# Patient Record
Sex: Male | Born: 1950 | Race: Black or African American | Hispanic: No | Smoking: Former smoker
Health system: Southern US, Community
[De-identification: ages and names within clinical notes are randomized; demographics above are authoritative.]

## PROBLEM LIST (undated history)

## (undated) DIAGNOSIS — C801 Malignant (primary) neoplasm, unspecified: Secondary | ICD-10-CM

## (undated) DIAGNOSIS — N400 Enlarged prostate without lower urinary tract symptoms: Secondary | ICD-10-CM

## (undated) DIAGNOSIS — E559 Vitamin D deficiency, unspecified: Secondary | ICD-10-CM

## (undated) DIAGNOSIS — J432 Centrilobular emphysema: Secondary | ICD-10-CM

## (undated) DIAGNOSIS — K279 Peptic ulcer, site unspecified, unspecified as acute or chronic, without hemorrhage or perforation: Secondary | ICD-10-CM

## (undated) DIAGNOSIS — Z8719 Personal history of other diseases of the digestive system: Secondary | ICD-10-CM

## (undated) DIAGNOSIS — E785 Hyperlipidemia, unspecified: Secondary | ICD-10-CM

## (undated) DIAGNOSIS — E669 Obesity, unspecified: Secondary | ICD-10-CM

## (undated) DIAGNOSIS — F419 Anxiety disorder, unspecified: Secondary | ICD-10-CM

## (undated) DIAGNOSIS — M51369 Other intervertebral disc degeneration, lumbar region without mention of lumbar back pain or lower extremity pain: Secondary | ICD-10-CM

## (undated) DIAGNOSIS — K219 Gastro-esophageal reflux disease without esophagitis: Secondary | ICD-10-CM

## (undated) DIAGNOSIS — I499 Cardiac arrhythmia, unspecified: Secondary | ICD-10-CM

## (undated) DIAGNOSIS — R9439 Abnormal result of other cardiovascular function study: Secondary | ICD-10-CM

## (undated) DIAGNOSIS — E790 Hyperuricemia without signs of inflammatory arthritis and tophaceous disease: Secondary | ICD-10-CM

## (undated) DIAGNOSIS — J45909 Unspecified asthma, uncomplicated: Secondary | ICD-10-CM

## (undated) DIAGNOSIS — R918 Other nonspecific abnormal finding of lung field: Secondary | ICD-10-CM

## (undated) DIAGNOSIS — Z87891 Personal history of nicotine dependence: Secondary | ICD-10-CM

## (undated) DIAGNOSIS — R0600 Dyspnea, unspecified: Secondary | ICD-10-CM

## (undated) DIAGNOSIS — R972 Elevated prostate specific antigen [PSA]: Secondary | ICD-10-CM

## (undated) DIAGNOSIS — A048 Other specified bacterial intestinal infections: Secondary | ICD-10-CM

## (undated) DIAGNOSIS — I2510 Atherosclerotic heart disease of native coronary artery without angina pectoris: Secondary | ICD-10-CM

## (undated) DIAGNOSIS — E876 Hypokalemia: Secondary | ICD-10-CM

## (undated) DIAGNOSIS — I519 Heart disease, unspecified: Secondary | ICD-10-CM

## (undated) DIAGNOSIS — N289 Disorder of kidney and ureter, unspecified: Secondary | ICD-10-CM

## (undated) DIAGNOSIS — E119 Type 2 diabetes mellitus without complications: Secondary | ICD-10-CM

## (undated) DIAGNOSIS — G629 Polyneuropathy, unspecified: Secondary | ICD-10-CM

## (undated) DIAGNOSIS — C3491 Malignant neoplasm of unspecified part of right bronchus or lung: Secondary | ICD-10-CM

## (undated) DIAGNOSIS — R600 Localized edema: Secondary | ICD-10-CM

## (undated) DIAGNOSIS — I10 Essential (primary) hypertension: Secondary | ICD-10-CM

## (undated) DIAGNOSIS — D126 Benign neoplasm of colon, unspecified: Secondary | ICD-10-CM

## (undated) DIAGNOSIS — I351 Nonrheumatic aortic (valve) insufficiency: Secondary | ICD-10-CM

## (undated) DIAGNOSIS — M5136 Other intervertebral disc degeneration, lumbar region: Secondary | ICD-10-CM

## (undated) DIAGNOSIS — M87059 Idiopathic aseptic necrosis of unspecified femur: Secondary | ICD-10-CM

## (undated) DIAGNOSIS — J90 Pleural effusion, not elsewhere classified: Secondary | ICD-10-CM

## (undated) DIAGNOSIS — K769 Liver disease, unspecified: Secondary | ICD-10-CM

## (undated) DIAGNOSIS — R1310 Dysphagia, unspecified: Secondary | ICD-10-CM

## (undated) DIAGNOSIS — G709 Myoneural disorder, unspecified: Secondary | ICD-10-CM

## (undated) DIAGNOSIS — H269 Unspecified cataract: Secondary | ICD-10-CM

## (undated) DIAGNOSIS — K429 Umbilical hernia without obstruction or gangrene: Secondary | ICD-10-CM

## (undated) DIAGNOSIS — K2980 Duodenitis without bleeding: Secondary | ICD-10-CM

## (undated) DIAGNOSIS — N529 Male erectile dysfunction, unspecified: Secondary | ICD-10-CM

## (undated) DIAGNOSIS — J449 Chronic obstructive pulmonary disease, unspecified: Secondary | ICD-10-CM

## (undated) DIAGNOSIS — K2970 Gastritis, unspecified, without bleeding: Secondary | ICD-10-CM

## (undated) DIAGNOSIS — K5790 Diverticulosis of intestine, part unspecified, without perforation or abscess without bleeding: Secondary | ICD-10-CM

## (undated) HISTORY — PX: CYST REMOVAL TRUNK: SHX6283

## (undated) HISTORY — DX: Myoneural disorder, unspecified: G70.9

## (undated) HISTORY — PX: CYSTECTOMY: SHX5119

## (undated) HISTORY — PX: EYE SURGERY: SHX253

## (undated) HISTORY — PX: COLONOSCOPY: SHX174

## (undated) HISTORY — PX: UPPER GI ENDOSCOPY: SHX6162

## (undated) HISTORY — PX: OTHER SURGICAL HISTORY: SHX169

## (undated) HISTORY — DX: Unspecified cataract: H26.9

## (undated) HISTORY — PX: CATARACT EXTRACTION: SUR2

---

## 2009-03-03 ENCOUNTER — Ambulatory Visit: Payer: Self-pay | Admitting: Unknown Physician Specialty

## 2013-02-25 ENCOUNTER — Ambulatory Visit: Payer: Self-pay | Admitting: Physician Assistant

## 2013-03-04 ENCOUNTER — Ambulatory Visit: Payer: Self-pay | Admitting: Family Medicine

## 2013-03-09 ENCOUNTER — Ambulatory Visit: Payer: Self-pay

## 2013-04-22 ENCOUNTER — Ambulatory Visit: Payer: Self-pay | Admitting: Family Medicine

## 2013-04-29 ENCOUNTER — Ambulatory Visit: Payer: Self-pay | Admitting: Family Medicine

## 2013-12-01 ENCOUNTER — Ambulatory Visit: Payer: Self-pay | Admitting: Internal Medicine

## 2014-01-20 DIAGNOSIS — N529 Male erectile dysfunction, unspecified: Secondary | ICD-10-CM | POA: Insufficient documentation

## 2014-01-20 DIAGNOSIS — K279 Peptic ulcer, site unspecified, unspecified as acute or chronic, without hemorrhage or perforation: Secondary | ICD-10-CM | POA: Insufficient documentation

## 2014-01-20 DIAGNOSIS — I519 Heart disease, unspecified: Secondary | ICD-10-CM | POA: Insufficient documentation

## 2014-01-20 DIAGNOSIS — I351 Nonrheumatic aortic (valve) insufficiency: Secondary | ICD-10-CM | POA: Insufficient documentation

## 2014-01-20 DIAGNOSIS — Z87891 Personal history of nicotine dependence: Secondary | ICD-10-CM | POA: Insufficient documentation

## 2014-01-20 DIAGNOSIS — I499 Cardiac arrhythmia, unspecified: Secondary | ICD-10-CM | POA: Insufficient documentation

## 2014-03-06 DIAGNOSIS — Z800 Family history of malignant neoplasm of digestive organs: Secondary | ICD-10-CM | POA: Insufficient documentation

## 2014-03-06 DIAGNOSIS — K219 Gastro-esophageal reflux disease without esophagitis: Secondary | ICD-10-CM | POA: Insufficient documentation

## 2014-03-06 DIAGNOSIS — E1159 Type 2 diabetes mellitus with other circulatory complications: Secondary | ICD-10-CM | POA: Insufficient documentation

## 2014-03-06 DIAGNOSIS — E1169 Type 2 diabetes mellitus with other specified complication: Secondary | ICD-10-CM | POA: Insufficient documentation

## 2014-03-06 DIAGNOSIS — E785 Hyperlipidemia, unspecified: Secondary | ICD-10-CM | POA: Insufficient documentation

## 2014-03-06 DIAGNOSIS — I10 Essential (primary) hypertension: Secondary | ICD-10-CM | POA: Insufficient documentation

## 2014-04-02 DIAGNOSIS — D126 Benign neoplasm of colon, unspecified: Secondary | ICD-10-CM | POA: Insufficient documentation

## 2014-04-02 DIAGNOSIS — R1310 Dysphagia, unspecified: Secondary | ICD-10-CM | POA: Insufficient documentation

## 2014-04-29 ENCOUNTER — Ambulatory Visit: Payer: Self-pay | Admitting: Unknown Physician Specialty

## 2014-07-10 ENCOUNTER — Emergency Department: Payer: Self-pay | Admitting: Emergency Medicine

## 2014-07-10 LAB — CBC WITH DIFFERENTIAL/PLATELET
BASOS PCT: 0.5 %
Basophil #: 0.1 10*3/uL (ref 0.0–0.1)
EOS PCT: 1 %
Eosinophil #: 0.1 10*3/uL (ref 0.0–0.7)
HCT: 47.2 % (ref 40.0–52.0)
HGB: 15.8 g/dL (ref 13.0–18.0)
Lymphocyte #: 2 10*3/uL (ref 1.0–3.6)
Lymphocyte %: 19.8 %
MCH: 31.2 pg (ref 26.0–34.0)
MCHC: 33.6 g/dL (ref 32.0–36.0)
MCV: 93 fL (ref 80–100)
Monocyte #: 0.8 x10 3/mm (ref 0.2–1.0)
Monocyte %: 8.1 %
NEUTROS ABS: 7 10*3/uL — AB (ref 1.4–6.5)
Neutrophil %: 70.6 %
Platelet: 236 10*3/uL (ref 150–440)
RBC: 5.08 10*6/uL (ref 4.40–5.90)
RDW: 13.9 % (ref 11.5–14.5)
WBC: 9.9 10*3/uL (ref 3.8–10.6)

## 2014-07-10 LAB — URINALYSIS, COMPLETE
Bacteria: NONE SEEN
Bilirubin,UR: NEGATIVE
GLUCOSE, UR: NEGATIVE mg/dL (ref 0–75)
Ketone: NEGATIVE
LEUKOCYTE ESTERASE: NEGATIVE
NITRITE: NEGATIVE
PROTEIN: NEGATIVE
Ph: 5 (ref 4.5–8.0)
RBC,UR: 1 /HPF (ref 0–5)
SPECIFIC GRAVITY: 1.012 (ref 1.003–1.030)
Squamous Epithelial: NONE SEEN

## 2014-07-10 LAB — COMPREHENSIVE METABOLIC PANEL
ALT: 19 U/L
ANION GAP: 6 — AB (ref 7–16)
Albumin: 3.4 g/dL (ref 3.4–5.0)
Alkaline Phosphatase: 60 U/L
BUN: 14 mg/dL (ref 7–18)
Bilirubin,Total: 0.5 mg/dL (ref 0.2–1.0)
Calcium, Total: 8.9 mg/dL (ref 8.5–10.1)
Chloride: 101 mmol/L (ref 98–107)
Co2: 31 mmol/L (ref 21–32)
Creatinine: 1.16 mg/dL (ref 0.60–1.30)
EGFR (African American): >60
EGFR (Non-African Amer.): >60
GLUCOSE: 164 mg/dL — AB (ref 65–99)
Osmolality: 280 (ref 275–301)
POTASSIUM: 3.1 mmol/L — AB (ref 3.5–5.1)
SGOT(AST): 22 U/L (ref 15–37)
Sodium: 138 mmol/L (ref 136–145)
TOTAL PROTEIN: 7.4 g/dL (ref 6.4–8.2)

## 2014-07-10 LAB — TROPONIN I

## 2014-10-25 LAB — SURGICAL PATHOLOGY

## 2014-11-11 DIAGNOSIS — I2510 Atherosclerotic heart disease of native coronary artery without angina pectoris: Secondary | ICD-10-CM

## 2014-11-11 HISTORY — DX: Atherosclerotic heart disease of native coronary artery without angina pectoris: I25.10

## 2014-12-16 ENCOUNTER — Ambulatory Visit
Admission: RE | Admit: 2014-12-16 | Discharge: 2014-12-16 | Disposition: A | Discharge status: Home / Self Care | Payer: BLUE CROSS/BLUE SHIELD | Source: Ambulatory Visit | Attending: Cardiology | Admitting: Cardiology

## 2014-12-16 ENCOUNTER — Encounter
Admission: RE | Disposition: A | Discharge status: Home / Self Care | Payer: Self-pay | Source: Ambulatory Visit | Attending: Cardiology

## 2014-12-16 ENCOUNTER — Encounter: Payer: Self-pay | Admitting: *Deleted

## 2014-12-16 DIAGNOSIS — I2510 Atherosclerotic heart disease of native coronary artery without angina pectoris: Secondary | ICD-10-CM | POA: Diagnosis not present

## 2014-12-16 DIAGNOSIS — F17210 Nicotine dependence, cigarettes, uncomplicated: Secondary | ICD-10-CM | POA: Insufficient documentation

## 2014-12-16 DIAGNOSIS — K219 Gastro-esophageal reflux disease without esophagitis: Secondary | ICD-10-CM | POA: Insufficient documentation

## 2014-12-16 DIAGNOSIS — E785 Hyperlipidemia, unspecified: Secondary | ICD-10-CM | POA: Insufficient documentation

## 2014-12-16 DIAGNOSIS — Z791 Long term (current) use of non-steroidal anti-inflammatories (NSAID): Secondary | ICD-10-CM | POA: Insufficient documentation

## 2014-12-16 DIAGNOSIS — E780 Pure hypercholesterolemia: Secondary | ICD-10-CM | POA: Insufficient documentation

## 2014-12-16 DIAGNOSIS — I10 Essential (primary) hypertension: Secondary | ICD-10-CM | POA: Insufficient documentation

## 2014-12-16 DIAGNOSIS — Z79899 Other long term (current) drug therapy: Secondary | ICD-10-CM | POA: Diagnosis not present

## 2014-12-16 DIAGNOSIS — Z7952 Long term (current) use of systemic steroids: Secondary | ICD-10-CM | POA: Insufficient documentation

## 2014-12-16 DIAGNOSIS — R9439 Abnormal result of other cardiovascular function study: Secondary | ICD-10-CM

## 2014-12-16 DIAGNOSIS — R079 Chest pain, unspecified: Secondary | ICD-10-CM

## 2014-12-16 DIAGNOSIS — I351 Nonrheumatic aortic (valve) insufficiency: Secondary | ICD-10-CM | POA: Diagnosis not present

## 2014-12-16 DIAGNOSIS — Z7982 Long term (current) use of aspirin: Secondary | ICD-10-CM | POA: Diagnosis not present

## 2014-12-16 HISTORY — DX: Essential (primary) hypertension: I10

## 2014-12-16 HISTORY — PX: CARDIAC CATHETERIZATION: SHX172

## 2014-12-16 HISTORY — DX: Gastro-esophageal reflux disease without esophagitis: K21.9

## 2014-12-16 HISTORY — DX: Cardiac arrhythmia, unspecified: I49.9

## 2014-12-16 SURGERY — LEFT HEART CATH
Anesthesia: Moderate Sedation

## 2014-12-16 MED ORDER — ACETAMINOPHEN 325 MG PO TABS: 650.0000 mg | ORAL_TABLET | ORAL | Status: DC | PRN

## 2014-12-16 MED ORDER — IOHEXOL 300 MG/ML  SOLN
INTRAMUSCULAR | Status: DC | PRN
  Administered 2014-12-16: 30 mL via INTRAVENOUS
  Administered 2014-12-16: 80 mL via INTRAVENOUS

## 2014-12-16 MED ORDER — SODIUM CHLORIDE 0.9 % IV SOLN
INTRAVENOUS | Status: DC
  Administered 2014-12-16: 09:00:00 via INTRAVENOUS

## 2014-12-16 MED ORDER — FENTANYL CITRATE (PF) 100 MCG/2ML IJ SOLN
INTRAMUSCULAR | Status: DC | PRN
  Administered 2014-12-16: 50 ug via INTRAVENOUS

## 2014-12-16 MED ORDER — MIDAZOLAM HCL 2 MG/2ML IJ SOLN
INTRAMUSCULAR | Status: DC | PRN
Start: 2014-12-16 — End: 2014-12-16
  Administered 2014-12-16: 2 mg via INTRAVENOUS

## 2014-12-16 MED ORDER — ONDANSETRON HCL 4 MG/2ML IJ SOLN: 4.0000 mg | Freq: Four times a day (QID) | INTRAMUSCULAR | Status: DC | PRN

## 2014-12-16 MED ORDER — MIDAZOLAM HCL 2 MG/2ML IJ SOLN
INTRAMUSCULAR | Status: AC
  Filled 2014-12-16: qty 2

## 2014-12-16 MED ORDER — FENTANYL CITRATE (PF) 100 MCG/2ML IJ SOLN
INTRAMUSCULAR | Status: AC
  Filled 2014-12-16: qty 2

## 2014-12-16 SURGICAL SUPPLY — 9 items
CATH INFINITI 5FR ANG PIGTAIL (CATHETERS) ×3 IMPLANT
CATH INFINITI 5FR JL4 (CATHETERS) ×3 IMPLANT
CATH INFINITI JR4 5F (CATHETERS) ×3 IMPLANT
DEVICE CLOSURE MYNXGRIP 5F (Vascular Products) ×3 IMPLANT
KIT MANI 3VAL PERCEP (MISCELLANEOUS) ×3 IMPLANT
NEEDLE PERC 18GX7CM (NEEDLE) ×3 IMPLANT
PACK CARDIAC CATH (CUSTOM PROCEDURE TRAY) ×3 IMPLANT
SHEATH AVANTI 5FR X 11CM (SHEATH) ×3 IMPLANT
WIRE EMERALD 3MM-J .035X150CM (WIRE) ×3 IMPLANT

## 2014-12-16 NOTE — Discharge Instructions (Signed)

## 2014-12-21 NOTE — H&P (Signed)
Progress Notes   Mark Mccarty (MR# [id number])        Progress Notes Info     Author Note Status Last Update User Last Update Date/Time Service Date   Flossie Dibble, MD Signed Flossie Dibble, MD Wed Nov 24, 2014 2:55 PM Wed Nov 24, 2014 2:48 PM    Progress Notes    Expand All Collapse All   Established Patient Visit   Chief Complaint: Chief Complaint  Patient presents with  . Pacer-ICD    ERI 11/09/14   . Shortness of Breath    used to smoke    Date of Service: 11/24/2014 Date of Birth: 01/22/1936 PCP: DAVID Brandy Hale, MD  History of Present Illness: 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 The patient currently has symptoms including dyspnea and fatigue. Patient appears euvolemic.. We have discussed risk factor modification as well as medication management Defib interrogation The patient has had a pacemaker/Defib interrogation which has shown that the battery has reached end of life. We have had further discussion of battery change out with all of the risks and benefits. Otherwise the patient has not had any significant side effects or symptoms of the defib wires. Coronary artery disease The patient has coronary artery disease previously diagnosed by imaging study currently on appropriate medication management including beta-blocker and statin therapy for risk factor reduction including age, male, diabetes, HTN and Hyperlipidemia. The patient appears not to have any significant side effects of treatment at this time and appears stable at this time without evidence of clinical progression of symptoms  Mitral valve disease The patient has  non rheumatic chronic moderate symptomatic mitral valve insufficiency with symptoms including dyspnea and fatigue on treatment including beta-blocker, statin therapy and ASA for treatment of cardiac risk factors worsening with increased severity without need in change in treatment. These mitral valve disease risk factors include hypertension and cardiomyopathy      Past Medical and Surgical History  Past Medical History Past Medical History  Diagnosis Date  . Coronary artery disease   . Cardiomyopathy   . Hypertension   . Hyperlipidemia   . Lower extremity edema   . Diabetes mellitus   . Anemia   . GERD (gastroesophageal reflux disease)   . CHF (congestive heart failure)   . Prostatic stone   . RLS (restless legs syndrome)   . Varicose veins of lower extremity     Past Surgical History He has past surgical history that includes Cardiac catheterization; defobrillator (N/A); TURP vaporization; Insertion Dual Chamber Pacemaker Generator (06/2008); laminectomy cervical for drez; Colonoscopy (05/20/92; 01/13/98; 04/01/03; 04/22/08); egd (02/13/99; 04/22/08); Cholecystectomy (1997); Colonoscopy (01/28/2012); and egd (07/15/2014).   Medications and Allergies  Current Medications   Current Medications    Current Outpatient Prescriptions  Medication Sig Dispense Refill  . ADVOCATE REDI-CODE+ test strip     . aspirin 81 MG EC tablet Take 81 mg by mouth once daily.    [name] atorvastatin (LIPITOR) 10 MG tablet Take 1 tablet (10 mg total) by mouth once daily. 30 tablet 11  . glipiZIDE (GLUCOTROL XL) 10 MG XL tablet Take 1 tablet (10 mg total) by mouth 2 (two) times daily. 180 tablet 1  . HYDROcodone-acetaminophen (NORCO) 5-325 mg tablet Take 1-2 tablets by mouth  twice daily as needed for pain 45 tablet 0  . lancets 30 gauge Misc     . metFORMIN (GLUCOPHAGE) 850 MG tablet Take 1 tablet (850 mg total) by mouth 2 (two)  times daily with meals. 180 tablet 0  . metoprolol succinate (TOPROL-XL) 25 MG XL tablet Take 0.5 tablets (12.5 mg total) by mouth once daily. 30 tablet 0  . omeprazole (PRILOSEC) 20 MG DR capsule Take 1 capsule (20 mg total) by mouth once daily. 30 capsule 11  . pramipexole (MIRAPEX) 0.125 MG tablet Take 1 tablet (0.125 mg total) by mouth 3 (three) times daily. 30 tablet 0  . sildenafil (VIAGRA) 100 MG tablet Take 100 mg by mouth as directed for Erectile Dysfunction.    [name] spironolactone (ALDACTONE) 25 MG tablet Take 1 tablet (25 mg total) by mouth once daily. 90 tablet 0   No current facility-administered medications for this visit.      Allergies: Penicillins  Social and Family History  Social History  reports that he has never smoked. He does not have any smokeless tobacco history on file. He reports that he does not drink alcohol or use illicit drugs.  Family History Family History  Problem Relation Age of Onset  . Asthma Mother   . Heart attack Mother   . Diabetes type II Father   . Asthma Sister   . Asthma Brother   . Diabetes type II Brother   . Asthma Sister   . Heart disease Sister     Review of Systems   Review of Systems  Positive for sob Negative for weight gain weight loss,weakness, vision change, hearing loss, cough, congestion, PND, orthopnea, heartburn, nausea, diaphoresis, vomiting, diarrhea, bloody stool, melena, stomach pain, extremity pain, leg weakness, buttock cramping, leg blood clots, headache, blackouts, nosebleed, trouble swallowing, mouth pain, urinary frequency, urination at night, muscle weakness, skin lesions, skin rashes, tingling , numbness, anxiety, and/or depression Physical Examination   Vitals:BP 110/78 mmHg  Pulse 70  Resp 15  Ht 175.3 cm ('5\' 9"'$ )  Wt 75.751 kg (167 lb)  BMI 24.65 kg/m2 Ht:175.3 cm ('5\' 9"'$ ) Wt:75.751 kg (167 lb) IDP:OEUM surface area is 1.92 meters  squared. Body mass index is 24.65 kg/(m^2). Appearance: well appearing in no acute distress HEENT: Pupils equally reactive to light and accomodation, no apparent xanthalasma  Neck: Supple, no apparent thyromegaly, and or masses Lungs: normal respiratory effort; no wheezes, no rhonchi, no crackles Heart: Regular rate and rhythm. Normal S1 S2 No gallops, murmur, no rub, PMI is normal. carotid upstroke normal without bruit. Jugular venous pressure is normal Abdomen: soft, nontender, with normal bowel sounds. No apparent hepatosplenomegally. Abdominal aorta is normal  Extremities: no edema, no ulcers, no clubbing, no cyanosis Peripheral Pulses: 2+ in upper extremities, 1+ femoral pulses bilaterally, 2+lower extremity  Musculoskeletal; Normal muscle tone without kyphosis Neurological: Cranial nerves intact, oriented to time, place, person  Assessment   64 y.o. male with  Encounter Diagnoses  Name Primary?  . Coronary artery disease involving native coronary artery of native heart without angina pectoris Yes  . Chronic systolic CHF (congestive heart failure), NYHA class 2   . ICD (implantable cardioverter-defibrillator) in place   . Moderate mitral insufficiency         Plan   -The patient is to have consultation and defib battery placement for defib being ERI. The patient understands all risks and benefits of battery replacement. This includes the possibility of death, stroke, heart attack, hemopericardium, pneumothorax, infection, bleeding, blood clot,  and reaction to medications. The patient is at low risk for general anesthesia -Echocardiogram for further evaluation of dyspnea with congestive heart failure, cardiomyopathy, coronary artery disease, valvular heart disease, cardiomegally and pulmonary hypertension  -Continue current medical regimen for hypertension control which is stable at this time and without side effects or symptoms of medications.  Further treatment goals of low sodium diet for additive effects of these medications have been discussed today as well.     No orders of the defined types were placed in this encounter.   No Follow-up on file.  Flossie Dibble, [facility]   [address] [address]    Service Location    Name Address       [place] [address]      Department    Name Address Phone Fax   [facility] [place]  [address] [phone]       Progress Notes   Mark Mccarty (MR# [id number])        Progress Notes Info     Author Note Status Last Update User Last Update Date/Time Service Date   Flossie Dibble, MD Signed Flossie Dibble, MD Wed Nov 24, 2014 2:55 PM Wed Nov 24, 2014 2:48 PM    Progress Notes    Expand All Collapse All   Established Patient Visit   Chief Complaint: Chief Complaint  Patient presents with  . Pacer-ICD    ERI 11/09/14   . Shortness of Breath    used to smoke    Date of Service: 11/24/2014 Date of Birth: 01/22/1936 PCP: DAVID Brandy Hale, MD  History of Present Illness: 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 The patient currently has symptoms including dyspnea and fatigue. Patient appears euvolemic.. We have discussed risk factor modification as well as medication management Defib interrogation The patient has had a pacemaker/Defib interrogation which has shown that the battery has reached end of life. We have had further discussion of battery change out with all of the  risks and benefits. Otherwise the patient has not had any significant side effects or symptoms of the defib wires. Coronary artery disease The patient has coronary artery disease previously diagnosed by imaging study currently on appropriate medication management including beta-blocker and statin therapy for risk factor reduction including age, male, diabetes, HTN and Hyperlipidemia. 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 These mitral valve disease risk factors include hypertension and cardiomyopathy      Past Medical and Surgical History  Past Medical History Past Medical History  Diagnosis Date  . Coronary artery disease   . Cardiomyopathy   . Hypertension   . Hyperlipidemia   . Lower extremity edema   . Diabetes mellitus   .  Anemia   . GERD (gastroesophageal reflux disease)   . CHF (congestive heart failure)   . Prostatic stone   . RLS (restless legs syndrome)   . Varicose veins of lower extremity     Past Surgical History He has past surgical history that includes Cardiac catheterization; defobrillator (N/A); TURP vaporization; Insertion Dual Chamber Pacemaker Generator (06/2008); laminectomy cervical for drez; Colonoscopy (05/20/92; 01/13/98; 04/01/03; 04/22/08); egd (02/13/99; 04/22/08); Cholecystectomy (1997); Colonoscopy (01/28/2012); and egd (07/15/2014).   Medications and Allergies  Current Medications   Current Medications    Current Outpatient Prescriptions  Medication Sig  Dispense Refill  . ADVOCATE REDI-CODE+ test strip     . aspirin 81 MG EC tablet Take 81 mg by mouth once daily.    [name] atorvastatin (LIPITOR) 10 MG tablet Take 1 tablet (10 mg total) by mouth once daily. 30 tablet 11  . glipiZIDE (GLUCOTROL XL) 10 MG XL tablet Take 1 tablet (10 mg total) by mouth 2 (two) times daily. 180 tablet 1  . HYDROcodone-acetaminophen (NORCO) 5-325 mg tablet Take 1-2 tablets by mouth twice daily as needed for pain 45 tablet 0  . lancets 30 gauge Misc     . metFORMIN (GLUCOPHAGE) 850 MG tablet Take 1 tablet (850 mg total) by mouth 2 (two) times daily with meals. 180 tablet 0  . metoprolol succinate (TOPROL-XL) 25 MG XL tablet Take 0.5 tablets (12.5 mg total) by mouth once daily. 30 tablet 0  . omeprazole (PRILOSEC) 20 MG DR capsule Take 1 capsule (20 mg total) by mouth once daily. 30 capsule 11  . pramipexole (MIRAPEX) 0.125 MG tablet Take 1 tablet (0.125 mg total) by mouth 3 (three) times daily. 30 tablet 0  . sildenafil (VIAGRA) 100 MG tablet Take 100 mg by mouth as directed for Erectile Dysfunction.    [name] spironolactone (ALDACTONE) 25 MG tablet Take 1 tablet (25 mg total) by mouth once daily. 90 tablet 0   No current facility-administered medications for this visit.      Allergies: Penicillins  Social and Family History  Social History  reports that he has never smoked. He does not have any smokeless tobacco history on file. He reports that he does not drink alcohol or use illicit drugs.  Family History Family History  Problem Relation Age of Onset  . Asthma Mother   . Heart attack Mother   . Diabetes type II Father   . Asthma Sister   . Asthma Brother   . Diabetes type II Brother   . Asthma Sister   . Heart disease Sister     Review of Systems   Review of Systems  Positive for sob Negative for weight gain weight loss,weakness, vision change,  hearing loss, cough, congestion, PND, orthopnea, heartburn, nausea, diaphoresis, vomiting, diarrhea, bloody stool, melena, stomach pain, extremity pain, leg weakness, buttock cramping, leg blood clots, headache, blackouts, nosebleed, trouble swallowing, mouth pain, urinary frequency, urination at night, muscle weakness, skin lesions, skin rashes, tingling , numbness, anxiety, and/or depression Physical Examination   Vitals:BP 110/78 mmHg  Pulse 70  Resp 15  Ht 175.3 cm ('5\' 9"'$ )  Wt 75.751 kg (167 lb)  BMI 24.65 kg/m2 Ht:175.3 cm ('5\' 9"'$ ) Wt:75.751 kg (167 lb) WJX:BJYN surface area is 1.92 meters squared. Body mass index is 24.65 kg/(m^2). Appearance: well appearing in no acute distress HEENT: Pupils equally reactive to light and accomodation, no apparent xanthalasma  Neck: Supple, no apparent thyromegaly, and or masses Lungs: normal respiratory effort; no wheezes, no rhonchi, no  crackles Heart: Regular rate and rhythm. Normal S1 S2 No gallops, murmur, no rub, PMI is normal. carotid upstroke normal without bruit. Jugular venous pressure is normal Abdomen: soft, nontender, with normal bowel sounds. No apparent hepatosplenomegally. Abdominal aorta is normal  Extremities: no edema, no ulcers, no clubbing, no cyanosis Peripheral Pulses: 2+ in upper extremities, 1+ femoral pulses bilaterally, 2+lower extremity  Musculoskeletal; Normal muscle tone without kyphosis Neurological: Cranial nerves intact, oriented to time, place, person  Assessment   64 y.o. male with  Encounter Diagnoses  Name Primary?  . Coronary artery disease involving native coronary artery of native heart without angina pectoris Yes  . Chronic systolic CHF (congestive heart failure), NYHA class 2   . ICD (implantable cardioverter-defibrillator) in place   . Moderate mitral insufficiency         Plan   -The patient is to have consultation and defib battery placement for defib being ERI.  The patient understands all risks and benefits of battery replacement. This includes the possibility of death, stroke, heart attack, hemopericardium, pneumothorax, infection, bleeding, blood clot, and reaction to medications. The patient is at low risk for general anesthesia -Echocardiogram for further evaluation of dyspnea with congestive heart failure, cardiomyopathy, coronary artery disease, valvular heart disease, cardiomegally and pulmonary hypertension  -Continue current medical regimen for hypertension control which is stable at this time and without side effects or symptoms of medications. Further treatment goals of low sodium diet for additive effects of these medications have been discussed today as well.     No orders of the defined types were placed in this encounter.   No Follow-up on file.  Flossie Dibble, [facility]   [address] [place] [address]    Service Location    Name Address       [place] [address]      Department    Name Address Phone Fax   [facility] [place] [address] [phone]

## 2014-12-23 DIAGNOSIS — Z9889 Other specified postprocedural states: Secondary | ICD-10-CM | POA: Insufficient documentation

## 2015-09-12 DIAGNOSIS — Z79899 Other long term (current) drug therapy: Secondary | ICD-10-CM | POA: Insufficient documentation

## 2016-05-02 DIAGNOSIS — E1169 Type 2 diabetes mellitus with other specified complication: Secondary | ICD-10-CM | POA: Insufficient documentation

## 2016-05-02 DIAGNOSIS — E790 Hyperuricemia without signs of inflammatory arthritis and tophaceous disease: Secondary | ICD-10-CM | POA: Insufficient documentation

## 2016-10-04 DIAGNOSIS — I519 Heart disease, unspecified: Secondary | ICD-10-CM

## 2016-10-04 HISTORY — DX: Heart disease, unspecified: I51.9

## 2016-12-04 DIAGNOSIS — R600 Localized edema: Secondary | ICD-10-CM | POA: Insufficient documentation

## 2017-09-14 DIAGNOSIS — Z2839 Other underimmunization status: Secondary | ICD-10-CM | POA: Insufficient documentation

## 2017-10-02 DIAGNOSIS — R0602 Shortness of breath: Secondary | ICD-10-CM | POA: Insufficient documentation

## 2017-10-25 ENCOUNTER — Other Ambulatory Visit: Payer: Self-pay | Admitting: Specialist

## 2017-10-25 DIAGNOSIS — R911 Solitary pulmonary nodule: Secondary | ICD-10-CM

## 2017-10-30 ENCOUNTER — Ambulatory Visit
Admission: RE | Admit: 2017-10-30 | Discharge: 2017-10-30 | Disposition: A | Discharge status: Home / Self Care | Payer: Medicare Other | Source: Ambulatory Visit | Attending: Specialist | Admitting: Specialist

## 2017-10-30 DIAGNOSIS — R0602 Shortness of breath: Secondary | ICD-10-CM | POA: Diagnosis present

## 2017-10-30 DIAGNOSIS — R911 Solitary pulmonary nodule: Secondary | ICD-10-CM | POA: Diagnosis present

## 2017-10-30 DIAGNOSIS — R918 Other nonspecific abnormal finding of lung field: Secondary | ICD-10-CM | POA: Insufficient documentation

## 2017-11-01 ENCOUNTER — Other Ambulatory Visit: Payer: Self-pay | Admitting: Specialist

## 2017-11-01 ENCOUNTER — Other Ambulatory Visit (HOSPITAL_COMMUNITY): Payer: Self-pay | Admitting: Specialist

## 2017-11-01 DIAGNOSIS — R0602 Shortness of breath: Secondary | ICD-10-CM

## 2017-11-01 DIAGNOSIS — R911 Solitary pulmonary nodule: Secondary | ICD-10-CM

## 2017-11-12 ENCOUNTER — Encounter
Admission: RE | Admit: 2017-11-12 | Discharge: 2017-11-12 | Disposition: A | Discharge status: Home / Self Care | Payer: Medicare Other | Source: Ambulatory Visit | Attending: Specialist | Admitting: Specialist

## 2017-11-12 DIAGNOSIS — R911 Solitary pulmonary nodule: Secondary | ICD-10-CM | POA: Insufficient documentation

## 2017-11-12 DIAGNOSIS — R0602 Shortness of breath: Secondary | ICD-10-CM | POA: Insufficient documentation

## 2017-11-12 LAB — GLUCOSE, CAPILLARY: Glucose-Capillary: 98 mg/dL (ref 65–99)

## 2017-11-12 MED ORDER — FLUDEOXYGLUCOSE F - 18 (FDG) INJECTION
10.9000 | Freq: Once | INTRAVENOUS | Status: AC
  Administered 2017-11-12: 10.9 via INTRAVENOUS

## 2017-11-26 ENCOUNTER — Encounter: Payer: Self-pay | Admitting: *Deleted

## 2017-11-26 NOTE — Progress Notes (Signed)
  Oncology Nurse Navigator Documentation  Navigator Location: [facility] (11/26/17 1500) Referral date to RadOnc/MedOnc: 11/26/17 (11/26/17 1500) )Navigator Encounter Type: Introductory phone call (11/26/17 1500)   Abnormal Finding Date: 10/30/17 (11/26/17 1500)                     Barriers/Navigation Needs: Coordination of Care (11/26/17 1500)   Interventions: Coordination of Care (11/26/17 1500)   Coordination of Care: Appts (11/26/17 1500)        Acuity: Level 2 (11/26/17 1500)   Acuity Level 2: Initial guidance, education and coordination as needed;Educational needs;Assistance expediting appointments (11/26/17 1500)    phone call made to patient to introduce to navigator services and give new appt information. Pt scheduled in the lung multidisciplinary clinic on Friday 11/29/2017 at [facility] in [place]. Advised pt that can have future appts scheduled in [place] if desired. All questions answered at the time of call. Pt confirmed appt. Contact info given and instructed to call with any further questions or concerns. Time Spent with Patient: 30 (11/26/17 1500)

## 2017-11-28 ENCOUNTER — Telehealth: Payer: Self-pay | Admitting: *Deleted

## 2017-11-28 DIAGNOSIS — R918 Other nonspecific abnormal finding of lung field: Secondary | ICD-10-CM

## 2017-11-28 NOTE — Telephone Encounter (Signed)
Pt reviewed at case conference today. Recommendation was to refer to pulmonary for bronchoscopy to obtain tissue diagnosis. Per Dr. Tasia Catchings, will go ahead and order brain mri as well. Message sent to scheduling and pt will be notified of appts tomorrow morning while in clinic.

## 2017-11-29 ENCOUNTER — Inpatient Hospital Stay: Payer: Medicare Other | Attending: Oncology | Admitting: Oncology

## 2017-11-29 ENCOUNTER — Encounter: Payer: Self-pay | Admitting: *Deleted

## 2017-11-29 ENCOUNTER — Encounter: Payer: Self-pay | Admitting: Oncology

## 2017-11-29 VITALS — BP 166/89 | HR 76 | Temp 97.6°F | Resp 18 | Ht 72.0 in | Wt 206.2 lb

## 2017-11-29 DIAGNOSIS — Z803 Family history of malignant neoplasm of breast: Secondary | ICD-10-CM | POA: Diagnosis not present

## 2017-11-29 DIAGNOSIS — R918 Other nonspecific abnormal finding of lung field: Secondary | ICD-10-CM

## 2017-11-29 DIAGNOSIS — Z801 Family history of malignant neoplasm of trachea, bronchus and lung: Secondary | ICD-10-CM

## 2017-11-29 DIAGNOSIS — Z800 Family history of malignant neoplasm of digestive organs: Secondary | ICD-10-CM | POA: Diagnosis not present

## 2017-11-29 DIAGNOSIS — Z87891 Personal history of nicotine dependence: Secondary | ICD-10-CM | POA: Diagnosis not present

## 2017-11-29 NOTE — Progress Notes (Signed)
  Oncology Nurse Navigator Documentation  Navigator Location: [facility] (11/29/17 1300)   )Navigator Encounter Type: Clinic/MDC (11/29/17 1300)               Multidisiplinary Clinic Date: 11/29/17 (11/29/17 1300) Multidisiplinary Clinic Type: Thoracic (11/29/17 1300)   Patient Visit Type: MedOnc (11/29/17 1300) Treatment Phase: Abnormal Scans (11/29/17 1300) Barriers/Navigation Needs: Coordination of Care (11/29/17 1300)   Interventions: Coordination of Care (11/29/17 1300)   Coordination of Care: Appts;Radiology (11/29/17 1300)       met with patient during initial consultation in the thoracic multidisciplinary clinic. Dr. Tasia Catchings reviewed imaging with patient and discussed need for biopsy as next step. Reviewed with patient that will be referred to pulmonary to proceed with bronchoscopy to obtain tissue diagnosis. Reviewed appts with Dr. Ashby Dawes on 6/4 at 11am and brain mri scheduled on 6/11 at 6pm. Contact info given and encouraged pt to call if has any further questions or concerns. Pt informed that will be scheduled for follow up at the cancer center a few days after bronchoscopy. Pt verbalized understanding. Nothing further needed at this time.            Time Spent with Patient: 60 (11/29/17 1300)

## 2017-11-29 NOTE — Progress Notes (Signed)
Hematology/Oncology Consult note [facility] Telephone:([phone] [phone]   Patient Care Team: [name], MD as PCP - General (Internal Medicine) [name], RN as Registered Nurse  REFERRING PROVIDER: Dr.Fleming CHIEF COMPLAINTS/PURPOSE OF CONSULTATION:  Evaluation of lung mass.   HISTORY OF PRESENTING ILLNESS:  Mark Mccarty is a  67 y.o.  male with PMH listed below who was referred to me for evaluation of lung mass Patient reports feeling shortness of breath on exertion and he went to Dr. Raul Del pulmonology for evaluation.  He is an ex-smoker, quit 2 years ago.  Smoked about 43 years 1 pack to 1.5 pack a day. 10/30/2017 CT chest without contrast showed irregular right upper lobe nodule 2.6 x 2.1 cm touches pleural surface. There is also right hilar in the right paratracheal ipsilateral nodal metastasis. 11/12/2017 PET scan showed hyper metabolic right upper lobe nodule abutting the pleural surface most consistent with primary bronchogenic carcinoma.  Ipsilateral right hilar and mediastinal metastatic adenopathy. There are 2 irregular nodules in the left upper lobe which have very very mild metabolic activity.  Too small to characterize remain indeterminate.  Today patient reports feeling anxious about possible cancer diagnosis.  Denies any weight loss chest pain, leg swelling, hemoptysis, cough. 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  [name]: Negative for congestion, ear discharge, ear pain, nosebleeds, sinus pain and sore throat.   Eyes: Negative for double vision, photophobia, pain, discharge and redness.  Respiratory: Positive for shortness of breath. Negative for cough, hemoptysis, sputum production and wheezing.   Cardiovascular: Negative for chest pain, palpitations, orthopnea, claudication and leg swelling.  Gastrointestinal: Negative for  abdominal pain, blood in stool, constipation, diarrhea, heartburn, melena, nausea and vomiting.  Genitourinary: Negative for dysuria, flank pain, frequency and hematuria.  Musculoskeletal: Negative for back pain, myalgias and neck pain.  Skin: Negative for itching and rash.  Neurological: Negative for dizziness, tingling, tremors, focal weakness, weakness and headaches.  Endo/Heme/Allergies: Negative for environmental allergies. Does not bruise/bleed easily.  Psychiatric/Behavioral: Negative for depression and hallucinations. The patient is not nervous/anxious.     MEDICAL HISTORY:  Past Medical History:  Diagnosis Date  . Cataract    left cataract surgery  . [name]   . GERD (gastroesophageal reflux disease)   . Hypertension   . Neuromuscular disorder ([place])    pt has knees bilateral with tendon issues that cause pain    SURGICAL HISTORY: Past Surgical History:  Procedure Laterality Date  . CARDIAC CATHETERIZATION N/A 12/16/2014   Procedure: Left Heart Cath;  Surgeon: [name], MD;  Location: [facility];  Service: Cardiovascular;  Laterality: N/A;  . CYST REMOVAL TRUNK     chest and back over time and it was removed  . left cataract surgery      SOCIAL HISTORY: Social History   Socioeconomic History  . Marital status: Divorced    Spouse name: Not on file  . Number of children: Not on file  . Years of education: Not on file  . Highest education level: Not on file  Occupational History  . Not on file  Social Needs  . Financial resource strain: Not on file  . Food insecurity:    Worry: Not on file    Inability: Not on file  . Transportation needs:    Medical: Not on file    Non-medical: Not on file  Tobacco Use  . Smoking  status: Former Smoker    Packs/day: 1.50    Years: 35.00    Pack years: 52.50    Types: Cigarettes    Last attempt to quit: 06/22/2015    Years since quitting: 2.4  . Smokeless tobacco: Never Used  Substance and Sexual  Activity  . Alcohol use: Yes    Alcohol/week: 1.8 oz    Types: 3 Shots of liquor per week  . Drug use: No  . Sexual activity: Not Currently  Lifestyle  . Physical activity:    Days per week: Not on file    Minutes per session: Not on file  . Stress: Not on file  Relationships  . Social connections:    Talks on phone: Not on file    Gets together: Not on file    Attends religious service: Not on file    Active member of club or organization: Not on file    Attends meetings of clubs or organizations: Not on file    Relationship status: Not on file  . Intimate partner violence:    Fear of current or ex partner: Not on file    Emotionally abused: Not on file    Physically abused: Not on file    Forced sexual activity: Not on file  Other Topics Concern  . Not on file  Social History Narrative  . Not on file    FAMILY HISTORY: Family History  Problem Relation Age of Onset  . Hypertension Mother   . Breast cancer Mother   . Heart attack Mother   . Lung cancer Father   . Heart disease Sister   . Diabetes Sister   . Colon cancer Sister     ALLERGIES:  is allergic to penicillins.  MEDICATIONS:  Current Outpatient Medications  Medication Sig Dispense Refill  . aspirin 81 MG tablet Take 81 mg by mouth daily.    . carvedilol (COREG) 25 MG tablet Take 25 mg by mouth 2 (two) times daily with a meal.    . chlorhexidine (PERIDEX) 0.12 % solution Use as directed 15 mLs in the mouth or throat 2 (two) times daily.    [name] gabapentin (NEURONTIN) 300 MG capsule Take 300 mg by mouth at bedtime.    [name] ibuprofen (ADVIL,MOTRIN) 600 MG tablet Take 600 mg by mouth every 6 (six) hours as needed.    [name] lisinopril (PRINIVIL,ZESTRIL) 40 MG tablet Take 40 mg by mouth daily.    [name] triamterene-hydrochlorothiazide (DYAZIDE) 37.5-25 MG per capsule Take 1 capsule by mouth daily.    [name] HYDROcodone-acetaminophen (NORCO/VICODIN) 5-325 MG per tablet Take 1 tablet by mouth every 6 (six) hours as needed for  moderate pain.    [name] ketorolac (ACULAR) 0.4 % SOLN 1 drop 4 (four) times daily.    [name] omeprazole (PRILOSEC) 20 MG capsule Take 20 mg by mouth 2 (two) times daily before a meal.    . potassium chloride (K-DUR,KLOR-CON) 10 MEQ tablet Take 10 mEq by mouth daily.    . prednisoLONE sodium phosphate (INFLAMASE FORTE) 1 % ophthalmic solution Place 1 drop into the left eye every 4 (four) hours while awake.    . sildenafil (VIAGRA) 100 MG tablet Take 100 mg by mouth as needed for erectile dysfunction.    [name] tobramycin (TOBREX) 0.3 % ophthalmic ointment 1 application 3 (three) times daily.     No current facility-administered medications for this visit.      PHYSICAL EXAMINATION: ECOG PERFORMANCE STATUS: 0 - Asymptomatic Vitals:   11/29/17 1701  BP: (!) 166/89  Pulse: 76  Resp: 18  Temp: 97.6 F (36.4 C)   Filed Weights   11/29/17 1701  Weight: 206 lb 3.2 oz (93.5 kg)    Physical Exam  Constitutional: He is oriented to person, place, and time. He appears well-developed and well-nourished. No distress.  [name]:  Head: Normocephalic and atraumatic.  Right Ear: External ear normal.  Left Ear: External ear normal.  Mouth/Throat: Oropharynx is clear and moist.  Eyes: Pupils are equal, round, and reactive to light. Conjunctivae and EOM are normal. No scleral icterus.  Neck: Normal range of motion. Neck supple.  Cardiovascular: Normal rate, regular rhythm and normal heart sounds.  Pulmonary/Chest: Effort normal. No respiratory distress. He has no wheezes. He has no rales. He exhibits no tenderness.  Decreased breath sounds bilaterally  Abdominal: Soft. Bowel sounds are normal. He exhibits no distension and no mass. There is no tenderness.  Musculoskeletal: Normal range of motion. He exhibits no edema or deformity.  Lymphadenopathy:    He has no cervical adenopathy.  Neurological: He is alert and oriented to person, place, and time. No cranial nerve deficit. Coordination normal.  Skin: Skin is  warm and dry. No rash noted.  Psychiatric: He has a normal mood and affect. His behavior is normal. Thought content normal.     LABORATORY DATA:  I have reviewed the data as listed Lab Results  Component Value Date   WBC 9.9 07/10/2014   HGB 15.8 07/10/2014   HCT 47.2 07/10/2014   MCV 93 07/10/2014   PLT 236 07/10/2014   No results for input(s): NA, K, CL, CO2, GLUCOSE, BUN, CREATININE, CALCIUM, GFRNONAA, GFRAA, PROT, ALBUMIN, AST, ALT, ALKPHOS, BILITOT, BILIDIR, IBILI in the last 8760 hours.     ASSESSMENT & PLAN:  1. Lung mass    # Images including CT and PET scan were independently reviewed and discussed in detail with patient.  Highly suspicious for primary lung cancer.  Discussed with patient about obtaining tissue diagnosis.  His case was discussed on tumor board on Nov 28, 2017 and consensus recommendation was to obtain tissue biopsy via bronchoscopy. Will refer to pulmonology for evaluation of bronchoscopy and biopsy. Obtain brain MRI to complete staging.  All questions were answered. The patient knows to call the clinic with any problems questions or concerns.  Return of visit: after biopsy pathology resulted. TBD. Thank you for this kind referral and the opportunity to participate in the care of this patient. A copy of today's note is routed to referring provider  Total face to face encounter time for this patient visit was 34min. >50% of the time was  spent in counseling and coordination of care.    [name], MD, PhD Hematology Oncology [facility] Pager- [phone] 11/29/2017

## 2017-11-29 NOTE — Progress Notes (Signed)
Pt here for lung nodule-he has been sob on primary exertion and it got worse and that is why he is now here.

## 2017-12-02 NOTE — H&P (View-Only) (Signed)
[facility] Consultation      Assessment and Plan:  Lung mass with mediastinal lymphadenopathy. - Patient has a 2 cm apical lung mass, with right paratracheal and right hilar lymphadenopathy. - Doubtful that the 2 cm apical mass could be reached bronchoscopically, and needle biopsy would present a relatively high risk given the severe emphysema. -We will therefore plan for EBUS bronchoscopy of lymph nodes, if unrevealing, will consider referral for CT-guided lung biopsy.  COPD with severe apical/bullous emphysema. - Symptoms appear to be controlled with current regimen, will continue. -Patient will continue to follow-up with Dr. Raul Del.  Return about 1 week after bronchoscopy.    Date: 12/03/2017  MRN# [id number] Mark Mccarty [birth year]  Mark Mccarty is a 67 y.o. old male seen in consultation for chief complaint of:    Chief Complaint  Patient presents with  . Consult    Referred by Dr. Tasia Catchings for evaluation right side lung mass  . Shortness of Breath    with exertion; pt denies cough, wheezing and or chest pain/tightness.    HPI:   The patient is a 67 year old male, who was recently seen in consultation by Dr. Raul Del for dyspnea.  He underwent pulmonary function testing which showed COPD with an FEV1 of 64% predicted.  He was also sent for imaging which was abnormal.  He used to work as a Pharmacist, community and smoked, stopped about 2.5 yrs ago. He does yard at home, he does not do heavier work because he gets winded. He uses an Anoro inhaler once daily, not sure if it helps. He uses xopenex once per day also not sure if it helps.   **Imaging personally reviewed, chest x-ray 4/23 CT chest 10/30/2017, as well as PET scan 5/14 CT chest showed a 2 cm right upper lobe apical segment nodule with SUV max of 9.2, severe apical bullous emphysema right paratracheal lymphadenopathy SUV of 5.6, as well as right hilar lymphadenopathy SUV of 9.6.  **Echocardiogram  10/11/2017; INTERPRETATION LOW-NORMAL LEFT VENTRICULAR SYSTOLIC FUNCTION WITH AN ESTIMATED EF = 45-50 % NORMAL RIGHT VENTRICULAR SYSTOLIC FUNCTION MILD-TO-MODERATE MITRAL AND AORTIC VALVE INSUFFICIENCY MILD TRICUSPID VALVE INSUFFICIENCY NO VALVULAR STENOSIS  **Pulmonary function testing 10/30/2017 Diagnostics: SPIROMETRY: FVC was 3.17 liters, 83% of predicted/Post 2.95, 77%, -7% Change FEV1 was 1.91, 64% of predicted/Post 1.79, 60%, -6% Change FEV1 ratio was 60/Post 61 FEF 25-75% liters per second was 20% of predicted/Post 20%, -3% Change SMALL VOLUME NEBULIZER given with 2.5 mg Albuterol for Post Spirometry.  LUNG VOLUMES: TLC was 77% of predicted RV was 87% of predicted  DIFFUSION CAPACITY: DLCO was 52% of predicted DLCO/VA was 98% of predicted  FLOW VOLUME LOOP: Expiratory flow volume is c/w obstruction ( delayed)  Impression Spirometry is c/w moderate obstruction/copd TLC is slightly decreased DLCO is moderate - severely decreased. DLCO/VA is in normal range     PMHX:   Past Medical History:  Diagnosis Date  . Cataract    left cataract surgery  . [name]   . GERD (gastroesophageal reflux disease)   . Hypertension   . Neuromuscular disorder ([place])    pt has knees bilateral with tendon issues that cause pain   Surgical Hx:  Past Surgical History:  Procedure Laterality Date  . CARDIAC CATHETERIZATION N/A 12/16/2014   Procedure: Left Heart Cath;  Surgeon: [name], MD;  Location: [facility];  Service: Cardiovascular;  Laterality: N/A;  . CYST REMOVAL TRUNK     chest and back over time and  it was removed  . left cataract surgery     Family Hx:  Family History  Problem Relation Age of Onset  . Hypertension Mother   . Breast cancer Mother   . Heart attack Mother   . Lung cancer Father   . Heart disease Sister   . Diabetes Sister   . Colon cancer Sister    Social Hx:   Social History   Tobacco Use  . Smoking status: Former Smoker     Packs/day: 1.50    Years: 35.00    Pack years: 52.50    Types: Cigarettes    Last attempt to quit: 06/22/2015    Years since quitting: 2.4  . Smokeless tobacco: Never Used  Substance Use Topics  . Alcohol use: Yes    Alcohol/week: 1.8 oz    Types: 3 Shots of liquor per week  . Drug use: No   Medication:    Current Outpatient Medications:  .  acetaminophen (TYLENOL) 500 MG tablet, Take 500 mg by mouth as needed., Disp: , Rfl:  .  albuterol (PROVENTIL HFA;VENTOLIN HFA) 108 (90 Base) MCG/ACT inhaler, Inhale 2 puffs into the lungs every 6 (six) hours as needed., Disp: , Rfl: 12 .  amLODipine (NORVASC) 10 MG tablet, Take 10 mg by mouth daily., Disp: , Rfl:  .  ANORO ELLIPTA 62.5-25 MCG/INH AEPB, Inhale 1 puff into the lungs daily., Disp: , Rfl: 6 .  aspirin 81 MG tablet, Take 81 mg by mouth daily., Disp: , Rfl:  .  carvedilol (COREG) 25 MG tablet, Take 25 mg by mouth 2 (two) times daily with a meal., Disp: , Rfl:  .  gabapentin (NEURONTIN) 300 MG capsule, Take 300 mg by mouth at bedtime., Disp: , Rfl:  .  ibuprofen (ADVIL,MOTRIN) 600 MG tablet, Take 600 mg by mouth every 6 (six) hours as needed., Disp: , Rfl:  .  lisinopril (PRINIVIL,ZESTRIL) 40 MG tablet, Take 40 mg by mouth daily., Disp: , Rfl:  .  omeprazole (PRILOSEC) 20 MG capsule, Take 20 mg by mouth 2 (two) times daily before a meal., Disp: , Rfl:  .  sildenafil (VIAGRA) 100 MG tablet, Take 100 mg by mouth as needed for erectile dysfunction., Disp: , Rfl:  .  tamsulosin (FLOMAX) 0.4 MG CAPS capsule, Take 0.4 mg by mouth daily., Disp: , Rfl:  .  potassium chloride (K-DUR,KLOR-CON) 10 MEQ tablet, Take 10 mEq by mouth daily., Disp: , Rfl:    Allergies:  Penicillins  Review of Systems: Gen:  Denies  fever, sweats, chills HEENT: Denies blurred vision, double vision. bleeds, sore throat Cvc:  No dizziness, chest pain. Resp:   Denies cough or sputum production, shortness of breath Gi: Denies swallowing difficulty, stomach  pain. Gu:  Denies bladder incontinence, burning urine Ext:   No Joint pain, stiffness. Skin: No skin rash,  hives  Endoc:  No polyuria, polydipsia. Psych: No depression, insomnia. Other:  All other systems were reviewed with the patient and were negative other that what is mentioned in the HPI.   Physical Examination:   VS: Ht 6' (1.829 m)   BMI 27.97 kg/m   General Appearance: No distress  Neuro:without focal findings,  speech normal,  HEENT: PERRLA, EOM intact.   Pulmonary: normal breath sounds, No wheezing.  CardiovascularNormal S1,S2.  No m/r/g.   Abdomen: Benign, Soft, non-tender. Renal:  No costovertebral tenderness  GU:  No performed at this time. Endoc: No evident thyromegaly, no signs of acromegaly. Skin:   warm, no  rashes, no ecchymosis  Extremities: normal, no cyanosis, clubbing.  Other findings:    LABORATORY PANEL:   CBC No results for input(s): WBC, HGB, HCT, PLT in the last 168 hours. ------------------------------------------------------------------------------------------------------------------  Chemistries  No results for input(s): NA, K, CL, CO2, GLUCOSE, BUN, CREATININE, CALCIUM, MG, AST, ALT, ALKPHOS, BILITOT in the last 168 hours.  Invalid input(s): GFRCGP ------------------------------------------------------------------------------------------------------------------  Cardiac Enzymes No results for input(s): TROPONINI in the last 168 hours. ------------------------------------------------------------  RADIOLOGY:  No results found.     Thank  you for the consultation and for allowing [facility] to assist in the care of your patient. Our recommendations are noted above.  Please contact us if we can be of further service.   Marda Stalker, MD.  Board Certified in Internal Medicine, Pulmonary Medicine, [place], and Sleep Medicine.  [facility] Office Number: [phone]  Patricia Pesa, M.D.  Merton Border, M.D  12/03/2017

## 2017-12-02 NOTE — Progress Notes (Signed)
[facility] Consultation      Assessment and Plan:  Lung mass with mediastinal lymphadenopathy. - Patient has a 2 cm apical lung mass, with right paratracheal and right hilar lymphadenopathy. - Doubtful that the 2 cm apical mass could be reached bronchoscopically, and needle biopsy would present a relatively high risk given the severe emphysema. -We will therefore plan for EBUS bronchoscopy of lymph nodes, if unrevealing, will consider referral for CT-guided lung biopsy.  COPD with severe apical/bullous emphysema. - Symptoms appear to be controlled with current regimen, will continue. -Patient will continue to follow-up with Dr. Raul Del.  Return about 1 week after bronchoscopy.    Date: 12/03/2017  MRN# [id number] Mark Mccarty 12/31/1950  Mark Mccarty is a 67 y.o. old male seen in consultation for chief complaint of:    Chief Complaint  Patient presents with  . Consult    Referred by Dr. Tasia Catchings for evaluation right side lung mass  . Shortness of Breath    with exertion; pt denies cough, wheezing and or chest pain/tightness.    HPI:   The patient is a 67 year old male, who was recently seen in consultation by Dr. Raul Del for dyspnea.  He underwent pulmonary function testing which showed COPD with an FEV1 of 64% predicted.  He was also sent for imaging which was abnormal.  He used to work as a Pharmacist, community and smoked, stopped about 2.5 yrs ago. He does yard at home, he does not do heavier work because he gets winded. He uses an Anoro inhaler once daily, not sure if it helps. He uses xopenex once per day also not sure if it helps.   **Imaging personally reviewed, chest x-ray 4/23 CT chest 10/30/2017, as well as PET scan 5/14 CT chest showed a 2 cm right upper lobe apical segment nodule with SUV max of 9.2, severe apical bullous emphysema right paratracheal lymphadenopathy SUV of 5.6, as well as right hilar lymphadenopathy SUV of 9.6.  **Echocardiogram  10/11/2017; INTERPRETATION LOW-NORMAL LEFT VENTRICULAR SYSTOLIC FUNCTION WITH AN ESTIMATED EF = 45-50 % NORMAL RIGHT VENTRICULAR SYSTOLIC FUNCTION MILD-TO-MODERATE MITRAL AND AORTIC VALVE INSUFFICIENCY MILD TRICUSPID VALVE INSUFFICIENCY NO VALVULAR STENOSIS  **Pulmonary function testing 10/30/2017 Diagnostics: SPIROMETRY: FVC was 3.17 liters, 83% of predicted/Post 2.95, 77%, -7% Change FEV1 was 1.91, 64% of predicted/Post 1.79, 60%, -6% Change FEV1 ratio was 60/Post 61 FEF 25-75% liters per second was 20% of predicted/Post 20%, -3% Change SMALL VOLUME NEBULIZER given with 2.5 mg Albuterol for Post Spirometry.  LUNG VOLUMES: TLC was 77% of predicted RV was 87% of predicted  DIFFUSION CAPACITY: DLCO was 52% of predicted DLCO/VA was 98% of predicted  FLOW VOLUME LOOP: Expiratory flow volume is c/w obstruction ( delayed)  Impression Spirometry is c/w moderate obstruction/copd TLC is slightly decreased DLCO is moderate - severely decreased. DLCO/VA is in normal range     PMHX:   Past Medical History:  Diagnosis Date  . Cataract    left cataract surgery  . [name]   . GERD (gastroesophageal reflux disease)   . Hypertension   . Neuromuscular disorder ([place])    pt has knees bilateral with tendon issues that cause pain   Surgical Hx:  Past Surgical History:  Procedure Laterality Date  . CARDIAC CATHETERIZATION N/A 12/16/2014   Procedure: Left Heart Cath;  Surgeon: [name], MD;  Location: [facility];  Service: Cardiovascular;  Laterality: N/A;  . CYST REMOVAL TRUNK     chest and back over time and  it was removed  . left cataract surgery     Family Hx:  Family History  Problem Relation Age of Onset  . Hypertension Mother   . Breast cancer Mother   . Heart attack Mother   . Lung cancer Father   . Heart disease Sister   . Diabetes Sister   . Colon cancer Sister    Social Hx:   Social History   Tobacco Use  . Smoking status: Former Smoker     Packs/day: 1.50    Years: 35.00    Pack years: 52.50    Types: Cigarettes    Last attempt to quit: 06/22/2015    Years since quitting: 2.4  . Smokeless tobacco: Never Used  Substance Use Topics  . Alcohol use: Yes    Alcohol/week: 1.8 oz    Types: 3 Shots of liquor per week  . Drug use: No   Medication:    Current Outpatient Medications:  .  acetaminophen (TYLENOL) 500 MG tablet, Take 500 mg by mouth as needed., Disp: , Rfl:  .  albuterol (PROVENTIL HFA;VENTOLIN HFA) 108 (90 Base) MCG/ACT inhaler, Inhale 2 puffs into the lungs every 6 (six) hours as needed., Disp: , Rfl: 12 .  amLODipine (NORVASC) 10 MG tablet, Take 10 mg by mouth daily., Disp: , Rfl:  .  ANORO ELLIPTA 62.5-25 MCG/INH AEPB, Inhale 1 puff into the lungs daily., Disp: , Rfl: 6 .  aspirin 81 MG tablet, Take 81 mg by mouth daily., Disp: , Rfl:  .  carvedilol (COREG) 25 MG tablet, Take 25 mg by mouth 2 (two) times daily with a meal., Disp: , Rfl:  .  gabapentin (NEURONTIN) 300 MG capsule, Take 300 mg by mouth at bedtime., Disp: , Rfl:  .  ibuprofen (ADVIL,MOTRIN) 600 MG tablet, Take 600 mg by mouth every 6 (six) hours as needed., Disp: , Rfl:  .  lisinopril (PRINIVIL,ZESTRIL) 40 MG tablet, Take 40 mg by mouth daily., Disp: , Rfl:  .  omeprazole (PRILOSEC) 20 MG capsule, Take 20 mg by mouth 2 (two) times daily before a meal., Disp: , Rfl:  .  sildenafil (VIAGRA) 100 MG tablet, Take 100 mg by mouth as needed for erectile dysfunction., Disp: , Rfl:  .  tamsulosin (FLOMAX) 0.4 MG CAPS capsule, Take 0.4 mg by mouth daily., Disp: , Rfl:  .  potassium chloride (K-DUR,KLOR-CON) 10 MEQ tablet, Take 10 mEq by mouth daily., Disp: , Rfl:    Allergies:  Penicillins  Review of Systems: Gen:  Denies  fever, sweats, chills HEENT: Denies blurred vision, double vision. bleeds, sore throat Cvc:  No dizziness, chest pain. Resp:   Denies cough or sputum production, shortness of breath Gi: Denies swallowing difficulty, stomach  pain. Gu:  Denies bladder incontinence, burning urine Ext:   No Joint pain, stiffness. Skin: No skin rash,  hives  Endoc:  No polyuria, polydipsia. Psych: No depression, insomnia. Other:  All other systems were reviewed with the patient and were negative other that what is mentioned in the HPI.   Physical Examination:   VS: Ht 6' (1.829 m)   BMI 27.97 kg/m   General Appearance: No distress  Neuro:without focal findings,  speech normal,  HEENT: PERRLA, EOM intact.   Pulmonary: normal breath sounds, No wheezing.  CardiovascularNormal S1,S2.  No m/r/g.   Abdomen: Benign, Soft, non-tender. Renal:  No costovertebral tenderness  GU:  No performed at this time. Endoc: No evident thyromegaly, no signs of acromegaly. Skin:   warm, no  rashes, no ecchymosis  Extremities: normal, no cyanosis, clubbing.  Other findings:    LABORATORY PANEL:   CBC No results for input(s): WBC, HGB, HCT, PLT in the last 168 hours. ------------------------------------------------------------------------------------------------------------------  Chemistries  No results for input(s): NA, K, CL, CO2, GLUCOSE, BUN, CREATININE, CALCIUM, MG, AST, ALT, ALKPHOS, BILITOT in the last 168 hours.  Invalid input(s): GFRCGP ------------------------------------------------------------------------------------------------------------------  Cardiac Enzymes No results for input(s): TROPONINI in the last 168 hours. ------------------------------------------------------------  RADIOLOGY:  No results found.     Thank  you for the consultation and for allowing [facility] to assist in the care of your patient. Our recommendations are noted above.  Please contact us if we can be of further service.   Marda Stalker, MD.  Board Certified in Internal Medicine, Pulmonary Medicine, [place], and Sleep Medicine.  [facility] Office Number: [phone]  Patricia Pesa, M.D.  Merton Border, M.D  12/03/2017

## 2017-12-03 ENCOUNTER — Ambulatory Visit: Payer: Medicare Other | Admitting: Internal Medicine

## 2017-12-03 ENCOUNTER — Encounter: Payer: Self-pay | Admitting: Internal Medicine

## 2017-12-03 VITALS — Ht 72.0 in

## 2017-12-03 DIAGNOSIS — R918 Other nonspecific abnormal finding of lung field: Secondary | ICD-10-CM

## 2017-12-03 NOTE — Patient Instructions (Addendum)
Will schedule you for a bronchoscopy biopsy. Follow up 1 week later.

## 2017-12-04 ENCOUNTER — Telehealth: Payer: Self-pay | Admitting: *Deleted

## 2017-12-04 NOTE — Telephone Encounter (Signed)
I will give to [facility] to Pre-cert 09/05/62

## 2017-12-04 NOTE — Telephone Encounter (Signed)
Patient scheduled for bronchoscopy (EBUS) on June 10,2019. Procedure code [phone]....[id number] Provider:Dr. Ashby Dawes   Patient aware of pre-assessment appt 12/06/17 @ 1:45.

## 2017-12-05 ENCOUNTER — Other Ambulatory Visit: Payer: Medicare Other

## 2017-12-06 ENCOUNTER — Encounter
Admission: RE | Admit: 2017-12-06 | Discharge: 2017-12-06 | Disposition: A | Discharge status: Home / Self Care | Payer: Medicare Other | Source: Ambulatory Visit | Attending: Internal Medicine | Admitting: Internal Medicine

## 2017-12-06 ENCOUNTER — Other Ambulatory Visit: Payer: Self-pay

## 2017-12-06 DIAGNOSIS — Z7982 Long term (current) use of aspirin: Secondary | ICD-10-CM | POA: Diagnosis not present

## 2017-12-06 DIAGNOSIS — K219 Gastro-esophageal reflux disease without esophagitis: Secondary | ICD-10-CM | POA: Diagnosis not present

## 2017-12-06 DIAGNOSIS — Z9889 Other specified postprocedural states: Secondary | ICD-10-CM | POA: Diagnosis not present

## 2017-12-06 DIAGNOSIS — Z801 Family history of malignant neoplasm of trachea, bronchus and lung: Secondary | ICD-10-CM | POA: Diagnosis not present

## 2017-12-06 DIAGNOSIS — J439 Emphysema, unspecified: Secondary | ICD-10-CM | POA: Diagnosis not present

## 2017-12-06 DIAGNOSIS — Z8249 Family history of ischemic heart disease and other diseases of the circulatory system: Secondary | ICD-10-CM | POA: Diagnosis not present

## 2017-12-06 DIAGNOSIS — Z800 Family history of malignant neoplasm of digestive organs: Secondary | ICD-10-CM | POA: Diagnosis not present

## 2017-12-06 DIAGNOSIS — Z880 Allergy status to penicillin: Secondary | ICD-10-CM | POA: Diagnosis not present

## 2017-12-06 DIAGNOSIS — I10 Essential (primary) hypertension: Secondary | ICD-10-CM

## 2017-12-06 DIAGNOSIS — G709 Myoneural disorder, unspecified: Secondary | ICD-10-CM | POA: Diagnosis not present

## 2017-12-06 DIAGNOSIS — C771 Secondary and unspecified malignant neoplasm of intrathoracic lymph nodes: Secondary | ICD-10-CM | POA: Diagnosis not present

## 2017-12-06 DIAGNOSIS — R918 Other nonspecific abnormal finding of lung field: Secondary | ICD-10-CM | POA: Diagnosis present

## 2017-12-06 DIAGNOSIS — Z87891 Personal history of nicotine dependence: Secondary | ICD-10-CM | POA: Diagnosis not present

## 2017-12-06 DIAGNOSIS — Z79899 Other long term (current) drug therapy: Secondary | ICD-10-CM | POA: Diagnosis not present

## 2017-12-06 DIAGNOSIS — R590 Localized enlarged lymph nodes: Secondary | ICD-10-CM | POA: Diagnosis present

## 2017-12-06 HISTORY — DX: Dyspnea, unspecified: R06.00

## 2017-12-06 HISTORY — DX: Unspecified asthma, uncomplicated: J45.909

## 2017-12-06 LAB — COMPREHENSIVE METABOLIC PANEL
ALT: 15 U/L — AB (ref 17–63)
AST: 18 U/L (ref 15–41)
Albumin: 4.2 g/dL (ref 3.5–5.0)
Alkaline Phosphatase: 51 U/L (ref 38–126)
Anion gap: 12 (ref 5–15)
BUN: 14 mg/dL (ref 6–20)
CHLORIDE: 101 mmol/L (ref 101–111)
CO2: 25 mmol/L (ref 22–32)
CREATININE: 0.74 mg/dL (ref 0.61–1.24)
Calcium: 9.1 mg/dL (ref 8.9–10.3)
Glucose, Bld: 101 mg/dL — ABNORMAL HIGH (ref 65–99)
POTASSIUM: 3.1 mmol/L — AB (ref 3.5–5.1)
Sodium: 138 mmol/L (ref 135–145)
Total Bilirubin: 0.8 mg/dL (ref 0.3–1.2)
Total Protein: 7.5 g/dL (ref 6.5–8.1)

## 2017-12-06 NOTE — Patient Instructions (Signed)
Your procedure is scheduled on: Monday December 09, 2017 Report to Day Surgery on the 2nd floor of the [facility] AT 11:30 AM   REMEMBER: Instructions that are not followed completely may result in serious medical risk, up to and including death; or upon the discretion of your surgeon and anesthesiologist your surgery may need to be rescheduled.  Do not eat food after midnight the night before your procedure.  No gum chewing, lozengers or hard candies.  You may however, drink CLEAR liquids up to 2 hours before you are scheduled to arrive for your surgery. Do not drink anything within 2 hours of the start of your surgery.  Clear liquids include: - water     Do NOT drink anything that is not on this list. .  No Alcohol for 24 hours before or after surgery.  No Smoking including e-cigarettes for 24 hours prior to surgery.  No chewable tobacco products for at least 6 hours prior to surgery.  No nicotine patches on the day of surgery.  On the morning of surgery brush your teeth with toothpaste and water, you may rinse your mouth with mouthwash if you wish. Do not swallow any toothpaste or mouthwash.  Notify your doctor if there is any change in your medical condition (cold, fever, infection).  Do not wear jewelry, make-up, hairpins, clips or nail polish.  Do not wear lotions, powders, or perfumes. You may wear deodorant.  Do not shave 48 hours prior to surgery. Men may shave face and neck.  Contacts and dentures may not be worn into surgery.  Do not bring valuables to the hospital, including drivers license, insurance or credit cards.  [facility] is not responsible for any belongings or valuables.   TAKE THESE MEDICATIONS THE MORNING OF SURGERY:  AMLODIPINE CARVEDILOL TAMSULOSIN    Use inhalers on the day of surgery   Follow recommendations from Cardiologist, Pulmonologist or PCP regarding stopping Aspirin UNTIL AFTER SURGERY.  Stop Anti-inflammatories (NSAIDS) such as  Advil, Aleve, Ibuprofen, Motrin, Naproxen, Naprosyn and Aspirin based products such as Excedrin, Goodys Powder, BC Powder. (May take Tylenol or Acetaminophen if needed.)  Wear comfortable clothing (specific to your surgery type) to the hospital.  Plan for stool softeners for home use.     If you are being discharged the day of surgery, you will not be allowed to drive home. You will need a responsible adult to drive you home and stay with you that night.   If you are taking public transportation, you will need to have a responsible adult with you. Please confirm with your physician that it is acceptable to use public transportation.   Please call [phone] if you have any questions about these instructions.

## 2017-12-09 ENCOUNTER — Ambulatory Visit: Payer: Medicare Other

## 2017-12-09 ENCOUNTER — Ambulatory Visit: Payer: Medicare Other | Admitting: Anesthesiology

## 2017-12-09 ENCOUNTER — Ambulatory Visit
Admission: RE | Admit: 2017-12-09 | Discharge: 2017-12-09 | Disposition: A | Discharge status: Home / Self Care | Payer: Medicare Other | Source: Ambulatory Visit | Attending: Internal Medicine | Admitting: Internal Medicine

## 2017-12-09 ENCOUNTER — Other Ambulatory Visit: Payer: Self-pay

## 2017-12-09 ENCOUNTER — Encounter
Admission: RE | Disposition: A | Discharge status: Home / Self Care | Payer: Self-pay | Source: Ambulatory Visit | Attending: Internal Medicine

## 2017-12-09 ENCOUNTER — Encounter: Payer: Self-pay | Admitting: *Deleted

## 2017-12-09 DIAGNOSIS — Z800 Family history of malignant neoplasm of digestive organs: Secondary | ICD-10-CM | POA: Insufficient documentation

## 2017-12-09 DIAGNOSIS — J439 Emphysema, unspecified: Secondary | ICD-10-CM | POA: Insufficient documentation

## 2017-12-09 DIAGNOSIS — R918 Other nonspecific abnormal finding of lung field: Secondary | ICD-10-CM | POA: Diagnosis not present

## 2017-12-09 DIAGNOSIS — K219 Gastro-esophageal reflux disease without esophagitis: Secondary | ICD-10-CM | POA: Insufficient documentation

## 2017-12-09 DIAGNOSIS — I10 Essential (primary) hypertension: Secondary | ICD-10-CM | POA: Insufficient documentation

## 2017-12-09 DIAGNOSIS — Z8249 Family history of ischemic heart disease and other diseases of the circulatory system: Secondary | ICD-10-CM | POA: Insufficient documentation

## 2017-12-09 DIAGNOSIS — Z801 Family history of malignant neoplasm of trachea, bronchus and lung: Secondary | ICD-10-CM | POA: Insufficient documentation

## 2017-12-09 DIAGNOSIS — Z7982 Long term (current) use of aspirin: Secondary | ICD-10-CM | POA: Insufficient documentation

## 2017-12-09 DIAGNOSIS — Z880 Allergy status to penicillin: Secondary | ICD-10-CM | POA: Insufficient documentation

## 2017-12-09 DIAGNOSIS — G709 Myoneural disorder, unspecified: Secondary | ICD-10-CM | POA: Insufficient documentation

## 2017-12-09 DIAGNOSIS — C771 Secondary and unspecified malignant neoplasm of intrathoracic lymph nodes: Secondary | ICD-10-CM | POA: Insufficient documentation

## 2017-12-09 DIAGNOSIS — Z79899 Other long term (current) drug therapy: Secondary | ICD-10-CM | POA: Insufficient documentation

## 2017-12-09 DIAGNOSIS — Z9889 Other specified postprocedural states: Secondary | ICD-10-CM | POA: Insufficient documentation

## 2017-12-09 DIAGNOSIS — Z87891 Personal history of nicotine dependence: Secondary | ICD-10-CM | POA: Insufficient documentation

## 2017-12-09 HISTORY — PX: ENDOBRONCHIAL ULTRASOUND: SHX5096

## 2017-12-09 SURGERY — ENDOBRONCHIAL ULTRASOUND (EBUS)
Anesthesia: General

## 2017-12-09 MED ORDER — FUROSEMIDE 10 MG/ML IJ SOLN
20.0000 mg | Freq: Once | INTRAMUSCULAR | Status: AC
  Administered 2017-12-09: 20 mg via INTRAVENOUS

## 2017-12-09 MED ORDER — FAMOTIDINE 20 MG PO TABS
20.0000 mg | ORAL_TABLET | Freq: Once | ORAL | Status: AC
  Administered 2017-12-09: 20 mg via ORAL

## 2017-12-09 MED ORDER — FENTANYL CITRATE (PF) 100 MCG/2ML IJ SOLN
INTRAMUSCULAR | Status: AC
  Filled 2017-12-09: qty 2

## 2017-12-09 MED ORDER — SUGAMMADEX SODIUM 200 MG/2ML IV SOLN
INTRAVENOUS | Status: AC
  Filled 2017-12-09: qty 2

## 2017-12-09 MED ORDER — LACTATED RINGERS IV SOLN
INTRAVENOUS | Status: DC
  Administered 2017-12-09: 12:00:00 via INTRAVENOUS

## 2017-12-09 MED ORDER — FUROSEMIDE 10 MG/ML IJ SOLN
10.0000 mg | Freq: Two times a day (BID) | INTRAMUSCULAR | Status: DC | PRN
  Administered 2017-12-09 (×2): 10 mg via INTRAVENOUS

## 2017-12-09 MED ORDER — FENTANYL CITRATE (PF) 100 MCG/2ML IJ SOLN
INTRAMUSCULAR | Status: DC | PRN
  Administered 2017-12-09 (×2): 50 ug via INTRAVENOUS

## 2017-12-09 MED ORDER — SUCCINYLCHOLINE CHLORIDE 20 MG/ML IJ SOLN
INTRAMUSCULAR | Status: DC | PRN
  Administered 2017-12-09: 100 mg via INTRAVENOUS

## 2017-12-09 MED ORDER — SUGAMMADEX SODIUM 200 MG/2ML IV SOLN
INTRAVENOUS | Status: DC | PRN
  Administered 2017-12-09: 200 mg via INTRAVENOUS

## 2017-12-09 MED ORDER — ONDANSETRON HCL 4 MG/2ML IJ SOLN
INTRAMUSCULAR | Status: DC | PRN
  Administered 2017-12-09: 4 mg via INTRAVENOUS

## 2017-12-09 MED ORDER — DEXAMETHASONE SODIUM PHOSPHATE 10 MG/ML IJ SOLN
INTRAMUSCULAR | Status: DC | PRN
  Administered 2017-12-09: 10 mg via INTRAVENOUS

## 2017-12-09 MED ORDER — FAMOTIDINE 20 MG PO TABS
ORAL_TABLET | ORAL | Status: AC
  Administered 2017-12-09: 20 mg via ORAL
  Filled 2017-12-09: qty 1

## 2017-12-09 MED ORDER — ROCURONIUM BROMIDE 100 MG/10ML IV SOLN
INTRAVENOUS | Status: DC | PRN
  Administered 2017-12-09 (×2): 10 mg via INTRAVENOUS
  Administered 2017-12-09: 40 mg via INTRAVENOUS

## 2017-12-09 MED ORDER — FUROSEMIDE 10 MG/ML IJ SOLN
INTRAMUSCULAR | Status: AC
  Filled 2017-12-09: qty 2

## 2017-12-09 MED ORDER — IPRATROPIUM-ALBUTEROL 0.5-2.5 (3) MG/3ML IN SOLN
RESPIRATORY_TRACT | Status: AC
  Administered 2017-12-09: 3 mL via RESPIRATORY_TRACT
  Filled 2017-12-09: qty 3

## 2017-12-09 MED ORDER — PHENYLEPHRINE HCL 10 MG/ML IJ SOLN
INTRAMUSCULAR | Status: DC | PRN
  Administered 2017-12-09 (×3): 100 ug via INTRAVENOUS

## 2017-12-09 MED ORDER — PROPOFOL 10 MG/ML IV BOLUS
INTRAVENOUS | Status: AC
  Filled 2017-12-09: qty 20

## 2017-12-09 MED ORDER — IPRATROPIUM-ALBUTEROL 0.5-2.5 (3) MG/3ML IN SOLN
3.0000 mL | RESPIRATORY_TRACT | Status: DC
  Administered 2017-12-09: 3 mL via RESPIRATORY_TRACT

## 2017-12-09 MED ORDER — LIDOCAINE HCL 2 % EX GEL
1.0000 "application " | Freq: Once | CUTANEOUS | Status: DC
  Filled 2017-12-09: qty 5

## 2017-12-09 MED ORDER — LIDOCAINE HCL (CARDIAC) PF 100 MG/5ML IV SOSY
PREFILLED_SYRINGE | INTRAVENOUS | Status: DC | PRN
  Administered 2017-12-09: 100 mg via INTRAVENOUS

## 2017-12-09 MED ORDER — FUROSEMIDE 10 MG/ML IJ SOLN
INTRAMUSCULAR | Status: AC
  Administered 2017-12-09: 10 mg via INTRAVENOUS
  Filled 2017-12-09: qty 2

## 2017-12-09 MED ORDER — MIDAZOLAM HCL 2 MG/2ML IJ SOLN
INTRAMUSCULAR | Status: DC | PRN
  Administered 2017-12-09: 2 mg via INTRAVENOUS

## 2017-12-09 MED ORDER — PHENYLEPHRINE HCL 0.25 % NA SOLN
1.0000 | Freq: Four times a day (QID) | NASAL | Status: DC | PRN
  Filled 2017-12-09: qty 15

## 2017-12-09 MED ORDER — MIDAZOLAM HCL 2 MG/2ML IJ SOLN
INTRAMUSCULAR | Status: AC
  Filled 2017-12-09: qty 2

## 2017-12-09 MED ORDER — BUTAMBEN-TETRACAINE-BENZOCAINE 2-2-14 % EX AERO
1.0000 | INHALATION_SPRAY | Freq: Once | CUTANEOUS | Status: DC
  Filled 2017-12-09: qty 20

## 2017-12-09 MED ORDER — FENTANYL CITRATE (PF) 100 MCG/2ML IJ SOLN: 25.0000 ug | INTRAMUSCULAR | Status: DC | PRN

## 2017-12-09 MED ORDER — PROPOFOL 10 MG/ML IV BOLUS
INTRAVENOUS | Status: DC | PRN
  Administered 2017-12-09: 150 mg via INTRAVENOUS
  Administered 2017-12-09: 50 mg via INTRAVENOUS

## 2017-12-09 MED ORDER — ONDANSETRON HCL 4 MG/2ML IJ SOLN: 4.0000 mg | Freq: Once | INTRAMUSCULAR | Status: DC | PRN

## 2017-12-09 NOTE — Transfer of Care (Signed)
Immediate Anesthesia Transfer of Care Note  Patient: Mark Mccarty  Procedure(s) Performed: ENDOBRONCHIAL ULTRASOUND (N/A )  Patient Location: PACU  Anesthesia Type:General  Level of Consciousness: awake  Airway & Oxygen Therapy: Patient connected to face mask oxygen  Post-op Assessment: Post -op Vital signs reviewed and stable  Post vital signs: stable  Last Vitals:  Vitals Value Taken Time  BP 140/76 12/09/2017  2:10 PM  Temp 36.6 C 12/09/2017  2:09 PM  Pulse 66 12/09/2017  2:10 PM  Resp 14 12/09/2017  2:10 PM  SpO2 94 % 12/09/2017  2:10 PM  Vitals shown include unvalidated device data.  Last Pain:  Vitals:   12/09/17 1409  TempSrc: Temporal  PainSc: 0-No pain      Patients Stated Pain Goal: 0 (78/47/84 1282)  Complications: No apparent anesthesia complications

## 2017-12-09 NOTE — Anesthesia Procedure Notes (Signed)
Procedure Name: Intubation Date/Time: 12/09/2017 1:15 PM Performed by: Aline Brochure, CRNA Pre-anesthesia Checklist: Patient identified, Emergency Drugs available, Suction available and Patient being monitored Patient Re-evaluated:Patient Re-evaluated prior to induction Oxygen Delivery Method: Circle system utilized Preoxygenation: Pre-oxygenation with 100% oxygen Induction Type: IV induction Ventilation: Mask ventilation without difficulty Laryngoscope Size: Mac and 4 Grade View: Grade I Tube size: 8.5 mm Airway Equipment and Method: Stylet Placement Confirmation: ETT inserted through vocal cords under direct vision,  positive ETCO2 and breath sounds checked- equal and bilateral Secured at: 23 cm Tube secured with: Tape Dental Injury: Teeth and Oropharynx as per pre-operative assessment

## 2017-12-09 NOTE — Anesthesia Preprocedure Evaluation (Addendum)
Anesthesia Evaluation  Patient identified by MRN, date of birth, ID band Patient awake    Reviewed: Allergy & Precautions, NPO status , Patient's Chart, lab work & pertinent test results, reviewed documented beta blocker date and time   Airway Mallampati: III  TM Distance: >3 FB     Dental  (+) Chipped   Pulmonary shortness of breath, asthma , former smoker,           Cardiovascular hypertension, Pt. on medications and Pt. on home beta blockers + dysrhythmias      Neuro/Psych  Neuromuscular disease    GI/Hepatic GERD  Controlled,  Endo/Other    Renal/GU      Musculoskeletal   Abdominal   Peds  Hematology   Anesthesia Other Findings Lung mass. No cardiac stent placement. EKG ok.  Reproductive/Obstetrics                            Anesthesia Physical Anesthesia Plan  ASA: III  Anesthesia Plan: General   Post-op Pain Management:    Induction: Intravenous  PONV Risk Score and Plan:   Airway Management Planned: Oral ETT  Additional Equipment:   Intra-op Plan:   Post-operative Plan:   Informed Consent: I have reviewed the patients History and Physical, chart, labs and discussed the procedure including the risks, benefits and alternatives for the proposed anesthesia with the patient or authorized representative who has indicated his/her understanding and acceptance.     Plan Discussed with: CRNA  Anesthesia Plan Comments:         Anesthesia Quick Evaluation

## 2017-12-09 NOTE — Op Note (Signed)
  [facility]            Bronchoscopy Note   FINDINGS/SUMMARY:   - EBUS guided lymph node sampling of the right paratracheal and right hilar lymph node stations.  Preliminary on-site cytology suggestive of malignancy. - Transbronchial cytology brushing of the right upper lobe, with bronchial lavage performed of the right upper lobe. - Copious bilateral mucosal secretions which were suctioned.  No airway abnormalities, no endobronchial lesions noted.  There was moderate mucosal erythema and edema.  Indication: Right lung mass and mediastinal lymphadenopathy The patient (or their representative) was informed of the risks (including but not limited to bleeding, infection, respiratory failure, lung injury, tooth/oral injury) and benefits of the procedure and gave consent, see chart.   Pre-op diagnosis: Right lung mass and mediastinal lymphadenopathy Post-op diagnosis: Same Estimated blood loss: 15 cc  Medications for procedure: Please see anesthesia notes.  Procedure description: After obtaining informed consent the time was called to confirm the patient the procedure.  Patient was intubated by anesthesia please see their notes for further details.  The EBUS scope was passed by the endotracheal tube to the carina.  There was a large lymph node seen at the right paratracheal lymph node station, this was sampled with multiple passes.  Preliminary results suggestive of malignancy, by on-site cytology.  The bronchoscope was then taken to the right hilar lymph node station where lymph node sampling was again performed.  The EBUS scope was then removed. The white light bronchoscope was passed by the endotracheal tube, an anatomical tumor was performed, there was moderate mucosal secretions throughout both lungs, which were suctioned.  There was moderate erythema and edema throughout both lungs suggestive of chronic bronchitis.  No anatomical abnormalities were found, no endobronchial  lesions were found.  The bronchoscope was wedged into the right upper lobe, cytology brush was directed into the right upper lobe x2.  Bronchoalveolar lavage was then performed with good returns, results were sent for cytology and microbiology.  Having obtained adequate specimens at this time, the procedure was terminated.    Condition post procedure: Stable   Complications: None noted     Deep Ashby Dawes, MD.  Board Certified in Internal Medicine, Pulmonary Medicine, [place], and Sleep Medicine.  [facility] Office Number: [phone]  Patricia Pesa, M.D.  Vilinda Boehringer, M.D.  Cheral Marker, M.D  12/09/2017

## 2017-12-09 NOTE — Anesthesia Post-op Follow-up Note (Signed)
Anesthesia QCDR form completed.        

## 2017-12-09 NOTE — Anesthesia Postprocedure Evaluation (Signed)
Anesthesia Post Note  Patient: Mark Mccarty  Procedure(s) Performed: ENDOBRONCHIAL ULTRASOUND (N/A )  Patient location during evaluation: PACU Anesthesia Type: General Level of consciousness: awake and alert Pain management: pain level controlled Vital Signs Assessment: post-procedure vital signs reviewed and stable Respiratory status: spontaneous breathing, nonlabored ventilation, respiratory function stable and patient connected to nasal cannula oxygen Cardiovascular status: blood pressure returned to baseline and stable Postop Assessment: no apparent nausea or vomiting Anesthetic complications: no     Last Vitals:  Vitals:   12/09/17 1600 12/09/17 1624  BP: (!) 146/75 (!) 147/82  Pulse: 76 77  Resp: 18   Temp: (!) 35.8 C   SpO2: 98% 99%    Last Pain:  Vitals:   12/09/17 1624  TempSrc:   PainSc: 0-No pain                 Maddoxx Burkitt S

## 2017-12-09 NOTE — Interval H&P Note (Signed)
History and Physical Interval Note:  12/09/2017 12:04 PM  Mark Mccarty  has presented today for surgery, with the diagnosis of right lung mass  The various methods of treatment have been discussed with the patient and family. After consideration of risks, benefits and other options for treatment, the patient has consented to  Procedure(s): ENDOBRONCHIAL ULTRASOUND (N/A) as a surgical intervention .  The patient's history has been reviewed, patient examined, no change in status, stable for surgery.  I have reviewed the patient's chart and labs.  Questions were answered to the patient's satisfaction.     [name]

## 2017-12-09 NOTE — Progress Notes (Signed)
Pt breathing without difficulty   No coughing  voided400cc clear urine

## 2017-12-10 ENCOUNTER — Ambulatory Visit
Admission: RE | Admit: 2017-12-10 | Discharge: 2017-12-10 | Disposition: A | Discharge status: Home / Self Care | Payer: Medicare Other | Source: Ambulatory Visit | Attending: Oncology | Admitting: Oncology

## 2017-12-10 ENCOUNTER — Encounter: Payer: Self-pay | Admitting: Internal Medicine

## 2017-12-10 DIAGNOSIS — R918 Other nonspecific abnormal finding of lung field: Secondary | ICD-10-CM | POA: Diagnosis not present

## 2017-12-10 MED ORDER — GADOBENATE DIMEGLUMINE 529 MG/ML IV SOLN
20.0000 mL | Freq: Once | INTRAVENOUS | Status: AC | PRN
  Administered 2017-12-10: 19 mL via INTRAVENOUS

## 2017-12-11 LAB — ACID FAST SMEAR (AFB, MYCOBACTERIA): Acid Fast Smear: NEGATIVE

## 2017-12-11 LAB — ACID FAST SMEAR (AFB): SOURCE (AFB): NORMAL

## 2017-12-12 LAB — CYTOLOGY - NON PAP

## 2017-12-12 LAB — CULTURE, BAL-QUANTITATIVE
GRAM STAIN: NONE SEEN
SPECIAL REQUESTS: NORMAL

## 2017-12-12 LAB — CULTURE, BAL-QUANTITATIVE W GRAM STAIN: Culture: NO GROWTH

## 2017-12-13 ENCOUNTER — Inpatient Hospital Stay: Payer: Medicare Other | Attending: Oncology | Admitting: Oncology

## 2017-12-13 ENCOUNTER — Encounter: Payer: Self-pay | Admitting: Oncology

## 2017-12-13 ENCOUNTER — Other Ambulatory Visit: Payer: Self-pay

## 2017-12-13 VITALS — BP 149/84 | HR 76 | Temp 97.7°F | Resp 18 | Wt 206.0 lb

## 2017-12-13 DIAGNOSIS — Z87891 Personal history of nicotine dependence: Secondary | ICD-10-CM | POA: Diagnosis not present

## 2017-12-13 DIAGNOSIS — C3491 Malignant neoplasm of unspecified part of right bronchus or lung: Secondary | ICD-10-CM | POA: Insufficient documentation

## 2017-12-13 DIAGNOSIS — C3411 Malignant neoplasm of upper lobe, right bronchus or lung: Secondary | ICD-10-CM | POA: Diagnosis not present

## 2017-12-13 DIAGNOSIS — C778 Secondary and unspecified malignant neoplasm of lymph nodes of multiple regions: Secondary | ICD-10-CM | POA: Insufficient documentation

## 2017-12-13 DIAGNOSIS — Z7189 Other specified counseling: Secondary | ICD-10-CM | POA: Insufficient documentation

## 2017-12-13 DIAGNOSIS — R0602 Shortness of breath: Secondary | ICD-10-CM | POA: Diagnosis not present

## 2017-12-13 MED ORDER — PROCHLORPERAZINE MALEATE 10 MG PO TABS: 10.0000 mg | ORAL_TABLET | Freq: Four times a day (QID) | ORAL | 1 refills | Status: DC | PRN

## 2017-12-13 MED ORDER — ONDANSETRON HCL 8 MG PO TABS: 8.0000 mg | ORAL_TABLET | Freq: Two times a day (BID) | ORAL | 1 refills | Status: DC | PRN

## 2017-12-13 MED ORDER — LIDOCAINE-PRILOCAINE 2.5-2.5 % EX CREA: TOPICAL_CREAM | CUTANEOUS | 3 refills | Status: DC

## 2017-12-13 NOTE — Progress Notes (Signed)
Patient here today for follow up.   

## 2017-12-13 NOTE — Progress Notes (Signed)
START ON PATHWAY REGIMEN - Non-Small Cell Lung     Administer weekly:     Paclitaxel      Carboplatin   **Always confirm dose/schedule in your pharmacy ordering system**  Patient Characteristics: Stage III - Unresectable, PS = 0, 1 AJCC T Category: T3 Current Disease Status: No Distant Mets or Local Recurrence AJCC N Category: N1 AJCC M Category: M0 AJCC 8 Stage Grouping: IIIA Performance Status: PS = 0, 1 Intent of Therapy: Curative Intent, Discussed with Patient

## 2017-12-13 NOTE — Progress Notes (Addendum)
Hematology/Oncology Consult note [facility] [phone] [phone]   Patient Care Team: [name], MD as PCP - General (Internal Medicine) [name], RN as Registered Nurse  REFERRING PROVIDER: Dr.Fleming CHIEF COMPLAINTS/PURPOSE OF CONSULTATION:  Evaluation of lung mass.   HISTORY OF PRESENTING ILLNESS:  Mark Mccarty is a  67 y.o.  male with PMH listed below who was referred to me for evaluation of lung mass Patient reports feeling shortness of breath on exertion and he went to Dr. Raul Del pulmonology for evaluation.  He is an ex-smoker, quit 2 years ago.  Smoked about 43 years 1 pack to 1.5 pack a day. 10/30/2017 CT chest without contrast showed irregular right upper lobe nodule 2.6 x 2.1 cm touches pleural surface. There is also right hilar in the right paratracheal ipsilateral nodal metastasis. 11/12/2017 PET scan showed hyper metabolic right upper lobe nodule abutting the pleural surface most consistent with primary bronchogenic carcinoma.  Ipsilateral right hilar and mediastinal metastatic adenopathy. There are 2 irregular nodules in the left upper lobe which have very very mild metabolic activity.  Too small to characterize remain indeterminate. 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 male who has above history reviewed by me today presents for follow up visit for management of newly diagnosed stage IIIa lung adenocarcinoma. During the interval he has been referred to pulmonology underwent biopsy of right hilar lymph nodes bronchoscopy.  Pathology is positive for metastatic adenocarcinoma.  Patient presents to discuss about pathology diagnosis and future treatment plan.   He reports feeling anxious about the diagnosis.  Otherwise no complaints.  Chronic shortness of breath remains the same.   Review of Systems  Constitutional: Negative for chills, fever, malaise/fatigue and weight  loss.  [name]: Negative for congestion, ear discharge, ear pain, nosebleeds, sinus pain and sore throat.   Eyes: Negative for double vision, photophobia, pain, discharge and redness.  Respiratory: Positive for shortness of breath. Negative for cough, hemoptysis, sputum production and wheezing.   Cardiovascular: Negative for chest pain, palpitations, orthopnea, claudication and leg swelling.  Gastrointestinal: Negative for abdominal pain, blood in stool, constipation, diarrhea, heartburn, melena, nausea and vomiting.  Genitourinary: Negative for dysuria, flank pain, frequency and hematuria.  Musculoskeletal: Negative for back pain, myalgias and neck pain.  Skin: Negative for itching and rash.  Neurological: Negative for dizziness, tingling, tremors, focal weakness, weakness and headaches.  Endo/Heme/Allergies: Negative for environmental allergies. Does not bruise/bleed easily.  Psychiatric/Behavioral: Negative for depression and hallucinations. The patient is not nervous/anxious.     MEDICAL HISTORY:  Past Medical History:  Diagnosis Date  . Asthma   . Cataract    left cataract surgery  . Dyspnea   . [name]   . GERD (gastroesophageal reflux disease)   . Hypertension   . Neuromuscular disorder ([place])    pt has knees bilateral with tendon issues that cause pain    SURGICAL HISTORY: Past Surgical History:  Procedure Laterality Date  . CARDIAC CATHETERIZATION N/A 12/16/2014   Procedure: Left Heart Cath;  Surgeon: [name], MD;  Location: [facility];  Service: Cardiovascular;  Laterality: N/A;  . CYST REMOVAL TRUNK     chest and back over time and it was removed  . ENDOBRONCHIAL ULTRASOUND N/A 12/09/2017   Procedure: ENDOBRONCHIAL ULTRASOUND;  Surgeon: [name], MD;  Location: [name];  Service: Pulmonary;  Laterality: N/A;  . left cataract surgery      SOCIAL HISTORY:  Social History   Socioeconomic History  . Marital status: Divorced     Spouse name: Not on file  . Number of children: Not on file  . Years of education: Not on file  . Highest education level: Not on file  Occupational History  . Not on file  Social Needs  . Financial resource strain: Not on file  . Food insecurity:    Worry: Not on file    Inability: Not on file  . Transportation needs:    Medical: Not on file    Non-medical: Not on file  Tobacco Use  . Smoking status: Former Smoker    Packs/day: 1.50    Years: 35.00    Pack years: 52.50    Types: Cigarettes    Last attempt to quit: 06/22/2015    Years since quitting: 2.4  . Smokeless tobacco: Never Used  Substance and Sexual Activity  . Alcohol use: Yes    Comment: a fifth in a week  . Drug use: No  . Sexual activity: Not Currently  Lifestyle  . Physical activity:    Days per week: Not on file    Minutes per session: Not on file  . Stress: Not on file  Relationships  . Social connections:    Talks on phone: Not on file    Gets together: Not on file    Attends religious service: Not on file    Active member of club or organization: Not on file    Attends meetings of clubs or organizations: Not on file    Relationship status: Not on file  . Intimate partner violence:    Fear of current or ex partner: Not on file    Emotionally abused: Not on file    Physically abused: Not on file    Forced sexual activity: Not on file  Other Topics Concern  . Not on file  Social History Narrative  . Not on file    FAMILY HISTORY: Family History  Problem Relation Age of Onset  . Hypertension Mother   . Breast cancer Mother   . Heart attack Mother   . Lung cancer Father   . Heart disease Sister   . Diabetes Sister   . Colon cancer Sister     ALLERGIES:  is allergic to penicillins.  MEDICATIONS:  Current Outpatient Medications  Medication Sig Dispense Refill  . acetaminophen (TYLENOL) 500 MG tablet Take 500 mg by mouth as needed.    [name] albuterol (PROVENTIL HFA;VENTOLIN HFA) 108 (90  Base) MCG/ACT inhaler Inhale 2 puffs into the lungs every 6 (six) hours as needed.  12  . amLODipine (NORVASC) 10 MG tablet Take 10 mg by mouth daily.    Jearl Klinefelter ELLIPTA 62.5-25 MCG/INH AEPB Inhale 1 puff into the lungs daily.  6  . aspirin 81 MG tablet Take 81 mg by mouth daily.    . carvedilol (COREG) 25 MG tablet Take 25 mg by mouth 2 (two) times daily with a meal.    . gabapentin (NEURONTIN) 300 MG capsule Take 300 mg by mouth at bedtime.    [name] ibuprofen (ADVIL,MOTRIN) 600 MG tablet Take 600 mg by mouth every 6 (six) hours as needed.    [name] lisinopril (PRINIVIL,ZESTRIL) 40 MG tablet Take 40 mg by mouth daily.    [name] omeprazole (PRILOSEC) 20 MG capsule Take 20 mg by mouth 2 (two) times daily before a meal.    . potassium chloride (K-DUR,KLOR-CON) 10 MEQ tablet Take 10 mEq by mouth  daily.    . sildenafil (VIAGRA) 100 MG tablet Take 100 mg by mouth as needed for erectile dysfunction.    . tamsulosin (FLOMAX) 0.4 MG CAPS capsule Take 0.4 mg by mouth daily.     No current facility-administered medications for this visit.      PHYSICAL EXAMINATION: ECOG PERFORMANCE STATUS: 0 - Asymptomatic Vitals:   12/13/17 1418  BP: (!) 149/84  Pulse: 76  Resp: 18  Temp: 97.7 F (36.5 C)  SpO2: 93%   Filed Weights   12/13/17 1418  Weight: 206 lb (93.4 kg)    Physical Exam  Constitutional: He is oriented to person, place, and time. He appears well-developed and well-nourished. No distress.  [name]:  Head: Normocephalic and atraumatic.  Right Ear: External ear normal.  Left Ear: External ear normal.  Eyes: Pupils are equal, round, and reactive to light. EOM are normal. No scleral icterus.  Neck: Normal range of motion. Neck supple.  Cardiovascular: Normal rate, regular rhythm and normal heart sounds.  Pulmonary/Chest: Effort normal and breath sounds normal. No respiratory distress. He has no wheezes. He has no rales. He exhibits no tenderness.  Decreased breath sounds bilaterally  Abdominal: Soft.  Bowel sounds are normal. He exhibits no distension and no mass. There is no tenderness.  Musculoskeletal: Normal range of motion. He exhibits no edema or deformity.  Lymphadenopathy:    He has no cervical adenopathy.  Neurological: He is alert and oriented to person, place, and time. No cranial nerve deficit. Coordination normal.  Skin: Skin is warm and dry. No rash noted.  Psychiatric: He has a normal mood and affect. His behavior is normal. Thought content normal.     LABORATORY DATA:  I have reviewed the data as listed Lab Results  Component Value Date   WBC 9.9 07/10/2014   HGB 15.8 07/10/2014   HCT 47.2 07/10/2014   MCV 93 07/10/2014   PLT 236 07/10/2014   Recent Labs    12/06/17 1345  NA 138  K 3.1*  CL 101  CO2 25  GLUCOSE 101*  BUN 14  CREATININE 0.74  CALCIUM 9.1  GFRNONAA >60  GFRAA >60  PROT 7.5  ALBUMIN 4.2  AST 18  ALT 15*  ALKPHOS 51  BILITOT 0.8       ASSESSMENT & PLAN:  1. Primary lung adenocarcinoma, right [facility])    #MRI brain was independently reviewed by me and discussed with patient no metastatic disease. Pathology results discussed with patient.  Discussed with him about the diagnosis of lung adenocarcinoma, disease extent, prognosis and treatment plan. He has T3 N1 M0 lung adenocarcinoma.  I recommend concurrent chemoradiation with [place] and Taxol, followed by maintenance immunotherapy with durvalumab.  I explained to the patient the risks and benefits of chemotherapy including all but not limited to hair loss, mouth sore, nausea, vomiting, low blood counts, bleeding, and risk of life threatening infection and even death, secondary malignancy etc.  Risk of neuropathy is associated with Taxol. Patient voices understanding and willing to proceed chemotherapy.   # Chemotherapy education; referred to vascular surgery for port placement.  Antiemetics-Zofran and Compazine; EMLA cream sent to pharmacy #Goals of care was discussed.  Treatment is with  curative intent. All questions were answered. The patient knows to call the clinic with any problems questions or concerns.  Return of visit: To be determined depending on the start date of radiation. Thank you for this kind referral and the opportunity to participate in the care of this  patient. A copy of today's note is routed to referring provider  Total face to face encounter time for this patient visit was 45 min. >50% of the time was  spent in counseling and coordination of care.     [name], MD, PhD Hematology Oncology [facility] Pager- [phone] 12/13/2017

## 2017-12-13 NOTE — Addendum Note (Signed)
Addended by: [name] on: 12/13/2017 10:02 PM   Modules accepted: Orders

## 2017-12-16 ENCOUNTER — Telehealth (INDEPENDENT_AMBULATORY_CARE_PROVIDER_SITE_OTHER): Payer: Self-pay

## 2017-12-16 NOTE — Telephone Encounter (Signed)
Left a message for a return call from the patient to schedule his port placement.

## 2017-12-16 NOTE — Progress Notes (Signed)
[place] [facility] Consultation      Assessment and Plan:  Adenocarcinoma of the lung - Status post EBUS bronchoscopy on 12/09/2017 biopsies were positive in the right paratracheal and right hilar lymph nodes. - Patient is being followed by oncology with plan for chemo and radiation.  COPD with severe apical/bullous emphysema. - Symptoms appear to be controlled with current regimen, encouraged to restart Anoro. (Informed pt that he has refills).  -Patient will continue to follow-up with Dr. Raul Del.  Infected sebacious cyst/abscess over right scapula.  --Patient is planning on going to have this drained today or tomorrow.  Encouraged to do so before starting chemotherapy.  Return as needed. .    Date: 12/16/2017  MRN# [id number] Mark Mccarty [birth year]  Mark Mccarty is a 66 y.o. old male seen in consultation for chief complaint of:    Chief Complaint  Patient presents with  . Follow-up    post bronch: patient has f/u with Dr. Raul Del but awaiting results of bronchoscopy..  . Shortness of Breath    unchanged.    HPI:   The patient is a 67 year old male, with COPD.  He was last seen due to abnormal imaging, he subsequently underwent EBUS bronchoscopy on 12/09/2017 which showed adenocarcinoma.   He feels that since the biopsy he has been having a bit of a cough and clearing his throat. He feels that his breathing is doing ok. He was on Anoro but ran out.   pulmonary function testing  showed COPD with an FEV1 of 64% predicted.  He was also sent for imaging which was abnormal.  He used to work as a Pharmacist, community and smoked, stopped about 2.5 yrs ago. He does yard at home, he does not do heavier work because he gets winded. He uses an Anoro inhaler once daily, not sure if it helps. He uses xopenex once per day also not sure if it helps.   **Imaging personally reviewed, chest x-ray 4/23 CT chest 10/30/2017, as well as PET scan 5/14 CT chest showed a 2 cm right upper  lobe apical segment nodule with SUV max of 9.2, severe apical bullous emphysema right paratracheal lymphadenopathy SUV of 5.6, as well as right hilar lymphadenopathy SUV of 9.6.  **Echocardiogram 10/11/2017; INTERPRETATION LOW-NORMAL LEFT VENTRICULAR SYSTOLIC FUNCTION WITH AN ESTIMATED EF = 45-50 % NORMAL RIGHT VENTRICULAR SYSTOLIC FUNCTION MILD-TO-MODERATE MITRAL AND AORTIC VALVE INSUFFICIENCY MILD TRICUSPID VALVE INSUFFICIENCY NO VALVULAR STENOSIS  **Pulmonary function testing 10/30/2017 Diagnostics: SPIROMETRY: FVC was 3.17 liters, 83% of predicted/Post 2.95, 77%, -7% Change FEV1 was 1.91, 64% of predicted/Post 1.79, 60%, -6% Change FEV1 ratio was 60/Post 61 FEF 25-75% liters per second was 20% of predicted/Post 20%, -3% Change SMALL VOLUME NEBULIZER given with 2.5 mg Albuterol for Post Spirometry.  LUNG VOLUMES: TLC was 77% of predicted RV was 87% of predicted  DIFFUSION CAPACITY: DLCO was 52% of predicted DLCO/VA was 98% of predicted  FLOW VOLUME LOOP: Expiratory flow volume is c/w obstruction ( delayed)  Impression Spirometry is c/w moderate obstruction/copd TLC is slightly decreased DLCO is moderate - severely decreased. DLCO/VA is in normal range   Medication:    Current Outpatient Medications:  .  acetaminophen (TYLENOL) 500 MG tablet, Take 500 mg by mouth as needed., Disp: , Rfl:  .  albuterol (PROVENTIL HFA;VENTOLIN HFA) 108 (90 Base) MCG/ACT inhaler, Inhale 2 puffs into the lungs every 6 (six) hours as needed., Disp: , Rfl: 12 .  amLODipine (NORVASC) 10 MG tablet, Take 10 mg by  mouth daily., Disp: , Rfl:  .  ANORO ELLIPTA 62.5-25 MCG/INH AEPB, Inhale 1 puff into the lungs daily., Disp: , Rfl: 6 .  aspirin 81 MG tablet, Take 81 mg by mouth daily., Disp: , Rfl:  .  carvedilol (COREG) 25 MG tablet, Take 25 mg by mouth 2 (two) times daily with a meal., Disp: , Rfl:  .  gabapentin (NEURONTIN) 300 MG capsule, Take 300 mg by mouth at bedtime., Disp: , Rfl:  .   ibuprofen (ADVIL,MOTRIN) 600 MG tablet, Take 600 mg by mouth every 6 (six) hours as needed., Disp: , Rfl:  .  lidocaine-prilocaine (EMLA) cream, Apply to affected area once, Disp: 30 g, Rfl: 3 .  lisinopril (PRINIVIL,ZESTRIL) 40 MG tablet, Take 40 mg by mouth daily., Disp: , Rfl:  .  omeprazole (PRILOSEC) 20 MG capsule, Take 20 mg by mouth 2 (two) times daily before a meal., Disp: , Rfl:  .  ondansetron (ZOFRAN) 8 MG tablet, Take 1 tablet (8 mg total) by mouth 2 (two) times daily as needed for refractory nausea / vomiting., Disp: 30 tablet, Rfl: 1 .  potassium chloride (K-DUR,KLOR-CON) 10 MEQ tablet, Take 10 mEq by mouth daily., Disp: , Rfl:  .  prochlorperazine (COMPAZINE) 10 MG tablet, Take 1 tablet (10 mg total) by mouth every 6 (six) hours as needed (Nausea or vomiting)., Disp: 30 tablet, Rfl: 1 .  sildenafil (VIAGRA) 100 MG tablet, Take 100 mg by mouth as needed for erectile dysfunction., Disp: , Rfl:  .  tamsulosin (FLOMAX) 0.4 MG CAPS capsule, Take 0.4 mg by mouth daily., Disp: , Rfl:    Allergies:  Penicillins  Review of Systems: Gen:  Denies  fever, sweats, chills HEENT: Denies blurred vision, double vision. bleeds, sore throat Cvc:  No dizziness, chest pain. Resp:   Denies cough or sputum production, shortness of breath Gi: Denies swallowing difficulty, stomach pain. Gu:  Denies bladder incontinence, burning urine Ext:   No Joint pain, stiffness. Skin: No skin rash,  hives  Endoc:  No polyuria, polydipsia. Psych: No depression, insomnia. Other:  All other systems were reviewed with the patient and were negative other that what is mentioned in the HPI.   Physical Examination:   VS: BP (!) 142/78 (BP Location: Left Arm, Cuff Size: Normal)   Pulse 75   Resp 16   Ht 6' (1.829 m)   Wt 202 lb (91.6 kg)   SpO2 100%   BMI 27.40 kg/m   General Appearance: No distress  Neuro:without focal findings,  speech normal,  HEENT: PERRLA, EOM intact.   Pulmonary: normal breath sounds,  No wheezing.  CardiovascularNormal S1,S2.  No m/r/g.   Abdomen: Benign, Soft, non-tender. Pt has skin abcess over right scapula.  Renal:  No costovertebral tenderness  GU:  No performed at this time. Endoc: No evident thyromegaly, no signs of acromegaly. Skin:   warm, no rashes, no ecchymosis  Extremities: normal, no cyanosis, clubbing.  Other findings:    LABORATORY PANEL:   CBC No results for input(s): WBC, HGB, HCT, PLT in the last 168 hours. ------------------------------------------------------------------------------------------------------------------  Chemistries  No results for input(s): NA, K, CL, CO2, GLUCOSE, BUN, CREATININE, CALCIUM, MG, AST, ALT, ALKPHOS, BILITOT in the last 168 hours.  Invalid input(s): GFRCGP ------------------------------------------------------------------------------------------------------------------  Cardiac Enzymes No results for input(s): TROPONINI in the last 168 hours. ------------------------------------------------------------  RADIOLOGY:  No results found.     Thank  you for the consultation and for allowing [facility] to assist in  the care of your patient. Our recommendations are noted above.  Please contact us if we can be of further service.   Marda Stalker, MD.  Board Certified in Internal Medicine, Pulmonary Medicine, [place], and Sleep Medicine.  [facility] Office Number: [phone]  Patricia Pesa, M.D.  Merton Border, M.D  12/16/2017

## 2017-12-17 ENCOUNTER — Encounter: Payer: Self-pay | Admitting: *Deleted

## 2017-12-17 ENCOUNTER — Ambulatory Visit
Admission: RE | Admit: 2017-12-17 | Discharge: 2017-12-17 | Disposition: A | Discharge status: Home / Self Care | Payer: Medicare Other | Source: Ambulatory Visit | Attending: Radiation Oncology | Admitting: Radiation Oncology

## 2017-12-17 ENCOUNTER — Encounter: Payer: Self-pay | Admitting: Internal Medicine

## 2017-12-17 ENCOUNTER — Encounter: Payer: Self-pay | Admitting: Radiation Oncology

## 2017-12-17 ENCOUNTER — Ambulatory Visit: Payer: Medicare Other | Admitting: Internal Medicine

## 2017-12-17 ENCOUNTER — Other Ambulatory Visit: Payer: Self-pay

## 2017-12-17 VITALS — BP 142/78 | HR 75 | Resp 16 | Ht 72.0 in | Wt 202.0 lb

## 2017-12-17 VITALS — BP 158/67 | HR 78 | Temp 97.6°F | Ht 72.0 in | Wt 207.8 lb

## 2017-12-17 DIAGNOSIS — Z808 Family history of malignant neoplasm of other organs or systems: Secondary | ICD-10-CM | POA: Diagnosis not present

## 2017-12-17 DIAGNOSIS — J45909 Unspecified asthma, uncomplicated: Secondary | ICD-10-CM | POA: Insufficient documentation

## 2017-12-17 DIAGNOSIS — Z7982 Long term (current) use of aspirin: Secondary | ICD-10-CM | POA: Diagnosis not present

## 2017-12-17 DIAGNOSIS — Z87891 Personal history of nicotine dependence: Secondary | ICD-10-CM | POA: Insufficient documentation

## 2017-12-17 DIAGNOSIS — C3411 Malignant neoplasm of upper lobe, right bronchus or lung: Secondary | ICD-10-CM | POA: Diagnosis not present

## 2017-12-17 DIAGNOSIS — Z880 Allergy status to penicillin: Secondary | ICD-10-CM | POA: Diagnosis not present

## 2017-12-17 DIAGNOSIS — I10 Essential (primary) hypertension: Secondary | ICD-10-CM | POA: Diagnosis not present

## 2017-12-17 DIAGNOSIS — Z803 Family history of malignant neoplasm of breast: Secondary | ICD-10-CM | POA: Insufficient documentation

## 2017-12-17 DIAGNOSIS — C3491 Malignant neoplasm of unspecified part of right bronchus or lung: Secondary | ICD-10-CM

## 2017-12-17 DIAGNOSIS — R05 Cough: Secondary | ICD-10-CM | POA: Diagnosis present

## 2017-12-17 DIAGNOSIS — Z8249 Family history of ischemic heart disease and other diseases of the circulatory system: Secondary | ICD-10-CM | POA: Insufficient documentation

## 2017-12-17 DIAGNOSIS — Z79899 Other long term (current) drug therapy: Secondary | ICD-10-CM | POA: Insufficient documentation

## 2017-12-17 DIAGNOSIS — Z833 Family history of diabetes mellitus: Secondary | ICD-10-CM | POA: Diagnosis not present

## 2017-12-17 DIAGNOSIS — K219 Gastro-esophageal reflux disease without esophagitis: Secondary | ICD-10-CM | POA: Insufficient documentation

## 2017-12-17 DIAGNOSIS — R0602 Shortness of breath: Secondary | ICD-10-CM | POA: Diagnosis present

## 2017-12-17 DIAGNOSIS — R918 Other nonspecific abnormal finding of lung field: Secondary | ICD-10-CM | POA: Diagnosis not present

## 2017-12-17 MED ORDER — ANORO ELLIPTA 62.5-25 MCG/INH IN AEPB: 1.0000 | INHALATION_SPRAY | Freq: Every day | RESPIRATORY_TRACT | 6 refills | Status: DC

## 2017-12-17 NOTE — Patient Instructions (Signed)

## 2017-12-17 NOTE — Patient Instructions (Addendum)
Continue to follow up with Dr. Raul Del.  Restart Anoro.  Make sure you take care of the abcess on your back soon.

## 2017-12-17 NOTE — Consult Note (Signed)
NEW PATIENT EVALUATION  Name: Mark Mccarty  MRN: [id number]  Date:   12/17/2017     DOB: Nov 21, 1950   This 67 y.o. 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 Lung Cancer    Initial Eval    DIAGNOSIS: The encounter diagnosis was Primary lung adenocarcinoma, right ([place]).   PREVIOUS INVESTIGATIONS:  PET CT and CT scans reviewed Pathology reports reviewed Clinical notes reviewed  HPI: patient is a 67 year old male who presented with increasing shortness of breath slight cough and dyspnea on exertion. He has had a cardiac arrhythmia asked his cardiologist workup his lung and was found to have a right pper lobe lung nodule. He has about a 50-pack-year smoking history. CT scan confirmed a 2.6 cm right upper lobe mass with right hilar and right paratracheal nodal metastasis. PET CT scan demonstrated hypermetabolic ACTIVITY in the right upper lobe as well as ipsilateral right hilar and mediastinal adenopathy. He underwent bronchoscopy with one of the node showing metastatic adenocarcinoma consistent with lung primary.MRI of the brain was normal.Patient has been seen by medical oncology and recognition for concurrent chemoradiation therapy was made. He is doing fairly well he is in no pain. He has no dysphagia. He has a slight cough and mild hemoptysis since the bronchoscopy.  PLANNED TREATMENT REGIMEN: concurrent chemoradiation  PAST MEDICAL HISTORY:  has a past medical history of Asthma, Cataract, Dyspnea, [name], GERD (gastroesophageal reflux disease), Hypertension, and Neuromuscular disorder ([place]).    PAST SURGICAL HISTORY:  Past Surgical History:  Procedure Laterality Date  . CARDIAC CATHETERIZATION N/A 12/16/2014   Procedure: Left Heart Cath;  Surgeon: [name], MD;  Location: [facility];  Service: Cardiovascular;  Laterality: N/A;  . CYST REMOVAL TRUNK     chest and back over time and it was removed  . ENDOBRONCHIAL ULTRASOUND N/A 12/09/2017   Procedure: ENDOBRONCHIAL ULTRASOUND;  Surgeon: [name], MD;  Location: [name];  Service: Pulmonary;  Laterality: N/A;  . left cataract surgery      FAMILY HISTORY: family history includes Breast cancer in his mother; Colon cancer in his sister; Diabetes in his sister; Heart attack in his mother; Heart disease in his sister; Hypertension in his mother; Lung cancer in his father.  SOCIAL HISTORY:  reports that he quit smoking about 2 years ago. His smoking use included cigarettes. He has a 52.50 pack-year smoking history. He has never used smokeless tobacco. He reports that he drinks alcohol. He reports that he does not use drugs.  ALLERGIES: Penicillins  MEDICATIONS:  Current Outpatient Medications  Medication Sig Dispense Refill  . acetaminophen (TYLENOL) 500 MG tablet Take 500 mg by mouth as needed.    [name] albuterol (PROVENTIL HFA;VENTOLIN HFA) 108 (90 Base) MCG/ACT inhaler Inhale 2 puffs into the lungs every 6 (six) hours as needed.  12  . amLODipine (NORVASC) 10 MG tablet Take 10 mg by mouth daily.    Jearl Klinefelter ELLIPTA 62.5-25 MCG/INH AEPB Inhale 1 puff into the lungs daily. 60 each 6  . aspirin 81 MG tablet Take 81 mg by mouth daily.    . carvedilol (COREG) 25 MG tablet Take 25 mg by mouth 2 (two) times daily with a meal.    . gabapentin (NEURONTIN) 300 MG capsule Take 300 mg by mouth at bedtime.    [name]  ibuprofen (ADVIL,MOTRIN) 600 MG tablet Take 600 mg by mouth every 6 (six) hours as needed.    . lidocaine-prilocaine (EMLA) cream Apply to affected area once 30 g 3  . lisinopril (PRINIVIL,ZESTRIL) 40 MG tablet Take 40 mg by mouth daily.    [name] omeprazole (PRILOSEC) 20 MG capsule Take 20 mg by mouth 2 (two) times daily before a meal.    . ondansetron (ZOFRAN) 8 MG tablet Take 1 tablet (8 mg total) by mouth 2 (two)  times daily as needed for refractory nausea / vomiting. 30 tablet 1  . potassium chloride (K-DUR,KLOR-CON) 10 MEQ tablet Take 10 mEq by mouth daily.    . prochlorperazine (COMPAZINE) 10 MG tablet Take 1 tablet (10 mg total) by mouth every 6 (six) hours as needed (Nausea or vomiting). 30 tablet 1  . sildenafil (VIAGRA) 100 MG tablet Take 100 mg by mouth as needed for erectile dysfunction.    . tamsulosin (FLOMAX) 0.4 MG CAPS capsule Take 0.4 mg by mouth daily.     No current facility-administered medications for this encounter.     ECOG PERFORMANCE STATUS:  0 - Asymptomatic  REVIEW OF SYSTEMS:  Patient denies any weight loss, fatigue, weakness, fever, chills or night sweats. Patient denies any loss of vision, blurred vision. Patient denies any ringing  of the ears or hearing loss. No irregular heartbeat. Patient denies heart murmur or history of fainting. Patient denies any chest pain or pain radiating to her upper extremities. Patient denies any shortness of breath, difficulty breathing at night, cough or hemoptysis. Patient denies any swelling in the lower legs. Patient denies any nausea vomiting, vomiting of blood, or coffee ground material in the vomitus. Patient denies any stomach pain. Patient states has had normal bowel movements no significant constipation or diarrhea. Patient denies any dysuria, hematuria or significant nocturia. Patient denies any problems walking, swelling in the joints or loss of balance. Patient denies any skin changes, loss of hair or loss of weight. Patient denies any excessive worrying or anxiety or significant depression. Patient denies any problems with insomnia. Patient denies excessive thirst, polyuria, polydipsia. Patient denies any swollen glands, patient denies easy bruising or easy bleeding. Patient denies any recent infections, allergies or URI. Patient "s visual fields have not changed significantly in recent time.    PHYSICAL EXAM: BP (!) 158/67   Pulse 78    Temp 97.6 F (36.4 C)   Ht 6' (1.829 m)   Wt 207 lb 12.5 oz (94.3 kg)   BMI 28.18 kg/m  Well-developed well-nourished patient in NAD. HEENT reveals PERLA, EOMI, discs not visualized.  Oral cavity is clear. No oral mucosal lesions are identified. Neck is clear without evidence of cervical or supraclavicular adenopathy. Lungs are clear to A&P. Cardiac examination is essentially unremarkable with regular rate and rhythm without murmur rub or thrill. Abdomen is benign with no organomegaly or masses noted. Motor sensory and DTR levels are equal and symmetric in the upper and lower extremities. Cranial nerves II through XII are grossly intact. Proprioception is intact. No peripheral adenopathy or edema is identified. No motor or sensory levels are noted. Crude visual fields are within normal range.  LABORATORY DATA: cytology reports reviewed    RADIOLOGY RESULTS:brain MRI CT scan of chest and PET/CT scan all reviewed and compatible with the above-stated findings   IMPRESSION: stage IIIa (T3 N1 M0) adenocarcinoma the right upper lobe in 67 year old male  PLAN: at this time I agree with concurrent chemotherapy radiation therapy with curative  intent. Would plan on delivering 7000 cGy using IM RT treatment planning and delivery to both the right upper lobe mass as well as his hilar adenopathy. Would use breathing restriction protocol for treatment planning as well as PET CT fusion study. I which use I MRT based onprobable profile of decrease dose to esophagus heart spinal cord. Risks and benefits of treatment including possible radiation esophagitis fatigue alteration of blood counts cough skin reaction all were discussed in detail with the patient.There will be extra effort by both professional staff as well as technical staff to coordinate and manage concurrent chemoradiation and ensuing side effects during his treatments.  Patient seems to comprehend my treatment plan well. I personally ordered and  scheduled CT simulation for later this week. Patient knows to call with any concerns or questions.  I would like to take this opportunity to thank you for allowing me to participate in the care of your patient.[name], MD

## 2017-12-17 NOTE — Addendum Note (Signed)
Addended by: Stephanie Coup on: 12/17/2017 10:32 AM   Modules accepted: Orders

## 2017-12-18 ENCOUNTER — Inpatient Hospital Stay: Payer: Medicare Other

## 2017-12-18 ENCOUNTER — Encounter (INDEPENDENT_AMBULATORY_CARE_PROVIDER_SITE_OTHER): Payer: Self-pay

## 2017-12-18 ENCOUNTER — Encounter: Payer: Self-pay | Admitting: *Deleted

## 2017-12-18 ENCOUNTER — Other Ambulatory Visit (INDEPENDENT_AMBULATORY_CARE_PROVIDER_SITE_OTHER): Payer: Self-pay | Admitting: Vascular Surgery

## 2017-12-18 NOTE — Progress Notes (Signed)
  Oncology Nurse Navigator Documentation  Navigator Location: [facility] (12/17/17 1400)   )Navigator Encounter Type: Initial RadOnc (12/17/17 1400)     Confirmed Diagnosis Date: 12/12/17 (12/17/17 1400)                 Treatment Phase: Pre-Tx/Tx Discussion (12/17/17 1400) Barriers/Navigation Needs: Education;Coordination of Care (12/17/17 1400) Education: Understanding Cancer/ Treatment Options;Newly Diagnosed Cancer Education (12/17/17 1400) Interventions: Coordination of Care;Education (12/17/17 1400)   Coordination of Care: Appts;Chemo (12/17/17 1400) Education Method: Verbal;Written (12/17/17 1400)       met with patient during initial rad-onc consultation with Dr. Baruch Gouty this afternoon. All questions answered during the time of visit. Pt given resources regarding diagnosis and supportive services available. Pt became tearful. Comfort and reassurance provided. Pt informed that there is a Theatre manager that he can speak with if desired. Pt given her contact info and encouraged to call if needed counseling services. Reviewed upcoming appts and informed that will follow up with him again at his simulation appt to schedule his first chemo visit. Pt verbalized understanding. Instructed to call if has any further questions or needs.          Time Spent with Patient: 30 (12/17/17 1400)

## 2017-12-20 ENCOUNTER — Ambulatory Visit
Admission: RE | Admit: 2017-12-20 | Discharge: 2017-12-20 | Disposition: A | Discharge status: Home / Self Care | Payer: Medicare Other | Source: Ambulatory Visit | Attending: Radiation Oncology | Admitting: Radiation Oncology

## 2017-12-20 DIAGNOSIS — Z79899 Other long term (current) drug therapy: Secondary | ICD-10-CM | POA: Diagnosis not present

## 2017-12-20 DIAGNOSIS — Z510 Encounter for antineoplastic radiation therapy: Secondary | ICD-10-CM | POA: Diagnosis present

## 2017-12-20 DIAGNOSIS — Z87891 Personal history of nicotine dependence: Secondary | ICD-10-CM | POA: Diagnosis not present

## 2017-12-20 DIAGNOSIS — K219 Gastro-esophageal reflux disease without esophagitis: Secondary | ICD-10-CM | POA: Insufficient documentation

## 2017-12-20 DIAGNOSIS — G709 Myoneural disorder, unspecified: Secondary | ICD-10-CM | POA: Diagnosis not present

## 2017-12-20 DIAGNOSIS — I10 Essential (primary) hypertension: Secondary | ICD-10-CM | POA: Diagnosis not present

## 2017-12-20 DIAGNOSIS — E876 Hypokalemia: Secondary | ICD-10-CM | POA: Diagnosis not present

## 2017-12-20 DIAGNOSIS — J45909 Unspecified asthma, uncomplicated: Secondary | ICD-10-CM | POA: Insufficient documentation

## 2017-12-20 DIAGNOSIS — C3411 Malignant neoplasm of upper lobe, right bronchus or lung: Secondary | ICD-10-CM | POA: Diagnosis present

## 2017-12-22 MED ORDER — CLINDAMYCIN PHOSPHATE 300 MG/50ML IV SOLN: 300.0000 mg | Freq: Once | INTRAVENOUS | Status: DC

## 2017-12-23 ENCOUNTER — Encounter
Admission: RE | Disposition: A | Discharge status: Home / Self Care | Payer: Self-pay | Source: Ambulatory Visit | Attending: Vascular Surgery

## 2017-12-23 ENCOUNTER — Ambulatory Visit
Admission: RE | Admit: 2017-12-23 | Discharge: 2017-12-23 | Disposition: A | Discharge status: Home / Self Care | Payer: Medicare Other | Source: Ambulatory Visit | Attending: Vascular Surgery | Admitting: Vascular Surgery

## 2017-12-23 ENCOUNTER — Encounter: Payer: Self-pay | Admitting: *Deleted

## 2017-12-23 DIAGNOSIS — C3490 Malignant neoplasm of unspecified part of unspecified bronchus or lung: Secondary | ICD-10-CM | POA: Diagnosis not present

## 2017-12-23 DIAGNOSIS — Z800 Family history of malignant neoplasm of digestive organs: Secondary | ICD-10-CM | POA: Insufficient documentation

## 2017-12-23 DIAGNOSIS — Z880 Allergy status to penicillin: Secondary | ICD-10-CM | POA: Insufficient documentation

## 2017-12-23 DIAGNOSIS — Z801 Family history of malignant neoplasm of trachea, bronchus and lung: Secondary | ICD-10-CM | POA: Insufficient documentation

## 2017-12-23 DIAGNOSIS — K219 Gastro-esophageal reflux disease without esophagitis: Secondary | ICD-10-CM | POA: Insufficient documentation

## 2017-12-23 DIAGNOSIS — I10 Essential (primary) hypertension: Secondary | ICD-10-CM | POA: Insufficient documentation

## 2017-12-23 DIAGNOSIS — Z9889 Other specified postprocedural states: Secondary | ICD-10-CM | POA: Insufficient documentation

## 2017-12-23 DIAGNOSIS — Z803 Family history of malignant neoplasm of breast: Secondary | ICD-10-CM | POA: Insufficient documentation

## 2017-12-23 DIAGNOSIS — C3491 Malignant neoplasm of unspecified part of right bronchus or lung: Secondary | ICD-10-CM | POA: Diagnosis not present

## 2017-12-23 DIAGNOSIS — H269 Unspecified cataract: Secondary | ICD-10-CM | POA: Insufficient documentation

## 2017-12-23 DIAGNOSIS — Z87891 Personal history of nicotine dependence: Secondary | ICD-10-CM | POA: Insufficient documentation

## 2017-12-23 DIAGNOSIS — Z8249 Family history of ischemic heart disease and other diseases of the circulatory system: Secondary | ICD-10-CM | POA: Diagnosis not present

## 2017-12-23 DIAGNOSIS — I499 Cardiac arrhythmia, unspecified: Secondary | ICD-10-CM | POA: Diagnosis not present

## 2017-12-23 DIAGNOSIS — Z79899 Other long term (current) drug therapy: Secondary | ICD-10-CM | POA: Diagnosis not present

## 2017-12-23 DIAGNOSIS — J45909 Unspecified asthma, uncomplicated: Secondary | ICD-10-CM | POA: Insufficient documentation

## 2017-12-23 HISTORY — PX: PORTA CATH INSERTION: CATH118285

## 2017-12-23 HISTORY — DX: Malignant (primary) neoplasm, unspecified: C80.1

## 2017-12-23 SURGERY — PORTA CATH INSERTION
Anesthesia: Moderate Sedation

## 2017-12-23 MED ORDER — SODIUM CHLORIDE 0.9 % IV SOLN
Freq: Once | INTRAVENOUS | Status: DC
  Filled 2017-12-23: qty 2

## 2017-12-23 MED ORDER — FENTANYL CITRATE (PF) 100 MCG/2ML IJ SOLN
INTRAMUSCULAR | Status: DC | PRN
  Administered 2017-12-23 (×2): 25 ug via INTRAVENOUS
  Administered 2017-12-23: 50 ug via INTRAVENOUS
  Administered 2017-12-23: 25 ug via INTRAVENOUS

## 2017-12-23 MED ORDER — FENTANYL CITRATE (PF) 100 MCG/2ML IJ SOLN
INTRAMUSCULAR | Status: AC
  Filled 2017-12-23: qty 2

## 2017-12-23 MED ORDER — MIDAZOLAM HCL 2 MG/2ML IJ SOLN
INTRAMUSCULAR | Status: AC
  Filled 2017-12-23: qty 2

## 2017-12-23 MED ORDER — MIDAZOLAM HCL 2 MG/2ML IJ SOLN
INTRAMUSCULAR | Status: DC | PRN
  Administered 2017-12-23: 2 mg via INTRAVENOUS
  Administered 2017-12-23 (×3): 1 mg via INTRAVENOUS

## 2017-12-23 MED ORDER — HEPARIN (PORCINE) IN NACL 1000-0.9 UT/500ML-% IV SOLN
INTRAVENOUS | Status: AC
  Filled 2017-12-23: qty 500

## 2017-12-23 MED ORDER — MIDAZOLAM HCL 5 MG/5ML IJ SOLN
INTRAMUSCULAR | Status: AC
  Filled 2017-12-23: qty 5

## 2017-12-23 MED ORDER — SODIUM CHLORIDE 0.9 % IV SOLN
INTRAVENOUS | Status: DC
  Administered 2017-12-23: 09:00:00 via INTRAVENOUS

## 2017-12-23 MED ORDER — CLINDAMYCIN PHOSPHATE 300 MG/50ML IV SOLN
INTRAVENOUS | Status: AC
  Administered 2017-12-23: 300 mg
  Filled 2017-12-23: qty 50

## 2017-12-23 MED ORDER — LIDOCAINE-EPINEPHRINE (PF) 1 %-1:200000 IJ SOLN
INTRAMUSCULAR | Status: AC
  Filled 2017-12-23: qty 30

## 2017-12-23 SURGICAL SUPPLY — 10 items
KIT PORT POWER 8FR ISP CVUE (Port) ×3 IMPLANT
PACK ANGIOGRAPHY (CUSTOM PROCEDURE TRAY) ×3 IMPLANT
PAD GROUND ADULT SPLIT (MISCELLANEOUS) ×3 IMPLANT
PENCIL ELECTRO HAND CTR (MISCELLANEOUS) ×3 IMPLANT
SHEATH PEELAWAY SET 9 (SHEATH) ×3 IMPLANT
SUT MNCRL AB 4-0 PS2 18 (SUTURE) ×3 IMPLANT
SUT PROLENE 0 CT 1 30 (SUTURE) ×6 IMPLANT
SUT VIC AB 3-0 SH 27 (SUTURE) ×2
SUT VIC AB 3-0 SH 27X BRD (SUTURE) ×1 IMPLANT
WIRE MAGIC TOR.035 180C (WIRE) ×3 IMPLANT

## 2017-12-23 NOTE — H&P (Signed)
[facility] History & Physical Update  The patient was interviewed and re-examined.  The patient's previous History and Physical has been reviewed and is unchanged.  There is no change in the plan of care. We plan to proceed with the scheduled procedure.  Leotis Pain, MD  12/23/2017, 8:21 AM

## 2017-12-23 NOTE — Discharge Instructions (Signed)
Implanted Port Insertion, Care After °This sheet gives you information about how to care for yourself after your procedure. Your health care provider may also give you more specific instructions. If you have problems or questions, contact your health care provider. °What can I expect after the procedure? °After your procedure, it is common to have: °· Discomfort at the port insertion site. °· Bruising on the skin over the port. This should improve over 3-4 days. ° °Follow these instructions at home: °Port care °· After your port is placed, you will get a manufacturer's information card. The card has information about your port. Keep this card with you at all times. °· Take care of the port as told by your health care provider. Ask your health care provider if you or a family member can get training for taking care of the port at home. A home health care nurse may also take care of the port. °· Make sure to remember what type of port you have. °Incision care °· Follow instructions from your health care provider about how to take care of your port insertion site. Make sure you: °? Wash your hands with soap and water before you change your bandage (dressing). If soap and water are not available, use hand sanitizer. °? Change your dressing as told by your health care provider. °? Leave stitches (sutures), skin glue, or adhesive strips in place. These skin closures may need to stay in place for 2 weeks or longer. If adhesive strip edges start to loosen and curl up, you may trim the loose edges. Do not remove adhesive strips completely unless your health care provider tells you to do that. °· Check your port insertion site every day for signs of infection. Check for: °? More redness, swelling, or pain. °? More fluid or blood. °? Warmth. °? Pus or a bad smell. °General instructions °· Do not take baths, swim, or use a hot tub until your health care provider approves. °· Do not lift anything that is heavier than 10 lb (4.5  kg) for a week, or as told by your health care provider. °· Ask your health care provider when it is okay to: °? Return to work or school. °? Resume usual physical activities or sports. °· Do not drive for 24 hours if you were given a medicine to help you relax (sedative). °· Take over-the-counter and prescription medicines only as told by your health care provider. °· Wear a medical alert bracelet in case of an emergency. This will tell any health care providers that you have a port. °· Keep all follow-up visits as told by your health care provider. This is important. °Contact a health care provider if: °· You cannot flush your port with saline as directed, or you cannot draw blood from the port. °· You have a fever or chills. °· You have more redness, swelling, or pain around your port insertion site. °· You have more fluid or blood coming from your port insertion site. °· Your port insertion site feels warm to the touch. °· You have pus or a bad smell coming from the port insertion site. °Get help right away if: °· You have chest pain or shortness of breath. °· You have bleeding from your port that you cannot control. °Summary °· Take care of the port as told by your health care provider. °· Change your dressing as told by your health care provider. °· Keep all follow-up visits as told by your health care provider. °  This information is not intended to replace advice given to you by your health care provider. Make sure you discuss any questions you have with your health care provider. °Document Released: 04/08/2013 Document Revised: 05/09/2016 Document Reviewed: 05/09/2016 °Elsevier Interactive Patient Education © 2017 [employer] ° °

## 2017-12-23 NOTE — Op Note (Signed)
      [facility]       Operative Note  Date: 12/23/2017  Preoperative diagnosis:  1. Lung cancer  Postoperative diagnosis:  Same as above  Procedures: #1. Ultrasound guidance for vascular access to the left internal jugular vein. #2. Fluoroscopic guidance for placement of catheter. #3. Placement of CT compatible Port-A-Cath, left internal jugular vein.  Surgeon: Leotis Pain, MD.   Anesthesia: Local with moderate conscious sedation for approximately 30  minutes using 4 mg of Versed and 100 mcg of Fentanyl  Fluoroscopy time: 3.3 minutes  Contrast used: 0  Estimated blood loss: 10 cc  Indication for the procedure:  The patient is a 67 y.o.male with lung cancer.  The patient needs a Port-A-Cath for durable venous access, chemotherapy, lab draws, and CT scans. We are asked to place this. Risks and benefits were discussed and informed consent was obtained.  Description of procedure: The patient was brought to the vascular and interventional radiology suite.  Moderate conscious sedation was administered throughout the procedure during a face to face encounter with the patient with my supervision of the RN administering medicines and monitoring the patient's vital signs, pulse oximetry, telemetry and mental status throughout from the start of the procedure until the patient was taken to the recovery room. The left neck chest and shoulder were sterilely prepped and draped, and a sterile surgical field was created. Ultrasound was used to help visualize a patent left internal jugular vein. This was then accessed under direct ultrasound guidance without difficulty with the Seldinger needle and a permanent image was recorded. A J-wire was placed. After skin nick and dilatation, the peel-away sheath was then placed over the wire. I then anesthetized an area under the clavicle approximately 1-2 fingerbreadths. A transverse incision was created and an inferior pocket was created with  electrocautery and blunt dissection. The port was then brought onto the field, placed into the pocket and secured to the chest wall with 2 Prolene sutures. The catheter was connected to the port and tunneled from the subclavicular incision to the access site. Fluoroscopic guidance was then used to cut the catheter to an appropriate length. The catheter was then placed through the peel-away sheath and the peel-away sheath was removed. Initially, the catheter terminated in the distal SVC in suboptimal location.  I then rewired the catheter and used a new catheter cut a few cm longer. This was placed over the wire and the wire was removed. The catheter tip was parked in excellent location under fluorocoscopic guidance in the right atrium. The pocket was then irrigated with antibiotic impregnated saline and the wound was closed with a running 3-0 Vicryl and a 4-0 Monocryl. The access incision was closed with a single 4-0 Monocryl. The Huber needle was used to withdraw blood and flush the port with heparinized saline. Dermabond was then placed as a dressing. The patient tolerated the procedure well and was taken to the recovery room in stable condition.   Leotis Pain 12/23/2017 10:58 AM   This note was created with Dragon Medical transcription system. Any errors in dictation are purely unintentional.

## 2017-12-25 DIAGNOSIS — Z510 Encounter for antineoplastic radiation therapy: Secondary | ICD-10-CM | POA: Diagnosis not present

## 2017-12-30 ENCOUNTER — Ambulatory Visit
Admission: RE | Admit: 2017-12-30 | Discharge: 2017-12-30 | Disposition: A | Discharge status: Home / Self Care | Payer: Medicare Other | Source: Ambulatory Visit | Attending: Radiation Oncology | Admitting: Radiation Oncology

## 2017-12-30 DIAGNOSIS — C3411 Malignant neoplasm of upper lobe, right bronchus or lung: Secondary | ICD-10-CM | POA: Insufficient documentation

## 2017-12-30 DIAGNOSIS — Z510 Encounter for antineoplastic radiation therapy: Secondary | ICD-10-CM | POA: Insufficient documentation

## 2017-12-31 ENCOUNTER — Other Ambulatory Visit: Payer: Self-pay

## 2017-12-31 ENCOUNTER — Encounter: Payer: Self-pay | Admitting: *Deleted

## 2017-12-31 ENCOUNTER — Inpatient Hospital Stay (HOSPITAL_BASED_OUTPATIENT_CLINIC_OR_DEPARTMENT_OTHER): Payer: Medicare Other | Admitting: Oncology

## 2017-12-31 ENCOUNTER — Inpatient Hospital Stay: Payer: Medicare Other

## 2017-12-31 ENCOUNTER — Encounter: Payer: Self-pay | Admitting: Oncology

## 2017-12-31 ENCOUNTER — Ambulatory Visit
Admission: RE | Admit: 2017-12-31 | Discharge: 2017-12-31 | Disposition: A | Discharge status: Home / Self Care | Payer: Medicare Other | Source: Ambulatory Visit | Attending: Radiation Oncology | Admitting: Radiation Oncology

## 2017-12-31 ENCOUNTER — Inpatient Hospital Stay: Payer: Medicare Other | Attending: Oncology

## 2017-12-31 VITALS — BP 144/82 | HR 74 | Temp 97.1°F | Resp 18 | Wt 204.6 lb

## 2017-12-31 VITALS — BP 145/79 | HR 67 | Temp 97.9°F | Resp 18

## 2017-12-31 DIAGNOSIS — R0602 Shortness of breath: Secondary | ICD-10-CM | POA: Insufficient documentation

## 2017-12-31 DIAGNOSIS — E876 Hypokalemia: Secondary | ICD-10-CM | POA: Diagnosis not present

## 2017-12-31 DIAGNOSIS — K5900 Constipation, unspecified: Secondary | ICD-10-CM | POA: Diagnosis not present

## 2017-12-31 DIAGNOSIS — Z5111 Encounter for antineoplastic chemotherapy: Secondary | ICD-10-CM

## 2017-12-31 DIAGNOSIS — C3491 Malignant neoplasm of unspecified part of right bronchus or lung: Secondary | ICD-10-CM | POA: Insufficient documentation

## 2017-12-31 DIAGNOSIS — C771 Secondary and unspecified malignant neoplasm of intrathoracic lymph nodes: Secondary | ICD-10-CM | POA: Insufficient documentation

## 2017-12-31 DIAGNOSIS — Z79899 Other long term (current) drug therapy: Secondary | ICD-10-CM | POA: Diagnosis not present

## 2017-12-31 DIAGNOSIS — Z87891 Personal history of nicotine dependence: Secondary | ICD-10-CM

## 2017-12-31 DIAGNOSIS — G4701 Insomnia due to medical condition: Secondary | ICD-10-CM | POA: Diagnosis not present

## 2017-12-31 DIAGNOSIS — Z510 Encounter for antineoplastic radiation therapy: Secondary | ICD-10-CM | POA: Diagnosis not present

## 2017-12-31 DIAGNOSIS — C3411 Malignant neoplasm of upper lobe, right bronchus or lung: Secondary | ICD-10-CM | POA: Diagnosis not present

## 2017-12-31 LAB — CBC WITH DIFFERENTIAL/PLATELET
BASOS ABS: 0.1 10*3/uL (ref 0–0.1)
BASOS PCT: 1 %
Eosinophils Absolute: 0.1 10*3/uL (ref 0–0.7)
Eosinophils Relative: 1 %
HEMATOCRIT: 44.1 % (ref 40.0–52.0)
HEMOGLOBIN: 15.5 g/dL (ref 13.0–18.0)
LYMPHS PCT: 16 %
Lymphs Abs: 1.4 10*3/uL (ref 1.0–3.6)
MCH: 32.4 pg (ref 26.0–34.0)
MCHC: 35.1 g/dL (ref 32.0–36.0)
MCV: 92.2 fL (ref 80.0–100.0)
MONO ABS: 1 10*3/uL (ref 0.2–1.0)
MONOS PCT: 11 %
NEUTROS ABS: 6.6 10*3/uL — AB (ref 1.4–6.5)
NEUTROS PCT: 71 %
Platelets: 264 10*3/uL (ref 150–440)
RBC: 4.78 MIL/uL (ref 4.40–5.90)
RDW: 14 % (ref 11.5–14.5)
WBC: 9.1 10*3/uL (ref 3.8–10.6)

## 2017-12-31 LAB — COMPREHENSIVE METABOLIC PANEL
ALBUMIN: 4 g/dL (ref 3.5–5.0)
ALT: 15 U/L (ref 0–44)
AST: 20 U/L (ref 15–41)
Alkaline Phosphatase: 62 U/L (ref 38–126)
Anion gap: 12 (ref 5–15)
BUN: 16 mg/dL (ref 8–23)
CHLORIDE: 101 mmol/L (ref 98–111)
CO2: 25 mmol/L (ref 22–32)
Calcium: 9.2 mg/dL (ref 8.9–10.3)
Creatinine, Ser: 0.88 mg/dL (ref 0.61–1.24)
GFR calc non Af Amer: >60 mL/min (ref >60)
GLUCOSE: 119 mg/dL — AB (ref 70–99)
POTASSIUM: 3.2 mmol/L — AB (ref 3.5–5.1)
SODIUM: 138 mmol/L (ref 135–145)
Total Bilirubin: 0.9 mg/dL (ref 0.3–1.2)
Total Protein: 7.6 g/dL (ref 6.5–8.1)

## 2017-12-31 MED ORDER — FAMOTIDINE IN NACL 20-0.9 MG/50ML-% IV SOLN
20.0000 mg | Freq: Once | INTRAVENOUS | Status: AC
  Administered 2017-12-31: 20 mg via INTRAVENOUS

## 2017-12-31 MED ORDER — DEXAMETHASONE SODIUM PHOSPHATE 100 MG/10ML IJ SOLN
20.0000 mg | Freq: Once | INTRAMUSCULAR | Status: AC
  Administered 2017-12-31: 20 mg via INTRAVENOUS
  Filled 2017-12-31: qty 2

## 2017-12-31 MED ORDER — HEPARIN SOD (PORK) LOCK FLUSH 100 UNIT/ML IV SOLN
500.0000 [IU] | Freq: Once | INTRAVENOUS | Status: AC
  Administered 2017-12-31: 500 [IU] via INTRAVENOUS
  Filled 2017-12-31: qty 5

## 2017-12-31 MED ORDER — SODIUM CHLORIDE 0.9 % IV SOLN
240.0000 mg | Freq: Once | INTRAVENOUS | Status: AC
  Administered 2017-12-31: 240 mg via INTRAVENOUS
  Filled 2017-12-31: qty 24

## 2017-12-31 MED ORDER — POTASSIUM CHLORIDE CRYS ER 10 MEQ PO TBCR: 10.0000 meq | EXTENDED_RELEASE_TABLET | Freq: Every day | ORAL | 0 refills | Status: DC

## 2017-12-31 MED ORDER — SODIUM CHLORIDE 0.9 % IV SOLN
45.0000 mg/m2 | Freq: Once | INTRAVENOUS | Status: AC
  Administered 2017-12-31: 96 mg via INTRAVENOUS
  Filled 2017-12-31: qty 16

## 2017-12-31 MED ORDER — PALONOSETRON HCL INJECTION 0.25 MG/5ML
0.2500 mg | Freq: Once | INTRAVENOUS | Status: AC
Start: 2017-12-31 — End: 2017-12-31
  Administered 2017-12-31: 0.25 mg via INTRAVENOUS
  Filled 2017-12-31: qty 5

## 2017-12-31 MED ORDER — DIPHENHYDRAMINE HCL 50 MG/ML IJ SOLN
50.0000 mg | Freq: Once | INTRAMUSCULAR | Status: AC
  Administered 2017-12-31: 50 mg via INTRAVENOUS
  Filled 2017-12-31: qty 1

## 2017-12-31 MED ORDER — SODIUM CHLORIDE 0.9% FLUSH
10.0000 mL | Freq: Once | INTRAVENOUS | Status: AC
  Administered 2017-12-31: 10 mL via INTRAVENOUS
  Filled 2017-12-31: qty 10

## 2017-12-31 MED ORDER — SODIUM CHLORIDE 0.9 % IV SOLN
Freq: Once | INTRAVENOUS | Status: AC
  Administered 2017-12-31: 12:00:00 via INTRAVENOUS
  Filled 2017-12-31: qty 1000

## 2017-12-31 NOTE — Progress Notes (Signed)
  Oncology Nurse Navigator Documentation  Navigator Location: [facility] (12/31/17 1000)   )Navigator Encounter Type: Treatment (12/31/17 1000)                   Treatment Initiated Date: 12/31/17 (12/31/17 1000) Patient Visit Type: MedOnc;RadOnc (12/31/17 1000) Treatment Phase: First Chemo Tx;First Radiation Tx (12/31/17 1000) Barriers/Navigation Needs: No barriers at this time (12/31/17 1000)   Interventions: None required (12/31/17 1000)                      Time Spent with Patient: 15 (12/31/17 1000)

## 2017-12-31 NOTE — Progress Notes (Addendum)
Hematology/Oncology Follow up note [facility] [phone] [phone]   Patient Care Team: [name], MD as PCP - General (Internal Medicine) [name], RN as Registered Nurse   REASON FOR VISIT Follow up for treatment of Stage III lung adenocarcinoma  Oncology History:  Mark Mccarty is a  67 y.o.  male with adenocarcinoma of lung, diagnosed via biopsy of right hilar lymph nodes.  Former 43 pack year smoking history.  T3 N1 M0  INTERVAL HISTORY Mark Mccarty is a 66 y.o. male who has above history reviewed by me today presents for assessment prior to concurrent chemotherapy with RT.  Patient is starting RT today.  Reports breathing well. No new complaints.  Chronic SOB at baseline.  S/p medi port placement.   Review of Systems  Constitutional: Negative for chills, fever, malaise/fatigue and weight loss.  [name]: Negative for congestion, ear discharge, ear pain, nosebleeds, sinus pain and sore throat.   Eyes: Negative for double vision, photophobia, pain, discharge and redness.  Respiratory: Positive for shortness of breath. Negative for cough, hemoptysis, sputum production and wheezing.   Cardiovascular: Negative for chest pain, palpitations, orthopnea, claudication and leg swelling.  Gastrointestinal: Negative for abdominal pain, blood in stool, constipation, diarrhea, heartburn, melena, nausea and vomiting.  Genitourinary: Negative for dysuria, flank pain, frequency and hematuria.  Musculoskeletal: Negative for back pain, myalgias and neck pain.  Skin: Negative for itching and rash.  Neurological: Negative for dizziness, tingling, tremors, focal weakness, weakness and headaches.  Endo/Heme/Allergies: Negative for environmental allergies. Does not bruise/bleed easily.  Psychiatric/Behavioral: Negative for depression and hallucinations. The patient is not nervous/anxious.     MEDICAL HISTORY:  Past Medical History:    Diagnosis Date  . Asthma   . Cancer ([place])    lung  . Cataract    left cataract surgery  . Dyspnea   . [name]   . GERD (gastroesophageal reflux disease)   . Hypertension   . Neuromuscular disorder ([place])    pt has knees bilateral with tendon issues that cause pain    SURGICAL HISTORY: Past Surgical History:  Procedure Laterality Date  . CARDIAC CATHETERIZATION N/A 12/16/2014   Procedure: Left Heart Cath;  Surgeon: [name], MD;  Location: [place] [facility];  Service: Cardiovascular;  Laterality: N/A;  . CYST REMOVAL TRUNK     chest and back over time and it was removed  . ENDOBRONCHIAL ULTRASOUND N/A 12/09/2017   Procedure: ENDOBRONCHIAL ULTRASOUND;  Surgeon: [name], MD;  Location: [name];  Service: Pulmonary;  Laterality: N/A;  . left cataract surgery    . PORTA CATH INSERTION N/A 12/23/2017   Procedure: PORTA CATH INSERTION;  Surgeon: [name], MD;  Location: [facility];  Service: Cardiovascular;  Laterality: N/A;    SOCIAL HISTORY: Social History   Socioeconomic History  . Marital status: Divorced    Spouse name: Not on file  . Number of children: Not on file  . Years of education: Not on file  . Highest education level: Not on file  Occupational History  . Not on file  Social Needs  . Financial resource strain: Not on file  . Food insecurity:    Worry: Not on file    Inability: Not on file  . Transportation needs:    Medical: Not on file    Non-medical: Not on file  Tobacco Use  . Smoking status: Former Smoker    Packs/day: 1.50    Years: 35.00  Pack years: 52.50    Types: Cigarettes    Last attempt to quit: 06/22/2015    Years since quitting: 2.5  . Smokeless tobacco: Never Used  Substance and Sexual Activity  . Alcohol use: Yes    Comment: a fifth in a week  . Drug use: No  . Sexual activity: Not Currently  Lifestyle  . Physical activity:    Days per week: Not on file    Minutes per session: Not on  file  . Stress: Not on file  Relationships  . Social connections:    Talks on phone: Not on file    Gets together: Not on file    Attends religious service: Not on file    Active member of club or organization: Not on file    Attends meetings of clubs or organizations: Not on file    Relationship status: Not on file  . Intimate partner violence:    Fear of current or ex partner: Not on file    Emotionally abused: Not on file    Physically abused: Not on file    Forced sexual activity: Not on file  Other Topics Concern  . Not on file  Social History Narrative  . Not on file    FAMILY HISTORY: Family History  Problem Relation Age of Onset  . Hypertension Mother   . Breast cancer Mother   . Heart attack Mother   . Lung cancer Father   . Heart disease Sister   . Diabetes Sister   . Colon cancer Sister     ALLERGIES:  is allergic to penicillins.  MEDICATIONS:  Current Outpatient Medications  Medication Sig Dispense Refill  . acetaminophen (TYLENOL) 500 MG tablet Take 500 mg by mouth as needed.    [name] albuterol (PROVENTIL HFA;VENTOLIN HFA) 108 (90 Base) MCG/ACT inhaler Inhale 2 puffs into the lungs every 6 (six) hours as needed.  12  . amLODipine (NORVASC) 10 MG tablet Take 10 mg by mouth daily.    Jearl Klinefelter ELLIPTA 62.5-25 MCG/INH AEPB Inhale 1 puff into the lungs daily. 60 each 6  . aspirin 81 MG tablet Take 81 mg by mouth daily.    . carvedilol (COREG) 25 MG tablet Take 25 mg by mouth 2 (two) times daily with a meal.    . gabapentin (NEURONTIN) 300 MG capsule Take 300 mg by mouth at bedtime.    [name] ibuprofen (ADVIL,MOTRIN) 600 MG tablet Take 600 mg by mouth every 6 (six) hours as needed.    . lidocaine-prilocaine (EMLA) cream Apply to affected area once 30 g 3  . lisinopril (PRINIVIL,ZESTRIL) 40 MG tablet Take 40 mg by mouth daily.    . ondansetron (ZOFRAN) 8 MG tablet Take 1 tablet (8 mg total) by mouth 2 (two) times daily as needed for refractory nausea / vomiting. 30 tablet  1  . prochlorperazine (COMPAZINE) 10 MG tablet Take 1 tablet (10 mg total) by mouth every 6 (six) hours as needed (Nausea or vomiting). 30 tablet 1  . doxycycline (VIBRAMYCIN) 100 MG capsule Take 100 mg by mouth 2 (two) times daily.    [name] omeprazole (PRILOSEC) 20 MG capsule Take 20 mg by mouth 2 (two) times daily before a meal.    . potassium chloride (K-DUR,KLOR-CON) 10 MEQ tablet Take 1 tablet (10 mEq total) by mouth daily. 14 tablet 0  . sildenafil (VIAGRA) 100 MG tablet Take 100 mg by mouth as needed for erectile dysfunction.    . tamsulosin (FLOMAX) 0.4 MG  CAPS capsule Take 0.4 mg by mouth daily.     No current facility-administered medications for this visit.      PHYSICAL EXAMINATION: ECOG PERFORMANCE STATUS: 0 - Asymptomatic Vitals:   12/31/17 1049  BP: (!) 144/82  Pulse: 74  Resp: 18  Temp: (!) 97.1 F (36.2 C)  SpO2: 93%   Filed Weights   12/31/17 1049  Weight: 204 lb 9.6 oz (92.8 kg)    Physical Exam  Constitutional: He is oriented to person, place, and time. He appears well-developed and well-nourished. No distress.  [name]:  Head: Normocephalic and atraumatic.  Right Ear: External ear normal.  Left Ear: External ear normal.  Mouth/Throat: Oropharynx is clear and moist.  Eyes: Pupils are equal, round, and reactive to light. Conjunctivae and EOM are normal. No scleral icterus.  Neck: Normal range of motion. Neck supple.  Cardiovascular: Normal rate, regular rhythm and normal heart sounds.  Pulmonary/Chest: Effort normal and breath sounds normal. No respiratory distress. He has no wheezes. He has no rales. He exhibits no tenderness.  Decreased breath sounds bilaterally  Abdominal: Soft. Bowel sounds are normal. He exhibits no distension and no mass. There is no tenderness.  Musculoskeletal: Normal range of motion. He exhibits no edema or deformity.  Lymphadenopathy:    He has no cervical adenopathy.  Neurological: He is alert and oriented to person, place, and time.  No cranial nerve deficit. Coordination normal.  Skin: Skin is warm and dry. No rash noted.  Psychiatric: He has a normal mood and affect. His behavior is normal. Thought content normal.     LABORATORY DATA:  I have reviewed the data as listed Lab Results  Component Value Date   WBC 9.1 12/31/2017   HGB 15.5 12/31/2017   HCT 44.1 12/31/2017   MCV 92.2 12/31/2017   PLT 264 12/31/2017   Recent Labs    12/06/17 1345 12/31/17 1034  NA 138 138  K 3.1* 3.2*  CL 101 101  CO2 25 25  GLUCOSE 101* 119*  BUN 14 16  CREATININE 0.74 0.88  CALCIUM 9.1 9.2  GFRNONAA >60 >60  GFRAA >60 >60  PROT 7.5 7.6  ALBUMIN 4.2 4.0  AST 18 20  ALT 15* 15  ALKPHOS 51 62  BILITOT 0.8 0.9       ASSESSMENT & PLAN:  1. Primary lung adenocarcinoma, right ([place])   2. Encounter for antineoplastic chemotherapy   3. Hypokalemia   T3 N1 M0 lung adenocarcinoma # Labs reviewed, counts acceptable to start concurrent Carbo and Taxol.  # Hypokalemia: start K dur 8meq daily.  # I explained about the instruction of antiemetics and patient voices understanding.   All questions were answered. The patient knows to call the clinic with any problems questions or concerns.  Return of visit: 1 week for assessment of tolerability and next cycle of chemotherapy.  Total face to face encounter time for this patient visit was 25 min. >50% of the time was  spent in counseling and coordination of care.   [name], MD, PhD Hematology Oncology [facility] at [facility] Pager- [phone] 12/31/2017

## 2017-12-31 NOTE — Progress Notes (Signed)
Patient here for follow up. Feeling a little anxious due to first chemo treatment.

## 2018-01-01 ENCOUNTER — Ambulatory Visit
Admission: RE | Admit: 2018-01-01 | Discharge: 2018-01-01 | Disposition: A | Discharge status: Home / Self Care | Payer: Medicare Other | Source: Ambulatory Visit | Attending: Radiation Oncology | Admitting: Radiation Oncology

## 2018-01-01 DIAGNOSIS — C3411 Malignant neoplasm of upper lobe, right bronchus or lung: Secondary | ICD-10-CM | POA: Diagnosis not present

## 2018-01-03 ENCOUNTER — Ambulatory Visit
Admission: RE | Admit: 2018-01-03 | Discharge: 2018-01-03 | Disposition: A | Discharge status: Home / Self Care | Payer: Medicare Other | Source: Ambulatory Visit | Attending: Radiation Oncology | Admitting: Radiation Oncology

## 2018-01-03 DIAGNOSIS — C3411 Malignant neoplasm of upper lobe, right bronchus or lung: Secondary | ICD-10-CM | POA: Diagnosis not present

## 2018-01-06 ENCOUNTER — Ambulatory Visit
Admission: RE | Admit: 2018-01-06 | Discharge: 2018-01-06 | Disposition: A | Discharge status: Home / Self Care | Payer: Medicare Other | Source: Ambulatory Visit | Attending: Radiation Oncology | Admitting: Radiation Oncology

## 2018-01-06 DIAGNOSIS — C3411 Malignant neoplasm of upper lobe, right bronchus or lung: Secondary | ICD-10-CM | POA: Diagnosis not present

## 2018-01-07 ENCOUNTER — Encounter: Payer: Self-pay | Admitting: Oncology

## 2018-01-07 ENCOUNTER — Inpatient Hospital Stay: Payer: Medicare Other

## 2018-01-07 ENCOUNTER — Other Ambulatory Visit: Payer: Self-pay

## 2018-01-07 ENCOUNTER — Inpatient Hospital Stay (HOSPITAL_BASED_OUTPATIENT_CLINIC_OR_DEPARTMENT_OTHER): Payer: Medicare Other | Admitting: Oncology

## 2018-01-07 ENCOUNTER — Ambulatory Visit
Admission: RE | Admit: 2018-01-07 | Discharge: 2018-01-07 | Disposition: A | Discharge status: Home / Self Care | Payer: Medicare Other | Source: Ambulatory Visit | Attending: Radiation Oncology | Admitting: Radiation Oncology

## 2018-01-07 VITALS — BP 131/73 | HR 72 | Temp 96.1°F | Resp 18 | Wt 208.4 lb

## 2018-01-07 DIAGNOSIS — E876 Hypokalemia: Secondary | ICD-10-CM | POA: Diagnosis not present

## 2018-01-07 DIAGNOSIS — Z5111 Encounter for antineoplastic chemotherapy: Secondary | ICD-10-CM

## 2018-01-07 DIAGNOSIS — R0602 Shortness of breath: Secondary | ICD-10-CM | POA: Diagnosis not present

## 2018-01-07 DIAGNOSIS — C3411 Malignant neoplasm of upper lobe, right bronchus or lung: Secondary | ICD-10-CM | POA: Diagnosis not present

## 2018-01-07 DIAGNOSIS — Z87891 Personal history of nicotine dependence: Secondary | ICD-10-CM

## 2018-01-07 DIAGNOSIS — C3491 Malignant neoplasm of unspecified part of right bronchus or lung: Secondary | ICD-10-CM

## 2018-01-07 LAB — COMPREHENSIVE METABOLIC PANEL
ALBUMIN: 3.8 g/dL (ref 3.5–5.0)
ALT: 14 U/L (ref 0–44)
AST: 28 U/L (ref 15–41)
Alkaline Phosphatase: 62 U/L (ref 38–126)
Anion gap: 11 (ref 5–15)
BUN: 10 mg/dL (ref 8–23)
CHLORIDE: 101 mmol/L (ref 98–111)
CO2: 25 mmol/L (ref 22–32)
CREATININE: 0.8 mg/dL (ref 0.61–1.24)
Calcium: 8.7 mg/dL — ABNORMAL LOW (ref 8.9–10.3)
GFR calc non Af Amer: >60 mL/min (ref >60)
GLUCOSE: 180 mg/dL — AB (ref 70–99)
Potassium: 3 mmol/L — ABNORMAL LOW (ref 3.5–5.1)
SODIUM: 137 mmol/L (ref 135–145)
Total Bilirubin: 0.6 mg/dL (ref 0.3–1.2)
Total Protein: 7.2 g/dL (ref 6.5–8.1)

## 2018-01-07 LAB — CBC WITH DIFFERENTIAL/PLATELET
Basophils Absolute: 0 10*3/uL (ref 0–0.1)
Basophils Relative: 1 %
Eosinophils Absolute: 0.1 10*3/uL (ref 0–0.7)
Eosinophils Relative: 1 %
HEMATOCRIT: 41.7 % (ref 40.0–52.0)
Hemoglobin: 14.6 g/dL (ref 13.0–18.0)
LYMPHS ABS: 1 10*3/uL (ref 1.0–3.6)
Lymphocytes Relative: 15 %
MCH: 32.5 pg (ref 26.0–34.0)
MCHC: 35.1 g/dL (ref 32.0–36.0)
MCV: 92.4 fL (ref 80.0–100.0)
MONOS PCT: 6 %
Monocytes Absolute: 0.4 10*3/uL (ref 0.2–1.0)
NEUTROS ABS: 5 10*3/uL (ref 1.4–6.5)
NEUTROS PCT: 77 %
Platelets: 269 10*3/uL (ref 150–440)
RBC: 4.51 MIL/uL (ref 4.40–5.90)
RDW: 14.6 % — ABNORMAL HIGH (ref 11.5–14.5)
WBC: 6.5 10*3/uL (ref 3.8–10.6)

## 2018-01-07 LAB — MAGNESIUM: Magnesium: 1.8 mg/dL (ref 1.7–2.4)

## 2018-01-07 MED ORDER — HEPARIN SOD (PORK) LOCK FLUSH 100 UNIT/ML IV SOLN
500.0000 [IU] | Freq: Once | INTRAVENOUS | Status: AC | PRN
  Administered 2018-01-07: 500 [IU]
  Filled 2018-01-07: qty 5

## 2018-01-07 MED ORDER — SODIUM CHLORIDE 0.9 % IV SOLN
Freq: Once | INTRAVENOUS | Status: AC
  Administered 2018-01-07: 09:00:00 via INTRAVENOUS
  Filled 2018-01-07: qty 1000

## 2018-01-07 MED ORDER — POTASSIUM CHLORIDE 20 MEQ/100ML IV SOLN
20.0000 meq | Freq: Once | INTRAVENOUS | Status: AC
  Administered 2018-01-07: 20 meq via INTRAVENOUS

## 2018-01-07 MED ORDER — POTASSIUM CHLORIDE CRYS ER 20 MEQ PO TBCR: 20.0000 meq | EXTENDED_RELEASE_TABLET | Freq: Every day | ORAL | 0 refills | Status: DC

## 2018-01-07 MED ORDER — SODIUM CHLORIDE 0.9 % IV SOLN
20.0000 mg | Freq: Once | INTRAVENOUS | Status: AC
  Administered 2018-01-07: 20 mg via INTRAVENOUS
  Filled 2018-01-07: qty 2

## 2018-01-07 MED ORDER — SODIUM CHLORIDE 0.9 % IV SOLN
45.0000 mg/m2 | Freq: Once | INTRAVENOUS | Status: AC
  Administered 2018-01-07: 96 mg via INTRAVENOUS
  Filled 2018-01-07: qty 16

## 2018-01-07 MED ORDER — FAMOTIDINE IN NACL 20-0.9 MG/50ML-% IV SOLN
20.0000 mg | Freq: Once | INTRAVENOUS | Status: AC
  Administered 2018-01-07: 20 mg via INTRAVENOUS
  Filled 2018-01-07: qty 50

## 2018-01-07 MED ORDER — DIPHENHYDRAMINE HCL 50 MG/ML IJ SOLN
50.0000 mg | Freq: Once | INTRAMUSCULAR | Status: AC
  Administered 2018-01-07: 50 mg via INTRAVENOUS
  Filled 2018-01-07: qty 1

## 2018-01-07 MED ORDER — SODIUM CHLORIDE 0.9 % IV SOLN
242.0000 mg | Freq: Once | INTRAVENOUS | Status: AC
  Administered 2018-01-07: 240 mg via INTRAVENOUS
  Filled 2018-01-07: qty 24

## 2018-01-07 MED ORDER — PALONOSETRON HCL INJECTION 0.25 MG/5ML
0.2500 mg | Freq: Once | INTRAVENOUS | Status: AC
  Administered 2018-01-07: 0.25 mg via INTRAVENOUS
  Filled 2018-01-07: qty 5

## 2018-01-07 NOTE — Progress Notes (Signed)
Hematology/Oncology Follow up note [facility] [phone] Fax:([phone]   Patient Care Team: [name], MD as PCP - General (Internal Medicine) [name], RN as Registered Nurse   REASON FOR VISIT Follow up for treatment of Stage III lung adenocarcinoma  Oncology History:  Mark Mccarty is a  67 y.o.  male with metastatic adenocarcinoma of lung, diagnosed via biopsy of right hilar lymph nodes.  Former 43 pack year smoking history.  T3 N1 M0  INTERVAL HISTORY Mark Mccarty is a 67 y.o. male who has above history reviewed by me today presents for assessment prior to concurrent chemotherapy with RT.  Patient is on concurrent RT.  Chronic SOB: at baseline.    Review of Systems  Constitutional: Negative for chills, fever, malaise/fatigue and weight loss.  [name]: Negative for congestion, ear discharge, ear pain, nosebleeds, sinus pain and sore throat.   Eyes: Negative for double vision, photophobia, pain, discharge and redness.  Respiratory: Positive for shortness of breath. Negative for cough, hemoptysis, sputum production and wheezing.   Cardiovascular: Negative for chest pain, palpitations, orthopnea, claudication and leg swelling.  Gastrointestinal: Negative for abdominal pain, blood in stool, constipation, diarrhea, heartburn, melena, nausea and vomiting.  Genitourinary: Negative for dysuria, flank pain, frequency and hematuria.  Musculoskeletal: Negative for back pain, myalgias and neck pain.  Skin: Negative for itching and rash.  Neurological: Negative for dizziness, tingling, tremors, focal weakness, weakness and headaches.  Endo/Heme/Allergies: Negative for environmental allergies. Does not bruise/bleed easily.  Psychiatric/Behavioral: Negative for depression and hallucinations. The patient is not nervous/anxious.     MEDICAL HISTORY:  Past Medical History:  Diagnosis Date  . Asthma   . Cancer ([place])    lung  .  Cataract    left cataract surgery  . Dyspnea   . [name]   . GERD (gastroesophageal reflux disease)   . Hypertension   . Neuromuscular disorder ([place])    pt has knees bilateral with tendon issues that cause pain    SURGICAL HISTORY: Past Surgical History:  Procedure Laterality Date  . CARDIAC CATHETERIZATION N/A 12/16/2014   Procedure: Left Heart Cath;  Surgeon: [name], MD;  Location: [place] [facility];  Service: Cardiovascular;  Laterality: N/A;  . CYST REMOVAL TRUNK     chest and back over time and it was removed  . ENDOBRONCHIAL ULTRASOUND N/A 12/09/2017   Procedure: ENDOBRONCHIAL ULTRASOUND;  Surgeon: [name], MD;  Location: [name];  Service: Pulmonary;  Laterality: N/A;  . left cataract surgery    . PORTA CATH INSERTION N/A 12/23/2017   Procedure: PORTA CATH INSERTION;  Surgeon: [name], MD;  Location: [place] [facility];  Service: Cardiovascular;  Laterality: N/A;    SOCIAL HISTORY: Social History   Socioeconomic History  . Marital status: Divorced    Spouse name: Not on file  . Number of children: Not on file  . Years of education: Not on file  . Highest education level: Not on file  Occupational History  . Not on file  Social Needs  . Financial resource strain: Not on file  . Food insecurity:    Worry: Not on file    Inability: Not on file  . Transportation needs:    Medical: Not on file    Non-medical: Not on file  Tobacco Use  . Smoking status: Former Smoker    Packs/day: 1.50    Years: 35.00    Pack years: 52.50    Types: Cigarettes  Last attempt to quit: 06/22/2015    Years since quitting: 2.5  . Smokeless tobacco: Never Used  Substance and Sexual Activity  . Alcohol use: Yes    Comment: a fifth in a week  . Drug use: No  . Sexual activity: Not Currently  Lifestyle  . Physical activity:    Days per week: Not on file    Minutes per session: Not on file  . Stress: Not on file  Relationships  . Social  connections:    Talks on phone: Not on file    Gets together: Not on file    Attends religious service: Not on file    Active member of club or organization: Not on file    Attends meetings of clubs or organizations: Not on file    Relationship status: Not on file  . Intimate partner violence:    Fear of current or ex partner: Not on file    Emotionally abused: Not on file    Physically abused: Not on file    Forced sexual activity: Not on file  Other Topics Concern  . Not on file  Social History Narrative  . Not on file    FAMILY HISTORY: Family History  Problem Relation Age of Onset  . Hypertension Mother   . Breast cancer Mother   . Heart attack Mother   . Lung cancer Father   . Heart disease Sister   . Diabetes Sister   . Colon cancer Sister     ALLERGIES:  is allergic to penicillins.  MEDICATIONS:  Current Outpatient Medications  Medication Sig Dispense Refill  . acetaminophen (TYLENOL) 500 MG tablet Take 500 mg by mouth as needed.    [name] albuterol (PROVENTIL HFA;VENTOLIN HFA) 108 (90 Base) MCG/ACT inhaler Inhale 2 puffs into the lungs every 6 (six) hours as needed.  12  . amLODipine (NORVASC) 10 MG tablet Take 10 mg by mouth daily.    Jearl Klinefelter ELLIPTA 62.5-25 MCG/INH AEPB Inhale 1 puff into the lungs daily. 60 each 6  . aspirin 81 MG tablet Take 81 mg by mouth daily.    . carvedilol (COREG) 25 MG tablet Take 25 mg by mouth 2 (two) times daily with a meal.    . doxycycline (VIBRAMYCIN) 100 MG capsule Take 100 mg by mouth 2 (two) times daily.    [name] gabapentin (NEURONTIN) 300 MG capsule Take 300 mg by mouth at bedtime.    [name] ibuprofen (ADVIL,MOTRIN) 600 MG tablet Take 600 mg by mouth every 6 (six) hours as needed.    . lidocaine-prilocaine (EMLA) cream Apply to affected area once 30 g 3  . lisinopril (PRINIVIL,ZESTRIL) 40 MG tablet Take 40 mg by mouth daily.    [name] omeprazole (PRILOSEC) 20 MG capsule Take 20 mg by mouth 2 (two) times daily before a meal.    . ondansetron  (ZOFRAN) 8 MG tablet Take 1 tablet (8 mg total) by mouth 2 (two) times daily as needed for refractory nausea / vomiting. 30 tablet 1  . potassium chloride (K-DUR,KLOR-CON) 10 MEQ tablet Take 1 tablet (10 mEq total) by mouth daily. 14 tablet 0  . prochlorperazine (COMPAZINE) 10 MG tablet Take 1 tablet (10 mg total) by mouth every 6 (six) hours as needed (Nausea or vomiting). 30 tablet 1  . sildenafil (VIAGRA) 100 MG tablet Take 100 mg by mouth as needed for erectile dysfunction.    . tamsulosin (FLOMAX) 0.4 MG CAPS capsule Take 0.4 mg by mouth daily.  No current facility-administered medications for this visit.      PHYSICAL EXAMINATION: ECOG PERFORMANCE STATUS: 0 - Asymptomatic Vitals:   01/07/18 0837 01/07/18 0838  BP: 131/73   Pulse: 72   Resp: 18   Temp: (!) 96.1 F (35.6 C)   SpO2:  95%   Filed Weights   01/07/18 0837  Weight: 208 lb 6.4 oz (94.5 kg)    Physical Exam  Constitutional: He is oriented to person, place, and time. He appears well-developed and well-nourished. No distress.  [name]:  Head: Normocephalic and atraumatic.  Right Ear: External ear normal.  Left Ear: External ear normal.  Mouth/Throat: Oropharynx is clear and moist.  Eyes: Pupils are equal, round, and reactive to light. Conjunctivae and EOM are normal. No scleral icterus.  Neck: Normal range of motion. Neck supple.  Cardiovascular: Normal rate, regular rhythm and normal heart sounds.  Pulmonary/Chest: Effort normal and breath sounds normal. No respiratory distress. He has no wheezes. He has no rales. He exhibits no tenderness.  Decreased breath sounds bilaterally  Abdominal: Soft. Bowel sounds are normal. He exhibits no distension and no mass. There is no tenderness.  Musculoskeletal: Normal range of motion. He exhibits no edema or deformity.  Lymphadenopathy:    He has no cervical adenopathy.  Neurological: He is alert and oriented to person, place, and time. No cranial nerve deficit. Coordination  normal.  Skin: Skin is warm and dry. No rash noted.  Psychiatric: He has a normal mood and affect. His behavior is normal. Thought content normal.     LABORATORY DATA:  I have reviewed the data as listed Lab Results  Component Value Date   WBC 6.5 01/07/2018   HGB 14.6 01/07/2018   HCT 41.7 01/07/2018   MCV 92.4 01/07/2018   PLT 269 01/07/2018   Recent Labs    12/06/17 1345 12/31/17 1034 01/07/18 0804  NA 138 138 137  K 3.1* 3.2* 3.0*  CL 101 101 101  CO2 25 25 25   GLUCOSE 101* 119* 180*  BUN 14 16 10   CREATININE 0.74 0.88 0.80  CALCIUM 9.1 9.2 8.7*  GFRNONAA >60 >60 >60  GFRAA >60 >60 >60  PROT 7.5 7.6 7.2  ALBUMIN 4.2 4.0 3.8  AST 18 20 28   ALT 15* 15 14  ALKPHOS 51 62 62  BILITOT 0.8 0.9 0.6       ASSESSMENT & PLAN:  1. Primary lung adenocarcinoma, right ([place])   2. Encounter for antineoplastic chemotherapy   3. Hypokalemia   T3 N1 M0 lung adenocarcinoma # Labs reviewed, counts are acceptable to proceed today's [place] and Taxol.   # Hypokalemia: will give IV KCL 69meq x 1. Increase daily potassium supplements to 17meq. Rx sent to pharmacy.  Check magnesium level. Orders Placed This Encounter  Procedures  . Magnesium    Standing Status:   Future    Number of Occurrences:   1    Standing Expiration Date:   01/07/2019   All questions were answered. The patient knows to call the clinic with any problems questions or concerns.  Return of visit: 1 week for assessment of next cycle of chemotherapy.  Total face to face encounter time for this patient visit was 25 min. >50% of the time was  spent in counseling and coordination of care.   [name], MD, PhD Hematology Oncology [facility] at [facility] Pager- [phone] 01/07/2018

## 2018-01-07 NOTE — Progress Notes (Signed)
Patient here for follow up. No concerns voiced.  °

## 2018-01-08 ENCOUNTER — Other Ambulatory Visit: Payer: Self-pay | Admitting: Internal Medicine

## 2018-01-08 ENCOUNTER — Ambulatory Visit
Admission: RE | Admit: 2018-01-08 | Discharge: 2018-01-08 | Disposition: A | Discharge status: Home / Self Care | Payer: Medicare Other | Source: Ambulatory Visit | Attending: Radiation Oncology | Admitting: Radiation Oncology

## 2018-01-08 DIAGNOSIS — C3411 Malignant neoplasm of upper lobe, right bronchus or lung: Secondary | ICD-10-CM | POA: Diagnosis not present

## 2018-01-09 ENCOUNTER — Ambulatory Visit
Admission: RE | Admit: 2018-01-09 | Discharge: 2018-01-09 | Disposition: A | Discharge status: Home / Self Care | Payer: Medicare Other | Source: Ambulatory Visit | Attending: Radiation Oncology | Admitting: Radiation Oncology

## 2018-01-09 DIAGNOSIS — C3411 Malignant neoplasm of upper lobe, right bronchus or lung: Secondary | ICD-10-CM | POA: Diagnosis not present

## 2018-01-10 ENCOUNTER — Ambulatory Visit
Admission: RE | Admit: 2018-01-10 | Discharge: 2018-01-10 | Disposition: A | Discharge status: Home / Self Care | Payer: Medicare Other | Source: Ambulatory Visit | Attending: Radiation Oncology | Admitting: Radiation Oncology

## 2018-01-10 DIAGNOSIS — C3411 Malignant neoplasm of upper lobe, right bronchus or lung: Secondary | ICD-10-CM | POA: Diagnosis not present

## 2018-01-13 ENCOUNTER — Ambulatory Visit
Admission: RE | Admit: 2018-01-13 | Discharge: 2018-01-13 | Disposition: A | Discharge status: Home / Self Care | Payer: Medicare Other | Source: Ambulatory Visit | Attending: Radiation Oncology | Admitting: Radiation Oncology

## 2018-01-13 DIAGNOSIS — C3411 Malignant neoplasm of upper lobe, right bronchus or lung: Secondary | ICD-10-CM | POA: Diagnosis not present

## 2018-01-14 ENCOUNTER — Inpatient Hospital Stay (HOSPITAL_BASED_OUTPATIENT_CLINIC_OR_DEPARTMENT_OTHER): Payer: Medicare Other | Admitting: Oncology

## 2018-01-14 ENCOUNTER — Inpatient Hospital Stay: Payer: Medicare Other

## 2018-01-14 ENCOUNTER — Other Ambulatory Visit: Payer: Self-pay

## 2018-01-14 ENCOUNTER — Encounter: Payer: Self-pay | Admitting: *Deleted

## 2018-01-14 ENCOUNTER — Encounter: Payer: Self-pay | Admitting: Oncology

## 2018-01-14 ENCOUNTER — Ambulatory Visit
Admission: RE | Admit: 2018-01-14 | Discharge: 2018-01-14 | Disposition: A | Discharge status: Home / Self Care | Payer: Medicare Other | Source: Ambulatory Visit | Attending: Radiation Oncology | Admitting: Radiation Oncology

## 2018-01-14 VITALS — BP 144/85 | HR 71 | Temp 97.5°F | Resp 18 | Wt 207.2 lb

## 2018-01-14 DIAGNOSIS — Z87891 Personal history of nicotine dependence: Secondary | ICD-10-CM

## 2018-01-14 DIAGNOSIS — C771 Secondary and unspecified malignant neoplasm of intrathoracic lymph nodes: Secondary | ICD-10-CM

## 2018-01-14 DIAGNOSIS — R0602 Shortness of breath: Secondary | ICD-10-CM | POA: Diagnosis not present

## 2018-01-14 DIAGNOSIS — C3411 Malignant neoplasm of upper lobe, right bronchus or lung: Secondary | ICD-10-CM | POA: Diagnosis not present

## 2018-01-14 DIAGNOSIS — G4701 Insomnia due to medical condition: Secondary | ICD-10-CM

## 2018-01-14 DIAGNOSIS — K5900 Constipation, unspecified: Secondary | ICD-10-CM | POA: Diagnosis not present

## 2018-01-14 DIAGNOSIS — C3491 Malignant neoplasm of unspecified part of right bronchus or lung: Secondary | ICD-10-CM

## 2018-01-14 DIAGNOSIS — Z5111 Encounter for antineoplastic chemotherapy: Secondary | ICD-10-CM

## 2018-01-14 LAB — COMPREHENSIVE METABOLIC PANEL
ALT: 18 U/L (ref 0–44)
AST: 21 U/L (ref 15–41)
Albumin: 3.9 g/dL (ref 3.5–5.0)
Alkaline Phosphatase: 58 U/L (ref 38–126)
Anion gap: 9 (ref 5–15)
BILIRUBIN TOTAL: 0.9 mg/dL (ref 0.3–1.2)
BUN: 12 mg/dL (ref 8–23)
CHLORIDE: 103 mmol/L (ref 98–111)
CO2: 27 mmol/L (ref 22–32)
CREATININE: 0.82 mg/dL (ref 0.61–1.24)
Calcium: 9.3 mg/dL (ref 8.9–10.3)
GFR calc Af Amer: >60 mL/min (ref >60)
Glucose, Bld: 125 mg/dL — ABNORMAL HIGH (ref 70–99)
Potassium: 3.6 mmol/L (ref 3.5–5.1)
Sodium: 139 mmol/L (ref 135–145)
TOTAL PROTEIN: 6.9 g/dL (ref 6.5–8.1)

## 2018-01-14 LAB — CBC WITH DIFFERENTIAL/PLATELET
Basophils Absolute: 0 10*3/uL (ref 0–0.1)
Basophils Relative: 1 %
EOS ABS: 0 10*3/uL (ref 0–0.7)
EOS PCT: 1 %
HCT: 40.4 % (ref 40.0–52.0)
Hemoglobin: 14.3 g/dL (ref 13.0–18.0)
LYMPHS ABS: 0.5 10*3/uL — AB (ref 1.0–3.6)
Lymphocytes Relative: 9 %
MCH: 32.5 pg (ref 26.0–34.0)
MCHC: 35.4 g/dL (ref 32.0–36.0)
MCV: 92 fL (ref 80.0–100.0)
Monocytes Absolute: 0.6 10*3/uL (ref 0.2–1.0)
Monocytes Relative: 9 %
Neutro Abs: 4.9 10*3/uL (ref 1.4–6.5)
Neutrophils Relative %: 80 %
PLATELETS: 228 10*3/uL (ref 150–440)
RBC: 4.39 MIL/uL — ABNORMAL LOW (ref 4.40–5.90)
RDW: 14.5 % (ref 11.5–14.5)
WBC: 6.1 10*3/uL (ref 3.8–10.6)

## 2018-01-14 MED ORDER — SODIUM CHLORIDE 0.9% FLUSH
10.0000 mL | INTRAVENOUS | Status: DC | PRN
  Administered 2018-01-14: 10 mL via INTRAVENOUS
  Filled 2018-01-14: qty 10

## 2018-01-14 MED ORDER — DIPHENHYDRAMINE HCL 50 MG/ML IJ SOLN
50.0000 mg | Freq: Once | INTRAMUSCULAR | Status: AC
  Administered 2018-01-14: 50 mg via INTRAVENOUS
  Filled 2018-01-14: qty 1

## 2018-01-14 MED ORDER — DOCUSATE SODIUM 100 MG PO CAPS: 100.0000 mg | ORAL_CAPSULE | Freq: Every day | ORAL | 3 refills | Status: DC | PRN

## 2018-01-14 MED ORDER — HEPARIN SOD (PORK) LOCK FLUSH 100 UNIT/ML IV SOLN
500.0000 [IU] | Freq: Once | INTRAVENOUS | Status: AC
  Administered 2018-01-14: 500 [IU] via INTRAVENOUS
  Filled 2018-01-14: qty 5

## 2018-01-14 MED ORDER — FAMOTIDINE IN NACL 20-0.9 MG/50ML-% IV SOLN
20.0000 mg | Freq: Once | INTRAVENOUS | Status: AC
  Administered 2018-01-14: 20 mg via INTRAVENOUS
  Filled 2018-01-14: qty 50

## 2018-01-14 MED ORDER — SODIUM CHLORIDE 0.9 % IV SOLN
Freq: Once | INTRAVENOUS | Status: AC
  Administered 2018-01-14: 09:00:00 via INTRAVENOUS
  Filled 2018-01-14: qty 1000

## 2018-01-14 MED ORDER — HEPARIN SOD (PORK) LOCK FLUSH 100 UNIT/ML IV SOLN: 500.0000 [IU] | Freq: Once | INTRAVENOUS | Status: DC | PRN

## 2018-01-14 MED ORDER — PALONOSETRON HCL INJECTION 0.25 MG/5ML
0.2500 mg | Freq: Once | INTRAVENOUS | Status: AC
  Administered 2018-01-14: 0.25 mg via INTRAVENOUS
  Filled 2018-01-14: qty 5

## 2018-01-14 MED ORDER — ZOLPIDEM TARTRATE 5 MG PO TABS: 5.0000 mg | ORAL_TABLET | Freq: Every evening | ORAL | 0 refills | Status: DC | PRN

## 2018-01-14 MED ORDER — SODIUM CHLORIDE 0.9 % IV SOLN
242.0000 mg | Freq: Once | INTRAVENOUS | Status: AC
  Administered 2018-01-14: 240 mg via INTRAVENOUS
  Filled 2018-01-14: qty 24

## 2018-01-14 MED ORDER — SODIUM CHLORIDE 0.9 % IV SOLN
45.0000 mg/m2 | Freq: Once | INTRAVENOUS | Status: AC
  Administered 2018-01-14: 96 mg via INTRAVENOUS
  Filled 2018-01-14: qty 16

## 2018-01-14 MED ORDER — SODIUM CHLORIDE 0.9 % IV SOLN
20.0000 mg | Freq: Once | INTRAVENOUS | Status: AC
  Administered 2018-01-14: 20 mg via INTRAVENOUS
  Filled 2018-01-14: qty 2

## 2018-01-14 NOTE — Progress Notes (Signed)
Patient here today for follow up.   

## 2018-01-14 NOTE — Progress Notes (Signed)
Hematology/Oncology Follow up note [facility] [phone] [phone]   Patient Care Team: [name], MD as PCP - General (Internal Medicine) [name], RN as Registered Nurse   REASON FOR VISIT Follow up for treatment of Stage III lung adenocarcinoma  Oncology History:  KNUT RONDINELLI is a  67 y.o.  male with metastatic adenocarcinoma of lung, diagnosed via biopsy of right hilar lymph nodes.  Former 43 pack year smoking history.  Presents for assessment prior to concurrent chemo T3 N1 M0  INTERVAL HISTORY NIKOLA MARONE is a 67 y.o. male who has above history reviewed by me therapy with radiation. Patient reports feeling well.   problems and complaints are listed as below. Patient is on concurrent RT.  Chronic SOB: at baseline.  Insomnia: new problem for him recently. Started after cancer diagnosis. Difficult stay asleep. No aggravating or alleviating factors. Not associated symptoms.  Constipation: new problem for him, started recently.  No aggravating or alleviating factors.   Review of Systems  Constitutional: Negative for chills, fever, malaise/fatigue and weight loss.  [name]: Negative for congestion, ear discharge, ear pain, nosebleeds, sinus pain and sore throat.   Eyes: Negative for double vision, photophobia, pain, discharge and redness.  Respiratory: Positive for shortness of breath. Negative for cough, hemoptysis, sputum production and wheezing.   Cardiovascular: Negative for chest pain, palpitations, orthopnea, claudication and leg swelling.  Gastrointestinal: Positive for constipation. Negative for abdominal pain, blood in stool, diarrhea, heartburn, melena, nausea and vomiting.  Genitourinary: Negative for dysuria, flank pain, frequency and hematuria.  Musculoskeletal: Negative for back pain, myalgias and neck pain.  Skin: Negative for itching and rash.  Neurological: Negative for dizziness, tingling, tremors,  focal weakness, weakness and headaches.  Endo/Heme/Allergies: Negative for environmental allergies. Does not bruise/bleed easily.  Psychiatric/Behavioral: Negative for depression and hallucinations. The patient has insomnia. The patient is not nervous/anxious.     MEDICAL HISTORY:  Past Medical History:  Diagnosis Date  . Asthma   . Cancer ([place])    lung  . Cataract    left cataract surgery  . Dyspnea   . [name]   . GERD (gastroesophageal reflux disease)   . Hypertension   . Neuromuscular disorder ([place])    pt has knees bilateral with tendon issues that cause pain    SURGICAL HISTORY: Past Surgical History:  Procedure Laterality Date  . CARDIAC CATHETERIZATION N/A 12/16/2014   Procedure: Left Heart Cath;  Surgeon: [name], MD;  Location: [place] [facility];  Service: Cardiovascular;  Laterality: N/A;  . CYST REMOVAL TRUNK     chest and back over time and it was removed  . ENDOBRONCHIAL ULTRASOUND N/A 12/09/2017   Procedure: ENDOBRONCHIAL ULTRASOUND;  Surgeon: [name], MD;  Location: [name];  Service: Pulmonary;  Laterality: N/A;  . left cataract surgery    . PORTA CATH INSERTION N/A 12/23/2017   Procedure: PORTA CATH INSERTION;  Surgeon: [name], MD;  Location: [place] [facility];  Service: Cardiovascular;  Laterality: N/A;    SOCIAL HISTORY: Social History   Socioeconomic History  . Marital status: Divorced    Spouse name: Not on file  . Number of children: Not on file  . Years of education: Not on file  . Highest education level: Not on file  Occupational History  . Not on file  Social Needs  . Financial resource strain: Not on file  . Food insecurity:    Worry: Not on file  Inability: Not on file  . Transportation needs:    Medical: Not on file    Non-medical: Not on file  Tobacco Use  . Smoking status: Former Smoker    Packs/day: 1.50    Years: 35.00    Pack years: 52.50    Types: Cigarettes    Last attempt to  quit: 06/22/2015    Years since quitting: 2.5  . Smokeless tobacco: Never Used  Substance and Sexual Activity  . Alcohol use: Yes    Comment: a fifth in a week  . Drug use: No  . Sexual activity: Not Currently  Lifestyle  . Physical activity:    Days per week: Not on file    Minutes per session: Not on file  . Stress: Not on file  Relationships  . Social connections:    Talks on phone: Not on file    Gets together: Not on file    Attends religious service: Not on file    Active member of club or organization: Not on file    Attends meetings of clubs or organizations: Not on file    Relationship status: Not on file  . Intimate partner violence:    Fear of current or ex partner: Not on file    Emotionally abused: Not on file    Physically abused: Not on file    Forced sexual activity: Not on file  Other Topics Concern  . Not on file  Social History Narrative  . Not on file    FAMILY HISTORY: Family History  Problem Relation Age of Onset  . Hypertension Mother   . Breast cancer Mother   . Heart attack Mother   . Lung cancer Father   . Heart disease Sister   . Diabetes Sister   . Colon cancer Sister     ALLERGIES:  is allergic to penicillins.  MEDICATIONS:  Current Outpatient Medications  Medication Sig Dispense Refill  . acetaminophen (TYLENOL) 500 MG tablet Take 500 mg by mouth as needed.    [name] albuterol (PROVENTIL HFA;VENTOLIN HFA) 108 (90 Base) MCG/ACT inhaler Inhale 2 puffs into the lungs every 6 (six) hours as needed.  12  . amLODipine (NORVASC) 10 MG tablet Take 10 mg by mouth daily.    Jearl Klinefelter ELLIPTA 62.5-25 MCG/INH AEPB TAKE 1 PUFF BY MOUTH EVERY DAY 180 each 3  . aspirin 81 MG tablet Take 81 mg by mouth daily.    . carvedilol (COREG) 25 MG tablet Take 25 mg by mouth 2 (two) times daily with a meal.    . doxycycline (VIBRAMYCIN) 100 MG capsule Take 100 mg by mouth 2 (two) times daily.    [name] gabapentin (NEURONTIN) 300 MG capsule Take 300 mg by mouth at  bedtime.    [name] ibuprofen (ADVIL,MOTRIN) 600 MG tablet Take 600 mg by mouth every 6 (six) hours as needed.    . lidocaine-prilocaine (EMLA) cream Apply to affected area once 30 g 3  . lisinopril (PRINIVIL,ZESTRIL) 40 MG tablet Take 40 mg by mouth daily.    [name] omeprazole (PRILOSEC) 20 MG capsule Take 20 mg by mouth 2 (two) times daily before a meal.    . ondansetron (ZOFRAN) 8 MG tablet Take 1 tablet (8 mg total) by mouth 2 (two) times daily as needed for refractory nausea / vomiting. 30 tablet 1  . potassium chloride SA (K-DUR,KLOR-CON) 20 MEQ tablet Take 1 tablet (20 mEq total) by mouth daily. 14 tablet 0  . prochlorperazine (COMPAZINE) 10 MG tablet  Take 1 tablet (10 mg total) by mouth every 6 (six) hours as needed (Nausea or vomiting). 30 tablet 1  . sildenafil (VIAGRA) 100 MG tablet Take 100 mg by mouth as needed for erectile dysfunction.    . tamsulosin (FLOMAX) 0.4 MG CAPS capsule Take 0.4 mg by mouth daily.    [name] docusate sodium (COLACE) 100 MG capsule Take 1 capsule (100 mg total) by mouth daily as needed for mild constipation. Do not take if you have loose stools 30 capsule 3  . zolpidem (AMBIEN) 5 MG tablet Take 1 tablet (5 mg total) by mouth at bedtime as needed for sleep. 30 tablet 0   No current facility-administered medications for this visit.    Facility-Administered Medications Ordered in Other Visits  Medication Dose Route Frequency Provider Last Rate Last Dose  . heparin lock flush 100 unit/mL  500 Units Intracatheter Once PRN [name], MD      . sodium chloride flush (NS) 0.9 % injection 10 mL  10 mL Intravenous PRN [name], MD   10 mL at 01/14/18 0822     PHYSICAL EXAMINATION: ECOG PERFORMANCE STATUS: 0 - Asymptomatic Vitals:   01/14/18 0845 01/14/18 0856  BP: (!) 144/85   Pulse: 71   Resp: 18   Temp: (!) 97.5 F (36.4 C)   SpO2:  95%   Filed Weights   01/14/18 0845  Weight: 207 lb 4 oz (94 kg)    Physical Exam  Constitutional: He is oriented to person, place,  and time. He appears well-developed and well-nourished. No distress.  [name]:  Head: Normocephalic and atraumatic.  Right Ear: External ear normal.  Left Ear: External ear normal.  Mouth/Throat: Oropharynx is clear and moist.  Eyes: Pupils are equal, round, and reactive to light. Conjunctivae and EOM are normal. No scleral icterus.  Neck: Normal range of motion. Neck supple.  Cardiovascular: Normal rate, regular rhythm and normal heart sounds.  Pulmonary/Chest: Effort normal and breath sounds normal. No respiratory distress. He has no wheezes. He has no rales. He exhibits no tenderness.  Decreased breath sounds bilaterally  Abdominal: Soft. Bowel sounds are normal. He exhibits no distension and no mass. There is no tenderness.  Musculoskeletal: Normal range of motion. He exhibits no edema or deformity.  Lymphadenopathy:    He has no cervical adenopathy.  Neurological: He is alert and oriented to person, place, and time. No cranial nerve deficit. Coordination normal.  Skin: Skin is warm and dry. No rash noted.  Psychiatric: He has a normal mood and affect. His behavior is normal. Thought content normal.     LABORATORY DATA:  I have reviewed the data as listed Lab Results  Component Value Date   WBC 6.1 01/14/2018   HGB 14.3 01/14/2018   HCT 40.4 01/14/2018   MCV 92.0 01/14/2018   PLT 228 01/14/2018   Recent Labs    12/31/17 1034 01/07/18 0804 01/14/18 0811  NA 138 137 139  K 3.2* 3.0* 3.6  CL 101 101 103  CO2 25 25 27   GLUCOSE 119* 180* 125*  BUN 16 10 12   CREATININE 0.88 0.80 0.82  CALCIUM 9.2 8.7* 9.3  GFRNONAA >60 >60 >60  GFRAA >60 >60 >60  PROT 7.6 7.2 6.9  ALBUMIN 4.0 3.8 3.9  AST 20 28 21   ALT 15 14 18   ALKPHOS 62 62 58  BILITOT 0.9 0.6 0.9       ASSESSMENT & PLAN:  1. Primary lung adenocarcinoma, right ([place])   2.  Encounter for antineoplastic chemotherapy   3. Insomnia due to medical condition   4. Constipation, unspecified constipation type   T3 N1  M0 lung adenocarcinoma # labs reviewed counts are acceptable to proceed today's [place] and Taxol.   # Hypokalemia, resolved.  # Constipation: start Colace 100mg  daily.  # Insomnia, start trial of Ambien. Rx sent pharmacy  All questions were answered. The patient knows to call the clinic with any problems questions or concerns.  Return of visit: 1 week for assessment of next cycle of chemotherapy.  Total face to face encounter time for this patient visit was 58min. >50% of the time was  spent in counseling and coordination of care.   [name], MD, PhD Hematology Oncology [facility] at [facility] Pager- [phone] 01/14/2018

## 2018-01-15 ENCOUNTER — Ambulatory Visit
Admission: RE | Admit: 2018-01-15 | Discharge: 2018-01-15 | Disposition: A | Discharge status: Home / Self Care | Payer: Medicare Other | Source: Ambulatory Visit | Attending: Radiation Oncology | Admitting: Radiation Oncology

## 2018-01-15 DIAGNOSIS — C3411 Malignant neoplasm of upper lobe, right bronchus or lung: Secondary | ICD-10-CM | POA: Diagnosis not present

## 2018-01-16 ENCOUNTER — Ambulatory Visit
Admission: RE | Admit: 2018-01-16 | Discharge: 2018-01-16 | Disposition: A | Discharge status: Home / Self Care | Payer: Medicare Other | Source: Ambulatory Visit | Attending: Radiation Oncology | Admitting: Radiation Oncology

## 2018-01-16 DIAGNOSIS — C3411 Malignant neoplasm of upper lobe, right bronchus or lung: Secondary | ICD-10-CM | POA: Diagnosis not present

## 2018-01-17 ENCOUNTER — Ambulatory Visit
Admission: RE | Admit: 2018-01-17 | Discharge: 2018-01-17 | Disposition: A | Discharge status: Home / Self Care | Payer: Medicare Other | Source: Ambulatory Visit | Attending: Radiation Oncology | Admitting: Radiation Oncology

## 2018-01-17 DIAGNOSIS — C3411 Malignant neoplasm of upper lobe, right bronchus or lung: Secondary | ICD-10-CM | POA: Diagnosis not present

## 2018-01-20 ENCOUNTER — Ambulatory Visit
Admission: RE | Admit: 2018-01-20 | Discharge: 2018-01-20 | Disposition: A | Discharge status: Home / Self Care | Payer: Medicare Other | Source: Ambulatory Visit | Attending: Radiation Oncology | Admitting: Radiation Oncology

## 2018-01-20 DIAGNOSIS — C3411 Malignant neoplasm of upper lobe, right bronchus or lung: Secondary | ICD-10-CM | POA: Diagnosis not present

## 2018-01-21 ENCOUNTER — Other Ambulatory Visit: Payer: Self-pay

## 2018-01-21 ENCOUNTER — Ambulatory Visit
Admission: RE | Admit: 2018-01-21 | Discharge: 2018-01-21 | Disposition: A | Discharge status: Home / Self Care | Payer: Medicare Other | Source: Ambulatory Visit | Attending: Radiation Oncology | Admitting: Radiation Oncology

## 2018-01-21 ENCOUNTER — Encounter: Payer: Self-pay | Admitting: Oncology

## 2018-01-21 ENCOUNTER — Inpatient Hospital Stay: Payer: Medicare Other

## 2018-01-21 ENCOUNTER — Inpatient Hospital Stay (HOSPITAL_BASED_OUTPATIENT_CLINIC_OR_DEPARTMENT_OTHER): Payer: Medicare Other | Admitting: Oncology

## 2018-01-21 VITALS — BP 154/85 | HR 76 | Temp 96.9°F | Resp 18 | Wt 211.9 lb

## 2018-01-21 DIAGNOSIS — Z87891 Personal history of nicotine dependence: Secondary | ICD-10-CM

## 2018-01-21 DIAGNOSIS — C3411 Malignant neoplasm of upper lobe, right bronchus or lung: Secondary | ICD-10-CM | POA: Diagnosis not present

## 2018-01-21 DIAGNOSIS — R0602 Shortness of breath: Secondary | ICD-10-CM | POA: Diagnosis not present

## 2018-01-21 DIAGNOSIS — Z5111 Encounter for antineoplastic chemotherapy: Secondary | ICD-10-CM

## 2018-01-21 DIAGNOSIS — C3491 Malignant neoplasm of unspecified part of right bronchus or lung: Secondary | ICD-10-CM | POA: Diagnosis not present

## 2018-01-21 DIAGNOSIS — K5900 Constipation, unspecified: Secondary | ICD-10-CM | POA: Diagnosis not present

## 2018-01-21 DIAGNOSIS — G4701 Insomnia due to medical condition: Secondary | ICD-10-CM | POA: Diagnosis not present

## 2018-01-21 LAB — CBC WITH DIFFERENTIAL/PLATELET
BASOS ABS: 0 10*3/uL (ref 0–0.1)
Basophils Relative: 1 %
EOS ABS: 0.1 10*3/uL (ref 0–0.7)
Eosinophils Relative: 1 %
HCT: 38.5 % — ABNORMAL LOW (ref 40.0–52.0)
Hemoglobin: 13.5 g/dL (ref 13.0–18.0)
LYMPHS PCT: 8 %
Lymphs Abs: 0.5 10*3/uL — ABNORMAL LOW (ref 1.0–3.6)
MCH: 32.6 pg (ref 26.0–34.0)
MCHC: 35.2 g/dL (ref 32.0–36.0)
MCV: 92.6 fL (ref 80.0–100.0)
Monocytes Absolute: 0.6 10*3/uL (ref 0.2–1.0)
Monocytes Relative: 10 %
Neutro Abs: 4.7 10*3/uL (ref 1.4–6.5)
Neutrophils Relative %: 80 %
PLATELETS: 214 10*3/uL (ref 150–440)
RBC: 4.16 MIL/uL — AB (ref 4.40–5.90)
RDW: 14.7 % — ABNORMAL HIGH (ref 11.5–14.5)
WBC: 5.9 10*3/uL (ref 3.8–10.6)

## 2018-01-21 LAB — COMPREHENSIVE METABOLIC PANEL
ALT: 18 U/L (ref 0–44)
ANION GAP: 10 (ref 5–15)
AST: 19 U/L (ref 15–41)
Albumin: 3.6 g/dL (ref 3.5–5.0)
Alkaline Phosphatase: 53 U/L (ref 38–126)
BILIRUBIN TOTAL: 0.5 mg/dL (ref 0.3–1.2)
BUN: 10 mg/dL (ref 8–23)
CO2: 26 mmol/L (ref 22–32)
Calcium: 8.8 mg/dL — ABNORMAL LOW (ref 8.9–10.3)
Chloride: 104 mmol/L (ref 98–111)
Creatinine, Ser: 0.83 mg/dL (ref 0.61–1.24)
Glucose, Bld: 123 mg/dL — ABNORMAL HIGH (ref 70–99)
POTASSIUM: 3.6 mmol/L (ref 3.5–5.1)
Sodium: 140 mmol/L (ref 135–145)
TOTAL PROTEIN: 6.7 g/dL (ref 6.5–8.1)

## 2018-01-21 LAB — ACID FAST CULTURE WITH REFLEXED SENSITIVITIES: ACID FAST CULTURE - AFSCU3: NEGATIVE

## 2018-01-21 LAB — ACID FAST CULTURE WITH REFLEXED SENSITIVITIES (MYCOBACTERIA)

## 2018-01-21 MED ORDER — SODIUM CHLORIDE 0.9% FLUSH
10.0000 mL | Freq: Once | INTRAVENOUS | Status: AC
  Administered 2018-01-21: 10 mL via INTRAVENOUS
  Filled 2018-01-21: qty 10

## 2018-01-21 MED ORDER — FAMOTIDINE IN NACL 20-0.9 MG/50ML-% IV SOLN
20.0000 mg | Freq: Once | INTRAVENOUS | Status: AC
  Administered 2018-01-21: 20 mg via INTRAVENOUS
  Filled 2018-01-21: qty 50

## 2018-01-21 MED ORDER — SODIUM CHLORIDE 0.9 % IV SOLN
242.0000 mg | Freq: Once | INTRAVENOUS | Status: AC
  Administered 2018-01-21: 240 mg via INTRAVENOUS
  Filled 2018-01-21: qty 24

## 2018-01-21 MED ORDER — SODIUM CHLORIDE 0.9 % IV SOLN
Freq: Once | INTRAVENOUS | Status: AC
  Administered 2018-01-21: 09:00:00 via INTRAVENOUS
  Filled 2018-01-21: qty 1000

## 2018-01-21 MED ORDER — SODIUM CHLORIDE 0.9 % IV SOLN
20.0000 mg | Freq: Once | INTRAVENOUS | Status: AC
  Administered 2018-01-21: 20 mg via INTRAVENOUS
  Filled 2018-01-21: qty 2

## 2018-01-21 MED ORDER — PALONOSETRON HCL INJECTION 0.25 MG/5ML
0.2500 mg | Freq: Once | INTRAVENOUS | Status: AC
  Administered 2018-01-21: 0.25 mg via INTRAVENOUS
  Filled 2018-01-21: qty 5

## 2018-01-21 MED ORDER — HEPARIN SOD (PORK) LOCK FLUSH 100 UNIT/ML IV SOLN
500.0000 [IU] | Freq: Once | INTRAVENOUS | Status: AC
  Administered 2018-01-21: 500 [IU] via INTRAVENOUS
  Filled 2018-01-21: qty 5

## 2018-01-21 MED ORDER — SODIUM CHLORIDE 0.9 % IV SOLN
45.0000 mg/m2 | Freq: Once | INTRAVENOUS | Status: AC
  Administered 2018-01-21: 96 mg via INTRAVENOUS
  Filled 2018-01-21: qty 16

## 2018-01-21 MED ORDER — DIPHENHYDRAMINE HCL 50 MG/ML IJ SOLN
50.0000 mg | Freq: Once | INTRAMUSCULAR | Status: AC
  Administered 2018-01-21: 50 mg via INTRAVENOUS
  Filled 2018-01-21: qty 1

## 2018-01-21 NOTE — Progress Notes (Signed)
Hematology/Oncology Follow up note [facility] Telephone:(336) [phone] Fax:(336) [phone]   Patient Care Team: [name], MD as PCP - General (Internal Medicine) [name], RN as Registered Nurse   REASON FOR VISIT Follow up for treatment of Stage III lung adenocarcinoma  Oncology History:  Mark Mccarty is a  67 y.o.  male with  adenocarcinoma of lung, diagnosed via biopsy of right hilar lymph nodes.  Former 43 pack year smoking history.  Presents for assessment prior to concurrent chemo T3 N1 M0  INTERVAL HISTORY Mark Mccarty is a 67 y.o. male who has above history reviewed by me present for assessment prior to chemotherapy. He is on concurrent chemoRT.   Chronic SOB: at baseline.  Insomnia: started on Ambien 5mg  QHS PRN and patient reports sleeping is better. .  Constipation: Started on Colace 100mg  daily PRN. Report constipation is better.   Appetite is good. He has gained weight. No cough, SOB more than his baseline.   Review of Systems  Constitutional: Negative for chills, fever, malaise/fatigue and weight loss.  [name]: Negative for congestion, ear discharge, ear pain, nosebleeds, sinus pain and sore throat.   Eyes: Negative for double vision, photophobia, pain, discharge and redness.  Respiratory: Positive for shortness of breath. Negative for cough, hemoptysis, sputum production and wheezing.   Cardiovascular: Negative for chest pain, palpitations, orthopnea, claudication and leg swelling.  Gastrointestinal: Negative for abdominal pain, blood in stool, constipation, diarrhea, heartburn, melena, nausea and vomiting.  Genitourinary: Negative for dysuria, flank pain, frequency and hematuria.  Musculoskeletal: Negative for back pain, myalgias and neck pain.  Skin: Negative for itching and rash.  Neurological: Negative for dizziness, tingling, tremors, focal weakness, weakness and headaches.  Endo/Heme/Allergies: Negative for environmental  allergies. Does not bruise/bleed easily.  Psychiatric/Behavioral: Negative for depression and hallucinations. The patient is not nervous/anxious and does not have insomnia.     MEDICAL HISTORY:  Past Medical History:  Diagnosis Date  . Asthma   . Cancer ([place])    lung  . Cataract    left cataract surgery  . Dyspnea   . [name]   . GERD (gastroesophageal reflux disease)   . Hypertension   . Neuromuscular disorder ([place])    pt has knees bilateral with tendon issues that cause pain    SURGICAL HISTORY: Past Surgical History:  Procedure Laterality Date  . CARDIAC CATHETERIZATION N/A 12/16/2014   Procedure: Left Heart Cath;  Surgeon: [name], MD;  Location: [place] [facility];  Service: Cardiovascular;  Laterality: N/A;  . CYST REMOVAL TRUNK     chest and back over time and it was removed  . ENDOBRONCHIAL ULTRASOUND N/A 12/09/2017   Procedure: ENDOBRONCHIAL ULTRASOUND;  Surgeon: [name], MD;  Location: [name];  Service: Pulmonary;  Laterality: N/A;  . left cataract surgery    . PORTA CATH INSERTION N/A 12/23/2017   Procedure: PORTA CATH INSERTION;  Surgeon: [name], MD;  Location: [place] [facility];  Service: Cardiovascular;  Laterality: N/A;    SOCIAL HISTORY: Social History   Socioeconomic History  . Marital status: Divorced    Spouse name: Not on file  . Number of children: Not on file  . Years of education: Not on file  . Highest education level: Not on file  Occupational History  . Not on file  Social Needs  . Financial resource strain: Not on file  . Food insecurity:    Worry: Not on file    Inability: Not on  file  . Transportation needs:    Medical: Not on file    Non-medical: Not on file  Tobacco Use  . Smoking status: Former Smoker    Packs/day: 1.50    Years: 35.00    Pack years: 52.50    Types: Cigarettes    Last attempt to quit: 06/22/2015    Years since quitting: 2.5  . Smokeless tobacco: Never Used  Substance  and Sexual Activity  . Alcohol use: Yes    Comment: a fifth in a week  . Drug use: No  . Sexual activity: Not Currently  Lifestyle  . Physical activity:    Days per week: Not on file    Minutes per session: Not on file  . Stress: Not on file  Relationships  . Social connections:    Talks on phone: Not on file    Gets together: Not on file    Attends religious service: Not on file    Active member of club or organization: Not on file    Attends meetings of clubs or organizations: Not on file    Relationship status: Not on file  . Intimate partner violence:    Fear of current or ex partner: Not on file    Emotionally abused: Not on file    Physically abused: Not on file    Forced sexual activity: Not on file  Other Topics Concern  . Not on file  Social History Narrative  . Not on file    FAMILY HISTORY: Family History  Problem Relation Age of Onset  . Hypertension Mother   . Breast cancer Mother   . Heart attack Mother   . Lung cancer Father   . Heart disease Sister   . Diabetes Sister   . Colon cancer Sister     ALLERGIES:  is allergic to penicillins.  MEDICATIONS:  Current Outpatient Medications  Medication Sig Dispense Refill  . acetaminophen (TYLENOL) 500 MG tablet Take 500 mg by mouth as needed.    [name] albuterol (PROVENTIL HFA;VENTOLIN HFA) 108 (90 Base) MCG/ACT inhaler Inhale 2 puffs into the lungs every 6 (six) hours as needed.  12  . amLODipine (NORVASC) 10 MG tablet Take 10 mg by mouth daily.    Jearl Klinefelter ELLIPTA 62.5-25 MCG/INH AEPB TAKE 1 PUFF BY MOUTH EVERY DAY 180 each 3  . aspirin 81 MG tablet Take 81 mg by mouth daily.    . carvedilol (COREG) 25 MG tablet Take 25 mg by mouth 2 (two) times daily with a meal.    . docusate sodium (COLACE) 100 MG capsule Take 1 capsule (100 mg total) by mouth daily as needed for mild constipation. Do not take if you have loose stools 30 capsule 3  . doxycycline (VIBRAMYCIN) 100 MG capsule Take 100 mg by mouth 2 (two) times  daily.    [name] gabapentin (NEURONTIN) 300 MG capsule Take 300 mg by mouth at bedtime.    [name] ibuprofen (ADVIL,MOTRIN) 600 MG tablet Take 600 mg by mouth every 6 (six) hours as needed.    . lidocaine-prilocaine (EMLA) cream Apply to affected area once 30 g 3  . lisinopril (PRINIVIL,ZESTRIL) 40 MG tablet Take 40 mg by mouth daily.    [name] omeprazole (PRILOSEC) 20 MG capsule Take 20 mg by mouth 2 (two) times daily before a meal.    . ondansetron (ZOFRAN) 8 MG tablet Take 1 tablet (8 mg total) by mouth 2 (two) times daily as needed for refractory nausea / vomiting. [place]  tablet 1  . potassium chloride SA (K-DUR,KLOR-CON) 20 MEQ tablet Take 1 tablet (20 mEq total) by mouth daily. 14 tablet 0  . prochlorperazine (COMPAZINE) 10 MG tablet Take 1 tablet (10 mg total) by mouth every 6 (six) hours as needed (Nausea or vomiting). 30 tablet 1  . sildenafil (VIAGRA) 100 MG tablet Take 100 mg by mouth as needed for erectile dysfunction.    . tamsulosin (FLOMAX) 0.4 MG CAPS capsule Take 0.4 mg by mouth daily.    [name] zolpidem (AMBIEN) 5 MG tablet Take 1 tablet (5 mg total) by mouth at bedtime as needed for sleep. 30 tablet 0   No current facility-administered medications for this visit.    Facility-Administered Medications Ordered in Other Visits  Medication Dose Route Frequency Provider Last Rate Last Dose  . heparin lock flush 100 unit/mL  500 Units Intravenous Once [name], MD         PHYSICAL EXAMINATION: ECOG PERFORMANCE STATUS: 0 - Asymptomatic There were no vitals filed for this visit. There were no vitals filed for this visit.  Physical Exam  Constitutional: He is oriented to person, place, and time. He appears well-developed and well-nourished. No distress.  [name]:  Head: Normocephalic and atraumatic.  Right Ear: External ear normal.  Left Ear: External ear normal.  Mouth/Throat: Oropharynx is clear and moist.  Eyes: Pupils are equal, round, and reactive to light. Conjunctivae and EOM are normal. No  scleral icterus.  Neck: Normal range of motion. Neck supple.  Cardiovascular: Normal rate, regular rhythm and normal heart sounds.  Pulmonary/Chest: Effort normal and breath sounds normal. No respiratory distress. He has no wheezes. He has no rales. He exhibits no tenderness.  Decreased breath sounds bilaterally  Abdominal: Soft. Bowel sounds are normal. He exhibits no distension and no mass. There is no tenderness.  Musculoskeletal: Normal range of motion. He exhibits no edema or deformity.  Lymphadenopathy:    He has no cervical adenopathy.  Neurological: He is alert and oriented to person, place, and time. No cranial nerve deficit. Coordination normal.  Skin: Skin is warm and dry. No rash noted.  Psychiatric: He has a normal mood and affect. His behavior is normal. Thought content normal.     LABORATORY DATA:  I have reviewed the data as listed Lab Results  Component Value Date   WBC 5.9 01/21/2018   HGB 13.5 01/21/2018   HCT 38.5 (L) 01/21/2018   MCV 92.6 01/21/2018   PLT 214 01/21/2018   Recent Labs    01/07/18 0804 01/14/18 0811 01/21/18 0757  NA 137 139 140  K 3.0* 3.6 3.6  CL 101 103 104  CO2 25 27 26   GLUCOSE 180* 125* 123*  BUN 10 12 10   CREATININE 0.80 0.82 0.83  CALCIUM 8.7* 9.3 8.8*  GFRNONAA >60 >60 >60  GFRAA >60 >60 >60  PROT 7.2 6.9 6.7  ALBUMIN 3.8 3.9 3.6  AST 28 21 19   ALT 14 18 18   ALKPHOS 62 58 53  BILITOT 0.6 0.9 0.5       ASSESSMENT & PLAN:  1. Primary lung adenocarcinoma, right ([place])   2. Encounter for antineoplastic chemotherapy   3. Constipation, unspecified constipation type   4. Insomnia due to medical condition   T3 N1 M0 lung adenocarcinoma #Labs reviewed and discussed with patient.  Acceptable to proceed with today's chemotherapy with [place] and Taxol.  # Constipation, improved.  continue Colace 100 mg daily. #Insomnia,improved continue Ambien as instructed. Patient asked about the plan  after he finished concurrent  chemotherapy and Radiation.  Discussed with him that after chemoRT, will wait about 3-4 weeks and he will have a repeat CT scan for assessment prior to starting immunotherapy maintenance.  All questions were answered. The patient knows to call the clinic with any problems questions or concerns.  Return of visit: 1 week for assessment for next cycle of chemotherapy.   Total face to face encounter time for this patient visit was 25 min. >50% of the time was  spent in counseling and coordination of care.    [name], MD, PhD Hematology Oncology [facility] at [facility] Pager- [phone] 01/21/2018

## 2018-01-21 NOTE — Progress Notes (Signed)
Patient here for follow up. No concerns voiced.  °

## 2018-01-22 ENCOUNTER — Ambulatory Visit
Admission: RE | Admit: 2018-01-22 | Discharge: 2018-01-22 | Disposition: A | Discharge status: Home / Self Care | Payer: Medicare Other | Source: Ambulatory Visit | Attending: Radiation Oncology | Admitting: Radiation Oncology

## 2018-01-22 DIAGNOSIS — C3411 Malignant neoplasm of upper lobe, right bronchus or lung: Secondary | ICD-10-CM | POA: Diagnosis not present

## 2018-01-23 ENCOUNTER — Ambulatory Visit
Admission: RE | Admit: 2018-01-23 | Discharge: 2018-01-23 | Disposition: A | Discharge status: Home / Self Care | Payer: Medicare Other | Source: Ambulatory Visit | Attending: Radiation Oncology | Admitting: Radiation Oncology

## 2018-01-23 DIAGNOSIS — C3411 Malignant neoplasm of upper lobe, right bronchus or lung: Secondary | ICD-10-CM | POA: Diagnosis not present

## 2018-01-24 ENCOUNTER — Ambulatory Visit
Admission: RE | Admit: 2018-01-24 | Discharge: 2018-01-24 | Disposition: A | Discharge status: Home / Self Care | Payer: Medicare Other | Source: Ambulatory Visit | Attending: Radiation Oncology | Admitting: Radiation Oncology

## 2018-01-24 DIAGNOSIS — C3411 Malignant neoplasm of upper lobe, right bronchus or lung: Secondary | ICD-10-CM | POA: Diagnosis not present

## 2018-01-27 ENCOUNTER — Ambulatory Visit
Admission: RE | Admit: 2018-01-27 | Discharge: 2018-01-27 | Disposition: A | Discharge status: Home / Self Care | Payer: Medicare Other | Source: Ambulatory Visit | Attending: Radiation Oncology | Admitting: Radiation Oncology

## 2018-01-27 DIAGNOSIS — C3411 Malignant neoplasm of upper lobe, right bronchus or lung: Secondary | ICD-10-CM | POA: Diagnosis not present

## 2018-01-28 ENCOUNTER — Inpatient Hospital Stay: Payer: Medicare Other

## 2018-01-28 ENCOUNTER — Ambulatory Visit
Admission: RE | Admit: 2018-01-28 | Discharge: 2018-01-28 | Disposition: A | Discharge status: Home / Self Care | Payer: Medicare Other | Source: Ambulatory Visit | Attending: Radiation Oncology | Admitting: Radiation Oncology

## 2018-01-28 ENCOUNTER — Other Ambulatory Visit: Payer: Self-pay

## 2018-01-28 ENCOUNTER — Encounter: Payer: Self-pay | Admitting: Oncology

## 2018-01-28 ENCOUNTER — Inpatient Hospital Stay (HOSPITAL_BASED_OUTPATIENT_CLINIC_OR_DEPARTMENT_OTHER): Payer: Medicare Other | Admitting: Oncology

## 2018-01-28 VITALS — BP 128/75 | Temp 96.2°F | Resp 18 | Wt 211.3 lb

## 2018-01-28 DIAGNOSIS — E876 Hypokalemia: Secondary | ICD-10-CM

## 2018-01-28 DIAGNOSIS — Z5111 Encounter for antineoplastic chemotherapy: Secondary | ICD-10-CM

## 2018-01-28 DIAGNOSIS — K5900 Constipation, unspecified: Secondary | ICD-10-CM

## 2018-01-28 DIAGNOSIS — G4701 Insomnia due to medical condition: Secondary | ICD-10-CM

## 2018-01-28 DIAGNOSIS — R0602 Shortness of breath: Secondary | ICD-10-CM

## 2018-01-28 DIAGNOSIS — C3491 Malignant neoplasm of unspecified part of right bronchus or lung: Secondary | ICD-10-CM

## 2018-01-28 DIAGNOSIS — C3411 Malignant neoplasm of upper lobe, right bronchus or lung: Secondary | ICD-10-CM | POA: Diagnosis not present

## 2018-01-28 DIAGNOSIS — Z87891 Personal history of nicotine dependence: Secondary | ICD-10-CM

## 2018-01-28 LAB — COMPREHENSIVE METABOLIC PANEL
ALT: 20 U/L (ref 0–44)
ANION GAP: 9 (ref 5–15)
AST: 20 U/L (ref 15–41)
Albumin: 3.7 g/dL (ref 3.5–5.0)
Alkaline Phosphatase: 55 U/L (ref 38–126)
BILIRUBIN TOTAL: 0.5 mg/dL (ref 0.3–1.2)
BUN: 14 mg/dL (ref 8–23)
CO2: 26 mmol/L (ref 22–32)
Calcium: 9 mg/dL (ref 8.9–10.3)
Chloride: 103 mmol/L (ref 98–111)
Creatinine, Ser: 0.86 mg/dL (ref 0.61–1.24)
GFR calc Af Amer: >60 mL/min (ref >60)
GFR calc non Af Amer: >60 mL/min (ref >60)
Glucose, Bld: 114 mg/dL — ABNORMAL HIGH (ref 70–99)
POTASSIUM: 3.4 mmol/L — AB (ref 3.5–5.1)
Sodium: 138 mmol/L (ref 135–145)
TOTAL PROTEIN: 6.9 g/dL (ref 6.5–8.1)

## 2018-01-28 LAB — CBC WITH DIFFERENTIAL/PLATELET
Basophils Absolute: 0 10*3/uL (ref 0–0.1)
Basophils Relative: 1 %
EOS PCT: 1 %
Eosinophils Absolute: 0 10*3/uL (ref 0–0.7)
HCT: 38.7 % — ABNORMAL LOW (ref 40.0–52.0)
Hemoglobin: 13.6 g/dL (ref 13.0–18.0)
LYMPHS PCT: 8 %
Lymphs Abs: 0.5 10*3/uL — ABNORMAL LOW (ref 1.0–3.6)
MCH: 32.6 pg (ref 26.0–34.0)
MCHC: 35.1 g/dL (ref 32.0–36.0)
MCV: 93 fL (ref 80.0–100.0)
Monocytes Absolute: 0.7 10*3/uL (ref 0.2–1.0)
Monocytes Relative: 11 %
Neutro Abs: 4.7 10*3/uL (ref 1.4–6.5)
Neutrophils Relative %: 79 %
PLATELETS: 159 10*3/uL (ref 150–440)
RBC: 4.16 MIL/uL — AB (ref 4.40–5.90)
RDW: 14.5 % (ref 11.5–14.5)
WBC: 6 10*3/uL (ref 3.8–10.6)

## 2018-01-28 MED ORDER — SODIUM CHLORIDE 0.9% FLUSH
10.0000 mL | INTRAVENOUS | Status: DC | PRN
  Administered 2018-01-28: 10 mL
  Filled 2018-01-28: qty 10

## 2018-01-28 MED ORDER — DIPHENHYDRAMINE HCL 50 MG/ML IJ SOLN
50.0000 mg | Freq: Once | INTRAMUSCULAR | Status: AC
  Administered 2018-01-28: 50 mg via INTRAVENOUS
  Filled 2018-01-28: qty 1

## 2018-01-28 MED ORDER — POTASSIUM CHLORIDE CRYS ER 20 MEQ PO TBCR: 20.0000 meq | EXTENDED_RELEASE_TABLET | Freq: Every day | ORAL | 1 refills | Status: DC

## 2018-01-28 MED ORDER — FAMOTIDINE IN NACL 20-0.9 MG/50ML-% IV SOLN
20.0000 mg | Freq: Once | INTRAVENOUS | Status: AC
  Administered 2018-01-28: 20 mg via INTRAVENOUS
  Filled 2018-01-28: qty 50

## 2018-01-28 MED ORDER — SODIUM CHLORIDE 0.9 % IV SOLN
45.0000 mg/m2 | Freq: Once | INTRAVENOUS | Status: AC
  Administered 2018-01-28: 96 mg via INTRAVENOUS
  Filled 2018-01-28: qty 16

## 2018-01-28 MED ORDER — SODIUM CHLORIDE 0.9 % IV SOLN
Freq: Once | INTRAVENOUS | Status: AC
  Administered 2018-01-28: 09:00:00 via INTRAVENOUS
  Filled 2018-01-28: qty 1000

## 2018-01-28 MED ORDER — SODIUM CHLORIDE 0.9 % IV SOLN
242.0000 mg | Freq: Once | INTRAVENOUS | Status: AC
  Administered 2018-01-28: 240 mg via INTRAVENOUS
  Filled 2018-01-28: qty 24

## 2018-01-28 MED ORDER — SODIUM CHLORIDE 0.9 % IV SOLN
20.0000 mg | Freq: Once | INTRAVENOUS | Status: AC
  Administered 2018-01-28: 20 mg via INTRAVENOUS
  Filled 2018-01-28: qty 2

## 2018-01-28 MED ORDER — PALONOSETRON HCL INJECTION 0.25 MG/5ML
0.2500 mg | Freq: Once | INTRAVENOUS | Status: AC
  Administered 2018-01-28: 0.25 mg via INTRAVENOUS
  Filled 2018-01-28: qty 5

## 2018-01-28 MED ORDER — HEPARIN SOD (PORK) LOCK FLUSH 100 UNIT/ML IV SOLN
500.0000 [IU] | Freq: Once | INTRAVENOUS | Status: AC | PRN
  Administered 2018-01-28: 500 [IU]
  Filled 2018-01-28: qty 5

## 2018-01-28 NOTE — Progress Notes (Signed)
Patient here for follow up. States he ran out of potassium last week and is wondering if he needs a refill.

## 2018-01-28 NOTE — Progress Notes (Signed)
Hematology/Oncology Follow up note [facility] Telephone:(336) [phone] [phone]   Patient Care Team: [name], MD as PCP - General (Internal Medicine) [name], RN as Registered Nurse   REASON FOR VISIT Follow up for treatment of Stage III lung adenocarcinoma  Oncology History:  Mark Mccarty is a  67 y.o.  male with  adenocarcinoma of lung, diagnosed via biopsy of right hilar lymph nodes.  Former 43 pack year smoking history.  Presents for assessment prior to concurrent chemo T3 N1 M0  INTERVAL HISTORY Mark Mccarty is a 67 y.o. male who has above history reviewed by me presents for assessment prior to chemotherapy carbo and Taxol for concurrent chemoradiation. Patient continued to do well at baseline. Chronic SOB: Stable.  At baseline..  Insomnia: He takes Ambien 5 mg nightly as needed insomnia and reports sleeping is better.   Constipation improved after started on Colace 100 mg daily. Denies any cough, chest pain, hemoptysis.  Appetite is good.  Weight is stable.  Review of Systems  Constitutional: Negative for chills, fever, malaise/fatigue and weight loss.  [name]: Negative for congestion, ear discharge, ear pain, nosebleeds, sinus pain and sore throat.   Eyes: Negative for double vision, photophobia, pain, discharge and redness.  Respiratory: Positive for shortness of breath. Negative for cough, hemoptysis, sputum production and wheezing.   Cardiovascular: Negative for chest pain, palpitations, orthopnea, claudication and leg swelling.  Gastrointestinal: Negative for abdominal pain, blood in stool, constipation, diarrhea, heartburn, melena, nausea and vomiting.  Genitourinary: Negative for dysuria, flank pain, frequency and hematuria.  Musculoskeletal: Negative for back pain, myalgias and neck pain.  Skin: Negative for itching and rash.  Neurological: Negative for dizziness, tingling, tremors, focal weakness, weakness and  headaches.  Endo/Heme/Allergies: Negative for environmental allergies. Does not bruise/bleed easily.  Psychiatric/Behavioral: Negative for depression and hallucinations. The patient is not nervous/anxious and does not have insomnia.     MEDICAL HISTORY:  Past Medical History:  Diagnosis Date  . Asthma   . Cancer ([place])    lung  . Cataract    left cataract surgery  . Dyspnea   . [name]   . GERD (gastroesophageal reflux disease)   . Hypertension   . Neuromuscular disorder ([place])    pt has knees bilateral with tendon issues that cause pain    SURGICAL HISTORY: Past Surgical History:  Procedure Laterality Date  . CARDIAC CATHETERIZATION N/A 12/16/2014   Procedure: Left Heart Cath;  Surgeon: [name], MD;  Location: [place] [facility];  Service: Cardiovascular;  Laterality: N/A;  . CYST REMOVAL TRUNK     chest and back over time and it was removed  . ENDOBRONCHIAL ULTRASOUND N/A 12/09/2017   Procedure: ENDOBRONCHIAL ULTRASOUND;  Surgeon: [name], MD;  Location: [name];  Service: Pulmonary;  Laterality: N/A;  . left cataract surgery    . PORTA CATH INSERTION N/A 12/23/2017   Procedure: PORTA CATH INSERTION;  Surgeon: [name], MD;  Location: [place] [facility];  Service: Cardiovascular;  Laterality: N/A;    SOCIAL HISTORY: Social History   Socioeconomic History  . Marital status: Divorced    Spouse name: Not on file  . Number of children: Not on file  . Years of education: Not on file  . Highest education level: Not on file  Occupational History  . Not on file  Social Needs  . Financial resource strain: Not on file  . Food insecurity:    Worry: Not on file  Inability: Not on file  . Transportation needs:    Medical: Not on file    Non-medical: Not on file  Tobacco Use  . Smoking status: Former Smoker    Packs/day: 1.50    Years: 35.00    Pack years: 52.50    Types: Cigarettes    Last attempt to quit: 06/22/2015    Years since  quitting: 2.6  . Smokeless tobacco: Never Used  Substance and Sexual Activity  . Alcohol use: Yes    Comment: a fifth in a week  . Drug use: No  . Sexual activity: Not Currently  Lifestyle  . Physical activity:    Days per week: Not on file    Minutes per session: Not on file  . Stress: Not on file  Relationships  . Social connections:    Talks on phone: Not on file    Gets together: Not on file    Attends religious service: Not on file    Active member of club or organization: Not on file    Attends meetings of clubs or organizations: Not on file    Relationship status: Not on file  . Intimate partner violence:    Fear of current or ex partner: Not on file    Emotionally abused: Not on file    Physically abused: Not on file    Forced sexual activity: Not on file  Other Topics Concern  . Not on file  Social History Narrative  . Not on file    FAMILY HISTORY: Family History  Problem Relation Age of Onset  . Hypertension Mother   . Breast cancer Mother   . Heart attack Mother   . Lung cancer Father   . Heart disease Sister   . Diabetes Sister   . Colon cancer Sister     ALLERGIES:  is allergic to penicillins.  MEDICATIONS:  Current Outpatient Medications  Medication Sig Dispense Refill  . acetaminophen (TYLENOL) 500 MG tablet Take 500 mg by mouth as needed.    [name] albuterol (PROVENTIL HFA;VENTOLIN HFA) 108 (90 Base) MCG/ACT inhaler Inhale 2 puffs into the lungs every 6 (six) hours as needed.  12  . amLODipine (NORVASC) 10 MG tablet Take 10 mg by mouth daily.    Jearl Klinefelter ELLIPTA 62.5-25 MCG/INH AEPB TAKE 1 PUFF BY MOUTH EVERY DAY 180 each 3  . aspirin 81 MG tablet Take 81 mg by mouth daily.    . carvedilol (COREG) 25 MG tablet Take 25 mg by mouth 2 (two) times daily with a meal.    . docusate sodium (COLACE) 100 MG capsule Take 1 capsule (100 mg total) by mouth daily as needed for mild constipation. Do not take if you have loose stools 30 capsule 3  . gabapentin  (NEURONTIN) 300 MG capsule Take 300 mg by mouth at bedtime as needed.     [name] ibuprofen (ADVIL,MOTRIN) 600 MG tablet Take 600 mg by mouth every 6 (six) hours as needed.    . lidocaine-prilocaine (EMLA) cream Apply to affected area once 30 g 3  . lisinopril (PRINIVIL,ZESTRIL) 40 MG tablet Take 40 mg by mouth daily.    [name] omeprazole (PRILOSEC) 20 MG capsule Take 20 mg by mouth 2 (two) times daily before a meal.    . ondansetron (ZOFRAN) 8 MG tablet Take 1 tablet (8 mg total) by mouth 2 (two) times daily as needed for refractory nausea / vomiting. 30 tablet 1  . prochlorperazine (COMPAZINE) 10 MG tablet Take 1 tablet (  10 mg total) by mouth every 6 (six) hours as needed (Nausea or vomiting). 30 tablet 1  . sildenafil (VIAGRA) 100 MG tablet Take 100 mg by mouth as needed for erectile dysfunction.    . tamsulosin (FLOMAX) 0.4 MG CAPS capsule Take 0.4 mg by mouth daily.    [name] zolpidem (AMBIEN) 5 MG tablet Take 1 tablet (5 mg total) by mouth at bedtime as needed for sleep. 30 tablet 0  . doxycycline (VIBRAMYCIN) 100 MG capsule Take 100 mg by mouth 2 (two) times daily.    . potassium chloride SA (K-DUR,KLOR-CON) 20 MEQ tablet Take 1 tablet (20 mEq total) by mouth daily. 30 tablet 1   No current facility-administered medications for this visit.      PHYSICAL EXAMINATION: ECOG PERFORMANCE STATUS: 0 - Asymptomatic Vitals:   01/28/18 0836  BP: 128/75  Resp: 18  Temp: (!) 96.2 F (35.7 C)  SpO2: 93%   Filed Weights   01/28/18 0836  Weight: 211 lb 4.8 oz (95.8 kg)    Physical Exam  Constitutional: He is oriented to person, place, and time. He appears well-developed and well-nourished. No distress.  [name]:  Head: Normocephalic and atraumatic.  Right Ear: External ear normal.  Left Ear: External ear normal.  Mouth/Throat: Oropharynx is clear and moist.  Eyes: Pupils are equal, round, and reactive to light. Conjunctivae and EOM are normal. No scleral icterus.  Neck: Normal range of motion. Neck  supple.  Cardiovascular: Normal rate, regular rhythm and normal heart sounds.  Pulmonary/Chest: Effort normal. No respiratory distress. He has no wheezes. He has no rales. He exhibits no tenderness.  Decreased breath sounds bilaterally  Abdominal: Soft. Bowel sounds are normal. He exhibits no distension and no mass. There is no tenderness.  Musculoskeletal: Normal range of motion. He exhibits no edema or deformity.  Lymphadenopathy:    He has no cervical adenopathy.  Neurological: He is alert and oriented to person, place, and time. No cranial nerve deficit. Coordination normal.  Skin: Skin is warm and dry. No rash noted. No erythema.  Psychiatric: He has a normal mood and affect. His behavior is normal. Thought content normal.     LABORATORY DATA:  I have reviewed the data as listed Lab Results  Component Value Date   WBC 6.0 01/28/2018   HGB 13.6 01/28/2018   HCT 38.7 (L) 01/28/2018   MCV 93.0 01/28/2018   PLT 159 01/28/2018   Recent Labs    01/14/18 0811 01/21/18 0757 01/28/18 0801  NA 139 140 138  K 3.6 3.6 3.4*  CL 103 104 103  CO2 27 26 26   GLUCOSE 125* 123* 114*  BUN 12 10 14   CREATININE 0.82 0.83 0.86  CALCIUM 9.3 8.8* 9.0  GFRNONAA >60 >60 >60  GFRAA >60 >60 >60  PROT 6.9 6.7 6.9  ALBUMIN 3.9 3.6 3.7  AST 21 19 20   ALT 18 18 20   ALKPHOS 58 53 55  BILITOT 0.9 0.5 0.5       ASSESSMENT & PLAN:  1. Primary lung adenocarcinoma, right ([place])   2. Encounter for antineoplastic chemotherapy   3. Insomnia due to medical condition   4. Hypokalemia   5. Constipation, unspecified constipation type   T3 N1 M0 lung adenocarcinoma #Labs reviewed and discussed with patient.  Counts are acceptable to proceed with today's chemotherapy with [place] and Taxol. Patient had questions about subsequent treatment after he finished chemo and radiation. I had a lengthy discussion with patient and explained to him that  after he complete chemoradiation, he will have 3 to 4 weeks of  break, and after that plan to start maintenance immunotherapy with durvalumab.  Prior to the immunotherapy, plan to also obtain CT scan for baseline. We discussed about rationale and side effects of immunotherapy.  Patient appreciate explanation.  We can discuss further prior to starting immunotherapy.  He voices understanding and is willing to proceed with plan.  # Constipation, improved.  Continue Colace 100 mg daily #Insomnia, continue Ambien as instructed. Patient asked about the plan after he finished concurrent chemotherapy and Radiation.  #Hypokalemia, potassium 3.4.  Continue K. Dur 20 mEq daily.  I have reviewed patient's prescription is sent to pharmacy. All questions were answered. The patient knows to call the clinic with any problems questions or concerns.  Return of visit: 1 week for assessment for next cycle of chemotherapy. Total face to face encounter time for this patient visit was 25 min. >50% of the time was  spent in counseling and coordination of care.    [name], MD, PhD Hematology Oncology [facility] at [facility] Pager- [phone] 01/28/2018

## 2018-01-29 ENCOUNTER — Ambulatory Visit
Admission: RE | Admit: 2018-01-29 | Discharge: 2018-01-29 | Disposition: A | Discharge status: Home / Self Care | Payer: Medicare Other | Source: Ambulatory Visit | Attending: Radiation Oncology | Admitting: Radiation Oncology

## 2018-01-29 DIAGNOSIS — C3411 Malignant neoplasm of upper lobe, right bronchus or lung: Secondary | ICD-10-CM | POA: Diagnosis not present

## 2018-01-30 ENCOUNTER — Ambulatory Visit
Admission: RE | Admit: 2018-01-30 | Discharge: 2018-01-30 | Disposition: A | Discharge status: Home / Self Care | Payer: Medicare Other | Source: Ambulatory Visit | Attending: Radiation Oncology | Admitting: Radiation Oncology

## 2018-01-30 DIAGNOSIS — Z510 Encounter for antineoplastic radiation therapy: Secondary | ICD-10-CM | POA: Diagnosis not present

## 2018-01-30 DIAGNOSIS — C3411 Malignant neoplasm of upper lobe, right bronchus or lung: Secondary | ICD-10-CM | POA: Diagnosis present

## 2018-01-31 ENCOUNTER — Ambulatory Visit
Admission: RE | Admit: 2018-01-31 | Discharge: 2018-01-31 | Disposition: A | Discharge status: Home / Self Care | Payer: Medicare Other | Source: Ambulatory Visit | Attending: Radiation Oncology | Admitting: Radiation Oncology

## 2018-01-31 DIAGNOSIS — C3411 Malignant neoplasm of upper lobe, right bronchus or lung: Secondary | ICD-10-CM | POA: Diagnosis not present

## 2018-02-03 ENCOUNTER — Ambulatory Visit
Admission: RE | Admit: 2018-02-03 | Discharge: 2018-02-03 | Disposition: A | Discharge status: Home / Self Care | Payer: Medicare Other | Source: Ambulatory Visit | Attending: Radiation Oncology | Admitting: Radiation Oncology

## 2018-02-03 DIAGNOSIS — C3411 Malignant neoplasm of upper lobe, right bronchus or lung: Secondary | ICD-10-CM | POA: Diagnosis not present

## 2018-02-04 ENCOUNTER — Ambulatory Visit
Admission: RE | Admit: 2018-02-04 | Discharge: 2018-02-04 | Disposition: A | Discharge status: Home / Self Care | Payer: Medicare Other | Source: Ambulatory Visit | Attending: Radiation Oncology | Admitting: Radiation Oncology

## 2018-02-04 ENCOUNTER — Inpatient Hospital Stay: Payer: Medicare Other

## 2018-02-04 ENCOUNTER — Encounter: Payer: Self-pay | Admitting: Oncology

## 2018-02-04 ENCOUNTER — Inpatient Hospital Stay: Payer: Medicare Other | Attending: Hematology and Oncology | Admitting: Oncology

## 2018-02-04 ENCOUNTER — Other Ambulatory Visit: Payer: Self-pay

## 2018-02-04 VITALS — BP 137/74 | HR 77 | Temp 96.3°F | Resp 18 | Wt 212.3 lb

## 2018-02-04 DIAGNOSIS — Z5111 Encounter for antineoplastic chemotherapy: Secondary | ICD-10-CM | POA: Insufficient documentation

## 2018-02-04 DIAGNOSIS — D701 Agranulocytosis secondary to cancer chemotherapy: Secondary | ICD-10-CM | POA: Insufficient documentation

## 2018-02-04 DIAGNOSIS — K5900 Constipation, unspecified: Secondary | ICD-10-CM | POA: Diagnosis not present

## 2018-02-04 DIAGNOSIS — C3491 Malignant neoplasm of unspecified part of right bronchus or lung: Secondary | ICD-10-CM | POA: Diagnosis not present

## 2018-02-04 DIAGNOSIS — R05 Cough: Secondary | ICD-10-CM | POA: Insufficient documentation

## 2018-02-04 DIAGNOSIS — Z87891 Personal history of nicotine dependence: Secondary | ICD-10-CM | POA: Diagnosis not present

## 2018-02-04 DIAGNOSIS — E876 Hypokalemia: Secondary | ICD-10-CM | POA: Diagnosis not present

## 2018-02-04 DIAGNOSIS — R0602 Shortness of breath: Secondary | ICD-10-CM | POA: Insufficient documentation

## 2018-02-04 DIAGNOSIS — R51 Headache: Secondary | ICD-10-CM | POA: Diagnosis not present

## 2018-02-04 DIAGNOSIS — G4700 Insomnia, unspecified: Secondary | ICD-10-CM | POA: Diagnosis not present

## 2018-02-04 DIAGNOSIS — C3411 Malignant neoplasm of upper lobe, right bronchus or lung: Secondary | ICD-10-CM | POA: Diagnosis not present

## 2018-02-04 LAB — CBC WITH DIFFERENTIAL/PLATELET
BASOS PCT: 1 %
Basophils Absolute: 0 10*3/uL (ref 0–0.1)
EOS ABS: 0 10*3/uL (ref 0–0.7)
EOS PCT: 1 %
HCT: 38.6 % — ABNORMAL LOW (ref 40.0–52.0)
HEMOGLOBIN: 13.5 g/dL (ref 13.0–18.0)
LYMPHS ABS: 0.4 10*3/uL — AB (ref 1.0–3.6)
Lymphocytes Relative: 10 %
MCH: 32.7 pg (ref 26.0–34.0)
MCHC: 35.1 g/dL (ref 32.0–36.0)
MCV: 93.2 fL (ref 80.0–100.0)
MONOS PCT: 10 %
Monocytes Absolute: 0.4 10*3/uL (ref 0.2–1.0)
NEUTROS PCT: 78 %
Neutro Abs: 3.3 10*3/uL (ref 1.4–6.5)
PLATELETS: 190 10*3/uL (ref 150–440)
RBC: 4.14 MIL/uL — AB (ref 4.40–5.90)
RDW: 15.4 % — ABNORMAL HIGH (ref 11.5–14.5)
WBC: 4.2 10*3/uL (ref 3.8–10.6)

## 2018-02-04 LAB — COMPREHENSIVE METABOLIC PANEL
ALK PHOS: 56 U/L (ref 38–126)
ALT: 20 U/L (ref 0–44)
ANION GAP: 12 (ref 5–15)
AST: 23 U/L (ref 15–41)
Albumin: 3.6 g/dL (ref 3.5–5.0)
BUN: 13 mg/dL (ref 8–23)
CO2: 24 mmol/L (ref 22–32)
Calcium: 8.9 mg/dL (ref 8.9–10.3)
Chloride: 102 mmol/L (ref 98–111)
Creatinine, Ser: 0.94 mg/dL (ref 0.61–1.24)
GFR calc Af Amer: >60 mL/min (ref >60)
GFR calc non Af Amer: >60 mL/min (ref >60)
GLUCOSE: 144 mg/dL — AB (ref 70–99)
POTASSIUM: 3.1 mmol/L — AB (ref 3.5–5.1)
Sodium: 138 mmol/L (ref 135–145)
Total Bilirubin: 0.6 mg/dL (ref 0.3–1.2)
Total Protein: 7 g/dL (ref 6.5–8.1)

## 2018-02-04 LAB — MAGNESIUM: Magnesium: 1.8 mg/dL (ref 1.7–2.4)

## 2018-02-04 MED ORDER — FAMOTIDINE IN NACL 20-0.9 MG/50ML-% IV SOLN
20.0000 mg | Freq: Once | INTRAVENOUS | Status: AC
  Administered 2018-02-04: 20 mg via INTRAVENOUS
  Filled 2018-02-04: qty 50

## 2018-02-04 MED ORDER — SODIUM CHLORIDE 0.9 % IV SOLN
242.0000 mg | Freq: Once | INTRAVENOUS | Status: AC
  Administered 2018-02-04: 240 mg via INTRAVENOUS
  Filled 2018-02-04: qty 24

## 2018-02-04 MED ORDER — DIPHENHYDRAMINE HCL 50 MG/ML IJ SOLN
50.0000 mg | Freq: Once | INTRAMUSCULAR | Status: AC
  Administered 2018-02-04: 50 mg via INTRAVENOUS
  Filled 2018-02-04: qty 1

## 2018-02-04 MED ORDER — SODIUM CHLORIDE 0.9 % IV SOLN
20.0000 mg | Freq: Once | INTRAVENOUS | Status: AC
  Administered 2018-02-04: 20 mg via INTRAVENOUS
  Filled 2018-02-04: qty 2

## 2018-02-04 MED ORDER — SODIUM CHLORIDE 0.9 % IV SOLN
45.0000 mg/m2 | Freq: Once | INTRAVENOUS | Status: AC
  Administered 2018-02-04: 96 mg via INTRAVENOUS
  Filled 2018-02-04: qty 16

## 2018-02-04 MED ORDER — SODIUM CHLORIDE 0.9 % IV SOLN
Freq: Once | INTRAVENOUS | Status: AC
  Administered 2018-02-04: 10:00:00 via INTRAVENOUS
  Filled 2018-02-04: qty 1000

## 2018-02-04 MED ORDER — SODIUM CHLORIDE 0.9% FLUSH
10.0000 mL | INTRAVENOUS | Status: DC | PRN
  Administered 2018-02-04: 10 mL via INTRAVENOUS
  Filled 2018-02-04: qty 10

## 2018-02-04 MED ORDER — POTASSIUM CHLORIDE 10 MEQ/100ML IV SOLN: 10.0000 meq | INTRAVENOUS | Status: DC

## 2018-02-04 MED ORDER — HEPARIN SOD (PORK) LOCK FLUSH 100 UNIT/ML IV SOLN
500.0000 [IU] | Freq: Once | INTRAVENOUS | Status: AC
  Administered 2018-02-04: 500 [IU] via INTRAVENOUS
  Filled 2018-02-04: qty 5

## 2018-02-04 MED ORDER — SODIUM CHLORIDE 0.9 % IV SOLN
Freq: Once | INTRAVENOUS | Status: AC
  Administered 2018-02-04: 11:00:00 via INTRAVENOUS
  Filled 2018-02-04: qty 250

## 2018-02-04 MED ORDER — PALONOSETRON HCL INJECTION 0.25 MG/5ML
0.2500 mg | Freq: Once | INTRAVENOUS | Status: AC
  Administered 2018-02-04: 0.25 mg via INTRAVENOUS
  Filled 2018-02-04: qty 5

## 2018-02-04 NOTE — Progress Notes (Signed)
Patient here today for follow up.   

## 2018-02-04 NOTE — Progress Notes (Signed)
Hematology/Oncology Follow up note [facility] Telephone:(336) [phone] Fax:(336) [phone]   Patient Care Team: [name], MD as PCP - General (Internal Medicine) [name], RN as Registered Nurse   REASON FOR VISIT Follow up for treatment of Stage III lung adenocarcinoma  Oncology History:  Mark Mccarty is a  67 y.o.  male with  adenocarcinoma of lung, diagnosed via biopsy of right hilar lymph nodes.  Former 43 pack year smoking history.  Presents for assessment prior to concurrent chemo T3 N1 M0  INTERVAL HISTORY Mark Mccarty is a 67 y.o. male who has above history reviewed by me present for assessment prior to chemotherapy carbo and Taxol for concurrent chemoradiation. #Chemotherapy and radiation, patient has been tolerating treatment  very well.  His last radiation was scheduled on August 20. Denies any nausea vomiting, fingertip numbness or tingling.  #Constipation improved after started on Colace 100 mg daily. #Insomnia, he takes Ambien 5 mg nightly as needed for insomnia and reports sleeping better.   he mentioned that occasionally he will take 5 mg of Ambien at night 2:00 in the morning he will take another 5 mg of Ambien which can and when he woke up help him to sleep through morning.  Chronic SOB: Stable at baseline...  Denies any cough, chest pain, hemoptysis.  Appetite is good.  Weight is stable.  Review of Systems  Constitutional: Negative for chills, fever, malaise/fatigue and weight loss.  [name]: Negative for congestion, ear discharge, ear pain, nosebleeds, sinus pain and sore throat.   Eyes: Negative for double vision, photophobia, pain, discharge and redness.  Respiratory: Positive for shortness of breath. Negative for cough, hemoptysis, sputum production and wheezing.   Cardiovascular: Negative for chest pain, palpitations, orthopnea, claudication and leg swelling.  Gastrointestinal: Negative for abdominal pain, blood in stool,  constipation, diarrhea, heartburn, melena, nausea and vomiting.  Genitourinary: Negative for dysuria, flank pain, frequency and hematuria.  Musculoskeletal: Negative for back pain, myalgias and neck pain.  Skin: Negative for itching and rash.  Neurological: Negative for dizziness, tingling, tremors, focal weakness, weakness and headaches.  Endo/Heme/Allergies: Negative for environmental allergies. Does not bruise/bleed easily.  Psychiatric/Behavioral: Negative for depression and hallucinations. The patient is not nervous/anxious and does not have insomnia.     MEDICAL HISTORY:  Past Medical History:  Diagnosis Date  . Asthma   . Cancer ([place])    lung  . Cataract    left cataract surgery  . Dyspnea   . [name]   . GERD (gastroesophageal reflux disease)   . Hypertension   . Neuromuscular disorder ([place])    pt has knees bilateral with tendon issues that cause pain    SURGICAL HISTORY: Past Surgical History:  Procedure Laterality Date  . CARDIAC CATHETERIZATION N/A 12/16/2014   Procedure: Left Heart Cath;  Surgeon: [name], MD;  Location: [place] [facility];  Service: Cardiovascular;  Laterality: N/A;  . CYST REMOVAL TRUNK     chest and back over time and it was removed  . ENDOBRONCHIAL ULTRASOUND N/A 12/09/2017   Procedure: ENDOBRONCHIAL ULTRASOUND;  Surgeon: [name], MD;  Location: [name];  Service: Pulmonary;  Laterality: N/A;  . left cataract surgery    . PORTA CATH INSERTION N/A 12/23/2017   Procedure: PORTA CATH INSERTION;  Surgeon: [name], MD;  Location: [place] [facility];  Service: Cardiovascular;  Laterality: N/A;    SOCIAL HISTORY: Social History   Socioeconomic History  . Marital status: Divorced  Spouse name: Not on file  . Number of children: Not on file  . Years of education: Not on file  . Highest education level: Not on file  Occupational History  . Not on file  Social Needs  . Financial resource strain: Not on  file  . Food insecurity:    Worry: Not on file    Inability: Not on file  . Transportation needs:    Medical: Not on file    Non-medical: Not on file  Tobacco Use  . Smoking status: Former Smoker    Packs/day: 1.50    Years: 35.00    Pack years: 52.50    Types: Cigarettes    Last attempt to quit: 06/22/2015    Years since quitting: 2.6  . Smokeless tobacco: Never Used  Substance and Sexual Activity  . Alcohol use: Yes    Comment: a fifth in a week  . Drug use: No  . Sexual activity: Not Currently  Lifestyle  . Physical activity:    Days per week: Not on file    Minutes per session: Not on file  . Stress: Not on file  Relationships  . Social connections:    Talks on phone: Not on file    Gets together: Not on file    Attends religious service: Not on file    Active member of club or organization: Not on file    Attends meetings of clubs or organizations: Not on file    Relationship status: Not on file  . Intimate partner violence:    Fear of current or ex partner: Not on file    Emotionally abused: Not on file    Physically abused: Not on file    Forced sexual activity: Not on file  Other Topics Concern  . Not on file  Social History Narrative  . Not on file    FAMILY HISTORY: Family History  Problem Relation Age of Onset  . Hypertension Mother   . Breast cancer Mother   . Heart attack Mother   . Lung cancer Father   . Heart disease Sister   . Diabetes Sister   . Colon cancer Sister     ALLERGIES:  is allergic to penicillins.  MEDICATIONS:  Current Outpatient Medications  Medication Sig Dispense Refill  . acetaminophen (TYLENOL) 500 MG tablet Take 500 mg by mouth as needed.    [name] albuterol (PROVENTIL HFA;VENTOLIN HFA) 108 (90 Base) MCG/ACT inhaler Inhale 2 puffs into the lungs every 6 (six) hours as needed.  12  . amLODipine (NORVASC) 10 MG tablet Take 10 mg by mouth daily.    Jearl Klinefelter ELLIPTA 62.5-25 MCG/INH AEPB TAKE 1 PUFF BY MOUTH EVERY DAY 180 each 3   . aspirin 81 MG tablet Take 81 mg by mouth daily.    . carvedilol (COREG) 25 MG tablet Take 25 mg by mouth 2 (two) times daily with a meal.    . docusate sodium (COLACE) 100 MG capsule Take 1 capsule (100 mg total) by mouth daily as needed for mild constipation. Do not take if you have loose stools 30 capsule 3  . doxycycline (VIBRAMYCIN) 100 MG capsule Take 100 mg by mouth 2 (two) times daily.    [name] gabapentin (NEURONTIN) 300 MG capsule Take 300 mg by mouth at bedtime as needed.     [name] ibuprofen (ADVIL,MOTRIN) 600 MG tablet Take 600 mg by mouth every 6 (six) hours as needed.    . lidocaine-prilocaine (EMLA) cream Apply to affected  area once 30 g 3  . lisinopril (PRINIVIL,ZESTRIL) 40 MG tablet Take 40 mg by mouth daily.    [name] omeprazole (PRILOSEC) 20 MG capsule Take 20 mg by mouth 2 (two) times daily before a meal.    . ondansetron (ZOFRAN) 8 MG tablet Take 1 tablet (8 mg total) by mouth 2 (two) times daily as needed for refractory nausea / vomiting. 30 tablet 1  . potassium chloride SA (K-DUR,KLOR-CON) 20 MEQ tablet Take 1 tablet (20 mEq total) by mouth daily. 30 tablet 1  . prochlorperazine (COMPAZINE) 10 MG tablet Take 1 tablet (10 mg total) by mouth every 6 (six) hours as needed (Nausea or vomiting). 30 tablet 1  . sildenafil (VIAGRA) 100 MG tablet Take 100 mg by mouth as needed for erectile dysfunction.    . tamsulosin (FLOMAX) 0.4 MG CAPS capsule Take 0.4 mg by mouth daily.    [name] zolpidem (AMBIEN) 5 MG tablet Take 1 tablet (5 mg total) by mouth at bedtime as needed for sleep. 30 tablet 0   No current facility-administered medications for this visit.    Facility-Administered Medications Ordered in Other Visits  Medication Dose Route Frequency Provider Last Rate Last Dose  . CARBOplatin (PARAPLATIN) 240 mg in sodium chloride 0.9 % 250 mL chemo infusion  240 mg Intravenous Once [name], MD      . dexamethasone (DECADRON) 20 mg in sodium chloride 0.9 % 50 mL IVPB  20 mg Intravenous Once [name], MD      . heparin lock flush 100 unit/mL  500 Units Intravenous Once [name], MD      . PACLitaxel (TAXOL) 96 mg in sodium chloride 0.9 % 250 mL chemo infusion (</= 80mg /m2)  45 mg/m2 (Treatment Plan Recorded) Intravenous Once [name], MD      . sodium chloride flush (NS) 0.9 % injection 10 mL  10 mL Intravenous PRN [name], MD   10 mL at 02/04/18 0805     PHYSICAL EXAMINATION: ECOG PERFORMANCE STATUS: 0 - Asymptomatic Vitals:   02/04/18 0833  BP: 137/74  Pulse: 77  Resp: 18  Temp: (!) 96.3 F (35.7 C)  SpO2: 95%   Filed Weights   02/04/18 0833  Weight: 212 lb 5 oz (96.3 kg)    Physical Exam  Constitutional: He is oriented to person, place, and time. He appears well-developed and well-nourished. No distress.  [name]:  Head: Normocephalic and atraumatic.  Right Ear: External ear normal.  Left Ear: External ear normal.  Mouth/Throat: Oropharynx is clear and moist.  Eyes: Pupils are equal, round, and reactive to light. Conjunctivae and EOM are normal. No scleral icterus.  Neck: Normal range of motion. Neck supple.  Cardiovascular: Normal rate, regular rhythm and normal heart sounds.  Pulmonary/Chest: Effort normal. No respiratory distress. He has no wheezes. He has no rales. He exhibits no tenderness.  Decreased breath sounds bilaterally  Abdominal: Soft. Bowel sounds are normal. He exhibits no distension and no mass. There is no tenderness.  Musculoskeletal: Normal range of motion. He exhibits no edema or deformity.  Lymphadenopathy:    He has no cervical adenopathy.  Neurological: He is alert and oriented to person, place, and time. No cranial nerve deficit. Coordination normal.  Skin: Skin is warm and dry. No rash noted. No erythema.  Psychiatric: He has a normal mood and affect. His behavior is normal. Thought content normal.     LABORATORY DATA:  I have reviewed the data as listed Lab Results  Component  Value Date   WBC 4.2 02/04/2018   HGB 13.5 02/04/2018     HCT 38.6 (L) 02/04/2018   MCV 93.2 02/04/2018   PLT 190 02/04/2018   Recent Labs    01/21/18 0757 01/28/18 0801 02/04/18 0808  NA 140 138 138  K 3.6 3.4* 3.1*  CL 104 103 102  CO2 26 26 24   GLUCOSE 123* 114* 144*  BUN 10 14 13   CREATININE 0.83 0.86 0.94  CALCIUM 8.8* 9.0 8.9  GFRNONAA >60 >60 >60  GFRAA >60 >60 >60  PROT 6.7 6.9 7.0  ALBUMIN 3.6 3.7 3.6  AST 19 20 23   ALT 18 20 20   ALKPHOS 53 55 56  BILITOT 0.5 0.5 0.6       ASSESSMENT & PLAN:  1. Primary lung adenocarcinoma, right ([place])   T3 N1 M0 lung adenocarcinoma #  labs reviewed and discussed with patient counts are acceptable to proceed with today's chemotherapy with [place] and Taxol. #Constipation, improved.  Continue Colace 100 mg daily. #Insomnia continue Ambien as instructed.  Recommend patient to use 5 mg of Ambien only and limit doubling the dose himself.  He voices understanding. #Hypokalemia, potassium 3.1.  Continue K-Dur 20 meq  p.o. Daily.  Will check magnesium level.  We will give him IV calcium chloride x1. 17mEq.  All questions were answered. The patient knows to call the clinic with any problems questions or concerns.  Return of visit: 1 week for assessment for next cycle of chemotherapy. Total face to face encounter time for this patient visit was 15 min. >50% of the time was  spent in counseling and coordination of care.   [name], MD, PhD Hematology Oncology [facility] at [facility] Pager- [phone] 02/04/2018

## 2018-02-04 NOTE — Addendum Note (Signed)
Addended by: [name] on: 02/04/2018 10:37 AM   Modules accepted: Orders

## 2018-02-04 NOTE — Addendum Note (Signed)
Addended by: [name] on: 02/04/2018 10:27 AM   Modules accepted: Orders

## 2018-02-05 ENCOUNTER — Ambulatory Visit
Admission: RE | Admit: 2018-02-05 | Discharge: 2018-02-05 | Disposition: A | Discharge status: Home / Self Care | Payer: Medicare Other | Source: Ambulatory Visit | Attending: Radiation Oncology | Admitting: Radiation Oncology

## 2018-02-05 DIAGNOSIS — C3411 Malignant neoplasm of upper lobe, right bronchus or lung: Secondary | ICD-10-CM | POA: Diagnosis not present

## 2018-02-06 ENCOUNTER — Ambulatory Visit
Admission: RE | Admit: 2018-02-06 | Discharge: 2018-02-06 | Disposition: A | Discharge status: Home / Self Care | Payer: Medicare Other | Source: Ambulatory Visit | Attending: Radiation Oncology | Admitting: Radiation Oncology

## 2018-02-06 DIAGNOSIS — C3411 Malignant neoplasm of upper lobe, right bronchus or lung: Secondary | ICD-10-CM | POA: Diagnosis not present

## 2018-02-06 LAB — MISC LABCORP TEST (SEND OUT): LABCORP TEST CODE: 8474

## 2018-02-07 ENCOUNTER — Ambulatory Visit
Admission: RE | Admit: 2018-02-07 | Discharge: 2018-02-07 | Disposition: A | Discharge status: Home / Self Care | Payer: Medicare Other | Source: Ambulatory Visit | Attending: Radiation Oncology | Admitting: Radiation Oncology

## 2018-02-07 DIAGNOSIS — C3411 Malignant neoplasm of upper lobe, right bronchus or lung: Secondary | ICD-10-CM | POA: Diagnosis not present

## 2018-02-10 ENCOUNTER — Ambulatory Visit
Admission: RE | Admit: 2018-02-10 | Discharge: 2018-02-10 | Disposition: A | Discharge status: Home / Self Care | Payer: Medicare Other | Source: Ambulatory Visit | Attending: Radiation Oncology | Admitting: Radiation Oncology

## 2018-02-10 DIAGNOSIS — C3411 Malignant neoplasm of upper lobe, right bronchus or lung: Secondary | ICD-10-CM | POA: Diagnosis not present

## 2018-02-11 ENCOUNTER — Inpatient Hospital Stay (HOSPITAL_BASED_OUTPATIENT_CLINIC_OR_DEPARTMENT_OTHER): Payer: Medicare Other | Admitting: Oncology

## 2018-02-11 ENCOUNTER — Other Ambulatory Visit: Payer: Self-pay

## 2018-02-11 ENCOUNTER — Ambulatory Visit
Admission: RE | Admit: 2018-02-11 | Discharge: 2018-02-11 | Disposition: A | Discharge status: Home / Self Care | Payer: Medicare Other | Source: Ambulatory Visit | Attending: Radiation Oncology | Admitting: Radiation Oncology

## 2018-02-11 ENCOUNTER — Encounter: Payer: Self-pay | Admitting: Oncology

## 2018-02-11 ENCOUNTER — Inpatient Hospital Stay: Payer: Medicare Other | Admitting: *Deleted

## 2018-02-11 ENCOUNTER — Inpatient Hospital Stay: Payer: Medicare Other

## 2018-02-11 VITALS — BP 134/69 | HR 92 | Temp 98.1°F | Resp 18 | Wt 213.2 lb

## 2018-02-11 DIAGNOSIS — C3491 Malignant neoplasm of unspecified part of right bronchus or lung: Secondary | ICD-10-CM

## 2018-02-11 DIAGNOSIS — C3411 Malignant neoplasm of upper lobe, right bronchus or lung: Secondary | ICD-10-CM | POA: Diagnosis not present

## 2018-02-11 DIAGNOSIS — R509 Fever, unspecified: Secondary | ICD-10-CM

## 2018-02-11 DIAGNOSIS — R05 Cough: Secondary | ICD-10-CM

## 2018-02-11 DIAGNOSIS — Z87891 Personal history of nicotine dependence: Secondary | ICD-10-CM

## 2018-02-11 DIAGNOSIS — Z5111 Encounter for antineoplastic chemotherapy: Secondary | ICD-10-CM

## 2018-02-11 DIAGNOSIS — R519 Headache, unspecified: Secondary | ICD-10-CM

## 2018-02-11 DIAGNOSIS — E876 Hypokalemia: Secondary | ICD-10-CM | POA: Diagnosis not present

## 2018-02-11 DIAGNOSIS — R059 Cough, unspecified: Secondary | ICD-10-CM

## 2018-02-11 DIAGNOSIS — R51 Headache: Secondary | ICD-10-CM | POA: Diagnosis not present

## 2018-02-11 DIAGNOSIS — D701 Agranulocytosis secondary to cancer chemotherapy: Secondary | ICD-10-CM | POA: Diagnosis not present

## 2018-02-11 DIAGNOSIS — Z95828 Presence of other vascular implants and grafts: Secondary | ICD-10-CM

## 2018-02-11 LAB — CBC WITH DIFFERENTIAL/PLATELET
BASOS PCT: 1 %
Basophils Absolute: 0 10*3/uL (ref 0–0.1)
EOS ABS: 0 10*3/uL (ref 0–0.7)
EOS PCT: 1 %
HCT: 37.3 % — ABNORMAL LOW (ref 40.0–52.0)
Hemoglobin: 13 g/dL (ref 13.0–18.0)
LYMPHS ABS: 0.4 10*3/uL — AB (ref 1.0–3.6)
Lymphocytes Relative: 11 %
MCH: 32.6 pg (ref 26.0–34.0)
MCHC: 34.9 g/dL (ref 32.0–36.0)
MCV: 93.5 fL (ref 80.0–100.0)
MONOS PCT: 15 %
Monocytes Absolute: 0.6 10*3/uL (ref 0.2–1.0)
NEUTROS PCT: 72 %
Neutro Abs: 2.7 10*3/uL (ref 1.4–6.5)
Platelets: 226 10*3/uL (ref 150–440)
RBC: 3.99 MIL/uL — ABNORMAL LOW (ref 4.40–5.90)
RDW: 15.5 % — ABNORMAL HIGH (ref 11.5–14.5)
WBC: 3.7 10*3/uL — ABNORMAL LOW (ref 3.8–10.6)

## 2018-02-11 LAB — COMPREHENSIVE METABOLIC PANEL
ALT: 21 U/L (ref 0–44)
AST: 18 U/L (ref 15–41)
Albumin: 3.7 g/dL (ref 3.5–5.0)
Alkaline Phosphatase: 54 U/L (ref 38–126)
Anion gap: 11 (ref 5–15)
BUN: 13 mg/dL (ref 8–23)
CO2: 23 mmol/L (ref 22–32)
CREATININE: 0.85 mg/dL (ref 0.61–1.24)
Calcium: 9 mg/dL (ref 8.9–10.3)
Chloride: 101 mmol/L (ref 98–111)
GFR calc non Af Amer: >60 mL/min (ref >60)
Glucose, Bld: 120 mg/dL — ABNORMAL HIGH (ref 70–99)
Potassium: 3.6 mmol/L (ref 3.5–5.1)
SODIUM: 135 mmol/L (ref 135–145)
Total Bilirubin: 0.5 mg/dL (ref 0.3–1.2)
Total Protein: 6.8 g/dL (ref 6.5–8.1)

## 2018-02-11 MED ORDER — HEPARIN SOD (PORK) LOCK FLUSH 100 UNIT/ML IV SOLN
INTRAVENOUS | Status: AC
  Filled 2018-02-11: qty 5

## 2018-02-11 MED ORDER — HEPARIN SOD (PORK) LOCK FLUSH 100 UNIT/ML IV SOLN
500.0000 [IU] | Freq: Once | INTRAVENOUS | Status: AC
  Administered 2018-02-11: 500 [IU] via INTRAVENOUS

## 2018-02-11 NOTE — Progress Notes (Signed)
No treatment Per Almyra Free CMA per Dr. Tasia Catchings. Pt stable at time of discharge.

## 2018-02-11 NOTE — Progress Notes (Signed)
Hematology/Oncology Follow up note [facility] [phone] [phone]   Patient Care Team: Mark Kayser, MD as PCP - General (Internal Medicine) Mark Nab, RN as Registered Nurse   REASON FOR VISIT Follow up for treatment of Stage III lung adenocarcinoma  Oncology History:  Mark Mccarty is a  67 y.o.  male with  adenocarcinoma of lung, diagnosed via biopsy of right hilar lymph nodes.  Former 43 pack year smoking history.  Presents for assessment prior to concurrent chemo T3 N1 M0  INTERVAL HISTORY Mark Mccarty is a 67 y.o. male who has above history reviewed by me presents for assessment prior to chemotherapy, and Taxol for concurrent chemoradiation.  #He reports not feeling well today.  He has developed a dry cough feels like some postnasal drip.  Started on Sunday.  Also had a headache across forehead.  Temperature was 99.5 earlier at the radiation department.  In our clinic was 98.1. Denies any chills, chest pain, abdominal pain, dysuria symptoms. Chronic SOB: Stable at baseline..  Review of Systems  Constitutional: Negative for chills, fever, malaise/fatigue and weight loss.  [name]: Negative for congestion, ear discharge, ear pain, nosebleeds, sinus pain and sore throat.   Eyes: Negative for double vision, photophobia, pain, discharge and redness.  Respiratory: Positive for cough and shortness of breath. Negative for hemoptysis, sputum production and wheezing.   Cardiovascular: Negative for chest pain, palpitations, orthopnea, claudication and leg swelling.  Gastrointestinal: Negative for abdominal pain, blood in stool, constipation, diarrhea, heartburn, melena, nausea and vomiting.  Genitourinary: Negative for dysuria, flank pain, frequency and hematuria.  Musculoskeletal: Negative for back pain, myalgias and neck pain.  Skin: Negative for itching and rash.  Neurological: Positive for headaches. Negative for dizziness,  tingling, tremors, focal weakness and weakness.  Endo/Heme/Allergies: Negative for environmental allergies. Does not bruise/bleed easily.  Psychiatric/Behavioral: Negative for depression and hallucinations. The patient is not nervous/anxious and does not have insomnia.     MEDICAL HISTORY:  Past Medical History:  Diagnosis Date  . Asthma   . Cancer ([place])    lung  . Cataract    left cataract surgery  . Dyspnea   . [name]   . GERD (gastroesophageal reflux disease)   . Hypertension   . Neuromuscular disorder ([place])    pt has knees bilateral with tendon issues that cause pain    SURGICAL HISTORY: Past Surgical History:  Procedure Laterality Date  . CARDIAC CATHETERIZATION N/A 12/16/2014   Procedure: Left Heart Cath;  Surgeon: [name], MD;  Location: [place] [facility];  Service: Cardiovascular;  Laterality: N/A;  . CYST REMOVAL TRUNK     chest and back over time and it was removed  . ENDOBRONCHIAL ULTRASOUND N/A 12/09/2017   Procedure: ENDOBRONCHIAL ULTRASOUND;  Surgeon: [name], MD;  Location: [name];  Service: Pulmonary;  Laterality: N/A;  . left cataract surgery    . PORTA CATH INSERTION N/A 12/23/2017   Procedure: PORTA CATH INSERTION;  Surgeon: [name], MD;  Location: [place] [facility];  Service: Cardiovascular;  Laterality: N/A;    SOCIAL HISTORY: Social History   Socioeconomic History  . Marital status: Divorced    Spouse name: Not on file  . Number of children: Not on file  . Years of education: Not on file  . Highest education level: Not on file  Occupational History  . Not on file  Social Needs  . Financial resource strain: Not on file  . Food insecurity:  Worry: Not on file    Inability: Not on file  . Transportation needs:    Medical: Not on file    Non-medical: Not on file  Tobacco Use  . Smoking status: Former Smoker    Packs/day: 1.50    Years: 35.00    Pack years: 52.50    Types: Cigarettes    Last  attempt to quit: 06/22/2015    Years since quitting: 2.6  . Smokeless tobacco: Never Used  Substance and Sexual Activity  . Alcohol use: Yes    Comment: a fifth in a week  . Drug use: No  . Sexual activity: Not Currently  Lifestyle  . Physical activity:    Days per week: Not on file    Minutes per session: Not on file  . Stress: Not on file  Relationships  . Social connections:    Talks on phone: Not on file    Gets together: Not on file    Attends religious service: Not on file    Active member of club or organization: Not on file    Attends meetings of clubs or organizations: Not on file    Relationship status: Not on file  . Intimate partner violence:    Fear of current or ex partner: Not on file    Emotionally abused: Not on file    Physically abused: Not on file    Forced sexual activity: Not on file  Other Topics Concern  . Not on file  Social History Narrative  . Not on file    FAMILY HISTORY: Family History  Problem Relation Age of Onset  . Hypertension Mother   . Breast cancer Mother   . Heart attack Mother   . Lung cancer Father   . Heart disease Sister   . Diabetes Sister   . Colon cancer Sister     ALLERGIES:  is allergic to penicillins.  MEDICATIONS:  Current Outpatient Medications  Medication Sig Dispense Refill  . acetaminophen (TYLENOL) 500 MG tablet Take 500 mg by mouth as needed.    [name] albuterol (PROVENTIL HFA;VENTOLIN HFA) 108 (90 Base) MCG/ACT inhaler Inhale 2 puffs into the lungs every 6 (six) hours as needed.  12  . amLODipine (NORVASC) 10 MG tablet Take 10 mg by mouth daily.    Jearl Klinefelter ELLIPTA 62.5-25 MCG/INH AEPB TAKE 1 PUFF BY MOUTH EVERY DAY 180 each 3  . aspirin 81 MG tablet Take 81 mg by mouth daily.    . carvedilol (COREG) 25 MG tablet Take 25 mg by mouth 2 (two) times daily with a meal.    . docusate sodium (COLACE) 100 MG capsule Take 1 capsule (100 mg total) by mouth daily as needed for mild constipation. Do not take if you have  loose stools 30 capsule 3  . doxycycline (VIBRAMYCIN) 100 MG capsule Take 100 mg by mouth 2 (two) times daily.    [name] gabapentin (NEURONTIN) 300 MG capsule Take 300 mg by mouth at bedtime as needed.     [name] ibuprofen (ADVIL,MOTRIN) 600 MG tablet Take 600 mg by mouth every 6 (six) hours as needed.    . lidocaine-prilocaine (EMLA) cream Apply to affected area once 30 g 3  . lisinopril (PRINIVIL,ZESTRIL) 40 MG tablet Take 40 mg by mouth daily.    [name] omeprazole (PRILOSEC) 20 MG capsule Take 20 mg by mouth 2 (two) times daily before a meal.    . ondansetron (ZOFRAN) 8 MG tablet Take 1 tablet (8 mg total) by  mouth 2 (two) times daily as needed for refractory nausea / vomiting. 30 tablet 1  . potassium chloride SA (K-DUR,KLOR-CON) 20 MEQ tablet Take 1 tablet (20 mEq total) by mouth daily. 30 tablet 1  . prochlorperazine (COMPAZINE) 10 MG tablet Take 1 tablet (10 mg total) by mouth every 6 (six) hours as needed (Nausea or vomiting). 30 tablet 1  . sildenafil (VIAGRA) 100 MG tablet Take 100 mg by mouth as needed for erectile dysfunction.    . tamsulosin (FLOMAX) 0.4 MG CAPS capsule Take 0.4 mg by mouth daily.    [name] zolpidem (AMBIEN) 5 MG tablet Take 1 tablet (5 mg total) by mouth at bedtime as needed for sleep. 30 tablet 0   No current facility-administered medications for this visit.      PHYSICAL EXAMINATION: ECOG PERFORMANCE STATUS: 0 - Asymptomatic Vitals:   02/11/18 1004  BP: 134/69  Pulse: 92  Resp: 18  Temp: 98.1 F (36.7 C)   Filed Weights   02/11/18 1004  Weight: 213 lb 3 oz (96.7 kg)    Physical Exam  Constitutional: He is oriented to person, place, and time. He appears well-developed and well-nourished. No distress.  [name]:  Head: Normocephalic and atraumatic.  Right Ear: External ear normal.  Left Ear: External ear normal.  Mouth/Throat: Oropharynx is clear and moist.  Eyes: Pupils are equal, round, and reactive to light. Conjunctivae and EOM are normal. No scleral icterus.    Neck: Normal range of motion. Neck supple.  Cardiovascular: Normal rate, regular rhythm and normal heart sounds.  Pulmonary/Chest: Effort normal. No respiratory distress. He has no wheezes. He has no rales. He exhibits no tenderness.  Decreased breath sounds bilaterally  Abdominal: Soft. Bowel sounds are normal. He exhibits no distension and no mass. There is no tenderness.  Musculoskeletal: Normal range of motion. He exhibits no edema or deformity.  Lymphadenopathy:    He has no cervical adenopathy.  Neurological: He is alert and oriented to person, place, and time. No cranial nerve deficit. Coordination normal.  Skin: Skin is warm and dry. No rash noted. No erythema.  Psychiatric: He has a normal mood and affect. His behavior is normal. Thought content normal.     LABORATORY DATA:  I have reviewed the data as listed Lab Results  Component Value Date   WBC 3.7 (L) 02/11/2018   HGB 13.0 02/11/2018   HCT 37.3 (L) 02/11/2018   MCV 93.5 02/11/2018   PLT 226 02/11/2018   Recent Labs    01/28/18 0801 02/04/18 0808 02/11/18 0940  NA 138 138 135  K 3.4* 3.1* 3.6  CL 103 102 101  CO2 26 24 23   GLUCOSE 114* 144* 120*  BUN 14 13 13   CREATININE 0.86 0.94 0.85  CALCIUM 9.0 8.9 9.0  GFRNONAA >60 >60 >60  GFRAA >60 >60 >60  PROT 6.9 7.0 6.8  ALBUMIN 3.7 3.6 3.7  AST 20 23 18   ALT 20 20 21   ALKPHOS 55 56 54  BILITOT 0.5 0.6 0.5       ASSESSMENT & PLAN:  1. Primary lung adenocarcinoma, right ([place])   2. Encounter for antineoplastic chemotherapy   3. Cough   4. Nonintractable headache, unspecified chronicity pattern, unspecified headache type   T3 N1 M0 lung adenocarcinoma #Labs reviewed and discussed with patient.  Has mild leukopenia predominantly lymphocytopenia.  This is secondary to radiation and chemotherapy treatment. He was having some low-grade fever at radiation oncologist department, currently afebrile.  I have ordered blood culture  as well. I will hold today's  [place] and Taxol treatment today.  He can return on 8/16 to get this with carbotaxol treatment if he feels well. #Cough, nonproductive lung is clear auscultation.  Likely radiation.  Continue to monitor.  Getting worse, will obtain images. #Headache, advised patient to take OTC Tylenol 650 mg 6 hours headache symptoms get worse or persist not any improvement he will let me know #Hypokalemia, potassium improved to 3.6, continue K. Dur 20 mg p.o. daily.   All questions were answered. The patient knows to call the clinic with any problems questions or concerns.  Return of visit: 1 week for assessment for final cycle of chemotherapy. Total face to face encounter time for this patient visit was 33min. >50% of the time was  spent in counseling and coordination of care.   [name], MD, PhD Hematology Oncology [facility] Pager- [phone] 02/11/2018

## 2018-02-11 NOTE — Progress Notes (Signed)
Patient here for follow up. Pt states he has been having a dry cough, he feels like something is hung in his throat. This started on Sunday.  Has a headache across forehead. Temp was 99.5 earlier, currently it is 98.1.

## 2018-02-12 ENCOUNTER — Ambulatory Visit
Admission: RE | Admit: 2018-02-12 | Discharge: 2018-02-12 | Disposition: A | Discharge status: Home / Self Care | Payer: Medicare Other | Source: Ambulatory Visit | Attending: Radiation Oncology | Admitting: Radiation Oncology

## 2018-02-12 DIAGNOSIS — C3411 Malignant neoplasm of upper lobe, right bronchus or lung: Secondary | ICD-10-CM | POA: Diagnosis not present

## 2018-02-12 LAB — CULTURE, FUNGUS WITHOUT SMEAR: Special Requests: NORMAL

## 2018-02-13 ENCOUNTER — Inpatient Hospital Stay: Payer: Medicare Other

## 2018-02-13 ENCOUNTER — Ambulatory Visit
Admission: RE | Admit: 2018-02-13 | Discharge: 2018-02-13 | Disposition: A | Discharge status: Home / Self Care | Payer: Medicare Other | Source: Ambulatory Visit | Attending: Radiation Oncology | Admitting: Radiation Oncology

## 2018-02-13 DIAGNOSIS — C3491 Malignant neoplasm of unspecified part of right bronchus or lung: Secondary | ICD-10-CM

## 2018-02-13 DIAGNOSIS — C3411 Malignant neoplasm of upper lobe, right bronchus or lung: Secondary | ICD-10-CM | POA: Diagnosis not present

## 2018-02-13 DIAGNOSIS — Z5111 Encounter for antineoplastic chemotherapy: Secondary | ICD-10-CM | POA: Diagnosis not present

## 2018-02-13 LAB — COMPREHENSIVE METABOLIC PANEL
ALBUMIN: 3.6 g/dL (ref 3.5–5.0)
ALT: 27 U/L (ref 0–44)
AST: 41 U/L (ref 15–41)
Alkaline Phosphatase: 47 U/L (ref 38–126)
Anion gap: 7 (ref 5–15)
BUN: 9 mg/dL (ref 8–23)
CHLORIDE: 104 mmol/L (ref 98–111)
CO2: 27 mmol/L (ref 22–32)
CREATININE: 0.74 mg/dL (ref 0.61–1.24)
Calcium: 8.8 mg/dL — ABNORMAL LOW (ref 8.9–10.3)
GFR calc non Af Amer: >60 mL/min (ref >60)
GLUCOSE: 147 mg/dL — AB (ref 70–99)
Potassium: 3.7 mmol/L (ref 3.5–5.1)
SODIUM: 138 mmol/L (ref 135–145)
Total Bilirubin: 0.5 mg/dL (ref 0.3–1.2)
Total Protein: 6.6 g/dL (ref 6.5–8.1)

## 2018-02-13 LAB — CBC WITH DIFFERENTIAL/PLATELET
Basophils Absolute: 0 10*3/uL (ref 0–0.1)
Basophils Relative: 0 %
EOS ABS: 0 10*3/uL (ref 0–0.7)
Eosinophils Relative: 1 %
HCT: 37 % — ABNORMAL LOW (ref 40.0–52.0)
Hemoglobin: 12.7 g/dL — ABNORMAL LOW (ref 13.0–18.0)
LYMPHS ABS: 0.3 10*3/uL — AB (ref 1.0–3.6)
Lymphocytes Relative: 11 %
MCH: 32 pg (ref 26.0–34.0)
MCHC: 34.3 g/dL (ref 32.0–36.0)
MCV: 93.1 fL (ref 80.0–100.0)
MONOS PCT: 14 %
Monocytes Absolute: 0.4 10*3/uL (ref 0.2–1.0)
NEUTROS PCT: 74 %
Neutro Abs: 2.2 10*3/uL (ref 1.4–6.5)
Platelets: 242 10*3/uL (ref 150–440)
RBC: 3.97 MIL/uL — ABNORMAL LOW (ref 4.40–5.90)
RDW: 15.7 % — ABNORMAL HIGH (ref 11.5–14.5)
WBC: 3 10*3/uL — ABNORMAL LOW (ref 3.8–10.6)

## 2018-02-13 MED ORDER — DEXAMETHASONE SODIUM PHOSPHATE 100 MG/10ML IJ SOLN
20.0000 mg | Freq: Once | INTRAMUSCULAR | Status: AC
  Administered 2018-02-13: 20 mg via INTRAVENOUS
  Filled 2018-02-13: qty 2

## 2018-02-13 MED ORDER — SODIUM CHLORIDE 0.9 % IV SOLN
Freq: Once | INTRAVENOUS | Status: AC
  Administered 2018-02-13: 11:00:00 via INTRAVENOUS
  Filled 2018-02-13: qty 1000

## 2018-02-13 MED ORDER — SODIUM CHLORIDE 0.9% FLUSH
10.0000 mL | INTRAVENOUS | Status: DC | PRN
  Administered 2018-02-13: 10 mL via INTRAVENOUS
  Filled 2018-02-13: qty 10

## 2018-02-13 MED ORDER — HEPARIN SOD (PORK) LOCK FLUSH 100 UNIT/ML IV SOLN: 500.0000 [IU] | Freq: Once | INTRAVENOUS | Status: DC

## 2018-02-13 MED ORDER — SODIUM CHLORIDE 0.9 % IV SOLN
242.0000 mg | Freq: Once | INTRAVENOUS | Status: AC
  Administered 2018-02-13: 240 mg via INTRAVENOUS
  Filled 2018-02-13: qty 24

## 2018-02-13 MED ORDER — FAMOTIDINE IN NACL 20-0.9 MG/50ML-% IV SOLN
20.0000 mg | Freq: Once | INTRAVENOUS | Status: AC
  Administered 2018-02-13: 20 mg via INTRAVENOUS
  Filled 2018-02-13: qty 50

## 2018-02-13 MED ORDER — HEPARIN SOD (PORK) LOCK FLUSH 100 UNIT/ML IV SOLN
500.0000 [IU] | Freq: Once | INTRAVENOUS | Status: AC | PRN
  Administered 2018-02-13: 500 [IU]

## 2018-02-13 MED ORDER — SODIUM CHLORIDE 0.9 % IV SOLN
45.0000 mg/m2 | Freq: Once | INTRAVENOUS | Status: AC
  Administered 2018-02-13: 96 mg via INTRAVENOUS
  Filled 2018-02-13: qty 16

## 2018-02-13 MED ORDER — PALONOSETRON HCL INJECTION 0.25 MG/5ML
0.2500 mg | Freq: Once | INTRAVENOUS | Status: AC
  Administered 2018-02-13: 0.25 mg via INTRAVENOUS
  Filled 2018-02-13: qty 5

## 2018-02-13 MED ORDER — DIPHENHYDRAMINE HCL 50 MG/ML IJ SOLN
50.0000 mg | Freq: Once | INTRAMUSCULAR | Status: AC
  Administered 2018-02-13: 50 mg via INTRAVENOUS
  Filled 2018-02-13: qty 1

## 2018-02-14 ENCOUNTER — Ambulatory Visit
Admission: RE | Admit: 2018-02-14 | Discharge: 2018-02-14 | Disposition: A | Discharge status: Home / Self Care | Payer: Medicare Other | Source: Ambulatory Visit | Attending: Radiation Oncology | Admitting: Radiation Oncology

## 2018-02-14 DIAGNOSIS — C3411 Malignant neoplasm of upper lobe, right bronchus or lung: Secondary | ICD-10-CM | POA: Diagnosis not present

## 2018-02-16 ENCOUNTER — Ambulatory Visit: Admission: RE | Admit: 2018-02-16 | Payer: Medicare Other | Source: Ambulatory Visit

## 2018-02-16 LAB — CULTURE, BLOOD (ROUTINE X 2)
CULTURE: NO GROWTH
Culture: NO GROWTH
SPECIAL REQUESTS: ADEQUATE
SPECIAL REQUESTS: ADEQUATE

## 2018-02-17 ENCOUNTER — Ambulatory Visit
Admission: RE | Admit: 2018-02-17 | Discharge: 2018-02-17 | Disposition: A | Discharge status: Home / Self Care | Payer: Medicare Other | Source: Ambulatory Visit | Attending: Radiation Oncology | Admitting: Radiation Oncology

## 2018-02-17 ENCOUNTER — Ambulatory Visit: Payer: Medicare Other

## 2018-02-17 DIAGNOSIS — C3411 Malignant neoplasm of upper lobe, right bronchus or lung: Secondary | ICD-10-CM | POA: Diagnosis not present

## 2018-02-18 ENCOUNTER — Ambulatory Visit
Admission: RE | Admit: 2018-02-18 | Discharge: 2018-02-18 | Disposition: A | Discharge status: Home / Self Care | Payer: Medicare Other | Source: Ambulatory Visit | Attending: Radiation Oncology | Admitting: Radiation Oncology

## 2018-02-18 DIAGNOSIS — C3411 Malignant neoplasm of upper lobe, right bronchus or lung: Secondary | ICD-10-CM | POA: Diagnosis not present

## 2018-02-19 ENCOUNTER — Inpatient Hospital Stay: Payer: Medicare Other

## 2018-02-19 ENCOUNTER — Inpatient Hospital Stay (HOSPITAL_BASED_OUTPATIENT_CLINIC_OR_DEPARTMENT_OTHER): Payer: Medicare Other | Admitting: Oncology

## 2018-02-19 ENCOUNTER — Encounter: Payer: Self-pay | Admitting: Oncology

## 2018-02-19 ENCOUNTER — Other Ambulatory Visit: Payer: Self-pay

## 2018-02-19 VITALS — BP 131/73 | HR 82 | Temp 96.5°F | Resp 18 | Wt 213.7 lb

## 2018-02-19 DIAGNOSIS — Z5111 Encounter for antineoplastic chemotherapy: Secondary | ICD-10-CM | POA: Diagnosis not present

## 2018-02-19 DIAGNOSIS — C3491 Malignant neoplasm of unspecified part of right bronchus or lung: Secondary | ICD-10-CM | POA: Diagnosis not present

## 2018-02-19 LAB — COMPREHENSIVE METABOLIC PANEL
ALBUMIN: 3.6 g/dL (ref 3.5–5.0)
ALK PHOS: 56 U/L (ref 38–126)
ALT: 19 U/L (ref 0–44)
AST: 17 U/L (ref 15–41)
Anion gap: 6 (ref 5–15)
BUN: 16 mg/dL (ref 8–23)
CALCIUM: 8.7 mg/dL — AB (ref 8.9–10.3)
CO2: 27 mmol/L (ref 22–32)
CREATININE: 1.14 mg/dL (ref 0.61–1.24)
Chloride: 101 mmol/L (ref 98–111)
GFR calc Af Amer: >60 mL/min (ref >60)
GFR calc non Af Amer: >60 mL/min (ref >60)
GLUCOSE: 126 mg/dL — AB (ref 70–99)
Potassium: 3.5 mmol/L (ref 3.5–5.1)
SODIUM: 134 mmol/L — AB (ref 135–145)
Total Bilirubin: 0.6 mg/dL (ref 0.3–1.2)
Total Protein: 6.6 g/dL (ref 6.5–8.1)

## 2018-02-19 LAB — CBC WITH DIFFERENTIAL/PLATELET
BASOS PCT: 0 %
Basophils Absolute: 0 10*3/uL (ref 0–0.1)
EOS ABS: 0 10*3/uL (ref 0–0.7)
Eosinophils Relative: 1 %
HEMATOCRIT: 36.2 % — AB (ref 40.0–52.0)
Hemoglobin: 12.6 g/dL — ABNORMAL LOW (ref 13.0–18.0)
Lymphocytes Relative: 12 %
Lymphs Abs: 0.5 10*3/uL — ABNORMAL LOW (ref 1.0–3.6)
MCH: 32.4 pg (ref 26.0–34.0)
MCHC: 34.8 g/dL (ref 32.0–36.0)
MCV: 92.9 fL (ref 80.0–100.0)
MONOS PCT: 12 %
Monocytes Absolute: 0.5 10*3/uL (ref 0.2–1.0)
NEUTROS ABS: 2.8 10*3/uL (ref 1.4–6.5)
Neutrophils Relative %: 75 %
Platelets: 259 10*3/uL (ref 150–440)
RBC: 3.9 MIL/uL — ABNORMAL LOW (ref 4.40–5.90)
RDW: 15.4 % — AB (ref 11.5–14.5)
WBC: 3.8 10*3/uL (ref 3.8–10.6)

## 2018-02-19 MED ORDER — PALONOSETRON HCL INJECTION 0.25 MG/5ML
0.2500 mg | Freq: Once | INTRAVENOUS | Status: AC
  Administered 2018-02-19: 0.25 mg via INTRAVENOUS
  Filled 2018-02-19: qty 5

## 2018-02-19 MED ORDER — HEPARIN SOD (PORK) LOCK FLUSH 100 UNIT/ML IV SOLN
500.0000 [IU] | Freq: Once | INTRAVENOUS | Status: AC
  Administered 2018-02-19: 500 [IU] via INTRAVENOUS
  Filled 2018-02-19: qty 5

## 2018-02-19 MED ORDER — FAMOTIDINE IN NACL 20-0.9 MG/50ML-% IV SOLN
20.0000 mg | Freq: Once | INTRAVENOUS | Status: AC
  Administered 2018-02-19: 20 mg via INTRAVENOUS
  Filled 2018-02-19: qty 50

## 2018-02-19 MED ORDER — SODIUM CHLORIDE 0.9% FLUSH
10.0000 mL | Freq: Once | INTRAVENOUS | Status: AC
  Administered 2018-02-19: 10 mL via INTRAVENOUS
  Filled 2018-02-19: qty 10

## 2018-02-19 MED ORDER — SODIUM CHLORIDE 0.9 % IV SOLN
45.0000 mg/m2 | Freq: Once | INTRAVENOUS | Status: AC
  Administered 2018-02-19: 96 mg via INTRAVENOUS
  Filled 2018-02-19: qty 16

## 2018-02-19 MED ORDER — SODIUM CHLORIDE 0.9 % IV SOLN
Freq: Once | INTRAVENOUS | Status: AC
  Administered 2018-02-19: 12:00:00 via INTRAVENOUS
  Filled 2018-02-19: qty 250

## 2018-02-19 MED ORDER — DIPHENHYDRAMINE HCL 50 MG/ML IJ SOLN
50.0000 mg | Freq: Once | INTRAMUSCULAR | Status: AC
  Administered 2018-02-19: 50 mg via INTRAVENOUS
  Filled 2018-02-19: qty 1

## 2018-02-19 MED ORDER — SODIUM CHLORIDE 0.9 % IV SOLN
240.0000 mg | Freq: Once | INTRAVENOUS | Status: AC
  Administered 2018-02-19: 240 mg via INTRAVENOUS
  Filled 2018-02-19: qty 24

## 2018-02-19 MED ORDER — SODIUM CHLORIDE 0.9 % IV SOLN
20.0000 mg | Freq: Once | INTRAVENOUS | Status: AC
  Administered 2018-02-19: 20 mg via INTRAVENOUS
  Filled 2018-02-19: qty 2

## 2018-02-19 NOTE — Progress Notes (Signed)
Hematology/Oncology Follow up note Mark Mccarty   Patient Care Team: Mark Kayser, MD as PCP - General (Internal Medicine) Mark Nab, RN as Registered Nurse   REASON FOR VISIT Follow up for treatment of Stage III lung adenocarcinoma  Oncology History:  Mark Mccarty is a  67 y.o.  male with  adenocarcinoma of lung, diagnosed via biopsy of right hilar lymph nodes.  Former 43 pack year smoking history.  Presents for assessment prior to concurrent chemo T3 N1 M0  INTERVAL HISTORY Mark Mccarty is a 67 y.o. male who has above history reviewed by me presents for assessment prior to concurrent chemotherapy and radiation.  Patient has finished radiation yesterday.  #Reports feeling well today.  No fever episodes.  Denies any chills, chest pain, abdominal pain, dysuria symptoms.  Chronic shortness of breath at baseline.  Review of Systems  Constitutional: Positive for malaise/fatigue. Negative for chills, fever and weight loss.  [name]: Negative for congestion, ear discharge, ear pain, nosebleeds, sinus pain and sore throat.   Eyes: Negative for double vision, photophobia, pain, discharge and redness.  Respiratory: Positive for shortness of breath. Negative for cough, hemoptysis, sputum production and wheezing.   Cardiovascular: Negative for chest pain, palpitations, orthopnea, claudication and leg swelling.  Gastrointestinal: Negative for abdominal pain, blood in stool, constipation, diarrhea, heartburn, melena, nausea and vomiting.  Genitourinary: Negative for dysuria, flank pain, frequency and hematuria.  Musculoskeletal: Negative for back pain, myalgias and neck pain.  Skin: Negative for itching and rash.  Neurological: Negative for dizziness, tingling, tremors, focal weakness, weakness and headaches.  Endo/Heme/Allergies: Negative for environmental allergies. Does not bruise/bleed easily.    Psychiatric/Behavioral: Negative for depression and hallucinations. The patient is not nervous/anxious and does not have insomnia.     MEDICAL HISTORY:  Past Medical History:  Diagnosis Date  . Asthma   . Cancer ([place])    lung  . Cataract    left cataract surgery  . Dyspnea   . [name]   . GERD (gastroesophageal reflux disease)   . Hypertension   . Neuromuscular disorder ([place])    pt has knees bilateral with tendon issues that cause pain    SURGICAL HISTORY: Past Surgical History:  Procedure Laterality Date  . CARDIAC CATHETERIZATION N/A 12/16/2014   Procedure: Left Heart Cath;  Surgeon: [name], MD;  Location: [place] [facility];  Service: Cardiovascular;  Laterality: N/A;  . CYST REMOVAL TRUNK     chest and back over time and it was removed  . ENDOBRONCHIAL ULTRASOUND N/A 12/09/2017   Procedure: ENDOBRONCHIAL ULTRASOUND;  Surgeon: [name], MD;  Location: [name];  Service: Pulmonary;  Laterality: N/A;  . left cataract surgery    . PORTA CATH INSERTION N/A 12/23/2017   Procedure: PORTA CATH INSERTION;  Surgeon: [name], MD;  Location: [place] [facility];  Service: Cardiovascular;  Laterality: N/A;    SOCIAL HISTORY: Social History   Socioeconomic History  . Marital status: Divorced    Spouse name: Not on file  . Number of children: Not on file  . Years of education: Not on file  . Highest education level: Not on file  Occupational History  . Not on file  Social Needs  . Financial resource strain: Not on file  . Food insecurity:    Worry: Not on file    Inability: Not on file  . Transportation needs:    Medical: Not on file    Non-medical: Not  on file  Tobacco Use  . Smoking status: Former Smoker    Packs/day: 1.50    Years: 35.00    Pack years: 52.50    Types: Cigarettes    Last attempt to quit: 06/22/2015    Years since quitting: 2.6  . Smokeless tobacco: Never Used  Substance and Sexual Activity  . Alcohol use: Yes     Comment: a fifth in a week  . Drug use: No  . Sexual activity: Not Currently  Lifestyle  . Physical activity:    Days per week: Not on file    Minutes per session: Not on file  . Stress: Not on file  Relationships  . Social connections:    Talks on phone: Not on file    Gets together: Not on file    Attends religious service: Not on file    Active member of club or organization: Not on file    Attends meetings of clubs or organizations: Not on file    Relationship status: Not on file  . Intimate partner violence:    Fear of current or ex partner: Not on file    Emotionally abused: Not on file    Physically abused: Not on file    Forced sexual activity: Not on file  Other Topics Concern  . Not on file  Social History Narrative  . Not on file    FAMILY HISTORY: Family History  Problem Relation Age of Onset  . Hypertension Mother   . Breast cancer Mother   . Heart attack Mother   . Lung cancer Father   . Heart disease Sister   . Diabetes Sister   . Colon cancer Sister     ALLERGIES:  is allergic to penicillins.  MEDICATIONS:  Current Outpatient Medications  Medication Sig Dispense Refill  . acetaminophen (TYLENOL) 500 MG tablet Take 500 mg by mouth as needed.    Mark Mccarty Kitchen albuterol (PROVENTIL HFA;VENTOLIN HFA) 108 (90 Base) MCG/ACT inhaler Inhale 2 puffs into the lungs every 6 (six) hours as needed.  12  . amLODipine (NORVASC) 10 MG tablet Take 10 mg by mouth daily.    Mark Mccarty Mark Mccarty 62.5-25 MCG/INH AEPB TAKE 1 PUFF BY MOUTH EVERY DAY 180 each 3  . aspirin 81 MG tablet Take 81 mg by mouth daily.    . carvedilol (COREG) 25 MG tablet Take 25 mg by mouth 2 (two) times daily with a meal.    . docusate sodium (COLACE) 100 MG capsule Take 1 capsule (100 mg total) by mouth daily as needed for mild constipation. Do not take if you have loose stools 30 capsule 3  . gabapentin (NEURONTIN) 300 MG capsule Take 300 mg by mouth at bedtime as needed.     Mark Mccarty Kitchen guaiFENesin (MUCINEX) 600 MG  12 hr tablet Take by mouth 2 (two) times daily as needed.    Mark Mccarty Kitchen ibuprofen (ADVIL,MOTRIN) 600 MG tablet Take 600 mg by mouth every 6 (six) hours as needed.    . lidocaine-prilocaine (EMLA) cream Apply to affected area once 30 g 3  . lisinopril (PRINIVIL,ZESTRIL) 40 MG tablet Take 40 mg by mouth daily.    Mark Mccarty Kitchen omeprazole (PRILOSEC) 20 MG capsule Take 20 mg by mouth 2 (two) times daily before a meal.    . ondansetron (ZOFRAN) 8 MG tablet Take 1 tablet (8 mg total) by mouth 2 (two) times daily as needed for refractory nausea / vomiting. 30 tablet 1  . potassium chloride SA (K-DUR,KLOR-CON) 20 MEQ tablet Take  1 tablet (20 mEq total) by mouth daily. 30 tablet 1  . prochlorperazine (COMPAZINE) 10 MG tablet Take 1 tablet (10 mg total) by mouth every 6 (six) hours as needed (Nausea or vomiting). 30 tablet 1  . sildenafil (VIAGRA) 100 MG tablet Take 100 mg by mouth as needed for erectile dysfunction.    . tamsulosin (FLOMAX) 0.4 MG CAPS capsule Take 0.4 mg by mouth daily.    Mark Mccarty Kitchen zolpidem (AMBIEN) 5 MG tablet Take 1 tablet (5 mg total) by mouth at bedtime as needed for sleep. 30 tablet 0  . doxycycline (VIBRAMYCIN) 100 MG capsule Take 100 mg by mouth 2 (two) times daily.     No current facility-administered medications for this visit.      PHYSICAL EXAMINATION: ECOG PERFORMANCE STATUS: 0 - Asymptomatic Vitals:   02/19/18 1037  BP: 131/73  Pulse: 82  Resp: 18  Temp: (!) 96.5 F (35.8 C)  SpO2: 92%   Filed Weights   02/19/18 1037  Weight: 213 lb 11.2 oz (96.9 kg)    Physical Exam  Constitutional: He is oriented to person, place, and time. He appears well-developed and well-nourished. No distress.  [name]:  Head: Normocephalic and atraumatic.  Right Ear: External ear normal.  Left Ear: External ear normal.  Mouth/Throat: Oropharynx is clear and moist.  Eyes: Pupils are equal, round, and reactive to light. Conjunctivae and EOM are normal. No scleral icterus.  Neck: Normal range of motion. Neck  supple.  Cardiovascular: Normal rate, regular rhythm and normal heart sounds.  Pulmonary/Chest: Effort normal. No respiratory distress. He has no wheezes. He has no rales. He exhibits no tenderness.  Decreased breath sounds bilaterally  Abdominal: Soft. Bowel sounds are normal. He exhibits no distension and no mass. There is no tenderness.  Musculoskeletal: Normal range of motion. He exhibits no edema or deformity.  Lymphadenopathy:    He has no cervical adenopathy.  Neurological: He is alert and oriented to person, place, and time. No cranial nerve deficit. Coordination normal.  Skin: Skin is warm and dry. No rash noted. No erythema.  Psychiatric: He has a normal mood and affect. His behavior is normal. Thought content normal.     LABORATORY DATA:  I have reviewed the data as listed Lab Results  Component Value Date   WBC 3.8 02/19/2018   HGB 12.6 (L) 02/19/2018   HCT 36.2 (L) 02/19/2018   MCV 92.9 02/19/2018   PLT 259 02/19/2018   Recent Labs    02/11/18 0940 02/13/18 1004 02/19/18 1025  NA 135 138 134*  K 3.6 3.7 3.5  CL 101 104 101  CO2 23 27 27   GLUCOSE 120* 147* 126*  BUN 13 9 16   CREATININE 0.85 0.74 1.14  CALCIUM 9.0 8.8* 8.7*  GFRNONAA >60 >60 >60  GFRAA >60 >60 >60  PROT 6.8 6.6 6.6  ALBUMIN 3.7 3.6 3.6  AST 18 41 17  ALT 21 27 19   ALKPHOS 54 47 56  BILITOT 0.5 0.5 0.6       ASSESSMENT & PLAN:  1. Primary lung adenocarcinoma, right ([place])   T3 N1 M0 lung adenocarcinoma  #Labs reviewed and discussed with patient.  Counts are acceptable to proceed with last dose of carboplatin and Taxol.  #I spent 20 minutes explained to patient about future plan.  We discussed that patient is going to have 3 to 4 weeks of break and repeat CT scan.  If stable disease or improved, will proceed with immunotherapy.  I discussed the mechanism  of action and rationale of using immunotherapy.  The goal of therapy is maintenance; and length of treatments 1 year discussed the  potential side effects of immunotherapy including but not limited to diarrhea; skin rash; respiratory failure, neurotoxicity, elevated LFTs/endocrine abnormalities etc.   #Hypokalemia, potassium improved to 3.5.  Continue potassium supplementation.    All questions were answered. The patient knows to call the clinic with any problems questions or concerns.  Return of visit: 4  week . Total face to face encounter time for this patient visit was 25 min. >50% of the time was  spent in counseling and coordination of care.  [name], MD, PhD Hematology Oncology [facility] Pager- [phone] 02/19/2018

## 2018-02-19 NOTE — Progress Notes (Signed)
DISCONTINUE ON PATHWAY REGIMEN - Non-Small Cell Lung     Administer weekly:     Paclitaxel      Carboplatin   **Always confirm dose/schedule in your pharmacy ordering system**  REASON: Continuation Of Treatment PRIOR TREATMENT: [id number]: Carboplatin AUC=2 + Paclitaxel 45 mg/m2 Weekly During Radiation TREATMENT RESPONSE: Unable to Evaluate  START ON PATHWAY REGIMEN - Non-Small Cell Lung     A cycle is every 14 days:     Durvalumab   **Always confirm dose/schedule in your pharmacy ordering system**  Patient Characteristics: Stage III - Unresectable, PS = 0, 1 AJCC T Category: T3 Current Disease Status: No Distant Mets or Local Recurrence AJCC N Category: N1 AJCC M Category: M0 AJCC 8 Stage Grouping: IIIA Performance Status: PS = 0, 1 Intent of Therapy: Curative Intent, Discussed with Patient

## 2018-02-19 NOTE — Progress Notes (Signed)
Patient here for follow up. Starting taking Mucinex for chest congestion.

## 2018-03-13 ENCOUNTER — Ambulatory Visit: Admission: RE | Admit: 2018-03-13 | Payer: Medicare Other | Source: Ambulatory Visit

## 2018-03-18 ENCOUNTER — Encounter: Payer: Self-pay | Admitting: Oncology

## 2018-03-18 ENCOUNTER — Inpatient Hospital Stay: Payer: Medicare Other | Attending: Oncology

## 2018-03-18 ENCOUNTER — Inpatient Hospital Stay: Payer: Medicare Other

## 2018-03-18 ENCOUNTER — Other Ambulatory Visit: Payer: Self-pay

## 2018-03-18 ENCOUNTER — Inpatient Hospital Stay (HOSPITAL_BASED_OUTPATIENT_CLINIC_OR_DEPARTMENT_OTHER): Payer: Medicare Other | Admitting: Oncology

## 2018-03-18 VITALS — BP 119/70 | HR 79 | Temp 97.2°F | Resp 18 | Wt 211.5 lb

## 2018-03-18 DIAGNOSIS — D649 Anemia, unspecified: Secondary | ICD-10-CM | POA: Insufficient documentation

## 2018-03-18 DIAGNOSIS — R0602 Shortness of breath: Secondary | ICD-10-CM

## 2018-03-18 DIAGNOSIS — C771 Secondary and unspecified malignant neoplasm of intrathoracic lymph nodes: Secondary | ICD-10-CM | POA: Insufficient documentation

## 2018-03-18 DIAGNOSIS — C3491 Malignant neoplasm of unspecified part of right bronchus or lung: Secondary | ICD-10-CM | POA: Diagnosis not present

## 2018-03-18 DIAGNOSIS — Z87891 Personal history of nicotine dependence: Secondary | ICD-10-CM | POA: Insufficient documentation

## 2018-03-18 DIAGNOSIS — R05 Cough: Secondary | ICD-10-CM | POA: Insufficient documentation

## 2018-03-18 DIAGNOSIS — Z79899 Other long term (current) drug therapy: Secondary | ICD-10-CM | POA: Diagnosis not present

## 2018-03-18 DIAGNOSIS — E876 Hypokalemia: Secondary | ICD-10-CM | POA: Insufficient documentation

## 2018-03-18 DIAGNOSIS — Z5112 Encounter for antineoplastic immunotherapy: Secondary | ICD-10-CM | POA: Diagnosis not present

## 2018-03-18 DIAGNOSIS — R059 Cough, unspecified: Secondary | ICD-10-CM

## 2018-03-18 LAB — COMPREHENSIVE METABOLIC PANEL
ALT: 13 U/L (ref 0–44)
AST: 21 U/L (ref 15–41)
Albumin: 3.6 g/dL (ref 3.5–5.0)
Alkaline Phosphatase: 57 U/L (ref 38–126)
Anion gap: 9 (ref 5–15)
BUN: 9 mg/dL (ref 8–23)
CALCIUM: 9.2 mg/dL (ref 8.9–10.3)
CHLORIDE: 102 mmol/L (ref 98–111)
CO2: 29 mmol/L (ref 22–32)
CREATININE: 0.85 mg/dL (ref 0.61–1.24)
Glucose, Bld: 153 mg/dL — ABNORMAL HIGH (ref 70–99)
Potassium: 3.1 mmol/L — ABNORMAL LOW (ref 3.5–5.1)
Sodium: 140 mmol/L (ref 135–145)
Total Bilirubin: 0.8 mg/dL (ref 0.3–1.2)
Total Protein: 7.1 g/dL (ref 6.5–8.1)

## 2018-03-18 LAB — CBC WITH DIFFERENTIAL/PLATELET
BASOS PCT: 0 %
Basophils Absolute: 0 10*3/uL (ref 0–0.1)
EOS ABS: 0 10*3/uL (ref 0–0.7)
Eosinophils Relative: 1 %
HCT: 34.9 % — ABNORMAL LOW (ref 40.0–52.0)
HEMOGLOBIN: 12.1 g/dL — AB (ref 13.0–18.0)
LYMPHS ABS: 0.7 10*3/uL — AB (ref 1.0–3.6)
Lymphocytes Relative: 13 %
MCH: 32.1 pg (ref 26.0–34.0)
MCHC: 34.6 g/dL (ref 32.0–36.0)
MCV: 93 fL (ref 80.0–100.0)
Monocytes Absolute: 0.6 10*3/uL (ref 0.2–1.0)
Monocytes Relative: 10 %
NEUTROS PCT: 76 %
Neutro Abs: 4.2 10*3/uL (ref 1.4–6.5)
Platelets: 252 10*3/uL (ref 150–440)
RBC: 3.75 MIL/uL — AB (ref 4.40–5.90)
RDW: 16.4 % — ABNORMAL HIGH (ref 11.5–14.5)
WBC: 5.5 10*3/uL (ref 3.8–10.6)

## 2018-03-18 LAB — TSH: TSH: 1.625 u[IU]/mL (ref 0.350–4.500)

## 2018-03-18 MED ORDER — HEPARIN SOD (PORK) LOCK FLUSH 100 UNIT/ML IV SOLN
500.0000 [IU] | Freq: Once | INTRAVENOUS | Status: AC
  Administered 2018-03-18: 500 [IU] via INTRAVENOUS

## 2018-03-18 MED ORDER — SODIUM CHLORIDE 0.9% FLUSH
10.0000 mL | INTRAVENOUS | Status: AC | PRN
  Administered 2018-03-18: 10 mL via INTRAVENOUS
  Filled 2018-03-18: qty 10

## 2018-03-18 NOTE — Progress Notes (Signed)
Patient here for follow up. Pt was started on antibiotics doxycycline and levaquin for sinus infection. Pt states he is having a productive cough.

## 2018-03-18 NOTE — Progress Notes (Signed)
Hematology/Oncology Follow up note [facility] Telephone:([phone] Fax:(336) [phone]   Patient Care Team: [name], MD as PCP - General (Internal Medicine) [name], RN as Registered Nurse   REASON FOR VISIT Follow up for treatment of Stage III lung adenocarcinoma  Oncology History:  Mark Mccarty is a  67 y.o.  male with  adenocarcinoma of lung, diagnosed via biopsy of right hilar lymph nodes.  Former 43 pack year smoking history.  Presents for assessment prior to concurrent chemo T3 N1 M0  INTERVAL HISTORY Mark Mccarty is a 67 y.o. male who has above history reviewed by me presents for assessment prior to starting maintenance immunotherapy with Durvalumab.  He supposes to get CT chest evaluation. He forgets and no showed his image appointment.  # Cough, chronic, he describes " on and off", worst in the morning when he gets up.  Non productive. Not associated with fever or chills.  # SOB at baseline.   Review of Systems  Constitutional: Positive for malaise/fatigue. Negative for chills, fever and weight loss.  [name]: Negative for congestion, ear discharge, ear pain, nosebleeds, sinus pain and sore throat.   Eyes: Negative for double vision, photophobia, pain, discharge and redness.  Respiratory: Positive for shortness of breath. Negative for cough, hemoptysis, sputum production and wheezing.   Cardiovascular: Negative for chest pain, palpitations, orthopnea, claudication and leg swelling.  Gastrointestinal: Negative for abdominal pain, blood in stool, constipation, diarrhea, heartburn, melena, nausea and vomiting.  Genitourinary: Negative for dysuria, flank pain, frequency and hematuria.  Musculoskeletal: Negative for back pain, myalgias and neck pain.  Skin: Negative for itching and rash.  Neurological: Negative for dizziness, tingling, tremors, focal weakness, weakness and headaches.  Endo/Heme/Allergies: Negative for environmental  allergies. Does not bruise/bleed easily.  Psychiatric/Behavioral: Negative for depression and hallucinations. The patient is not nervous/anxious and does not have insomnia.     MEDICAL HISTORY:  Past Medical History:  Diagnosis Date  . Asthma   . Cancer ([place])    lung  . Cataract    left cataract surgery  . Dyspnea   . [name]   . GERD (gastroesophageal reflux disease)   . Hypertension   . Neuromuscular disorder ([place])    pt has knees bilateral with tendon issues that cause pain    SURGICAL HISTORY: Past Surgical History:  Procedure Laterality Date  . CARDIAC CATHETERIZATION N/A 12/16/2014   Procedure: Left Heart Cath;  Surgeon: [name], MD;  Location: [place] [facility];  Service: Cardiovascular;  Laterality: N/A;  . CYST REMOVAL TRUNK     chest and back over time and it was removed  . ENDOBRONCHIAL ULTRASOUND N/A 12/09/2017   Procedure: ENDOBRONCHIAL ULTRASOUND;  Surgeon: [name], MD;  Location: [name];  Service: Pulmonary;  Laterality: N/A;  . left cataract surgery    . PORTA CATH INSERTION N/A 12/23/2017   Procedure: PORTA CATH INSERTION;  Surgeon: [name], MD;  Location: [place] [facility];  Service: Cardiovascular;  Laterality: N/A;    SOCIAL HISTORY: Social History   Socioeconomic History  . Marital status: Divorced    Spouse name: Not on file  . Number of children: Not on file  . Years of education: Not on file  . Highest education level: Not on file  Occupational History  . Not on file  Social Needs  . Financial resource strain: Not on file  . Food insecurity:    Worry: Not on file    Inability: Not on  file  . Transportation needs:    Medical: Not on file    Non-medical: Not on file  Tobacco Use  . Smoking status: Former Smoker    Packs/day: 1.50    Years: 35.00    Pack years: 52.50    Types: Cigarettes    Last attempt to quit: 06/22/2015    Years since quitting: 2.7  . Smokeless tobacco: Never Used  Substance  and Sexual Activity  . Alcohol use: Yes    Comment: a fifth in a week  . Drug use: No  . Sexual activity: Not Currently  Lifestyle  . Physical activity:    Days per week: Not on file    Minutes per session: Not on file  . Stress: Not on file  Relationships  . Social connections:    Talks on phone: Not on file    Gets together: Not on file    Attends religious service: Not on file    Active member of club or organization: Not on file    Attends meetings of clubs or organizations: Not on file    Relationship status: Not on file  . Intimate partner violence:    Fear of current or ex partner: Not on file    Emotionally abused: Not on file    Physically abused: Not on file    Forced sexual activity: Not on file  Other Topics Concern  . Not on file  Social History Narrative  . Not on file    FAMILY HISTORY: Family History  Problem Relation Age of Onset  . Hypertension Mother   . Breast cancer Mother   . Heart attack Mother   . Lung cancer Father   . Heart disease Sister   . Diabetes Sister   . Colon cancer Sister     ALLERGIES:  is allergic to penicillins.  MEDICATIONS:  Current Outpatient Medications  Medication Sig Dispense Refill  . acetaminophen (TYLENOL) 500 MG tablet Take 500 mg by mouth as needed.    [name] albuterol (PROVENTIL HFA;VENTOLIN HFA) 108 (90 Base) MCG/ACT inhaler Inhale 2 puffs into the lungs every 6 (six) hours as needed.  12  . amLODipine (NORVASC) 10 MG tablet Take 10 mg by mouth daily.    Jearl Klinefelter ELLIPTA 62.5-25 MCG/INH AEPB TAKE 1 PUFF BY MOUTH EVERY DAY 180 each 3  . aspirin 81 MG tablet Take 81 mg by mouth daily.    . carvedilol (COREG) 25 MG tablet Take 25 mg by mouth 2 (two) times daily with a meal.    . docusate sodium (COLACE) 100 MG capsule Take 1 capsule (100 mg total) by mouth daily as needed for mild constipation. Do not take if you have loose stools 30 capsule 3  . doxycycline (VIBRAMYCIN) 100 MG capsule Take 100 mg by mouth 2 (two) times  daily.    . fluticasone (FLONASE) 50 MCG/ACT nasal spray PLACE 1 SPRAY INTO BOTH NOSTRILS 2 (TWO) TIMES DAILY  0  . gabapentin (NEURONTIN) 300 MG capsule Take 300 mg by mouth at bedtime as needed.     [name] guaiFENesin (MUCINEX) 600 MG 12 hr tablet Take by mouth 2 (two) times daily as needed.    [name] ibuprofen (ADVIL,MOTRIN) 600 MG tablet Take 600 mg by mouth every 6 (six) hours as needed.    [name] levofloxacin (LEVAQUIN) 250 MG tablet Take 250 mg by mouth daily.    [name] lidocaine-prilocaine (EMLA) cream Apply to affected area once 30 g 3  . lisinopril (PRINIVIL,ZESTRIL) 40  MG tablet Take 40 mg by mouth daily.    . ondansetron (ZOFRAN) 8 MG tablet Take 1 tablet (8 mg total) by mouth 2 (two) times daily as needed for refractory nausea / vomiting. 30 tablet 1  . potassium chloride SA (K-DUR,KLOR-CON) 20 MEQ tablet Take 1 tablet (20 mEq total) by mouth daily. 30 tablet 1  . prochlorperazine (COMPAZINE) 10 MG tablet Take 1 tablet (10 mg total) by mouth every 6 (six) hours as needed (Nausea or vomiting). 30 tablet 1  . sildenafil (VIAGRA) 100 MG tablet Take 100 mg by mouth as needed for erectile dysfunction.    . tamsulosin (FLOMAX) 0.4 MG CAPS capsule Take 0.4 mg by mouth daily.    [name] zolpidem (AMBIEN) 5 MG tablet Take 1 tablet (5 mg total) by mouth at bedtime as needed for sleep. 30 tablet 0  . benzonatate (TESSALON) 200 MG capsule TAKE 1 CAPSULE BY MOUTH EVERY DAY 3 TIMES A DAY AS NEEDED FOR COUGH  0  . doxycycline (VIBRAMYCIN) 100 MG capsule Take by mouth.    [name] omeprazole (PRILOSEC) 20 MG capsule Take 20 mg by mouth 2 (two) times daily before a meal.     No current facility-administered medications for this visit.    Facility-Administered Medications Ordered in Other Visits  Medication Dose Route Frequency Provider Last Rate Last Dose  . sodium chloride flush (NS) 0.9 % injection 10 mL  10 mL Intravenous PRN [name], MD   10 mL at 03/18/18 0858     PHYSICAL EXAMINATION: ECOG PERFORMANCE STATUS: 0 -  Asymptomatic Vitals:   03/18/18 0914  BP: 119/70  Pulse: 79  Resp: 18  Temp: (!) 97.2 F (36.2 C)  SpO2: 94%   Filed Weights   03/18/18 0914  Weight: 211 lb 8 oz (95.9 kg)    Physical Exam  Constitutional: He is oriented to person, place, and time. He appears well-developed and well-nourished. No distress.  [name]:  Head: Normocephalic and atraumatic.  Right Ear: External ear normal.  Left Ear: External ear normal.  Mouth/Throat: Oropharynx is clear and moist.  Eyes: Pupils are equal, round, and reactive to light. Conjunctivae and EOM are normal. No scleral icterus.  Neck: Normal range of motion. Neck supple.  Cardiovascular: Normal rate, regular rhythm and normal heart sounds.  Pulmonary/Chest: Effort normal. No respiratory distress. He has no wheezes. He has no rales. He exhibits no tenderness.  Decreased breath sounds bilaterally  Abdominal: Soft. Bowel sounds are normal. He exhibits no distension and no mass. There is no tenderness.  Musculoskeletal: Normal range of motion. He exhibits no edema or deformity.  Lymphadenopathy:    He has no cervical adenopathy.  Neurological: He is alert and oriented to person, place, and time. No cranial nerve deficit. Coordination normal.  Skin: Skin is warm and dry. No rash noted. No erythema.  Psychiatric: He has a normal mood and affect. His behavior is normal. Thought content normal.     LABORATORY DATA:  I have reviewed the data as listed Lab Results  Component Value Date   WBC 5.5 03/18/2018   HGB 12.1 (L) 03/18/2018   HCT 34.9 (L) 03/18/2018   MCV 93.0 03/18/2018   PLT 252 03/18/2018   Recent Labs    02/13/18 1004 02/19/18 1025 03/18/18 0858  NA 138 134* 140  K 3.7 3.5 3.1*  CL 104 101 102  CO2 27 27 29   GLUCOSE 147* 126* 153*  BUN 9 16 9   CREATININE 0.74 1.14 0.85  CALCIUM 8.8* 8.7* 9.2  GFRNONAA >60 >60 >60  GFRAA >60 >60 >60  PROT 6.6 6.6 7.1  ALBUMIN 3.6 3.6 3.6  AST 41 17 21  ALT 27 19 13   ALKPHOS 47 56  57  BILITOT 0.5 0.6 0.8       ASSESSMENT & PLAN:  1. Primary lung adenocarcinoma, right ([place])   2. Cough   T3 N1 M0 lung adenocarcinoma  #Did not have CT chest done prior to today's visit.  Chronic cough. Recommend re-schedule CT chest for evaluation and also establish new baseline before starting immunotherapy.   #Hypokalemia, potassium 3.1. Recommend continue potassium supplementation.   # Anemia, stable. Hemoglobin 12.1 All questions were answered. The patient knows to call the clinic with any problems questions or concerns.  Return of visit: after CT scan.  Total face to face encounter time for this patient visit was 15 min. >50% of the time was  spent in counseling and coordination of care.  [name], MD, PhD Hematology Oncology [facility] Pager- [phone] 03/18/2018

## 2018-03-19 DIAGNOSIS — N401 Enlarged prostate with lower urinary tract symptoms: Secondary | ICD-10-CM | POA: Insufficient documentation

## 2018-03-19 DIAGNOSIS — N400 Enlarged prostate without lower urinary tract symptoms: Secondary | ICD-10-CM | POA: Insufficient documentation

## 2018-03-19 DIAGNOSIS — N138 Other obstructive and reflux uropathy: Secondary | ICD-10-CM | POA: Insufficient documentation

## 2018-03-21 ENCOUNTER — Ambulatory Visit
Admission: RE | Admit: 2018-03-21 | Discharge: 2018-03-21 | Disposition: A | Discharge status: Home / Self Care | Payer: Medicare Other | Source: Ambulatory Visit | Attending: Oncology | Admitting: Oncology

## 2018-03-21 DIAGNOSIS — J439 Emphysema, unspecified: Secondary | ICD-10-CM | POA: Insufficient documentation

## 2018-03-21 DIAGNOSIS — R590 Localized enlarged lymph nodes: Secondary | ICD-10-CM | POA: Insufficient documentation

## 2018-03-21 DIAGNOSIS — I700 Atherosclerosis of aorta: Secondary | ICD-10-CM | POA: Insufficient documentation

## 2018-03-21 DIAGNOSIS — C3491 Malignant neoplasm of unspecified part of right bronchus or lung: Secondary | ICD-10-CM | POA: Insufficient documentation

## 2018-03-22 DIAGNOSIS — R972 Elevated prostate specific antigen [PSA]: Secondary | ICD-10-CM | POA: Insufficient documentation

## 2018-03-23 ENCOUNTER — Other Ambulatory Visit: Payer: Self-pay | Admitting: Oncology

## 2018-03-24 ENCOUNTER — Inpatient Hospital Stay (HOSPITAL_BASED_OUTPATIENT_CLINIC_OR_DEPARTMENT_OTHER): Payer: Medicare Other | Admitting: Oncology

## 2018-03-24 ENCOUNTER — Ambulatory Visit
Admission: RE | Admit: 2018-03-24 | Discharge: 2018-03-24 | Disposition: A | Discharge status: Home / Self Care | Payer: Medicare Other | Source: Ambulatory Visit | Attending: Radiation Oncology | Admitting: Radiation Oncology

## 2018-03-24 ENCOUNTER — Encounter: Payer: Self-pay | Admitting: Oncology

## 2018-03-24 ENCOUNTER — Encounter: Payer: Self-pay | Admitting: Radiation Oncology

## 2018-03-24 ENCOUNTER — Other Ambulatory Visit: Payer: Self-pay

## 2018-03-24 ENCOUNTER — Inpatient Hospital Stay: Payer: Medicare Other

## 2018-03-24 VITALS — BP 148/73 | HR 86 | Temp 97.2°F | Resp 16 | Wt 211.5 lb

## 2018-03-24 VITALS — BP 135/74 | HR 79 | Temp 97.0°F | Resp 16 | Wt 210.4 lb

## 2018-03-24 DIAGNOSIS — Z5112 Encounter for antineoplastic immunotherapy: Secondary | ICD-10-CM

## 2018-03-24 DIAGNOSIS — C3491 Malignant neoplasm of unspecified part of right bronchus or lung: Secondary | ICD-10-CM

## 2018-03-24 DIAGNOSIS — C3411 Malignant neoplasm of upper lobe, right bronchus or lung: Secondary | ICD-10-CM

## 2018-03-24 DIAGNOSIS — E876 Hypokalemia: Secondary | ICD-10-CM | POA: Diagnosis not present

## 2018-03-24 DIAGNOSIS — R059 Cough, unspecified: Secondary | ICD-10-CM

## 2018-03-24 DIAGNOSIS — R05 Cough: Secondary | ICD-10-CM | POA: Diagnosis not present

## 2018-03-24 DIAGNOSIS — D649 Anemia, unspecified: Secondary | ICD-10-CM

## 2018-03-24 DIAGNOSIS — Z87891 Personal history of nicotine dependence: Secondary | ICD-10-CM

## 2018-03-24 LAB — COMPREHENSIVE METABOLIC PANEL
ALK PHOS: 57 U/L (ref 38–126)
ALT: 12 U/L (ref 0–44)
ANION GAP: 9 (ref 5–15)
AST: 18 U/L (ref 15–41)
Albumin: 3.7 g/dL (ref 3.5–5.0)
BILIRUBIN TOTAL: 0.7 mg/dL (ref 0.3–1.2)
BUN: 11 mg/dL (ref 8–23)
CALCIUM: 9.1 mg/dL (ref 8.9–10.3)
CO2: 27 mmol/L (ref 22–32)
Chloride: 102 mmol/L (ref 98–111)
Creatinine, Ser: 1.15 mg/dL (ref 0.61–1.24)
GFR calc non Af Amer: >60 mL/min (ref >60)
Glucose, Bld: 119 mg/dL — ABNORMAL HIGH (ref 70–99)
Potassium: 3.5 mmol/L (ref 3.5–5.1)
SODIUM: 138 mmol/L (ref 135–145)
TOTAL PROTEIN: 7.2 g/dL (ref 6.5–8.1)

## 2018-03-24 LAB — CBC WITH DIFFERENTIAL/PLATELET
BASOS PCT: 0 %
Basophils Absolute: 0 10*3/uL (ref 0–0.1)
Eosinophils Absolute: 0.1 10*3/uL (ref 0–0.7)
Eosinophils Relative: 1 %
HEMATOCRIT: 35.1 % — AB (ref 40.0–52.0)
HEMOGLOBIN: 12.3 g/dL — AB (ref 13.0–18.0)
Lymphocytes Relative: 15 %
Lymphs Abs: 0.7 10*3/uL — ABNORMAL LOW (ref 1.0–3.6)
MCH: 32.8 pg (ref 26.0–34.0)
MCHC: 35.1 g/dL (ref 32.0–36.0)
MCV: 93.3 fL (ref 80.0–100.0)
Monocytes Absolute: 0.7 10*3/uL (ref 0.2–1.0)
Monocytes Relative: 13 %
NEUTROS ABS: 3.6 10*3/uL (ref 1.4–6.5)
NEUTROS PCT: 71 %
Platelets: 290 10*3/uL (ref 150–440)
RBC: 3.76 MIL/uL — AB (ref 4.40–5.90)
RDW: 17.2 % — ABNORMAL HIGH (ref 11.5–14.5)
WBC: 5.1 10*3/uL (ref 3.8–10.6)

## 2018-03-24 MED ORDER — SODIUM CHLORIDE 0.9 % IV SOLN
1000.0000 mg | Freq: Once | INTRAVENOUS | Status: AC
  Administered 2018-03-24: 1000 mg via INTRAVENOUS
  Filled 2018-03-24: qty 20

## 2018-03-24 MED ORDER — SODIUM CHLORIDE 0.9 % IV SOLN
Freq: Once | INTRAVENOUS | Status: AC
  Administered 2018-03-24: 10:00:00 via INTRAVENOUS
  Filled 2018-03-24: qty 250

## 2018-03-24 MED ORDER — SODIUM CHLORIDE 0.9 % IV SOLN: 10.0000 mg/kg | Freq: Once | INTRAVENOUS | Status: DC

## 2018-03-24 MED ORDER — SODIUM CHLORIDE 0.9% FLUSH
10.0000 mL | Freq: Once | INTRAVENOUS | Status: AC
  Administered 2018-03-24: 10 mL via INTRAVENOUS
  Filled 2018-03-24: qty 10

## 2018-03-24 MED ORDER — HEPARIN SOD (PORK) LOCK FLUSH 100 UNIT/ML IV SOLN
500.0000 [IU] | Freq: Once | INTRAVENOUS | Status: AC
  Administered 2018-03-24: 500 [IU] via INTRAVENOUS

## 2018-03-24 NOTE — Telephone Encounter (Signed)
   Ref Range & Units 6d ago  Potassium 3.5 - 5.1 mmol/L 3.1Low

## 2018-03-24 NOTE — Progress Notes (Signed)
Radiation Oncology Follow up Note  Name: Mark Mccarty   Date:   03/24/2018 MRN:  [id number] DOB: [birth year]    This 67 y.o. male presents to the clinic today for one-month follow-up status post concurrent chemoradiation therapy.for stage IIIa (T3 N1 M0 adenocarcinoma the right lung.  REFERRING PROVIDER: [name], MD  HPI: patient is a 67 year old male now seen out 1 month having completed concurrent chemoradiation for stage IIIa adenocarcinoma the right upper lobe. He is seen today in routine follow-up is doing well cough is subsiding he has no hemoptysis. He states his breathing is fine has no significant dyspnea on exertion..he had a recent CT scan showing decreased size in both hilar adenopathy as well as the primary lesion in the right upper lobe. He is currently starting durvalumab immunotherapy.  COMPLICATIONS OF TREATMENT: none  FOLLOW UP COMPLIANCE: keeps appointments   PHYSICAL EXAM:  BP (!) 148/73 (BP Location: Left Arm, Patient Position: Sitting)   Pulse 86   Temp (!) 97.2 F (36.2 C) (Tympanic)   Resp 16   Wt 211 lb 8.5 oz (95.9 kg)   BMI 28.69 kg/m  Well-developed well-nourished patient in NAD. HEENT reveals PERLA, EOMI, discs not visualized.  Oral cavity is clear. No oral mucosal lesions are identified. Neck is clear without evidence of cervical or supraclavicular adenopathy. Lungs are clear to A&P. Cardiac examination is essentially unremarkable with regular rate and rhythm without murmur rub or thrill. Abdomen is benign with no organomegaly or masses noted. Motor sensory and DTR levels are equal and symmetric in the upper and lower extremities. Cranial nerves II through XII are grossly intact. Proprioception is intact. No peripheral adenopathy or edema is identified. No motor or sensory levels are noted. Crude visual fields are within normal range.  RADIOLOGY RESULTS: CT scan reviewed and compatible above-stated findings  PLAN: at the present time he is doing  well with excellent shrinkage even this early on from concurrent treatment. He continues on immunotherapy under medical oncology's direction. I have asked to see him back in 3-4 months for follow-up and expect to obtain a CT scan around that time. Patient is to call sooner with any concerns.  I would like to take this opportunity to thank you for allowing me to participate in the care of your patient.[name], MD

## 2018-03-24 NOTE — Progress Notes (Signed)
Hematology/Oncology Follow up note [facility] Telephone:(336) [phone] [phone]) [phone]   Patient Care Team: [name], MD as PCP - General (Internal Medicine) [name], RN as Registered Nurse   REASON FOR VISIT Follow up for treatment of Stage III lung adenocarcinoma  Oncology History:  Mark Mccarty is a  67 y.o.  male with  adenocarcinoma of lung, diagnosed via biopsy of right hilar lymph nodes.  Former 43 pack year smoking history.  Presents for assessment prior to concurrent chemo T3 N1 M0  INTERVAL HISTORY Mark Mccarty is a 67 y.o. male who has above history reviewed by me presents for assessment prior to starting maintenance immunotherapy with durvalumab.  During the interval he has had baseline CT chest done.  And presents to discuss image results. Chronic cough, "on and off" worst in the morning when he gets up.  Nonproductive.  Not associate with fever or chills.  Shortness of breath is baseline.  Review of Systems  Constitutional: Positive for malaise/fatigue. Negative for chills, fever and weight loss.  [name]: Negative for congestion, ear discharge, ear pain, nosebleeds, sinus pain and sore throat.   Eyes: Negative for double vision, photophobia, pain, discharge and redness.  Respiratory: Positive for shortness of breath. Negative for cough, hemoptysis, sputum production and wheezing.   Cardiovascular: Negative for chest pain, palpitations, orthopnea, claudication and leg swelling.  Gastrointestinal: Negative for abdominal pain, blood in stool, constipation, diarrhea, heartburn, melena, nausea and vomiting.  Genitourinary: Negative for dysuria, flank pain, frequency and hematuria.  Musculoskeletal: Negative for back pain, myalgias and neck pain.  Skin: Negative for itching and rash.  Neurological: Negative for dizziness, tingling, tremors, focal weakness, weakness and headaches.  Endo/Heme/Allergies: Negative for environmental  allergies. Does not bruise/bleed easily.  Psychiatric/Behavioral: Negative for depression and hallucinations. The patient is not nervous/anxious and does not have insomnia.     MEDICAL HISTORY:  Past Medical History:  Diagnosis Date  . Asthma   . Cancer ([place])    lung  . Cataract    left cataract surgery  . Dyspnea   . [name]   . GERD (gastroesophageal reflux disease)   . Hypertension   . Neuromuscular disorder ([place])    pt has knees bilateral with tendon issues that cause pain    SURGICAL HISTORY: Past Surgical History:  Procedure Laterality Date  . CARDIAC CATHETERIZATION N/A 12/16/2014   Procedure: Left Heart Cath;  Surgeon: [name], MD;  Location: [place] [facility];  Service: Cardiovascular;  Laterality: N/A;  . CYST REMOVAL TRUNK     chest and back over time and it was removed  . ENDOBRONCHIAL ULTRASOUND N/A 12/09/2017   Procedure: ENDOBRONCHIAL ULTRASOUND;  Surgeon: [name], MD;  Location: [name];  Service: Pulmonary;  Laterality: N/A;  . left cataract surgery    . PORTA CATH INSERTION N/A 12/23/2017   Procedure: PORTA CATH INSERTION;  Surgeon: [name], MD;  Location: [place] [facility];  Service: Cardiovascular;  Laterality: N/A;    SOCIAL HISTORY: Social History   Socioeconomic History  . Marital status: Divorced    Spouse name: Not on file  . Number of children: Not on file  . Years of education: Not on file  . Highest education level: Not on file  Occupational History  . Not on file  Social Needs  . Financial resource strain: Not on file  . Food insecurity:    Worry: Not on file    Inability: Not on file  .  Transportation needs:    Medical: Not on file    Non-medical: Not on file  Tobacco Use  . Smoking status: Former Smoker    Packs/day: 1.50    Years: 35.00    Pack years: 52.50    Types: Cigarettes    Last attempt to quit: 06/22/2015    Years since quitting: 2.7  . Smokeless tobacco: Never Used  Substance  and Sexual Activity  . Alcohol use: Yes    Comment: a fifth in a week  . Drug use: No  . Sexual activity: Not Currently  Lifestyle  . Physical activity:    Days per week: Not on file    Minutes per session: Not on file  . Stress: Not on file  Relationships  . Social connections:    Talks on phone: Not on file    Gets together: Not on file    Attends religious service: Not on file    Active member of club or organization: Not on file    Attends meetings of clubs or organizations: Not on file    Relationship status: Not on file  . Intimate partner violence:    Fear of current or ex partner: Not on file    Emotionally abused: Not on file    Physically abused: Not on file    Forced sexual activity: Not on file  Other Topics Concern  . Not on file  Social History Narrative  . Not on file    FAMILY HISTORY: Family History  Problem Relation Age of Onset  . Hypertension Mother   . Breast cancer Mother   . Heart attack Mother   . Lung cancer Father   . Heart disease Sister   . Diabetes Sister   . Colon cancer Sister     ALLERGIES:  is allergic to penicillins.  MEDICATIONS:  Current Outpatient Medications  Medication Sig Dispense Refill  . acetaminophen (TYLENOL) 500 MG tablet Take 500 mg by mouth as needed.    [name] albuterol (PROVENTIL HFA;VENTOLIN HFA) 108 (90 Base) MCG/ACT inhaler Inhale 2 puffs into the lungs every 6 (six) hours as needed.  12  . amLODipine (NORVASC) 10 MG tablet Take 10 mg by mouth daily.    Jearl Klinefelter ELLIPTA 62.5-25 MCG/INH AEPB TAKE 1 PUFF BY MOUTH EVERY DAY 180 each 3  . aspirin 81 MG tablet Take 81 mg by mouth daily.    . benzonatate (TESSALON) 200 MG capsule TAKE 1 CAPSULE BY MOUTH EVERY DAY 3 TIMES A DAY AS NEEDED FOR COUGH  0  . carvedilol (COREG) 25 MG tablet Take 25 mg by mouth 2 (two) times daily with a meal.    . docusate sodium (COLACE) 100 MG capsule Take 1 capsule (100 mg total) by mouth daily as needed for mild constipation. Do not take if  you have loose stools 30 capsule 3  . doxycycline (VIBRAMYCIN) 100 MG capsule Take 100 mg by mouth 2 (two) times daily.    . fluticasone (FLONASE) 50 MCG/ACT nasal spray PLACE 1 SPRAY INTO BOTH NOSTRILS 2 (TWO) TIMES DAILY  0  . gabapentin (NEURONTIN) 300 MG capsule Take 300 mg by mouth at bedtime as needed.     [name] ibuprofen (ADVIL,MOTRIN) 600 MG tablet Take 600 mg by mouth every 6 (six) hours as needed.    [name] levofloxacin (LEVAQUIN) 250 MG tablet Take 250 mg by mouth daily.    [name] lidocaine-prilocaine (EMLA) cream Apply to affected area once 30 g 3  . lisinopril (PRINIVIL,ZESTRIL)  40 MG tablet Take 40 mg by mouth daily.    [name] omeprazole (PRILOSEC) 20 MG capsule Take 20 mg by mouth 2 (two) times daily before a meal.    . ondansetron (ZOFRAN) 8 MG tablet Take 1 tablet (8 mg total) by mouth 2 (two) times daily as needed for refractory nausea / vomiting. 30 tablet 1  . prochlorperazine (COMPAZINE) 10 MG tablet Take 1 tablet (10 mg total) by mouth every 6 (six) hours as needed (Nausea or vomiting). 30 tablet 1  . tamsulosin (FLOMAX) 0.4 MG CAPS capsule Take 0.4 mg by mouth daily.    [name] zolpidem (AMBIEN) 5 MG tablet Take 1 tablet (5 mg total) by mouth at bedtime as needed for sleep. 30 tablet 0  . KLOR-CON M20 20 MEQ tablet TAKE 1 TABLET BY MOUTH EVERY DAY 30 tablet 1   No current facility-administered medications for this visit.    Facility-Administered Medications Ordered in Other Visits  Medication Dose Route Frequency Provider Last Rate Last Dose  . sodium chloride flush (NS) 0.9 % injection 10 mL  10 mL Intravenous PRN [name], MD   10 mL at 03/18/18 0858     PHYSICAL EXAMINATION: ECOG PERFORMANCE STATUS: 0 - Asymptomatic Vitals:   03/24/18 0946  BP: 135/74  Pulse: 79  Resp: 16  Temp: (!) 97 F (36.1 C)  SpO2: 96%   Filed Weights   03/24/18 0946  Weight: 210 lb 6 oz (95.4 kg)    Physical Exam  Constitutional: He is oriented to person, place, and time. No distress.  [name]:  Head:  Normocephalic and atraumatic.  Mouth/Throat: Oropharynx is clear and moist.  Eyes: Pupils are equal, round, and reactive to light. Conjunctivae and EOM are normal. No scleral icterus.  Neck: Normal range of motion. Neck supple.  Cardiovascular: Normal rate, regular rhythm and normal heart sounds.  Pulmonary/Chest: Effort normal. No respiratory distress. He has no wheezes. He has no rales. He exhibits no tenderness.  Decreased breath sounds bilaterally  Abdominal: Soft. Bowel sounds are normal. He exhibits no distension and no mass. There is no tenderness.  Musculoskeletal: Normal range of motion. He exhibits no edema or deformity.  Lymphadenopathy:    He has no cervical adenopathy.  Neurological: He is alert and oriented to person, place, and time. No cranial nerve deficit. Coordination normal.  Skin: Skin is warm and dry. No rash noted. No erythema.  Psychiatric: He has a normal mood and affect. His behavior is normal. Thought content normal.     LABORATORY DATA:  I have reviewed the data as listed Lab Results  Component Value Date   WBC 5.1 03/24/2018   HGB 12.3 (L) 03/24/2018   HCT 35.1 (L) 03/24/2018   MCV 93.3 03/24/2018   PLT 290 03/24/2018   Recent Labs    02/19/18 1025 03/18/18 0858 03/24/18 0925  NA 134* 140 138  K 3.5 3.1* 3.5  CL 101 102 102  CO2 27 29 27   GLUCOSE 126* 153* 119*  BUN 16 9 11   CREATININE 1.14 0.85 1.15  CALCIUM 8.7* 9.2 9.1  GFRNONAA >60 >60 >60  GFRAA >60 >60 >60  PROT 6.6 7.1 7.2  ALBUMIN 3.6 3.6 3.7  AST 17 21 18   ALT 19 13 12   ALKPHOS 56 57 57  BILITOT 0.6 0.8 0.7       ASSESSMENT & PLAN:  1. Primary lung adenocarcinoma, right ([place])   2. Cough   3. Encounter for antineoplastic immunotherapy   4. Anemia,  unspecified type   T3 N1 M0 lung adenocarcinoma Status post concurrent chemoradiation. #CT chest was independently reviewed by me and discussed with patient.  There has been mild decreasing size of the right upper lobe  pulmonary nodule.  Interval decrease in size of right paratracheal and right hilar adenopathy. Labs were reviewed and discussed with patient.  Counts acceptable to start with cycle 1 durvalumab immunotherapy. He will be assessed every 2 weeks prior to his maintenance durvalumab.  Close monitoring signs of immunotherapy toxicities.  #Hypokalemia, potassium 3.5 recommend patient continue taking potassium supplements  # Anemia, stable.  Hemoglobin 12.3.   All questions were answered. The patient knows to call the clinic with any problems questions or concerns.  Return of visit: 2 weeks Total face to face encounter time for this patient visit was 25 min. >50% of the time was  spent in counseling and coordination of care.  [name], MD, PhD Hematology Oncology [facility] Pager- [phone] 03/24/2018

## 2018-03-24 NOTE — Progress Notes (Signed)
Patient here today for follow up.  Patient states no new concerns today  

## 2018-04-02 ENCOUNTER — Other Ambulatory Visit: Payer: Self-pay

## 2018-04-02 ENCOUNTER — Ambulatory Visit: Payer: Medicare Other | Admitting: Urology

## 2018-04-02 ENCOUNTER — Encounter: Payer: Self-pay | Admitting: Urology

## 2018-04-02 VITALS — BP 129/67 | HR 81 | Ht 72.0 in | Wt 211.5 lb

## 2018-04-02 DIAGNOSIS — R972 Elevated prostate specific antigen [PSA]: Secondary | ICD-10-CM | POA: Diagnosis not present

## 2018-04-02 LAB — URINALYSIS, COMPLETE
BILIRUBIN UA: NEGATIVE
Glucose, UA: NEGATIVE
Ketones, UA: NEGATIVE
Leukocytes, UA: NEGATIVE
NITRITE UA: NEGATIVE
PH UA: 5.5 (ref 5.0–7.5)
PROTEIN UA: NEGATIVE
Specific Gravity, UA: 1.025 (ref 1.005–1.030)
UUROB: 1 mg/dL (ref 0.2–1.0)

## 2018-04-02 LAB — MICROSCOPIC EXAMINATION
BACTERIA UA: NONE SEEN
EPITHELIAL CELLS (NON RENAL): NONE SEEN /HPF (ref 0–10)
WBC UA: NONE SEEN /HPF (ref 0–5)

## 2018-04-02 NOTE — Progress Notes (Signed)
04/02/2018 1:51 PM   Mark Mccarty 26-Jul-1950 [id number]  Referring provider: [name], MD [facility] [place],  [address]  CC: Elevated PSA  HPI: I had the pleasure of seeing Mark Mccarty in urology clinic today in consultation for elevated PSA from Dr. Raechel Ache.  He is a 67 year old African-American male with no family history of prostate cancer, who was recently diagnosed in May 2019 with stage III lung cancer and underwent chemoradiation this summer.  His PSA was 2.42 in 08/2016, and on repeat 03/2018 was 7.84.  He has mild lower urinary tract symptoms including nocturia 1-2 times per night, feeling of incomplete emptying, and occasional weak stream.  He denies history of UTI.  He was recently started on Flomax which he feels improves his symptoms.  He denies gross hematuria, weight loss, back pain.  There are no aggravating or alleviating factors.  Severity is moderate.  Duration is since September 2018.   PMH: Past Medical History:  Diagnosis Date  . Asthma   . Cancer ([place])    lung  . Cataract    left cataract surgery  . Dyspnea   . [name]   . GERD (gastroesophageal reflux disease)   . Hypertension   . Neuromuscular disorder ([place])    pt has knees bilateral with tendon issues that cause pain    Surgical History: Past Surgical History:  Procedure Laterality Date  . CARDIAC CATHETERIZATION N/A 12/16/2014   Procedure: Left Heart Cath;  Surgeon: [name], MD;  Location: [facility];  Service: Cardiovascular;  Laterality: N/A;  . CYST REMOVAL TRUNK     chest and back over time and it was removed  . ENDOBRONCHIAL ULTRASOUND N/A 12/09/2017   Procedure: ENDOBRONCHIAL ULTRASOUND;  Surgeon: [name], MD;  Location: [name];  Service: Pulmonary;  Laterality: N/A;  . left cataract surgery    . PORTA CATH INSERTION N/A 12/23/2017   Procedure: PORTA CATH INSERTION;  Surgeon: [name], MD;  Location:  [facility];  Service: Cardiovascular;  Laterality: N/A;    Allergies:  Allergies  Allergen Reactions  . Penicillins Rash    Stomach hurt Has patient had a PCN reaction causing immediate rash, facial/tongue/throat swelling, SOB or lightheadedness with hypotension: yes Has patient had a PCN reaction causing severe rash involving mucus membranes or skin necrosis: no Has patient had a PCN reaction that required hospitalization: no Has patient had a PCN reaction occurring within the last 10 years: yes If all of the above answers are "NO", then may proceed with Cephalosporin use.     Family History: Family History  Problem Relation Age of Onset  . Hypertension Mother   . Breast cancer Mother   . Heart attack Mother   . Lung cancer Father   . Heart disease Sister   . Diabetes Sister   . Colon cancer Sister     Social History:  reports that he quit smoking about 2 years ago. His smoking use included cigarettes. He has a 52.50 pack-year smoking history. He has never used smokeless tobacco. He reports that he drinks alcohol. He reports that he does not use drugs.  ROS: Please see flowsheet from today's date for complete review of systems.  Physical Exam: BP 129/67   Pulse 81   Ht 6' (1.829 m)   Wt 211 lb 8 oz (95.9 kg)   BMI 28.68 kg/m    Constitutional:  Alert and oriented, No acute distress. Cardiovascular: No  clubbing, cyanosis, or edema. Respiratory: Normal respiratory effort, no increased work of breathing. GI: Abdomen is soft, nontender, nondistended, no abdominal masses GU: No CVA tenderness DRE: Limited secondary to body habitus, no obvious nodules noted at apex. Lymph: No cervical or inguinal lymphadenopathy. Skin: No rashes, bruises or suspicious lesions. Neurologic: Grossly intact, no focal deficits, moving all 4 extremities. Psychiatric: Normal mood and affect.  Laboratory Data: PSA 08/2016: 2.42 03/2018: 7.24  Urinalysis today 0 WBCs, 0-2 RBCs, no  epithelial cells, no bacteria, nitrite negative  Pertinent Imaging: None to review  Assessment & Plan:   In summary, Mr. Blume is a 67 year old male with stage III lung cancer undergoing chemoradiation found to have an elevated PSA to 7.84 from 2.4 18 months ago.  He denies a family history of prostate cancer.  We reviewed the implications of an elevated PSA and the uncertainty surrounding it. In general, a man's PSA increases with age and is produced by both normal and cancerous prostate tissue. The differential diagnosis for elevated PSA includes BPH, prostate cancer, infection, recent intercourse/ejaculation, recent urethroscopic manipulation (foley placement/cystoscopy) or trauma, and prostatitis.   Management of an elevated PSA can include observation or prostate biopsy and we discussed this in detail. Our goal is to detect clinically significant prostate cancers, and manage with either active surveillance, surgery, or radiation for localized disease. Risks of prostate biopsy include bleeding, infection (including life threatening sepsis), pain, and lower urinary symptoms. Hematuria, hematospermia, and blood in the stool are all common after biopsy and can persist up to 4 weeks.   With his lung cancer, I strongly advised repeat PSA with free/total ratio prior to considering biopsy. I will call him with these results, and we will consider biopsy if persistently elevated   Billey Co, MD  [facility] [address], [place], [place] [address] [phone]

## 2018-04-03 ENCOUNTER — Telehealth: Payer: Self-pay | Admitting: Urology

## 2018-04-03 LAB — PSA, TOTAL AND FREE
PROSTATE SPECIFIC AG, SERUM: 3.2 ng/mL (ref 0.0–4.0)
PSA FREE PCT: 15.9 %
PSA, Free: 0.51 ng/mL

## 2018-04-03 NOTE — Telephone Encounter (Signed)
Patient notified

## 2018-04-03 NOTE — Telephone Encounter (Signed)
-----   Message from Billey Co, MD sent at 04/03/2018  7:46 AM EDT ----- Please let him know his repeat PSA was normal at 3.2. RTC one year with PSA prior.  Thanks [name], MD 04/03/2018

## 2018-04-03 NOTE — Addendum Note (Signed)
Addended by: Billey Co on: 04/03/2018 07:50 AM   Modules accepted: Orders

## 2018-04-03 NOTE — Telephone Encounter (Signed)
I have already made his follow up app and mailed it to him   [place]

## 2018-04-08 ENCOUNTER — Inpatient Hospital Stay: Payer: Medicare Other

## 2018-04-08 ENCOUNTER — Encounter: Payer: Self-pay | Admitting: Oncology

## 2018-04-08 ENCOUNTER — Inpatient Hospital Stay: Payer: Medicare Other | Attending: Oncology

## 2018-04-08 ENCOUNTER — Inpatient Hospital Stay (HOSPITAL_BASED_OUTPATIENT_CLINIC_OR_DEPARTMENT_OTHER): Payer: Medicare Other | Admitting: Oncology

## 2018-04-08 VITALS — BP 146/81 | HR 73 | Temp 97.3°F | Resp 18

## 2018-04-08 VITALS — BP 139/74 | HR 71 | Temp 96.5°F | Resp 18 | Wt 210.0 lb

## 2018-04-08 DIAGNOSIS — D649 Anemia, unspecified: Secondary | ICD-10-CM

## 2018-04-08 DIAGNOSIS — R05 Cough: Secondary | ICD-10-CM | POA: Insufficient documentation

## 2018-04-08 DIAGNOSIS — Z5112 Encounter for antineoplastic immunotherapy: Secondary | ICD-10-CM | POA: Insufficient documentation

## 2018-04-08 DIAGNOSIS — Z87891 Personal history of nicotine dependence: Secondary | ICD-10-CM | POA: Insufficient documentation

## 2018-04-08 DIAGNOSIS — C3491 Malignant neoplasm of unspecified part of right bronchus or lung: Secondary | ICD-10-CM | POA: Diagnosis present

## 2018-04-08 DIAGNOSIS — Z79899 Other long term (current) drug therapy: Secondary | ICD-10-CM | POA: Insufficient documentation

## 2018-04-08 LAB — CBC WITH DIFFERENTIAL/PLATELET
BASOS ABS: 0 10*3/uL (ref 0–0.1)
Basophils Relative: 0 %
EOS ABS: 0 10*3/uL (ref 0–0.7)
EOS PCT: 1 %
HCT: 36.2 % — ABNORMAL LOW (ref 40.0–52.0)
Hemoglobin: 12.8 g/dL — ABNORMAL LOW (ref 13.0–18.0)
Lymphocytes Relative: 17 %
Lymphs Abs: 0.9 10*3/uL — ABNORMAL LOW (ref 1.0–3.6)
MCH: 33 pg (ref 26.0–34.0)
MCHC: 35.4 g/dL (ref 32.0–36.0)
MCV: 93.2 fL (ref 80.0–100.0)
Monocytes Absolute: 0.6 10*3/uL (ref 0.2–1.0)
Monocytes Relative: 12 %
Neutro Abs: 3.8 10*3/uL (ref 1.4–6.5)
Neutrophils Relative %: 70 %
PLATELETS: 295 10*3/uL (ref 150–440)
RBC: 3.88 MIL/uL — AB (ref 4.40–5.90)
RDW: 16.7 % — AB (ref 11.5–14.5)
WBC: 5.4 10*3/uL (ref 3.8–10.6)

## 2018-04-08 LAB — COMPREHENSIVE METABOLIC PANEL
ALT: 12 U/L (ref 0–44)
AST: 18 U/L (ref 15–41)
Albumin: 3.8 g/dL (ref 3.5–5.0)
Alkaline Phosphatase: 56 U/L (ref 38–126)
Anion gap: 9 (ref 5–15)
BILIRUBIN TOTAL: 0.7 mg/dL (ref 0.3–1.2)
BUN: 13 mg/dL (ref 8–23)
CO2: 27 mmol/L (ref 22–32)
CREATININE: 0.85 mg/dL (ref 0.61–1.24)
Calcium: 9.2 mg/dL (ref 8.9–10.3)
Chloride: 102 mmol/L (ref 98–111)
GFR calc Af Amer: >60 mL/min (ref >60)
Glucose, Bld: 117 mg/dL — ABNORMAL HIGH (ref 70–99)
Potassium: 3.5 mmol/L (ref 3.5–5.1)
Sodium: 138 mmol/L (ref 135–145)
TOTAL PROTEIN: 7.2 g/dL (ref 6.5–8.1)

## 2018-04-08 MED ORDER — SODIUM CHLORIDE 0.9 % IV SOLN
Freq: Once | INTRAVENOUS | Status: AC
  Administered 2018-04-08: 10:00:00 via INTRAVENOUS
  Filled 2018-04-08: qty 250

## 2018-04-08 MED ORDER — SODIUM CHLORIDE 0.9 % IV SOLN
10.0000 mg/kg | Freq: Once | INTRAVENOUS | Status: AC
  Administered 2018-04-08: 960 mg via INTRAVENOUS
  Filled 2018-04-08: qty 10

## 2018-04-08 MED ORDER — HEPARIN SOD (PORK) LOCK FLUSH 100 UNIT/ML IV SOLN
500.0000 [IU] | Freq: Once | INTRAVENOUS | Status: AC
  Administered 2018-04-08: 500 [IU] via INTRAVENOUS
  Filled 2018-04-08: qty 5

## 2018-04-08 MED ORDER — MUPIROCIN 2 % EX OINT: 1.0000 "application " | TOPICAL_OINTMENT | Freq: Three times a day (TID) | CUTANEOUS | 0 refills | Status: DC

## 2018-04-08 MED ORDER — SODIUM CHLORIDE 0.9% FLUSH
10.0000 mL | INTRAVENOUS | Status: DC | PRN
  Administered 2018-04-08: 10 mL via INTRAVENOUS
  Filled 2018-04-08: qty 10

## 2018-04-08 NOTE — Progress Notes (Signed)
Hematology/Oncology Follow up note [facility] [phone] [phone]) [phone]   Patient Care Team: [name], MD as PCP - General (Internal Medicine) [name], RN as Registered Nurse   REASON FOR VISIT Follow up for treatment of Stage III lung adenocarcinoma  Oncology History:  Mark Mccarty is a  67 y.o.  male with  adenocarcinoma of lung, diagnosed via biopsy of right hilar lymph nodes.  Former 43 pack year smoking history.  Presents for assessment prior to concurrent chemo T3 N1 M0  INTERVAL HISTORY Mark Mccarty is a 67 y.o. male who has above history reviewed by me presents for assessment prior to immunotherapy with Durvalumab.   # reports doing well. Chronic cough at baseline  Denies any rash, diarrhea.   Review of Systems  Constitutional: Positive for malaise/fatigue. Negative for chills, fever and weight loss.  [name]: Negative for congestion, ear discharge, ear pain, nosebleeds, sinus pain and sore throat.   Eyes: Negative for double vision, photophobia, pain, discharge and redness.  Respiratory: Positive for shortness of breath. Negative for cough, hemoptysis, sputum production and wheezing.   Cardiovascular: Negative for chest pain, palpitations, orthopnea, claudication and leg swelling.  Gastrointestinal: Negative for abdominal pain, blood in stool, constipation, diarrhea, heartburn, melena, nausea and vomiting.  Genitourinary: Negative for dysuria, flank pain, frequency and hematuria.  Musculoskeletal: Negative for back pain, myalgias and neck pain.  Skin: Negative for itching and rash.  Neurological: Negative for dizziness, tingling, tremors, focal weakness, weakness and headaches.  Endo/Heme/Allergies: Negative for environmental allergies. Does not bruise/bleed easily.  Psychiatric/Behavioral: Negative for depression and hallucinations. The patient is not nervous/anxious and does not have insomnia.     MEDICAL  HISTORY:  Past Medical History:  Diagnosis Date  . Asthma   . Cancer ([place])    lung  . Cataract    left cataract surgery  . Dyspnea   . [name]   . GERD (gastroesophageal reflux disease)   . Hypertension   . Neuromuscular disorder ([place])    pt has knees bilateral with tendon issues that cause pain    SURGICAL HISTORY: Past Surgical History:  Procedure Laterality Date  . CARDIAC CATHETERIZATION N/A 12/16/2014   Procedure: Left Heart Cath;  Surgeon: [name], MD;  Location: [facility];  Service: Cardiovascular;  Laterality: N/A;  . CYST REMOVAL TRUNK     chest and back over time and it was removed  . ENDOBRONCHIAL ULTRASOUND N/A 12/09/2017   Procedure: ENDOBRONCHIAL ULTRASOUND;  Surgeon: [name], MD;  Location: [name];  Service: Pulmonary;  Laterality: N/A;  . left cataract surgery    . PORTA CATH INSERTION N/A 12/23/2017   Procedure: PORTA CATH INSERTION;  Surgeon: [name], MD;  Location: [facility];  Service: Cardiovascular;  Laterality: N/A;    SOCIAL HISTORY: Social History   Socioeconomic History  . Marital status: Divorced    Spouse name: Not on file  . Number of children: Not on file  . Years of education: Not on file  . Highest education level: Not on file  Occupational History  . Not on file  Social Needs  . Financial resource strain: Not on file  . Food insecurity:    Worry: Not on file    Inability: Not on file  . Transportation needs:    Medical: Not on file    Non-medical: Not on file  Tobacco Use  . Smoking status: Former Smoker    Packs/day: 1.50  Years: 35.00    Pack years: 52.50    Types: Cigarettes    Last attempt to quit: 06/22/2015    Years since quitting: 2.7  . Smokeless tobacco: Never Used  Substance and Sexual Activity  . Alcohol use: Yes    Comment: a fifth in a week  . Drug use: No  . Sexual activity: Not Currently  Lifestyle  . Physical activity:    Days per week: Not on file     Minutes per session: Not on file  . Stress: Not on file  Relationships  . Social connections:    Talks on phone: Not on file    Gets together: Not on file    Attends religious service: Not on file    Active member of club or organization: Not on file    Attends meetings of clubs or organizations: Not on file    Relationship status: Not on file  . Intimate partner violence:    Fear of current or ex partner: Not on file    Emotionally abused: Not on file    Physically abused: Not on file    Forced sexual activity: Not on file  Other Topics Concern  . Not on file  Social History Narrative  . Not on file    FAMILY HISTORY: Family History  Problem Relation Age of Onset  . Hypertension Mother   . Breast cancer Mother   . Heart attack Mother   . Lung cancer Father   . Heart disease Sister   . Diabetes Sister   . Colon cancer Sister     ALLERGIES:  is allergic to penicillins.  MEDICATIONS:  Current Outpatient Medications  Medication Sig Dispense Refill  . acetaminophen (TYLENOL) 500 MG tablet Take 500 mg by mouth as needed.    [name] albuterol (PROVENTIL HFA;VENTOLIN HFA) 108 (90 Base) MCG/ACT inhaler Inhale 2 puffs into the lungs every 6 (six) hours as needed.  12  . amLODipine (NORVASC) 10 MG tablet Take 10 mg by mouth daily.    Jearl Klinefelter ELLIPTA 62.5-25 MCG/INH AEPB TAKE 1 PUFF BY MOUTH EVERY DAY 180 each 3  . aspirin 81 MG tablet Take 81 mg by mouth daily.    . benzonatate (TESSALON) 200 MG capsule TAKE 1 CAPSULE BY MOUTH EVERY DAY 3 TIMES A DAY AS NEEDED FOR COUGH  0  . carvedilol (COREG) 25 MG tablet Take 25 mg by mouth 2 (two) times daily with a meal.    . docusate sodium (COLACE) 100 MG capsule Take 1 capsule (100 mg total) by mouth daily as needed for mild constipation. Do not take if you have loose stools 30 capsule 3  . doxycycline (VIBRAMYCIN) 100 MG capsule Take 100 mg by mouth 2 (two) times daily.    . fluticasone (FLONASE) 50 MCG/ACT nasal spray PLACE 1 SPRAY INTO  BOTH NOSTRILS 2 (TWO) TIMES DAILY  0  . gabapentin (NEURONTIN) 300 MG capsule Take 300 mg by mouth at bedtime as needed.     [name] ibuprofen (ADVIL,MOTRIN) 600 MG tablet Take 600 mg by mouth every 6 (six) hours as needed.    [name] KLOR-CON M20 20 MEQ tablet TAKE 1 TABLET BY MOUTH EVERY DAY 30 tablet 1  . levofloxacin (LEVAQUIN) 250 MG tablet Take 250 mg by mouth daily.    [name] lidocaine-prilocaine (EMLA) cream Apply to affected area once 30 g 3  . lisinopril (PRINIVIL,ZESTRIL) 40 MG tablet Take 40 mg by mouth daily.    . mupirocin ointment (BACTROBAN)  2 % Place 1 application into the nose 3 (three) times daily. 15 g 0  . omeprazole (PRILOSEC) 20 MG capsule Take 20 mg by mouth 2 (two) times daily before a meal.    . ondansetron (ZOFRAN) 8 MG tablet Take 1 tablet (8 mg total) by mouth 2 (two) times daily as needed for refractory nausea / vomiting. 30 tablet 1  . prochlorperazine (COMPAZINE) 10 MG tablet Take 1 tablet (10 mg total) by mouth every 6 (six) hours as needed (Nausea or vomiting). 30 tablet 1  . tamsulosin (FLOMAX) 0.4 MG CAPS capsule Take 0.4 mg by mouth daily.    [name] zolpidem (AMBIEN) 5 MG tablet Take 1 tablet (5 mg total) by mouth at bedtime as needed for sleep. 30 tablet 0   No current facility-administered medications for this visit.    Facility-Administered Medications Ordered in Other Visits  Medication Dose Route Frequency Provider Last Rate Last Dose  . sodium chloride flush (NS) 0.9 % injection 10 mL  10 mL Intravenous PRN [name], MD   10 mL at 03/18/18 0858     PHYSICAL EXAMINATION: ECOG PERFORMANCE STATUS: 0 - Asymptomatic Vitals:   04/08/18 1056  BP: 139/74  Pulse: 71  Resp: 18  Temp: (!) 96.5 F (35.8 C)  SpO2: 97%   Filed Weights   04/08/18 1056  Weight: 210 lb (95.3 kg)    Physical Exam  Constitutional: He is oriented to person, place, and time. No distress.  [name]:  Head: Normocephalic and atraumatic.  Mouth/Throat: Oropharynx is clear and moist.  Eyes: Pupils  are equal, round, and reactive to light. Conjunctivae and EOM are normal. No scleral icterus.  Neck: Normal range of motion. Neck supple.  Cardiovascular: Normal rate, regular rhythm and normal heart sounds.  Pulmonary/Chest: Effort normal. No respiratory distress. He has no wheezes. He has no rales. He exhibits no tenderness.  Decreased breath sounds bilaterally  Abdominal: Soft. Bowel sounds are normal. He exhibits no distension and no mass. There is no tenderness.  Musculoskeletal: Normal range of motion. He exhibits no edema or deformity.  Lymphadenopathy:    He has no cervical adenopathy.  Neurological: He is alert and oriented to person, place, and time. No cranial nerve deficit. Coordination normal.  Skin: Skin is warm and dry. No rash noted. No erythema.  Psychiatric: He has a normal mood and affect. His behavior is normal. Thought content normal.     LABORATORY DATA:  I have reviewed the data as listed Lab Results  Component Value Date   WBC 5.4 04/08/2018   HGB 12.8 (L) 04/08/2018   HCT 36.2 (L) 04/08/2018   MCV 93.2 04/08/2018   PLT 295 04/08/2018   Recent Labs    03/18/18 0858 03/24/18 0925 04/08/18 0851  NA 140 138 138  K 3.1* 3.5 3.5  CL 102 102 102  CO2 29 27 27   GLUCOSE 153* 119* 117*  BUN 9 11 13   CREATININE 0.85 1.15 0.85  CALCIUM 9.2 9.1 9.2  GFRNONAA >60 >60 >60  GFRAA >60 >60 >60  PROT 7.1 7.2 7.2  ALBUMIN 3.6 3.7 3.8  AST 21 18 18   ALT 13 12 12   ALKPHOS 57 57 56  BILITOT 0.8 0.7 0.7   RADIOGRAPHIC STUDIES: I have personally reviewed the radiological images as listed and agreed with the findings in the report. 03/21/2018 CT chest showed mild decreasing size of the right upper lobe pulmonary nodule.  Interval decrease in size of right paratracheal and right hilar  adenopathy.    ASSESSMENT & PLAN:  1. Primary lung adenocarcinoma, right ([place])   2. Encounter for antineoplastic immunotherapy   T3 N1 M0 lung adenocarcinoma Status post concurrent  chemoradiation. Labs reviewed and discussed with patient.  Counts are acceptable to start with cycle 2 Durvalumab immunotherapy. He will be assessed every 2 weeks prior to his maintenance durvalumab for close monitoring signs of immunotherapy toxicities. # Anemia, stable.  Hemoglobin continue to improve.  Today's hemoglobin was 12.8.  The patient knows to call the clinic with any problems questions or concerns.  Return of visit: 2 weeks Total face to face encounter time for this patient visit was 25  min. >50% of the time was  spent in counseling and coordination of care.   [name], MD, PhD Hematology Oncology [facility] at [facility] Pager- [phone] 04/08/2018

## 2018-04-22 ENCOUNTER — Inpatient Hospital Stay (HOSPITAL_BASED_OUTPATIENT_CLINIC_OR_DEPARTMENT_OTHER): Payer: Medicare Other | Admitting: Oncology

## 2018-04-22 ENCOUNTER — Inpatient Hospital Stay: Payer: Medicare Other

## 2018-04-22 ENCOUNTER — Other Ambulatory Visit: Payer: Self-pay

## 2018-04-22 ENCOUNTER — Encounter: Payer: Self-pay | Admitting: Oncology

## 2018-04-22 VITALS — BP 146/82 | HR 80 | Temp 96.8°F | Resp 18 | Wt 210.4 lb

## 2018-04-22 DIAGNOSIS — D649 Anemia, unspecified: Secondary | ICD-10-CM | POA: Diagnosis not present

## 2018-04-22 DIAGNOSIS — Z5112 Encounter for antineoplastic immunotherapy: Secondary | ICD-10-CM | POA: Diagnosis not present

## 2018-04-22 DIAGNOSIS — C3491 Malignant neoplasm of unspecified part of right bronchus or lung: Secondary | ICD-10-CM | POA: Diagnosis not present

## 2018-04-22 DIAGNOSIS — R0602 Shortness of breath: Secondary | ICD-10-CM

## 2018-04-22 DIAGNOSIS — R05 Cough: Secondary | ICD-10-CM

## 2018-04-22 DIAGNOSIS — Z87891 Personal history of nicotine dependence: Secondary | ICD-10-CM

## 2018-04-22 LAB — COMPREHENSIVE METABOLIC PANEL
ALT: 12 U/L (ref 0–44)
AST: 19 U/L (ref 15–41)
Albumin: 3.8 g/dL (ref 3.5–5.0)
Alkaline Phosphatase: 61 U/L (ref 38–126)
Anion gap: 7 (ref 5–15)
BUN: 11 mg/dL (ref 8–23)
CHLORIDE: 105 mmol/L (ref 98–111)
CO2: 28 mmol/L (ref 22–32)
Calcium: 9.4 mg/dL (ref 8.9–10.3)
Creatinine, Ser: 0.8 mg/dL (ref 0.61–1.24)
GFR calc Af Amer: >60 mL/min (ref >60)
Glucose, Bld: 128 mg/dL — ABNORMAL HIGH (ref 70–99)
POTASSIUM: 3.3 mmol/L — AB (ref 3.5–5.1)
SODIUM: 140 mmol/L (ref 135–145)
Total Bilirubin: 0.5 mg/dL (ref 0.3–1.2)
Total Protein: 7.3 g/dL (ref 6.5–8.1)

## 2018-04-22 LAB — CBC WITH DIFFERENTIAL/PLATELET
Abs Immature Granulocytes: 0.02 10*3/uL (ref 0.00–0.07)
BASOS ABS: 0 10*3/uL (ref 0.0–0.1)
Basophils Relative: 0 %
Eosinophils Absolute: 0.1 10*3/uL (ref 0.0–0.5)
Eosinophils Relative: 1 %
HCT: 37.7 % — ABNORMAL LOW (ref 39.0–52.0)
HEMOGLOBIN: 12.6 g/dL — AB (ref 13.0–17.0)
Immature Granulocytes: 0 %
LYMPHS PCT: 12 %
Lymphs Abs: 0.8 10*3/uL (ref 0.7–4.0)
MCH: 30.1 pg (ref 26.0–34.0)
MCHC: 33.4 g/dL (ref 30.0–36.0)
MCV: 90.2 fL (ref 80.0–100.0)
Monocytes Absolute: 0.7 10*3/uL (ref 0.1–1.0)
Monocytes Relative: 11 %
Neutro Abs: 4.7 10*3/uL (ref 1.7–7.7)
Neutrophils Relative %: 76 %
Platelets: 284 10*3/uL (ref 150–400)
RBC: 4.18 MIL/uL — AB (ref 4.22–5.81)
RDW: 13.9 % (ref 11.5–15.5)
WBC: 6.3 10*3/uL (ref 4.0–10.5)
nRBC: 0 % (ref 0.0–0.2)

## 2018-04-22 LAB — TSH: TSH: 1.809 u[IU]/mL (ref 0.350–4.500)

## 2018-04-22 MED ORDER — SENNA 8.6 MG PO TABS: 2.0000 | ORAL_TABLET | Freq: Every day | ORAL | 0 refills | Status: DC

## 2018-04-22 MED ORDER — HEPARIN SOD (PORK) LOCK FLUSH 100 UNIT/ML IV SOLN: 500.0000 [IU] | Freq: Once | INTRAVENOUS | Status: DC

## 2018-04-22 MED ORDER — SODIUM CHLORIDE 0.9% FLUSH
10.0000 mL | INTRAVENOUS | Status: DC | PRN
  Administered 2018-04-22: 10 mL via INTRAVENOUS
  Filled 2018-04-22: qty 10

## 2018-04-22 MED ORDER — HEPARIN SOD (PORK) LOCK FLUSH 100 UNIT/ML IV SOLN
500.0000 [IU] | Freq: Once | INTRAVENOUS | Status: AC | PRN
  Administered 2018-04-22: 500 [IU]

## 2018-04-22 MED ORDER — SODIUM CHLORIDE 0.9 % IV SOLN
10.0000 mg/kg | Freq: Once | INTRAVENOUS | Status: AC
  Administered 2018-04-22: 960 mg via INTRAVENOUS
  Filled 2018-04-22: qty 10

## 2018-04-22 MED ORDER — SODIUM CHLORIDE 0.9 % IV SOLN
Freq: Once | INTRAVENOUS | Status: AC
  Administered 2018-04-22: 10:00:00 via INTRAVENOUS
  Filled 2018-04-22: qty 250

## 2018-04-22 NOTE — Progress Notes (Signed)
Hematology/Oncology Follow up note [facility] [phone] [phone]) [phone]   Patient Care Team: [name], MD as PCP - General (Internal Medicine) [name], RN as Registered Nurse   REASON FOR VISIT Follow up for treatment of Stage III lung adenocarcinoma  Oncology History:  Mark Mccarty is a  67 y.o.  male with  adenocarcinoma of lung, diagnosed via biopsy of right hilar lymph nodes.  Former 43 pack year smoking history.  Presents for assessment prior to concurrent chemo T3 N1 M0  INTERVAL HISTORY Mark Mccarty is a 67 y.o. male who has above history reviewed by me presents for assessment prior to immunotherapy with Durvalumab.   # Reports tolerating immunotherapy well. Denies any skin rash, diarrhea, abdominal pain, SOB more than his baseline.  Chronic cough is at baseline.  Review of Systems  Constitutional: Positive for malaise/fatigue. Negative for chills, fever and weight loss.  [name]: Negative for congestion, ear discharge, ear pain, nosebleeds, sinus pain and sore throat.   Eyes: Negative for double vision, photophobia, pain, discharge and redness.  Respiratory: Positive for shortness of breath. Negative for cough, hemoptysis, sputum production and wheezing.   Cardiovascular: Negative for chest pain, palpitations, orthopnea, claudication and leg swelling.  Gastrointestinal: Negative for abdominal pain, blood in stool, constipation, diarrhea, heartburn, melena, nausea and vomiting.  Genitourinary: Negative for dysuria, flank pain, frequency and hematuria.  Musculoskeletal: Negative for back pain, myalgias and neck pain.  Skin: Negative for itching and rash.  Neurological: Negative for dizziness, tingling, tremors, focal weakness, weakness and headaches.  Endo/Heme/Allergies: Negative for environmental allergies. Does not bruise/bleed easily.  Psychiatric/Behavioral: Negative for depression and hallucinations. The patient  is not nervous/anxious and does not have insomnia.     MEDICAL HISTORY:  Past Medical History:  Diagnosis Date  . Asthma   . Cancer ([place])    lung  . Cataract    left cataract surgery  . Dyspnea   . [name]   . GERD (gastroesophageal reflux disease)   . Hypertension   . Neuromuscular disorder ([place])    pt has knees bilateral with tendon issues that cause pain    SURGICAL HISTORY: Past Surgical History:  Procedure Laterality Date  . CARDIAC CATHETERIZATION N/A 12/16/2014   Procedure: Left Heart Cath;  Surgeon: [name], MD;  Location: [place] [facility];  Service: Cardiovascular;  Laterality: N/A;  . CYST REMOVAL TRUNK     chest and back over time and it was removed  . ENDOBRONCHIAL ULTRASOUND N/A 12/09/2017   Procedure: ENDOBRONCHIAL ULTRASOUND;  Surgeon: [name], MD;  Location: [name];  Service: Pulmonary;  Laterality: N/A;  . left cataract surgery    . PORTA CATH INSERTION N/A 12/23/2017   Procedure: PORTA CATH INSERTION;  Surgeon: [name], MD;  Location: [place] [facility];  Service: Cardiovascular;  Laterality: N/A;    SOCIAL HISTORY: Social History   Socioeconomic History  . Marital status: Divorced    Spouse name: Not on file  . Number of children: Not on file  . Years of education: Not on file  . Highest education level: Not on file  Occupational History  . Not on file  Social Needs  . Financial resource strain: Not on file  . Food insecurity:    Worry: Not on file    Inability: Not on file  . Transportation needs:    Medical: Not on file    Non-medical: Not on file  Tobacco Use  . Smoking status:  Former Smoker    Packs/day: 1.50    Years: 35.00    Pack years: 52.50    Types: Cigarettes    Last attempt to quit: 06/22/2015    Years since quitting: 2.8  . Smokeless tobacco: Never Used  Substance and Sexual Activity  . Alcohol use: Yes    Comment: a fifth in a week  . Drug use: No  . Sexual activity: Not Currently   Lifestyle  . Physical activity:    Days per week: Not on file    Minutes per session: Not on file  . Stress: Not on file  Relationships  . Social connections:    Talks on phone: Not on file    Gets together: Not on file    Attends religious service: Not on file    Active member of club or organization: Not on file    Attends meetings of clubs or organizations: Not on file    Relationship status: Not on file  . Intimate partner violence:    Fear of current or ex partner: Not on file    Emotionally abused: Not on file    Physically abused: Not on file    Forced sexual activity: Not on file  Other Topics Concern  . Not on file  Social History Narrative  . Not on file    FAMILY HISTORY: Family History  Problem Relation Age of Onset  . Hypertension Mother   . Breast cancer Mother   . Heart attack Mother   . Lung cancer Father   . Heart disease Sister   . Diabetes Sister   . Colon cancer Sister     ALLERGIES:  is allergic to penicillins.  MEDICATIONS:  Current Outpatient Medications  Medication Sig Dispense Refill  . acetaminophen (TYLENOL) 500 MG tablet Take 500 mg by mouth as needed.    [name] albuterol (PROVENTIL HFA;VENTOLIN HFA) 108 (90 Base) MCG/ACT inhaler Inhale 2 puffs into the lungs every 6 (six) hours as needed.  12  . amLODipine (NORVASC) 10 MG tablet Take 10 mg by mouth daily.    Jearl Klinefelter ELLIPTA 62.5-25 MCG/INH AEPB TAKE 1 PUFF BY MOUTH EVERY DAY 180 each 3  . aspirin 81 MG tablet Take 81 mg by mouth daily.    . carvedilol (COREG) 25 MG tablet Take 25 mg by mouth 2 (two) times daily with a meal.    . fluticasone (FLONASE) 50 MCG/ACT nasal spray PLACE 1 SPRAY INTO BOTH NOSTRILS 2 (TWO) TIMES DAILY  0  . gabapentin (NEURONTIN) 300 MG capsule Take 300 mg by mouth at bedtime as needed.     [name] ibuprofen (ADVIL,MOTRIN) 600 MG tablet Take 600 mg by mouth every 6 (six) hours as needed.    [name] KLOR-CON M20 20 MEQ tablet TAKE 1 TABLET BY MOUTH EVERY DAY 30 tablet 1  .  lidocaine-prilocaine (EMLA) cream Apply to affected area once 30 g 3  . lisinopril (PRINIVIL,ZESTRIL) 40 MG tablet Take 40 mg by mouth daily.    . mupirocin ointment (BACTROBAN) 2 % Place 1 application into the nose 3 (three) times daily. 15 g 0  . ondansetron (ZOFRAN) 8 MG tablet Take 1 tablet (8 mg total) by mouth 2 (two) times daily as needed for refractory nausea / vomiting. 30 tablet 1  . prochlorperazine (COMPAZINE) 10 MG tablet Take 1 tablet (10 mg total) by mouth every 6 (six) hours as needed (Nausea or vomiting). 30 tablet 1  . benzonatate (TESSALON) 200 MG capsule TAKE 1  CAPSULE BY MOUTH EVERY DAY 3 TIMES A DAY AS NEEDED FOR COUGH  0  . doxycycline (VIBRAMYCIN) 100 MG capsule Take 100 mg by mouth 2 (two) times daily.    [name] levofloxacin (LEVAQUIN) 250 MG tablet Take 250 mg by mouth daily.    [name] omeprazole (PRILOSEC) 20 MG capsule Take 20 mg by mouth 2 (two) times daily before a meal.    . senna (SENOKOT) 8.6 MG TABS tablet Take 2 tablets (17.2 mg total) by mouth daily. 120 each 0  . tamsulosin (FLOMAX) 0.4 MG CAPS capsule Take 0.4 mg by mouth daily.    [name] zolpidem (AMBIEN) 5 MG tablet Take 1 tablet (5 mg total) by mouth at bedtime as needed for sleep. (Patient not taking: Reported on 04/22/2018) 30 tablet 0   No current facility-administered medications for this visit.    Facility-Administered Medications Ordered in Other Visits  Medication Dose Route Frequency Provider Last Rate Last Dose  . sodium chloride flush (NS) 0.9 % injection 10 mL  10 mL Intravenous PRN [name], MD   10 mL at 03/18/18 0858     PHYSICAL EXAMINATION: ECOG PERFORMANCE STATUS: 0 - Asymptomatic Vitals:   04/22/18 0911  BP: (!) 146/82  Pulse: 80  Resp: 18  Temp: (!) 96.8 F (36 C)  SpO2: 96%   Filed Weights   04/22/18 0911  Weight: 210 lb 6.4 oz (95.4 kg)    Physical Exam  Constitutional: He is oriented to person, place, and time. No distress.  [name]:  Head: Normocephalic and atraumatic.    Mouth/Throat: Oropharynx is clear and moist.  Eyes: Pupils are equal, round, and reactive to light. Conjunctivae and EOM are normal. No scleral icterus.  Neck: Normal range of motion. Neck supple.  Cardiovascular: Normal rate, regular rhythm and normal heart sounds.  Pulmonary/Chest: Effort normal. No respiratory distress. He has no wheezes. He has no rales. He exhibits no tenderness.  Decreased breath sounds bilaterally  Abdominal: Soft. Bowel sounds are normal. He exhibits no distension and no mass. There is no tenderness.  Musculoskeletal: Normal range of motion. He exhibits no edema or deformity.  Lymphadenopathy:    He has no cervical adenopathy.  Neurological: He is alert and oriented to person, place, and time. No cranial nerve deficit. Coordination normal.  Skin: Skin is warm and dry. No rash noted. No erythema.  Psychiatric: He has a normal mood and affect. His behavior is normal. Thought content normal.     LABORATORY DATA:  I have reviewed the data as listed Lab Results  Component Value Date   WBC 6.3 04/22/2018   HGB 12.6 (L) 04/22/2018   HCT 37.7 (L) 04/22/2018   MCV 90.2 04/22/2018   PLT 284 04/22/2018   Recent Labs    03/24/18 0925 04/08/18 0851 04/22/18 0854  NA 138 138 140  K 3.5 3.5 3.3*  CL 102 102 105  CO2 27 27 28   GLUCOSE 119* 117* 128*  BUN 11 13 11   CREATININE 1.15 0.85 0.80  CALCIUM 9.1 9.2 9.4  GFRNONAA >60 >60 >60  GFRAA >60 >60 >60  PROT 7.2 7.2 7.3  ALBUMIN 3.7 3.8 3.8  AST 18 18 19   ALT 12 12 12   ALKPHOS 57 56 61  BILITOT 0.7 0.7 0.5   RADIOGRAPHIC STUDIES: I have personally reviewed the radiological images as listed and agreed with the findings in the report. 03/21/2018 CT chest showed mild decreasing size of the right upper lobe pulmonary nodule.  Interval decrease in  size of right paratracheal and right hilar adenopathy.    ASSESSMENT & PLAN:  1. Primary lung adenocarcinoma, right ([place])   2. Encounter for antineoplastic  immunotherapy   3. Anemia, unspecified type   T3 N1 M0 lung adenocarcinoma, s/p concurrent chemoradiation.  Labs reviewed and discussed with patient.  Counts are acceptable to start with today' s maintenance Durvalumab immunotherapy. Plan total of 26 cycles of Durvalumab treatment [1 year] if tolerates.  TSH has been stable.   # Chronic anemia stable.  The patient knows to call the clinic with any problems questions or concerns.  Return of visit: 2 weeks.  Total face to face encounter time for this patient visit was 25 min. >50% of the time was  spent in counseling and coordination of care.  [name], MD, PhD Hematology Oncology [facility] at [facility] Pager- [phone] 04/22/2018

## 2018-04-22 NOTE — Progress Notes (Signed)
Patient here for follow up. Pt has a boil to back right shoulder that has been drained but it is still sore.

## 2018-05-06 ENCOUNTER — Inpatient Hospital Stay: Payer: Medicare Other

## 2018-05-06 ENCOUNTER — Other Ambulatory Visit: Payer: Self-pay

## 2018-05-06 ENCOUNTER — Inpatient Hospital Stay (HOSPITAL_BASED_OUTPATIENT_CLINIC_OR_DEPARTMENT_OTHER): Payer: Medicare Other | Admitting: Oncology

## 2018-05-06 ENCOUNTER — Inpatient Hospital Stay: Payer: Medicare Other | Attending: Oncology

## 2018-05-06 ENCOUNTER — Encounter: Payer: Self-pay | Admitting: Oncology

## 2018-05-06 VITALS — BP 131/71 | HR 78 | Temp 96.8°F | Resp 18 | Wt 213.2 lb

## 2018-05-06 DIAGNOSIS — D649 Anemia, unspecified: Secondary | ICD-10-CM | POA: Insufficient documentation

## 2018-05-06 DIAGNOSIS — Z5112 Encounter for antineoplastic immunotherapy: Secondary | ICD-10-CM | POA: Insufficient documentation

## 2018-05-06 DIAGNOSIS — Z79899 Other long term (current) drug therapy: Secondary | ICD-10-CM | POA: Insufficient documentation

## 2018-05-06 DIAGNOSIS — R200 Anesthesia of skin: Secondary | ICD-10-CM

## 2018-05-06 DIAGNOSIS — Z87891 Personal history of nicotine dependence: Secondary | ICD-10-CM | POA: Insufficient documentation

## 2018-05-06 DIAGNOSIS — C3491 Malignant neoplasm of unspecified part of right bronchus or lung: Secondary | ICD-10-CM | POA: Diagnosis present

## 2018-05-06 DIAGNOSIS — R0602 Shortness of breath: Secondary | ICD-10-CM | POA: Diagnosis not present

## 2018-05-06 DIAGNOSIS — G629 Polyneuropathy, unspecified: Secondary | ICD-10-CM

## 2018-05-06 DIAGNOSIS — R05 Cough: Secondary | ICD-10-CM

## 2018-05-06 DIAGNOSIS — G4700 Insomnia, unspecified: Secondary | ICD-10-CM

## 2018-05-06 LAB — COMPREHENSIVE METABOLIC PANEL
ALBUMIN: 3.6 g/dL (ref 3.5–5.0)
ALK PHOS: 54 U/L (ref 38–126)
ALT: 11 U/L (ref 0–44)
ANION GAP: 8 (ref 5–15)
AST: 17 U/L (ref 15–41)
BILIRUBIN TOTAL: 0.5 mg/dL (ref 0.3–1.2)
BUN: 9 mg/dL (ref 8–23)
CALCIUM: 8.9 mg/dL (ref 8.9–10.3)
CO2: 28 mmol/L (ref 22–32)
Chloride: 102 mmol/L (ref 98–111)
Creatinine, Ser: 0.8 mg/dL (ref 0.61–1.24)
GFR calc non Af Amer: >60 mL/min (ref >60)
Glucose, Bld: 159 mg/dL — ABNORMAL HIGH (ref 70–99)
POTASSIUM: 3.5 mmol/L (ref 3.5–5.1)
Sodium: 138 mmol/L (ref 135–145)
TOTAL PROTEIN: 6.9 g/dL (ref 6.5–8.1)

## 2018-05-06 LAB — CBC WITH DIFFERENTIAL/PLATELET
ABS IMMATURE GRANULOCYTES: 0.03 10*3/uL (ref 0.00–0.07)
BASOS ABS: 0 10*3/uL (ref 0.0–0.1)
BASOS PCT: 0 %
EOS ABS: 0.1 10*3/uL (ref 0.0–0.5)
Eosinophils Relative: 2 %
HCT: 38.9 % — ABNORMAL LOW (ref 39.0–52.0)
Hemoglobin: 12.9 g/dL — ABNORMAL LOW (ref 13.0–17.0)
IMMATURE GRANULOCYTES: 1 %
Lymphocytes Relative: 12 %
Lymphs Abs: 0.8 10*3/uL (ref 0.7–4.0)
MCH: 30.7 pg (ref 26.0–34.0)
MCHC: 33.2 g/dL (ref 30.0–36.0)
MCV: 92.6 fL (ref 80.0–100.0)
Monocytes Absolute: 0.7 10*3/uL (ref 0.1–1.0)
Monocytes Relative: 11 %
NEUTROS ABS: 4.8 10*3/uL (ref 1.7–7.7)
NEUTROS PCT: 74 %
NRBC: 0 % (ref 0.0–0.2)
Platelets: 264 10*3/uL (ref 150–400)
RBC: 4.2 MIL/uL — AB (ref 4.22–5.81)
RDW: 13.9 % (ref 11.5–15.5)
WBC: 6.4 10*3/uL (ref 4.0–10.5)

## 2018-05-06 MED ORDER — SODIUM CHLORIDE 0.9 % IV SOLN
Freq: Once | INTRAVENOUS | Status: AC
  Administered 2018-05-06: 10:00:00 via INTRAVENOUS
  Filled 2018-05-06: qty 250

## 2018-05-06 MED ORDER — HEPARIN SOD (PORK) LOCK FLUSH 100 UNIT/ML IV SOLN
500.0000 [IU] | Freq: Once | INTRAVENOUS | Status: AC
  Administered 2018-05-06: 500 [IU] via INTRAVENOUS
  Filled 2018-05-06: qty 5

## 2018-05-06 MED ORDER — SODIUM CHLORIDE 0.9 % IV SOLN
10.0000 mg/kg | Freq: Once | INTRAVENOUS | Status: AC
  Administered 2018-05-06: 960 mg via INTRAVENOUS
  Filled 2018-05-06: qty 10

## 2018-05-06 MED ORDER — SODIUM CHLORIDE 0.9% FLUSH
10.0000 mL | Freq: Once | INTRAVENOUS | Status: AC
  Administered 2018-05-06: 10 mL via INTRAVENOUS
  Filled 2018-05-06: qty 10

## 2018-05-06 NOTE — Progress Notes (Signed)
Patient here for follow. Pt complaining of numbing and tingling to hands and feet. Pain is worse of left leg and radiates to left hip.

## 2018-05-07 NOTE — Progress Notes (Signed)
Hematology/Oncology Follow up note [facility] Telephone:([phone] Fax:(336) [phone]   Patient Care Team: Mark Kayser, MD as PCP - General (Internal Medicine) [name], RN as Registered Nurse   REASON FOR VISIT Follow up for treatment of Stage III lung adenocarcinoma  Oncology History:  Mark Mccarty is a  67 y.o.  male with  adenocarcinoma of lung, diagnosed via biopsy of right hilar lymph nodes.  Former 43 pack year smoking history.  Presents for assessment prior to concurrent chemo T3 N1 M0  INTERVAL HISTORY Mark Mccarty is a 67 y.o. male who has above history reviewed by me presents for assessment prior to immunotherapy with Durvalumab.   Reports tolerating immunotherapy well.  Denies any skin rash, diarrhea, abdominal pain, shortness of breath more than baseline. Chronic cough is at baseline.  No wheezing. Patient complains hands and feet numbing and tingling. Patient has a history of insomnia and he has a prescription of gabapentin 300 mg QHS.  Patient reports taking 600 mg before sleep as needed.   Review of Systems  Constitutional: Positive for malaise/fatigue. Negative for chills, fever and weight loss.  [name]: Negative for congestion, ear discharge, ear pain, nosebleeds, sinus pain and sore throat.   Eyes: Negative for double vision, photophobia, pain, discharge and redness.  Respiratory: Positive for shortness of breath. Negative for cough, hemoptysis, sputum production and wheezing.   Cardiovascular: Negative for chest pain, palpitations, orthopnea, claudication and leg swelling.  Gastrointestinal: Negative for abdominal pain, blood in stool, constipation, diarrhea, heartburn, melena, nausea and vomiting.  Genitourinary: Negative for dysuria, flank pain, frequency and hematuria.  Musculoskeletal: Negative for back pain, myalgias and neck pain.  Skin: Negative for itching and rash.  Neurological: Positive for tingling. Negative  for dizziness, tremors, focal weakness, weakness and headaches.  Endo/Heme/Allergies: Negative for environmental allergies. Does not bruise/bleed easily.  Psychiatric/Behavioral: Negative for depression and hallucinations. The patient is not nervous/anxious and does not have insomnia.     MEDICAL HISTORY:  Past Medical History:  Diagnosis Date  . Asthma   . Cancer ([place])    lung  . Cataract    left cataract surgery  . Dyspnea   . [name]   . GERD (gastroesophageal reflux disease)   . Hypertension   . Neuromuscular disorder ([place])    pt has knees bilateral with tendon issues that cause pain    SURGICAL HISTORY: Past Surgical History:  Procedure Laterality Date  . CARDIAC CATHETERIZATION N/A 12/16/2014   Procedure: Left Heart Cath;  Surgeon: [name], MD;  Location: [place] [facility];  Service: Cardiovascular;  Laterality: N/A;  . CYST REMOVAL TRUNK     chest and back over time and it was removed  . ENDOBRONCHIAL ULTRASOUND N/A 12/09/2017   Procedure: ENDOBRONCHIAL ULTRASOUND;  Surgeon: [name], MD;  Location: [name];  Service: Pulmonary;  Laterality: N/A;  . left cataract surgery    . PORTA CATH INSERTION N/A 12/23/2017   Procedure: PORTA CATH INSERTION;  Surgeon: [name], MD;  Location: [place] [facility];  Service: Cardiovascular;  Laterality: N/A;    SOCIAL HISTORY: Social History   Socioeconomic History  . Marital status: Divorced    Spouse name: Not on file  . Number of children: Not on file  . Years of education: Not on file  . Highest education level: Not on file  Occupational History  . Not on file  Social Needs  . Financial resource strain: Not on file  . Food  insecurity:    Worry: Not on file    Inability: Not on file  . Transportation needs:    Medical: Not on file    Non-medical: Not on file  Tobacco Use  . Smoking status: Former Smoker    Packs/day: 1.50    Years: 35.00    Pack years: 52.50    Types: Cigarettes     Last attempt to quit: 06/22/2015    Years since quitting: 2.8  . Smokeless tobacco: Never Used  Substance and Sexual Activity  . Alcohol use: Yes    Comment: a fifth in a week  . Drug use: No  . Sexual activity: Not Currently  Lifestyle  . Physical activity:    Days per week: Not on file    Minutes per session: Not on file  . Stress: Not on file  Relationships  . Social connections:    Talks on phone: Not on file    Gets together: Not on file    Attends religious service: Not on file    Active member of club or organization: Not on file    Attends meetings of clubs or organizations: Not on file    Relationship status: Not on file  . Intimate partner violence:    Fear of current or ex partner: Not on file    Emotionally abused: Not on file    Physically abused: Not on file    Forced sexual activity: Not on file  Other Topics Concern  . Not on file  Social History Narrative  . Not on file    FAMILY HISTORY: Family History  Problem Relation Age of Onset  . Hypertension Mother   . Breast cancer Mother   . Heart attack Mother   . Lung cancer Father   . Heart disease Sister   . Diabetes Sister   . Colon cancer Sister     ALLERGIES:  is allergic to penicillins.  MEDICATIONS:  Current Outpatient Medications  Medication Sig Dispense Refill  . acetaminophen (TYLENOL) 500 MG tablet Take 500 mg by mouth as needed.    [name] albuterol (PROVENTIL HFA;VENTOLIN HFA) 108 (90 Base) MCG/ACT inhaler Inhale 2 puffs into the lungs every 6 (six) hours as needed.  12  . amLODipine (NORVASC) 10 MG tablet Take 10 mg by mouth daily.    Jearl Klinefelter ELLIPTA 62.5-25 MCG/INH AEPB TAKE 1 PUFF BY MOUTH EVERY DAY 180 each 3  . aspirin 81 MG tablet Take 81 mg by mouth daily.    . benzonatate (TESSALON) 200 MG capsule TAKE 1 CAPSULE BY MOUTH EVERY DAY 3 TIMES A DAY AS NEEDED FOR COUGH  0  . carvedilol (COREG) 25 MG tablet Take 25 mg by mouth 2 (two) times daily with a meal.    . fluticasone (FLONASE)  50 MCG/ACT nasal spray PLACE 1 SPRAY INTO BOTH NOSTRILS 2 (TWO) TIMES DAILY  0  . gabapentin (NEURONTIN) 300 MG capsule Take 300 mg by mouth at bedtime as needed.     [name] ibuprofen (ADVIL,MOTRIN) 600 MG tablet Take 600 mg by mouth every 6 (six) hours as needed.    [name] KLOR-CON M20 20 MEQ tablet TAKE 1 TABLET BY MOUTH EVERY DAY 30 tablet 1  . lidocaine-prilocaine (EMLA) cream Apply to affected area once 30 g 3  . lisinopril (PRINIVIL,ZESTRIL) 40 MG tablet Take 40 mg by mouth daily.    . mupirocin ointment (BACTROBAN) 2 % Place 1 application into the nose 3 (three) times daily. 15 g 0  .  omeprazole (PRILOSEC) 20 MG capsule Take 20 mg by mouth 2 (two) times daily before a meal.    . ondansetron (ZOFRAN) 8 MG tablet Take 1 tablet (8 mg total) by mouth 2 (two) times daily as needed for refractory nausea / vomiting. 30 tablet 1  . prochlorperazine (COMPAZINE) 10 MG tablet Take 1 tablet (10 mg total) by mouth every 6 (six) hours as needed (Nausea or vomiting). 30 tablet 1  . senna (SENOKOT) 8.6 MG TABS tablet Take 2 tablets (17.2 mg total) by mouth daily. 120 each 0  . tamsulosin (FLOMAX) 0.4 MG CAPS capsule Take 0.4 mg by mouth daily.    [name] doxycycline (VIBRAMYCIN) 100 MG capsule Take 100 mg by mouth 2 (two) times daily.    [name] levofloxacin (LEVAQUIN) 250 MG tablet Take 250 mg by mouth daily.    [name] zolpidem (AMBIEN) 5 MG tablet Take 1 tablet (5 mg total) by mouth at bedtime as needed for sleep. (Patient not taking: Reported on 04/22/2018) 30 tablet 0   No current facility-administered medications for this visit.    Facility-Administered Medications Ordered in Other Visits  Medication Dose Route Frequency Provider Last Rate Last Dose  . sodium chloride flush (NS) 0.9 % injection 10 mL  10 mL Intravenous PRN [name], MD   10 mL at 03/18/18 0858     PHYSICAL EXAMINATION: ECOG PERFORMANCE STATUS: 0 - Asymptomatic Vitals:   05/06/18 0921  BP: 131/71  Pulse: 78  Resp: 18  Temp: (!) 96.8 F (36 C)    SpO2: 92%   Filed Weights   05/06/18 0921  Weight: 213 lb 3.2 oz (96.7 kg)    Physical Exam  Constitutional: He is oriented to person, place, and time. No distress.  [name]:  Head: Normocephalic and atraumatic.  Mouth/Throat: Oropharynx is clear and moist.  Eyes: Pupils are equal, round, and reactive to light. Conjunctivae and EOM are normal. No scleral icterus.  Neck: Normal range of motion. Neck supple.  Cardiovascular: Normal rate, regular rhythm and normal heart sounds.  Pulmonary/Chest: Effort normal. No respiratory distress. He has no wheezes. He has no rales. He exhibits no tenderness.  Decreased breath sounds bilaterally  Abdominal: Soft. Bowel sounds are normal. He exhibits no distension and no mass. There is no tenderness.  Musculoskeletal: Normal range of motion. He exhibits no edema or deformity.  Lymphadenopathy:    He has no cervical adenopathy.  Neurological: He is alert and oriented to person, place, and time. No cranial nerve deficit. Coordination normal.  Skin: Skin is warm and dry. No rash noted. No erythema.  Psychiatric: He has a normal mood and affect. His behavior is normal. Thought content normal.     LABORATORY DATA:  I have reviewed the data as listed Lab Results  Component Value Date   WBC 6.4 05/06/2018   HGB 12.9 (L) 05/06/2018   HCT 38.9 (L) 05/06/2018   MCV 92.6 05/06/2018   PLT 264 05/06/2018   Recent Labs    04/08/18 0851 04/22/18 0854 05/06/18 0904  NA 138 140 138  K 3.5 3.3* 3.5  CL 102 105 102  CO2 27 28 28   GLUCOSE 117* 128* 159*  BUN 13 11 9   CREATININE 0.85 0.80 0.80  CALCIUM 9.2 9.4 8.9  GFRNONAA >60 >60 >60  GFRAA >60 >60 >60  PROT 7.2 7.3 6.9  ALBUMIN 3.8 3.8 3.6  AST 18 19 17   ALT 12 12 11   ALKPHOS 56 61 54  BILITOT 0.7 0.5 0.5  RADIOGRAPHIC STUDIES: I have personally reviewed the radiological images as listed and agreed with the findings in the report. 03/21/2018 CT chest showed mild decreasing size of the right  upper lobe pulmonary nodule.  Interval decrease in size of right paratracheal and right hilar adenopathy.    ASSESSMENT & PLAN:  1. Primary lung adenocarcinoma, right ([place])   2. Encounter for antineoplastic immunotherapy   3. Neuropathy   4. Anemia, unspecified type   T3 N1 M0 lung adenocarcinoma, s/p concurrent chemoradiation.   #Labs reviewed and discussed with patient.  Counts acceptable to start today's maintenance durvalumab.  Plan total of 26 cycles of durvalumab TSH has been stable.  #Chronic anemia stable #Numbness of fingertips and feet, likely neuropathy.  He previously had tactile treatment which can contribute to his symptoms.  Although the late onset of neuropathy is atypical. I advise patient to start using 600mg  of gabapentin every day to see if symptom has any relief.   # Chronic anemia: hemoglobin stable.  Return of visit: 2 weeks.  Total face to face encounter time for this patient visit was 25 min. >50% of the time was  spent in counseling and coordination of care.   [name], MD, PhD Hematology Oncology [facility] at [facility] Pager- [phone] 05/07/2018

## 2018-05-21 ENCOUNTER — Inpatient Hospital Stay (HOSPITAL_BASED_OUTPATIENT_CLINIC_OR_DEPARTMENT_OTHER): Payer: Medicare Other | Admitting: Oncology

## 2018-05-21 ENCOUNTER — Other Ambulatory Visit: Payer: Self-pay | Admitting: Oncology

## 2018-05-21 ENCOUNTER — Other Ambulatory Visit: Payer: Self-pay

## 2018-05-21 ENCOUNTER — Inpatient Hospital Stay: Payer: Medicare Other

## 2018-05-21 ENCOUNTER — Encounter: Payer: Self-pay | Admitting: Oncology

## 2018-05-21 VITALS — BP 131/73 | HR 75 | Temp 96.4°F | Resp 18 | Wt 212.2 lb

## 2018-05-21 DIAGNOSIS — C3491 Malignant neoplasm of unspecified part of right bronchus or lung: Secondary | ICD-10-CM | POA: Diagnosis not present

## 2018-05-21 DIAGNOSIS — G629 Polyneuropathy, unspecified: Secondary | ICD-10-CM

## 2018-05-21 DIAGNOSIS — R200 Anesthesia of skin: Secondary | ICD-10-CM | POA: Diagnosis not present

## 2018-05-21 DIAGNOSIS — Z5112 Encounter for antineoplastic immunotherapy: Secondary | ICD-10-CM

## 2018-05-21 DIAGNOSIS — R05 Cough: Secondary | ICD-10-CM | POA: Diagnosis not present

## 2018-05-21 DIAGNOSIS — Z87891 Personal history of nicotine dependence: Secondary | ICD-10-CM

## 2018-05-21 LAB — COMPREHENSIVE METABOLIC PANEL
ALBUMIN: 3.7 g/dL (ref 3.5–5.0)
ALK PHOS: 61 U/L (ref 38–126)
ALT: 11 U/L (ref 0–44)
ANION GAP: 8 (ref 5–15)
AST: 19 U/L (ref 15–41)
BUN: 12 mg/dL (ref 8–23)
CHLORIDE: 103 mmol/L (ref 98–111)
CO2: 27 mmol/L (ref 22–32)
Calcium: 9.2 mg/dL (ref 8.9–10.3)
Creatinine, Ser: 0.85 mg/dL (ref 0.61–1.24)
GFR calc Af Amer: >60 mL/min (ref >60)
GFR calc non Af Amer: >60 mL/min (ref >60)
Glucose, Bld: 144 mg/dL — ABNORMAL HIGH (ref 70–99)
POTASSIUM: 3.6 mmol/L (ref 3.5–5.1)
SODIUM: 138 mmol/L (ref 135–145)
Total Bilirubin: 0.3 mg/dL (ref 0.3–1.2)
Total Protein: 7.2 g/dL (ref 6.5–8.1)

## 2018-05-21 LAB — CBC WITH DIFFERENTIAL/PLATELET
ABS IMMATURE GRANULOCYTES: 0.02 10*3/uL (ref 0.00–0.07)
BASOS ABS: 0 10*3/uL (ref 0.0–0.1)
Basophils Relative: 1 %
EOS ABS: 0.1 10*3/uL (ref 0.0–0.5)
Eosinophils Relative: 1 %
HEMATOCRIT: 41 % (ref 39.0–52.0)
Hemoglobin: 13.6 g/dL (ref 13.0–17.0)
IMMATURE GRANULOCYTES: 0 %
LYMPHS ABS: 0.7 10*3/uL (ref 0.7–4.0)
LYMPHS PCT: 12 %
MCH: 29.8 pg (ref 26.0–34.0)
MCHC: 33.2 g/dL (ref 30.0–36.0)
MCV: 89.7 fL (ref 80.0–100.0)
Monocytes Absolute: 0.7 10*3/uL (ref 0.1–1.0)
Monocytes Relative: 12 %
NEUTROS ABS: 4.4 10*3/uL (ref 1.7–7.7)
Neutrophils Relative %: 74 %
Platelets: 313 10*3/uL (ref 150–400)
RBC: 4.57 MIL/uL (ref 4.22–5.81)
RDW: 13.1 % (ref 11.5–15.5)
WBC: 6 10*3/uL (ref 4.0–10.5)
nRBC: 0 % (ref 0.0–0.2)

## 2018-05-21 LAB — TSH: TSH: 2.642 u[IU]/mL (ref 0.350–4.500)

## 2018-05-21 MED ORDER — HEPARIN SOD (PORK) LOCK FLUSH 100 UNIT/ML IV SOLN: 500.0000 [IU] | Freq: Once | INTRAVENOUS | Status: DC | PRN

## 2018-05-21 MED ORDER — SODIUM CHLORIDE 0.9 % IV SOLN
10.0000 mg/kg | Freq: Once | INTRAVENOUS | Status: AC
  Administered 2018-05-21: 960 mg via INTRAVENOUS
  Filled 2018-05-21: qty 10

## 2018-05-21 MED ORDER — SODIUM CHLORIDE 0.9 % IV SOLN
Freq: Once | INTRAVENOUS | Status: AC
  Administered 2018-05-21: 10:00:00 via INTRAVENOUS
  Filled 2018-05-21: qty 250

## 2018-05-21 MED ORDER — SODIUM CHLORIDE 0.9% FLUSH
10.0000 mL | Freq: Once | INTRAVENOUS | Status: AC
  Administered 2018-05-21: 10 mL via INTRAVENOUS
  Filled 2018-05-21: qty 10

## 2018-05-21 MED ORDER — HEPARIN SOD (PORK) LOCK FLUSH 100 UNIT/ML IV SOLN
500.0000 [IU] | Freq: Once | INTRAVENOUS | Status: AC
  Administered 2018-05-21: 500 [IU] via INTRAVENOUS

## 2018-05-21 NOTE — Progress Notes (Signed)
Hematology/Oncology Follow up note [facility] [phone] Fax:(336) [phone]   Patient Care Team: [name], MD as PCP - General (Internal Medicine) [name], RN as Registered Nurse   REASON FOR VISIT Follow up for treatment of Stage III lung adenocarcinoma  Oncology History:  Mark Mccarty is a  67 y.o.  male with  adenocarcinoma of lung, diagnosed via biopsy of right hilar lymph nodes.  Former 43 pack year smoking history.  Presents for assessment prior to concurrent chemo T3 N1 M0  INTERVAL HISTORY Mark Mccarty is a 67 y.o. male who has above history reviewed by me presents for assessment prior to immunotherapy with durvalumab for treatment of stage III non-small cell lung cancer.  Reports tolerating immunotherapy well.  Denies any skin rash, diarrhea, abdominal pain, shortness of breath more than baseline.  He has chronic cough which has not worsened. No wheezing. Reports that he has had influenza vaccination done a few days ago. Patient has mild hand and feet numbing and tingling.  Intermittent.  Taking gabapentin 600 mg nightly.  Symptoms stable. Reports left leg "gives up" and becomes number when he walks on treadmil at home.  Review of Systems  Constitutional: Negative for appetite change, chills, fatigue, fever and unexpected weight change.  [name]:   Negative for hearing loss and voice change.   Eyes: Negative for eye problems and icterus.  Respiratory: Negative for chest tightness, cough and shortness of breath.   Cardiovascular: Negative for chest pain and leg swelling.  Gastrointestinal: Negative for abdominal distention and abdominal pain.  Endocrine: Negative for hot flashes.  Genitourinary: Negative for difficulty urinating, dysuria and frequency.   Musculoskeletal: Negative for arthralgias.  Skin: Negative for itching and rash.  Neurological: Negative for light-headedness and numbness.  [name]: Negative  for adenopathy. Does not bruise/bleed easily.  Psychiatric/Behavioral: Negative for confusion.    MEDICAL HISTORY:  Past Medical History:  Diagnosis Date  . Asthma   . Cancer ([place])    lung  . Cataract    left cataract surgery  . Dyspnea   . [name]   . GERD (gastroesophageal reflux disease)   . Hypertension   . Neuromuscular disorder ([place])    pt has knees bilateral with tendon issues that cause pain    SURGICAL HISTORY: Past Surgical History:  Procedure Laterality Date  . CARDIAC CATHETERIZATION N/A 12/16/2014   Procedure: Left Heart Cath;  Surgeon: [name], MD;  Location: [place] [facility];  Service: Cardiovascular;  Laterality: N/A;  . CYST REMOVAL TRUNK     chest and back over time and it was removed  . ENDOBRONCHIAL ULTRASOUND N/A 12/09/2017   Procedure: ENDOBRONCHIAL ULTRASOUND;  Surgeon: [name], MD;  Location: [name];  Service: Pulmonary;  Laterality: N/A;  . left cataract surgery    . PORTA CATH INSERTION N/A 12/23/2017   Procedure: PORTA CATH INSERTION;  Surgeon: [name], MD;  Location: [place] [facility];  Service: Cardiovascular;  Laterality: N/A;    SOCIAL HISTORY: Social History   Socioeconomic History  . Marital status: Divorced    Spouse name: Not on file  . Number of children: Not on file  . Years of education: Not on file  . Highest education level: Not on file  Occupational History  . Not on file  Social Needs  . Financial resource strain: Not on file  . Food insecurity:    Worry: Not on file    Inability: Not on file  .  Transportation needs:    Medical: Not on file    Non-medical: Not on file  Tobacco Use  . Smoking status: Former Smoker    Packs/day: 1.50    Years: 35.00    Pack years: 52.50    Types: Cigarettes    Last attempt to quit: 06/22/2015    Years since quitting: 2.9  . Smokeless tobacco: Never Used  Substance and Sexual Activity  . Alcohol use: Yes    Comment: a fifth in a week  .  Drug use: No  . Sexual activity: Not Currently  Lifestyle  . Physical activity:    Days per week: Not on file    Minutes per session: Not on file  . Stress: Not on file  Relationships  . Social connections:    Talks on phone: Not on file    Gets together: Not on file    Attends religious service: Not on file    Active member of club or organization: Not on file    Attends meetings of clubs or organizations: Not on file    Relationship status: Not on file  . Intimate partner violence:    Fear of current or ex partner: Not on file    Emotionally abused: Not on file    Physically abused: Not on file    Forced sexual activity: Not on file  Other Topics Concern  . Not on file  Social History Narrative  . Not on file    FAMILY HISTORY: Family History  Problem Relation Age of Onset  . Hypertension Mother   . Breast cancer Mother   . Heart attack Mother   . Lung cancer Father   . Heart disease Sister   . Diabetes Sister   . Colon cancer Sister     ALLERGIES:  is allergic to penicillins.  MEDICATIONS:  Current Outpatient Medications  Medication Sig Dispense Refill  . acetaminophen (TYLENOL) 500 MG tablet Take 500 mg by mouth as needed.    [name] albuterol (PROVENTIL HFA;VENTOLIN HFA) 108 (90 Base) MCG/ACT inhaler Inhale 2 puffs into the lungs every 6 (six) hours as needed.  12  . amLODipine (NORVASC) 10 MG tablet Take 10 mg by mouth daily.    Jearl Klinefelter ELLIPTA 62.5-25 MCG/INH AEPB TAKE 1 PUFF BY MOUTH EVERY DAY 180 each 3  . aspirin 81 MG tablet Take 81 mg by mouth daily.    . benzonatate (TESSALON) 200 MG capsule TAKE 1 CAPSULE BY MOUTH EVERY DAY 3 TIMES A DAY AS NEEDED FOR COUGH  0  . carvedilol (COREG) 25 MG tablet Take 25 mg by mouth 2 (two) times daily with a meal.    . fluticasone (FLONASE) 50 MCG/ACT nasal spray PLACE 1 SPRAY INTO BOTH NOSTRILS 2 (TWO) TIMES DAILY  0  . gabapentin (NEURONTIN) 300 MG capsule Take 300 mg by mouth at bedtime as needed.     [name] ibuprofen  (ADVIL,MOTRIN) 600 MG tablet Take 600 mg by mouth every 6 (six) hours as needed.    [name] KLOR-CON M20 20 MEQ tablet TAKE 1 TABLET BY MOUTH EVERY DAY 30 tablet 1  . lidocaine-prilocaine (EMLA) cream Apply to affected area once 30 g 3  . lisinopril (PRINIVIL,ZESTRIL) 40 MG tablet Take 40 mg by mouth daily.    . mupirocin ointment (BACTROBAN) 2 % Place 1 application into the nose 3 (three) times daily. 15 g 0  . omeprazole (PRILOSEC) 20 MG capsule Take 20 mg by mouth 2 (two) times daily before a  meal.    . senna (SENOKOT) 8.6 MG TABS tablet Take 2 tablets (17.2 mg total) by mouth daily. 120 each 0  . tamsulosin (FLOMAX) 0.4 MG CAPS capsule Take 0.4 mg by mouth daily.    [name] doxycycline (VIBRAMYCIN) 100 MG capsule Take 100 mg by mouth 2 (two) times daily.    [name] levofloxacin (LEVAQUIN) 250 MG tablet Take 250 mg by mouth daily.    . ondansetron (ZOFRAN) 8 MG tablet Take 1 tablet (8 mg total) by mouth 2 (two) times daily as needed for refractory nausea / vomiting. (Patient not taking: Reported on 05/21/2018) 30 tablet 1  . prochlorperazine (COMPAZINE) 10 MG tablet Take 1 tablet (10 mg total) by mouth every 6 (six) hours as needed (Nausea or vomiting). (Patient not taking: Reported on 05/21/2018) 30 tablet 1  . zolpidem (AMBIEN) 5 MG tablet Take 1 tablet (5 mg total) by mouth at bedtime as needed for sleep. (Patient not taking: Reported on 04/22/2018) 30 tablet 0   No current facility-administered medications for this visit.    Facility-Administered Medications Ordered in Other Visits  Medication Dose Route Frequency Provider Last Rate Last Dose  . sodium chloride flush (NS) 0.9 % injection 10 mL  10 mL Intravenous PRN [name], MD   10 mL at 03/18/18 0858     PHYSICAL EXAMINATION: ECOG PERFORMANCE STATUS: 0 - Asymptomatic Vitals:   05/21/18 0859  BP: 131/73  Pulse: 75  Resp: 18  Temp: (!) 96.4 F (35.8 C)  SpO2: 95%   Filed Weights   05/21/18 0859  Weight: 212 lb 3.2 oz (96.3 kg)    Physical  Exam  Constitutional: He is oriented to person, place, and time. No distress.  [name]:  Head: Normocephalic and atraumatic.  Mouth/Throat: Oropharynx is clear and moist.  Eyes: Pupils are equal, round, and reactive to light. Conjunctivae and EOM are normal. No scleral icterus.  Neck: Normal range of motion. Neck supple.  Cardiovascular: Normal rate, regular rhythm and normal heart sounds.  Pulmonary/Chest: Effort normal. No respiratory distress. He has no wheezes. He has no rales. He exhibits no tenderness.  Decreased breath sounds bilaterally  Abdominal: Soft. Bowel sounds are normal. He exhibits no distension and no mass. There is no tenderness.  Musculoskeletal: Normal range of motion. He exhibits no edema or deformity.  Lymphadenopathy:    He has no cervical adenopathy.  Neurological: He is alert and oriented to person, place, and time. No cranial nerve deficit. Coordination normal.  Skin: Skin is warm and dry. No rash noted. No erythema.  Psychiatric: He has a normal mood and affect. His behavior is normal. Thought content normal.     LABORATORY DATA:  I have reviewed the data as listed Lab Results  Component Value Date   WBC 6.0 05/21/2018   HGB 13.6 05/21/2018   HCT 41.0 05/21/2018   MCV 89.7 05/21/2018   PLT 313 05/21/2018   Recent Labs    04/22/18 0854 05/06/18 0904 05/21/18 0843  NA 140 138 138  K 3.3* 3.5 3.6  CL 105 102 103  CO2 28 28 27   GLUCOSE 128* 159* 144*  BUN 11 9 12   CREATININE 0.80 0.80 0.85  CALCIUM 9.4 8.9 9.2  GFRNONAA >60 >60 >60  GFRAA >60 >60 >60  PROT 7.3 6.9 7.2  ALBUMIN 3.8 3.6 3.7  AST 19 17 19   ALT 12 11 11   ALKPHOS 61 54 61  BILITOT 0.5 0.5 0.3   RADIOGRAPHIC STUDIES: I have personally reviewed  the radiological images as listed and agreed with the findings in the report. 03/21/2018 CT chest showed mild decreasing size of the right upper lobe pulmonary nodule.  Interval decrease in size of right paratracheal and right hilar  adenopathy.    ASSESSMENT & PLAN:  1. Primary lung adenocarcinoma, right ([place])   2. Encounter for antineoplastic immunotherapy   3. Neuropathy   T3 N1 M0 lung adenocarcinoma, s/p concurrent chemoradiation.   #Stage III lung adenocarcinoma, tolerates immunotherapy well.  No clinical signs of immunotherapy related toxicities. Labs reviewed and discussed with patient.  Counts acceptable to start today's maintenance durvalumab.  Plan total of 26 cycles of Durvalumab.  TSH has been stable.  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 Continue gabapentin 600 mg every day.  Symptoms stable.  # left lower extremity numbness while walking, ? Peripheral vascular disease vs neuropathy. Discussed with patient that for now can monitor his symptoms, if getting worse, suggests patient to get vascular surgeon evaluation.   Return of visit: 2 weeks.  Total face to face encounter time for this patient visit was 81min. >50% of the time was  spent in counseling and coordination of care.    [name], MD, PhD Hematology Oncology [facility] at [facility] Pager- [phone] 05/21/2018

## 2018-05-21 NOTE — Telephone Encounter (Signed)
   Ref Range & Units 2wk ago  Potassium 3.5 - 5.1 mmol/L 3.5

## 2018-05-21 NOTE — Progress Notes (Signed)
Patient here for follow up. Pt states he has soreness to ribs due to coughing. Pt had flu shot at [employer] on 11/18

## 2018-06-04 ENCOUNTER — Inpatient Hospital Stay: Payer: Medicare Other

## 2018-06-04 ENCOUNTER — Encounter: Payer: Self-pay | Admitting: Oncology

## 2018-06-04 ENCOUNTER — Other Ambulatory Visit: Payer: Self-pay

## 2018-06-04 ENCOUNTER — Inpatient Hospital Stay (HOSPITAL_BASED_OUTPATIENT_CLINIC_OR_DEPARTMENT_OTHER): Payer: Medicare Other | Admitting: Oncology

## 2018-06-04 ENCOUNTER — Inpatient Hospital Stay: Payer: Medicare Other | Attending: Oncology

## 2018-06-04 VITALS — BP 145/78 | HR 80 | Temp 96.5°F | Resp 18 | Wt 213.2 lb

## 2018-06-04 DIAGNOSIS — R05 Cough: Secondary | ICD-10-CM | POA: Diagnosis not present

## 2018-06-04 DIAGNOSIS — Z87891 Personal history of nicotine dependence: Secondary | ICD-10-CM

## 2018-06-04 DIAGNOSIS — C3491 Malignant neoplasm of unspecified part of right bronchus or lung: Secondary | ICD-10-CM | POA: Insufficient documentation

## 2018-06-04 DIAGNOSIS — Z5112 Encounter for antineoplastic immunotherapy: Secondary | ICD-10-CM | POA: Insufficient documentation

## 2018-06-04 DIAGNOSIS — G620 Drug-induced polyneuropathy: Secondary | ICD-10-CM | POA: Diagnosis not present

## 2018-06-04 DIAGNOSIS — Z79899 Other long term (current) drug therapy: Secondary | ICD-10-CM | POA: Insufficient documentation

## 2018-06-04 DIAGNOSIS — E876 Hypokalemia: Secondary | ICD-10-CM | POA: Insufficient documentation

## 2018-06-04 LAB — CBC WITH DIFFERENTIAL/PLATELET
Abs Immature Granulocytes: 0.04 10*3/uL (ref 0.00–0.07)
Basophils Absolute: 0 10*3/uL (ref 0.0–0.1)
Basophils Relative: 0 %
EOS PCT: 1 %
Eosinophils Absolute: 0.1 10*3/uL (ref 0.0–0.5)
HCT: 40.4 % (ref 39.0–52.0)
Hemoglobin: 13.7 g/dL (ref 13.0–17.0)
Immature Granulocytes: 1 %
LYMPHS PCT: 10 %
Lymphs Abs: 0.7 10*3/uL (ref 0.7–4.0)
MCH: 29.7 pg (ref 26.0–34.0)
MCHC: 33.9 g/dL (ref 30.0–36.0)
MCV: 87.4 fL (ref 80.0–100.0)
Monocytes Absolute: 0.8 10*3/uL (ref 0.1–1.0)
Monocytes Relative: 11 %
NEUTROS ABS: 5.6 10*3/uL (ref 1.7–7.7)
NEUTROS PCT: 77 %
NRBC: 0 % (ref 0.0–0.2)
Platelets: 254 10*3/uL (ref 150–400)
RBC: 4.62 MIL/uL (ref 4.22–5.81)
RDW: 13.7 % (ref 11.5–15.5)
WBC: 7.2 10*3/uL (ref 4.0–10.5)

## 2018-06-04 LAB — TSH: TSH: 1.908 u[IU]/mL (ref 0.350–4.500)

## 2018-06-04 LAB — COMPREHENSIVE METABOLIC PANEL
ALT: 13 U/L (ref 0–44)
ANION GAP: 12 (ref 5–15)
AST: 20 U/L (ref 15–41)
Albumin: 3.9 g/dL (ref 3.5–5.0)
Alkaline Phosphatase: 56 U/L (ref 38–126)
BUN: 13 mg/dL (ref 8–23)
CHLORIDE: 101 mmol/L (ref 98–111)
CO2: 28 mmol/L (ref 22–32)
CREATININE: 0.79 mg/dL (ref 0.61–1.24)
Calcium: 9.2 mg/dL (ref 8.9–10.3)
GFR calc non Af Amer: >60 mL/min (ref >60)
Glucose, Bld: 127 mg/dL — ABNORMAL HIGH (ref 70–99)
POTASSIUM: 3 mmol/L — AB (ref 3.5–5.1)
Sodium: 141 mmol/L (ref 135–145)
Total Bilirubin: 0.5 mg/dL (ref 0.3–1.2)
Total Protein: 7.4 g/dL (ref 6.5–8.1)

## 2018-06-04 LAB — MAGNESIUM: Magnesium: 2.1 mg/dL (ref 1.7–2.4)

## 2018-06-04 MED ORDER — POTASSIUM CHLORIDE 10 MEQ/100ML IV SOLN: 10.0000 meq | INTRAVENOUS | Status: DC

## 2018-06-04 MED ORDER — SODIUM CHLORIDE 0.9 % IV SOLN
Freq: Once | INTRAVENOUS | Status: AC
  Administered 2018-06-04: 09:00:00 via INTRAVENOUS
  Filled 2018-06-04: qty 250

## 2018-06-04 MED ORDER — SODIUM CHLORIDE 0.9 % IV SOLN
10.0000 mg/kg | Freq: Once | INTRAVENOUS | Status: AC
  Administered 2018-06-04: 960 mg via INTRAVENOUS
  Filled 2018-06-04: qty 19.2

## 2018-06-04 MED ORDER — HEPARIN SOD (PORK) LOCK FLUSH 100 UNIT/ML IV SOLN
500.0000 [IU] | Freq: Once | INTRAVENOUS | Status: AC | PRN
  Administered 2018-06-04: 500 [IU]
  Filled 2018-06-04: qty 5

## 2018-06-04 MED ORDER — POTASSIUM CHLORIDE CRYS ER 20 MEQ PO TBCR: 20.0000 meq | EXTENDED_RELEASE_TABLET | Freq: Every day | ORAL | 2 refills | Status: DC

## 2018-06-04 NOTE — Progress Notes (Signed)
Hematology/Oncology Follow up note [facility] [phone] Fax:(336) [phone]   Patient Care Team: [name], MD as PCP - General (Internal Medicine) [name], RN as Registered Nurse   REASON FOR VISIT Follow up for treatment of Stage III lung adenocarcinoma  Oncology History:  Mark Mccarty is a  67 y.o.  male with  adenocarcinoma of lung, diagnosed via biopsy of right hilar lymph nodes.  Former 43 pack year smoking history.  Presents for assessment prior to concurrent chemo T3 N1 M0  INTERVAL HISTORY Mark Mccarty is a 67 y.o. male who has above history reviewed by me presents for assessment prior to immunotherapy with durvalumab for treatment of stage III non-small cell lung cancer.  Reports tolerating immunotherapy well.  Denies any skin rash, diarrhea, abdominal pain, shortness of breath more than baseline. He has a chronic cough since the start of chemo and radiation.  Sometimes is productive with clear sputum. No wheezing.  Review of Systems  Constitutional: Negative for appetite change, chills, fatigue, fever and unexpected weight change.  [name]:   Negative for hearing loss and voice change.   Eyes: Negative for eye problems and icterus.  Respiratory: Positive for cough and shortness of breath. Negative for chest tightness.   Cardiovascular: Negative for chest pain and leg swelling.  Gastrointestinal: Negative for abdominal distention and abdominal pain.  Endocrine: Negative for hot flashes.  Genitourinary: Negative for difficulty urinating, dysuria and frequency.   Musculoskeletal: Negative for arthralgias.  Skin: Negative for itching and rash.  Neurological: Negative for light-headedness and numbness.  [name]: Negative for adenopathy. Does not bruise/bleed easily.  Psychiatric/Behavioral: Negative for confusion.     MEDICAL HISTORY:  Past Medical History:  Diagnosis Date  . Asthma   . Cancer ([place])    lung   . Cataract    left cataract surgery  . Dyspnea   . [name]   . GERD (gastroesophageal reflux disease)   . Hypertension   . Neuromuscular disorder ([place])    pt has knees bilateral with tendon issues that cause pain    SURGICAL HISTORY: Past Surgical History:  Procedure Laterality Date  . CARDIAC CATHETERIZATION N/A 12/16/2014   Procedure: Left Heart Cath;  Surgeon: [name], MD;  Location: [place] [facility];  Service: Cardiovascular;  Laterality: N/A;  . CYST REMOVAL TRUNK     chest and back over time and it was removed  . ENDOBRONCHIAL ULTRASOUND N/A 12/09/2017   Procedure: ENDOBRONCHIAL ULTRASOUND;  Surgeon: [name], MD;  Location: [name];  Service: Pulmonary;  Laterality: N/A;  . left cataract surgery    . PORTA CATH INSERTION N/A 12/23/2017   Procedure: PORTA CATH INSERTION;  Surgeon: [name], MD;  Location: [place] [facility];  Service: Cardiovascular;  Laterality: N/A;    SOCIAL HISTORY: Social History   Socioeconomic History  . Marital status: Divorced    Spouse name: Not on file  . Number of children: Not on file  . Years of education: Not on file  . Highest education level: Not on file  Occupational History  . Not on file  Social Needs  . Financial resource strain: Not on file  . Food insecurity:    Worry: Not on file    Inability: Not on file  . Transportation needs:    Medical: Not on file    Non-medical: Not on file  Tobacco Use  . Smoking status: Former Smoker    Packs/day: 1.50    Years:  35.00    Pack years: 52.50    Types: Cigarettes    Last attempt to quit: 06/22/2015    Years since quitting: 2.9  . Smokeless tobacco: Never Used  Substance and Sexual Activity  . Alcohol use: Yes    Comment: a fifth in a week  . Drug use: No  . Sexual activity: Not Currently  Lifestyle  . Physical activity:    Days per week: Not on file    Minutes per session: Not on file  . Stress: Not on file  Relationships  . Social  connections:    Talks on phone: Not on file    Gets together: Not on file    Attends religious service: Not on file    Active member of club or organization: Not on file    Attends meetings of clubs or organizations: Not on file    Relationship status: Not on file  . Intimate partner violence:    Fear of current or ex partner: Not on file    Emotionally abused: Not on file    Physically abused: Not on file    Forced sexual activity: Not on file  Other Topics Concern  . Not on file  Social History Narrative  . Not on file    FAMILY HISTORY: Family History  Problem Relation Age of Onset  . Hypertension Mother   . Breast cancer Mother   . Heart attack Mother   . Lung cancer Father   . Heart disease Sister   . Diabetes Sister   . Colon cancer Sister     ALLERGIES:  is allergic to penicillins.  MEDICATIONS:  Current Outpatient Medications  Medication Sig Dispense Refill  . acetaminophen (TYLENOL) 500 MG tablet Take 500 mg by mouth as needed.    [name] albuterol (PROVENTIL HFA;VENTOLIN HFA) 108 (90 Base) MCG/ACT inhaler Inhale 2 puffs into the lungs every 6 (six) hours as needed.  12  . amLODipine (NORVASC) 10 MG tablet Take 10 mg by mouth daily.    Jearl Klinefelter ELLIPTA 62.5-25 MCG/INH AEPB TAKE 1 PUFF BY MOUTH EVERY DAY 180 each 3  . aspirin 81 MG tablet Take 81 mg by mouth daily.    . benzonatate (TESSALON) 200 MG capsule TAKE 1 CAPSULE BY MOUTH EVERY DAY 3 TIMES A DAY AS NEEDED FOR COUGH  0  . carvedilol (COREG) 25 MG tablet Take 25 mg by mouth 2 (two) times daily with a meal.    . fluticasone (FLONASE) 50 MCG/ACT nasal spray PLACE 1 SPRAY INTO BOTH NOSTRILS 2 (TWO) TIMES DAILY  0  . gabapentin (NEURONTIN) 300 MG capsule Take 300 mg by mouth at bedtime as needed.     [name] ibuprofen (ADVIL,MOTRIN) 600 MG tablet Take 600 mg by mouth every 6 (six) hours as needed.    . lidocaine-prilocaine (EMLA) cream Apply to affected area once 30 g 3  . lisinopril (PRINIVIL,ZESTRIL) 40 MG tablet Take 40  mg by mouth daily.    . mupirocin ointment (BACTROBAN) 2 % Place 1 application into the nose 3 (three) times daily. 15 g 0  . omeprazole (PRILOSEC) 20 MG capsule Take 20 mg by mouth 2 (two) times daily before a meal.    . ondansetron (ZOFRAN) 8 MG tablet Take 1 tablet (8 mg total) by mouth 2 (two) times daily as needed for refractory nausea / vomiting. 30 tablet 1  . prochlorperazine (COMPAZINE) 10 MG tablet Take 1 tablet (10 mg total) by mouth every 6 (six) hours  as needed (Nausea or vomiting). 30 tablet 1  . senna (SENOKOT) 8.6 MG TABS tablet Take 2 tablets (17.2 mg total) by mouth daily. 120 each 0  . tamsulosin (FLOMAX) 0.4 MG CAPS capsule Take 0.4 mg by mouth daily.    [name] doxycycline (VIBRAMYCIN) 100 MG capsule Take 100 mg by mouth 2 (two) times daily.    [name] levofloxacin (LEVAQUIN) 250 MG tablet Take 250 mg by mouth daily.    . potassium chloride SA (KLOR-CON M20) 20 MEQ tablet Take 1 tablet (20 mEq total) by mouth daily. 30 tablet 2  . zolpidem (AMBIEN) 5 MG tablet Take 1 tablet (5 mg total) by mouth at bedtime as needed for sleep. (Patient not taking: Reported on 04/22/2018) 30 tablet 0   No current facility-administered medications for this visit.    Facility-Administered Medications Ordered in Other Visits  Medication Dose Route Frequency Provider Last Rate Last Dose  . sodium chloride flush (NS) 0.9 % injection 10 mL  10 mL Intravenous PRN [name], MD   10 mL at 03/18/18 0858     PHYSICAL EXAMINATION: ECOG PERFORMANCE STATUS: 0 - Asymptomatic Vitals:   06/04/18 0847  BP: (!) 145/78  Pulse: 80  Resp: 18  Temp: (!) 96.5 F (35.8 C)  SpO2: 93%   Filed Weights   06/04/18 0847  Weight: 213 lb 3.2 oz (96.7 kg)    Physical Exam  Constitutional: He is oriented to person, place, and time. No distress.  [name]:  Head: Normocephalic and atraumatic.  Mouth/Throat: Oropharynx is clear and moist.  Eyes: Pupils are equal, round, and reactive to light. Conjunctivae and EOM are normal.  No scleral icterus.  Neck: Normal range of motion. Neck supple.  Cardiovascular: Normal rate, regular rhythm and normal heart sounds.  Pulmonary/Chest: Effort normal. No respiratory distress. He has no wheezes. He has no rales. He exhibits no tenderness.  Decreased breath sounds bilaterally  Abdominal: Soft. Bowel sounds are normal. He exhibits no distension and no mass. There is no tenderness.  Musculoskeletal: Normal range of motion. He exhibits no edema or deformity.  Lymphadenopathy:    He has no cervical adenopathy.  Neurological: He is alert and oriented to person, place, and time. No cranial nerve deficit. Coordination normal.  Skin: Skin is warm and dry. No rash noted. No erythema.  Psychiatric: He has a normal mood and affect. His behavior is normal. Thought content normal.     LABORATORY DATA:  I have reviewed the data as listed Lab Results  Component Value Date   WBC 7.2 06/04/2018   HGB 13.7 06/04/2018   HCT 40.4 06/04/2018   MCV 87.4 06/04/2018   PLT 254 06/04/2018   Recent Labs    05/06/18 0904 05/21/18 0843 06/04/18 0826  NA 138 138 141  K 3.5 3.6 3.0*  CL 102 103 101  CO2 28 27 28   GLUCOSE 159* 144* 127*  BUN 9 12 13   CREATININE 0.80 0.85 0.79  CALCIUM 8.9 9.2 9.2  GFRNONAA >60 >60 >60  GFRAA >60 >60 >60  PROT 6.9 7.2 7.4  ALBUMIN 3.6 3.7 3.9  AST 17 19 20   ALT 11 11 13   ALKPHOS 54 61 56  BILITOT 0.5 0.3 0.5   RADIOGRAPHIC STUDIES: I have personally reviewed the radiological images as listed and agreed with the findings in the report. 03/21/2018 CT chest showed mild decreasing size of the right upper lobe pulmonary nodule.  Interval decrease in size of right paratracheal and right hilar adenopathy.  ASSESSMENT & PLAN:  1. Primary lung adenocarcinoma, right ([place])   2. Hypokalemia   T3 N1 M0 lung adenocarcinoma, s/p concurrent chemoradiation.   #Stage III lung adenocarcinoma, Tolerate immunotherapy well.  No clinical signs of immunotherapy  related toxicities. Labs reviewed and discussed with patient.  Counseled several to proceed with today's maintenance durvalumab.  Plan total of 26 cycles Durvalumab.  TSH has been stable.  #Grade 1 neuropathy, due to previous chemotherapy.  Continue gabapentin 600 mg every day.  Symptom has been well controlled.  Return of visit: 2 weeks. Total face to face encounter time for this patient visit was 92min. >50% of the time was  spent in counseling and coordination of care.    [name], MD, PhD Hematology Oncology [facility] at [facility] Pager- [phone] 06/04/2018

## 2018-06-04 NOTE — Progress Notes (Signed)
Patient here for follow up. No concerns voiced.  °

## 2018-06-18 ENCOUNTER — Inpatient Hospital Stay: Payer: Medicare Other

## 2018-06-18 ENCOUNTER — Encounter: Payer: Self-pay | Admitting: Oncology

## 2018-06-18 ENCOUNTER — Inpatient Hospital Stay (HOSPITAL_BASED_OUTPATIENT_CLINIC_OR_DEPARTMENT_OTHER): Payer: Medicare Other | Admitting: Oncology

## 2018-06-18 ENCOUNTER — Other Ambulatory Visit: Payer: Self-pay

## 2018-06-18 VITALS — BP 139/76 | HR 72 | Temp 96.5°F | Resp 18 | Wt 213.3 lb

## 2018-06-18 DIAGNOSIS — Z5112 Encounter for antineoplastic immunotherapy: Secondary | ICD-10-CM

## 2018-06-18 DIAGNOSIS — C3491 Malignant neoplasm of unspecified part of right bronchus or lung: Secondary | ICD-10-CM

## 2018-06-18 DIAGNOSIS — E876 Hypokalemia: Secondary | ICD-10-CM

## 2018-06-18 DIAGNOSIS — R05 Cough: Secondary | ICD-10-CM

## 2018-06-18 DIAGNOSIS — Z87891 Personal history of nicotine dependence: Secondary | ICD-10-CM | POA: Diagnosis not present

## 2018-06-18 LAB — COMPREHENSIVE METABOLIC PANEL
ALT: 16 U/L (ref 0–44)
AST: 23 U/L (ref 15–41)
Albumin: 3.9 g/dL (ref 3.5–5.0)
Alkaline Phosphatase: 57 U/L (ref 38–126)
Anion gap: 9 (ref 5–15)
BUN: 14 mg/dL (ref 8–23)
CO2: 28 mmol/L (ref 22–32)
Calcium: 9.4 mg/dL (ref 8.9–10.3)
Chloride: 103 mmol/L (ref 98–111)
Creatinine, Ser: 0.87 mg/dL (ref 0.61–1.24)
Glucose, Bld: 125 mg/dL — ABNORMAL HIGH (ref 70–99)
Potassium: 3.6 mmol/L (ref 3.5–5.1)
Sodium: 140 mmol/L (ref 135–145)
Total Bilirubin: 0.5 mg/dL (ref 0.3–1.2)
Total Protein: 7.3 g/dL (ref 6.5–8.1)

## 2018-06-18 LAB — CBC WITH DIFFERENTIAL/PLATELET
Abs Immature Granulocytes: 0.02 10*3/uL (ref 0.00–0.07)
Basophils Absolute: 0 10*3/uL (ref 0.0–0.1)
Basophils Relative: 0 %
Eosinophils Absolute: 0.1 10*3/uL (ref 0.0–0.5)
Eosinophils Relative: 2 %
HCT: 42.9 % (ref 39.0–52.0)
Hemoglobin: 14.4 g/dL (ref 13.0–17.0)
Immature Granulocytes: 0 %
Lymphocytes Relative: 13 %
Lymphs Abs: 0.8 10*3/uL (ref 0.7–4.0)
MCH: 29 pg (ref 26.0–34.0)
MCHC: 33.6 g/dL (ref 30.0–36.0)
MCV: 86.3 fL (ref 80.0–100.0)
Monocytes Absolute: 0.8 10*3/uL (ref 0.1–1.0)
Monocytes Relative: 12 %
NEUTROS ABS: 4.5 10*3/uL (ref 1.7–7.7)
NRBC: 0 % (ref 0.0–0.2)
Neutrophils Relative %: 73 %
Platelets: 276 10*3/uL (ref 150–400)
RBC: 4.97 MIL/uL (ref 4.22–5.81)
RDW: 13.5 % (ref 11.5–15.5)
WBC: 6.2 10*3/uL (ref 4.0–10.5)

## 2018-06-18 MED ORDER — HEPARIN SOD (PORK) LOCK FLUSH 100 UNIT/ML IV SOLN
500.0000 [IU] | Freq: Once | INTRAVENOUS | Status: AC
  Administered 2018-06-18: 500 [IU] via INTRAVENOUS
  Filled 2018-06-18: qty 5

## 2018-06-18 MED ORDER — SODIUM CHLORIDE 0.9 % IV SOLN
Freq: Once | INTRAVENOUS | Status: AC
  Administered 2018-06-18: 10:00:00 via INTRAVENOUS
  Filled 2018-06-18: qty 250

## 2018-06-18 MED ORDER — SODIUM CHLORIDE 0.9 % IV SOLN
10.0000 mg/kg | Freq: Once | INTRAVENOUS | Status: AC
  Administered 2018-06-18: 960 mg via INTRAVENOUS
  Filled 2018-06-18: qty 19.2

## 2018-06-18 MED ORDER — SODIUM CHLORIDE 0.9% FLUSH
10.0000 mL | Freq: Once | INTRAVENOUS | Status: AC
  Administered 2018-06-18: 10 mL via INTRAVENOUS
  Filled 2018-06-18: qty 10

## 2018-06-18 NOTE — Progress Notes (Signed)
Patient here for follow up. No concerns voiced.  °

## 2018-06-18 NOTE — Progress Notes (Signed)
Hematology/Oncology Follow up note [facility] [phone] [phone]   Patient Care Team: [name], MD as PCP - General (Internal Medicine) [name], RN as Registered Nurse   REASON FOR VISIT Follow up for treatment of Stage III lung adenocarcinoma  Oncology History:  Mark Mccarty is a  67 y.o.  male with  adenocarcinoma of lung, diagnosed via biopsy of right hilar lymph nodes.  Former 43 pack year smoking history.  Presents for assessment prior to concurrent chemo T3 N1 M0  INTERVAL HISTORY Mark Mccarty is a 67 y.o. male who has above history reviewed by me presents for assessment prior to immunotherapy with durvalumab for treatment of stage III non-small cell lung cancer.  Patient reports tolerating immunotherapy well.  Denies any skin rash, diarrhea, abdominal pain, shortness of breath more than his baseline. He has a chronic cough since the start of the chemotherapy and radiation.  2 weeks ago, he feels a little more congested.  Today Back to baseline.  No wheezing.  Review of Systems  Constitutional: Negative for appetite change, chills, fatigue, fever and unexpected weight change.  [name]:   Negative for hearing loss and voice change.   Eyes: Negative for eye problems and icterus.  Respiratory: Positive for cough and shortness of breath. Negative for chest tightness.   Cardiovascular: Negative for chest pain and leg swelling.  Gastrointestinal: Negative for abdominal distention and abdominal pain.  Endocrine: Negative for hot flashes.  Genitourinary: Negative for difficulty urinating, dysuria and frequency.   Musculoskeletal: Negative for arthralgias.  Skin: Negative for itching and rash.  Neurological: Negative for light-headedness and numbness.  [name]: Negative for adenopathy. Does not bruise/bleed easily.  Psychiatric/Behavioral: Negative for confusion.     MEDICAL HISTORY:  Past Medical History:    Diagnosis Date  . Asthma   . Cancer ([place])    lung  . Cataract    left cataract surgery  . Dyspnea   . [name]   . GERD (gastroesophageal reflux disease)   . Hypertension   . Neuromuscular disorder ([place])    pt has knees bilateral with tendon issues that cause pain    SURGICAL HISTORY: Past Surgical History:  Procedure Laterality Date  . CARDIAC CATHETERIZATION N/A 12/16/2014   Procedure: Left Heart Cath;  Surgeon: [name], MD;  Location: [place] [facility];  Service: Cardiovascular;  Laterality: N/A;  . CYST REMOVAL TRUNK     chest and back over time and it was removed  . ENDOBRONCHIAL ULTRASOUND N/A 12/09/2017   Procedure: ENDOBRONCHIAL ULTRASOUND;  Surgeon: [name], MD;  Location: [name];  Service: Pulmonary;  Laterality: N/A;  . left cataract surgery    . PORTA CATH INSERTION N/A 12/23/2017   Procedure: PORTA CATH INSERTION;  Surgeon: [name], MD;  Location: [place] [facility];  Service: Cardiovascular;  Laterality: N/A;    SOCIAL HISTORY: Social History   Socioeconomic History  . Marital status: Divorced    Spouse name: Not on file  . Number of children: Not on file  . Years of education: Not on file  . Highest education level: Not on file  Occupational History  . Not on file  Social Needs  . Financial resource strain: Not on file  . Food insecurity:    Worry: Not on file    Inability: Not on file  . Transportation needs:    Medical: Not on file    Non-medical: Not on file  Tobacco Use  .  Smoking status: Former Smoker    Packs/day: 1.50    Years: 35.00    Pack years: 52.50    Types: Cigarettes    Last attempt to quit: 06/22/2015    Years since quitting: 2.9  . Smokeless tobacco: Never Used  Substance and Sexual Activity  . Alcohol use: Yes    Comment: a fifth in a week  . Drug use: No  . Sexual activity: Not Currently  Lifestyle  . Physical activity:    Days per week: Not on file    Minutes per session: Not on  file  . Stress: Not on file  Relationships  . Social connections:    Talks on phone: Not on file    Gets together: Not on file    Attends religious service: Not on file    Active member of club or organization: Not on file    Attends meetings of clubs or organizations: Not on file    Relationship status: Not on file  . Intimate partner violence:    Fear of current or ex partner: Not on file    Emotionally abused: Not on file    Physically abused: Not on file    Forced sexual activity: Not on file  Other Topics Concern  . Not on file  Social History Narrative  . Not on file    FAMILY HISTORY: Family History  Problem Relation Age of Onset  . Hypertension Mother   . Breast cancer Mother   . Heart attack Mother   . Lung cancer Father   . Heart disease Sister   . Diabetes Sister   . Colon cancer Sister     ALLERGIES:  is allergic to penicillins.  MEDICATIONS:  Current Outpatient Medications  Medication Sig Dispense Refill  . acetaminophen (TYLENOL) 500 MG tablet Take 500 mg by mouth as needed.    [name] albuterol (PROVENTIL HFA;VENTOLIN HFA) 108 (90 Base) MCG/ACT inhaler Inhale 2 puffs into the lungs every 6 (six) hours as needed.  12  . amLODipine (NORVASC) 10 MG tablet Take 10 mg by mouth daily.    Jearl Klinefelter ELLIPTA 62.5-25 MCG/INH AEPB TAKE 1 PUFF BY MOUTH EVERY DAY 180 each 3  . aspirin 81 MG tablet Take 81 mg by mouth daily.    . benzonatate (TESSALON) 200 MG capsule TAKE 1 CAPSULE BY MOUTH EVERY DAY 3 TIMES A DAY AS NEEDED FOR COUGH  0  . carvedilol (COREG) 25 MG tablet Take 25 mg by mouth 2 (two) times daily with a meal.    . doxycycline (VIBRAMYCIN) 100 MG capsule Take 100 mg by mouth 2 (two) times daily.    . fluticasone (FLONASE) 50 MCG/ACT nasal spray PLACE 1 SPRAY INTO BOTH NOSTRILS 2 (TWO) TIMES DAILY  0  . gabapentin (NEURONTIN) 300 MG capsule Take 300 mg by mouth at bedtime as needed.     [name] ibuprofen (ADVIL,MOTRIN) 600 MG tablet Take 600 mg by mouth every 6 (six)  hours as needed.    [name] levofloxacin (LEVAQUIN) 250 MG tablet Take 250 mg by mouth daily.    [name] lidocaine-prilocaine (EMLA) cream Apply to affected area once 30 g 3  . lisinopril (PRINIVIL,ZESTRIL) 40 MG tablet Take 40 mg by mouth daily.    . mupirocin ointment (BACTROBAN) 2 % Place 1 application into the nose 3 (three) times daily. 15 g 0  . omeprazole (PRILOSEC) 20 MG capsule Take 20 mg by mouth 2 (two) times daily before a meal.    .  ondansetron (ZOFRAN) 8 MG tablet Take 1 tablet (8 mg total) by mouth 2 (two) times daily as needed for refractory nausea / vomiting. 30 tablet 1  . potassium chloride SA (KLOR-CON M20) 20 MEQ tablet Take 1 tablet (20 mEq total) by mouth daily. 30 tablet 2  . prochlorperazine (COMPAZINE) 10 MG tablet Take 1 tablet (10 mg total) by mouth every 6 (six) hours as needed (Nausea or vomiting). 30 tablet 1  . senna (SENOKOT) 8.6 MG TABS tablet Take 2 tablets (17.2 mg total) by mouth daily. 120 each 0  . tamsulosin (FLOMAX) 0.4 MG CAPS capsule Take 0.4 mg by mouth daily.    [name] zolpidem (AMBIEN) 5 MG tablet Take 1 tablet (5 mg total) by mouth at bedtime as needed for sleep. (Patient not taking: Reported on 04/22/2018) 30 tablet 0   No current facility-administered medications for this visit.    Facility-Administered Medications Ordered in Other Visits  Medication Dose Route Frequency Provider Last Rate Last Dose  . sodium chloride flush (NS) 0.9 % injection 10 mL  10 mL Intravenous PRN [name], MD   10 mL at 03/18/18 0858     PHYSICAL EXAMINATION: ECOG PERFORMANCE STATUS: 0 - Asymptomatic Vitals:   06/18/18 0848  BP: 139/76  Pulse: 72  Resp: 18  Temp: (!) 96.5 F (35.8 C)  SpO2: 93%   Filed Weights   06/18/18 0848  Weight: 213 lb 4.8 oz (96.8 kg)    Physical Exam Constitutional:      General: He is not in acute distress. [name]:     Head: Normocephalic and atraumatic.  Eyes:     General: No scleral icterus.    Conjunctiva/sclera: Conjunctivae normal.      Pupils: Pupils are equal, round, and reactive to light.  Neck:     Musculoskeletal: Normal range of motion and neck supple.  Cardiovascular:     Rate and Rhythm: Normal rate and regular rhythm.     Heart sounds: Normal heart sounds.  Pulmonary:     Effort: Pulmonary effort is normal. No respiratory distress.     Breath sounds: No wheezing or rales.  Chest:     Chest wall: No tenderness.  Abdominal:     General: Bowel sounds are normal. There is no distension.     Palpations: Abdomen is soft. There is no mass.     Tenderness: There is no abdominal tenderness.  Musculoskeletal: Normal range of motion.        General: No deformity.  Lymphadenopathy:     Cervical: No cervical adenopathy.  Skin:    General: Skin is warm and dry.     Findings: No erythema or rash.  Neurological:     Mental Status: He is alert and oriented to person, place, and time.     Cranial Nerves: No cranial nerve deficit.     Coordination: Coordination normal.  Psychiatric:        Behavior: Behavior normal.        Thought Content: Thought content normal.      LABORATORY DATA:  I have reviewed the data as listed Lab Results  Component Value Date   WBC 6.2 06/18/2018   HGB 14.4 06/18/2018   HCT 42.9 06/18/2018   MCV 86.3 06/18/2018   PLT 276 06/18/2018   Recent Labs    05/21/18 0843 06/04/18 0826 06/18/18 0836  NA 138 141 140  K 3.6 3.0* 3.6  CL 103 101 103  CO2 27 28 28   GLUCOSE 144*  127* 125*  BUN 12 13 14   CREATININE 0.85 0.79 0.87  CALCIUM 9.2 9.2 9.4  GFRNONAA >60 >60 >60  GFRAA >60 >60 >60  PROT 7.2 7.4 7.3  ALBUMIN 3.7 3.9 3.9  AST 19 20 23   ALT 11 13 16   ALKPHOS 61 56 57  BILITOT 0.3 0.5 0.5   RADIOGRAPHIC STUDIES: I have personally reviewed the radiological images as listed and agreed with the findings in the report. 03/21/2018 CT chest showed mild decreasing size of the right upper lobe pulmonary nodule.  Interval decrease in size of right paratracheal and right hilar  adenopathy.    ASSESSMENT & PLAN:  1. Primary lung adenocarcinoma, right ([place])   2. Encounter for antineoplastic immunotherapy   3. Hypokalemia   T3 N1 M0 lung adenocarcinoma, s/p concurrent chemoradiation.   #Stage III lung adenocarcinoma, Tolerates chemotherapy well.  No clinical signs of immunotherapy related toxicity so far. Labs are reviewed and discussed with patient.  Counts are acceptable to proceed with today's maintenance durvalumab. Plan total of 26 durvalumab treatments. TSH has been monitored and has been stable.  # Hypokalemia, potassium improved.  Advised patient to continue taking daily potassium supplements.  Return of visit: 2 weeks  Total face to face encounter time for this patient visit was 25 min. >50% of the time was  spent in counseling and coordination of care.   [name], MD, PhD Hematology Oncology [facility] at [facility] Pager- [phone] 06/18/2018

## 2018-06-19 ENCOUNTER — Telehealth: Payer: Self-pay | Admitting: *Deleted

## 2018-06-19 NOTE — Telephone Encounter (Signed)
Per Almyra Free 06/18/18 scheduling message to schedule patient for a CT CT was scheduled as requested. I tried calling the patient to make him aware of the 12/30 scheduled CT scan However, I was unable to reach him directly, so a detailed message was left making him aware of the  Appt. Also a letter will be mailed out today as well.

## 2018-06-30 ENCOUNTER — Ambulatory Visit
Admission: RE | Admit: 2018-06-30 | Discharge: 2018-06-30 | Disposition: A | Discharge status: Home / Self Care | Payer: Medicare Other | Source: Ambulatory Visit | Attending: Oncology | Admitting: Oncology

## 2018-06-30 DIAGNOSIS — C3491 Malignant neoplasm of unspecified part of right bronchus or lung: Secondary | ICD-10-CM | POA: Insufficient documentation

## 2018-07-04 ENCOUNTER — Inpatient Hospital Stay: Payer: Medicare Other | Attending: Oncology

## 2018-07-04 ENCOUNTER — Inpatient Hospital Stay: Payer: Medicare Other

## 2018-07-04 ENCOUNTER — Other Ambulatory Visit: Payer: Self-pay

## 2018-07-04 ENCOUNTER — Inpatient Hospital Stay (HOSPITAL_BASED_OUTPATIENT_CLINIC_OR_DEPARTMENT_OTHER): Payer: Medicare Other | Admitting: Oncology

## 2018-07-04 ENCOUNTER — Encounter: Payer: Self-pay | Admitting: Oncology

## 2018-07-04 VITALS — BP 125/75 | HR 74 | Temp 96.8°F | Resp 18 | Wt 212.4 lb

## 2018-07-04 DIAGNOSIS — E876 Hypokalemia: Secondary | ICD-10-CM | POA: Insufficient documentation

## 2018-07-04 DIAGNOSIS — Z87891 Personal history of nicotine dependence: Secondary | ICD-10-CM | POA: Insufficient documentation

## 2018-07-04 DIAGNOSIS — R222 Localized swelling, mass and lump, trunk: Secondary | ICD-10-CM | POA: Diagnosis not present

## 2018-07-04 DIAGNOSIS — C3491 Malignant neoplasm of unspecified part of right bronchus or lung: Secondary | ICD-10-CM | POA: Diagnosis not present

## 2018-07-04 DIAGNOSIS — Z5112 Encounter for antineoplastic immunotherapy: Secondary | ICD-10-CM | POA: Insufficient documentation

## 2018-07-04 DIAGNOSIS — R05 Cough: Secondary | ICD-10-CM | POA: Diagnosis not present

## 2018-07-04 DIAGNOSIS — Z79899 Other long term (current) drug therapy: Secondary | ICD-10-CM | POA: Diagnosis not present

## 2018-07-04 DIAGNOSIS — Z95828 Presence of other vascular implants and grafts: Secondary | ICD-10-CM

## 2018-07-04 LAB — COMPREHENSIVE METABOLIC PANEL
ALT: 20 U/L (ref 0–44)
AST: 28 U/L (ref 15–41)
Albumin: 3.8 g/dL (ref 3.5–5.0)
Alkaline Phosphatase: 60 U/L (ref 38–126)
Anion gap: 8 (ref 5–15)
BILIRUBIN TOTAL: 0.6 mg/dL (ref 0.3–1.2)
BUN: 13 mg/dL (ref 8–23)
CALCIUM: 9 mg/dL (ref 8.9–10.3)
CO2: 28 mmol/L (ref 22–32)
Chloride: 103 mmol/L (ref 98–111)
Creatinine, Ser: 1.02 mg/dL (ref 0.61–1.24)
GFR calc Af Amer: >60 mL/min (ref >60)
Glucose, Bld: 117 mg/dL — ABNORMAL HIGH (ref 70–99)
Potassium: 3.5 mmol/L (ref 3.5–5.1)
Sodium: 139 mmol/L (ref 135–145)
TOTAL PROTEIN: 7.1 g/dL (ref 6.5–8.1)

## 2018-07-04 LAB — CBC WITH DIFFERENTIAL/PLATELET
Abs Immature Granulocytes: 0.03 10*3/uL (ref 0.00–0.07)
Basophils Absolute: 0 10*3/uL (ref 0.0–0.1)
Basophils Relative: 0 %
EOS ABS: 0.1 10*3/uL (ref 0.0–0.5)
EOS PCT: 1 %
HCT: 42.7 % (ref 39.0–52.0)
Hemoglobin: 14.3 g/dL (ref 13.0–17.0)
Immature Granulocytes: 0 %
Lymphocytes Relative: 9 %
Lymphs Abs: 0.7 10*3/uL (ref 0.7–4.0)
MCH: 28.8 pg (ref 26.0–34.0)
MCHC: 33.5 g/dL (ref 30.0–36.0)
MCV: 86.1 fL (ref 80.0–100.0)
Monocytes Absolute: 0.9 10*3/uL (ref 0.1–1.0)
Monocytes Relative: 11 %
Neutro Abs: 6.4 10*3/uL (ref 1.7–7.7)
Neutrophils Relative %: 79 %
PLATELETS: 205 10*3/uL (ref 150–400)
RBC: 4.96 MIL/uL (ref 4.22–5.81)
RDW: 14.6 % (ref 11.5–15.5)
WBC: 8.2 10*3/uL (ref 4.0–10.5)
nRBC: 0 % (ref 0.0–0.2)

## 2018-07-04 LAB — TSH: TSH: 2.293 u[IU]/mL (ref 0.350–4.500)

## 2018-07-04 MED ORDER — SODIUM CHLORIDE 0.9 % IV SOLN
Freq: Once | INTRAVENOUS | Status: AC
  Administered 2018-07-04: 11:00:00 via INTRAVENOUS
  Filled 2018-07-04: qty 250

## 2018-07-04 MED ORDER — SODIUM CHLORIDE 0.9% FLUSH
10.0000 mL | INTRAVENOUS | Status: DC | PRN
  Administered 2018-07-04: 10 mL via INTRAVENOUS
  Filled 2018-07-04: qty 10

## 2018-07-04 MED ORDER — SODIUM CHLORIDE 0.9 % IV SOLN
10.0000 mg/kg | Freq: Once | INTRAVENOUS | Status: AC
  Administered 2018-07-04: 960 mg via INTRAVENOUS
  Filled 2018-07-04: qty 9.6

## 2018-07-04 MED ORDER — HEPARIN SOD (PORK) LOCK FLUSH 100 UNIT/ML IV SOLN
500.0000 [IU] | Freq: Once | INTRAVENOUS | Status: AC
  Administered 2018-07-04: 500 [IU] via INTRAVENOUS

## 2018-07-04 NOTE — Progress Notes (Signed)
Hematology/Oncology Follow up note [facility] [phone] Fax:([phone]   Patient Care Team: [name], MD as PCP - General (Internal Medicine) [name], RN as Registered Nurse   REASON FOR VISIT Follow up for treatment of Stage III lung adenocarcinoma  Oncology History:  Mark Mccarty is a  68 y.o.  male with  adenocarcinoma of lung, diagnosed via biopsy of right hilar lymph nodes.  Former 43 pack year smoking history.  Presents for assessment prior to concurrent chemo T3 N1 M0  INTERVAL HISTORY Mark Mccarty is a 68 y.o. male who has above history reviewed by me presents for assessment prior to immunotherapy with durvalumab for treatment of stage III non-small cell lung cancer. During the interval patient has had surveillance CT chest images done. Reports doing well.  Tolerating immunotherapy well.  Denies any skin rash, diarrhea, abdominal pain, shortness of breath more than his baseline. Chronic cough at baseline. He report noticing left supraclavicular area puffiness this morning.  No exacerbating or alleviating factors..  No tenderness or pain.   Review of Systems  Constitutional: Negative for appetite change, chills, fatigue, fever and unexpected weight change.  [name]:   Negative for hearing loss and voice change.   Eyes: Negative for eye problems and icterus.  Respiratory: Positive for cough and shortness of breath. Negative for chest tightness.   Cardiovascular: Negative for chest pain and leg swelling.  Gastrointestinal: Negative for abdominal distention and abdominal pain.  Endocrine: Negative for hot flashes.  Genitourinary: Negative for difficulty urinating, dysuria and frequency.   Musculoskeletal: Negative for arthralgias.  Skin: Negative for itching and rash.  Neurological: Negative for light-headedness and numbness.  [name]: Negative for adenopathy. Does not bruise/bleed easily.    Psychiatric/Behavioral: Negative for confusion.     MEDICAL HISTORY:  Past Medical History:  Diagnosis Date  . Asthma   . Cancer ([place])    lung  . Cataract    left cataract surgery  . Dyspnea   . [name]   . GERD (gastroesophageal reflux disease)   . Hypertension   . Neuromuscular disorder ([place])    pt has knees bilateral with tendon issues that cause pain    SURGICAL HISTORY: Past Surgical History:  Procedure Laterality Date  . CARDIAC CATHETERIZATION N/A 12/16/2014   Procedure: Left Heart Cath;  Surgeon: [name], MD;  Location: [place] [facility];  Service: Cardiovascular;  Laterality: N/A;  . CYST REMOVAL TRUNK     chest and back over time and it was removed  . ENDOBRONCHIAL ULTRASOUND N/A 12/09/2017   Procedure: ENDOBRONCHIAL ULTRASOUND;  Surgeon: [name], MD;  Location: [name];  Service: Pulmonary;  Laterality: N/A;  . left cataract surgery    . PORTA CATH INSERTION N/A 12/23/2017   Procedure: PORTA CATH INSERTION;  Surgeon: [name], MD;  Location: [place] [facility];  Service: Cardiovascular;  Laterality: N/A;    SOCIAL HISTORY: Social History   Socioeconomic History  . Marital status: Divorced    Spouse name: Not on file  . Number of children: Not on file  . Years of education: Not on file  . Highest education level: Not on file  Occupational History  . Not on file  Social Needs  . Financial resource strain: Not on file  . Food insecurity:    Worry: Not on file    Inability: Not on file  . Transportation needs:    Medical: Not on file    Non-medical: Not on  file  Tobacco Use  . Smoking status: Former Smoker    Packs/day: 1.50    Years: 35.00    Pack years: 52.50    Types: Cigarettes    Last attempt to quit: 06/22/2015    Years since quitting: 3.0  . Smokeless tobacco: Never Used  Substance and Sexual Activity  . Alcohol use: Yes    Comment: a fifth in a week  . Drug use: No  . Sexual activity: Not Currently   Lifestyle  . Physical activity:    Days per week: Not on file    Minutes per session: Not on file  . Stress: Not on file  Relationships  . Social connections:    Talks on phone: Not on file    Gets together: Not on file    Attends religious service: Not on file    Active member of club or organization: Not on file    Attends meetings of clubs or organizations: Not on file    Relationship status: Not on file  . Intimate partner violence:    Fear of current or ex partner: Not on file    Emotionally abused: Not on file    Physically abused: Not on file    Forced sexual activity: Not on file  Other Topics Concern  . Not on file  Social History Narrative  . Not on file    FAMILY HISTORY: Family History  Problem Relation Age of Onset  . Hypertension Mother   . Breast cancer Mother   . Heart attack Mother   . Lung cancer Father   . Heart disease Sister   . Diabetes Sister   . Colon cancer Sister     ALLERGIES:  is allergic to penicillins.  MEDICATIONS:  Current Outpatient Medications  Medication Sig Dispense Refill  . acetaminophen (TYLENOL) 500 MG tablet Take 500 mg by mouth as needed.    [name] albuterol (PROVENTIL HFA;VENTOLIN HFA) 108 (90 Base) MCG/ACT inhaler Inhale 2 puffs into the lungs every 6 (six) hours as needed.  12  . amLODipine (NORVASC) 10 MG tablet Take 10 mg by mouth daily.    Jearl Klinefelter ELLIPTA 62.5-25 MCG/INH AEPB TAKE 1 PUFF BY MOUTH EVERY DAY 180 each 3  . aspirin 81 MG tablet Take 81 mg by mouth daily.    . carvedilol (COREG) 25 MG tablet Take 25 mg by mouth 2 (two) times daily with a meal.    . fluticasone (FLONASE) 50 MCG/ACT nasal spray PLACE 1 SPRAY INTO BOTH NOSTRILS 2 (TWO) TIMES DAILY  0  . gabapentin (NEURONTIN) 300 MG capsule Take 300 mg by mouth at bedtime as needed.     [name] ibuprofen (ADVIL,MOTRIN) 600 MG tablet Take 600 mg by mouth every 6 (six) hours as needed.    . lidocaine-prilocaine (EMLA) cream Apply to affected area once 30 g 3  .  lisinopril (PRINIVIL,ZESTRIL) 40 MG tablet Take 40 mg by mouth daily.    . mupirocin ointment (BACTROBAN) 2 % Place 1 application into the nose 3 (three) times daily. 15 g 0  . omeprazole (PRILOSEC) 20 MG capsule Take 20 mg by mouth 2 (two) times daily before a meal.    . ondansetron (ZOFRAN) 8 MG tablet Take 1 tablet (8 mg total) by mouth 2 (two) times daily as needed for refractory nausea / vomiting. (Patient taking differently: Take 8 mg by mouth 2 (two) times daily as needed for refractory nausea / vomiting. ) 30 tablet 1  . potassium chloride  SA (KLOR-CON M20) 20 MEQ tablet Take 1 tablet (20 mEq total) by mouth daily. 30 tablet 2  . prochlorperazine (COMPAZINE) 10 MG tablet Take 1 tablet (10 mg total) by mouth every 6 (six) hours as needed (Nausea or vomiting). 30 tablet 1  . senna (SENOKOT) 8.6 MG TABS tablet Take 2 tablets (17.2 mg total) by mouth daily. 120 each 0  . tamsulosin (FLOMAX) 0.4 MG CAPS capsule Take 0.4 mg by mouth daily.    . benzonatate (TESSALON) 200 MG capsule TAKE 1 CAPSULE BY MOUTH EVERY DAY 3 TIMES A DAY AS NEEDED FOR COUGH  0  . zolpidem (AMBIEN) 5 MG tablet Take 1 tablet (5 mg total) by mouth at bedtime as needed for sleep. (Patient not taking: Reported on 04/22/2018) 30 tablet 0   No current facility-administered medications for this visit.    Facility-Administered Medications Ordered in Other Visits  Medication Dose Route Frequency Provider Last Rate Last Dose  . sodium chloride flush (NS) 0.9 % injection 10 mL  10 mL Intravenous PRN [name], MD   10 mL at 03/18/18 0858  . sodium chloride flush (NS) 0.9 % injection 10 mL  10 mL Intravenous PRN [name], MD   10 mL at 07/04/18 0935     PHYSICAL EXAMINATION: ECOG PERFORMANCE STATUS: 0 - Asymptomatic Vitals:   07/04/18 0941  BP: 125/75  Pulse: 74  Resp: 18  Temp: (!) 96.8 F (36 C)  SpO2: 95%   Filed Weights   07/04/18 0941  Weight: 212 lb 6.4 oz (96.3 kg)    Physical Exam Constitutional:       General: He is not in acute distress. [name]:     Head: Normocephalic and atraumatic.  Eyes:     General: No scleral icterus.    Conjunctiva/sclera: Conjunctivae normal.     Pupils: Pupils are equal, round, and reactive to light.  Neck:     Musculoskeletal: Normal range of motion and neck supple.  Cardiovascular:     Rate and Rhythm: Normal rate and regular rhythm.     Heart sounds: Normal heart sounds.  Pulmonary:     Effort: Pulmonary effort is normal. No respiratory distress.     Breath sounds: No wheezing or rales.     Comments: Decreased breath sound bilaterally Chest:     Chest wall: No tenderness.  Abdominal:     General: Bowel sounds are normal. There is no distension.     Palpations: Abdomen is soft. There is no mass.     Tenderness: There is no abdominal tenderness.  Musculoskeletal: Normal range of motion.        General: No deformity.  Lymphadenopathy:     Cervical: No cervical adenopathy.  Skin:    General: Skin is warm and dry.     Findings: No erythema or rash.  Neurological:     Mental Status: He is alert and oriented to person, place, and time.     Cranial Nerves: No cranial nerve deficit.     Coordination: Coordination normal.  Psychiatric:        Behavior: Behavior normal.        Thought Content: Thought content normal.      LABORATORY DATA:  I have reviewed the data as listed Lab Results  Component Value Date   WBC 8.2 07/04/2018   HGB 14.3 07/04/2018   HCT 42.7 07/04/2018   MCV 86.1 07/04/2018   PLT 205 07/04/2018   Recent Labs    06/04/18 0826  06/18/18 0836 07/04/18 0926  NA 141 140 139  K 3.0* 3.6 3.5  CL 101 103 103  CO2 28 28 28   GLUCOSE 127* 125* 117*  BUN 13 14 13   CREATININE 0.79 0.87 1.02  CALCIUM 9.2 9.4 9.0  GFRNONAA >60 >60 >60  GFRAA >60 >60 >60  PROT 7.4 7.3 7.1  ALBUMIN 3.9 3.9 3.8  AST 20 23 28   ALT 13 16 20   ALKPHOS 56 57 60  BILITOT 0.5 0.5 0.6   RADIOGRAPHIC STUDIES: I have personally reviewed the  radiological images as listed and agreed with the findings in the report.  03/21/2018 CT chest showed mild decreasing size of the right upper lobe pulmonary nodule.  Interval decrease in size of right paratracheal and right hilar adenopathy.  06/30/2018 CT chest showed interval development of extensive changes secondary to external beam radiation within the posterior right upper lobe and superior segment of right lower lobe.  The enlarged right paratracheal lymph node is decreased in size in the interval.  Right upper lobe lung lesion is difficult to separate from surrounding changes due to radiation but is favored to have decrease in size in the interval.  Aortic atherosclerosis and emphysema.  LAD coronary artery atherosclerotic calcification.   ASSESSMENT & PLAN:  1. Primary lung adenocarcinoma, right ([place])   2. Encounter for antineoplastic immunotherapy   3. Port-A-Cath in place   4. Supraclavicular fossa fullness   T3 N1 M0 lung adenocarcinoma, s/p concurrent chemoradiation.   #Stage III lung adenocarcinoma, Interval CT chest was independently reviewed and discussed with patient.  Stable disease.  Development of radiation changes. Tolerating immunotherapy well.  No clinical signs of immunotherapy related toxicities. Labs are reviewed and discussed with patient.  Counts acceptable to proceed with today's maintenance durvalumab. Plan total of 26 treatments. TSH has been monitored and has been stable.  #Hypokalemia, potassium stable at 3.5 today.  Advised patient to continue take daily potassium supplements. #Supra clavicle area puffiness, no palpable lymph nodes.  Continue to observe. Return of visit: 2 weeks   [name], MD, PhD Hematology Oncology [facility] at [facility] Pager- [phone] 07/04/2018

## 2018-07-04 NOTE — Progress Notes (Signed)
Patient here for follow up. Complains of puffiness to left side neck/shoulder.

## 2018-07-22 ENCOUNTER — Inpatient Hospital Stay (HOSPITAL_BASED_OUTPATIENT_CLINIC_OR_DEPARTMENT_OTHER): Payer: Medicare Other | Admitting: Oncology

## 2018-07-22 ENCOUNTER — Inpatient Hospital Stay: Payer: Medicare Other

## 2018-07-22 ENCOUNTER — Encounter: Payer: Self-pay | Admitting: Oncology

## 2018-07-22 ENCOUNTER — Other Ambulatory Visit: Payer: Self-pay

## 2018-07-22 VITALS — BP 160/82 | HR 74 | Temp 96.0°F | Resp 18 | Wt 217.1 lb

## 2018-07-22 DIAGNOSIS — R05 Cough: Secondary | ICD-10-CM | POA: Diagnosis not present

## 2018-07-22 DIAGNOSIS — C3491 Malignant neoplasm of unspecified part of right bronchus or lung: Secondary | ICD-10-CM

## 2018-07-22 DIAGNOSIS — E876 Hypokalemia: Secondary | ICD-10-CM | POA: Diagnosis not present

## 2018-07-22 DIAGNOSIS — R0602 Shortness of breath: Secondary | ICD-10-CM | POA: Diagnosis not present

## 2018-07-22 DIAGNOSIS — Z5112 Encounter for antineoplastic immunotherapy: Secondary | ICD-10-CM | POA: Diagnosis not present

## 2018-07-22 DIAGNOSIS — Z95828 Presence of other vascular implants and grafts: Secondary | ICD-10-CM

## 2018-07-22 DIAGNOSIS — Z87891 Personal history of nicotine dependence: Secondary | ICD-10-CM

## 2018-07-22 LAB — COMPREHENSIVE METABOLIC PANEL
ALK PHOS: 63 U/L (ref 38–126)
ALT: 15 U/L (ref 0–44)
ANION GAP: 9 (ref 5–15)
AST: 22 U/L (ref 15–41)
Albumin: 4 g/dL (ref 3.5–5.0)
BUN: 11 mg/dL (ref 8–23)
CO2: 27 mmol/L (ref 22–32)
Calcium: 9 mg/dL (ref 8.9–10.3)
Chloride: 102 mmol/L (ref 98–111)
Creatinine, Ser: 0.9 mg/dL (ref 0.61–1.24)
GFR calc Af Amer: >60 mL/min (ref >60)
GFR calc non Af Amer: >60 mL/min (ref >60)
Glucose, Bld: 172 mg/dL — ABNORMAL HIGH (ref 70–99)
Potassium: 3 mmol/L — ABNORMAL LOW (ref 3.5–5.1)
Sodium: 138 mmol/L (ref 135–145)
Total Bilirubin: 0.4 mg/dL (ref 0.3–1.2)
Total Protein: 7.4 g/dL (ref 6.5–8.1)

## 2018-07-22 LAB — CBC WITH DIFFERENTIAL/PLATELET
Abs Immature Granulocytes: 0.01 10*3/uL (ref 0.00–0.07)
Basophils Absolute: 0 10*3/uL (ref 0.0–0.1)
Basophils Relative: 0 %
Eosinophils Absolute: 0.1 10*3/uL (ref 0.0–0.5)
Eosinophils Relative: 1 %
HEMATOCRIT: 41.6 % (ref 39.0–52.0)
Hemoglobin: 13.8 g/dL (ref 13.0–17.0)
IMMATURE GRANULOCYTES: 0 %
Lymphocytes Relative: 11 %
Lymphs Abs: 0.7 10*3/uL (ref 0.7–4.0)
MCH: 28.2 pg (ref 26.0–34.0)
MCHC: 33.2 g/dL (ref 30.0–36.0)
MCV: 84.9 fL (ref 80.0–100.0)
Monocytes Absolute: 0.6 10*3/uL (ref 0.1–1.0)
Monocytes Relative: 9 %
NEUTROS PCT: 79 %
Neutro Abs: 5.1 10*3/uL (ref 1.7–7.7)
Platelets: 281 10*3/uL (ref 150–400)
RBC: 4.9 MIL/uL (ref 4.22–5.81)
RDW: 14.5 % (ref 11.5–15.5)
WBC: 6.5 10*3/uL (ref 4.0–10.5)
nRBC: 0 % (ref 0.0–0.2)

## 2018-07-22 LAB — TSH: TSH: 2.119 u[IU]/mL (ref 0.350–4.500)

## 2018-07-22 MED ORDER — HEPARIN SOD (PORK) LOCK FLUSH 100 UNIT/ML IV SOLN
500.0000 [IU] | Freq: Once | INTRAVENOUS | Status: AC | PRN
  Administered 2018-07-22: 500 [IU]
  Filled 2018-07-22: qty 5

## 2018-07-22 MED ORDER — SODIUM CHLORIDE 0.9 % IV SOLN
Freq: Once | INTRAVENOUS | Status: AC
  Administered 2018-07-22: 10:00:00 via INTRAVENOUS
  Filled 2018-07-22: qty 250

## 2018-07-22 MED ORDER — POTASSIUM CHLORIDE 20 MEQ/100ML IV SOLN
20.0000 meq | Freq: Once | INTRAVENOUS | Status: AC
  Administered 2018-07-22: 20 meq via INTRAVENOUS

## 2018-07-22 MED ORDER — SODIUM CHLORIDE 0.9 % IV SOLN
10.0000 mg/kg | Freq: Once | INTRAVENOUS | Status: AC
  Administered 2018-07-22: 960 mg via INTRAVENOUS
  Filled 2018-07-22: qty 10

## 2018-07-22 NOTE — Progress Notes (Signed)
Per Dr Tasia Catchings may administer 20 mEq of Potassium over one hour. Patient has a port

## 2018-07-22 NOTE — Progress Notes (Signed)
Hematology/Oncology Follow up note [facility] [phone] [phone]   Mark Mccarty Care Team: [name], MD as PCP - General (Internal Medicine) [name], RN as Registered Nurse   REASON FOR VISIT Follow up for treatment of Stage III lung adenocarcinoma  Oncology History:  Mark Mccarty is a  68 y.o.  male with  adenocarcinoma of lung, diagnosed via biopsy of right hilar lymph nodes.  Former 43 pack year smoking history.  Presents for assessment prior to concurrent chemo T3 N1 M0  INTERVAL HISTORY Mark Mccarty is a 68 y.o. male who has above history reviewed by me presents for assessment prior to immunotherapy with durvalumab for treatment of stage III non-small cell lung cancer. Mark Mccarty reports doing well.  Tolerating immunotherapy well.  Denies any skin rash, diarrhea, abdominal pain, shortness of breath more than his baseline. Chronic cough is at baseline. He takes potassium chloride 20 mEq daily.  Review of Systems  Constitutional: Negative for appetite change, chills, fatigue, fever and unexpected weight change.  [name]:   Negative for hearing loss and voice change.   Eyes: Negative for eye problems and icterus.  Respiratory: Positive for cough and shortness of breath. Negative for chest tightness.   Cardiovascular: Negative for chest pain and leg swelling.  Gastrointestinal: Negative for abdominal distention and abdominal pain.  Endocrine: Negative for hot flashes.  Genitourinary: Negative for difficulty urinating, dysuria and frequency.   Musculoskeletal: Negative for arthralgias.  Skin: Negative for itching and rash.  Neurological: Negative for light-headedness and numbness.  [name]: Negative for adenopathy. Does not bruise/bleed easily.  Psychiatric/Behavioral: Negative for confusion.     MEDICAL HISTORY:  Past Medical History:  Diagnosis Date  . Asthma   . Cancer ([place])    lung  . Cataract    left  cataract surgery  . Dyspnea   . [name]   . GERD (gastroesophageal reflux disease)   . Hypertension   . Neuromuscular disorder ([place])    pt has knees bilateral with tendon issues that cause pain    SURGICAL HISTORY: Past Surgical History:  Procedure Laterality Date  . CARDIAC CATHETERIZATION N/A 12/16/2014   Procedure: Left Heart Cath;  Surgeon: [name], MD;  Location: [place] [facility];  Service: Cardiovascular;  Laterality: N/A;  . CYST REMOVAL TRUNK     chest and back over time and it was removed  . ENDOBRONCHIAL ULTRASOUND N/A 12/09/2017   Procedure: ENDOBRONCHIAL ULTRASOUND;  Surgeon: [name], MD;  Location: [name];  Service: Pulmonary;  Laterality: N/A;  . left cataract surgery    . PORTA CATH INSERTION N/A 12/23/2017   Procedure: PORTA CATH INSERTION;  Surgeon: [name], MD;  Location: [place] [facility];  Service: Cardiovascular;  Laterality: N/A;    SOCIAL HISTORY: Social History   Socioeconomic History  . Marital status: Divorced    Spouse name: Not on file  . Number of children: Not on file  . Years of education: Not on file  . Highest education level: Not on file  Occupational History  . Not on file  Social Needs  . Financial resource strain: Not on file  . Food insecurity:    Worry: Not on file    Inability: Not on file  . Transportation needs:    Medical: Not on file    Non-medical: Not on file  Tobacco Use  . Smoking status: Former Smoker    Packs/day: 1.50    Years: 35.00    Pack  years: 52.50    Types: Cigarettes    Last attempt to quit: 06/22/2015    Years since quitting: 3.0  . Smokeless tobacco: Never Used  Substance and Sexual Activity  . Alcohol use: Yes    Comment: a fifth in a week  . Drug use: No  . Sexual activity: Not Currently  Lifestyle  . Physical activity:    Days per week: Not on file    Minutes per session: Not on file  . Stress: Not on file  Relationships  . Social connections:     Talks on phone: Not on file    Gets together: Not on file    Attends religious service: Not on file    Active member of club or organization: Not on file    Attends meetings of clubs or organizations: Not on file    Relationship status: Not on file  . Intimate partner violence:    Fear of current or ex partner: Not on file    Emotionally abused: Not on file    Physically abused: Not on file    Forced sexual activity: Not on file  Other Topics Concern  . Not on file  Social History Narrative  . Not on file    FAMILY HISTORY: Family History  Problem Relation Age of Onset  . Hypertension Mother   . Breast cancer Mother   . Heart attack Mother   . Lung cancer Father   . Heart disease Sister   . Diabetes Sister   . Colon cancer Sister     ALLERGIES:  is allergic to penicillins.  MEDICATIONS:  Current Outpatient Medications  Medication Sig Dispense Refill  . acetaminophen (TYLENOL) 500 MG tablet Take 500 mg by mouth as needed.    [name] albuterol (PROVENTIL HFA;VENTOLIN HFA) 108 (90 Base) MCG/ACT inhaler Inhale 2 puffs into the lungs every 6 (six) hours as needed.  12  . amLODipine (NORVASC) 10 MG tablet Take 10 mg by mouth daily.    Jearl Klinefelter ELLIPTA 62.5-25 MCG/INH AEPB TAKE 1 PUFF BY MOUTH EVERY DAY 180 each 3  . aspirin 81 MG tablet Take 81 mg by mouth daily.    . carvedilol (COREG) 25 MG tablet Take 25 mg by mouth 2 (two) times daily with a meal.    . fluticasone (FLONASE) 50 MCG/ACT nasal spray PLACE 1 SPRAY INTO BOTH NOSTRILS 2 (TWO) TIMES DAILY  0  . gabapentin (NEURONTIN) 300 MG capsule Take 300 mg by mouth at bedtime as needed.     [name] ibuprofen (ADVIL,MOTRIN) 600 MG tablet Take 600 mg by mouth every 6 (six) hours as needed.    . lidocaine-prilocaine (EMLA) cream Apply to affected area once 30 g 3  . lisinopril (PRINIVIL,ZESTRIL) 40 MG tablet Take 40 mg by mouth daily.    . mupirocin ointment (BACTROBAN) 2 % Place 1 application into the nose 3 (three) times daily. 15 g 0  .  omeprazole (PRILOSEC) 20 MG capsule Take 20 mg by mouth 2 (two) times daily before a meal.    . ondansetron (ZOFRAN) 8 MG tablet Take 1 tablet (8 mg total) by mouth 2 (two) times daily as needed for refractory nausea / vomiting. (Mark Mccarty taking differently: Take 8 mg by mouth 2 (two) times daily as needed for refractory nausea / vomiting. ) 30 tablet 1  . potassium chloride SA (KLOR-CON M20) 20 MEQ tablet Take 1 tablet (20 mEq total) by mouth daily. 30 tablet 2  . prochlorperazine (COMPAZINE) 10 MG  tablet Take 1 tablet (10 mg total) by mouth every 6 (six) hours as needed (Nausea or vomiting). 30 tablet 1  . senna (SENOKOT) 8.6 MG TABS tablet Take 2 tablets (17.2 mg total) by mouth daily. 120 each 0  . tamsulosin (FLOMAX) 0.4 MG CAPS capsule Take 0.4 mg by mouth daily.    . benzonatate (TESSALON) 200 MG capsule TAKE 1 CAPSULE BY MOUTH EVERY DAY 3 TIMES A DAY AS NEEDED FOR COUGH  0  . zolpidem (AMBIEN) 5 MG tablet Take 1 tablet (5 mg total) by mouth at bedtime as needed for sleep. (Mark Mccarty not taking: Reported on 04/22/2018) 30 tablet 0   No current facility-administered medications for this visit.    Facility-Administered Medications Ordered in Other Visits  Medication Dose Route Frequency Provider Last Rate Last Dose  . sodium chloride flush (NS) 0.9 % injection 10 mL  10 mL Intravenous PRN [name], MD   10 mL at 03/18/18 0858     PHYSICAL EXAMINATION: ECOG PERFORMANCE STATUS: 0 - Asymptomatic Vitals:   07/22/18 0850  BP: (!) 160/82  Pulse: 74  Resp: 18  Temp: (!) 96 F (35.6 C)  SpO2: 95%   Filed Weights   07/22/18 0850  Weight: 217 lb 1.6 oz (98.5 kg)    Physical Exam Constitutional:      General: He is not in acute distress. [name]:     Head: Normocephalic and atraumatic.  Eyes:     General: No scleral icterus.    Conjunctiva/sclera: Conjunctivae normal.     Pupils: Pupils are equal, round, and reactive to light.  Neck:     Musculoskeletal: Normal range of motion and neck  supple.  Cardiovascular:     Rate and Rhythm: Normal rate and regular rhythm.     Heart sounds: Normal heart sounds.  Pulmonary:     Effort: Pulmonary effort is normal. No respiratory distress.     Breath sounds: No wheezing or rales.     Comments: Decreased breath sound bilaterally Chest:     Chest wall: No tenderness.  Abdominal:     General: Bowel sounds are normal. There is no distension.     Palpations: Abdomen is soft. There is no mass.     Tenderness: There is no abdominal tenderness.  Musculoskeletal: Normal range of motion.        General: No deformity.  Lymphadenopathy:     Cervical: No cervical adenopathy.  Skin:    General: Skin is warm and dry.     Findings: No erythema or rash.  Neurological:     Mental Status: He is alert and oriented to person, place, and time.     Cranial Nerves: No cranial nerve deficit.     Coordination: Coordination normal.  Psychiatric:        Behavior: Behavior normal.        Thought Content: Thought content normal.      LABORATORY DATA:  I have reviewed the data as listed Lab Results  Component Value Date   WBC 6.5 07/22/2018   HGB 13.8 07/22/2018   HCT 41.6 07/22/2018   MCV 84.9 07/22/2018   PLT 281 07/22/2018   Recent Labs    06/18/18 0836 07/04/18 0926 07/22/18 0827  NA 140 139 138  K 3.6 3.5 3.0*  CL 103 103 102  CO2 28 28 27   GLUCOSE 125* 117* 172*  BUN 14 13 11   CREATININE 0.87 1.02 0.90  CALCIUM 9.4 9.0 9.0  GFRNONAA >60 >60 >60  GFRAA >60 >60 >60  PROT 7.3 7.1 7.4  ALBUMIN 3.9 3.8 4.0  AST 23 28 22   ALT 16 20 15   ALKPHOS 57 60 63  BILITOT 0.5 0.6 0.4   RADIOGRAPHIC STUDIES: I have personally reviewed the radiological images as listed and agreed with the findings in the report.  03/21/2018 CT chest showed mild decreasing size of the right upper lobe pulmonary nodule.  Interval decrease in size of right paratracheal and right hilar adenopathy.  06/30/2018 CT chest showed interval development of extensive  changes secondary to external beam radiation within the posterior right upper lobe and superior segment of right lower lobe.  The enlarged right paratracheal lymph node is decreased in size in the interval.  Right upper lobe lung lesion is difficult to separate from surrounding changes due to radiation but is favored to have decrease in size in the interval.  Aortic atherosclerosis and emphysema.  LAD coronary artery atherosclerotic calcification.   ASSESSMENT & PLAN:  1. Primary lung adenocarcinoma, right ([place])   2. Encounter for antineoplastic immunotherapy   3. Port-A-Cath in place   4. Hypokalemia   T3 N1 M0 lung adenocarcinoma, s/p concurrent chemoradiation.   #Stage III lung adenocarcinoma, Tolerating immunotherapy very well.  No clinical signs of immunotherapy related toxicities. Labs are reviewed and discussed with Mark Mccarty.  Counts acceptable to proceed with today's maintenance durvalumab. Plan total of 26 treatment. TSH has been monitored and has been stable.  #Hypokalemia, chronic issue for Mark Mccarty.  Potassium is 3.0 today.  Advised Mark Mccarty to take potassium chloride 20 mEq 2 tablets daily.  He voices understanding.  Return of visit: 2 weeks for evaluation prior to next durvalumab treatment. Total face to face encounter time for this Mark Mccarty visit was 25 min. >50% of the time was  spent in counseling and coordination of care.   [name], MD, PhD Hematology Oncology [facility] at [facility] Pager- [phone] 07/22/2018

## 2018-07-22 NOTE — Progress Notes (Signed)
Patient here for follow up. No concerns voiced.  °

## 2018-07-23 ENCOUNTER — Encounter: Payer: Self-pay | Admitting: Radiation Oncology

## 2018-07-23 ENCOUNTER — Other Ambulatory Visit: Payer: Self-pay

## 2018-07-23 ENCOUNTER — Ambulatory Visit
Admission: RE | Admit: 2018-07-23 | Discharge: 2018-07-23 | Disposition: A | Discharge status: Home / Self Care | Payer: Medicare Other | Source: Ambulatory Visit | Attending: Radiation Oncology | Admitting: Radiation Oncology

## 2018-07-23 VITALS — BP 146/87 | HR 74 | Temp 97.4°F | Resp 18 | Wt 214.4 lb

## 2018-07-23 DIAGNOSIS — Z9221 Personal history of antineoplastic chemotherapy: Secondary | ICD-10-CM | POA: Insufficient documentation

## 2018-07-23 DIAGNOSIS — R599 Enlarged lymph nodes, unspecified: Secondary | ICD-10-CM | POA: Diagnosis not present

## 2018-07-23 DIAGNOSIS — Z923 Personal history of irradiation: Secondary | ICD-10-CM | POA: Diagnosis not present

## 2018-07-23 DIAGNOSIS — C3491 Malignant neoplasm of unspecified part of right bronchus or lung: Secondary | ICD-10-CM | POA: Insufficient documentation

## 2018-07-23 NOTE — Progress Notes (Signed)
Radiation Oncology Follow up Note  Name: Mark Mccarty   Date:   07/23/2018 MRN:  [id number] DOB: [birth year]    This 68 y.o. male presents to the clinic today for 5 month follow-up status post concurrent chemoradiation for stage IIIa adenocarcinoma the right lung.[name]  REFERRING PROVIDER: [name], MD  HPI: patient is a 68 year old male now out 5 months having completed concurrent chemoradiation for stage IIIa (T3 N1 M0) adenocarcinoma the right lung. Seen today in routine follow-up he is doing well. He specifically denies cough hemoptysis or chest tightness. He's been putting on weight. He had a recent CT scan.in December showing extensive changes secondary to external beam radiation in the posterior right upper lobe and superior segment of the right lower lobe. The enlarged right paratracheal lymph nodes had decreased in size.he is currently on maintenancedurvalumab which she is tolerating well.  COMPLICATIONS OF TREATMENT: none  FOLLOW UP COMPLIANCE: keeps appointments   PHYSICAL EXAM:  BP (!) 146/87   Pulse 74   Temp (!) 97.4 F (36.3 C)   Resp 18   Wt 214 lb 6.4 oz (97.3 kg)   BMI 29.08 kg/m  Well-developed well-nourished patient in NAD. HEENT reveals PERLA, EOMI, discs not visualized.  Oral cavity is clear. No oral mucosal lesions are identified. Neck is clear without evidence of cervical or supraclavicular adenopathy. Lungs are clear to A&P. Cardiac examination is essentially unremarkable with regular rate and rhythm without murmur rub or thrill. Abdomen is benign with no organomegaly or masses noted. Motor sensory and DTR levels are equal and symmetric in the upper and lower extremities. Cranial nerves II through XII are grossly intact. Proprioception is intact. No peripheral adenopathy or edema is identified. No motor or sensory levels are noted. Crude visual fields are within normal range.  RADIOLOGY RESULTS: CT scans reviewed and compatible with the above-stated  findings.  PLAN: present time patient is stable undergoing maintenance immunotherapy withdurvalumab . Present time I'm please was overall progress. He has very low side effect profile. I have asked to see him back in 6 months for follow-up. Patient knows to call sooner with any concerns.  I would like to take this opportunity to thank you for allowing me to participate in the care of your patient.[name], MD

## 2018-08-05 ENCOUNTER — Encounter: Payer: Self-pay | Admitting: Oncology

## 2018-08-05 ENCOUNTER — Inpatient Hospital Stay: Payer: Medicare Other

## 2018-08-05 ENCOUNTER — Other Ambulatory Visit: Payer: Self-pay

## 2018-08-05 ENCOUNTER — Inpatient Hospital Stay: Payer: Medicare Other | Attending: Oncology

## 2018-08-05 ENCOUNTER — Inpatient Hospital Stay (HOSPITAL_BASED_OUTPATIENT_CLINIC_OR_DEPARTMENT_OTHER): Payer: Medicare Other | Admitting: Oncology

## 2018-08-05 VITALS — BP 143/77 | HR 74 | Temp 96.8°F | Resp 18 | Wt 214.3 lb

## 2018-08-05 DIAGNOSIS — Z79899 Other long term (current) drug therapy: Secondary | ICD-10-CM | POA: Diagnosis not present

## 2018-08-05 DIAGNOSIS — Z87891 Personal history of nicotine dependence: Secondary | ICD-10-CM

## 2018-08-05 DIAGNOSIS — C3491 Malignant neoplasm of unspecified part of right bronchus or lung: Secondary | ICD-10-CM

## 2018-08-05 DIAGNOSIS — Z5112 Encounter for antineoplastic immunotherapy: Secondary | ICD-10-CM | POA: Insufficient documentation

## 2018-08-05 DIAGNOSIS — E876 Hypokalemia: Secondary | ICD-10-CM | POA: Insufficient documentation

## 2018-08-05 DIAGNOSIS — R0602 Shortness of breath: Secondary | ICD-10-CM | POA: Diagnosis not present

## 2018-08-05 DIAGNOSIS — R05 Cough: Secondary | ICD-10-CM

## 2018-08-05 LAB — COMPREHENSIVE METABOLIC PANEL
ALT: 15 U/L (ref 0–44)
AST: 21 U/L (ref 15–41)
Albumin: 3.8 g/dL (ref 3.5–5.0)
Alkaline Phosphatase: 66 U/L (ref 38–126)
Anion gap: 7 (ref 5–15)
BUN: 12 mg/dL (ref 8–23)
CALCIUM: 8.9 mg/dL (ref 8.9–10.3)
CO2: 28 mmol/L (ref 22–32)
CREATININE: 0.93 mg/dL (ref 0.61–1.24)
Chloride: 105 mmol/L (ref 98–111)
GFR calc Af Amer: >60 mL/min (ref >60)
GFR calc non Af Amer: >60 mL/min (ref >60)
Glucose, Bld: 153 mg/dL — ABNORMAL HIGH (ref 70–99)
Potassium: 3.2 mmol/L — ABNORMAL LOW (ref 3.5–5.1)
Sodium: 140 mmol/L (ref 135–145)
Total Bilirubin: 0.7 mg/dL (ref 0.3–1.2)
Total Protein: 7.4 g/dL (ref 6.5–8.1)

## 2018-08-05 LAB — CBC WITH DIFFERENTIAL/PLATELET
Abs Immature Granulocytes: 0.01 10*3/uL (ref 0.00–0.07)
Basophils Absolute: 0 10*3/uL (ref 0.0–0.1)
Basophils Relative: 0 %
Eosinophils Absolute: 0.1 10*3/uL (ref 0.0–0.5)
Eosinophils Relative: 1 %
HCT: 41.7 % (ref 39.0–52.0)
Hemoglobin: 14.1 g/dL (ref 13.0–17.0)
Immature Granulocytes: 0 %
LYMPHS ABS: 0.7 10*3/uL (ref 0.7–4.0)
Lymphocytes Relative: 11 %
MCH: 28.7 pg (ref 26.0–34.0)
MCHC: 33.8 g/dL (ref 30.0–36.0)
MCV: 84.8 fL (ref 80.0–100.0)
Monocytes Absolute: 0.6 10*3/uL (ref 0.1–1.0)
Monocytes Relative: 9 %
Neutro Abs: 4.8 10*3/uL (ref 1.7–7.7)
Neutrophils Relative %: 79 %
Platelets: 249 10*3/uL (ref 150–400)
RBC: 4.92 MIL/uL (ref 4.22–5.81)
RDW: 14.8 % (ref 11.5–15.5)
WBC: 6.1 10*3/uL (ref 4.0–10.5)
nRBC: 0 % (ref 0.0–0.2)

## 2018-08-05 MED ORDER — POTASSIUM CHLORIDE CRYS ER 20 MEQ PO TBCR: 40.0000 meq | EXTENDED_RELEASE_TABLET | Freq: Every day | ORAL | 2 refills | Status: DC

## 2018-08-05 MED ORDER — SODIUM CHLORIDE 0.9% FLUSH
10.0000 mL | Freq: Once | INTRAVENOUS | Status: AC
  Administered 2018-08-05: 10 mL via INTRAVENOUS
  Filled 2018-08-05: qty 10

## 2018-08-05 MED ORDER — SODIUM CHLORIDE 0.9 % IV SOLN
10.0000 mg/kg | Freq: Once | INTRAVENOUS | Status: AC
  Administered 2018-08-05: 960 mg via INTRAVENOUS
  Filled 2018-08-05: qty 19.2

## 2018-08-05 MED ORDER — HEPARIN SOD (PORK) LOCK FLUSH 100 UNIT/ML IV SOLN
500.0000 [IU] | Freq: Once | INTRAVENOUS | Status: AC
  Administered 2018-08-05: 500 [IU] via INTRAVENOUS
  Filled 2018-08-05: qty 5

## 2018-08-05 MED ORDER — SODIUM CHLORIDE 0.9 % IV SOLN
Freq: Once | INTRAVENOUS | Status: AC
  Administered 2018-08-05: 10:00:00 via INTRAVENOUS
  Filled 2018-08-05: qty 250

## 2018-08-05 NOTE — Progress Notes (Signed)
Pt here for follow up. No concerns voiced.

## 2018-08-05 NOTE — Progress Notes (Signed)
Hematology/Oncology Follow up note [facility] Telephone:([phone] Fax:(336) [phone]   Patient Care Team: [name], MD as PCP - General (Internal Medicine) [name], RN as Registered Nurse   REASON FOR VISIT Follow up for treatment of Stage III lung adenocarcinoma  Oncology History:  Mark Mccarty is a  68 y.o.  male with  adenocarcinoma of lung, diagnosed via biopsy of right hilar lymph nodes.  Former 43 pack year smoking history.  Presents for assessment prior to concurrent chemo T3 N1 M0  INTERVAL HISTORY Mark Mccarty is a 67 y.o. male who has above history reviewed by me presents for assessment prior to immunotherapy with durvalumab for treatment of stage III non-small cell lung cancer. Reports doing well.  Tolerate immunotherapy well. Denies any skin rash, diarrhea, abdominal pain, shortness of breath more than his baseline. Chronic cough unchanged. He takes potassium chloride 40 mEq daily. He tells me that he is going to need some dental work done and need a letter for clearance. Review of Systems  Constitutional: Negative for appetite change, chills, fatigue, fever and unexpected weight change.  [name]:   Negative for hearing loss and voice change.   Eyes: Negative for eye problems and icterus.  Respiratory: Positive for cough and shortness of breath. Negative for chest tightness.   Cardiovascular: Negative for chest pain and leg swelling.  Gastrointestinal: Negative for abdominal distention and abdominal pain.  Endocrine: Negative for hot flashes.  Genitourinary: Negative for difficulty urinating, dysuria and frequency.   Musculoskeletal: Negative for arthralgias.  Skin: Negative for itching and rash.  Neurological: Negative for light-headedness and numbness.  [name]: Negative for adenopathy. Does not bruise/bleed easily.  Psychiatric/Behavioral: Negative for confusion.     MEDICAL HISTORY:  Past Medical History:    Diagnosis Date  . Asthma   . Cancer ([place])    lung  . Cataract    left cataract surgery  . Dyspnea   . [name]   . GERD (gastroesophageal reflux disease)   . Hypertension   . Neuromuscular disorder ([place])    pt has knees bilateral with tendon issues that cause pain    SURGICAL HISTORY: Past Surgical History:  Procedure Laterality Date  . CARDIAC CATHETERIZATION N/A 12/16/2014   Procedure: Left Heart Cath;  Surgeon: [name], MD;  Location: [place] [facility];  Service: Cardiovascular;  Laterality: N/A;  . CYST REMOVAL TRUNK     chest and back over time and it was removed  . ENDOBRONCHIAL ULTRASOUND N/A 12/09/2017   Procedure: ENDOBRONCHIAL ULTRASOUND;  Surgeon: [name], MD;  Location: [name];  Service: Pulmonary;  Laterality: N/A;  . left cataract surgery    . PORTA CATH INSERTION N/A 12/23/2017   Procedure: PORTA CATH INSERTION;  Surgeon: [name], MD;  Location: [place] [facility];  Service: Cardiovascular;  Laterality: N/A;    SOCIAL HISTORY: Social History   Socioeconomic History  . Marital status: Divorced    Spouse name: Not on file  . Number of children: Not on file  . Years of education: Not on file  . Highest education level: Not on file  Occupational History  . Not on file  Social Needs  . Financial resource strain: Not on file  . Food insecurity:    Worry: Not on file    Inability: Not on file  . Transportation needs:    Medical: Not on file    Non-medical: Not on file  Tobacco Use  . Smoking status: Former  Smoker    Packs/day: 1.50    Years: 35.00    Pack years: 52.50    Types: Cigarettes    Last attempt to quit: 06/22/2015    Years since quitting: 3.1  . Smokeless tobacco: Never Used  Substance and Sexual Activity  . Alcohol use: Yes    Comment: a fifth in a week  . Drug use: No  . Sexual activity: Not Currently  Lifestyle  . Physical activity:    Days per week: Not on file    Minutes per session: Not on  file  . Stress: Not on file  Relationships  . Social connections:    Talks on phone: Not on file    Gets together: Not on file    Attends religious service: Not on file    Active member of club or organization: Not on file    Attends meetings of clubs or organizations: Not on file    Relationship status: Not on file  . Intimate partner violence:    Fear of current or ex partner: Not on file    Emotionally abused: Not on file    Physically abused: Not on file    Forced sexual activity: Not on file  Other Topics Concern  . Not on file  Social History Narrative  . Not on file    FAMILY HISTORY: Family History  Problem Relation Age of Onset  . Hypertension Mother   . Breast cancer Mother   . Heart attack Mother   . Lung cancer Father   . Heart disease Sister   . Diabetes Sister   . Colon cancer Sister     ALLERGIES:  is allergic to penicillins.  MEDICATIONS:  Current Outpatient Medications  Medication Sig Dispense Refill  . acetaminophen (TYLENOL) 500 MG tablet Take 500 mg by mouth as needed.    [name] albuterol (PROVENTIL HFA;VENTOLIN HFA) 108 (90 Base) MCG/ACT inhaler Inhale 2 puffs into the lungs every 6 (six) hours as needed.  12  . amLODipine (NORVASC) 10 MG tablet Take 10 mg by mouth daily.    Jearl Klinefelter ELLIPTA 62.5-25 MCG/INH AEPB TAKE 1 PUFF BY MOUTH EVERY DAY 180 each 3  . aspirin 81 MG tablet Take 81 mg by mouth daily.    . benzonatate (TESSALON) 200 MG capsule TAKE 1 CAPSULE BY MOUTH EVERY DAY 3 TIMES A DAY AS NEEDED FOR COUGH  0  . carvedilol (COREG) 25 MG tablet Take 25 mg by mouth 2 (two) times daily with a meal.    . fluticasone (FLONASE) 50 MCG/ACT nasal spray PLACE 1 SPRAY INTO BOTH NOSTRILS 2 (TWO) TIMES DAILY  0  . gabapentin (NEURONTIN) 300 MG capsule Take 300 mg by mouth at bedtime as needed.     [name] ibuprofen (ADVIL,MOTRIN) 600 MG tablet Take 600 mg by mouth every 6 (six) hours as needed.    . lidocaine-prilocaine (EMLA) cream Apply to affected area once 30 g  3  . lisinopril (PRINIVIL,ZESTRIL) 40 MG tablet Take 40 mg by mouth daily.    . mupirocin ointment (BACTROBAN) 2 % Place 1 application into the nose 3 (three) times daily. 15 g 0  . omeprazole (PRILOSEC) 20 MG capsule Take 20 mg by mouth 2 (two) times daily before a meal.    . ondansetron (ZOFRAN) 8 MG tablet Take 1 tablet (8 mg total) by mouth 2 (two) times daily as needed for refractory nausea / vomiting. (Patient taking differently: Take 8 mg by mouth 2 (two) times daily  as needed for refractory nausea / vomiting. ) 30 tablet 1  . potassium chloride SA (KLOR-CON M20) 20 MEQ tablet Take 2 tablets (40 mEq total) by mouth daily. 60 tablet 2  . prochlorperazine (COMPAZINE) 10 MG tablet Take 1 tablet (10 mg total) by mouth every 6 (six) hours as needed (Nausea or vomiting). 30 tablet 1  . senna (SENOKOT) 8.6 MG TABS tablet Take 2 tablets (17.2 mg total) by mouth daily. 120 each 0  . tamsulosin (FLOMAX) 0.4 MG CAPS capsule Take 0.4 mg by mouth daily.    [name] zolpidem (AMBIEN) 5 MG tablet Take 1 tablet (5 mg total) by mouth at bedtime as needed for sleep. (Patient not taking: Reported on 04/22/2018) 30 tablet 0   No current facility-administered medications for this visit.    Facility-Administered Medications Ordered in Other Visits  Medication Dose Route Frequency Provider Last Rate Last Dose  . sodium chloride flush (NS) 0.9 % injection 10 mL  10 mL Intravenous PRN [name], MD   10 mL at 03/18/18 0858     PHYSICAL EXAMINATION: ECOG PERFORMANCE STATUS: 0 - Asymptomatic Vitals:   08/05/18 0856  BP: (!) 143/77  Pulse: 74  Resp: 18  Temp: (!) 96.8 F (36 C)   Filed Weights   08/05/18 0856  Weight: 214 lb 4.8 oz (97.2 kg)    Physical Exam Constitutional:      General: He is not in acute distress. [name]:     Head: Normocephalic and atraumatic.  Eyes:     General: No scleral icterus.    Conjunctiva/sclera: Conjunctivae normal.     Pupils: Pupils are equal, round, and reactive to light.    Neck:     Musculoskeletal: Normal range of motion and neck supple.  Cardiovascular:     Rate and Rhythm: Normal rate and regular rhythm.     Heart sounds: Normal heart sounds.  Pulmonary:     Effort: Pulmonary effort is normal. No respiratory distress.     Breath sounds: No wheezing or rales.     Comments: Decreased breath sound bilaterally Chest:     Chest wall: No tenderness.  Abdominal:     General: Bowel sounds are normal. There is no distension.     Palpations: Abdomen is soft. There is no mass.     Tenderness: There is no abdominal tenderness.  Musculoskeletal: Normal range of motion.        General: No deformity.  Lymphadenopathy:     Cervical: No cervical adenopathy.  Skin:    General: Skin is warm and dry.     Findings: No erythema or rash.  Neurological:     Mental Status: He is alert and oriented to person, place, and time.     Cranial Nerves: No cranial nerve deficit.     Coordination: Coordination normal.  Psychiatric:        Behavior: Behavior normal.        Thought Content: Thought content normal.      LABORATORY DATA:  I have reviewed the data as listed Lab Results  Component Value Date   WBC 6.1 08/05/2018   HGB 14.1 08/05/2018   HCT 41.7 08/05/2018   MCV 84.8 08/05/2018   PLT 249 08/05/2018   Recent Labs    07/04/18 0926 07/22/18 0827 08/05/18 0841  NA 139 138 140  K 3.5 3.0* 3.2*  CL 103 102 105  CO2 28 27 28   GLUCOSE 117* 172* 153*  BUN 13 11 12   CREATININE  1.02 0.90 0.93  CALCIUM 9.0 9.0 8.9  GFRNONAA >60 >60 >60  GFRAA >60 >60 >60  PROT 7.1 7.4 7.4  ALBUMIN 3.8 4.0 3.8  AST 28 22 21   ALT 20 15 15   ALKPHOS 60 63 66  BILITOT 0.6 0.4 0.7   RADIOGRAPHIC STUDIES: I have personally reviewed the radiological images as listed and agreed with the findings in the report.  03/21/2018 CT chest showed mild decreasing size of the right upper lobe pulmonary nodule.  Interval decrease in size of right paratracheal and right hilar  adenopathy.  06/30/2018 CT chest showed interval development of extensive changes secondary to external beam radiation within the posterior right upper lobe and superior segment of right lower lobe.  The enlarged right paratracheal lymph node is decreased in size in the interval.  Right upper lobe lung lesion is difficult to separate from surrounding changes due to radiation but is favored to have decrease in size in the interval.  Aortic atherosclerosis and emphysema.  LAD coronary artery atherosclerotic calcification.   ASSESSMENT & PLAN:  1. Primary lung adenocarcinoma, right ([place])   2. Hypokalemia   3. Encounter for antineoplastic immunotherapy   T3 N1 M0 lung adenocarcinoma, s/p concurrent chemoradiation.   #Stage III lung adenocarcinoma, Tolerating immunotherapy very well.  No clinical signs of immunotherapy related toxicities. Labs are reviewed and discussed with patient. Counts acceptable to proceed with today's maintenance durvalumab. TSH has been monitored and has been stable.  #Hypokalemia, chronic issue for patient.  Potassium is 3.2 today.  Advised patient to continue take potassium chloride 40 mEq daily. I sent him a new prescription with refills.  Return of visit: 2 weeks for evaluation prior to next durvalumab treatment. We spent sufficient time to discuss many aspect of care, questions were answered to patient's satisfaction. Total face to face encounter time for this patient visit was 25 min. >50% of the time was  spent in counseling and coordination of care.    [name], MD, PhD Hematology Oncology [facility] Pager- [phone] 08/05/2018

## 2018-08-06 ENCOUNTER — Emergency Department
Admission: EM | Admit: 2018-08-06 | Discharge: 2018-08-06 | Disposition: A | Discharge status: Home / Self Care | Payer: Medicare Other | Attending: Emergency Medicine | Admitting: Emergency Medicine

## 2018-08-06 ENCOUNTER — Other Ambulatory Visit: Payer: Self-pay

## 2018-08-06 ENCOUNTER — Encounter: Payer: Self-pay | Admitting: Emergency Medicine

## 2018-08-06 ENCOUNTER — Emergency Department: Payer: Medicare Other

## 2018-08-06 DIAGNOSIS — I10 Essential (primary) hypertension: Secondary | ICD-10-CM | POA: Insufficient documentation

## 2018-08-06 DIAGNOSIS — J45909 Unspecified asthma, uncomplicated: Secondary | ICD-10-CM | POA: Insufficient documentation

## 2018-08-06 DIAGNOSIS — Z85118 Personal history of other malignant neoplasm of bronchus and lung: Secondary | ICD-10-CM | POA: Diagnosis not present

## 2018-08-06 DIAGNOSIS — R519 Headache, unspecified: Secondary | ICD-10-CM

## 2018-08-06 DIAGNOSIS — R51 Headache: Secondary | ICD-10-CM | POA: Insufficient documentation

## 2018-08-06 DIAGNOSIS — Z87891 Personal history of nicotine dependence: Secondary | ICD-10-CM | POA: Diagnosis not present

## 2018-08-06 NOTE — ED Triage Notes (Addendum)
Patient to ER for c/o headache since falling one week ago. Patient states he tripped and fell. States headache is not severe, but has been present since falling. Patient denies any blurred vision, weakness, difficulty with speech. Denies any other symptoms. Patient ambulatory to triage without difficulty.  Denies being on anticoagulants.

## 2018-08-06 NOTE — ED Notes (Signed)
Patient to Room 2, [facility] RN aware of room placement.

## 2018-08-06 NOTE — ED Notes (Signed)
First Nurse Note: Spoke with Dr. Jimmye Norman regarding patient hx of HA X 1 week.  V.O. for head CT received.

## 2018-08-06 NOTE — ED Provider Notes (Signed)
[facility] Emergency Department Provider Note       Time seen: ----------------------------------------- 10:15 AM on 08/06/2018 -----------------------------------------   I have reviewed the triage vital signs and the nursing notes.  HISTORY   Chief Complaint Headache    HPI Mark ALBAN is a 68 y.o. male with a history of asthma, lung cancer, dyspnea, GERD, hypertension, neuromuscular disorder who presents to the ED for persistent mild headache.  Patient complains of a headache intermittently since falling a week ago.  He began to be concerned that there may be something more seriously wrong.  States the headache is frontal, he did have a laceration to his right temporal scalp at home which has resolved.  He denies any other neurologic symptoms or other complaints.  Past Medical History:  Diagnosis Date  . Asthma   . Cancer ([place])    lung  . Cataract    left cataract surgery  . Dyspnea   . [name]   . GERD (gastroesophageal reflux disease)   . Hypertension   . Neuromuscular disorder ([place])    pt has knees bilateral with tendon issues that cause pain    Patient Active Problem List   Diagnosis Date Noted  . Hypokalemia 01/07/2018  . Goals of care, counseling/discussion 12/13/2017  . Primary lung adenocarcinoma, right ([place]) 12/13/2017  . Chest pain with high risk for cardiac etiology 12/16/2014  . Abnormal nuclear stress test 12/16/2014    Past Surgical History:  Procedure Laterality Date  . CARDIAC CATHETERIZATION N/A 12/16/2014   Procedure: Left Heart Cath;  Surgeon: [name], MD;  Location: [place] [facility];  Service: Cardiovascular;  Laterality: N/A;  . CYST REMOVAL TRUNK     chest and back over time and it was removed  . ENDOBRONCHIAL ULTRASOUND N/A 12/09/2017   Procedure: ENDOBRONCHIAL ULTRASOUND;  Surgeon: [name], MD;  Location: [name];  Service: Pulmonary;  Laterality: N/A;  . left cataract  surgery    . PORTA CATH INSERTION N/A 12/23/2017   Procedure: PORTA CATH INSERTION;  Surgeon: [name], MD;  Location: [place] [facility];  Service: Cardiovascular;  Laterality: N/A;    Allergies Penicillins  Social History Social History   Tobacco Use  . Smoking status: Former Smoker    Packs/day: 1.50    Years: 35.00    Pack years: 52.50    Types: Cigarettes    Last attempt to quit: 06/22/2015    Years since quitting: 3.1  . Smokeless tobacco: Never Used  Substance Use Topics  . Alcohol use: Yes    Comment: a fifth in a week  . Drug use: No   Review of Systems Constitutional: Negative for fever. Cardiovascular: Negative for chest pain. Respiratory: Negative for shortness of breath. Gastrointestinal: Negative for abdominal pain, vomiting and diarrhea. Musculoskeletal: Negative for back pain. Skin: Negative for rash. Neurological: Positive for headache  All systems negative/normal/unremarkable except as stated in the HPI  ____________________________________________   PHYSICAL EXAM:  VITAL SIGNS: ED Triage Vitals  Enc Vitals Group     BP 08/06/18 0654 (!) 148/82     Pulse Rate 08/06/18 0654 72     Resp 08/06/18 0654 20     Temp 08/06/18 0654 97.7 F (36.5 C)     Temp Source 08/06/18 0654 Oral     SpO2 08/06/18 0654 93 %     Weight 08/06/18 0655 212 lb (96.2 kg)     Height 08/06/18 0655 6' (1.829 m)  Head Circumference --      Peak Flow --      Pain Score 08/06/18 0655 3     Pain Loc --      Pain Edu? --      Excl. in [place]? --    Constitutional: Alert and oriented. Well appearing and in no distress. Eyes: Conjunctivae are normal. Normal extraocular movements. ENT      Head: Normocephalic and atraumatic.      Nose: No congestion/rhinnorhea.      Mouth/Throat: Mucous membranes are moist.      Neck: No stridor. Cardiovascular: Normal rate, regular rhythm. No murmurs, rubs, or gallops. Respiratory: Normal respiratory effort without tachypnea nor  retractions. Breath sounds are clear and equal bilaterally. No wheezes/rales/rhonchi. Musculoskeletal: Nontender with normal range of motion in extremities. No lower extremity tenderness nor edema. Neurologic:  Normal speech and language. No gross focal neurologic deficits are appreciated.  Strength, sensation, cranial nerves appear to be normal Skin:  Skin is warm, dry and intact. No rash noted. Psychiatric: Mood and affect are normal. Speech and behavior are normal.  ____________________________________________  ED COURSE:  As part of my medical decision making, I reviewed the following data within the [place] History obtained from family if available, nursing notes, old chart and ekg, as well as notes from prior ED visits. Patient presented for fall with persistent headache, we will assess with imaging as indicated at this time.   Procedures ____________________________________________   RADIOLOGY  CT head IMPRESSION: Brain parenchyma appears unremarkable.  No mass or hemorrhage.  There is mild arterial vascular calcification. There is mucosal thickening in several ethmoid air cells. There is probable cerumen in each external auditory canal. ____________________________________________   DIFFERENTIAL DIAGNOSIS   Concussion, subdural, tension headache, migraine, sinus headache  FINAL ASSESSMENT AND PLAN  Head injury, headache   Plan: The patient had presented for headache with recent head injury. Patient's labs yesterday were unremarkable.  CT head was reassuring.  He is cleared for outpatient follow-up.   Laurence Aly, MD    Note: This note was generated in part or whole with voice recognition software. Voice recognition is usually quite accurate but there are transcription errors that can and very often do occur. I apologize for any typographical errors that were not detected and corrected.     Earleen Newport, MD 08/06/18 1018

## 2018-08-06 NOTE — ED Notes (Addendum)
First Nurse Note: Patient up to BR, alert and oriented.  Patient made aware of order for Head CT.  CT here to get patient.

## 2018-08-06 NOTE — ED Notes (Signed)
No new complaints verbalized.  Reassured patient about wait.  No new complaints verbalized.

## 2018-08-19 ENCOUNTER — Inpatient Hospital Stay: Payer: Medicare Other

## 2018-08-19 ENCOUNTER — Other Ambulatory Visit: Payer: Self-pay

## 2018-08-19 ENCOUNTER — Inpatient Hospital Stay (HOSPITAL_BASED_OUTPATIENT_CLINIC_OR_DEPARTMENT_OTHER): Payer: Medicare Other | Admitting: Oncology

## 2018-08-19 ENCOUNTER — Encounter: Payer: Self-pay | Admitting: Oncology

## 2018-08-19 VITALS — BP 142/79 | HR 79 | Temp 96.1°F | Resp 18 | Wt 214.9 lb

## 2018-08-19 DIAGNOSIS — Z87891 Personal history of nicotine dependence: Secondary | ICD-10-CM | POA: Diagnosis not present

## 2018-08-19 DIAGNOSIS — C3491 Malignant neoplasm of unspecified part of right bronchus or lung: Secondary | ICD-10-CM | POA: Diagnosis not present

## 2018-08-19 DIAGNOSIS — Z5112 Encounter for antineoplastic immunotherapy: Secondary | ICD-10-CM | POA: Diagnosis not present

## 2018-08-19 DIAGNOSIS — E876 Hypokalemia: Secondary | ICD-10-CM | POA: Diagnosis not present

## 2018-08-19 LAB — CBC WITH DIFFERENTIAL/PLATELET
Abs Immature Granulocytes: 0.02 10*3/uL (ref 0.00–0.07)
Basophils Absolute: 0 10*3/uL (ref 0.0–0.1)
Basophils Relative: 1 %
Eosinophils Absolute: 0.1 10*3/uL (ref 0.0–0.5)
Eosinophils Relative: 1 %
HCT: 40.7 % (ref 39.0–52.0)
Hemoglobin: 13.9 g/dL (ref 13.0–17.0)
Immature Granulocytes: 0 %
Lymphocytes Relative: 11 %
Lymphs Abs: 0.7 10*3/uL (ref 0.7–4.0)
MCH: 29 pg (ref 26.0–34.0)
MCHC: 34.2 g/dL (ref 30.0–36.0)
MCV: 84.8 fL (ref 80.0–100.0)
Monocytes Absolute: 0.9 10*3/uL (ref 0.1–1.0)
Monocytes Relative: 14 %
Neutro Abs: 4.6 10*3/uL (ref 1.7–7.7)
Neutrophils Relative %: 73 %
Platelets: 265 10*3/uL (ref 150–400)
RBC: 4.8 MIL/uL (ref 4.22–5.81)
RDW: 15.2 % (ref 11.5–15.5)
WBC: 6.3 10*3/uL (ref 4.0–10.5)
nRBC: 0 % (ref 0.0–0.2)

## 2018-08-19 LAB — COMPREHENSIVE METABOLIC PANEL
ALT: 19 U/L (ref 0–44)
AST: 20 U/L (ref 15–41)
Albumin: 4 g/dL (ref 3.5–5.0)
Alkaline Phosphatase: 59 U/L (ref 38–126)
Anion gap: 8 (ref 5–15)
BUN: 12 mg/dL (ref 8–23)
CO2: 26 mmol/L (ref 22–32)
Calcium: 9.2 mg/dL (ref 8.9–10.3)
Chloride: 104 mmol/L (ref 98–111)
Creatinine, Ser: 0.84 mg/dL (ref 0.61–1.24)
GFR calc Af Amer: >60 mL/min (ref >60)
GFR calc non Af Amer: >60 mL/min (ref >60)
Glucose, Bld: 110 mg/dL — ABNORMAL HIGH (ref 70–99)
Potassium: 3.7 mmol/L (ref 3.5–5.1)
Sodium: 138 mmol/L (ref 135–145)
Total Bilirubin: 0.6 mg/dL (ref 0.3–1.2)
Total Protein: 7.7 g/dL (ref 6.5–8.1)

## 2018-08-19 LAB — TSH: TSH: 1.448 u[IU]/mL (ref 0.350–4.500)

## 2018-08-19 MED ORDER — LIDOCAINE-PRILOCAINE 2.5-2.5 % EX CREA: TOPICAL_CREAM | CUTANEOUS | 3 refills | Status: DC

## 2018-08-19 MED ORDER — SODIUM CHLORIDE 0.9% FLUSH
10.0000 mL | INTRAVENOUS | Status: DC | PRN
  Administered 2018-08-19: 10 mL via INTRAVENOUS
  Filled 2018-08-19: qty 10

## 2018-08-19 MED ORDER — SODIUM CHLORIDE 0.9 % IV SOLN
Freq: Once | INTRAVENOUS | Status: AC
  Administered 2018-08-19: 10:00:00 via INTRAVENOUS
  Filled 2018-08-19: qty 250

## 2018-08-19 MED ORDER — SODIUM CHLORIDE 0.9 % IV SOLN
10.0000 mg/kg | Freq: Once | INTRAVENOUS | Status: AC
  Administered 2018-08-19: 960 mg via INTRAVENOUS
  Filled 2018-08-19: qty 10

## 2018-08-19 MED ORDER — HEPARIN SOD (PORK) LOCK FLUSH 100 UNIT/ML IV SOLN
500.0000 [IU] | Freq: Once | INTRAVENOUS | Status: AC
  Administered 2018-08-19: 500 [IU] via INTRAVENOUS
  Filled 2018-08-19: qty 5

## 2018-08-19 NOTE — Progress Notes (Signed)
Patient here for follow up. He completed antibiotics this morning for sinus infection. Pt states he had a fall 3 weeks ago and hit his head, He ended up going to ER a few days due to headache but CT scan did not show any serious injury.

## 2018-08-19 NOTE — Progress Notes (Signed)
Hematology/Oncology Follow up note [facility] [phone] [phone]   Patient Care Team: Mark Kayser, MD as PCP - General (Internal Medicine) Mark Nab, RN as Registered Nurse   REASON FOR VISIT Follow up for treatment of Stage III lung adenocarcinoma  Oncology History:  Mark Mccarty is a  68 y.o.  male with  adenocarcinoma of lung, diagnosed via biopsy of right hilar lymph nodes.  Former 43 pack year smoking history.  Presents for assessment prior to concurrent chemo T3 N1 M0  INTERVAL HISTORY Mark Mccarty is a 68 y.o. male who has above history reviewed by me presents for assessment prior to immunotherapy with durvalumab for treatment of stage III non-small cell lung cancer. During the interval, patient had a fall and presented to emergency room for evaluation of headache.  CT head unremarkable. Today he feels doing well at baseline. Tolerating immunotherapy well. Also reports that he had a sinus infection and has just completed a course of antibiotics. Still have mild nasal congestion.  Otherwise doing well. He takes potassium chloride 40 mEq daily. . Review of Systems  Constitutional: Negative for appetite change, chills, fatigue, fever and unexpected weight change.  [name]:   Negative for hearing loss and voice change.   Eyes: Negative for eye problems and icterus.  Respiratory: Positive for cough. Negative for chest tightness and shortness of breath.   Cardiovascular: Negative for chest pain and leg swelling.  Gastrointestinal: Negative for abdominal distention and abdominal pain.  Endocrine: Negative for hot flashes.  Genitourinary: Negative for difficulty urinating, dysuria and frequency.   Musculoskeletal: Negative for arthralgias.  Skin: Negative for itching and rash.  Neurological: Negative for light-headedness and numbness.  [name]: Negative for adenopathy. Does not bruise/bleed easily.    Psychiatric/Behavioral: Negative for confusion.     MEDICAL HISTORY:  Past Medical History:  Diagnosis Date  . Asthma   . Cancer ([place])    lung  . Cataract    left cataract surgery  . Dyspnea   . [name]   . GERD (gastroesophageal reflux disease)   . Hypertension   . Neuromuscular disorder ([place])    pt has knees bilateral with tendon issues that cause pain    SURGICAL HISTORY: Past Surgical History:  Procedure Laterality Date  . CARDIAC CATHETERIZATION N/A 12/16/2014   Procedure: Left Heart Cath;  Surgeon: [name], MD;  Location: [place] [facility];  Service: Cardiovascular;  Laterality: N/A;  . CYST REMOVAL TRUNK     chest and back over time and it was removed  . ENDOBRONCHIAL ULTRASOUND N/A 12/09/2017   Procedure: ENDOBRONCHIAL ULTRASOUND;  Surgeon: [name], MD;  Location: [name];  Service: Pulmonary;  Laterality: N/A;  . left cataract surgery    . PORTA CATH INSERTION N/A 12/23/2017   Procedure: PORTA CATH INSERTION;  Surgeon: [name], MD;  Location: [place] [facility];  Service: Cardiovascular;  Laterality: N/A;    SOCIAL HISTORY: Social History   Socioeconomic History  . Marital status: Divorced    Spouse name: Not on file  . Number of children: Not on file  . Years of education: Not on file  . Highest education level: Not on file  Occupational History  . Not on file  Social Needs  . Financial resource strain: Not on file  . Food insecurity:    Worry: Not on file    Inability: Not on file  . Transportation needs:    Medical: Not on file  Non-medical: Not on file  Tobacco Use  . Smoking status: Former Smoker    Packs/day: 1.50    Years: 35.00    Pack years: 52.50    Types: Cigarettes    Last attempt to quit: 06/22/2015    Years since quitting: 3.1  . Smokeless tobacco: Never Used  Substance and Sexual Activity  . Alcohol use: Yes    Comment: a fifth in a week  . Drug use: No  . Sexual activity: Not Currently   Lifestyle  . Physical activity:    Days per week: Not on file    Minutes per session: Not on file  . Stress: Not on file  Relationships  . Social connections:    Talks on phone: Not on file    Gets together: Not on file    Attends religious service: Not on file    Active member of club or organization: Not on file    Attends meetings of clubs or organizations: Not on file    Relationship status: Not on file  . Intimate partner violence:    Fear of current or ex partner: Not on file    Emotionally abused: Not on file    Physically abused: Not on file    Forced sexual activity: Not on file  Other Topics Concern  . Not on file  Social History Narrative  . Not on file    FAMILY HISTORY: Family History  Problem Relation Age of Onset  . Hypertension Mother   . Breast cancer Mother   . Heart attack Mother   . Lung cancer Father   . Heart disease Sister   . Diabetes Sister   . Colon cancer Sister     ALLERGIES:  is allergic to penicillins.  MEDICATIONS:  Current Outpatient Medications  Medication Sig Dispense Refill  . acetaminophen (TYLENOL) 500 MG tablet Take 500 mg by mouth as needed.    [name] albuterol (PROVENTIL HFA;VENTOLIN HFA) 108 (90 Base) MCG/ACT inhaler Inhale 2 puffs into the lungs every 6 (six) hours as needed.  12  . amLODipine (NORVASC) 10 MG tablet Take 10 mg by mouth daily.    Jearl Klinefelter ELLIPTA 62.5-25 MCG/INH AEPB TAKE 1 PUFF BY MOUTH EVERY DAY 180 each 3  . aspirin 81 MG tablet Take 81 mg by mouth daily.    . benzonatate (TESSALON) 200 MG capsule TAKE 1 CAPSULE BY MOUTH EVERY DAY 3 TIMES A DAY AS NEEDED FOR COUGH    . carvedilol (COREG) 25 MG tablet Take 25 mg by mouth 2 (two) times daily with a meal.    . fluticasone (FLONASE) 50 MCG/ACT nasal spray PLACE 1 SPRAY INTO BOTH NOSTRILS 2 (TWO) TIMES DAILY  0  . gabapentin (NEURONTIN) 300 MG capsule Take 300 mg by mouth at bedtime as needed.     [name] guaiFENesin-codeine 100-10 MG/5ML syrup TAKE 5MLS BY MOUTH EVERY 4  HOURS AS NEEDED    . ibuprofen (ADVIL,MOTRIN) 600 MG tablet Take 600 mg by mouth every 6 (six) hours as needed.    . lidocaine-prilocaine (EMLA) cream Apply to port area once 30 g 3  . lisinopril (PRINIVIL,ZESTRIL) 40 MG tablet Take 40 mg by mouth daily.    . mupirocin ointment (BACTROBAN) 2 % Place 1 application into the nose 3 (three) times daily. 15 g 0  . potassium chloride SA (KLOR-CON M20) 20 MEQ tablet Take 2 tablets (40 mEq total) by mouth daily. 60 tablet 2  . prochlorperazine (COMPAZINE) 10 MG tablet Take  1 tablet (10 mg total) by mouth every 6 (six) hours as needed (Nausea or vomiting). 30 tablet 1  . senna (SENOKOT) 8.6 MG TABS tablet Take 2 tablets (17.2 mg total) by mouth daily. 120 each 0  . tamsulosin (FLOMAX) 0.4 MG CAPS capsule Take 0.4 mg by mouth daily.     No current facility-administered medications for this visit.    Facility-Administered Medications Ordered in Other Visits  Medication Dose Route Frequency Provider Last Rate Last Dose  . sodium chloride flush (NS) 0.9 % injection 10 mL  10 mL Intravenous PRN [name], MD   10 mL at 03/18/18 0858     PHYSICAL EXAMINATION: ECOG PERFORMANCE STATUS: 0 - Asymptomatic Vitals:   08/19/18 0919  BP: (!) 142/79  Pulse: 79  Resp: 18  Temp: (!) 96.1 F (35.6 C)  SpO2: 95%   Filed Weights   08/19/18 0919  Weight: 214 lb 14.4 oz (97.5 kg)    Physical Exam Constitutional:      General: He is not in acute distress. [name]:     Head: Normocephalic and atraumatic.  Eyes:     General: No scleral icterus.    Conjunctiva/sclera: Conjunctivae normal.     Pupils: Pupils are equal, round, and reactive to light.  Neck:     Musculoskeletal: Normal range of motion and neck supple.  Cardiovascular:     Rate and Rhythm: Normal rate and regular rhythm.     Heart sounds: Normal heart sounds.  Pulmonary:     Effort: Pulmonary effort is normal. No respiratory distress.     Breath sounds: No wheezing or rales.     Comments:  Decreased breath sound bilaterally Chest:     Chest wall: No tenderness.  Abdominal:     General: Bowel sounds are normal. There is no distension.     Palpations: Abdomen is soft. There is no mass.     Tenderness: There is no abdominal tenderness.  Musculoskeletal: Normal range of motion.        General: No deformity.  Lymphadenopathy:     Cervical: No cervical adenopathy.  Skin:    General: Skin is warm and dry.     Findings: No erythema or rash.  Neurological:     Mental Status: He is alert and oriented to person, place, and time.     Cranial Nerves: No cranial nerve deficit.     Coordination: Coordination normal.  Psychiatric:        Behavior: Behavior normal.        Thought Content: Thought content normal.      LABORATORY DATA:  I have reviewed the data as listed Lab Results  Component Value Date   WBC 6.3 08/19/2018   HGB 13.9 08/19/2018   HCT 40.7 08/19/2018   MCV 84.8 08/19/2018   PLT 265 08/19/2018   Recent Labs    07/22/18 0827 08/05/18 0841 08/19/18 0901  NA 138 140 138  K 3.0* 3.2* 3.7  CL 102 105 104  CO2 27 28 26   GLUCOSE 172* 153* 110*  BUN 11 12 12   CREATININE 0.90 0.93 0.84  CALCIUM 9.0 8.9 9.2  GFRNONAA >60 >60 >60  GFRAA >60 >60 >60  PROT 7.4 7.4 7.7  ALBUMIN 4.0 3.8 4.0  AST 22 21 20   ALT 15 15 19   ALKPHOS 63 66 59  BILITOT 0.4 0.7 0.6   RADIOGRAPHIC STUDIES: I have personally reviewed the radiological images as listed and agreed with the findings in the report.  03/21/2018 CT chest showed mild decreasing size of the right upper lobe pulmonary nodule.  Interval decrease in size of right paratracheal and right hilar adenopathy.  06/30/2018 CT chest showed interval development of extensive changes secondary to external beam radiation within the posterior right upper lobe and superior segment of right lower lobe.  The enlarged right paratracheal lymph node is decreased in size in the interval.  Right upper lobe lung lesion is difficult to  separate from surrounding changes due to radiation but is favored to have decrease in size in the interval.  Aortic atherosclerosis and emphysema.  LAD coronary artery atherosclerotic calcification.  08/06/2018 CT head without contrast Brain parenchymal appears unremarkable there is no mass or hemorrhage.   ASSESSMENT & PLAN:  1. Encounter for antineoplastic immunotherapy   2. Primary lung adenocarcinoma, right ([place])   3. Hypokalemia   T3 N1 M0 lung adenocarcinoma, s/p concurrent chemoradiation.   #Stage III lung adenocarcinoma, Patient tolerated immunotherapy quite well with immunotherapy related toxicities Labs are reviewed and discussed with patient.  Counts today's maintenance durvalumab.  Total of 26 cycles.  TSH has been monitored and has been stable  #Hypokalemia due to electric wasting..  Potassium is 3.7 today.  Continue take potassium chloride 40 mEq daily.  Return of visit: 2 weeks for evaluation prior to next durvalumab treatment.  [name], MD, PhD Hematology Oncology [facility] Pager- [phone] 08/19/2018

## 2018-09-02 ENCOUNTER — Inpatient Hospital Stay (HOSPITAL_BASED_OUTPATIENT_CLINIC_OR_DEPARTMENT_OTHER): Payer: Medicare Other | Admitting: Oncology

## 2018-09-02 ENCOUNTER — Encounter: Payer: Self-pay | Admitting: Oncology

## 2018-09-02 ENCOUNTER — Inpatient Hospital Stay: Payer: Medicare Other

## 2018-09-02 ENCOUNTER — Inpatient Hospital Stay: Payer: Medicare Other | Attending: Oncology

## 2018-09-02 ENCOUNTER — Other Ambulatory Visit: Payer: Self-pay

## 2018-09-02 VITALS — BP 128/71 | HR 72 | Temp 96.5°F | Wt 217.1 lb

## 2018-09-02 DIAGNOSIS — Z79899 Other long term (current) drug therapy: Secondary | ICD-10-CM | POA: Insufficient documentation

## 2018-09-02 DIAGNOSIS — C3491 Malignant neoplasm of unspecified part of right bronchus or lung: Secondary | ICD-10-CM

## 2018-09-02 DIAGNOSIS — E876 Hypokalemia: Secondary | ICD-10-CM

## 2018-09-02 DIAGNOSIS — Z5112 Encounter for antineoplastic immunotherapy: Secondary | ICD-10-CM

## 2018-09-02 DIAGNOSIS — Z87891 Personal history of nicotine dependence: Secondary | ICD-10-CM | POA: Diagnosis not present

## 2018-09-02 LAB — CBC WITH DIFFERENTIAL/PLATELET
Abs Immature Granulocytes: 0.02 10*3/uL (ref 0.00–0.07)
Basophils Absolute: 0 10*3/uL (ref 0.0–0.1)
Basophils Relative: 1 %
Eosinophils Absolute: 0.1 10*3/uL (ref 0.0–0.5)
Eosinophils Relative: 2 %
HCT: 40.8 % (ref 39.0–52.0)
Hemoglobin: 13.9 g/dL (ref 13.0–17.0)
Immature Granulocytes: 0 %
Lymphocytes Relative: 12 %
Lymphs Abs: 0.7 10*3/uL (ref 0.7–4.0)
MCH: 29 pg (ref 26.0–34.0)
MCHC: 34.1 g/dL (ref 30.0–36.0)
MCV: 85.2 fL (ref 80.0–100.0)
MONOS PCT: 13 %
Monocytes Absolute: 0.7 10*3/uL (ref 0.1–1.0)
Neutro Abs: 4.2 10*3/uL (ref 1.7–7.7)
Neutrophils Relative %: 72 %
Platelets: 261 10*3/uL (ref 150–400)
RBC: 4.79 MIL/uL (ref 4.22–5.81)
RDW: 15 % (ref 11.5–15.5)
WBC: 5.8 10*3/uL (ref 4.0–10.5)
nRBC: 0 % (ref 0.0–0.2)

## 2018-09-02 LAB — COMPREHENSIVE METABOLIC PANEL
ALK PHOS: 59 U/L (ref 38–126)
ALT: 14 U/L (ref 0–44)
AST: 23 U/L (ref 15–41)
Albumin: 3.8 g/dL (ref 3.5–5.0)
Anion gap: 10 (ref 5–15)
BUN: 12 mg/dL (ref 8–23)
CO2: 25 mmol/L (ref 22–32)
Calcium: 9.2 mg/dL (ref 8.9–10.3)
Chloride: 104 mmol/L (ref 98–111)
Creatinine, Ser: 0.79 mg/dL (ref 0.61–1.24)
GFR calc Af Amer: >60 mL/min (ref >60)
GFR calc non Af Amer: >60 mL/min (ref >60)
Glucose, Bld: 123 mg/dL — ABNORMAL HIGH (ref 70–99)
Potassium: 3.6 mmol/L (ref 3.5–5.1)
Sodium: 139 mmol/L (ref 135–145)
TOTAL PROTEIN: 7.5 g/dL (ref 6.5–8.1)
Total Bilirubin: 0.8 mg/dL (ref 0.3–1.2)

## 2018-09-02 MED ORDER — SODIUM CHLORIDE 0.9 % IV SOLN
10.0000 mg/kg | Freq: Once | INTRAVENOUS | Status: AC
  Administered 2018-09-02: 960 mg via INTRAVENOUS
  Filled 2018-09-02: qty 19.2

## 2018-09-02 MED ORDER — SODIUM CHLORIDE 0.9% FLUSH
10.0000 mL | Freq: Once | INTRAVENOUS | Status: AC
  Administered 2018-09-02: 10 mL via INTRAVENOUS
  Filled 2018-09-02: qty 10

## 2018-09-02 MED ORDER — HEPARIN SOD (PORK) LOCK FLUSH 100 UNIT/ML IV SOLN
500.0000 [IU] | Freq: Once | INTRAVENOUS | Status: AC
  Administered 2018-09-02: 500 [IU] via INTRAVENOUS
  Filled 2018-09-02: qty 5

## 2018-09-02 MED ORDER — SODIUM CHLORIDE 0.9 % IV SOLN
Freq: Once | INTRAVENOUS | Status: AC
  Administered 2018-09-02: 10:00:00 via INTRAVENOUS
  Filled 2018-09-02: qty 250

## 2018-09-02 NOTE — Progress Notes (Signed)
Hematology/Oncology Follow up note [name] [phone] [phone]) [phone]   Patient Care Team: [name], MD as PCP - General (Internal Medicine) [name], RN as Registered Nurse   REASON FOR VISIT Follow up for treatment of Stage III lung adenocarcinoma  Oncology History:  Mark Mccarty is a  68 y.o.  male with  adenocarcinoma of lung, diagnosed via biopsy of right hilar lymph nodes.  Former 43 pack year smoking history.  Presents for assessment prior to concurrent chemo T3 N1 M0  INTERVAL HISTORY Mark Mccarty is a 68 y.o. male who has above history reviewed by me presents for assessment prior to immunotherapy with durvalumab for treatment of stage III non-small cell lung cancer. During the interval, patient reports doing well.  No new complaint today. Tolerating immunotherapy without any clinical signs of immunotherapy related toxicities.  Denies any skin rash, diarrhea. Nasal congestion has resolved. Breathing well.  Chronic shortness of breath with exertion and a cough at baseline.  He takes potassium chloride 40 mEq daily. . Review of Systems  Constitutional: Negative for appetite change, chills, fatigue, fever and unexpected weight change.  [name]:   Negative for hearing loss and voice change.   Eyes: Negative for eye problems and icterus.  Respiratory: Positive for cough. Negative for chest tightness and shortness of breath.   Cardiovascular: Negative for chest pain and leg swelling.  Gastrointestinal: Negative for abdominal distention and abdominal pain.  Endocrine: Negative for hot flashes.  Genitourinary: Negative for difficulty urinating, dysuria and frequency.   Musculoskeletal: Negative for arthralgias.  Skin: Negative for itching and rash.  Neurological: Negative for light-headedness and numbness.  [name]: Negative for adenopathy. Does not bruise/bleed easily.  Psychiatric/Behavioral: Negative for  confusion.     MEDICAL HISTORY:  Past Medical History:  Diagnosis Date  . Asthma   . Cancer ([place])    lung  . Cataract    left cataract surgery  . Dyspnea   . [name]   . GERD (gastroesophageal reflux disease)   . Hypertension   . Neuromuscular disorder ([place])    pt has knees bilateral with tendon issues that cause pain    SURGICAL HISTORY: Past Surgical History:  Procedure Laterality Date  . CARDIAC CATHETERIZATION N/A 12/16/2014   Procedure: Left Heart Cath;  Surgeon: [name], MD;  Location: [place] [facility];  Service: Cardiovascular;  Laterality: N/A;  . CYST REMOVAL TRUNK     chest and back over time and it was removed  . ENDOBRONCHIAL ULTRASOUND N/A 12/09/2017   Procedure: ENDOBRONCHIAL ULTRASOUND;  Surgeon: [name], MD;  Location: [name];  Service: Pulmonary;  Laterality: N/A;  . left cataract surgery    . PORTA CATH INSERTION N/A 12/23/2017   Procedure: PORTA CATH INSERTION;  Surgeon: [name], MD;  Location: [place] [facility];  Service: Cardiovascular;  Laterality: N/A;    SOCIAL HISTORY: Social History   Socioeconomic History  . Marital status: Divorced    Spouse name: Not on file  . Number of children: Not on file  . Years of education: Not on file  . Highest education level: Not on file  Occupational History  . Not on file  Social Needs  . Financial resource strain: Not on file  . Food insecurity:    Worry: Not on file    Inability: Not on file  . Transportation needs:    Medical: Not on file    Non-medical: Not on file  Tobacco Use  .  Smoking status: Former Smoker    Packs/day: 1.50    Years: 35.00    Pack years: 52.50    Types: Cigarettes    Last attempt to quit: 06/22/2015    Years since quitting: 3.2  . Smokeless tobacco: Never Used  Substance and Sexual Activity  . Alcohol use: Yes    Comment: a fifth in a week  . Drug use: No  . Sexual activity: Not Currently  Lifestyle  . Physical activity:     Days per week: Not on file    Minutes per session: Not on file  . Stress: Not on file  Relationships  . Social connections:    Talks on phone: Not on file    Gets together: Not on file    Attends religious service: Not on file    Active member of club or organization: Not on file    Attends meetings of clubs or organizations: Not on file    Relationship status: Not on file  . Intimate partner violence:    Fear of current or ex partner: Not on file    Emotionally abused: Not on file    Physically abused: Not on file    Forced sexual activity: Not on file  Other Topics Concern  . Not on file  Social History Narrative  . Not on file    FAMILY HISTORY: Family History  Problem Relation Age of Onset  . Hypertension Mother   . Breast cancer Mother   . Heart attack Mother   . Lung cancer Father   . Heart disease Sister   . Diabetes Sister   . Colon cancer Sister     ALLERGIES:  is allergic to penicillins.  MEDICATIONS:  Current Outpatient Medications  Medication Sig Dispense Refill  . acetaminophen (TYLENOL) 500 MG tablet Take 500 mg by mouth as needed.    [name] albuterol (PROVENTIL HFA;VENTOLIN HFA) 108 (90 Base) MCG/ACT inhaler Inhale 2 puffs into the lungs every 6 (six) hours as needed.  12  . amLODipine (NORVASC) 10 MG tablet Take 10 mg by mouth daily.    Jearl Klinefelter ELLIPTA 62.5-25 MCG/INH AEPB TAKE 1 PUFF BY MOUTH EVERY DAY 180 each 3  . aspirin 81 MG tablet Take 81 mg by mouth daily.    . benzonatate (TESSALON) 200 MG capsule TAKE 1 CAPSULE BY MOUTH EVERY DAY 3 TIMES A DAY AS NEEDED FOR COUGH    . carvedilol (COREG) 25 MG tablet Take 25 mg by mouth 2 (two) times daily with a meal.    . fluticasone (FLONASE) 50 MCG/ACT nasal spray PLACE 1 SPRAY INTO BOTH NOSTRILS 2 (TWO) TIMES DAILY  0  . gabapentin (NEURONTIN) 300 MG capsule Take 300 mg by mouth at bedtime as needed.     [name] guaiFENesin-codeine 100-10 MG/5ML syrup TAKE 5MLS BY MOUTH EVERY 4 HOURS AS NEEDED    . ibuprofen  (ADVIL,MOTRIN) 600 MG tablet Take 600 mg by mouth every 6 (six) hours as needed.    . lidocaine-prilocaine (EMLA) cream Apply to port area once 30 g 3  . lisinopril (PRINIVIL,ZESTRIL) 40 MG tablet Take 40 mg by mouth daily.    . mupirocin ointment (BACTROBAN) 2 % Place 1 application into the nose 3 (three) times daily. 15 g 0  . potassium chloride SA (KLOR-CON M20) 20 MEQ tablet Take 2 tablets (40 mEq total) by mouth daily. 60 tablet 2  . prochlorperazine (COMPAZINE) 10 MG tablet Take 1 tablet (10 mg total) by mouth every 6 (  six) hours as needed (Nausea or vomiting). 30 tablet 1  . senna (SENOKOT) 8.6 MG TABS tablet Take 2 tablets (17.2 mg total) by mouth daily. 120 each 0  . tamsulosin (FLOMAX) 0.4 MG CAPS capsule Take 0.4 mg by mouth daily.     No current facility-administered medications for this visit.    Facility-Administered Medications Ordered in Other Visits  Medication Dose Route Frequency Provider Last Rate Last Dose  . sodium chloride flush (NS) 0.9 % injection 10 mL  10 mL Intravenous PRN [name], MD   10 mL at 03/18/18 0858     PHYSICAL EXAMINATION: ECOG PERFORMANCE STATUS: 0 - Asymptomatic Vitals:   09/02/18 0858  BP: 128/71  Pulse: 72  Temp: (!) 96.5 F (35.8 C)  SpO2: 94%   Filed Weights   09/02/18 0858  Weight: 217 lb 1.6 oz (98.5 kg)    Physical Exam Constitutional:      General: He is not in acute distress. [name]:     Head: Normocephalic and atraumatic.  Eyes:     General: No scleral icterus.    Conjunctiva/sclera: Conjunctivae normal.     Pupils: Pupils are equal, round, and reactive to light.  Neck:     Musculoskeletal: Normal range of motion and neck supple.  Cardiovascular:     Rate and Rhythm: Normal rate and regular rhythm.     Heart sounds: Normal heart sounds.  Pulmonary:     Effort: Pulmonary effort is normal. No respiratory distress.     Breath sounds: No wheezing or rales.     Comments: Decreased breath sound bilaterally Chest:      Chest wall: No tenderness.  Abdominal:     General: Bowel sounds are normal. There is no distension.     Palpations: Abdomen is soft. There is no mass.     Tenderness: There is no abdominal tenderness.  Musculoskeletal: Normal range of motion.        General: No deformity.  Lymphadenopathy:     Cervical: No cervical adenopathy.  Skin:    General: Skin is warm and dry.     Findings: No erythema or rash.  Neurological:     Mental Status: He is alert and oriented to person, place, and time.     Cranial Nerves: No cranial nerve deficit.     Coordination: Coordination normal.  Psychiatric:        Behavior: Behavior normal.        Thought Content: Thought content normal.      LABORATORY DATA:  I have reviewed the data as listed Lab Results  Component Value Date   WBC 5.8 09/02/2018   HGB 13.9 09/02/2018   HCT 40.8 09/02/2018   MCV 85.2 09/02/2018   PLT 261 09/02/2018   Recent Labs    08/05/18 0841 08/19/18 0901 09/02/18 0840  NA 140 138 139  K 3.2* 3.7 3.6  CL 105 104 104  CO2 28 26 25   GLUCOSE 153* 110* 123*  BUN 12 12 12   CREATININE 0.93 0.84 0.79  CALCIUM 8.9 9.2 9.2  GFRNONAA >60 >60 >60  GFRAA >60 >60 >60  PROT 7.4 7.7 7.5  ALBUMIN 3.8 4.0 3.8  AST 21 20 23   ALT 15 19 14   ALKPHOS 66 59 59  BILITOT 0.7 0.6 0.8   RADIOGRAPHIC STUDIES: I have personally reviewed the radiological images as listed and agreed with the findings in the report.  03/21/2018 CT chest showed mild decreasing size of the right upper lobe  pulmonary nodule.  Interval decrease in size of right paratracheal and right hilar adenopathy.  06/30/2018 CT chest showed interval development of extensive changes secondary to external beam radiation within the posterior right upper lobe and superior segment of right lower lobe.  The enlarged right paratracheal lymph node is decreased in size in the interval.  Right upper lobe lung lesion is difficult to separate from surrounding changes due to radiation  but is favored to have decrease in size in the interval.  Aortic atherosclerosis and emphysema.  LAD coronary artery atherosclerotic calcification.  08/06/2018 CT head without contrast Brain parenchymal appears unremarkable there is no mass or hemorrhage.   ASSESSMENT & PLAN:  1. Primary lung adenocarcinoma, right ([place])   2. Hypokalemia   3. Encounter for antineoplastic immunotherapy   T3 N1 M0 lung adenocarcinoma, s/p concurrent chemoradiation.   #Stage III lung adenocarcinoma, He tolerates immunotherapy quite well with no immunotherapy related toxicities. Labs reviewed and discussed with patient.  Counts today is acceptable for maintenance durvalumab. Plan total of 26 cycles. TSH has been monitored and has been stable Will repeat surveillance CT chest after next treatment.  #Hypokalemia due to electric wasting..  Potassium is 3.6 today.  Advised patient to continue take potassium chloride 40 mEq daily.  Return of visit: 2 weeks for evaluation prior to next durvalumab treatment. We spent sufficient time to discuss many aspect of care, questions were answered to patient's satisfaction. Total face to face encounter time for this patient visit was 25 min. >50% of the time was  spent in counseling and coordination of care.    [name], MD, PhD Hematology Oncology [facility] Pager- [phone] 09/02/2018

## 2018-09-16 ENCOUNTER — Inpatient Hospital Stay (HOSPITAL_BASED_OUTPATIENT_CLINIC_OR_DEPARTMENT_OTHER): Payer: Medicare Other | Admitting: Oncology

## 2018-09-16 ENCOUNTER — Encounter: Payer: Self-pay | Admitting: Oncology

## 2018-09-16 ENCOUNTER — Other Ambulatory Visit: Payer: Self-pay

## 2018-09-16 ENCOUNTER — Inpatient Hospital Stay: Payer: Medicare Other

## 2018-09-16 VITALS — BP 145/79 | HR 71 | Temp 96.1°F | Resp 18 | Wt 216.4 lb

## 2018-09-16 DIAGNOSIS — Z5112 Encounter for antineoplastic immunotherapy: Secondary | ICD-10-CM

## 2018-09-16 DIAGNOSIS — R0609 Other forms of dyspnea: Secondary | ICD-10-CM | POA: Diagnosis not present

## 2018-09-16 DIAGNOSIS — C3491 Malignant neoplasm of unspecified part of right bronchus or lung: Secondary | ICD-10-CM

## 2018-09-16 DIAGNOSIS — E876 Hypokalemia: Secondary | ICD-10-CM

## 2018-09-16 DIAGNOSIS — Z95828 Presence of other vascular implants and grafts: Secondary | ICD-10-CM

## 2018-09-16 DIAGNOSIS — Z87891 Personal history of nicotine dependence: Secondary | ICD-10-CM

## 2018-09-16 LAB — COMPREHENSIVE METABOLIC PANEL
ALT: 18 U/L (ref 0–44)
AST: 23 U/L (ref 15–41)
Albumin: 4 g/dL (ref 3.5–5.0)
Alkaline Phosphatase: 61 U/L (ref 38–126)
Anion gap: 7 (ref 5–15)
BUN: 12 mg/dL (ref 8–23)
CO2: 28 mmol/L (ref 22–32)
Calcium: 9 mg/dL (ref 8.9–10.3)
Chloride: 102 mmol/L (ref 98–111)
Creatinine, Ser: 0.97 mg/dL (ref 0.61–1.24)
GFR calc Af Amer: >60 mL/min (ref >60)
GFR calc non Af Amer: >60 mL/min (ref >60)
GLUCOSE: 126 mg/dL — AB (ref 70–99)
POTASSIUM: 3.4 mmol/L — AB (ref 3.5–5.1)
Sodium: 137 mmol/L (ref 135–145)
Total Bilirubin: 0.9 mg/dL (ref 0.3–1.2)
Total Protein: 7.6 g/dL (ref 6.5–8.1)

## 2018-09-16 LAB — CBC WITH DIFFERENTIAL/PLATELET
Abs Immature Granulocytes: 0.01 10*3/uL (ref 0.00–0.07)
BASOS ABS: 0 10*3/uL (ref 0.0–0.1)
Basophils Relative: 1 %
Eosinophils Absolute: 0.1 10*3/uL (ref 0.0–0.5)
Eosinophils Relative: 2 %
HCT: 40.8 % (ref 39.0–52.0)
Hemoglobin: 13.9 g/dL (ref 13.0–17.0)
Immature Granulocytes: 0 %
Lymphocytes Relative: 11 %
Lymphs Abs: 0.7 10*3/uL (ref 0.7–4.0)
MCH: 28.8 pg (ref 26.0–34.0)
MCHC: 34.1 g/dL (ref 30.0–36.0)
MCV: 84.5 fL (ref 80.0–100.0)
Monocytes Absolute: 0.7 10*3/uL (ref 0.1–1.0)
Monocytes Relative: 11 %
NRBC: 0 % (ref 0.0–0.2)
Neutro Abs: 4.9 10*3/uL (ref 1.7–7.7)
Neutrophils Relative %: 75 %
PLATELETS: 255 10*3/uL (ref 150–400)
RBC: 4.83 MIL/uL (ref 4.22–5.81)
RDW: 14.6 % (ref 11.5–15.5)
WBC: 6.5 10*3/uL (ref 4.0–10.5)

## 2018-09-16 LAB — TSH: TSH: 2.825 u[IU]/mL (ref 0.350–4.500)

## 2018-09-16 MED ORDER — HEPARIN SOD (PORK) LOCK FLUSH 100 UNIT/ML IV SOLN
500.0000 [IU] | Freq: Once | INTRAVENOUS | Status: AC | PRN
  Administered 2018-09-16: 500 [IU]
  Filled 2018-09-16: qty 5

## 2018-09-16 MED ORDER — SODIUM CHLORIDE 0.9% FLUSH
10.0000 mL | Freq: Once | INTRAVENOUS | Status: AC
  Administered 2018-09-16: 10 mL via INTRAVENOUS
  Filled 2018-09-16: qty 10

## 2018-09-16 MED ORDER — SODIUM CHLORIDE 0.9 % IV SOLN
Freq: Once | INTRAVENOUS | Status: AC
  Administered 2018-09-16: 10:00:00 via INTRAVENOUS
  Filled 2018-09-16: qty 250

## 2018-09-16 MED ORDER — SODIUM CHLORIDE 0.9 % IV SOLN
10.0000 mg/kg | Freq: Once | INTRAVENOUS | Status: AC
  Administered 2018-09-16: 960 mg via INTRAVENOUS
  Filled 2018-09-16: qty 9.6

## 2018-09-16 NOTE — Progress Notes (Signed)
Patient here for follow up. Pt states he is feeling "great." No new concerns.

## 2018-09-16 NOTE — Progress Notes (Signed)
Hematology/Oncology Follow up note [facility] [phone] Fax:([phone]   Patient Care Team: [name], MD as PCP - General (Internal Medicine) [name], RN as Registered Nurse   REASON FOR VISIT Follow up for treatment of Stage III lung adenocarcinoma  Oncology History:  Mark Mccarty is a  68 y.o.  male with  adenocarcinoma of lung, diagnosed via biopsy of right hilar lymph nodes.  Former 43 pack year smoking history.  Presents for assessment prior to concurrent chemo T3 N1 M0  INTERVAL HISTORY Mark Mccarty is a 68 y.o. male who has above history reviewed by me presents for assessment prior to immunotherapy with durvalumab for treatment of stage III non-small cell lung cancer. Patient continues to do well.  No complaints today. He tolerates immunotherapy without significant clinical signs of immunotherapy related toxicities.  Denies any skin rash, diarrhea, abdominal pain.  Nasal congestion has completely resolved .  He reports breathing well.  Chronic shortness of breath with exertion and sometimes cough at baseline. He takes potassium chloride 40 mEq daily.  . Review of Systems  Constitutional: Negative for appetite change, chills, fatigue, fever and unexpected weight change.  [name]:   Negative for hearing loss and voice change.   Eyes: Negative for eye problems and icterus.  Respiratory: Positive for cough. Negative for chest tightness and shortness of breath.   Cardiovascular: Negative for chest pain and leg swelling.  Gastrointestinal: Negative for abdominal distention and abdominal pain.  Endocrine: Negative for hot flashes.  Genitourinary: Negative for difficulty urinating, dysuria and frequency.   Musculoskeletal: Negative for arthralgias.  Skin: Negative for itching and rash.  Neurological: Negative for light-headedness and numbness.  [name]: Negative for adenopathy. Does not bruise/bleed easily.   Psychiatric/Behavioral: Negative for confusion.     MEDICAL HISTORY:  Past Medical History:  Diagnosis Date  . Asthma   . Cancer ([place])    lung  . Cataract    left cataract surgery  . Dyspnea   . [name]   . GERD (gastroesophageal reflux disease)   . Hypertension   . Neuromuscular disorder ([place])    pt has knees bilateral with tendon issues that cause pain    SURGICAL HISTORY: Past Surgical History:  Procedure Laterality Date  . CARDIAC CATHETERIZATION N/A 12/16/2014   Procedure: Left Heart Cath;  Surgeon: [name], MD;  Location: [place] [facility];  Service: Cardiovascular;  Laterality: N/A;  . CYST REMOVAL TRUNK     chest and back over time and it was removed  . ENDOBRONCHIAL ULTRASOUND N/A 12/09/2017   Procedure: ENDOBRONCHIAL ULTRASOUND;  Surgeon: [name], MD;  Location: [name];  Service: Pulmonary;  Laterality: N/A;  . left cataract surgery    . PORTA CATH INSERTION N/A 12/23/2017   Procedure: PORTA CATH INSERTION;  Surgeon: [name], MD;  Location: [place] [facility];  Service: Cardiovascular;  Laterality: N/A;    SOCIAL HISTORY: Social History   Socioeconomic History  . Marital status: Divorced    Spouse name: Not on file  . Number of children: Not on file  . Years of education: Not on file  . Highest education level: Not on file  Occupational History  . Not on file  Social Needs  . Financial resource strain: Not on file  . Food insecurity:    Worry: Not on file    Inability: Not on file  . Transportation needs:    Medical: Not on file    Non-medical: Not  on file  Tobacco Use  . Smoking status: Former Smoker    Packs/day: 1.50    Years: 35.00    Pack years: 52.50    Types: Cigarettes    Last attempt to quit: 06/22/2015    Years since quitting: 3.2  . Smokeless tobacco: Never Used  Substance and Sexual Activity  . Alcohol use: Yes    Comment: a fifth in a week  . Drug use: No  . Sexual activity: Not Currently   Lifestyle  . Physical activity:    Days per week: Not on file    Minutes per session: Not on file  . Stress: Not on file  Relationships  . Social connections:    Talks on phone: Not on file    Gets together: Not on file    Attends religious service: Not on file    Active member of club or organization: Not on file    Attends meetings of clubs or organizations: Not on file    Relationship status: Not on file  . Intimate partner violence:    Fear of current or ex partner: Not on file    Emotionally abused: Not on file    Physically abused: Not on file    Forced sexual activity: Not on file  Other Topics Concern  . Not on file  Social History Narrative  . Not on file    FAMILY HISTORY: Family History  Problem Relation Age of Onset  . Hypertension Mother   . Breast cancer Mother   . Heart attack Mother   . Lung cancer Father   . Heart disease Sister   . Diabetes Sister   . Colon cancer Sister     ALLERGIES:  is allergic to penicillins.  MEDICATIONS:  Current Outpatient Medications  Medication Sig Dispense Refill  . acetaminophen (TYLENOL) 500 MG tablet Take 500 mg by mouth as needed.    [name] albuterol (PROVENTIL HFA;VENTOLIN HFA) 108 (90 Base) MCG/ACT inhaler Inhale 2 puffs into the lungs every 6 (six) hours as needed.  12  . amLODipine (NORVASC) 10 MG tablet Take 10 mg by mouth daily.    Jearl Klinefelter ELLIPTA 62.5-25 MCG/INH AEPB TAKE 1 PUFF BY MOUTH EVERY DAY 180 each 3  . aspirin 81 MG tablet Take 81 mg by mouth daily.    . benzonatate (TESSALON) 200 MG capsule TAKE 1 CAPSULE BY MOUTH EVERY DAY 3 TIMES A DAY AS NEEDED FOR COUGH    . carvedilol (COREG) 25 MG tablet Take 25 mg by mouth 2 (two) times daily with a meal.    . fluticasone (FLONASE) 50 MCG/ACT nasal spray PLACE 1 SPRAY INTO BOTH NOSTRILS 2 (TWO) TIMES DAILY  0  . gabapentin (NEURONTIN) 300 MG capsule Take 300 mg by mouth at bedtime as needed.     [name] ibuprofen (ADVIL,MOTRIN) 600 MG tablet Take 600 mg by mouth every 6  (six) hours as needed.    . lidocaine-prilocaine (EMLA) cream Apply to port area once 30 g 3  . lisinopril (PRINIVIL,ZESTRIL) 40 MG tablet Take 40 mg by mouth daily.    . mupirocin ointment (BACTROBAN) 2 % Place 1 application into the nose 3 (three) times daily. 15 g 0  . potassium chloride SA (KLOR-CON M20) 20 MEQ tablet Take 2 tablets (40 mEq total) by mouth daily. 60 tablet 2  . pravastatin (PRAVACHOL) 20 MG tablet Take 20 mg by mouth at bedtime.    . prochlorperazine (COMPAZINE) 10 MG tablet Take 1 tablet (10  mg total) by mouth every 6 (six) hours as needed (Nausea or vomiting). 30 tablet 1  . senna (SENOKOT) 8.6 MG TABS tablet Take 2 tablets (17.2 mg total) by mouth daily. 120 each 0  . tamsulosin (FLOMAX) 0.4 MG CAPS capsule Take 0.4 mg by mouth daily.     No current facility-administered medications for this visit.    Facility-Administered Medications Ordered in Other Visits  Medication Dose Route Frequency Provider Last Rate Last Dose  . sodium chloride flush (NS) 0.9 % injection 10 mL  10 mL Intravenous PRN [name], MD   10 mL at 03/18/18 0858     PHYSICAL EXAMINATION: ECOG PERFORMANCE STATUS: 0 - Asymptomatic Vitals:   09/16/18 0904  BP: (!) 145/79  Pulse: 71  Resp: 18  Temp: (!) 96.1 F (35.6 C)  SpO2: 94%   Filed Weights   09/16/18 0904  Weight: 216 lb 6.4 oz (98.2 kg)    Physical Exam Constitutional:      General: He is not in acute distress. [name]:     Head: Normocephalic and atraumatic.  Eyes:     General: No scleral icterus.    Conjunctiva/sclera: Conjunctivae normal.     Pupils: Pupils are equal, round, and reactive to light.  Neck:     Musculoskeletal: Normal range of motion and neck supple.  Cardiovascular:     Rate and Rhythm: Normal rate and regular rhythm.     Heart sounds: Normal heart sounds.  Pulmonary:     Effort: Pulmonary effort is normal. No respiratory distress.     Breath sounds: No wheezing or rales.     Comments: Decreased breath  sound bilaterally Chest:     Chest wall: No tenderness.  Abdominal:     General: Bowel sounds are normal. There is no distension.     Palpations: Abdomen is soft. There is no mass.     Tenderness: There is no abdominal tenderness.  Musculoskeletal: Normal range of motion.        General: No deformity.  Lymphadenopathy:     Cervical: No cervical adenopathy.  Skin:    General: Skin is warm and dry.     Findings: No erythema or rash.  Neurological:     Mental Status: He is alert and oriented to person, place, and time.     Cranial Nerves: No cranial nerve deficit.     Coordination: Coordination normal.  Psychiatric:        Behavior: Behavior normal.        Thought Content: Thought content normal.      LABORATORY DATA:  I have reviewed the data as listed Lab Results  Component Value Date   WBC 6.5 09/16/2018   HGB 13.9 09/16/2018   HCT 40.8 09/16/2018   MCV 84.5 09/16/2018   PLT 255 09/16/2018   Recent Labs    08/19/18 0901 09/02/18 0840 09/16/18 0839  NA 138 139 137  K 3.7 3.6 3.4*  CL 104 104 102  CO2 26 25 28   GLUCOSE 110* 123* 126*  BUN 12 12 12   CREATININE 0.84 0.79 0.97  CALCIUM 9.2 9.2 9.0  GFRNONAA >60 >60 >60  GFRAA >60 >60 >60  PROT 7.7 7.5 7.6  ALBUMIN 4.0 3.8 4.0  AST 20 23 23   ALT 19 14 18   ALKPHOS 59 59 61  BILITOT 0.6 0.8 0.9   RADIOGRAPHIC STUDIES: I have personally reviewed the radiological images as listed and agreed with the findings in the report.  03/21/2018 CT  chest showed mild decreasing size of the right upper lobe pulmonary nodule.  Interval decrease in size of right paratracheal and right hilar adenopathy.  06/30/2018 CT chest showed interval development of extensive changes secondary to external beam radiation within the posterior right upper lobe and superior segment of right lower lobe.  The enlarged right paratracheal lymph node is decreased in size in the interval.  Right upper lobe lung lesion is difficult to separate from  surrounding changes due to radiation but is favored to have decrease in size in the interval.  Aortic atherosclerosis and emphysema.  LAD coronary artery atherosclerotic calcification.  08/06/2018 CT head without contrast Brain parenchymal appears unremarkable there is no mass or hemorrhage.   ASSESSMENT & PLAN:  1. Primary lung adenocarcinoma, right ([place])   2. Encounter for antineoplastic immunotherapy   3. Hypokalemia   T3 N1 M0 lung adenocarcinoma, s/p concurrent chemoradiation.   #Stage III lung adenocarcinoma, Tolerates immunotherapy very well.  No immunotherapy related toxicities. Labs are reviewed and discussed with patient.  Counts today are acceptable for maintenance durvalumab. Plan total of 26 cycles. TSH has been monitored and has been stable. Will repeat surveillance CT scan in early April 2020   #Hypokalemia due to electric wasting..  Potassium is 3.4 today.   potassium chloride 40 mEq daily.  Return of visit: 2 weeks for evaluation prior to next durvalumab treatment.  [name], MD, PhD Hematology Oncology [facility] at [facility] Pager- [phone] 09/16/2018

## 2018-09-18 DIAGNOSIS — E559 Vitamin D deficiency, unspecified: Secondary | ICD-10-CM | POA: Insufficient documentation

## 2018-09-29 ENCOUNTER — Other Ambulatory Visit: Payer: Self-pay

## 2018-09-30 ENCOUNTER — Inpatient Hospital Stay (HOSPITAL_BASED_OUTPATIENT_CLINIC_OR_DEPARTMENT_OTHER): Payer: Medicare Other | Admitting: Oncology

## 2018-09-30 ENCOUNTER — Inpatient Hospital Stay: Payer: Medicare Other

## 2018-09-30 ENCOUNTER — Other Ambulatory Visit: Payer: Self-pay

## 2018-09-30 ENCOUNTER — Encounter: Payer: Self-pay | Admitting: Oncology

## 2018-09-30 VITALS — BP 138/74 | HR 67 | Temp 96.7°F | Resp 18 | Wt 216.7 lb

## 2018-09-30 DIAGNOSIS — E876 Hypokalemia: Secondary | ICD-10-CM

## 2018-09-30 DIAGNOSIS — C3491 Malignant neoplasm of unspecified part of right bronchus or lung: Secondary | ICD-10-CM

## 2018-09-30 DIAGNOSIS — Z5112 Encounter for antineoplastic immunotherapy: Secondary | ICD-10-CM | POA: Diagnosis not present

## 2018-09-30 DIAGNOSIS — R0609 Other forms of dyspnea: Secondary | ICD-10-CM

## 2018-09-30 DIAGNOSIS — Z95828 Presence of other vascular implants and grafts: Secondary | ICD-10-CM

## 2018-09-30 DIAGNOSIS — R05 Cough: Secondary | ICD-10-CM | POA: Diagnosis not present

## 2018-09-30 DIAGNOSIS — Z87891 Personal history of nicotine dependence: Secondary | ICD-10-CM

## 2018-09-30 LAB — COMPREHENSIVE METABOLIC PANEL
ALBUMIN: 4 g/dL (ref 3.5–5.0)
ALT: 18 U/L (ref 0–44)
AST: 21 U/L (ref 15–41)
Alkaline Phosphatase: 62 U/L (ref 38–126)
Anion gap: 7 (ref 5–15)
BILIRUBIN TOTAL: 0.6 mg/dL (ref 0.3–1.2)
BUN: 14 mg/dL (ref 8–23)
CALCIUM: 9.2 mg/dL (ref 8.9–10.3)
CO2: 27 mmol/L (ref 22–32)
Chloride: 103 mmol/L (ref 98–111)
Creatinine, Ser: 0.95 mg/dL (ref 0.61–1.24)
GFR calc Af Amer: >60 mL/min (ref >60)
GFR calc non Af Amer: >60 mL/min (ref >60)
Glucose, Bld: 111 mg/dL — ABNORMAL HIGH (ref 70–99)
Potassium: 3.9 mmol/L (ref 3.5–5.1)
Sodium: 137 mmol/L (ref 135–145)
Total Protein: 7.7 g/dL (ref 6.5–8.1)

## 2018-09-30 LAB — CBC WITH DIFFERENTIAL/PLATELET
Abs Immature Granulocytes: 0.01 10*3/uL (ref 0.00–0.07)
Basophils Absolute: 0 10*3/uL (ref 0.0–0.1)
Basophils Relative: 1 %
Eosinophils Absolute: 0.1 10*3/uL (ref 0.0–0.5)
Eosinophils Relative: 1 %
HEMATOCRIT: 39.6 % (ref 39.0–52.0)
Hemoglobin: 13.7 g/dL (ref 13.0–17.0)
Immature Granulocytes: 0 %
Lymphocytes Relative: 11 %
Lymphs Abs: 0.6 10*3/uL — ABNORMAL LOW (ref 0.7–4.0)
MCH: 29.6 pg (ref 26.0–34.0)
MCHC: 34.6 g/dL (ref 30.0–36.0)
MCV: 85.5 fL (ref 80.0–100.0)
Monocytes Absolute: 0.9 10*3/uL (ref 0.1–1.0)
Monocytes Relative: 15 %
Neutro Abs: 4.2 10*3/uL (ref 1.7–7.7)
Neutrophils Relative %: 72 %
Platelets: 247 10*3/uL (ref 150–400)
RBC: 4.63 MIL/uL (ref 4.22–5.81)
RDW: 14.5 % (ref 11.5–15.5)
WBC: 5.8 10*3/uL (ref 4.0–10.5)
nRBC: 0 % (ref 0.0–0.2)

## 2018-09-30 LAB — TSH: TSH: 3.144 u[IU]/mL (ref 0.350–4.500)

## 2018-09-30 MED ORDER — SODIUM CHLORIDE 0.9 % IV SOLN
10.0000 mg/kg | Freq: Once | INTRAVENOUS | Status: AC
  Administered 2018-09-30: 960 mg via INTRAVENOUS
  Filled 2018-09-30: qty 10

## 2018-09-30 MED ORDER — HEPARIN SOD (PORK) LOCK FLUSH 100 UNIT/ML IV SOLN
500.0000 [IU] | Freq: Once | INTRAVENOUS | Status: AC | PRN
  Administered 2018-09-30: 500 [IU]
  Filled 2018-09-30: qty 5

## 2018-09-30 MED ORDER — SODIUM CHLORIDE 0.9 % IV SOLN
Freq: Once | INTRAVENOUS | Status: AC
  Administered 2018-09-30: 10:00:00 via INTRAVENOUS
  Filled 2018-09-30: qty 250

## 2018-09-30 MED ORDER — SODIUM CHLORIDE 0.9% FLUSH
10.0000 mL | Freq: Once | INTRAVENOUS | Status: AC
  Administered 2018-09-30: 10 mL via INTRAVENOUS
  Filled 2018-09-30: qty 10

## 2018-09-30 NOTE — Progress Notes (Signed)
Hematology/Oncology Follow up note [facility] Telephone:(336) [phone] Fax:(336) [phone]   Patient Care Team: [name], MD as PCP - General (Internal Medicine) [name], RN as Registered Nurse   REASON FOR VISIT Follow up for treatment of Stage III lung adenocarcinoma  Oncology History:  Mark Mccarty is a  68 y.o.  male with  adenocarcinoma of lung, diagnosed via biopsy of right hilar lymph nodes.  Former 43 pack year smoking history.  Presents for assessment prior to concurrent chemo T3 N1 M0  INTERVAL HISTORY Mark Mccarty is a 68 y.o. male who has above history reviewed by me presents for assessment prior to immunotherapy with durvalumab for treatment of stage III non-small cell lung cancer.  Patient continues to do well.  No complaints today. He tolerates immunotherapy without significant clinical signs of immunotherapy related toxicities. Denies any skin rash, diarrhea, abdominal pain, nasal congestion has completely resolved .  He reports breathing well.  Chronic shortness of breath with exertion and sometimes cough at baseline. He takes potassium chloride 40 M equivalent daily.   . Review of Systems  Constitutional: Negative for appetite change, chills, fatigue, fever and unexpected weight change.  [name]:   Negative for hearing loss and voice change.   Eyes: Negative for eye problems and icterus.  Respiratory: Positive for cough. Negative for chest tightness and shortness of breath.   Cardiovascular: Negative for chest pain and leg swelling.  Gastrointestinal: Negative for abdominal distention and abdominal pain.  Endocrine: Negative for hot flashes.  Genitourinary: Negative for difficulty urinating, dysuria and frequency.   Musculoskeletal: Negative for arthralgias.  Skin: Negative for itching and rash.  Neurological: Negative for light-headedness and numbness.  [name]: Negative for adenopathy. Does not bruise/bleed  easily.  Psychiatric/Behavioral: Negative for confusion.     MEDICAL HISTORY:  Past Medical History:  Diagnosis Date  . Asthma   . Cancer ([place])    lung  . Cataract    left cataract surgery  . Dyspnea   . [name]   . GERD (gastroesophageal reflux disease)   . Hypertension   . Neuromuscular disorder ([place])    pt has knees bilateral with tendon issues that cause pain    SURGICAL HISTORY: Past Surgical History:  Procedure Laterality Date  . CARDIAC CATHETERIZATION N/A 12/16/2014   Procedure: Left Heart Cath;  Surgeon: [name], MD;  Location: [place] [facility];  Service: Cardiovascular;  Laterality: N/A;  . CYST REMOVAL TRUNK     chest and back over time and it was removed  . ENDOBRONCHIAL ULTRASOUND N/A 12/09/2017   Procedure: ENDOBRONCHIAL ULTRASOUND;  Surgeon: [name], MD;  Location: [name];  Service: Pulmonary;  Laterality: N/A;  . left cataract surgery    . PORTA CATH INSERTION N/A 12/23/2017   Procedure: PORTA CATH INSERTION;  Surgeon: [name], MD;  Location: [place] [facility];  Service: Cardiovascular;  Laterality: N/A;    SOCIAL HISTORY: Social History   Socioeconomic History  . Marital status: Divorced    Spouse name: Not on file  . Number of children: Not on file  . Years of education: Not on file  . Highest education level: Not on file  Occupational History  . Not on file  Social Needs  . Financial resource strain: Not on file  . Food insecurity:    Worry: Not on file    Inability: Not on file  . Transportation needs:    Medical: Not on file    Non-medical:  Not on file  Tobacco Use  . Smoking status: Former Smoker    Packs/day: 1.50    Years: 35.00    Pack years: 52.50    Types: Cigarettes    Last attempt to quit: 06/22/2015    Years since quitting: 3.2  . Smokeless tobacco: Never Used  Substance and Sexual Activity  . Alcohol use: Yes    Comment: a fifth in a week  . Drug use: No  . Sexual activity: Not  Currently  Lifestyle  . Physical activity:    Days per week: Not on file    Minutes per session: Not on file  . Stress: Not on file  Relationships  . Social connections:    Talks on phone: Not on file    Gets together: Not on file    Attends religious service: Not on file    Active member of club or organization: Not on file    Attends meetings of clubs or organizations: Not on file    Relationship status: Not on file  . Intimate partner violence:    Fear of current or ex partner: Not on file    Emotionally abused: Not on file    Physically abused: Not on file    Forced sexual activity: Not on file  Other Topics Concern  . Not on file  Social History Narrative  . Not on file    FAMILY HISTORY: Family History  Problem Relation Age of Onset  . Hypertension Mother   . Breast cancer Mother   . Heart attack Mother   . Lung cancer Father   . Heart disease Sister   . Diabetes Sister   . Colon cancer Sister     ALLERGIES:  is allergic to penicillins.  MEDICATIONS:  Current Outpatient Medications  Medication Sig Dispense Refill  . acetaminophen (TYLENOL) 500 MG tablet Take 500 mg by mouth as needed.    [name] albuterol (PROVENTIL HFA;VENTOLIN HFA) 108 (90 Base) MCG/ACT inhaler Inhale 2 puffs into the lungs every 6 (six) hours as needed.  12  . amLODipine (NORVASC) 10 MG tablet Take 10 mg by mouth daily.    Jearl Klinefelter ELLIPTA 62.5-25 MCG/INH AEPB TAKE 1 PUFF BY MOUTH EVERY DAY 180 each 3  . aspirin 81 MG tablet Take 81 mg by mouth daily.    . benzonatate (TESSALON) 200 MG capsule TAKE 1 CAPSULE BY MOUTH EVERY DAY 3 TIMES A DAY AS NEEDED FOR COUGH    . carvedilol (COREG) 25 MG tablet Take 25 mg by mouth 2 (two) times daily with a meal.    . fluticasone (FLONASE) 50 MCG/ACT nasal spray PLACE 1 SPRAY INTO BOTH NOSTRILS 2 (TWO) TIMES DAILY  0  . gabapentin (NEURONTIN) 300 MG capsule Take 300 mg by mouth at bedtime as needed.     [name] ibuprofen (ADVIL,MOTRIN) 600 MG tablet Take 600 mg by  mouth every 6 (six) hours as needed.    . lidocaine-prilocaine (EMLA) cream Apply to port area once 30 g 3  . lisinopril (PRINIVIL,ZESTRIL) 40 MG tablet Take 40 mg by mouth daily.    . mupirocin ointment (BACTROBAN) 2 % Place 1 application into the nose 3 (three) times daily. 15 g 0  . potassium chloride SA (KLOR-CON M20) 20 MEQ tablet Take 2 tablets (40 mEq total) by mouth daily. 60 tablet 2  . pravastatin (PRAVACHOL) 20 MG tablet Take 20 mg by mouth at bedtime.    . prochlorperazine (COMPAZINE) 10 MG tablet Take 1 tablet (  10 mg total) by mouth every 6 (six) hours as needed (Nausea or vomiting). 30 tablet 1  . senna (SENOKOT) 8.6 MG TABS tablet Take 2 tablets (17.2 mg total) by mouth daily. 120 each 0  . tamsulosin (FLOMAX) 0.4 MG CAPS capsule Take 0.4 mg by mouth daily.     No current facility-administered medications for this visit.    Facility-Administered Medications Ordered in Other Visits  Medication Dose Route Frequency Provider Last Rate Last Dose  . sodium chloride flush (NS) 0.9 % injection 10 mL  10 mL Intravenous PRN [name], MD   10 mL at 03/18/18 0858     PHYSICAL EXAMINATION: ECOG PERFORMANCE STATUS: 0 - Asymptomatic Vitals:   09/30/18 0906  BP: 138/74  Pulse: 67  Resp: 18  Temp: (!) 96.7 F (35.9 C)  SpO2: 95%   Filed Weights   09/30/18 0906  Weight: 216 lb 11.2 oz (98.3 kg)    Physical Exam Constitutional:      General: He is not in acute distress. [name]:     Head: Normocephalic and atraumatic.  Eyes:     General: No scleral icterus.    Conjunctiva/sclera: Conjunctivae normal.     Pupils: Pupils are equal, round, and reactive to light.  Neck:     Musculoskeletal: Normal range of motion and neck supple.  Cardiovascular:     Rate and Rhythm: Normal rate and regular rhythm.     Heart sounds: Normal heart sounds.  Pulmonary:     Effort: Pulmonary effort is normal. No respiratory distress.     Breath sounds: No wheezing or rales.     Comments: Decreased  breath sound bilaterally Chest:     Chest wall: No tenderness.  Abdominal:     General: Bowel sounds are normal. There is no distension.     Palpations: Abdomen is soft. There is no mass.     Tenderness: There is no abdominal tenderness.  Musculoskeletal: Normal range of motion.        General: No deformity.  Lymphadenopathy:     Cervical: No cervical adenopathy.  Skin:    General: Skin is warm and dry.     Findings: No erythema or rash.  Neurological:     Mental Status: He is alert and oriented to person, place, and time.     Cranial Nerves: No cranial nerve deficit.     Coordination: Coordination normal.  Psychiatric:        Behavior: Behavior normal.        Thought Content: Thought content normal.      LABORATORY DATA:  I have reviewed the data as listed Lab Results  Component Value Date   WBC 5.8 09/30/2018   HGB 13.7 09/30/2018   HCT 39.6 09/30/2018   MCV 85.5 09/30/2018   PLT 247 09/30/2018   Recent Labs    09/02/18 0840 09/16/18 0839 09/30/18 0851  NA 139 137 137  K 3.6 3.4* 3.9  CL 104 102 103  CO2 25 28 27   GLUCOSE 123* 126* 111*  BUN 12 12 14   CREATININE 0.79 0.97 0.95  CALCIUM 9.2 9.0 9.2  GFRNONAA >60 >60 >60  GFRAA >60 >60 >60  PROT 7.5 7.6 7.7  ALBUMIN 3.8 4.0 4.0  AST 23 23 21   ALT 14 18 18   ALKPHOS 59 61 62  BILITOT 0.8 0.9 0.6   RADIOGRAPHIC STUDIES: I have personally reviewed the radiological images as listed and agreed with the findings in the report.  03/21/2018 CT  chest showed mild decreasing size of the right upper lobe pulmonary nodule.  Interval decrease in size of right paratracheal and right hilar adenopathy.  06/30/2018 CT chest showed interval development of extensive changes secondary to external beam radiation within the posterior right upper lobe and superior segment of right lower lobe.  The enlarged right paratracheal lymph node is decreased in size in the interval.  Right upper lobe lung lesion is difficult to separate  from surrounding changes due to radiation but is favored to have decrease in size in the interval.  Aortic atherosclerosis and emphysema.  LAD coronary artery atherosclerotic calcification.  08/06/2018 CT head without contrast Brain parenchymal appears unremarkable there is no mass or hemorrhage.   ASSESSMENT & PLAN:  1. Primary lung adenocarcinoma, right ([place])   2. Encounter for antineoplastic immunotherapy   3. Port-A-Cath in place   4. Hypokalemia   T3 N1 M0 lung adenocarcinoma, s/p concurrent chemoradiation.   #Stage III lung adenocarcinoma, Patient tolerates immunotherapy well.  No immunotherapy related toxicities. Labs are reviewed and discussed with patient.  Counts today are acceptable for maintenance durvalumab. Plan total of 26 cycles. Cycle 14 TSH has been monitored and has been stable .  We will repeat surveillance CT scan in early April 2020 He has already had appointment scheduled.  #Hypokalemia due to electric wasting.  Potassium has improved.  Continue potassium chloride 40 mEq daily.  Return visit, 2 weeks for evaluation prior to next durvalumab treatment.   We spent sufficient time to discuss many aspect of care, questions were answered to patient's satisfaction. Total face to face encounter time for this patient visit was 25 min. >50% of the time was  spent in counseling and coordination of care.  [name], MD, PhD Hematology Oncology [facility] at [facility] Pager- [phone] 09/30/2018

## 2018-09-30 NOTE — Progress Notes (Signed)
Patient here for follow up. No concerns voiced.  °

## 2018-10-06 ENCOUNTER — Telehealth: Payer: Self-pay | Admitting: *Deleted

## 2018-10-06 NOTE — Telephone Encounter (Signed)
Fax from [facility] reporting that patient states that we sent prescription for Vit D 3 50,000units to [facility] and he wanted it sent to [facility]. I do not find any prescription in his chart nor any mention of starting him on it in the office note.Please advise

## 2018-10-06 NOTE — Telephone Encounter (Signed)
I don't think I ordered Vitamin D for him. Maybe from other providers.

## 2018-10-06 NOTE — Telephone Encounter (Signed)
Patient states he called the wrong office and has called [place] for the medicine

## 2018-10-08 ENCOUNTER — Ambulatory Visit
Admission: RE | Admit: 2018-10-08 | Discharge: 2018-10-08 | Disposition: A | Discharge status: Home / Self Care | Payer: Medicare Other | Source: Ambulatory Visit | Attending: Oncology | Admitting: Oncology

## 2018-10-08 ENCOUNTER — Other Ambulatory Visit: Payer: Self-pay

## 2018-10-08 DIAGNOSIS — C3491 Malignant neoplasm of unspecified part of right bronchus or lung: Secondary | ICD-10-CM

## 2018-10-13 ENCOUNTER — Encounter: Payer: Self-pay | Admitting: Oncology

## 2018-10-13 ENCOUNTER — Other Ambulatory Visit: Payer: Self-pay

## 2018-10-13 ENCOUNTER — Inpatient Hospital Stay: Payer: Medicare Other | Attending: Oncology | Admitting: Oncology

## 2018-10-13 DIAGNOSIS — C3491 Malignant neoplasm of unspecified part of right bronchus or lung: Secondary | ICD-10-CM | POA: Diagnosis not present

## 2018-10-13 DIAGNOSIS — Z5112 Encounter for antineoplastic immunotherapy: Secondary | ICD-10-CM | POA: Insufficient documentation

## 2018-10-13 DIAGNOSIS — Z79899 Other long term (current) drug therapy: Secondary | ICD-10-CM | POA: Insufficient documentation

## 2018-10-13 NOTE — Progress Notes (Signed)
Called patient for Televisit.  Patient states no new concerns today

## 2018-10-13 NOTE — Progress Notes (Signed)
HEMATOLOGY-ONCOLOGY TeleHEALTH VISIT PROGRESS NOTE  I connected with Mark Mccarty on 10/13/18 at 11:45 AM EDT by telephone visit and verified that I am speaking with the correct person using two identifiers. I discussed the limitations, risks, security and privacy concerns of performing an evaluation and management service by telemedicine and the availability of in-person appointments. I also discussed with the patient that there may be a patient responsible charge related to this service. The patient expressed understanding and agreed to proceed.   Other persons participating in the visit and their role in the encounter:  Geraldine Solar, [place], check in patient   Janeann Merl, RN, check in patient.   Patient's location: Home  Provider's location: home  Chief Complaint: Follow-up for evaluation prior to immunotherapy  INTERVAL HISTORY Mark Mccarty is a 68 y.o. male who has above history reviewed by me today presents for follow up visit for management of evaluation prior to maintenance durvalumab treatment for Problems and complaints are listed below:  He supposed to have a WebEx video visit with me today.  Due to technical difficulties, we proceed with a telephone visit. Patient has been on immunotherapy maintenance with durvalumab today is his cycle 15 treatment. He reports doing well.  Denies any skin rash, diarrhea, chest pain, cough or shortness of breath more than his baseline. No new complaints today.  Review of Systems  Constitutional: Negative for chills, diaphoresis, fatigue, fever and unexpected weight change.  [name]:   Negative for hearing loss, lump/mass, nosebleeds and sore throat.   Eyes: Negative for eye problems and icterus.  Respiratory: Negative for chest tightness, cough, hemoptysis, shortness of breath and wheezing.   Cardiovascular: Negative for chest pain and leg swelling.  Gastrointestinal: Negative for abdominal distention, abdominal pain, blood in stool,  diarrhea, nausea and rectal pain.  Endocrine: Negative for hot flashes.  Genitourinary: Negative for bladder incontinence, difficulty urinating, dysuria, frequency, hematuria and nocturia.   Musculoskeletal: Negative for back pain, flank pain, gait problem and myalgias.  Skin: Negative for rash.  Neurological: Negative for dizziness, gait problem, headaches, numbness and seizures.  [name]: Negative for adenopathy. Does not bruise/bleed easily.  Psychiatric/Behavioral: Negative for confusion and decreased concentration. The patient is not nervous/anxious.     Past Medical History:  Diagnosis Date  . Asthma   . Cancer ([place])    lung  . Cataract    left cataract surgery  . Dyspnea   . [name]   . GERD (gastroesophageal reflux disease)   . Hypertension   . Neuromuscular disorder ([place])    pt has knees bilateral with tendon issues that cause pain   Past Surgical History:  Procedure Laterality Date  . CARDIAC CATHETERIZATION N/A 12/16/2014   Procedure: Left Heart Cath;  Surgeon: [name], MD;  Location: [facility];  Service: Cardiovascular;  Laterality: N/A;  . CYST REMOVAL TRUNK     chest and back over time and it was removed  . ENDOBRONCHIAL ULTRASOUND N/A 12/09/2017   Procedure: ENDOBRONCHIAL ULTRASOUND;  Surgeon: [name], MD;  Location: [name];  Service: Pulmonary;  Laterality: N/A;  . left cataract surgery    . PORTA CATH INSERTION N/A 12/23/2017   Procedure: PORTA CATH INSERTION;  Surgeon: [name], MD;  Location: [facility];  Service: Cardiovascular;  Laterality: N/A;    Family History  Problem Relation Age of Onset  . Hypertension Mother   . Breast cancer Mother   . Heart attack Mother   . Lung  cancer Father   . Heart disease Sister   . Diabetes Sister   . Colon cancer Sister     Social History   Socioeconomic History  . Marital status: Divorced    Spouse name: Not on file  . Number of children: Not on file   . Years of education: Not on file  . Highest education level: Not on file  Occupational History  . Not on file  Social Needs  . Financial resource strain: Not on file  . Food insecurity:    Worry: Not on file    Inability: Not on file  . Transportation needs:    Medical: Not on file    Non-medical: Not on file  Tobacco Use  . Smoking status: Former Smoker    Packs/day: 1.50    Years: 35.00    Pack years: 52.50    Types: Cigarettes    Last attempt to quit: 06/22/2015    Years since quitting: 3.3  . Smokeless tobacco: Never Used  Substance and Sexual Activity  . Alcohol use: Yes    Comment: a fifth in a week  . Drug use: No  . Sexual activity: Not Currently  Lifestyle  . Physical activity:    Days per week: Not on file    Minutes per session: Not on file  . Stress: Not on file  Relationships  . Social connections:    Talks on phone: Not on file    Gets together: Not on file    Attends religious service: Not on file    Active member of club or organization: Not on file    Attends meetings of clubs or organizations: Not on file    Relationship status: Not on file  . Intimate partner violence:    Fear of current or ex partner: Not on file    Emotionally abused: Not on file    Physically abused: Not on file    Forced sexual activity: Not on file  Other Topics Concern  . Not on file  Social History Narrative  . Not on file    Current Outpatient Medications on File Prior to Visit  Medication Sig Dispense Refill  . acetaminophen (TYLENOL) 500 MG tablet Take 500 mg by mouth as needed.    [name] albuterol (PROVENTIL HFA;VENTOLIN HFA) 108 (90 Base) MCG/ACT inhaler Inhale 2 puffs into the lungs every 6 (six) hours as needed.  12  . amLODipine (NORVASC) 10 MG tablet Take 10 mg by mouth daily.    Jearl Klinefelter ELLIPTA 62.5-25 MCG/INH AEPB TAKE 1 PUFF BY MOUTH EVERY DAY 180 each 3  . aspirin 81 MG tablet Take 81 mg by mouth daily.    . carvedilol (COREG) 25 MG tablet Take 25 mg by  mouth 2 (two) times daily with a meal.    . fluticasone (FLONASE) 50 MCG/ACT nasal spray PLACE 1 SPRAY INTO BOTH NOSTRILS 2 (TWO) TIMES DAILY  0  . gabapentin (NEURONTIN) 300 MG capsule Take 300 mg by mouth at bedtime as needed.     [name] ibuprofen (ADVIL,MOTRIN) 600 MG tablet Take 600 mg by mouth every 6 (six) hours as needed.    . lidocaine-prilocaine (EMLA) cream Apply to port area once 30 g 3  . lisinopril (PRINIVIL,ZESTRIL) 40 MG tablet Take 40 mg by mouth daily.    . potassium chloride SA (KLOR-CON M20) 20 MEQ tablet Take 2 tablets (40 mEq total) by mouth daily. 60 tablet 2  . pravastatin (PRAVACHOL) 20 MG tablet Take 20  mg by mouth at bedtime.    . tamsulosin (FLOMAX) 0.4 MG CAPS capsule Take 0.4 mg by mouth daily.    . Vitamin D, Ergocalciferol, (DRISDOL) 1.25 MG (50000 UT) CAPS capsule Take by mouth.    . prochlorperazine (COMPAZINE) 10 MG tablet Take 1 tablet (10 mg total) by mouth every 6 (six) hours as needed (Nausea or vomiting). (Patient not taking: Reported on 10/13/2018) 30 tablet 1  . senna (SENOKOT) 8.6 MG TABS tablet Take 2 tablets (17.2 mg total) by mouth daily. (Patient not taking: Reported on 10/13/2018) 120 each 0   Current Facility-Administered Medications on File Prior to Visit  Medication Dose Route Frequency Provider Last Rate Last Dose  . sodium chloride flush (NS) 0.9 % injection 10 mL  10 mL Intravenous PRN [name], MD   10 mL at 03/18/18 0858    Allergies  Allergen Reactions  . Penicillins Rash    Stomach hurt Has patient had a PCN reaction causing immediate rash, facial/tongue/throat swelling, SOB or lightheadedness with hypotension: yes Has patient had a PCN reaction causing severe rash involving mucus membranes or skin necrosis: no Has patient had a PCN reaction that required hospitalization: no Has patient had a PCN reaction occurring within the last 10 years: yes If all of the above answers are "NO", then may proceed with Cephalosporin use.         Observations/Objective: There were no vitals filed for this visit. There is no height or weight on file to calculate BMI.   CBC    Component Value Date/Time   WBC 5.8 09/30/2018 0851   RBC 4.63 09/30/2018 0851   HGB 13.7 09/30/2018 0851   HGB 15.8 07/10/2014 1328   HCT 39.6 09/30/2018 0851   HCT 47.2 07/10/2014 1328   PLT 247 09/30/2018 0851   PLT 236 07/10/2014 1328   MCV 85.5 09/30/2018 0851   MCV 93 07/10/2014 1328   MCH 29.6 09/30/2018 0851   MCHC 34.6 09/30/2018 0851   RDW 14.5 09/30/2018 0851   RDW 13.9 07/10/2014 1328   LYMPHSABS 0.6 (L) 09/30/2018 0851   LYMPHSABS 2.0 07/10/2014 1328   MONOABS 0.9 09/30/2018 0851   MONOABS 0.8 07/10/2014 1328   EOSABS 0.1 09/30/2018 0851   EOSABS 0.1 07/10/2014 1328   BASOSABS 0.0 09/30/2018 0851   BASOSABS 0.1 07/10/2014 1328    CMP     Component Value Date/Time   NA 137 09/30/2018 0851   NA 138 07/10/2014 1328   K 3.9 09/30/2018 0851   K 3.1 (L) 07/10/2014 1328   CL 103 09/30/2018 0851   CL 101 07/10/2014 1328   CO2 27 09/30/2018 0851   CO2 31 07/10/2014 1328   GLUCOSE 111 (H) 09/30/2018 0851   GLUCOSE 164 (H) 07/10/2014 1328   BUN 14 09/30/2018 0851   BUN 14 07/10/2014 1328   CREATININE 0.95 09/30/2018 0851   CREATININE 1.16 07/10/2014 1328   CALCIUM 9.2 09/30/2018 0851   CALCIUM 8.9 07/10/2014 1328   PROT 7.7 09/30/2018 0851   PROT 7.4 07/10/2014 1328   ALBUMIN 4.0 09/30/2018 0851   ALBUMIN 3.4 07/10/2014 1328   AST 21 09/30/2018 0851   AST 22 07/10/2014 1328   ALT 18 09/30/2018 0851   ALT 19 07/10/2014 1328   ALKPHOS 62 09/30/2018 0851   ALKPHOS 60 07/10/2014 1328   BILITOT 0.6 09/30/2018 0851   BILITOT 0.5 07/10/2014 1328   GFRNONAA >60 09/30/2018 0851   GFRNONAA >60 07/10/2014 1328   GFRAA >60 09/30/2018  [place] >60 07/10/2014 1328     Assessment and Plan: 1. Primary lung adenocarcinoma, right ([place])   2. Encounter for antineoplastic immunotherapy     Patient appears doing well and tolerating  with maintenance durvalumab treatment. We have discussed that he will need a total 26 treatment and currently this is cycle #15. No clinical signs of immunotherapy related toxicities. Proceed with lab work on 10/14/2018.  If labs and vital signs meeting treatment criteria, he will proceed with cycle 15 immunotherapy with durvalumab on 10/14/2018.   Follow Up Instructions: 2 weeks for lab, MD assessment prior to next cycle of durvalumab treatment.   I discussed the assessment and treatment plan with the patient. The patient was provided an opportunity to ask questions and all were answered. The patient agreed with the plan and demonstrated an understanding of the instructions.  The patient was advised to call back or seek an in-person evaluation if the symptoms worsen or if the condition fails to improve as anticipated.   I provided 15 minutes of non face-to-face telephone visit time during this encounter, and > 50% was spent counseling as documented under my assessment & plan.  [name], MD 10/13/2018 6:12 PM

## 2018-10-14 ENCOUNTER — Inpatient Hospital Stay: Payer: Medicare Other | Admitting: Oncology

## 2018-10-14 ENCOUNTER — Other Ambulatory Visit: Payer: Self-pay | Admitting: Internal Medicine

## 2018-10-14 ENCOUNTER — Inpatient Hospital Stay: Payer: Medicare Other

## 2018-10-14 ENCOUNTER — Other Ambulatory Visit: Payer: Self-pay

## 2018-10-14 VITALS — BP 134/85 | HR 66 | Resp 20 | Wt 218.0 lb

## 2018-10-14 DIAGNOSIS — C3491 Malignant neoplasm of unspecified part of right bronchus or lung: Secondary | ICD-10-CM

## 2018-10-14 DIAGNOSIS — Z5112 Encounter for antineoplastic immunotherapy: Secondary | ICD-10-CM | POA: Diagnosis present

## 2018-10-14 DIAGNOSIS — E876 Hypokalemia: Secondary | ICD-10-CM

## 2018-10-14 DIAGNOSIS — Z79899 Other long term (current) drug therapy: Secondary | ICD-10-CM | POA: Diagnosis not present

## 2018-10-14 LAB — COMPREHENSIVE METABOLIC PANEL
ALT: 16 U/L (ref 0–44)
AST: 22 U/L (ref 15–41)
Albumin: 4.1 g/dL (ref 3.5–5.0)
Alkaline Phosphatase: 70 U/L (ref 38–126)
Anion gap: 8 (ref 5–15)
BUN: 14 mg/dL (ref 8–23)
CO2: 26 mmol/L (ref 22–32)
Calcium: 9.3 mg/dL (ref 8.9–10.3)
Chloride: 102 mmol/L (ref 98–111)
Creatinine, Ser: 0.94 mg/dL (ref 0.61–1.24)
GFR calc Af Amer: >60 mL/min (ref >60)
GFR calc non Af Amer: >60 mL/min (ref >60)
Glucose, Bld: 124 mg/dL — ABNORMAL HIGH (ref 70–99)
Potassium: 3.4 mmol/L — ABNORMAL LOW (ref 3.5–5.1)
Sodium: 136 mmol/L (ref 135–145)
Total Bilirubin: 0.5 mg/dL (ref 0.3–1.2)
Total Protein: 7.5 g/dL (ref 6.5–8.1)

## 2018-10-14 LAB — CBC WITH DIFFERENTIAL/PLATELET
Abs Immature Granulocytes: 0.02 10*3/uL (ref 0.00–0.07)
Basophils Absolute: 0 10*3/uL (ref 0.0–0.1)
Basophils Relative: 0 %
Eosinophils Absolute: 0.1 10*3/uL (ref 0.0–0.5)
Eosinophils Relative: 2 %
HCT: 38.8 % — ABNORMAL LOW (ref 39.0–52.0)
Hemoglobin: 13.6 g/dL (ref 13.0–17.0)
Immature Granulocytes: 0 %
Lymphocytes Relative: 12 %
Lymphs Abs: 0.7 10*3/uL (ref 0.7–4.0)
MCH: 29.9 pg (ref 26.0–34.0)
MCHC: 35.1 g/dL (ref 30.0–36.0)
MCV: 85.3 fL (ref 80.0–100.0)
Monocytes Absolute: 0.8 10*3/uL (ref 0.1–1.0)
Monocytes Relative: 13 %
Neutro Abs: 4.1 10*3/uL (ref 1.7–7.7)
Neutrophils Relative %: 73 %
Platelets: 266 10*3/uL (ref 150–400)
RBC: 4.55 MIL/uL (ref 4.22–5.81)
RDW: 14 % (ref 11.5–15.5)
WBC: 5.7 10*3/uL (ref 4.0–10.5)
nRBC: 0 % (ref 0.0–0.2)

## 2018-10-14 LAB — TSH: TSH: 3.201 u[IU]/mL (ref 0.350–4.500)

## 2018-10-14 MED ORDER — HEPARIN SOD (PORK) LOCK FLUSH 100 UNIT/ML IV SOLN
500.0000 [IU] | Freq: Once | INTRAVENOUS | Status: AC | PRN
  Administered 2018-10-14: 12:00:00 500 [IU]

## 2018-10-14 MED ORDER — SODIUM CHLORIDE 0.9 % IV SOLN
Freq: Once | INTRAVENOUS | Status: AC
  Administered 2018-10-14: 10:00:00 via INTRAVENOUS
  Filled 2018-10-14: qty 250

## 2018-10-14 MED ORDER — SODIUM CHLORIDE 0.9 % IV SOLN
10.0000 mg/kg | Freq: Once | INTRAVENOUS | Status: AC
  Administered 2018-10-14: 960 mg via INTRAVENOUS
  Filled 2018-10-14: qty 19.2

## 2018-10-24 ENCOUNTER — Telehealth: Payer: Self-pay | Admitting: Oncology

## 2018-10-27 ENCOUNTER — Encounter: Payer: Self-pay | Admitting: Oncology

## 2018-10-27 ENCOUNTER — Inpatient Hospital Stay (HOSPITAL_BASED_OUTPATIENT_CLINIC_OR_DEPARTMENT_OTHER): Payer: Medicare Other | Admitting: Oncology

## 2018-10-27 ENCOUNTER — Other Ambulatory Visit: Payer: Self-pay

## 2018-10-27 DIAGNOSIS — C3491 Malignant neoplasm of unspecified part of right bronchus or lung: Secondary | ICD-10-CM

## 2018-10-27 DIAGNOSIS — E876 Hypokalemia: Secondary | ICD-10-CM | POA: Diagnosis not present

## 2018-10-27 DIAGNOSIS — Z95828 Presence of other vascular implants and grafts: Secondary | ICD-10-CM

## 2018-10-27 DIAGNOSIS — Z5112 Encounter for antineoplastic immunotherapy: Secondary | ICD-10-CM

## 2018-10-27 NOTE — Progress Notes (Signed)
HEMATOLOGY-ONCOLOGY TeleHEALTH VISIT PROGRESS NOTE  I connected with Mark Mccarty on 10/27/18 at  9:15 AM EDT by telephone visit and verified that I am speaking with the correct person using two identifiers. I discussed the limitations, risks, security and privacy concerns of performing an evaluation and management service by telemedicine and the availability of in-person appointments. I also discussed with the patient that there may be a patient responsible charge related to this service. The patient expressed understanding and agreed to proceed.   Other persons participating in the visit and their role in the encounter:  Mark Mccarty, [place], check in patient   Mark Merl, RN, check in patient.   Patient's location: Home  Provider's location: home Chief Complaint: Follow-up for evaluation prior to durvalumab maintenance treatments for stage III lung cancer.   INTERVAL HISTORY Mark Mccarty is a 68 y.o. male who has above history reviewed by me today presents for follow up visit for management of evaluation prior to durvalumab maintenance treatment for stage III lung cancer. Problems and complaints are listed below:  Patient has been on maintenance durvalumab and so far he reports not experiencing any significant side effects.  Denies any skin rash, diarrhea, abdominal pain.  He reports breathing well.  Chronic shortness of breath with exertion and sometimes cough at baseline.  He reports today that cough is almost gone. 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  He takes potassium chloride 40 mEq daily.  Review of Systems  Constitutional: Positive for fatigue. Negative for appetite change, chills, fever and unexpected weight change.  [name]:   Negative for hearing loss and voice change.   Eyes: Negative for eye problems and icterus.  Respiratory: Negative for chest tightness, cough and shortness of breath.   Cardiovascular: Negative for chest pain and  leg swelling.  Gastrointestinal: Negative for abdominal distention and abdominal pain.  Endocrine: Negative for hot flashes.  Genitourinary: Negative for difficulty urinating, dysuria and frequency.   Musculoskeletal: Negative for arthralgias.  Skin: Negative for itching and rash.  Neurological: Negative for light-headedness and numbness.  [name]: Negative for adenopathy. Does not bruise/bleed easily.  Psychiatric/Behavioral: Negative for confusion.    Past Medical History:  Diagnosis Date  . Asthma   . Cancer ([place])    lung  . Cataract    left cataract surgery  . Dyspnea   . [name]   . GERD (gastroesophageal reflux disease)   . Hypertension   . Neuromuscular disorder ([place])    pt has knees bilateral with tendon issues that cause pain   Past Surgical History:  Procedure Laterality Date  . CARDIAC CATHETERIZATION N/A 12/16/2014   Procedure: Left Heart Cath;  Surgeon: [name], MD;  Location: [facility];  Service: Cardiovascular;  Laterality: N/A;  . CYST REMOVAL TRUNK     chest and back over time and it was removed  . ENDOBRONCHIAL ULTRASOUND N/A 12/09/2017   Procedure: ENDOBRONCHIAL ULTRASOUND;  Surgeon: [name], MD;  Location: [name];  Service: Pulmonary;  Laterality: N/A;  . left cataract surgery    . PORTA CATH INSERTION N/A 12/23/2017   Procedure: PORTA CATH INSERTION;  Surgeon: [name], MD;  Location: [facility];  Service: Cardiovascular;  Laterality: N/A;    Family History  Problem Relation Age of Onset  . Hypertension Mother   . Breast cancer Mother   . Heart attack Mother   . Lung cancer Father   . Heart disease Sister   .  Diabetes Sister   . Colon cancer Sister     Social History   Socioeconomic History  . Marital status: Divorced    Spouse name: Not on file  . Number of children: Not on file  . Years of education: Not on file  . Highest education level: Not on file  Occupational History  . Not  on file  Social Needs  . Financial resource strain: Not on file  . Food insecurity:    Worry: Not on file    Inability: Not on file  . Transportation needs:    Medical: Not on file    Non-medical: Not on file  Tobacco Use  . Smoking status: Former Smoker    Packs/day: 1.50    Years: 35.00    Pack years: 52.50    Types: Cigarettes    Last attempt to quit: 06/22/2015    Years since quitting: 3.3  . Smokeless tobacco: Never Used  Substance and Sexual Activity  . Alcohol use: Yes    Comment: a fifth in a week  . Drug use: No  . Sexual activity: Not Currently  Lifestyle  . Physical activity:    Days per week: Not on file    Minutes per session: Not on file  . Stress: Not on file  Relationships  . Social connections:    Talks on phone: Not on file    Gets together: Not on file    Attends religious service: Not on file    Active member of club or organization: Not on file    Attends meetings of clubs or organizations: Not on file    Relationship status: Not on file  . Intimate partner violence:    Fear of current or ex partner: Not on file    Emotionally abused: Not on file    Physically abused: Not on file    Forced sexual activity: Not on file  Other Topics Concern  . Not on file  Social History Narrative  . Not on file    Current Outpatient Medications on File Prior to Visit  Medication Sig Dispense Refill  . acetaminophen (TYLENOL) 500 MG tablet Take 500 mg by mouth as needed.    [name] albuterol (PROVENTIL HFA;VENTOLIN HFA) 108 (90 Base) MCG/ACT inhaler Inhale 2 puffs into the lungs every 6 (six) hours as needed.  12  . amLODipine (NORVASC) 10 MG tablet Take 10 mg by mouth daily.    Jearl Klinefelter ELLIPTA 62.5-25 MCG/INH AEPB TAKE 1 PUFF BY MOUTH EVERY DAY 180 each 3  . aspirin 81 MG tablet Take 81 mg by mouth daily.    . carvedilol (COREG) 25 MG tablet Take 25 mg by mouth 2 (two) times daily with a meal.    . fluticasone (FLONASE) 50 MCG/ACT nasal spray PLACE 1 SPRAY INTO  BOTH NOSTRILS 2 (TWO) TIMES DAILY  0  . gabapentin (NEURONTIN) 300 MG capsule Take 300 mg by mouth at bedtime as needed.     [name] ibuprofen (ADVIL,MOTRIN) 600 MG tablet Take 600 mg by mouth every 6 (six) hours as needed.    . lidocaine-prilocaine (EMLA) cream Apply to port area once 30 g 3  . lisinopril (PRINIVIL,ZESTRIL) 40 MG tablet Take 40 mg by mouth daily.    . potassium chloride SA (KLOR-CON M20) 20 MEQ tablet Take 2 tablets (40 mEq total) by mouth daily. 60 tablet 2  . pravastatin (PRAVACHOL) 20 MG tablet Take 20 mg by mouth at bedtime.    . prochlorperazine (COMPAZINE)  10 MG tablet Take 1 tablet (10 mg total) by mouth every 6 (six) hours as needed (Nausea or vomiting). 30 tablet 1  . senna (SENOKOT) 8.6 MG TABS tablet Take 2 tablets (17.2 mg total) by mouth daily. 120 each 0  . tamsulosin (FLOMAX) 0.4 MG CAPS capsule Take 0.4 mg by mouth daily.    . Vitamin D, Ergocalciferol, (DRISDOL) 1.25 MG (50000 UT) CAPS capsule Take by mouth.     Current Facility-Administered Medications on File Prior to Visit  Medication Dose Route Frequency Provider Last Rate Last Dose  . sodium chloride flush (NS) 0.9 % injection 10 mL  10 mL Intravenous PRN [name], MD   10 mL at 03/18/18 0858    Allergies  Allergen Reactions  . Penicillins Rash    Stomach hurt Has patient had a PCN reaction causing immediate rash, facial/tongue/throat swelling, SOB or lightheadedness with hypotension: yes Has patient had a PCN reaction causing severe rash involving mucus membranes or skin necrosis: no Has patient had a PCN reaction that required hospitalization: no Has patient had a PCN reaction occurring within the last 10 years: yes If all of the above answers are "NO", then may proceed with Cephalosporin use.        Observations/Objective: Today's Vitals   10/27/18 0857  PainSc: 0-No pain   There is no height or weight on file to calculate BMI.  Physical Exam  CBC    Component Value Date/Time   WBC 5.7  10/14/2018 0850   RBC 4.55 10/14/2018 0850   HGB 13.6 10/14/2018 0850   HGB 15.8 07/10/2014 1328   HCT 38.8 (L) 10/14/2018 0850   HCT 47.2 07/10/2014 1328   PLT 266 10/14/2018 0850   PLT 236 07/10/2014 1328   MCV 85.3 10/14/2018 0850   MCV 93 07/10/2014 1328   MCH 29.9 10/14/2018 0850   MCHC 35.1 10/14/2018 0850   RDW 14.0 10/14/2018 0850   RDW 13.9 07/10/2014 1328   LYMPHSABS 0.7 10/14/2018 0850   LYMPHSABS 2.0 07/10/2014 1328   MONOABS 0.8 10/14/2018 0850   MONOABS 0.8 07/10/2014 1328   EOSABS 0.1 10/14/2018 0850   EOSABS 0.1 07/10/2014 1328   BASOSABS 0.0 10/14/2018 0850   BASOSABS 0.1 07/10/2014 1328    CMP     Component Value Date/Time   NA 136 10/14/2018 0850   NA 138 07/10/2014 1328   K 3.4 (L) 10/14/2018 0850   K 3.1 (L) 07/10/2014 1328   CL 102 10/14/2018 0850   CL 101 07/10/2014 1328   CO2 26 10/14/2018 0850   CO2 31 07/10/2014 1328   GLUCOSE 124 (H) 10/14/2018 0850   GLUCOSE 164 (H) 07/10/2014 1328   BUN 14 10/14/2018 0850   BUN 14 07/10/2014 1328   CREATININE 0.94 10/14/2018 0850   CREATININE 1.16 07/10/2014 1328   CALCIUM 9.3 10/14/2018 0850   CALCIUM 8.9 07/10/2014 1328   PROT 7.5 10/14/2018 0850   PROT 7.4 07/10/2014 1328   ALBUMIN 4.1 10/14/2018 0850   ALBUMIN 3.4 07/10/2014 1328   AST 22 10/14/2018 0850   AST 22 07/10/2014 1328   ALT 16 10/14/2018 0850   ALT 19 07/10/2014 1328   ALKPHOS 70 10/14/2018 0850   ALKPHOS 60 07/10/2014 1328   BILITOT 0.5 10/14/2018 0850   BILITOT 0.5 07/10/2014 1328   GFRNONAA >60 10/14/2018 0850   GFRNONAA >60 07/10/2014 1328   GFRAA >60 10/14/2018 0850   GFRAA >60 07/10/2014 1328     Assessment and Plan: 1. Primary  lung adenocarcinoma, right ([place])   2. Hypokalemia   3. Port-A-Cath in place   4. Encounter for antineoplastic immunotherapy     Stage III lung adenocarcinoma, tolerates immunotherapy with durvalumab well.  He has had 15 cycles of durvalumab so far.  Reports no immunotherapy related toxicities.    He will proceed with labs on 10/28/2018 and if labs meeting treatment criteria and vital signs stable meeting treatment criteria, he will proceed with durvalumab treatment on 10/28/2018  #Hypokalemia, continue potassium supplementation. Follow Up Instructions: Patient will see me on 11/07/2018 for labs and MD assessment and plan immunotherapy durvalumab on 11/11/2018.   I discussed the assessment and treatment plan with the patient. The patient was provided an opportunity to ask questions and all were answered. The patient agreed with the plan and demonstrated an understanding of the instructions.  The patient was advised to call back or seek an in-person evaluation if the symptoms worsen or if the condition fails to improve as anticipated.   I provided 15 minutes of non face-to-face telephone visit time during this encounter, and > 50% was spent counseling as documented under my assessment & plan.  [name], MD 10/27/2018 1:01 PM

## 2018-10-27 NOTE — Progress Notes (Signed)
Called patient for TeleVisit.  Patient states no new concerns today

## 2018-10-28 ENCOUNTER — Inpatient Hospital Stay: Payer: Medicare Other

## 2018-10-28 ENCOUNTER — Other Ambulatory Visit: Payer: Self-pay

## 2018-10-28 VITALS — BP 146/80 | HR 77 | Temp 97.2°F | Resp 20 | Wt 219.0 lb

## 2018-10-28 DIAGNOSIS — Z5112 Encounter for antineoplastic immunotherapy: Secondary | ICD-10-CM | POA: Diagnosis not present

## 2018-10-28 DIAGNOSIS — C3491 Malignant neoplasm of unspecified part of right bronchus or lung: Secondary | ICD-10-CM

## 2018-10-28 DIAGNOSIS — Z95828 Presence of other vascular implants and grafts: Secondary | ICD-10-CM

## 2018-10-28 LAB — CBC WITH DIFFERENTIAL/PLATELET
Abs Immature Granulocytes: 0.03 10*3/uL (ref 0.00–0.07)
Basophils Absolute: 0 10*3/uL (ref 0.0–0.1)
Basophils Relative: 0 %
Eosinophils Absolute: 0.1 10*3/uL (ref 0.0–0.5)
Eosinophils Relative: 2 %
HCT: 41.8 % (ref 39.0–52.0)
Hemoglobin: 14.4 g/dL (ref 13.0–17.0)
Immature Granulocytes: 1 %
Lymphocytes Relative: 12 %
Lymphs Abs: 0.7 10*3/uL (ref 0.7–4.0)
MCH: 29.6 pg (ref 26.0–34.0)
MCHC: 34.4 g/dL (ref 30.0–36.0)
MCV: 85.8 fL (ref 80.0–100.0)
Monocytes Absolute: 0.8 10*3/uL (ref 0.1–1.0)
Monocytes Relative: 14 %
Neutro Abs: 4.1 10*3/uL (ref 1.7–7.7)
Neutrophils Relative %: 71 %
Platelets: 237 10*3/uL (ref 150–400)
RBC: 4.87 MIL/uL (ref 4.22–5.81)
RDW: 13.8 % (ref 11.5–15.5)
WBC: 5.7 10*3/uL (ref 4.0–10.5)
nRBC: 0.3 % — ABNORMAL HIGH (ref 0.0–0.2)

## 2018-10-28 LAB — COMPREHENSIVE METABOLIC PANEL
ALT: 19 U/L (ref 0–44)
AST: 25 U/L (ref 15–41)
Albumin: 4 g/dL (ref 3.5–5.0)
Alkaline Phosphatase: 63 U/L (ref 38–126)
Anion gap: 7 (ref 5–15)
BUN: 15 mg/dL (ref 8–23)
CO2: 28 mmol/L (ref 22–32)
Calcium: 9.5 mg/dL (ref 8.9–10.3)
Chloride: 103 mmol/L (ref 98–111)
Creatinine, Ser: 0.9 mg/dL (ref 0.61–1.24)
GFR calc Af Amer: >60 mL/min (ref >60)
GFR calc non Af Amer: >60 mL/min (ref >60)
Glucose, Bld: 114 mg/dL — ABNORMAL HIGH (ref 70–99)
Potassium: 3.9 mmol/L (ref 3.5–5.1)
Sodium: 138 mmol/L (ref 135–145)
Total Bilirubin: 0.5 mg/dL (ref 0.3–1.2)
Total Protein: 7.5 g/dL (ref 6.5–8.1)

## 2018-10-28 MED ORDER — HEPARIN SOD (PORK) LOCK FLUSH 100 UNIT/ML IV SOLN
500.0000 [IU] | Freq: Once | INTRAVENOUS | Status: AC | PRN
  Administered 2018-10-28: 12:00:00 500 [IU]
  Filled 2018-10-28: qty 5

## 2018-10-28 MED ORDER — SODIUM CHLORIDE 0.9 % IV SOLN
10.0000 mg/kg | Freq: Once | INTRAVENOUS | Status: AC
  Administered 2018-10-28: 11:00:00 960 mg via INTRAVENOUS
  Filled 2018-10-28: qty 10

## 2018-10-28 MED ORDER — SODIUM CHLORIDE 0.9 % IV SOLN
Freq: Once | INTRAVENOUS | Status: AC
  Administered 2018-10-28: 10:00:00 via INTRAVENOUS
  Filled 2018-10-28: qty 250

## 2018-10-28 MED ORDER — SODIUM CHLORIDE 0.9% FLUSH
10.0000 mL | Freq: Once | INTRAVENOUS | Status: AC
  Administered 2018-10-28: 10 mL via INTRAVENOUS
  Filled 2018-10-28: qty 10

## 2018-11-04 ENCOUNTER — Other Ambulatory Visit: Payer: Self-pay | Admitting: Oncology

## 2018-11-04 NOTE — Telephone Encounter (Signed)
)     Ref Range & Units 7d ago (10/28/18) 3wk ago (10/14/18) 67mo ago (09/30/18) 57mo ago (09/16/18) 88mo ago (09/02/18) 87mo ago (08/19/18) 71mo ago (08/05/18)  Potassium 3.5 - 5.1 mmol/L 3.9  3.4Low   3.9  3.4Low   3.6  3.7  3.2Low

## 2018-11-05 ENCOUNTER — Other Ambulatory Visit: Payer: Self-pay

## 2018-11-06 ENCOUNTER — Inpatient Hospital Stay: Payer: Medicare Other | Attending: Oncology

## 2018-11-06 ENCOUNTER — Other Ambulatory Visit: Payer: Self-pay

## 2018-11-06 DIAGNOSIS — Z79899 Other long term (current) drug therapy: Secondary | ICD-10-CM | POA: Insufficient documentation

## 2018-11-06 DIAGNOSIS — E876 Hypokalemia: Secondary | ICD-10-CM | POA: Insufficient documentation

## 2018-11-06 DIAGNOSIS — C3491 Malignant neoplasm of unspecified part of right bronchus or lung: Secondary | ICD-10-CM | POA: Diagnosis present

## 2018-11-06 DIAGNOSIS — Z5112 Encounter for antineoplastic immunotherapy: Secondary | ICD-10-CM | POA: Insufficient documentation

## 2018-11-06 LAB — COMPREHENSIVE METABOLIC PANEL
ALT: 21 U/L (ref 0–44)
AST: 27 U/L (ref 15–41)
Albumin: 4 g/dL (ref 3.5–5.0)
Alkaline Phosphatase: 50 U/L (ref 38–126)
Anion gap: 8 (ref 5–15)
BUN: 15 mg/dL (ref 8–23)
CO2: 27 mmol/L (ref 22–32)
Calcium: 9.2 mg/dL (ref 8.9–10.3)
Chloride: 104 mmol/L (ref 98–111)
Creatinine, Ser: 0.9 mg/dL (ref 0.61–1.24)
GFR calc Af Amer: >60 mL/min (ref >60)
GFR calc non Af Amer: >60 mL/min (ref >60)
Glucose, Bld: 120 mg/dL — ABNORMAL HIGH (ref 70–99)
Potassium: 3.6 mmol/L (ref 3.5–5.1)
Sodium: 139 mmol/L (ref 135–145)
Total Bilirubin: 0.5 mg/dL (ref 0.3–1.2)
Total Protein: 7.5 g/dL (ref 6.5–8.1)

## 2018-11-06 LAB — CBC WITH DIFFERENTIAL/PLATELET
Abs Immature Granulocytes: 0.02 10*3/uL (ref 0.00–0.07)
Basophils Absolute: 0 10*3/uL (ref 0.0–0.1)
Basophils Relative: 1 %
Eosinophils Absolute: 0.1 10*3/uL (ref 0.0–0.5)
Eosinophils Relative: 2 %
HCT: 40.5 % (ref 39.0–52.0)
Hemoglobin: 13.7 g/dL (ref 13.0–17.0)
Immature Granulocytes: 0 %
Lymphocytes Relative: 13 %
Lymphs Abs: 0.8 10*3/uL (ref 0.7–4.0)
MCH: 29.2 pg (ref 26.0–34.0)
MCHC: 33.8 g/dL (ref 30.0–36.0)
MCV: 86.4 fL (ref 80.0–100.0)
Monocytes Absolute: 0.7 10*3/uL (ref 0.1–1.0)
Monocytes Relative: 11 %
Neutro Abs: 4.4 10*3/uL (ref 1.7–7.7)
Neutrophils Relative %: 73 %
Platelets: 232 10*3/uL (ref 150–400)
RBC: 4.69 MIL/uL (ref 4.22–5.81)
RDW: 14.2 % (ref 11.5–15.5)
WBC: 6 10*3/uL (ref 4.0–10.5)
nRBC: 0 % (ref 0.0–0.2)

## 2018-11-07 ENCOUNTER — Inpatient Hospital Stay: Payer: Medicare Other

## 2018-11-07 ENCOUNTER — Encounter: Payer: Self-pay | Admitting: Oncology

## 2018-11-07 ENCOUNTER — Other Ambulatory Visit: Payer: Self-pay

## 2018-11-07 ENCOUNTER — Inpatient Hospital Stay (HOSPITAL_BASED_OUTPATIENT_CLINIC_OR_DEPARTMENT_OTHER): Payer: Medicare Other | Admitting: Oncology

## 2018-11-07 VITALS — BP 142/78 | HR 77 | Temp 97.7°F | Resp 18 | Wt 220.8 lb

## 2018-11-07 DIAGNOSIS — Z5112 Encounter for antineoplastic immunotherapy: Secondary | ICD-10-CM | POA: Diagnosis not present

## 2018-11-07 DIAGNOSIS — R0609 Other forms of dyspnea: Secondary | ICD-10-CM | POA: Diagnosis not present

## 2018-11-07 DIAGNOSIS — Z87891 Personal history of nicotine dependence: Secondary | ICD-10-CM

## 2018-11-07 DIAGNOSIS — E876 Hypokalemia: Secondary | ICD-10-CM

## 2018-11-07 DIAGNOSIS — C3491 Malignant neoplasm of unspecified part of right bronchus or lung: Secondary | ICD-10-CM

## 2018-11-08 NOTE — Progress Notes (Signed)
Hematology/Oncology Follow up note [facility] Telephone:([phone] Fax:([phone]   Patient Care Team: [name], MD as PCP - General (Internal Medicine) [name], RN as Registered Nurse   REASON FOR VISIT Follow up for immunotherapy treatment for stage III lung adenocarcinoma  Oncology History:  Mark Mccarty is a  68 y.o.  male with  adenocarcinoma of lung, diagnosed via biopsy of right hilar lymph nodes.  Former 43 pack year smoking history.  Presents for assessment prior to concurrent chemo T3 N1 M0  INTERVAL HISTORY Mark Mccarty is a 68 y.o. male who has above history reviewed by me presents for assessment prior to immunotherapy with durvalumab for treatment of stage III non-small cell lung cancer. The patient was scheduled for same person visit which was mistakenly changed to video visit. Patient does not have smart phone or computer to do video visit.  So he presented to the clinic for in person visit. Patient continues to do well.  He has no complaints today. He tolerates immunotherapy without significant clinical signs of immunotherapy related toxicities. Denies any diarrhea, skin rash, abdominal pain, nasal congestion has completely resolved. Breathing well.  No cough. Chronic shortness of breath with exertion at baseline. Patient takes potassium chloride 40 mEq daily.  . Review of Systems  Constitutional: Negative for appetite change, chills, fatigue, fever and unexpected weight change.  [name]:   Negative for hearing loss and voice change.   Eyes: Negative for eye problems and icterus.  Respiratory: Negative for chest tightness, cough and shortness of breath.   Cardiovascular: Negative for chest pain and leg swelling.  Gastrointestinal: Negative for abdominal distention and abdominal pain.  Endocrine: Negative for hot flashes.  Genitourinary: Negative for difficulty urinating, dysuria and frequency.   Musculoskeletal:  Negative for arthralgias.  Skin: Negative for itching and rash.  Neurological: Negative for light-headedness and numbness.  [name]: Negative for adenopathy. Does not bruise/bleed easily.  Psychiatric/Behavioral: Negative for confusion.     MEDICAL HISTORY:  Past Medical History:  Diagnosis Date  . Asthma   . Cancer ([place])    lung  . Cataract    left cataract surgery  . Dyspnea   . [name]   . GERD (gastroesophageal reflux disease)   . Hypertension   . Neuromuscular disorder ([place])    pt has knees bilateral with tendon issues that cause pain    SURGICAL HISTORY: Past Surgical History:  Procedure Laterality Date  . CARDIAC CATHETERIZATION N/A 12/16/2014   Procedure: Left Heart Cath;  Surgeon: [name], MD;  Location: [place] [facility];  Service: Cardiovascular;  Laterality: N/A;  . CYST REMOVAL TRUNK     chest and back over time and it was removed  . ENDOBRONCHIAL ULTRASOUND N/A 12/09/2017   Procedure: ENDOBRONCHIAL ULTRASOUND;  Surgeon: [name], MD;  Location: [name];  Service: Pulmonary;  Laterality: N/A;  . left cataract surgery    . PORTA CATH INSERTION N/A 12/23/2017   Procedure: PORTA CATH INSERTION;  Surgeon: [name], MD;  Location: [place] [facility];  Service: Cardiovascular;  Laterality: N/A;    SOCIAL HISTORY: Social History   Socioeconomic History  . Marital status: Divorced    Spouse name: Not on file  . Number of children: Not on file  . Years of education: Not on file  . Highest education level: Not on file  Occupational History  . Not on file  Social Needs  . Financial resource strain: Not on file  .  Food insecurity:    Worry: Not on file    Inability: Not on file  . Transportation needs:    Medical: Not on file    Non-medical: Not on file  Tobacco Use  . Smoking status: Former Smoker    Packs/day: 1.50    Years: 35.00    Pack years: 52.50    Types: Cigarettes    Last attempt to quit: 06/22/2015     Years since quitting: 3.3  . Smokeless tobacco: Never Used  Substance and Sexual Activity  . Alcohol use: Yes    Comment: a fifth in a week  . Drug use: No  . Sexual activity: Not Currently  Lifestyle  . Physical activity:    Days per week: Not on file    Minutes per session: Not on file  . Stress: Not on file  Relationships  . Social connections:    Talks on phone: Not on file    Gets together: Not on file    Attends religious service: Not on file    Active member of club or organization: Not on file    Attends meetings of clubs or organizations: Not on file    Relationship status: Not on file  . Intimate partner violence:    Fear of current or ex partner: Not on file    Emotionally abused: Not on file    Physically abused: Not on file    Forced sexual activity: Not on file  Other Topics Concern  . Not on file  Social History Narrative  . Not on file    FAMILY HISTORY: Family History  Problem Relation Age of Onset  . Hypertension Mother   . Breast cancer Mother   . Heart attack Mother   . Lung cancer Father   . Heart disease Sister   . Diabetes Sister   . Colon cancer Sister     ALLERGIES:  is allergic to penicillins.  MEDICATIONS:  Current Outpatient Medications  Medication Sig Dispense Refill  . acetaminophen (TYLENOL) 500 MG tablet Take 500 mg by mouth as needed.    [name] albuterol (PROVENTIL HFA;VENTOLIN HFA) 108 (90 Base) MCG/ACT inhaler Inhale 2 puffs into the lungs every 6 (six) hours as needed.  12  . amLODipine (NORVASC) 10 MG tablet Take 10 mg by mouth daily.    Jearl Klinefelter ELLIPTA 62.5-25 MCG/INH AEPB TAKE 1 PUFF BY MOUTH EVERY DAY 180 each 3  . aspirin 81 MG tablet Take 81 mg by mouth daily.    . carvedilol (COREG) 25 MG tablet Take 25 mg by mouth 2 (two) times daily with a meal.    . gabapentin (NEURONTIN) 300 MG capsule Take 300 mg by mouth at bedtime as needed.     [name] KLOR-CON M20 20 MEQ tablet TAKE 2 TABLETS BY MOUTH DAILY 60 tablet 2  .  lidocaine-prilocaine (EMLA) cream Apply to port area once 30 g 3  . lisinopril (PRINIVIL,ZESTRIL) 40 MG tablet Take 40 mg by mouth daily.    . pravastatin (PRAVACHOL) 20 MG tablet Take 20 mg by mouth at bedtime.    . senna (SENOKOT) 8.6 MG TABS tablet Take 2 tablets (17.2 mg total) by mouth daily. 120 each 0  . tamsulosin (FLOMAX) 0.4 MG CAPS capsule Take 0.4 mg by mouth daily.    . Vitamin D, Ergocalciferol, (DRISDOL) 1.25 MG (50000 UT) CAPS capsule Take 50,000 Units by mouth every 7 (seven) days.     . fluticasone (FLONASE) 50 MCG/ACT nasal spray PLACE  1 SPRAY INTO BOTH NOSTRILS 2 (TWO) TIMES DAILY  0  . ibuprofen (ADVIL,MOTRIN) 600 MG tablet Take 600 mg by mouth every 6 (six) hours as needed.    . prochlorperazine (COMPAZINE) 10 MG tablet Take 1 tablet (10 mg total) by mouth every 6 (six) hours as needed (Nausea or vomiting). (Patient not taking: Reported on 11/07/2018) 30 tablet 1   No current facility-administered medications for this visit.    Facility-Administered Medications Ordered in Other Visits  Medication Dose Route Frequency Provider Last Rate Last Dose  . sodium chloride flush (NS) 0.9 % injection 10 mL  10 mL Intravenous PRN [name], MD   10 mL at 03/18/18 0858     PHYSICAL EXAMINATION: ECOG PERFORMANCE STATUS: 0 - Asymptomatic Vitals:   11/07/18 0922  BP: (!) 142/78  Pulse: 77  Resp: 18  Temp: 97.7 F (36.5 C)   Filed Weights   11/07/18 0922  Weight: 220 lb 12.8 oz (100.2 kg)    Physical Exam Constitutional:      General: He is not in acute distress. [name]:     Head: Normocephalic and atraumatic.  Eyes:     General: No scleral icterus.    Conjunctiva/sclera: Conjunctivae normal.     Pupils: Pupils are equal, round, and reactive to light.  Neck:     Musculoskeletal: Normal range of motion and neck supple.  Cardiovascular:     Rate and Rhythm: Normal rate and regular rhythm.     Heart sounds: Normal heart sounds.  Pulmonary:     Effort: Pulmonary effort is  normal. No respiratory distress.     Breath sounds: No wheezing or rales.     Comments: Decreased breath sound bilaterally Chest:     Chest wall: No tenderness.  Abdominal:     General: Bowel sounds are normal. There is no distension.     Palpations: Abdomen is soft. There is no mass.     Tenderness: There is no abdominal tenderness.  Musculoskeletal: Normal range of motion.        General: No deformity.  Lymphadenopathy:     Cervical: No cervical adenopathy.  Skin:    General: Skin is warm and dry.     Findings: No erythema or rash.  Neurological:     Mental Status: He is alert and oriented to person, place, and time.     Cranial Nerves: No cranial nerve deficit.     Coordination: Coordination normal.  Psychiatric:        Behavior: Behavior normal.        Thought Content: Thought content normal.      LABORATORY DATA:  I have reviewed the data as listed Lab Results  Component Value Date   WBC 6.0 11/06/2018   HGB 13.7 11/06/2018   HCT 40.5 11/06/2018   MCV 86.4 11/06/2018   PLT 232 11/06/2018   Recent Labs    10/14/18 0850 10/28/18 0939 11/06/18 1411  NA 136 138 139  K 3.4* 3.9 3.6  CL 102 103 104  CO2 26 28 27   GLUCOSE 124* 114* 120*  BUN 14 15 15   CREATININE 0.94 0.90 0.90  CALCIUM 9.3 9.5 9.2  GFRNONAA >60 >60 >60  GFRAA >60 >60 >60  PROT 7.5 7.5 7.5  ALBUMIN 4.1 4.0 4.0  AST 22 25 27   ALT 16 19 21   ALKPHOS 70 63 50  BILITOT 0.5 0.5 0.5   RADIOGRAPHIC STUDIES: I have personally reviewed the radiological images as listed and agreed with  the findings in the report.  03/21/2018 CT chest showed mild decreasing size of the right upper lobe pulmonary nodule.  Interval decrease in size of right paratracheal and right hilar adenopathy.  06/30/2018 CT chest showed interval development of extensive changes secondary to external beam radiation within the posterior right upper lobe and superior segment of right lower lobe.  The enlarged right paratracheal lymph  node is decreased in size in the interval.  Right upper lobe lung lesion is difficult to separate from surrounding changes due to radiation but is favored to have decrease in size in the interval.  Aortic atherosclerosis and emphysema.  LAD coronary artery atherosclerotic calcification.  08/06/2018 CT head without contrast Brain parenchymal appears unremarkable there is no mass or hemorrhage.   ASSESSMENT & PLAN:  1. Primary lung adenocarcinoma, right ([place])   2. Encounter for antineoplastic immunotherapy   3. Hypokalemia   T3 N1 M0 lung adenocarcinoma, s/p concurrent chemoradiation.   #Stage III lung adenocarcinoma, He tolerates immunotherapy well.  No significant immunotherapy related toxicities. Reviewed and discussed with patient. Counts today are acceptable. Patient is going to proceed with cycle 17 durvalumab on 11/11/2018. Plan total of 26 cycles. TSH has been monitored and has been stable. Patient had CT done on 10/08/2018.  He is due for another surveillance CT in July 2020.  #Hypokalemia due to electric wasting.  Stable potassium.  Continue potassium chloride 40 mEq daily.    Return visit, 2 weeks for evaluation prior to next durvalumab treatment.    [name], MD, PhD Hematology Oncology [facility] at [facility] Pager- [phone] 11/07/2018

## 2018-11-10 ENCOUNTER — Other Ambulatory Visit: Payer: Self-pay

## 2018-11-11 ENCOUNTER — Other Ambulatory Visit: Payer: Self-pay

## 2018-11-11 ENCOUNTER — Inpatient Hospital Stay: Payer: Medicare Other

## 2018-11-11 VITALS — BP 136/80 | HR 71 | Temp 96.7°F | Resp 18 | Wt 219.4 lb

## 2018-11-11 DIAGNOSIS — Z5112 Encounter for antineoplastic immunotherapy: Secondary | ICD-10-CM | POA: Diagnosis not present

## 2018-11-11 DIAGNOSIS — C3491 Malignant neoplasm of unspecified part of right bronchus or lung: Secondary | ICD-10-CM

## 2018-11-11 MED ORDER — SODIUM CHLORIDE 0.9% FLUSH
10.0000 mL | INTRAVENOUS | Status: DC | PRN
  Administered 2018-11-11: 10 mL
  Filled 2018-11-11: qty 10

## 2018-11-11 MED ORDER — HEPARIN SOD (PORK) LOCK FLUSH 100 UNIT/ML IV SOLN
500.0000 [IU] | Freq: Once | INTRAVENOUS | Status: AC | PRN
  Administered 2018-11-11: 500 [IU]
  Filled 2018-11-11: qty 5

## 2018-11-11 MED ORDER — SODIUM CHLORIDE 0.9 % IV SOLN
10.0000 mg/kg | Freq: Once | INTRAVENOUS | Status: AC
  Administered 2018-11-11: 960 mg via INTRAVENOUS
  Filled 2018-11-11: qty 19.2

## 2018-11-11 MED ORDER — SODIUM CHLORIDE 0.9 % IV SOLN
Freq: Once | INTRAVENOUS | Status: AC
  Administered 2018-11-11: 10:00:00 via INTRAVENOUS
  Filled 2018-11-11: qty 250

## 2018-11-24 ENCOUNTER — Other Ambulatory Visit: Payer: Self-pay

## 2018-11-25 ENCOUNTER — Inpatient Hospital Stay: Payer: Medicare Other

## 2018-11-25 ENCOUNTER — Inpatient Hospital Stay (HOSPITAL_BASED_OUTPATIENT_CLINIC_OR_DEPARTMENT_OTHER): Payer: Medicare Other | Admitting: Oncology

## 2018-11-25 ENCOUNTER — Encounter: Payer: Self-pay | Admitting: Oncology

## 2018-11-25 ENCOUNTER — Inpatient Hospital Stay: Payer: Medicare Other | Admitting: *Deleted

## 2018-11-25 ENCOUNTER — Other Ambulatory Visit: Payer: Self-pay

## 2018-11-25 VITALS — BP 144/84 | HR 69 | Temp 97.2°F | Resp 18 | Wt 221.3 lb

## 2018-11-25 DIAGNOSIS — R0609 Other forms of dyspnea: Secondary | ICD-10-CM

## 2018-11-25 DIAGNOSIS — Z5112 Encounter for antineoplastic immunotherapy: Secondary | ICD-10-CM | POA: Diagnosis not present

## 2018-11-25 DIAGNOSIS — Z95828 Presence of other vascular implants and grafts: Secondary | ICD-10-CM

## 2018-11-25 DIAGNOSIS — E876 Hypokalemia: Secondary | ICD-10-CM

## 2018-11-25 DIAGNOSIS — C3491 Malignant neoplasm of unspecified part of right bronchus or lung: Secondary | ICD-10-CM

## 2018-11-25 DIAGNOSIS — Z87891 Personal history of nicotine dependence: Secondary | ICD-10-CM | POA: Diagnosis not present

## 2018-11-25 LAB — COMPREHENSIVE METABOLIC PANEL
ALT: 22 U/L (ref 0–44)
AST: 26 U/L (ref 15–41)
Albumin: 4.1 g/dL (ref 3.5–5.0)
Alkaline Phosphatase: 59 U/L (ref 38–126)
Anion gap: 9 (ref 5–15)
BUN: 11 mg/dL (ref 8–23)
CO2: 27 mmol/L (ref 22–32)
Calcium: 9.1 mg/dL (ref 8.9–10.3)
Chloride: 102 mmol/L (ref 98–111)
Creatinine, Ser: 0.79 mg/dL (ref 0.61–1.24)
GFR calc Af Amer: >60 mL/min (ref >60)
GFR calc non Af Amer: >60 mL/min (ref >60)
Glucose, Bld: 108 mg/dL — ABNORMAL HIGH (ref 70–99)
Potassium: 3.7 mmol/L (ref 3.5–5.1)
Sodium: 138 mmol/L (ref 135–145)
Total Bilirubin: 0.8 mg/dL (ref 0.3–1.2)
Total Protein: 7.5 g/dL (ref 6.5–8.1)

## 2018-11-25 LAB — CBC WITH DIFFERENTIAL/PLATELET
Abs Immature Granulocytes: 0.02 10*3/uL (ref 0.00–0.07)
Basophils Absolute: 0 10*3/uL (ref 0.0–0.1)
Basophils Relative: 1 %
Eosinophils Absolute: 0.1 10*3/uL (ref 0.0–0.5)
Eosinophils Relative: 2 %
HCT: 38.8 % — ABNORMAL LOW (ref 39.0–52.0)
Hemoglobin: 13.5 g/dL (ref 13.0–17.0)
Immature Granulocytes: 0 %
Lymphocytes Relative: 12 %
Lymphs Abs: 0.7 10*3/uL (ref 0.7–4.0)
MCH: 29.3 pg (ref 26.0–34.0)
MCHC: 34.8 g/dL (ref 30.0–36.0)
MCV: 84.2 fL (ref 80.0–100.0)
Monocytes Absolute: 1 10*3/uL (ref 0.1–1.0)
Monocytes Relative: 15 %
Neutro Abs: 4.5 10*3/uL (ref 1.7–7.7)
Neutrophils Relative %: 70 %
Platelets: 237 10*3/uL (ref 150–400)
RBC: 4.61 MIL/uL (ref 4.22–5.81)
RDW: 13.7 % (ref 11.5–15.5)
WBC: 6.3 10*3/uL (ref 4.0–10.5)
nRBC: 0 % (ref 0.0–0.2)

## 2018-11-25 LAB — TSH: TSH: 3.516 u[IU]/mL (ref 0.350–4.500)

## 2018-11-25 MED ORDER — SODIUM CHLORIDE 0.9% FLUSH
10.0000 mL | Freq: Once | INTRAVENOUS | Status: AC
  Administered 2018-11-25: 09:00:00 10 mL via INTRAVENOUS
  Filled 2018-11-25: qty 10

## 2018-11-25 MED ORDER — SODIUM CHLORIDE 0.9 % IV SOLN
Freq: Once | INTRAVENOUS | Status: AC
  Administered 2018-11-25: 10:00:00 via INTRAVENOUS
  Filled 2018-11-25: qty 250

## 2018-11-25 MED ORDER — HEPARIN SOD (PORK) LOCK FLUSH 100 UNIT/ML IV SOLN
500.0000 [IU] | Freq: Once | INTRAVENOUS | Status: AC | PRN
  Administered 2018-11-25: 500 [IU]
  Filled 2018-11-25: qty 5

## 2018-11-25 MED ORDER — SODIUM CHLORIDE 0.9 % IV SOLN
10.0000 mg/kg | Freq: Once | INTRAVENOUS | Status: AC
  Administered 2018-11-25: 10:00:00 960 mg via INTRAVENOUS
  Filled 2018-11-25: qty 10

## 2018-11-25 NOTE — Progress Notes (Signed)
Hematology/Oncology Follow up note [facility] Telephone:(336) [phone] Fax:(336) [phone]   Patient Care Team: [name], MD as PCP - General (Internal Medicine) [name], RN as Registered Nurse   REASON FOR VISIT Follow up for immunotherapy treatment for stage IIIA lung adenocarcinoma  Oncology History:  Mark Mccarty is a  68 y.o.  male with  adenocarcinoma of lung, diagnosed via biopsy of right hilar lymph nodes.  Former 43 pack year smoking history.  Presents for assessment prior to concurrent chemo T3 N1 M0  INTERVAL HISTORY Mark Mccarty is a 68 y.o. male who has above history reviewed by me presents for evaluation prior to immunotherapy with durvalumab for treatment of stage III A non-small cell lung cancer. Patient reports feeling well at baseline. Denies any cough, diarrhea, skin rash. Chronic shortness of breath at baseline. Patient is taking potassium chloride 40 mEq daily.  He has no new complaints today. . Review of Systems  Constitutional: Negative for appetite change, chills, fatigue, fever and unexpected weight change.  [name]:   Negative for hearing loss and voice change.   Eyes: Negative for eye problems and icterus.  Respiratory: Negative for chest tightness, cough and shortness of breath.   Cardiovascular: Negative for chest pain and leg swelling.  Gastrointestinal: Negative for abdominal distention and abdominal pain.  Endocrine: Negative for hot flashes.  Genitourinary: Negative for difficulty urinating, dysuria and frequency.   Musculoskeletal: Negative for arthralgias.  Skin: Negative for itching and rash.  Neurological: Negative for light-headedness and numbness.  [name]: Negative for adenopathy. Does not bruise/bleed easily.  Psychiatric/Behavioral: Negative for confusion.     MEDICAL HISTORY:  Past Medical History:  Diagnosis Date  . Asthma   . Cancer ([place])    lung  . Cataract    left cataract  surgery  . Dyspnea   . [name]   . GERD (gastroesophageal reflux disease)   . Hypertension   . Neuromuscular disorder ([place])    pt has knees bilateral with tendon issues that cause pain    SURGICAL HISTORY: Past Surgical History:  Procedure Laterality Date  . CARDIAC CATHETERIZATION N/A 12/16/2014   Procedure: Left Heart Cath;  Surgeon: [name], MD;  Location: [place] [facility];  Service: Cardiovascular;  Laterality: N/A;  . CYST REMOVAL TRUNK     chest and back over time and it was removed  . ENDOBRONCHIAL ULTRASOUND N/A 12/09/2017   Procedure: ENDOBRONCHIAL ULTRASOUND;  Surgeon: [name], MD;  Location: [name];  Service: Pulmonary;  Laterality: N/A;  . left cataract surgery    . PORTA CATH INSERTION N/A 12/23/2017   Procedure: PORTA CATH INSERTION;  Surgeon: [name], MD;  Location: [place] [facility];  Service: Cardiovascular;  Laterality: N/A;    SOCIAL HISTORY: Social History   Socioeconomic History  . Marital status: Divorced    Spouse name: Not on file  . Number of children: Not on file  . Years of education: Not on file  . Highest education level: Not on file  Occupational History  . Not on file  Social Needs  . Financial resource strain: Not on file  . Food insecurity:    Worry: Not on file    Inability: Not on file  . Transportation needs:    Medical: Not on file    Non-medical: Not on file  Tobacco Use  . Smoking status: Former Smoker    Packs/day: 1.50    Years: 35.00    Pack years: 52.50  Types: Cigarettes    Last attempt to quit: 06/22/2015    Years since quitting: 3.4  . Smokeless tobacco: Never Used  Substance and Sexual Activity  . Alcohol use: Yes    Comment: a fifth in a week  . Drug use: No  . Sexual activity: Not Currently  Lifestyle  . Physical activity:    Days per week: Not on file    Minutes per session: Not on file  . Stress: Not on file  Relationships  . Social connections:    Talks on  phone: Not on file    Gets together: Not on file    Attends religious service: Not on file    Active member of club or organization: Not on file    Attends meetings of clubs or organizations: Not on file    Relationship status: Not on file  . Intimate partner violence:    Fear of current or ex partner: Not on file    Emotionally abused: Not on file    Physically abused: Not on file    Forced sexual activity: Not on file  Other Topics Concern  . Not on file  Social History Narrative  . Not on file    FAMILY HISTORY: Family History  Problem Relation Age of Onset  . Hypertension Mother   . Breast cancer Mother   . Heart attack Mother   . Lung cancer Father   . Heart disease Sister   . Diabetes Sister   . Colon cancer Sister     ALLERGIES:  is allergic to penicillins.  MEDICATIONS:  Current Outpatient Medications  Medication Sig Dispense Refill  . acetaminophen (TYLENOL) 500 MG tablet Take 500 mg by mouth as needed.    [name] albuterol (PROVENTIL HFA;VENTOLIN HFA) 108 (90 Base) MCG/ACT inhaler Inhale 2 puffs into the lungs every 6 (six) hours as needed.  12  . amLODipine (NORVASC) 10 MG tablet Take 10 mg by mouth daily.    Jearl Klinefelter ELLIPTA 62.5-25 MCG/INH AEPB TAKE 1 PUFF BY MOUTH EVERY DAY 180 each 3  . aspirin 81 MG tablet Take 81 mg by mouth daily.    . carvedilol (COREG) 25 MG tablet Take 25 mg by mouth 2 (two) times daily with a meal.    . fluticasone (FLONASE) 50 MCG/ACT nasal spray PLACE 1 SPRAY INTO BOTH NOSTRILS 2 (TWO) TIMES DAILY  0  . gabapentin (NEURONTIN) 300 MG capsule Take 300 mg by mouth at bedtime as needed.     [name] ibuprofen (ADVIL,MOTRIN) 600 MG tablet Take 600 mg by mouth every 6 (six) hours as needed.    [name] KLOR-CON M20 20 MEQ tablet TAKE 2 TABLETS BY MOUTH DAILY 60 tablet 2  . lidocaine-prilocaine (EMLA) cream Apply to port area once 30 g 3  . lisinopril (PRINIVIL,ZESTRIL) 40 MG tablet Take 40 mg by mouth daily.    . pravastatin (PRAVACHOL) 20 MG tablet Take 20  mg by mouth at bedtime.    . prochlorperazine (COMPAZINE) 10 MG tablet Take 1 tablet (10 mg total) by mouth every 6 (six) hours as needed (Nausea or vomiting). 30 tablet 1  . senna (SENOKOT) 8.6 MG TABS tablet Take 2 tablets (17.2 mg total) by mouth daily. 120 each 0  . tamsulosin (FLOMAX) 0.4 MG CAPS capsule Take 0.4 mg by mouth daily.    . Vitamin D, Ergocalciferol, (DRISDOL) 1.25 MG (50000 UT) CAPS capsule Take 50,000 Units by mouth every 7 (seven) days.      No current  facility-administered medications for this visit.    Facility-Administered Medications Ordered in Other Visits  Medication Dose Route Frequency Provider Last Rate Last Dose  . sodium chloride flush (NS) 0.9 % injection 10 mL  10 mL Intravenous PRN [name], MD   10 mL at 03/18/18 0858     PHYSICAL EXAMINATION: ECOG PERFORMANCE STATUS: 0 - Asymptomatic Vitals:   11/25/18 0901  BP: (!) 144/84  Pulse: 69  Resp: 18  Temp: (!) 97.2 F (36.2 C)  SpO2: 96%   Filed Weights   11/25/18 0901  Weight: 221 lb 4.8 oz (100.4 kg)    Physical Exam Constitutional:      General: He is not in acute distress. [name]:     Head: Normocephalic and atraumatic.  Eyes:     General: No scleral icterus.    Conjunctiva/sclera: Conjunctivae normal.     Pupils: Pupils are equal, round, and reactive to light.  Neck:     Musculoskeletal: Normal range of motion and neck supple.  Cardiovascular:     Rate and Rhythm: Normal rate and regular rhythm.     Heart sounds: Normal heart sounds.  Pulmonary:     Effort: Pulmonary effort is normal. No respiratory distress.     Breath sounds: No wheezing or rales.     Comments: Decreased breath sound bilaterally Chest:     Chest wall: No tenderness.  Abdominal:     General: Bowel sounds are normal. There is no distension.     Palpations: Abdomen is soft. There is no mass.     Tenderness: There is no abdominal tenderness.  Musculoskeletal: Normal range of motion.        General: No deformity.   Lymphadenopathy:     Cervical: No cervical adenopathy.  Skin:    General: Skin is warm and dry.     Findings: No erythema or rash.  Neurological:     Mental Status: He is alert and oriented to person, place, and time.     Cranial Nerves: No cranial nerve deficit.     Coordination: Coordination normal.  Psychiatric:        Behavior: Behavior normal.        Thought Content: Thought content normal.      LABORATORY DATA:  I have reviewed the data as listed Lab Results  Component Value Date   WBC 6.3 11/25/2018   HGB 13.5 11/25/2018   HCT 38.8 (L) 11/25/2018   MCV 84.2 11/25/2018   PLT 237 11/25/2018   Recent Labs    10/28/18 0939 11/06/18 1411 11/25/18 0830  NA 138 139 138  K 3.9 3.6 3.7  CL 103 104 102  CO2 28 27 27   GLUCOSE 114* 120* 108*  BUN 15 15 11   CREATININE 0.90 0.90 0.79  CALCIUM 9.5 9.2 9.1  GFRNONAA >60 >60 >60  GFRAA >60 >60 >60  PROT 7.5 7.5 7.5  ALBUMIN 4.0 4.0 4.1  AST 25 27 26   ALT 19 21 22   ALKPHOS 63 50 59  BILITOT 0.5 0.5 0.8   RADIOGRAPHIC STUDIES: I have personally reviewed the radiological images as listed and agreed with the findings in the report..  03/21/2018 CT chest showed mild decreasing size of the right upper lobe pulmonary nodule.  Interval decrease in size of right paratracheal and right hilar adenopathy.  06/30/2018 CT chest showed interval development of extensive changes secondary to external beam radiation within the posterior right upper lobe and superior segment of right lower lobe.  The enlarged  right paratracheal lymph node is decreased in size in the interval.  Right upper lobe lung lesion is difficult to separate from surrounding changes due to radiation but is favored to have decrease in size in the interval.  Aortic atherosclerosis and emphysema.  LAD coronary artery atherosclerotic calcification.  08/06/2018 CT head without contrast Brain parenchymal appears unremarkable there is no mass or hemorrhage.    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 Continued further decrease in the size of previously identified right.  Tracheal lymph node. Aortic atherosclerosis.  Emphysema.   ASSESSMENT & PLAN:  1. Primary lung adenocarcinoma, right ([place])   2. Port-A-Cath in place   3. Encounter for antineoplastic immunotherapy   4. Hypokalemia    Stage IIIa T3 N1 M0 lung adenocarcinoma, s/p concurrent chemoradiation.  Currently on immunotherapy durvalumab. So far he has tolerated very well. No significant immunotherapy related toxicities. Labs are reviewed and discussed with patient. Counts acceptable to proceed with cycle 18 durvalumab today. TSH has been monitored has been stable. Continue surveillance CT chest every 3 months.  Last one was done on 10/08/2018.  He is due for another surveillance CT in July 2020.  #Chronic hypokalemia, due to electrolyte wasting.  Stable potassium.  Continue potassium chloride 40 mEq daily  We spent sufficient time to discuss many aspect of care, questions were answered to patient's satisfaction.   Return visit, 2 weeks for evaluation prior to next durvalumab treatment.    [name], MD, PhD Hematology Oncology [facility] Pager- [phone]

## 2018-12-04 ENCOUNTER — Other Ambulatory Visit: Payer: Self-pay | Admitting: Oncology

## 2018-12-08 ENCOUNTER — Other Ambulatory Visit: Payer: Self-pay

## 2018-12-08 DIAGNOSIS — C3491 Malignant neoplasm of unspecified part of right bronchus or lung: Secondary | ICD-10-CM

## 2018-12-09 ENCOUNTER — Encounter: Payer: Self-pay | Admitting: Oncology

## 2018-12-09 ENCOUNTER — Inpatient Hospital Stay (HOSPITAL_BASED_OUTPATIENT_CLINIC_OR_DEPARTMENT_OTHER): Payer: Medicare Other | Admitting: Oncology

## 2018-12-09 ENCOUNTER — Ambulatory Visit: Payer: Medicare Other

## 2018-12-09 ENCOUNTER — Inpatient Hospital Stay: Payer: Medicare Other | Attending: Oncology

## 2018-12-09 ENCOUNTER — Inpatient Hospital Stay: Payer: Medicare Other

## 2018-12-09 ENCOUNTER — Other Ambulatory Visit: Payer: Self-pay

## 2018-12-09 VITALS — BP 145/78 | HR 70 | Temp 95.9°F | Resp 18 | Wt 222.6 lb

## 2018-12-09 DIAGNOSIS — Z87891 Personal history of nicotine dependence: Secondary | ICD-10-CM | POA: Insufficient documentation

## 2018-12-09 DIAGNOSIS — Z5111 Encounter for antineoplastic chemotherapy: Secondary | ICD-10-CM | POA: Insufficient documentation

## 2018-12-09 DIAGNOSIS — Z5112 Encounter for antineoplastic immunotherapy: Secondary | ICD-10-CM

## 2018-12-09 DIAGNOSIS — R0789 Other chest pain: Secondary | ICD-10-CM | POA: Diagnosis not present

## 2018-12-09 DIAGNOSIS — Z79899 Other long term (current) drug therapy: Secondary | ICD-10-CM | POA: Insufficient documentation

## 2018-12-09 DIAGNOSIS — M79602 Pain in left arm: Secondary | ICD-10-CM

## 2018-12-09 DIAGNOSIS — E876 Hypokalemia: Secondary | ICD-10-CM

## 2018-12-09 DIAGNOSIS — C3491 Malignant neoplasm of unspecified part of right bronchus or lung: Secondary | ICD-10-CM

## 2018-12-09 DIAGNOSIS — Z95828 Presence of other vascular implants and grafts: Secondary | ICD-10-CM

## 2018-12-09 LAB — COMPREHENSIVE METABOLIC PANEL
ALT: 21 U/L (ref 0–44)
AST: 24 U/L (ref 15–41)
Albumin: 4.1 g/dL (ref 3.5–5.0)
Alkaline Phosphatase: 64 U/L (ref 38–126)
Anion gap: 9 (ref 5–15)
BUN: 13 mg/dL (ref 8–23)
CO2: 25 mmol/L (ref 22–32)
Calcium: 9.1 mg/dL (ref 8.9–10.3)
Chloride: 104 mmol/L (ref 98–111)
Creatinine, Ser: 0.71 mg/dL (ref 0.61–1.24)
GFR calc Af Amer: >60 mL/min (ref >60)
GFR calc non Af Amer: >60 mL/min (ref >60)
Glucose, Bld: 130 mg/dL — ABNORMAL HIGH (ref 70–99)
Potassium: 3.7 mmol/L (ref 3.5–5.1)
Sodium: 138 mmol/L (ref 135–145)
Total Bilirubin: 0.7 mg/dL (ref 0.3–1.2)
Total Protein: 7.5 g/dL (ref 6.5–8.1)

## 2018-12-09 LAB — CBC WITH DIFFERENTIAL/PLATELET
Abs Immature Granulocytes: 0.02 10*3/uL (ref 0.00–0.07)
Basophils Absolute: 0 10*3/uL (ref 0.0–0.1)
Basophils Relative: 1 %
Eosinophils Absolute: 0.1 10*3/uL (ref 0.0–0.5)
Eosinophils Relative: 2 %
HCT: 40.5 % (ref 39.0–52.0)
Hemoglobin: 13.8 g/dL (ref 13.0–17.0)
Immature Granulocytes: 0 %
Lymphocytes Relative: 11 %
Lymphs Abs: 0.6 10*3/uL — ABNORMAL LOW (ref 0.7–4.0)
MCH: 28.8 pg (ref 26.0–34.0)
MCHC: 34.1 g/dL (ref 30.0–36.0)
MCV: 84.6 fL (ref 80.0–100.0)
Monocytes Absolute: 0.7 10*3/uL (ref 0.1–1.0)
Monocytes Relative: 14 %
Neutro Abs: 3.7 10*3/uL (ref 1.7–7.7)
Neutrophils Relative %: 72 %
Platelets: 252 10*3/uL (ref 150–400)
RBC: 4.79 MIL/uL (ref 4.22–5.81)
RDW: 13.7 % (ref 11.5–15.5)
WBC: 5.2 10*3/uL (ref 4.0–10.5)
nRBC: 0 % (ref 0.0–0.2)

## 2018-12-09 MED ORDER — HEPARIN SOD (PORK) LOCK FLUSH 100 UNIT/ML IV SOLN
500.0000 [IU] | Freq: Once | INTRAVENOUS | Status: AC
  Administered 2018-12-09: 500 [IU] via INTRAVENOUS

## 2018-12-09 MED ORDER — SODIUM CHLORIDE 0.9% FLUSH
10.0000 mL | Freq: Once | INTRAVENOUS | Status: AC
  Administered 2018-12-09: 10 mL via INTRAVENOUS
  Filled 2018-12-09: qty 10

## 2018-12-09 MED ORDER — SODIUM CHLORIDE 0.9 % IV SOLN
10.0000 mg/kg | Freq: Once | INTRAVENOUS | Status: AC
  Administered 2018-12-09: 960 mg via INTRAVENOUS
  Filled 2018-12-09: qty 19.2

## 2018-12-09 MED ORDER — SODIUM CHLORIDE 0.9 % IV SOLN
Freq: Once | INTRAVENOUS | Status: AC
  Administered 2018-12-09: 09:00:00 via INTRAVENOUS
  Filled 2018-12-09: qty 250

## 2018-12-09 MED ORDER — HEPARIN SOD (PORK) LOCK FLUSH 100 UNIT/ML IV SOLN
INTRAVENOUS | Status: AC
  Filled 2018-12-09: qty 5

## 2018-12-09 NOTE — Progress Notes (Signed)
Hematology/Oncology Follow up note [facility] Telephone:(336) [phone] Fax:(336) [phone]   Patient Care Team: [name], MD as PCP - General (Internal Medicine) [name], RN as Registered Nurse   REASON FOR VISIT Follow up for immunotherapy treatment for stage IIIA lung adenocarcinoma  Oncology History:  Mark Mccarty is a  68 y.o.  male with  adenocarcinoma of lung, diagnosed via biopsy of right hilar lymph nodes.  Former 43 pack year smoking history.  Presents for assessment prior to concurrent chemo T3 N1 M0  INTERVAL HISTORY ROCKLIN SODERQUIST is a 68 y.o. male who has above history reviewed by me presents for evaluation prior to immunotherapy with durvalumab for treatment of stage III A non-small cell lung cancer.  He reports doing well, except feeling mild arm "pressure" feeling which he feels is coming from the medi port.  Denies any SOB, chest pain, abdominal pain, cough wheezing.   Patient is taking potassium chloride 40 mEq daily.  . Review of Systems  Constitutional: Negative for appetite change, chills, fatigue, fever and unexpected weight change.  [name]:   Negative for hearing loss and voice change.   Eyes: Negative for eye problems and icterus.  Respiratory: Negative for chest tightness, cough and shortness of breath.   Cardiovascular: Negative for chest pain and leg swelling.  Gastrointestinal: Negative for abdominal distention and abdominal pain.  Endocrine: Negative for hot flashes.  Genitourinary: Negative for difficulty urinating, dysuria and frequency.   Musculoskeletal: Negative for arthralgias.  Skin: Negative for itching and rash.  Neurological: Negative for light-headedness and numbness.  [name]: Negative for adenopathy. Does not bruise/bleed easily.  Psychiatric/Behavioral: Negative for confusion.     MEDICAL HISTORY:  Past Medical History:  Diagnosis Date  . Asthma   . Cancer ([place])    lung  . Cataract     left cataract surgery  . Dyspnea   . [name]   . GERD (gastroesophageal reflux disease)   . Hypertension   . Neuromuscular disorder ([place])    pt has knees bilateral with tendon issues that cause pain    SURGICAL HISTORY: Past Surgical History:  Procedure Laterality Date  . CARDIAC CATHETERIZATION N/A 12/16/2014   Procedure: Left Heart Cath;  Surgeon: [name], MD;  Location: [place] [facility];  Service: Cardiovascular;  Laterality: N/A;  . CYST REMOVAL TRUNK     chest and back over time and it was removed  . ENDOBRONCHIAL ULTRASOUND N/A 12/09/2017   Procedure: ENDOBRONCHIAL ULTRASOUND;  Surgeon: [name], MD;  Location: [name];  Service: Pulmonary;  Laterality: N/A;  . left cataract surgery    . PORTA CATH INSERTION N/A 12/23/2017   Procedure: PORTA CATH INSERTION;  Surgeon: [name], MD;  Location: [place] [facility];  Service: Cardiovascular;  Laterality: N/A;    SOCIAL HISTORY: Social History   Socioeconomic History  . Marital status: Divorced    Spouse name: Not on file  . Number of children: Not on file  . Years of education: Not on file  . Highest education level: Not on file  Occupational History  . Not on file  Social Needs  . Financial resource strain: Not on file  . Food insecurity:    Worry: Not on file    Inability: Not on file  . Transportation needs:    Medical: Not on file    Non-medical: Not on file  Tobacco Use  . Smoking status: Former Smoker    Packs/day: 1.50  Years: 35.00    Pack years: 52.50    Types: Cigarettes    Last attempt to quit: 06/22/2015    Years since quitting: 3.4  . Smokeless tobacco: Never Used  Substance and Sexual Activity  . Alcohol use: Yes    Comment: a fifth in a week  . Drug use: No  . Sexual activity: Not Currently  Lifestyle  . Physical activity:    Days per week: Not on file    Minutes per session: Not on file  . Stress: Not on file  Relationships  . Social connections:     Talks on phone: Not on file    Gets together: Not on file    Attends religious service: Not on file    Active member of club or organization: Not on file    Attends meetings of clubs or organizations: Not on file    Relationship status: Not on file  . Intimate partner violence:    Fear of current or ex partner: Not on file    Emotionally abused: Not on file    Physically abused: Not on file    Forced sexual activity: Not on file  Other Topics Concern  . Not on file  Social History Narrative  . Not on file    FAMILY HISTORY: Family History  Problem Relation Age of Onset  . Hypertension Mother   . Breast cancer Mother   . Heart attack Mother   . Lung cancer Father   . Heart disease Sister   . Diabetes Sister   . Colon cancer Sister     ALLERGIES:  is allergic to penicillins.  MEDICATIONS:  Current Outpatient Medications  Medication Sig Dispense Refill  . acetaminophen (TYLENOL) 500 MG tablet Take 500 mg by mouth as needed.    [name] albuterol (PROVENTIL HFA;VENTOLIN HFA) 108 (90 Base) MCG/ACT inhaler Inhale 2 puffs into the lungs every 6 (six) hours as needed.  12  . amLODipine (NORVASC) 10 MG tablet Take 10 mg by mouth daily.    Jearl Klinefelter ELLIPTA 62.5-25 MCG/INH AEPB TAKE 1 PUFF BY MOUTH EVERY DAY 180 each 3  . aspirin 81 MG tablet Take 81 mg by mouth daily.    . carvedilol (COREG) 25 MG tablet Take 25 mg by mouth 2 (two) times daily with a meal.    . fluticasone (FLONASE) 50 MCG/ACT nasal spray PLACE 1 SPRAY INTO BOTH NOSTRILS 2 (TWO) TIMES DAILY  0  . gabapentin (NEURONTIN) 300 MG capsule Take 300 mg by mouth at bedtime as needed.     [name] ibuprofen (ADVIL,MOTRIN) 600 MG tablet Take 600 mg by mouth every 6 (six) hours as needed.    [name] KLOR-CON M20 20 MEQ tablet TAKE 2 TABLETS BY MOUTH DAILY 60 tablet 2  . lidocaine-prilocaine (EMLA) cream Apply to port area once 30 g 3  . lisinopril (PRINIVIL,ZESTRIL) 40 MG tablet Take 40 mg by mouth daily.    . pravastatin (PRAVACHOL) 20 MG  tablet Take 20 mg by mouth at bedtime.    . prochlorperazine (COMPAZINE) 10 MG tablet Take 1 tablet (10 mg total) by mouth every 6 (six) hours as needed (Nausea or vomiting). 30 tablet 1  . senna (SENOKOT) 8.6 MG TABS tablet Take 2 tablets (17.2 mg total) by mouth daily. 120 each 0  . tamsulosin (FLOMAX) 0.4 MG CAPS capsule Take 0.4 mg by mouth daily.     No current facility-administered medications for this visit.    Facility-Administered Medications Ordered in Other  Visits  Medication Dose Route Frequency Provider Last Rate Last Dose  . sodium chloride flush (NS) 0.9 % injection 10 mL  10 mL Intravenous PRN [name], MD   10 mL at 03/18/18 0858     PHYSICAL EXAMINATION: ECOG PERFORMANCE STATUS: 0 - Asymptomatic Vitals:   12/09/18 0844  BP: (!) 145/78  Pulse: 70  Resp: 18  Temp: (!) 95.9 F (35.5 C)  SpO2: 96%   Filed Weights   12/09/18 0844  Weight: 222 lb 9.6 oz (101 kg)    Physical Exam Constitutional:      General: He is not in acute distress. [name]:     Head: Normocephalic and atraumatic.  Eyes:     General: No scleral icterus.    Conjunctiva/sclera: Conjunctivae normal.     Pupils: Pupils are equal, round, and reactive to light.  Neck:     Musculoskeletal: Normal range of motion and neck supple.     Comments: Mild left supra clavicular swelling. Cardiovascular:     Rate and Rhythm: Normal rate and regular rhythm.     Heart sounds: Normal heart sounds.  Pulmonary:     Effort: Pulmonary effort is normal. No respiratory distress.     Breath sounds: No wheezing or rales.     Comments: Decreased breath sound bilaterally Chest:     Chest wall: No tenderness.  Abdominal:     General: Bowel sounds are normal. There is no distension.     Palpations: Abdomen is soft. There is no mass.     Tenderness: There is no abdominal tenderness.  Musculoskeletal: Normal range of motion.        General: No deformity.  Lymphadenopathy:     Cervical: No cervical adenopathy.   Skin:    General: Skin is warm and dry.     Findings: No erythema or rash.  Neurological:     Mental Status: He is alert and oriented to person, place, and time.     Cranial Nerves: No cranial nerve deficit.     Coordination: Coordination normal.  Psychiatric:        Behavior: Behavior normal.        Thought Content: Thought content normal.      LABORATORY DATA:  I have reviewed the data as listed Lab Results  Component Value Date   WBC 5.2 12/09/2018   HGB 13.8 12/09/2018   HCT 40.5 12/09/2018   MCV 84.6 12/09/2018   PLT 252 12/09/2018   Recent Labs    11/06/18 1411 11/25/18 0830 12/09/18 0819  NA 139 138 138  K 3.6 3.7 3.7  CL 104 102 104  CO2 27 27 25   GLUCOSE 120* 108* 130*  BUN 15 11 13   CREATININE 0.90 0.79 0.71  CALCIUM 9.2 9.1 9.1  GFRNONAA >60 >60 >60  GFRAA >60 >60 >60  PROT 7.5 7.5 7.5  ALBUMIN 4.0 4.1 4.1  AST 27 26 24   ALT 21 22 21   ALKPHOS 50 59 64  BILITOT 0.5 0.8 0.7   RADIOGRAPHIC STUDIES: I have personally reviewed the radiological images as listed and agreed with the findings in the report..  03/21/2018 CT chest showed mild decreasing size of the right upper lobe pulmonary nodule.  Interval decrease in size of right paratracheal and right hilar adenopathy.  06/30/2018 CT chest showed interval development of extensive changes secondary to external beam radiation within the posterior right upper lobe and superior segment of right lower lobe.  The enlarged right paratracheal lymph node is  decreased in size in the interval.  Right upper lobe lung lesion is difficult to separate from surrounding changes due to radiation but is favored to have decrease in size in the interval.  Aortic atherosclerosis and emphysema.  LAD coronary artery atherosclerotic calcification.  08/06/2018 CT head without contrast Brain parenchymal appears unremarkable there is no mass or hemorrhage.    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 Continued further decrease in the size of previously identified right.  Tracheal lymph node. Aortic atherosclerosis.  Emphysema.   ASSESSMENT & PLAN:  1. Primary lung adenocarcinoma, right ([place])   2. Port-A-Cath in place   3. Pain of left upper extremity   4. Encounter for antineoplastic immunotherapy    Stage IIIa T3 N1 M0 lung adenocarcinoma, s/p concurrent chemoradiation.  Currently on immunotherapy durvalumab. Tolerating well.  No significant immunotherapy related toxicities Labs are reviewed and discussed with patient. Counts acceptable to proceed with cycle 19 durvalumab treatment today. TSH has been monitored and has been stable.  Continue surveillance CT chest every 3 months.  Next due in July 2020.  #Chronic hypokalemia, potassium level has been stable.  Continue potassium chloride 40 mEq daily. #Left arm/chest wall pressure, mild left supra clavicular swelling.  Will obtain ultrasound to rule out catheter induced DVT.  We spent sufficient time to discuss many aspect of care, questions were answered to patient's satisfaction.   Return visit, 2 weeks for evaluation prior to next durvalumab treatment.  Orders Placed This Encounter  Procedures  . US Venous Img Upper Uni Left    Standing Status:   Future    Standing Expiration Date:   12/09/2019    Order Specific Question:   Reason for Exam (SYMPTOM  OR DIAGNOSIS REQUIRED)    Answer:   left arm soreness, medi port.    Order Specific Question:   Preferred imaging location?    Answer:   [facility]     [name], MD, PhD

## 2018-12-09 NOTE — Progress Notes (Signed)
Patient here for follow-up. Pt states he is starting to feel some pressure to  left side of chest where port is. He feels "its coming from the port."  It has been going on for about a month and it is not continuous, he can only feel it at times.

## 2018-12-10 ENCOUNTER — Ambulatory Visit
Admission: RE | Admit: 2018-12-10 | Discharge: 2018-12-10 | Disposition: A | Discharge status: Home / Self Care | Payer: Medicare Other | Source: Ambulatory Visit | Attending: Oncology | Admitting: Oncology

## 2018-12-10 ENCOUNTER — Other Ambulatory Visit: Payer: Self-pay

## 2018-12-10 DIAGNOSIS — M79602 Pain in left arm: Secondary | ICD-10-CM | POA: Diagnosis present

## 2018-12-10 DIAGNOSIS — M87059 Idiopathic aseptic necrosis of unspecified femur: Secondary | ICD-10-CM | POA: Insufficient documentation

## 2018-12-10 DIAGNOSIS — Z95828 Presence of other vascular implants and grafts: Secondary | ICD-10-CM | POA: Diagnosis not present

## 2018-12-10 LAB — TSH: TSH: 2.492 u[IU]/mL (ref 0.350–4.500)

## 2018-12-23 ENCOUNTER — Inpatient Hospital Stay: Payer: Medicare Other

## 2018-12-23 ENCOUNTER — Encounter: Payer: Self-pay | Admitting: Oncology

## 2018-12-23 ENCOUNTER — Other Ambulatory Visit: Payer: Self-pay

## 2018-12-23 ENCOUNTER — Inpatient Hospital Stay (HOSPITAL_BASED_OUTPATIENT_CLINIC_OR_DEPARTMENT_OTHER): Payer: Medicare Other | Admitting: Oncology

## 2018-12-23 VITALS — BP 151/79 | HR 69 | Temp 97.9°F | Resp 18 | Wt 223.6 lb

## 2018-12-23 DIAGNOSIS — Z5112 Encounter for antineoplastic immunotherapy: Secondary | ICD-10-CM

## 2018-12-23 DIAGNOSIS — M79602 Pain in left arm: Secondary | ICD-10-CM | POA: Diagnosis not present

## 2018-12-23 DIAGNOSIS — E876 Hypokalemia: Secondary | ICD-10-CM

## 2018-12-23 DIAGNOSIS — C3491 Malignant neoplasm of unspecified part of right bronchus or lung: Secondary | ICD-10-CM

## 2018-12-23 DIAGNOSIS — Z87891 Personal history of nicotine dependence: Secondary | ICD-10-CM | POA: Diagnosis not present

## 2018-12-23 DIAGNOSIS — Z5111 Encounter for antineoplastic chemotherapy: Secondary | ICD-10-CM | POA: Diagnosis not present

## 2018-12-23 DIAGNOSIS — Z95828 Presence of other vascular implants and grafts: Secondary | ICD-10-CM

## 2018-12-23 LAB — CBC WITH DIFFERENTIAL/PLATELET
Abs Immature Granulocytes: 0.02 10*3/uL (ref 0.00–0.07)
Basophils Absolute: 0 10*3/uL (ref 0.0–0.1)
Basophils Relative: 0 %
Eosinophils Absolute: 0.1 10*3/uL (ref 0.0–0.5)
Eosinophils Relative: 2 %
HCT: 39.6 % (ref 39.0–52.0)
Hemoglobin: 13.7 g/dL (ref 13.0–17.0)
Immature Granulocytes: 0 %
Lymphocytes Relative: 12 %
Lymphs Abs: 0.7 10*3/uL (ref 0.7–4.0)
MCH: 29.1 pg (ref 26.0–34.0)
MCHC: 34.6 g/dL (ref 30.0–36.0)
MCV: 84.3 fL (ref 80.0–100.0)
Monocytes Absolute: 0.9 10*3/uL (ref 0.1–1.0)
Monocytes Relative: 16 %
Neutro Abs: 4 10*3/uL (ref 1.7–7.7)
Neutrophils Relative %: 70 %
Platelets: 234 10*3/uL (ref 150–400)
RBC: 4.7 MIL/uL (ref 4.22–5.81)
RDW: 13.8 % (ref 11.5–15.5)
WBC: 5.8 10*3/uL (ref 4.0–10.5)
nRBC: 0.5 % — ABNORMAL HIGH (ref 0.0–0.2)

## 2018-12-23 LAB — COMPREHENSIVE METABOLIC PANEL
ALT: 28 U/L (ref 0–44)
AST: 27 U/L (ref 15–41)
Albumin: 4.1 g/dL (ref 3.5–5.0)
Alkaline Phosphatase: 59 U/L (ref 38–126)
Anion gap: 9 (ref 5–15)
BUN: 10 mg/dL (ref 8–23)
CO2: 28 mmol/L (ref 22–32)
Calcium: 8.9 mg/dL (ref 8.9–10.3)
Chloride: 101 mmol/L (ref 98–111)
Creatinine, Ser: 0.85 mg/dL (ref 0.61–1.24)
GFR calc Af Amer: >60 mL/min (ref >60)
GFR calc non Af Amer: >60 mL/min (ref >60)
Glucose, Bld: 122 mg/dL — ABNORMAL HIGH (ref 70–99)
Potassium: 3.3 mmol/L — ABNORMAL LOW (ref 3.5–5.1)
Sodium: 138 mmol/L (ref 135–145)
Total Bilirubin: 0.8 mg/dL (ref 0.3–1.2)
Total Protein: 7.3 g/dL (ref 6.5–8.1)

## 2018-12-23 LAB — TSH: TSH: 3.675 u[IU]/mL (ref 0.350–4.500)

## 2018-12-23 MED ORDER — SODIUM CHLORIDE 0.9% FLUSH
10.0000 mL | Freq: Once | INTRAVENOUS | Status: AC
  Administered 2018-12-23: 10 mL via INTRAVENOUS
  Filled 2018-12-23: qty 10

## 2018-12-23 MED ORDER — SODIUM CHLORIDE 0.9 % IV SOLN
Freq: Once | INTRAVENOUS | Status: AC
  Administered 2018-12-23: 10:00:00 via INTRAVENOUS
  Filled 2018-12-23: qty 250

## 2018-12-23 MED ORDER — HEPARIN SOD (PORK) LOCK FLUSH 100 UNIT/ML IV SOLN
500.0000 [IU] | Freq: Once | INTRAVENOUS | Status: AC | PRN
  Administered 2018-12-23: 11:00:00 500 [IU]

## 2018-12-23 MED ORDER — SODIUM CHLORIDE 0.9 % IV SOLN
10.0000 mg/kg | Freq: Once | INTRAVENOUS | Status: AC
  Administered 2018-12-23: 960 mg via INTRAVENOUS
  Filled 2018-12-23: qty 9.6

## 2018-12-24 NOTE — Progress Notes (Signed)
Hematology/Oncology Follow up note [facility] Telephone:(336) [phone] Fax:(336) [phone]   Patient Care Team: [name], MD as PCP - General (Internal Medicine) [name], RN as Registered Nurse   REASON FOR VISIT Follow up for immunotherapy treatment for stage IIIA lung adenocarcinoma  Oncology History:  Mark Mccarty is a  68 y.o.  male with  adenocarcinoma of lung, diagnosed via biopsy of right hilar lymph nodes.  Former 43 pack year smoking history.  Presents for assessment prior to concurrent chemo T3 N1 M0  INTERVAL HISTORY ANCIL DEWAN is a 68 y.o. male who has above history reviewed by me presents for evaluation prior to immunotherapy with durvalumab for treatment of stage III A non-small cell lung cancer. Patient reports doing well.  Left arm pressure feeling has resolved.  No additional episodes. Ultrasound left upper extremity shows no DVT.  Denies any shortness of breath chest pain, abdominal pain, cough or wheezing Chronic hypokalemia, he takes potassium chloride 40 mEq daily . [name] Review of Systems  Constitutional: Negative for appetite change, chills, fatigue, fever and unexpected weight change.  [name]:   Negative for hearing loss and voice change.   Eyes: Negative for eye problems and icterus.  Respiratory: Negative for chest tightness, cough and shortness of breath.   Cardiovascular: Negative for chest pain and leg swelling.  Gastrointestinal: Negative for abdominal distention and abdominal pain.  Endocrine: Negative for hot flashes.  Genitourinary: Negative for difficulty urinating, dysuria and frequency.   Musculoskeletal: Negative for arthralgias.  Skin: Negative for itching and rash.  Neurological: Negative for light-headedness and numbness.  [name]: Negative for adenopathy. Does not bruise/bleed easily.  Psychiatric/Behavioral: Negative for confusion.     MEDICAL HISTORY:  Past Medical History:  Diagnosis  Date  . Asthma   . Cancer ([place])    lung  . Cataract    left cataract surgery  . Dyspnea   . [name]   . GERD (gastroesophageal reflux disease)   . Hypertension   . Neuromuscular disorder ([place])    pt has knees bilateral with tendon issues that cause pain    SURGICAL HISTORY: Past Surgical History:  Procedure Laterality Date  . CARDIAC CATHETERIZATION N/A 12/16/2014   Procedure: Left Heart Cath;  Surgeon: [name], MD;  Location: [place] [facility];  Service: Cardiovascular;  Laterality: N/A;  . CYST REMOVAL TRUNK     chest and back over time and it was removed  . ENDOBRONCHIAL ULTRASOUND N/A 12/09/2017   Procedure: ENDOBRONCHIAL ULTRASOUND;  Surgeon: [name], MD;  Location: [name];  Service: Pulmonary;  Laterality: N/A;  . left cataract surgery    . PORTA CATH INSERTION N/A 12/23/2017   Procedure: PORTA CATH INSERTION;  Surgeon: [name], MD;  Location: [place] [facility];  Service: Cardiovascular;  Laterality: N/A;    SOCIAL HISTORY: Social History   Socioeconomic History  . Marital status: Divorced    Spouse name: Not on file  . Number of children: Not on file  . Years of education: Not on file  . Highest education level: Not on file  Occupational History  . Not on file  Social Needs  . Financial resource strain: Not on file  . Food insecurity    Worry: Not on file    Inability: Not on file  . Transportation needs    Medical: Not on file    Non-medical: Not on file  Tobacco Use  . Smoking status: Former Smoker    Packs/day:  1.50    Years: 35.00    Pack years: 52.50    Types: Cigarettes    Quit date: 06/22/2015    Years since quitting: 3.5  . Smokeless tobacco: Never Used  Substance and Sexual Activity  . Alcohol use: Yes    Comment: a fifth in a week  . Drug use: No  . Sexual activity: Not Currently  Lifestyle  . Physical activity    Days per week: Not on file    Minutes per session: Not on file  . Stress: Not on  file  Relationships  . Social Herbalist on phone: Not on file    Gets together: Not on file    Attends religious service: Not on file    Active member of club or organization: Not on file    Attends meetings of clubs or organizations: Not on file    Relationship status: Not on file  . Intimate partner violence    Fear of current or ex partner: Not on file    Emotionally abused: Not on file    Physically abused: Not on file    Forced sexual activity: Not on file  Other Topics Concern  . Not on file  Social History Narrative  . Not on file    FAMILY HISTORY: Family History  Problem Relation Age of Onset  . Hypertension Mother   . Breast cancer Mother   . Heart attack Mother   . Lung cancer Father   . Heart disease Sister   . Diabetes Sister   . Colon cancer Sister     ALLERGIES:  is allergic to penicillins.  MEDICATIONS:  Current Outpatient Medications  Medication Sig Dispense Refill  . acetaminophen (TYLENOL) 500 MG tablet Take 500 mg by mouth as needed.    [name] albuterol (PROVENTIL HFA;VENTOLIN HFA) 108 (90 Base) MCG/ACT inhaler Inhale 2 puffs into the lungs every 6 (six) hours as needed.  12  . amLODipine (NORVASC) 10 MG tablet Take 10 mg by mouth daily.    Jearl Klinefelter ELLIPTA 62.5-25 MCG/INH AEPB TAKE 1 PUFF BY MOUTH EVERY DAY 180 each 3  . aspirin 81 MG tablet Take 81 mg by mouth daily.    . carvedilol (COREG) 25 MG tablet Take 25 mg by mouth 2 (two) times daily with a meal.    . fluticasone (FLONASE) 50 MCG/ACT nasal spray PLACE 1 SPRAY INTO BOTH NOSTRILS 2 (TWO) TIMES DAILY  0  . gabapentin (NEURONTIN) 300 MG capsule Take 300 mg by mouth at bedtime as needed.     [name] ibuprofen (ADVIL,MOTRIN) 600 MG tablet Take 600 mg by mouth every 6 (six) hours as needed.    [name] KLOR-CON M20 20 MEQ tablet TAKE 2 TABLETS BY MOUTH DAILY 60 tablet 2  . lidocaine-prilocaine (EMLA) cream Apply to port area once 30 g 3  . lisinopril (PRINIVIL,ZESTRIL) 40 MG tablet Take 40 mg by mouth  daily.    . pravastatin (PRAVACHOL) 20 MG tablet Take 20 mg by mouth at bedtime.    . prochlorperazine (COMPAZINE) 10 MG tablet Take 1 tablet (10 mg total) by mouth every 6 (six) hours as needed (Nausea or vomiting). 30 tablet 1  . senna (SENOKOT) 8.6 MG TABS tablet Take 2 tablets (17.2 mg total) by mouth daily. 120 each 0  . tamsulosin (FLOMAX) 0.4 MG CAPS capsule Take 0.4 mg by mouth daily.     No current facility-administered medications for this visit.    Facility-Administered Medications Ordered  in Other Visits  Medication Dose Route Frequency Provider Last Rate Last Dose  . sodium chloride flush (NS) 0.9 % injection 10 mL  10 mL Intravenous PRN [name], MD   10 mL at 03/18/18 0858     PHYSICAL EXAMINATION: ECOG PERFORMANCE STATUS: 0 - Asymptomatic Vitals:   12/23/18 0852  BP: (!) 151/79  Pulse: 69  Resp: 18  Temp: 97.9 F (36.6 C)  SpO2: 97%   Filed Weights   12/23/18 0852  Weight: 223 lb 9.6 oz (101.4 kg)    Physical Exam Constitutional:      General: He is not in acute distress. [name]:     Head: Normocephalic and atraumatic.  Eyes:     General: No scleral icterus.    Conjunctiva/sclera: Conjunctivae normal.     Pupils: Pupils are equal, round, and reactive to light.  Neck:     Musculoskeletal: Normal range of motion and neck supple.  Cardiovascular:     Rate and Rhythm: Normal rate and regular rhythm.     Heart sounds: Normal heart sounds.  Pulmonary:     Effort: Pulmonary effort is normal. No respiratory distress.     Breath sounds: No wheezing or rales.     Comments: Decreased breath sound bilaterally Chest:     Chest wall: No tenderness.  Abdominal:     General: Bowel sounds are normal. There is no distension.     Palpations: Abdomen is soft. There is no mass.     Tenderness: There is no abdominal tenderness.  Musculoskeletal: Normal range of motion.        General: No deformity.  Lymphadenopathy:     Cervical: No cervical adenopathy.  Skin:     General: Skin is warm and dry.     Findings: No erythema or rash.  Neurological:     Mental Status: He is alert and oriented to person, place, and time.     Cranial Nerves: No cranial nerve deficit.     Coordination: Coordination normal.  Psychiatric:        Behavior: Behavior normal.        Thought Content: Thought content normal.      LABORATORY DATA:  I have reviewed the data as listed Lab Results  Component Value Date   WBC 5.8 12/23/2018   HGB 13.7 12/23/2018   HCT 39.6 12/23/2018   MCV 84.3 12/23/2018   PLT 234 12/23/2018   Recent Labs    11/25/18 0830 12/09/18 0819 12/23/18 0833  NA 138 138 138  K 3.7 3.7 3.3*  CL 102 104 101  CO2 27 25 28   GLUCOSE 108* 130* 122*  BUN 11 13 10   CREATININE 0.79 0.71 0.85  CALCIUM 9.1 9.1 8.9  GFRNONAA >60 >60 >60  GFRAA >60 >60 >60  PROT 7.5 7.5 7.3  ALBUMIN 4.1 4.1 4.1  AST 26 24 27   ALT 22 21 28   ALKPHOS 59 64 59  BILITOT 0.8 0.7 0.8   RADIOGRAPHIC STUDIES: I have personally reviewed the radiological images as listed and agreed with the findings in the report..  03/21/2018 CT chest showed mild decreasing size of the right upper lobe pulmonary nodule.  Interval decrease in size of right paratracheal and right hilar adenopathy.  06/30/2018 CT chest showed interval development of extensive changes secondary to external beam radiation within the posterior right upper lobe and superior segment of right lower lobe.  The enlarged right paratracheal lymph node is decreased in size in the interval.  Right  upper lobe lung lesion is difficult to separate from surrounding changes due to radiation but is favored to have decrease in size in the interval.  Aortic atherosclerosis and emphysema.  LAD coronary artery atherosclerotic calcification.  08/06/2018 CT head without contrast Brain parenchymal appears unremarkable there is no mass or hemorrhage.    10/08/2018 CT chest without contrast Interval evolution of radiation fibrosis in the  perihilar right lung with increased right upper lobe volume loss. Continued further decrease in the size of previously identified right.  Tracheal lymph node. Aortic atherosclerosis.  Emphysema.   ASSESSMENT & PLAN:  1. Primary lung adenocarcinoma, right ([place])   2. Hypokalemia   3. Encounter for antineoplastic immunotherapy   4. Pain of left upper extremity    Stage IIIa T3 N1 M0 lung adenocarcinoma, s/p concurrent chemoradiation.  Currently on immunotherapy durvalumab. Clinically doing well.  No significant immunotherapy related toxicities .  I reviewed patient's lab results and discussed with him. Counts acceptable to proceed with cycle 20 Durvalumab treatments today. TSH has been monitored and has been stable.  Chronic hypokalemia, potassium level has been stable.  Potassium level 3.3, slightly low.  Continue potassium chloride 40 mEq daily.  Continue to monitor.  Left arm/chest wall pressure, ultrasound venous left upper extremity no DVT.  Patient denies any additional episodes of pressure sensation. Continue to monitor.  We spent sufficient time to discuss many aspect of care, questions were answered to patient's satisfaction.   Return visit, 2 weeks for evaluation prior to next durvalumab treatment.  Orders Placed This Encounter  Procedures  . CT Chest Wo Contrast    Standing Status:   Future    Standing Expiration Date:   12/23/2019    Order Specific Question:   Preferred imaging location?    Answer:   [facility]    Order Specific Question:   Radiology Contrast Protocol - do NOT remove file path    Answer:   [email or web address]     [name], MD, PhD

## 2019-01-06 ENCOUNTER — Other Ambulatory Visit: Payer: Self-pay

## 2019-01-06 ENCOUNTER — Inpatient Hospital Stay: Payer: Medicare Other

## 2019-01-06 ENCOUNTER — Encounter: Payer: Self-pay | Admitting: Oncology

## 2019-01-06 ENCOUNTER — Inpatient Hospital Stay: Payer: Medicare Other | Attending: Oncology

## 2019-01-06 ENCOUNTER — Inpatient Hospital Stay (HOSPITAL_BASED_OUTPATIENT_CLINIC_OR_DEPARTMENT_OTHER): Payer: Medicare Other | Admitting: Oncology

## 2019-01-06 VITALS — BP 147/85 | HR 72 | Temp 96.9°F | Resp 18 | Ht 72.0 in | Wt 222.2 lb

## 2019-01-06 DIAGNOSIS — Z87891 Personal history of nicotine dependence: Secondary | ICD-10-CM | POA: Insufficient documentation

## 2019-01-06 DIAGNOSIS — Z5112 Encounter for antineoplastic immunotherapy: Secondary | ICD-10-CM | POA: Insufficient documentation

## 2019-01-06 DIAGNOSIS — C3491 Malignant neoplasm of unspecified part of right bronchus or lung: Secondary | ICD-10-CM | POA: Diagnosis present

## 2019-01-06 DIAGNOSIS — Z79899 Other long term (current) drug therapy: Secondary | ICD-10-CM | POA: Diagnosis not present

## 2019-01-06 DIAGNOSIS — Z95828 Presence of other vascular implants and grafts: Secondary | ICD-10-CM

## 2019-01-06 DIAGNOSIS — E876 Hypokalemia: Secondary | ICD-10-CM

## 2019-01-06 LAB — COMPREHENSIVE METABOLIC PANEL
ALT: 21 U/L (ref 0–44)
AST: 25 U/L (ref 15–41)
Albumin: 4.1 g/dL (ref 3.5–5.0)
Alkaline Phosphatase: 55 U/L (ref 38–126)
Anion gap: 10 (ref 5–15)
BUN: 11 mg/dL (ref 8–23)
CO2: 26 mmol/L (ref 22–32)
Calcium: 8.7 mg/dL — ABNORMAL LOW (ref 8.9–10.3)
Chloride: 103 mmol/L (ref 98–111)
Creatinine, Ser: 0.78 mg/dL (ref 0.61–1.24)
GFR calc Af Amer: >60 mL/min (ref >60)
GFR calc non Af Amer: >60 mL/min (ref >60)
Glucose, Bld: 124 mg/dL — ABNORMAL HIGH (ref 70–99)
Potassium: 3.6 mmol/L (ref 3.5–5.1)
Sodium: 139 mmol/L (ref 135–145)
Total Bilirubin: 0.7 mg/dL (ref 0.3–1.2)
Total Protein: 7.3 g/dL (ref 6.5–8.1)

## 2019-01-06 LAB — CBC WITH DIFFERENTIAL/PLATELET
Abs Immature Granulocytes: 0.02 10*3/uL (ref 0.00–0.07)
Basophils Absolute: 0 10*3/uL (ref 0.0–0.1)
Basophils Relative: 0 %
Eosinophils Absolute: 0.1 10*3/uL (ref 0.0–0.5)
Eosinophils Relative: 2 %
HCT: 39.7 % (ref 39.0–52.0)
Hemoglobin: 13.6 g/dL (ref 13.0–17.0)
Immature Granulocytes: 0 %
Lymphocytes Relative: 14 %
Lymphs Abs: 0.7 10*3/uL (ref 0.7–4.0)
MCH: 28.8 pg (ref 26.0–34.0)
MCHC: 34.3 g/dL (ref 30.0–36.0)
MCV: 84.1 fL (ref 80.0–100.0)
Monocytes Absolute: 0.6 10*3/uL (ref 0.1–1.0)
Monocytes Relative: 12 %
Neutro Abs: 3.5 10*3/uL (ref 1.7–7.7)
Neutrophils Relative %: 72 %
Platelets: 233 10*3/uL (ref 150–400)
RBC: 4.72 MIL/uL (ref 4.22–5.81)
RDW: 13.7 % (ref 11.5–15.5)
WBC: 5 10*3/uL (ref 4.0–10.5)
nRBC: 0 % (ref 0.0–0.2)

## 2019-01-06 MED ORDER — HEPARIN SOD (PORK) LOCK FLUSH 100 UNIT/ML IV SOLN
500.0000 [IU] | Freq: Once | INTRAVENOUS | Status: AC
  Administered 2019-01-06: 11:00:00 500 [IU] via INTRAVENOUS
  Filled 2019-01-06: qty 5

## 2019-01-06 MED ORDER — SODIUM CHLORIDE 0.9 % IV SOLN
Freq: Once | INTRAVENOUS | Status: AC
  Administered 2019-01-06: 09:00:00 via INTRAVENOUS
  Filled 2019-01-06: qty 250

## 2019-01-06 MED ORDER — SODIUM CHLORIDE 0.9 % IV SOLN
10.0000 mg/kg | Freq: Once | INTRAVENOUS | Status: AC
  Administered 2019-01-06: 09:00:00 960 mg via INTRAVENOUS
  Filled 2019-01-06: qty 10

## 2019-01-06 MED ORDER — SODIUM CHLORIDE 0.9% FLUSH
10.0000 mL | Freq: Once | INTRAVENOUS | Status: AC
  Administered 2019-01-06: 08:00:00 10 mL via INTRAVENOUS
  Filled 2019-01-06: qty 10

## 2019-01-06 NOTE — Progress Notes (Signed)
Patient here today for follow up.  Pateint states no new concerns today

## 2019-01-06 NOTE — Progress Notes (Signed)
Hematology/Oncology Follow up note [facility] [phone] Fax:([phone]   Patient Care Team: [name], MD as PCP - General (Internal Medicine) [name], RN as Registered Nurse   REASON FOR VISIT Follow up for immunotherapy treatment for stage IIIA lung adenocarcinoma  Oncology History:  Mark Mccarty is a  68 y.o.  male with  adenocarcinoma of lung, diagnosed via biopsy of right hilar lymph nodes.  Former 43 pack year smoking history.  Presents for assessment prior to concurrent chemo T3 N1 M0  INTERVAL HISTORY Mark Mccarty is a 68 y.o. male who has above history reviewed by me presents for evaluation prior to immunotherapy with durvalumab for treatment of stage III A non-small cell lung cancer. #Patient has been doing satisfactorily.  Denies any immunotherapy related side effects. Left arm pressure feeling has resolved.  No additional episodes. Denies any shortness of breath, chest pain, abdominal pain, cough or wheezing. Chronic hypokalemia, takes potassium chloride 40 mEq daily . [name] Review of Systems  Constitutional: Negative for appetite change, chills, fatigue, fever and unexpected weight change.  [name]:   Negative for hearing loss and voice change.   Eyes: Negative for eye problems and icterus.  Respiratory: Negative for chest tightness, cough and shortness of breath.   Cardiovascular: Negative for chest pain and leg swelling.  Gastrointestinal: Negative for abdominal distention and abdominal pain.  Endocrine: Negative for hot flashes.  Genitourinary: Negative for difficulty urinating, dysuria and frequency.   Musculoskeletal: Negative for arthralgias.  Skin: Negative for itching and rash.  Neurological: Negative for light-headedness and numbness.  [name]: Negative for adenopathy. Does not bruise/bleed easily.  Psychiatric/Behavioral: Negative for confusion.     MEDICAL HISTORY:  Past Medical History:    Diagnosis Date   Asthma    Cancer ([place])    lung   Cataract    left cataract surgery   Dyspnea    [name]    GERD (gastroesophageal reflux disease)    Hypertension    Neuromuscular disorder ([place])    pt has knees bilateral with tendon issues that cause pain    SURGICAL HISTORY: Past Surgical History:  Procedure Laterality Date   CARDIAC CATHETERIZATION N/A 12/16/2014   Procedure: Left Heart Cath;  Surgeon: [name], MD;  Location: [place] [facility];  Service: Cardiovascular;  Laterality: N/A;   CYST REMOVAL TRUNK     chest and back over time and it was removed   ENDOBRONCHIAL ULTRASOUND N/A 12/09/2017   Procedure: ENDOBRONCHIAL ULTRASOUND;  Surgeon: [name], MD;  Location: [name];  Service: Pulmonary;  Laterality: N/A;   left cataract surgery     PORTA CATH INSERTION N/A 12/23/2017   Procedure: PORTA CATH INSERTION;  Surgeon: [name], MD;  Location: [place] [facility];  Service: Cardiovascular;  Laterality: N/A;    SOCIAL HISTORY: Social History   Socioeconomic History   Marital status: Divorced    Spouse name: Not on file   Number of children: Not on file   Years of education: Not on file   Highest education level: Not on file  Occupational History   Not on file  Social Needs   Financial resource strain: Not on file   Food insecurity    Worry: Not on file    Inability: Not on file   Transportation needs    Medical: Not on file    Non-medical: Not on file  Tobacco Use   Smoking status: Former Smoker    Packs/day: 1.50  Years: 35.00    Pack years: 52.50    Types: Cigarettes    Quit date: 06/22/2015    Years since quitting: 3.5   Smokeless tobacco: Never Used  Substance and Sexual Activity   Alcohol use: Yes    Comment: a fifth in a week   Drug use: No   Sexual activity: Not Currently  Lifestyle   Physical activity    Days per week: Not on file    Minutes per session: Not on file    Stress: Not on file  Relationships   Social connections    Talks on phone: Not on file    Gets together: Not on file    Attends religious service: Not on file    Active member of club or organization: Not on file    Attends meetings of clubs or organizations: Not on file    Relationship status: Not on file   Intimate partner violence    Fear of current or ex partner: Not on file    Emotionally abused: Not on file    Physically abused: Not on file    Forced sexual activity: Not on file  Other Topics Concern   Not on file  Social History Narrative   Not on file    FAMILY HISTORY: Family History  Problem Relation Age of Onset   Hypertension Mother    Breast cancer Mother    Heart attack Mother    Lung cancer Father    Heart disease Sister    Diabetes Sister    Colon cancer Sister     ALLERGIES:  is allergic to penicillins.  MEDICATIONS:  Current Outpatient Medications  Medication Sig Dispense Refill   acetaminophen (TYLENOL) 500 MG tablet Take 500 mg by mouth as needed.     albuterol (PROVENTIL HFA;VENTOLIN HFA) 108 (90 Base) MCG/ACT inhaler Inhale 2 puffs into the lungs every 6 (six) hours as needed.  12   amLODipine (NORVASC) 10 MG tablet Take 10 mg by mouth daily.     ANORO ELLIPTA 62.5-25 MCG/INH AEPB TAKE 1 PUFF BY MOUTH EVERY DAY 180 each 3   aspirin 81 MG tablet Take 81 mg by mouth daily.     carvedilol (COREG) 25 MG tablet Take 25 mg by mouth 2 (two) times daily with a meal.     fluticasone (FLONASE) 50 MCG/ACT nasal spray PLACE 1 SPRAY INTO BOTH NOSTRILS 2 (TWO) TIMES DAILY  0   gabapentin (NEURONTIN) 300 MG capsule Take 300 mg by mouth at bedtime as needed.      ibuprofen (ADVIL,MOTRIN) 600 MG tablet Take 600 mg by mouth every 6 (six) hours as needed.     KLOR-CON M20 20 MEQ tablet TAKE 2 TABLETS BY MOUTH DAILY 60 tablet 2   lidocaine-prilocaine (EMLA) cream Apply to port area once 30 g 3   lisinopril (PRINIVIL,ZESTRIL) 40 MG tablet Take  40 mg by mouth daily.     pravastatin (PRAVACHOL) 20 MG tablet Take 20 mg by mouth at bedtime.     prochlorperazine (COMPAZINE) 10 MG tablet Take 1 tablet (10 mg total) by mouth every 6 (six) hours as needed (Nausea or vomiting). 30 tablet 1   senna (SENOKOT) 8.6 MG TABS tablet Take 2 tablets (17.2 mg total) by mouth daily. 120 each 0   tamsulosin (FLOMAX) 0.4 MG CAPS capsule Take 0.4 mg by mouth daily.     No current facility-administered medications for this visit.    Facility-Administered Medications Ordered in Other Visits  Medication Dose Route Frequency Provider Last Rate Last Dose   heparin lock flush 100 unit/mL  500 Units Intravenous Once [name], MD       sodium chloride flush (NS) 0.9 % injection 10 mL  10 mL Intravenous PRN [name], MD   10 mL at 03/18/18 0858     PHYSICAL EXAMINATION: ECOG PERFORMANCE STATUS: 0 - Asymptomatic Vitals:   01/06/19 0841  BP: (!) 147/85  Pulse: 72  Resp: 18  Temp: (!) 96.9 F (36.1 C)   Filed Weights   01/06/19 0841  Weight: 222 lb 3 oz (100.8 kg)    Physical Exam Constitutional:      General: He is not in acute distress. [name]:     Head: Normocephalic and atraumatic.  Eyes:     General: No scleral icterus.    Conjunctiva/sclera: Conjunctivae normal.     Pupils: Pupils are equal, round, and reactive to light.  Neck:     Musculoskeletal: Normal range of motion and neck supple.  Cardiovascular:     Rate and Rhythm: Normal rate and regular rhythm.     Heart sounds: Normal heart sounds.  Pulmonary:     Effort: Pulmonary effort is normal. No respiratory distress.     Breath sounds: No wheezing or rales.     Comments: Decreased breath sound bilaterally Chest:     Chest wall: No tenderness.  Abdominal:     General: Bowel sounds are normal. There is no distension.     Palpations: Abdomen is soft. There is no mass.     Tenderness: There is no abdominal tenderness.  Musculoskeletal: Normal range of motion.        General:  No deformity.  Lymphadenopathy:     Cervical: No cervical adenopathy.  Skin:    General: Skin is warm and dry.     Findings: No erythema or rash.     Comments: Chest wall Mediport site no erythema or discharge.  Neurological:     Mental Status: He is alert and oriented to person, place, and time.     Cranial Nerves: No cranial nerve deficit.     Coordination: Coordination normal.  Psychiatric:        Behavior: Behavior normal.        Thought Content: Thought content normal.      LABORATORY DATA:  I have reviewed the data as listed Lab Results  Component Value Date   WBC 5.0 01/06/2019   HGB 13.6 01/06/2019   HCT 39.7 01/06/2019   MCV 84.1 01/06/2019   PLT 233 01/06/2019   Recent Labs    12/09/18 0819 12/23/18 0833 01/06/19 0814  NA 138 138 139  K 3.7 3.3* 3.6  CL 104 101 103  CO2 25 28 26   GLUCOSE 130* 122* 124*  BUN 13 10 11   CREATININE 0.71 0.85 0.78  CALCIUM 9.1 8.9 8.7*  GFRNONAA >60 >60 >60  GFRAA >60 >60 >60  PROT 7.5 7.3 7.3  ALBUMIN 4.1 4.1 4.1  AST 24 27 25   ALT 21 28 21   ALKPHOS 64 59 55  BILITOT 0.7 0.8 0.7   RADIOGRAPHIC STUDIES: I have personally reviewed the radiological images as listed and agreed with the findings in the report..  03/21/2018 CT chest showed mild decreasing size of the right upper lobe pulmonary nodule.  Interval decrease in size of right paratracheal and right hilar adenopathy.  06/30/2018 CT chest showed interval development of extensive changes secondary to external beam radiation within the posterior  right upper lobe and superior segment of right lower lobe.  The enlarged right paratracheal lymph node is decreased in size in the interval.  Right upper lobe lung lesion is difficult to separate from surrounding changes due to radiation but is favored to have decrease in size in the interval.  Aortic atherosclerosis and emphysema.  LAD coronary artery atherosclerotic calcification.  08/06/2018 CT head without contrast Brain  parenchymal appears unremarkable there is no mass or hemorrhage.    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 Continued further decrease in the size of previously identified right.  Tracheal lymph node. Aortic atherosclerosis.  Emphysema.   ASSESSMENT & PLAN:  1. Primary lung adenocarcinoma, right ([place])   2. Hypokalemia   3. Encounter for antineoplastic immunotherapy   4. Port-A-Cath in place    Stage IIIa T3 N1 M0 lung adenocarcinoma, s/p concurrent chemoradiation.  Currently on immunotherapy durvalumab. Patient clinically doing well.  No significant immunotherapy related toxicities. Obtain surveillance CT scan prior to next visit.  CT chest without contrast was ordered.  Counts acceptable to proceed with cycle 21 durvalumab treatments today. TSH has been monitored monthly and has been stable.  Chronic hypokalemia, potassium level 3.6 today.  Continue potassium chloride 40 mEq daily.  Continue to monitor.  We spent sufficient time to discuss many aspect of care, questions were answered to patient's satisfaction.   Return visit, 2 weeks for evaluation prior to next durvalumab treatment.   We spent sufficient time to discuss many aspect of care, questions were answered to patient's satisfaction. Total face to face encounter time for this patient visit was 25 min. >50% of the time was  spent in counseling and coordination of care.   [name], MD, PhD

## 2019-01-16 ENCOUNTER — Other Ambulatory Visit: Payer: Self-pay

## 2019-01-16 ENCOUNTER — Ambulatory Visit
Admission: RE | Admit: 2019-01-16 | Discharge: 2019-01-16 | Disposition: A | Discharge status: Home / Self Care | Payer: Medicare Other | Source: Ambulatory Visit | Attending: Oncology | Admitting: Oncology

## 2019-01-16 DIAGNOSIS — C3491 Malignant neoplasm of unspecified part of right bronchus or lung: Secondary | ICD-10-CM

## 2019-01-19 ENCOUNTER — Other Ambulatory Visit: Payer: Self-pay

## 2019-01-20 ENCOUNTER — Encounter: Payer: Self-pay | Admitting: Oncology

## 2019-01-20 ENCOUNTER — Other Ambulatory Visit: Payer: Self-pay

## 2019-01-20 ENCOUNTER — Inpatient Hospital Stay (HOSPITAL_BASED_OUTPATIENT_CLINIC_OR_DEPARTMENT_OTHER): Payer: Medicare Other | Admitting: Oncology

## 2019-01-20 ENCOUNTER — Ambulatory Visit
Admission: RE | Admit: 2019-01-20 | Discharge: 2019-01-20 | Disposition: A | Discharge status: Home / Self Care | Payer: Medicare Other | Source: Ambulatory Visit | Attending: Radiation Oncology | Admitting: Radiation Oncology

## 2019-01-20 ENCOUNTER — Encounter: Payer: Self-pay | Admitting: Radiation Oncology

## 2019-01-20 ENCOUNTER — Inpatient Hospital Stay: Payer: Medicare Other

## 2019-01-20 VITALS — BP 140/85 | HR 72 | Temp 97.9°F | Wt 222.0 lb

## 2019-01-20 VITALS — BP 148/83 | HR 69 | Temp 97.0°F | Resp 18

## 2019-01-20 VITALS — BP 141/77 | HR 74 | Temp 98.4°F | Resp 18 | Wt 224.1 lb

## 2019-01-20 DIAGNOSIS — Z923 Personal history of irradiation: Secondary | ICD-10-CM | POA: Diagnosis not present

## 2019-01-20 DIAGNOSIS — C3491 Malignant neoplasm of unspecified part of right bronchus or lung: Secondary | ICD-10-CM

## 2019-01-20 DIAGNOSIS — Z86010 Personal history of colonic polyps: Secondary | ICD-10-CM | POA: Insufficient documentation

## 2019-01-20 DIAGNOSIS — R222 Localized swelling, mass and lump, trunk: Secondary | ICD-10-CM | POA: Diagnosis not present

## 2019-01-20 DIAGNOSIS — E876 Hypokalemia: Secondary | ICD-10-CM

## 2019-01-20 DIAGNOSIS — Z95828 Presence of other vascular implants and grafts: Secondary | ICD-10-CM

## 2019-01-20 DIAGNOSIS — Z5112 Encounter for antineoplastic immunotherapy: Secondary | ICD-10-CM

## 2019-01-20 DIAGNOSIS — Z87891 Personal history of nicotine dependence: Secondary | ICD-10-CM | POA: Diagnosis not present

## 2019-01-20 DIAGNOSIS — J701 Chronic and other pulmonary manifestations due to radiation: Secondary | ICD-10-CM | POA: Insufficient documentation

## 2019-01-20 LAB — CBC WITH DIFFERENTIAL/PLATELET
Abs Immature Granulocytes: 0.01 10*3/uL (ref 0.00–0.07)
Basophils Absolute: 0 10*3/uL (ref 0.0–0.1)
Basophils Relative: 1 %
Eosinophils Absolute: 0.1 10*3/uL (ref 0.0–0.5)
Eosinophils Relative: 2 %
HCT: 39.6 % (ref 39.0–52.0)
Hemoglobin: 13.6 g/dL (ref 13.0–17.0)
Immature Granulocytes: 0 %
Lymphocytes Relative: 11 %
Lymphs Abs: 0.7 10*3/uL (ref 0.7–4.0)
MCH: 29.2 pg (ref 26.0–34.0)
MCHC: 34.3 g/dL (ref 30.0–36.0)
MCV: 85 fL (ref 80.0–100.0)
Monocytes Absolute: 0.9 10*3/uL (ref 0.1–1.0)
Monocytes Relative: 14 %
Neutro Abs: 4.4 10*3/uL (ref 1.7–7.7)
Neutrophils Relative %: 72 %
Platelets: 233 10*3/uL (ref 150–400)
RBC: 4.66 MIL/uL (ref 4.22–5.81)
RDW: 14 % (ref 11.5–15.5)
WBC: 6.1 10*3/uL (ref 4.0–10.5)
nRBC: 0 % (ref 0.0–0.2)

## 2019-01-20 LAB — COMPREHENSIVE METABOLIC PANEL
ALT: 23 U/L (ref 0–44)
AST: 24 U/L (ref 15–41)
Albumin: 4.1 g/dL (ref 3.5–5.0)
Alkaline Phosphatase: 49 U/L (ref 38–126)
Anion gap: 9 (ref 5–15)
BUN: 13 mg/dL (ref 8–23)
CO2: 26 mmol/L (ref 22–32)
Calcium: 9 mg/dL (ref 8.9–10.3)
Chloride: 104 mmol/L (ref 98–111)
Creatinine, Ser: 0.89 mg/dL (ref 0.61–1.24)
GFR calc Af Amer: >60 mL/min (ref >60)
GFR calc non Af Amer: >60 mL/min (ref >60)
Glucose, Bld: 117 mg/dL — ABNORMAL HIGH (ref 70–99)
Potassium: 3.8 mmol/L (ref 3.5–5.1)
Sodium: 139 mmol/L (ref 135–145)
Total Bilirubin: 0.6 mg/dL (ref 0.3–1.2)
Total Protein: 7.4 g/dL (ref 6.5–8.1)

## 2019-01-20 LAB — TSH: TSH: 1.981 u[IU]/mL (ref 0.350–4.500)

## 2019-01-20 MED ORDER — HEPARIN SOD (PORK) LOCK FLUSH 100 UNIT/ML IV SOLN
500.0000 [IU] | Freq: Once | INTRAVENOUS | Status: AC | PRN
  Administered 2019-01-20: 11:00:00 500 [IU]
  Filled 2019-01-20: qty 5

## 2019-01-20 MED ORDER — SODIUM CHLORIDE 0.9 % IV SOLN
10.0000 mg/kg | Freq: Once | INTRAVENOUS | Status: AC
  Administered 2019-01-20: 10:00:00 960 mg via INTRAVENOUS
  Filled 2019-01-20: qty 19.2

## 2019-01-20 MED ORDER — SODIUM CHLORIDE 0.9% FLUSH
10.0000 mL | Freq: Once | INTRAVENOUS | Status: AC
  Administered 2019-01-20: 10 mL via INTRAVENOUS
  Filled 2019-01-20: qty 10

## 2019-01-20 MED ORDER — SODIUM CHLORIDE 0.9 % IV SOLN
Freq: Once | INTRAVENOUS | Status: AC
  Administered 2019-01-20: 09:00:00 via INTRAVENOUS
  Filled 2019-01-20: qty 250

## 2019-01-20 NOTE — Progress Notes (Signed)
Hematology/Oncology Follow up note [facility] Telephone:(336) [phone] Fax:(336) [phone]   Patient Care Team: [name], MD as PCP - General (Internal Medicine) [name], RN as Registered Nurse   REASON FOR VISIT Follow up for immunotherapy treatment for stage IIIA lung adenocarcinoma  Oncology History:  Mark Mccarty is a  68 y.o.  male with  adenocarcinoma of lung, diagnosed via biopsy of right hilar lymph nodes.  Former 43 pack year smoking history.  Presents for assessment prior to concurrent chemo T3 N1 M0  INTERVAL HISTORY Mark Mccarty is a 68 y.o. male who has above history reviewed by me presents for evaluation prior to immunotherapy with durvalumab for treatment of stage III A non-small cell lung cancer. Patient reports doing well at baseline. His nephew was found dead on the bed yesterday morning.  So he feels upset about the news.  Other than that no new complaints. He is taking potassium chloride 40 mEq daily. Denies any fever, chills, diarrhea, chest pain, abdominal pain cough or wheezing.  . . Review of Systems  Constitutional: Negative for appetite change, chills, fatigue, fever and unexpected weight change.  [name]:   Negative for hearing loss and voice change.   Eyes: Negative for eye problems and icterus.  Respiratory: Negative for chest tightness, cough and shortness of breath.   Cardiovascular: Negative for chest pain and leg swelling.  Gastrointestinal: Negative for abdominal distention and abdominal pain.  Endocrine: Negative for hot flashes.  Genitourinary: Negative for difficulty urinating, dysuria and frequency.   Musculoskeletal: Negative for arthralgias.  Skin: Negative for itching and rash.  Neurological: Negative for light-headedness and numbness.  [name]: Negative for adenopathy. Does not bruise/bleed easily.  Psychiatric/Behavioral: Negative for confusion.     MEDICAL HISTORY:  Past Medical  History:  Diagnosis Date  . Asthma   . Cancer ([place])    lung  . Cataract    left cataract surgery  . Dyspnea   . [name]   . GERD (gastroesophageal reflux disease)   . Hypertension   . Neuromuscular disorder ([place])    pt has knees bilateral with tendon issues that cause pain    SURGICAL HISTORY: Past Surgical History:  Procedure Laterality Date  . CARDIAC CATHETERIZATION N/A 12/16/2014   Procedure: Left Heart Cath;  Surgeon: [name], MD;  Location: [place] [facility];  Service: Cardiovascular;  Laterality: N/A;  . CYST REMOVAL TRUNK     chest and back over time and it was removed  . ENDOBRONCHIAL ULTRASOUND N/A 12/09/2017   Procedure: ENDOBRONCHIAL ULTRASOUND;  Surgeon: [name], MD;  Location: [name];  Service: Pulmonary;  Laterality: N/A;  . left cataract surgery    . PORTA CATH INSERTION N/A 12/23/2017   Procedure: PORTA CATH INSERTION;  Surgeon: [name], MD;  Location: [place] [facility];  Service: Cardiovascular;  Laterality: N/A;    SOCIAL HISTORY: Social History   Socioeconomic History  . Marital status: Divorced    Spouse name: Not on file  . Number of children: Not on file  . Years of education: Not on file  . Highest education level: Not on file  Occupational History  . Not on file  Social Needs  . Financial resource strain: Not on file  . Food insecurity    Worry: Not on file    Inability: Not on file  . Transportation needs    Medical: Not on file    Non-medical: Not on file  Tobacco Use  .  Smoking status: Former Smoker    Packs/day: 1.50    Years: 35.00    Pack years: 52.50    Types: Cigarettes    Quit date: 06/22/2015    Years since quitting: 3.5  . Smokeless tobacco: Never Used  Substance and Sexual Activity  . Alcohol use: Yes    Comment: a fifth in a week  . Drug use: No  . Sexual activity: Not Currently  Lifestyle  . Physical activity    Days per week: Not on file    Minutes per session: Not on file   . Stress: Not on file  Relationships  . Social Herbalist on phone: Not on file    Gets together: Not on file    Attends religious service: Not on file    Active member of club or organization: Not on file    Attends meetings of clubs or organizations: Not on file    Relationship status: Not on file  . Intimate partner violence    Fear of current or ex partner: Not on file    Emotionally abused: Not on file    Physically abused: Not on file    Forced sexual activity: Not on file  Other Topics Concern  . Not on file  Social History Narrative  . Not on file    FAMILY HISTORY: Family History  Problem Relation Age of Onset  . Hypertension Mother   . Breast cancer Mother   . Heart attack Mother   . Lung cancer Father   . Heart disease Sister   . Diabetes Sister   . Colon cancer Sister     ALLERGIES:  is allergic to penicillins.  MEDICATIONS:  Current Outpatient Medications  Medication Sig Dispense Refill  . acetaminophen (TYLENOL) 500 MG tablet Take 500 mg by mouth as needed.    [name] albuterol (PROVENTIL HFA;VENTOLIN HFA) 108 (90 Base) MCG/ACT inhaler Inhale 2 puffs into the lungs every 6 (six) hours as needed.  12  . amLODipine (NORVASC) 10 MG tablet Take 10 mg by mouth daily.    Jearl Klinefelter ELLIPTA 62.5-25 MCG/INH AEPB TAKE 1 PUFF BY MOUTH EVERY DAY 180 each 3  . aspirin 81 MG tablet Take 81 mg by mouth daily.    . carvedilol (COREG) 25 MG tablet Take 25 mg by mouth 2 (two) times daily with a meal.    . fluticasone (FLONASE) 50 MCG/ACT nasal spray PLACE 1 SPRAY INTO BOTH NOSTRILS 2 (TWO) TIMES DAILY  0  . gabapentin (NEURONTIN) 300 MG capsule Take 300 mg by mouth at bedtime as needed.     [name] ibuprofen (ADVIL,MOTRIN) 600 MG tablet Take 600 mg by mouth every 6 (six) hours as needed.    [name] KLOR-CON M20 20 MEQ tablet TAKE 2 TABLETS BY MOUTH DAILY 60 tablet 2  . lidocaine-prilocaine (EMLA) cream Apply to port area once 30 g 3  . lisinopril (PRINIVIL,ZESTRIL) 40 MG tablet  Take 40 mg by mouth daily.    . pravastatin (PRAVACHOL) 20 MG tablet Take 20 mg by mouth at bedtime.    . prochlorperazine (COMPAZINE) 10 MG tablet Take 1 tablet (10 mg total) by mouth every 6 (six) hours as needed (Nausea or vomiting). 30 tablet 1  . senna (SENOKOT) 8.6 MG TABS tablet Take 2 tablets (17.2 mg total) by mouth daily. 120 each 0  . tamsulosin (FLOMAX) 0.4 MG CAPS capsule Take 0.4 mg by mouth daily.     No current facility-administered medications for  this visit.    Facility-Administered Medications Ordered in Other Visits  Medication Dose Route Frequency Provider Last Rate Last Dose  . sodium chloride flush (NS) 0.9 % injection 10 mL  10 mL Intravenous PRN [name], MD   10 mL at 03/18/18 0858     PHYSICAL EXAMINATION: ECOG PERFORMANCE STATUS: 0 - Asymptomatic Vitals:   01/20/19 0858  BP: 140/85  Pulse: 72  Temp: 97.9 F (36.6 C)   Filed Weights   01/20/19 0858  Weight: 222 lb (100.7 kg)    Physical Exam Constitutional:      General: He is not in acute distress. [name]:     Head: Normocephalic and atraumatic.  Eyes:     General: No scleral icterus.    Conjunctiva/sclera: Conjunctivae normal.     Pupils: Pupils are equal, round, and reactive to light.  Neck:     Musculoskeletal: Normal range of motion and neck supple.  Cardiovascular:     Rate and Rhythm: Normal rate and regular rhythm.     Heart sounds: Normal heart sounds.  Pulmonary:     Effort: Pulmonary effort is normal. No respiratory distress.     Breath sounds: No wheezing or rales.     Comments: Decreased breath sound bilaterally Chest:     Chest wall: No tenderness.  Abdominal:     General: Bowel sounds are normal. There is no distension.     Palpations: Abdomen is soft. There is no mass.     Tenderness: There is no abdominal tenderness.  Musculoskeletal: Normal range of motion.        General: No deformity.  Lymphadenopathy:     Cervical: No cervical adenopathy.  Skin:    General: Skin is  warm and dry.     Findings: No erythema or rash.     Comments: Chest wall Mediport site no erythema or discharge. Left supraclavicular fossa fullness, chronic.  Neurological:     Mental Status: He is alert and oriented to person, place, and time.     Cranial Nerves: No cranial nerve deficit.     Coordination: Coordination normal.  Psychiatric:        Behavior: Behavior normal.        Thought Content: Thought content normal.      LABORATORY DATA:  I have reviewed the data as listed Lab Results  Component Value Date   WBC 6.1 01/20/2019   HGB 13.6 01/20/2019   HCT 39.6 01/20/2019   MCV 85.0 01/20/2019   PLT 233 01/20/2019   Recent Labs    12/23/18 0833 01/06/19 0814 01/20/19 0820  NA 138 139 139  K 3.3* 3.6 3.8  CL 101 103 104  CO2 28 26 26   GLUCOSE 122* 124* 117*  BUN 10 11 13   CREATININE 0.85 0.78 0.89  CALCIUM 8.9 8.7* 9.0  GFRNONAA >60 >60 >60  GFRAA >60 >60 >60  PROT 7.3 7.3 7.4  ALBUMIN 4.1 4.1 4.1  AST 27 25 24   ALT 28 21 23   ALKPHOS 59 55 49  BILITOT 0.8 0.7 0.6   RADIOGRAPHIC STUDIES: I have personally reviewed the radiological images as listed and agreed with the findings in the report..  03/21/2018 CT chest showed mild decreasing size of the right upper lobe pulmonary nodule.  Interval decrease in size of right paratracheal and right hilar adenopathy.  06/30/2018 CT chest showed interval development of extensive changes secondary to external beam radiation within the posterior right upper lobe and superior segment of right lower  lobe.  The enlarged right paratracheal lymph node is decreased in size in the interval.  Right upper lobe lung lesion is difficult to separate from surrounding changes due to radiation but is favored to have decrease in size in the interval.  Aortic atherosclerosis and emphysema.  LAD coronary artery atherosclerotic calcification.  08/06/2018 CT head without contrast Brain parenchymal appears unremarkable there is no mass or  hemorrhage.    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 Continued further decrease in the size of previously identified right.  Tracheal lymph node. Aortic atherosclerosis.  Emphysema.   ASSESSMENT & PLAN:  1. Primary lung adenocarcinoma, right ([place])   2. Hypokalemia   3. Encounter for antineoplastic immunotherapy   4. Supraclavicular fossa fullness    Stage IIIa T3 N1 M0 lung adenocarcinoma, s/p concurrent chemoradiation.  Currently on immunotherapy durvalumab. Patient has tolerated immunotherapy pretty well.  Today is cycle 22. No significant immunotherapy related toxicities Interval surveillance CT chest without contrast images were independently reviewed by me and discussed with patient. No disease progression.  Stable  Continue monitor.  Chronic hypokalemia, potassium level 3.8 today.  Continue potassium chloride 40 mEq daily.  Continue tomonitor.  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  Previous work-up negative for DVT.  Continue monitor . We spent sufficient time to discuss many aspect of care, questions were answered to patient's satisfaction.   Return visit, 2 weeks for evaluation prior to next durvalumab treatment.    [name], MD, PhD

## 2019-01-20 NOTE — Progress Notes (Signed)
Radiation Oncology Follow up Note  Name: Mark Mccarty   Date:   01/20/2019 MRN:  [id number] DOB: 1951/03/31    This 68 y.o. male presents to the clinic today for 1 year follow-up status post concurrent chemoradiation therapy for stage III adenocarcinoma the right lung.  REFERRING PROVIDER: [name], MD  HPI: Patient is a 68 year old male now at 1 year having completed concurrent chemoradiation for stage IIIa (T3 N1 M0) adenocarcinoma the right lung.  Seen today in routine follow-up he is doing well.  Specifically denies cough hemoptysis or chest tightness.  Recently had a CT scan.  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 HEENT reveals PERLA, EOMI, discs not visualized.  Oral cavity is clear. No oral mucosal lesions are identified. Neck is clear without evidence of cervical or supraclavicular adenopathy. Lungs are clear to A&P. Cardiac examination is essentially unremarkable with regular rate and rhythm without murmur rub or thrill. Abdomen is benign with no organomegaly or masses noted. Motor sensory and DTR levels are equal and symmetric in the upper and lower extremities. Cranial nerves II through XII are grossly intact. Proprioception is intact. No peripheral adenopathy or edema is identified. No motor or sensory levels are noted. Crude visual fields are within normal range.  RADIOLOGY RESULTS: CT scans reviewed compatible with above-stated findings  PLAN: Present time patient is doing well with no evidence of disease.  I am pleased with his  overall progress.  Of asked to see him back in 1 year for follow-up.  Patient continues immunotherapy under medical oncology's direction.  Patient knows to call with any concerns at any time.  I would like to take this opportunity to thank you for allowing me to participate in the care of your patient.[name], MD

## 2019-01-29 ENCOUNTER — Other Ambulatory Visit: Payer: Self-pay | Admitting: Oncology

## 2019-01-29 NOTE — Telephone Encounter (Signed)
   Ref Range & Units 9d ago 3wk ago 66mo ago  Potassium 3.5 - 5.1 mmol/L 3.8  3.6  3.3Low

## 2019-02-03 ENCOUNTER — Inpatient Hospital Stay (HOSPITAL_BASED_OUTPATIENT_CLINIC_OR_DEPARTMENT_OTHER): Payer: Medicare Other | Admitting: Oncology

## 2019-02-03 ENCOUNTER — Inpatient Hospital Stay: Payer: Medicare Other | Attending: Oncology

## 2019-02-03 ENCOUNTER — Other Ambulatory Visit: Payer: Self-pay

## 2019-02-03 ENCOUNTER — Encounter: Payer: Self-pay | Admitting: Oncology

## 2019-02-03 ENCOUNTER — Inpatient Hospital Stay: Payer: Medicare Other

## 2019-02-03 VITALS — BP 150/81 | HR 71 | Temp 97.2°F | Wt 222.8 lb

## 2019-02-03 DIAGNOSIS — J45909 Unspecified asthma, uncomplicated: Secondary | ICD-10-CM | POA: Diagnosis not present

## 2019-02-03 DIAGNOSIS — Z5112 Encounter for antineoplastic immunotherapy: Secondary | ICD-10-CM

## 2019-02-03 DIAGNOSIS — C3491 Malignant neoplasm of unspecified part of right bronchus or lung: Secondary | ICD-10-CM

## 2019-02-03 DIAGNOSIS — Z87891 Personal history of nicotine dependence: Secondary | ICD-10-CM | POA: Diagnosis not present

## 2019-02-03 DIAGNOSIS — Z791 Long term (current) use of non-steroidal anti-inflammatories (NSAID): Secondary | ICD-10-CM | POA: Diagnosis not present

## 2019-02-03 DIAGNOSIS — E876 Hypokalemia: Secondary | ICD-10-CM | POA: Insufficient documentation

## 2019-02-03 DIAGNOSIS — Z803 Family history of malignant neoplasm of breast: Secondary | ICD-10-CM | POA: Diagnosis not present

## 2019-02-03 DIAGNOSIS — Z79899 Other long term (current) drug therapy: Secondary | ICD-10-CM | POA: Insufficient documentation

## 2019-02-03 DIAGNOSIS — I10 Essential (primary) hypertension: Secondary | ICD-10-CM | POA: Diagnosis not present

## 2019-02-03 DIAGNOSIS — Z7982 Long term (current) use of aspirin: Secondary | ICD-10-CM | POA: Diagnosis not present

## 2019-02-03 DIAGNOSIS — Z95828 Presence of other vascular implants and grafts: Secondary | ICD-10-CM

## 2019-02-03 LAB — CBC WITH DIFFERENTIAL/PLATELET
Abs Immature Granulocytes: 0.02 10*3/uL (ref 0.00–0.07)
Basophils Absolute: 0 10*3/uL (ref 0.0–0.1)
Basophils Relative: 0 %
Eosinophils Absolute: 0.2 10*3/uL (ref 0.0–0.5)
Eosinophils Relative: 3 %
HCT: 39.7 % (ref 39.0–52.0)
Hemoglobin: 13.6 g/dL (ref 13.0–17.0)
Immature Granulocytes: 0 %
Lymphocytes Relative: 13 %
Lymphs Abs: 0.8 10*3/uL (ref 0.7–4.0)
MCH: 28.9 pg (ref 26.0–34.0)
MCHC: 34.3 g/dL (ref 30.0–36.0)
MCV: 84.3 fL (ref 80.0–100.0)
Monocytes Absolute: 0.7 10*3/uL (ref 0.1–1.0)
Monocytes Relative: 12 %
Neutro Abs: 4.4 10*3/uL (ref 1.7–7.7)
Neutrophils Relative %: 72 %
Platelets: 239 10*3/uL (ref 150–400)
RBC: 4.71 MIL/uL (ref 4.22–5.81)
RDW: 13.4 % (ref 11.5–15.5)
WBC: 6.2 10*3/uL (ref 4.0–10.5)
nRBC: 0 % (ref 0.0–0.2)

## 2019-02-03 LAB — COMPREHENSIVE METABOLIC PANEL
ALT: 22 U/L (ref 0–44)
AST: 23 U/L (ref 15–41)
Albumin: 4 g/dL (ref 3.5–5.0)
Alkaline Phosphatase: 57 U/L (ref 38–126)
Anion gap: 7 (ref 5–15)
BUN: 14 mg/dL (ref 8–23)
CO2: 27 mmol/L (ref 22–32)
Calcium: 8.9 mg/dL (ref 8.9–10.3)
Chloride: 105 mmol/L (ref 98–111)
Creatinine, Ser: 0.9 mg/dL (ref 0.61–1.24)
GFR calc Af Amer: >60 mL/min (ref >60)
GFR calc non Af Amer: >60 mL/min (ref >60)
Glucose, Bld: 117 mg/dL — ABNORMAL HIGH (ref 70–99)
Potassium: 3.9 mmol/L (ref 3.5–5.1)
Sodium: 139 mmol/L (ref 135–145)
Total Bilirubin: 0.5 mg/dL (ref 0.3–1.2)
Total Protein: 7.4 g/dL (ref 6.5–8.1)

## 2019-02-03 LAB — TSH: TSH: 2.193 u[IU]/mL (ref 0.350–4.500)

## 2019-02-03 MED ORDER — SODIUM CHLORIDE 0.9 % IV SOLN
960.0000 mg | Freq: Once | INTRAVENOUS | Status: AC
  Administered 2019-02-03: 960 mg via INTRAVENOUS
  Filled 2019-02-03: qty 19.2

## 2019-02-03 MED ORDER — SODIUM CHLORIDE 0.9% FLUSH
10.0000 mL | Freq: Once | INTRAVENOUS | Status: AC
  Administered 2019-02-03: 10 mL via INTRAVENOUS
  Filled 2019-02-03: qty 10

## 2019-02-03 MED ORDER — SODIUM CHLORIDE 0.9 % IV SOLN
Freq: Once | INTRAVENOUS | Status: AC
  Administered 2019-02-03: 10:00:00 via INTRAVENOUS
  Filled 2019-02-03: qty 250

## 2019-02-03 MED ORDER — SODIUM CHLORIDE 0.9 % IV SOLN: 1000.0000 mg | Freq: Once | INTRAVENOUS | Status: DC

## 2019-02-03 MED ORDER — HEPARIN SOD (PORK) LOCK FLUSH 100 UNIT/ML IV SOLN
500.0000 [IU] | Freq: Once | INTRAVENOUS | Status: AC | PRN
  Administered 2019-02-03: 500 [IU]

## 2019-02-03 NOTE — Progress Notes (Signed)
Hematology/Oncology Follow up note [facility] [phone] [phone]   Patient Care Team: [name], MD as PCP - General (Internal Medicine) [name], RN as Registered Nurse   REASON FOR VISIT Follow up for immunotherapy treatment for stage IIIA lung adenocarcinoma  Oncology History:  Mark Mccarty is a  68 y.o.  male with  adenocarcinoma of lung, diagnosed via biopsy of right hilar lymph nodes.  Former 43 pack year smoking history.  Presents for assessment prior to concurrent chemo T3 N1 M0  INTERVAL HISTORY Mark Mccarty is a 68 y.o. male who has above history reviewed by me presents for evaluation prior to immunotherapy with durvalumab for treatment of stage III A non-small cell lung cancer. Patient reports doing quite well at baseline. Appetite is fair. Taking potassium chloride 40 mEq daily. Denies any fever, chills, diarrhea, chest pain, abdominal pain, cough, or wheezing.  No skin rash.  Patient reports doing well at baseline. His nephew was found dead on the bed yesterday morning.  So he feels upset about the news.  Other than that no new complaints. He is taking potassium chloride 40 mEq daily. Denies any fever, chills, diarrhea, chest pain, abdominal pain cough or wheezing. No new complaints today. . . Review of Systems  Constitutional: Negative for appetite change, chills, fatigue, fever and unexpected weight change.  [name]:   Negative for hearing loss and voice change.   Eyes: Negative for eye problems and icterus.  Respiratory: Negative for chest tightness, cough and shortness of breath.   Cardiovascular: Negative for chest pain and leg swelling.  Gastrointestinal: Negative for abdominal distention and abdominal pain.  Endocrine: Negative for hot flashes.  Genitourinary: Negative for difficulty urinating, dysuria and frequency.   Musculoskeletal: Negative for arthralgias.  Skin: Negative for itching and  rash.  Neurological: Negative for light-headedness and numbness.  [name]: Negative for adenopathy. Does not bruise/bleed easily.  Psychiatric/Behavioral: Negative for confusion.     MEDICAL HISTORY:  Past Medical History:  Diagnosis Date   Asthma    Cancer ([place])    lung   Cataract    left cataract surgery   Dyspnea    [name]    GERD (gastroesophageal reflux disease)    Hypertension    Neuromuscular disorder ([place])    pt has knees bilateral with tendon issues that cause pain    SURGICAL HISTORY: Past Surgical History:  Procedure Laterality Date   CARDIAC CATHETERIZATION N/A 12/16/2014   Procedure: Left Heart Cath;  Surgeon: [name], MD;  Location: [facility];  Service: Cardiovascular;  Laterality: N/A;   CYST REMOVAL TRUNK     chest and back over time and it was removed   ENDOBRONCHIAL ULTRASOUND N/A 12/09/2017   Procedure: ENDOBRONCHIAL ULTRASOUND;  Surgeon: [name], MD;  Location: [name];  Service: Pulmonary;  Laterality: N/A;   left cataract surgery     PORTA CATH INSERTION N/A 12/23/2017   Procedure: PORTA CATH INSERTION;  Surgeon: [name], MD;  Location: [facility];  Service: Cardiovascular;  Laterality: N/A;    SOCIAL HISTORY: Social History   Socioeconomic History   Marital status: Divorced    Spouse name: Not on file   Number of children: Not on file   Years of education: Not on file   Highest education level: Not on file  Occupational History   Not on file  Social Needs   Financial resource strain: Not on file   Food insecurity  Worry: Not on file    Inability: Not on file   Transportation needs    Medical: Not on file    Non-medical: Not on file  Tobacco Use   Smoking status: Former Smoker    Packs/day: 1.50    Years: 35.00    Pack years: 52.50    Types: Cigarettes    Quit date: 06/22/2015    Years since quitting: 3.6   Smokeless tobacco: Never Used    Substance and Sexual Activity   Alcohol use: Yes    Comment: a fifth in a week   Drug use: No   Sexual activity: Not Currently  Lifestyle   Physical activity    Days per week: Not on file    Minutes per session: Not on file   Stress: Not on file  Relationships   Social connections    Talks on phone: Not on file    Gets together: Not on file    Attends religious service: Not on file    Active member of club or organization: Not on file    Attends meetings of clubs or organizations: Not on file    Relationship status: Not on file   Intimate partner violence    Fear of current or ex partner: Not on file    Emotionally abused: Not on file    Physically abused: Not on file    Forced sexual activity: Not on file  Other Topics Concern   Not on file  Social History Narrative   Not on file    FAMILY HISTORY: Family History  Problem Relation Age of Onset   Hypertension Mother    Breast cancer Mother    Heart attack Mother    Lung cancer Father    Heart disease Sister    Diabetes Sister    Colon cancer Sister     ALLERGIES:  is allergic to penicillins.  MEDICATIONS:  Current Outpatient Medications  Medication Sig Dispense Refill   acetaminophen (TYLENOL) 500 MG tablet Take 500 mg by mouth as needed.     albuterol (PROVENTIL HFA;VENTOLIN HFA) 108 (90 Base) MCG/ACT inhaler Inhale 2 puffs into the lungs every 6 (six) hours as needed.  12   amLODipine (NORVASC) 10 MG tablet Take 10 mg by mouth daily.     ANORO ELLIPTA 62.5-25 MCG/INH AEPB TAKE 1 PUFF BY MOUTH EVERY DAY 180 each 3   aspirin 81 MG tablet Take 81 mg by mouth daily.     carvedilol (COREG) 25 MG tablet Take 25 mg by mouth 2 (two) times daily with a meal.     fluticasone (FLONASE) 50 MCG/ACT nasal spray PLACE 1 SPRAY INTO BOTH NOSTRILS 2 (TWO) TIMES DAILY  0   gabapentin (NEURONTIN) 300 MG capsule Take 300 mg by mouth at bedtime as needed.      ibuprofen (ADVIL,MOTRIN) 600 MG tablet Take  600 mg by mouth every 6 (six) hours as needed.     KLOR-CON M20 20 MEQ tablet TAKE 2 TABLETS BY MOUTH DAILY 60 tablet 2   lidocaine-prilocaine (EMLA) cream Apply to port area once 30 g 3   lisinopril (PRINIVIL,ZESTRIL) 40 MG tablet Take 40 mg by mouth daily.     pravastatin (PRAVACHOL) 20 MG tablet Take 20 mg by mouth at bedtime.     prochlorperazine (COMPAZINE) 10 MG tablet Take 1 tablet (10 mg total) by mouth every 6 (six) hours as needed (Nausea or vomiting). 30 tablet 1   senna (SENOKOT) 8.6 MG TABS tablet  Take 2 tablets (17.2 mg total) by mouth daily. 120 each 0   tamsulosin (FLOMAX) 0.4 MG CAPS capsule Take 0.4 mg by mouth daily.     No current facility-administered medications for this visit.    Facility-Administered Medications Ordered in Other Visits  Medication Dose Route Frequency Provider Last Rate Last Dose   sodium chloride flush (NS) 0.9 % injection 10 mL  10 mL Intravenous PRN [name], MD   10 mL at 03/18/18 0858     PHYSICAL EXAMINATION: ECOG PERFORMANCE STATUS: 0 - Asymptomatic Vitals:   02/03/19 0848  BP: (!) 150/81  Pulse: 71  Temp: (!) 97.2 F (36.2 C)   Filed Weights   02/03/19 0848  Weight: 222 lb 12.8 oz (101.1 kg)    Physical Exam Constitutional:      General: He is not in acute distress. [name]:     Head: Normocephalic and atraumatic.  Eyes:     General: No scleral icterus.    Conjunctiva/sclera: Conjunctivae normal.     Pupils: Pupils are equal, round, and reactive to light.  Neck:     Musculoskeletal: Normal range of motion and neck supple.  Cardiovascular:     Rate and Rhythm: Normal rate and regular rhythm.     Heart sounds: Normal heart sounds.  Pulmonary:     Effort: Pulmonary effort is normal. No respiratory distress.     Breath sounds: No wheezing or rales.     Comments: Decreased breath sound bilaterally Chest:     Chest wall: No tenderness.  Abdominal:     General: Bowel sounds are normal. There is no distension.      Palpations: Abdomen is soft. There is no mass.     Tenderness: There is no abdominal tenderness.  Musculoskeletal: Normal range of motion.        General: No deformity.  Lymphadenopathy:     Cervical: No cervical adenopathy.  Skin:    General: Skin is warm and dry.     Findings: No erythema or rash.     Comments: Chest wall Mediport site no erythema or discharge. Left supraclavicular fossa fullness, chronic.  Neurological:     Mental Status: He is alert and oriented to person, place, and time.     Cranial Nerves: No cranial nerve deficit.     Coordination: Coordination normal.  Psychiatric:        Behavior: Behavior normal.        Thought Content: Thought content normal.      LABORATORY DATA:  I have reviewed the data as listed Lab Results  Component Value Date   WBC 6.2 02/03/2019   HGB 13.6 02/03/2019   HCT 39.7 02/03/2019   MCV 84.3 02/03/2019   PLT 239 02/03/2019   Recent Labs    01/06/19 0814 01/20/19 0820 02/03/19 0832  NA 139 139 139  K 3.6 3.8 3.9  CL 103 104 105  CO2 26 26 27   GLUCOSE 124* 117* 117*  BUN 11 13 14   CREATININE 0.78 0.89 0.90  CALCIUM 8.7* 9.0 8.9  GFRNONAA >60 >60 >60  GFRAA >60 >60 >60  PROT 7.3 7.4 7.4  ALBUMIN 4.1 4.1 4.0  AST 25 24 23   ALT 21 23 22   ALKPHOS 55 49 57  BILITOT 0.7 0.6 0.5   RADIOGRAPHIC STUDIES: I have personally reviewed the radiological images as listed and agreed with the findings in the report..  03/21/2018 CT chest showed mild decreasing size of the right upper lobe pulmonary nodule.  Interval decrease in size of right paratracheal and right hilar adenopathy.  06/30/2018 CT chest showed interval development of extensive changes secondary to external beam radiation within the posterior right upper lobe and superior segment of right lower lobe.  The enlarged right paratracheal lymph node is decreased in size in the interval.  Right upper lobe lung lesion is difficult to separate from surrounding changes due to  radiation but is favored to have decrease in size in the interval.  Aortic atherosclerosis and emphysema.  LAD coronary artery atherosclerotic calcification.  08/06/2018 CT head without contrast Brain parenchymal appears unremarkable there is no mass or hemorrhage.    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 Continued further decrease in the size of previously identified right.  Tracheal lymph node. Aortic atherosclerosis.  Emphysema.   ASSESSMENT & PLAN:  1. Primary lung adenocarcinoma, right ([place])   2. Encounter for antineoplastic immunotherapy   3. Hypokalemia    Stage IIIa T3 N1 M0 lung adenocarcinoma, s/p concurrent chemoradiation.  Currently on immunotherapy durvalumab. Overall patient tolerates immunotherapy well. Today is cycle 23 No significant immunotherapy related toxicities. Interval CT chest without contrast images were reviewed at the last visit.  Stable disease no progression. Continue to monitor. Labs are reviewed and discussed with patient. Counts acceptable to proceed with cycle 23 durvalumab treatment.  Chronic hypo-kalemia, he has been taking potassium chloride 40 mEq daily.  Potassium level is stable.  Continue supplements.  Continue monitor counts . 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  Previous work-up negative for DVT.  Continue monitor . We spent sufficient time to discuss many aspect of care, questions were answered to patient's satisfaction.   Return visit, 2 weeks for evaluation prior to next durvalumab treatment.    [name], MD, PhD

## 2019-02-17 ENCOUNTER — Inpatient Hospital Stay: Payer: Medicare Other

## 2019-02-17 ENCOUNTER — Other Ambulatory Visit: Payer: Self-pay

## 2019-02-17 ENCOUNTER — Encounter: Payer: Self-pay | Admitting: Oncology

## 2019-02-17 ENCOUNTER — Inpatient Hospital Stay (HOSPITAL_BASED_OUTPATIENT_CLINIC_OR_DEPARTMENT_OTHER): Payer: Medicare Other | Admitting: Oncology

## 2019-02-17 VITALS — BP 133/77 | HR 68 | Temp 97.0°F | Wt 223.5 lb

## 2019-02-17 DIAGNOSIS — C3491 Malignant neoplasm of unspecified part of right bronchus or lung: Secondary | ICD-10-CM

## 2019-02-17 DIAGNOSIS — E876 Hypokalemia: Secondary | ICD-10-CM | POA: Diagnosis not present

## 2019-02-17 DIAGNOSIS — Z5112 Encounter for antineoplastic immunotherapy: Secondary | ICD-10-CM | POA: Diagnosis not present

## 2019-02-17 LAB — CBC WITH DIFFERENTIAL/PLATELET
Abs Immature Granulocytes: 0.02 10*3/uL (ref 0.00–0.07)
Basophils Absolute: 0 10*3/uL (ref 0.0–0.1)
Basophils Relative: 1 %
Eosinophils Absolute: 0.2 10*3/uL (ref 0.0–0.5)
Eosinophils Relative: 4 %
HCT: 40 % (ref 39.0–52.0)
Hemoglobin: 13.6 g/dL (ref 13.0–17.0)
Immature Granulocytes: 0 %
Lymphocytes Relative: 13 %
Lymphs Abs: 0.8 10*3/uL (ref 0.7–4.0)
MCH: 28.6 pg (ref 26.0–34.0)
MCHC: 34 g/dL (ref 30.0–36.0)
MCV: 84.2 fL (ref 80.0–100.0)
Monocytes Absolute: 0.7 10*3/uL (ref 0.1–1.0)
Monocytes Relative: 11 %
Neutro Abs: 4.6 10*3/uL (ref 1.7–7.7)
Neutrophils Relative %: 71 %
Platelets: 272 10*3/uL (ref 150–400)
RBC: 4.75 MIL/uL (ref 4.22–5.81)
RDW: 13.5 % (ref 11.5–15.5)
WBC: 6.4 10*3/uL (ref 4.0–10.5)
nRBC: 0 % (ref 0.0–0.2)

## 2019-02-17 LAB — COMPREHENSIVE METABOLIC PANEL
ALT: 21 U/L (ref 0–44)
AST: 22 U/L (ref 15–41)
Albumin: 4 g/dL (ref 3.5–5.0)
Alkaline Phosphatase: 57 U/L (ref 38–126)
Anion gap: 8 (ref 5–15)
BUN: 13 mg/dL (ref 8–23)
CO2: 26 mmol/L (ref 22–32)
Calcium: 9 mg/dL (ref 8.9–10.3)
Chloride: 104 mmol/L (ref 98–111)
Creatinine, Ser: 0.83 mg/dL (ref 0.61–1.24)
GFR calc Af Amer: >60 mL/min (ref >60)
GFR calc non Af Amer: >60 mL/min (ref >60)
Glucose, Bld: 118 mg/dL — ABNORMAL HIGH (ref 70–99)
Potassium: 3.9 mmol/L (ref 3.5–5.1)
Sodium: 138 mmol/L (ref 135–145)
Total Bilirubin: 0.5 mg/dL (ref 0.3–1.2)
Total Protein: 7.6 g/dL (ref 6.5–8.1)

## 2019-02-17 MED ORDER — SODIUM CHLORIDE 0.9 % IV SOLN
Freq: Once | INTRAVENOUS | Status: AC
  Administered 2019-02-17: 10:00:00 via INTRAVENOUS
  Filled 2019-02-17: qty 250

## 2019-02-17 MED ORDER — HEPARIN SOD (PORK) LOCK FLUSH 100 UNIT/ML IV SOLN
500.0000 [IU] | Freq: Once | INTRAVENOUS | Status: AC | PRN
  Administered 2019-02-17: 500 [IU]
  Filled 2019-02-17: qty 5

## 2019-02-17 MED ORDER — SODIUM CHLORIDE 0.9 % IV SOLN
10.0000 mg/kg | Freq: Once | INTRAVENOUS | Status: AC
  Administered 2019-02-17: 960 mg via INTRAVENOUS
  Filled 2019-02-17: qty 19.2

## 2019-02-17 NOTE — Progress Notes (Signed)
Hematology/Oncology Follow up note [facility] Telephone:([phone] Fax:(336) [phone]   Patient Care Team: [name], MD as PCP - General (Internal Medicine) [name], RN as Registered Nurse   REASON FOR VISIT Follow up for immunotherapy treatment for stage IIIA lung adenocarcinoma  Oncology History:  Mark Mccarty is a  68 y.o.  male with  adenocarcinoma of lung, diagnosed via biopsy of right hilar lymph nodes.  Former 43 pack year smoking history.  Presents for assessment prior to concurrent chemo T3 N1 M0  INTERVAL HISTORY Mark Mccarty is a 68 y.o. male who has above history reviewed by me presents for evaluation prior to immunotherapy with durvalumab for treatment of stage III A non-small cell lung cancer. Patient reports doing well at baseline today.  No new complaints Appetite is fair He takes potassium chloride supplementation daily. Denies any fever, chills, cough, diarrhea, chest pain, abdominal pain, or skin rash. . . Review of Systems  Constitutional: Negative for appetite change, chills, fatigue, fever and unexpected weight change.  [name]:   Negative for hearing loss and voice change.   Eyes: Negative for eye problems and icterus.  Respiratory: Negative for chest tightness, cough and shortness of breath.   Cardiovascular: Negative for chest pain and leg swelling.  Gastrointestinal: Negative for abdominal distention and abdominal pain.  Endocrine: Negative for hot flashes.  Genitourinary: Negative for difficulty urinating, dysuria and frequency.   Musculoskeletal: Negative for arthralgias.  Skin: Negative for itching and rash.  Neurological: Negative for light-headedness and numbness.  [name]: Negative for adenopathy. Does not bruise/bleed easily.  Psychiatric/Behavioral: Negative for confusion.     MEDICAL HISTORY:  Past Medical History:  Diagnosis Date  . Asthma   . Cancer ([place])    lung  . Cataract    left  cataract surgery  . Dyspnea   . [name]   . GERD (gastroesophageal reflux disease)   . Hypertension   . Neuromuscular disorder ([place])    pt has knees bilateral with tendon issues that cause pain    SURGICAL HISTORY: Past Surgical History:  Procedure Laterality Date  . CARDIAC CATHETERIZATION N/A 12/16/2014   Procedure: Left Heart Cath;  Surgeon: [name], MD;  Location: [facility];  Service: Cardiovascular;  Laterality: N/A;  . CYST REMOVAL TRUNK     chest and back over time and it was removed  . ENDOBRONCHIAL ULTRASOUND N/A 12/09/2017   Procedure: ENDOBRONCHIAL ULTRASOUND;  Surgeon: [name], MD;  Location: [name];  Service: Pulmonary;  Laterality: N/A;  . left cataract surgery    . PORTA CATH INSERTION N/A 12/23/2017   Procedure: PORTA CATH INSERTION;  Surgeon: [name], MD;  Location: [place] [facility];  Service: Cardiovascular;  Laterality: N/A;    SOCIAL HISTORY: Social History   Socioeconomic History  . Marital status: Divorced    Spouse name: Not on file  . Number of children: Not on file  . Years of education: Not on file  . Highest education level: Not on file  Occupational History  . Not on file  Social Needs  . Financial resource strain: Not on file  . Food insecurity    Worry: Not on file    Inability: Not on file  . Transportation needs    Medical: Not on file    Non-medical: Not on file  Tobacco Use  . Smoking status: Former Smoker    Packs/day: 1.50    Years: 35.00    Pack years:  52.50    Types: Cigarettes    Quit date: 06/22/2015    Years since quitting: 3.6  . Smokeless tobacco: Never Used  Substance and Sexual Activity  . Alcohol use: Yes    Comment: a fifth in a week  . Drug use: No  . Sexual activity: Not Currently  Lifestyle  . Physical activity    Days per week: Not on file    Minutes per session: Not on file  . Stress: Not on file  Relationships  . Social Herbalist on phone: Not  on file    Gets together: Not on file    Attends religious service: Not on file    Active member of club or organization: Not on file    Attends meetings of clubs or organizations: Not on file    Relationship status: Not on file  . Intimate partner violence    Fear of current or ex partner: Not on file    Emotionally abused: Not on file    Physically abused: Not on file    Forced sexual activity: Not on file  Other Topics Concern  . Not on file  Social History Narrative  . Not on file    FAMILY HISTORY: Family History  Problem Relation Age of Onset  . Hypertension Mother   . Breast cancer Mother   . Heart attack Mother   . Lung cancer Father   . Heart disease Sister   . Diabetes Sister   . Colon cancer Sister     ALLERGIES:  is allergic to penicillins.  MEDICATIONS:  Current Outpatient Medications  Medication Sig Dispense Refill  . acetaminophen (TYLENOL) 500 MG tablet Take 500 mg by mouth as needed.    [name] albuterol (PROVENTIL HFA;VENTOLIN HFA) 108 (90 Base) MCG/ACT inhaler Inhale 2 puffs into the lungs every 6 (six) hours as needed.  12  . amLODipine (NORVASC) 10 MG tablet Take 10 mg by mouth daily.    Jearl Klinefelter ELLIPTA 62.5-25 MCG/INH AEPB TAKE 1 PUFF BY MOUTH EVERY DAY 180 each 3  . aspirin 81 MG tablet Take 81 mg by mouth daily.    . carvedilol (COREG) 25 MG tablet Take 25 mg by mouth 2 (two) times daily with a meal.    . fluticasone (FLONASE) 50 MCG/ACT nasal spray PLACE 1 SPRAY INTO BOTH NOSTRILS 2 (TWO) TIMES DAILY  0  . gabapentin (NEURONTIN) 300 MG capsule Take 300 mg by mouth at bedtime as needed.     [name] ibuprofen (ADVIL,MOTRIN) 600 MG tablet Take 600 mg by mouth every 6 (six) hours as needed.    [name] KLOR-CON M20 20 MEQ tablet TAKE 2 TABLETS BY MOUTH DAILY 60 tablet 2  . lidocaine-prilocaine (EMLA) cream Apply to port area once 30 g 3  . lisinopril (PRINIVIL,ZESTRIL) 40 MG tablet Take 40 mg by mouth daily.    . pravastatin (PRAVACHOL) 20 MG tablet Take 20 mg by mouth  at bedtime.    . prochlorperazine (COMPAZINE) 10 MG tablet Take 1 tablet (10 mg total) by mouth every 6 (six) hours as needed (Nausea or vomiting). 30 tablet 1  . senna (SENOKOT) 8.6 MG TABS tablet Take 2 tablets (17.2 mg total) by mouth daily. 120 each 0  . tamsulosin (FLOMAX) 0.4 MG CAPS capsule Take 0.4 mg by mouth daily.     No current facility-administered medications for this visit.    Facility-Administered Medications Ordered in Other Visits  Medication Dose Route Frequency Provider Last Rate  Last Dose  . heparin lock flush 100 unit/mL  500 Units Intracatheter Once PRN [name], MD      . sodium chloride flush (NS) 0.9 % injection 10 mL  10 mL Intravenous PRN [name], MD   10 mL at 03/18/18 0858     PHYSICAL EXAMINATION: ECOG PERFORMANCE STATUS: 0 - Asymptomatic Vitals:   02/17/19 0851  BP: 133/77  Pulse: 68  Temp: (!) 97 F (36.1 C)   Filed Weights   02/17/19 0851  Weight: 223 lb 8 oz (101.4 kg)    Physical Exam Constitutional:      General: He is not in acute distress. [name]:     Head: Normocephalic and atraumatic.  Eyes:     General: No scleral icterus.    Conjunctiva/sclera: Conjunctivae normal.     Pupils: Pupils are equal, round, and reactive to light.  Neck:     Musculoskeletal: Normal range of motion and neck supple.  Cardiovascular:     Rate and Rhythm: Normal rate and regular rhythm.     Heart sounds: Normal heart sounds.  Pulmonary:     Effort: Pulmonary effort is normal. No respiratory distress.     Breath sounds: No wheezing or rales.     Comments: Decreased breath sound bilaterally Chest:     Chest wall: No tenderness.  Abdominal:     General: Bowel sounds are normal. There is no distension.     Palpations: Abdomen is soft. There is no mass.     Tenderness: There is no abdominal tenderness.  Musculoskeletal: Normal range of motion.        General: No deformity.  Lymphadenopathy:     Cervical: No cervical adenopathy.  Skin:    General: Skin  is warm and dry.     Findings: No erythema or rash.     Comments: Chest wall Mediport site no erythema or discharge. Left supraclavicular fossa fullness, chronic.  Neurological:     Mental Status: He is alert and oriented to person, place, and time.     Cranial Nerves: No cranial nerve deficit.     Coordination: Coordination normal.  Psychiatric:        Behavior: Behavior normal.        Thought Content: Thought content normal.      LABORATORY DATA:  I have reviewed the data as listed Lab Results  Component Value Date   WBC 6.4 02/17/2019   HGB 13.6 02/17/2019   HCT 40.0 02/17/2019   MCV 84.2 02/17/2019   PLT 272 02/17/2019   Recent Labs    01/20/19 0820 02/03/19 0832 02/17/19 0826  NA 139 139 138  K 3.8 3.9 3.9  CL 104 105 104  CO2 26 27 26   GLUCOSE 117* 117* 118*  BUN 13 14 13   CREATININE 0.89 0.90 0.83  CALCIUM 9.0 8.9 9.0  GFRNONAA >60 >60 >60  GFRAA >60 >60 >60  PROT 7.4 7.4 7.6  ALBUMIN 4.1 4.0 4.0  AST 24 23 22   ALT 23 22 21   ALKPHOS 49 57 57  BILITOT 0.6 0.5 0.5   RADIOGRAPHIC STUDIES: I have personally reviewed the radiological images as listed and agreed with the findings in the report..  03/21/2018 CT chest showed mild decreasing size of the right upper lobe pulmonary nodule.  Interval decrease in size of right paratracheal and right hilar adenopathy.  06/30/2018 CT chest showed interval development of extensive changes secondary to external beam radiation within the posterior right upper lobe and superior  segment of right lower lobe.  The enlarged right paratracheal lymph node is decreased in size in the interval.  Right upper lobe lung lesion is difficult to separate from surrounding changes due to radiation but is favored to have decrease in size in the interval.  Aortic atherosclerosis and emphysema.  LAD coronary artery atherosclerotic calcification.  08/06/2018 CT head without contrast Brain parenchymal appears unremarkable there is no mass or  hemorrhage.    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 Continued further decrease in the size of previously identified right.  Tracheal lymph node. Aortic atherosclerosis.  Emphysema.   ASSESSMENT & PLAN:  1. Primary lung adenocarcinoma, right ([place])   2. Encounter for antineoplastic immunotherapy   3. Hypokalemia    Stage IIIa T3 N1 M0 lung adenocarcinoma, s/p concurrent chemoradiation.  Currently on immunotherapy durvalumab. Overall patient tolerates immunotherapy very well. Labs reviewed and discussed with patient. Counts are acceptable to proceed with cycle 24 durvalumab treatments. Patient had questions about plan after he finishes 26 cycles of durvalumab treatments. I had a discussion with him about every 3 months surveillance CT, history and physical. He appreciate explanation.  Chronic hypokalemia, takes potassium 40 mEq daily.  Potassium level has been stable.  Continue supplementation. Continue monitoring levels . 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  Previous work-up negative for DVT.  Continue monitor . We spent sufficient time to discuss many aspect of care, questions were answered to patient's satisfaction. Total face to face encounter time for this patient visit was 25 min. >50% of the time was  spent in counseling and coordination of care.     Return visit, 2 weeks for evaluation prior to next durvalumab treatment.    [name], MD, PhD

## 2019-02-17 NOTE — Progress Notes (Signed)
Pt reports no concerns at this time.

## 2019-03-03 ENCOUNTER — Inpatient Hospital Stay (HOSPITAL_BASED_OUTPATIENT_CLINIC_OR_DEPARTMENT_OTHER): Payer: Medicare Other | Admitting: Oncology

## 2019-03-03 ENCOUNTER — Encounter (INDEPENDENT_AMBULATORY_CARE_PROVIDER_SITE_OTHER): Payer: Self-pay

## 2019-03-03 ENCOUNTER — Inpatient Hospital Stay: Payer: Medicare Other

## 2019-03-03 ENCOUNTER — Other Ambulatory Visit: Payer: Self-pay

## 2019-03-03 ENCOUNTER — Encounter: Payer: Self-pay | Admitting: Oncology

## 2019-03-03 ENCOUNTER — Inpatient Hospital Stay: Payer: Medicare Other | Attending: Oncology

## 2019-03-03 VITALS — BP 130/80 | HR 69 | Temp 96.2°F | Wt 224.3 lb

## 2019-03-03 DIAGNOSIS — Z95828 Presence of other vascular implants and grafts: Secondary | ICD-10-CM

## 2019-03-03 DIAGNOSIS — C3491 Malignant neoplasm of unspecified part of right bronchus or lung: Secondary | ICD-10-CM

## 2019-03-03 DIAGNOSIS — Z79899 Other long term (current) drug therapy: Secondary | ICD-10-CM | POA: Insufficient documentation

## 2019-03-03 DIAGNOSIS — I10 Essential (primary) hypertension: Secondary | ICD-10-CM | POA: Diagnosis not present

## 2019-03-03 DIAGNOSIS — Z791 Long term (current) use of non-steroidal anti-inflammatories (NSAID): Secondary | ICD-10-CM | POA: Diagnosis not present

## 2019-03-03 DIAGNOSIS — E876 Hypokalemia: Secondary | ICD-10-CM | POA: Diagnosis not present

## 2019-03-03 DIAGNOSIS — J45909 Unspecified asthma, uncomplicated: Secondary | ICD-10-CM | POA: Insufficient documentation

## 2019-03-03 DIAGNOSIS — Z7982 Long term (current) use of aspirin: Secondary | ICD-10-CM | POA: Insufficient documentation

## 2019-03-03 DIAGNOSIS — Z87891 Personal history of nicotine dependence: Secondary | ICD-10-CM | POA: Insufficient documentation

## 2019-03-03 DIAGNOSIS — Z5112 Encounter for antineoplastic immunotherapy: Secondary | ICD-10-CM | POA: Diagnosis not present

## 2019-03-03 DIAGNOSIS — R222 Localized swelling, mass and lump, trunk: Secondary | ICD-10-CM | POA: Diagnosis not present

## 2019-03-03 DIAGNOSIS — C3411 Malignant neoplasm of upper lobe, right bronchus or lung: Secondary | ICD-10-CM | POA: Insufficient documentation

## 2019-03-03 LAB — COMPREHENSIVE METABOLIC PANEL
ALT: 25 U/L (ref 0–44)
AST: 26 U/L (ref 15–41)
Albumin: 4.2 g/dL (ref 3.5–5.0)
Alkaline Phosphatase: 60 U/L (ref 38–126)
Anion gap: 7 (ref 5–15)
BUN: 12 mg/dL (ref 8–23)
CO2: 28 mmol/L (ref 22–32)
Calcium: 9 mg/dL (ref 8.9–10.3)
Chloride: 102 mmol/L (ref 98–111)
Creatinine, Ser: 0.78 mg/dL (ref 0.61–1.24)
GFR calc Af Amer: >60 mL/min (ref >60)
GFR calc non Af Amer: >60 mL/min (ref >60)
Glucose, Bld: 122 mg/dL — ABNORMAL HIGH (ref 70–99)
Potassium: 3.6 mmol/L (ref 3.5–5.1)
Sodium: 137 mmol/L (ref 135–145)
Total Bilirubin: 0.8 mg/dL (ref 0.3–1.2)
Total Protein: 7.3 g/dL (ref 6.5–8.1)

## 2019-03-03 LAB — CBC WITH DIFFERENTIAL/PLATELET
Abs Immature Granulocytes: 0.02 10*3/uL (ref 0.00–0.07)
Basophils Absolute: 0 10*3/uL (ref 0.0–0.1)
Basophils Relative: 0 %
Eosinophils Absolute: 0.2 10*3/uL (ref 0.0–0.5)
Eosinophils Relative: 3 %
HCT: 39.8 % (ref 39.0–52.0)
Hemoglobin: 13.7 g/dL (ref 13.0–17.0)
Immature Granulocytes: 0 %
Lymphocytes Relative: 13 %
Lymphs Abs: 0.8 10*3/uL (ref 0.7–4.0)
MCH: 28.7 pg (ref 26.0–34.0)
MCHC: 34.4 g/dL (ref 30.0–36.0)
MCV: 83.3 fL (ref 80.0–100.0)
Monocytes Absolute: 0.7 10*3/uL (ref 0.1–1.0)
Monocytes Relative: 12 %
Neutro Abs: 4.3 10*3/uL (ref 1.7–7.7)
Neutrophils Relative %: 72 %
Platelets: 257 10*3/uL (ref 150–400)
RBC: 4.78 MIL/uL (ref 4.22–5.81)
RDW: 13.5 % (ref 11.5–15.5)
WBC: 6 10*3/uL (ref 4.0–10.5)
nRBC: 0 % (ref 0.0–0.2)

## 2019-03-03 MED ORDER — SODIUM CHLORIDE 0.9 % IV SOLN
Freq: Once | INTRAVENOUS | Status: AC
  Administered 2019-03-03: 10:00:00 via INTRAVENOUS
  Filled 2019-03-03: qty 250

## 2019-03-03 MED ORDER — SODIUM CHLORIDE 0.9 % IV SOLN
10.0000 mg/kg | Freq: Once | INTRAVENOUS | Status: AC
  Administered 2019-03-03: 960 mg via INTRAVENOUS
  Filled 2019-03-03: qty 10

## 2019-03-03 MED ORDER — SODIUM CHLORIDE 0.9% FLUSH
10.0000 mL | Freq: Once | INTRAVENOUS | Status: AC
  Administered 2019-03-03: 09:00:00 10 mL via INTRAVENOUS
  Filled 2019-03-03: qty 10

## 2019-03-03 MED ORDER — HEPARIN SOD (PORK) LOCK FLUSH 100 UNIT/ML IV SOLN
500.0000 [IU] | Freq: Once | INTRAVENOUS | Status: AC | PRN
  Administered 2019-03-03: 500 [IU]
  Filled 2019-03-03: qty 5

## 2019-03-04 NOTE — Progress Notes (Signed)
Hematology/Oncology Follow up note [facility] Telephone:(336) [phone] Fax:(336) [phone]   Patient Care Team: [name], MD as PCP - General (Internal Medicine) [name], RN as Registered Nurse   REASON FOR VISIT Follow up for immunotherapy treatment for stage IIIA lung adenocarcinoma  Oncology History:  Mark Mccarty is a  68 y.o.  male with  adenocarcinoma of lung, diagnosed via biopsy of right hilar lymph nodes.  Former 43 pack year smoking history.  Presents for assessment prior to concurrent chemo T3 N1 M0  INTERVAL HISTORY Mark Mccarty is a 68 y.o. male who has above history reviewed by me presents for evaluation prior to immunotherapy with durvalumab for treatment of stage III A non-small cell lung cancer. Patient reports doing well at baseline.  No no new complaints Appetite is fair.  He takes potassium chloride 40 mEq daily. Patient reports doing well at baseline today.  No new complaints Appetite is fair He denies any fever, chills, cough, diarrhea, chest pain, abdominal pain or skin rash.  Review of Systems  Constitutional: Negative for appetite change, chills, fatigue, fever and unexpected weight change.  [name]:   Negative for hearing loss and voice change.   Eyes: Negative for eye problems and icterus.  Respiratory: Negative for chest tightness, cough and shortness of breath.   Cardiovascular: Negative for chest pain and leg swelling.  Gastrointestinal: Negative for abdominal distention and abdominal pain.  Endocrine: Negative for hot flashes.  Genitourinary: Negative for difficulty urinating, dysuria and frequency.   Musculoskeletal: Negative for arthralgias.  Skin: Negative for itching and rash.  Neurological: Negative for light-headedness and numbness.  [name]: Negative for adenopathy. Does not bruise/bleed easily.  Psychiatric/Behavioral: Negative for confusion.     MEDICAL HISTORY:  Past Medical History:   Diagnosis Date  . Asthma   . Cancer ([place])    lung  . Cataract    left cataract surgery  . Dyspnea   . [name]   . GERD (gastroesophageal reflux disease)   . Hypertension   . Neuromuscular disorder ([place])    pt has knees bilateral with tendon issues that cause pain    SURGICAL HISTORY: Past Surgical History:  Procedure Laterality Date  . CARDIAC CATHETERIZATION N/A 12/16/2014   Procedure: Left Heart Cath;  Surgeon: [name], MD;  Location: [place] [facility];  Service: Cardiovascular;  Laterality: N/A;  . CYST REMOVAL TRUNK     chest and back over time and it was removed  . ENDOBRONCHIAL ULTRASOUND N/A 12/09/2017   Procedure: ENDOBRONCHIAL ULTRASOUND;  Surgeon: [name], MD;  Location: [name];  Service: Pulmonary;  Laterality: N/A;  . left cataract surgery    . PORTA CATH INSERTION N/A 12/23/2017   Procedure: PORTA CATH INSERTION;  Surgeon: [name], MD;  Location: [facility];  Service: Cardiovascular;  Laterality: N/A;    SOCIAL HISTORY: Social History   Socioeconomic History  . Marital status: Divorced    Spouse name: Not on file  . Number of children: Not on file  . Years of education: Not on file  . Highest education level: Not on file  Occupational History  . Not on file  Social Needs  . Financial resource strain: Not on file  . Food insecurity    Worry: Not on file    Inability: Not on file  . Transportation needs    Medical: Not on file    Non-medical: Not on file  Tobacco Use  . Smoking status: Former  Smoker    Packs/day: 1.50    Years: 35.00    Pack years: 52.50    Types: Cigarettes    Quit date: 06/22/2015    Years since quitting: 3.7  . Smokeless tobacco: Never Used  Substance and Sexual Activity  . Alcohol use: Yes    Comment: a fifth in a week  . Drug use: No  . Sexual activity: Not Currently  Lifestyle  . Physical activity    Days per week: Not on file    Minutes per session: Not on file  .  Stress: Not on file  Relationships  . Social Herbalist on phone: Not on file    Gets together: Not on file    Attends religious service: Not on file    Active member of club or organization: Not on file    Attends meetings of clubs or organizations: Not on file    Relationship status: Not on file  . Intimate partner violence    Fear of current or ex partner: Not on file    Emotionally abused: Not on file    Physically abused: Not on file    Forced sexual activity: Not on file  Other Topics Concern  . Not on file  Social History Narrative  . Not on file    FAMILY HISTORY: Family History  Problem Relation Age of Onset  . Hypertension Mother   . Breast cancer Mother   . Heart attack Mother   . Lung cancer Father   . Heart disease Sister   . Diabetes Sister   . Colon cancer Sister     ALLERGIES:  is allergic to penicillins.  MEDICATIONS:  Current Outpatient Medications  Medication Sig Dispense Refill  . acetaminophen (TYLENOL) 500 MG tablet Take 500 mg by mouth as needed.    [name] albuterol (PROVENTIL HFA;VENTOLIN HFA) 108 (90 Base) MCG/ACT inhaler Inhale 2 puffs into the lungs every 6 (six) hours as needed.  12  . amLODipine (NORVASC) 10 MG tablet Take 10 mg by mouth daily.    Jearl Klinefelter ELLIPTA 62.5-25 MCG/INH AEPB TAKE 1 PUFF BY MOUTH EVERY DAY 180 each 3  . aspirin 81 MG tablet Take 81 mg by mouth daily.    . carvedilol (COREG) 25 MG tablet Take 25 mg by mouth 2 (two) times daily with a meal.    . fluticasone (FLONASE) 50 MCG/ACT nasal spray PLACE 1 SPRAY INTO BOTH NOSTRILS 2 (TWO) TIMES DAILY  0  . gabapentin (NEURONTIN) 300 MG capsule Take 300 mg by mouth at bedtime as needed.     [name] ibuprofen (ADVIL,MOTRIN) 600 MG tablet Take 600 mg by mouth every 6 (six) hours as needed.    [name] KLOR-CON M20 20 MEQ tablet TAKE 2 TABLETS BY MOUTH DAILY 60 tablet 2  . lidocaine-prilocaine (EMLA) cream Apply to port area once 30 g 3  . lisinopril (PRINIVIL,ZESTRIL) 40 MG tablet Take  40 mg by mouth daily.    . pravastatin (PRAVACHOL) 20 MG tablet Take 20 mg by mouth at bedtime.    . prochlorperazine (COMPAZINE) 10 MG tablet Take 1 tablet (10 mg total) by mouth every 6 (six) hours as needed (Nausea or vomiting). 30 tablet 1  . senna (SENOKOT) 8.6 MG TABS tablet Take 2 tablets (17.2 mg total) by mouth daily. 120 each 0  . tamsulosin (FLOMAX) 0.4 MG CAPS capsule Take 0.4 mg by mouth daily.     No current facility-administered medications for this visit.  Facility-Administered Medications Ordered in Other Visits  Medication Dose Route Frequency Provider Last Rate Last Dose  . sodium chloride flush (NS) 0.9 % injection 10 mL  10 mL Intravenous PRN [name], MD   10 mL at 03/18/18 0858     PHYSICAL EXAMINATION: ECOG PERFORMANCE STATUS: 0 - Asymptomatic Vitals:   03/03/19 0935  BP: 130/80  Pulse: 69  Temp: (!) 96.2 F (35.7 C)   Filed Weights   03/03/19 0935  Weight: 224 lb 5 oz (101.7 kg)    Physical Exam Constitutional:      General: He is not in acute distress. [name]:     Head: Normocephalic and atraumatic.  Eyes:     General: No scleral icterus.    Conjunctiva/sclera: Conjunctivae normal.     Pupils: Pupils are equal, round, and reactive to light.  Neck:     Musculoskeletal: Normal range of motion and neck supple.  Cardiovascular:     Rate and Rhythm: Normal rate and regular rhythm.     Heart sounds: Normal heart sounds.  Pulmonary:     Effort: Pulmonary effort is normal. No respiratory distress.     Breath sounds: No wheezing or rales.     Comments: Decreased breath sound bilaterally Chest:     Chest wall: No tenderness.  Abdominal:     General: Bowel sounds are normal. There is no distension.     Palpations: Abdomen is soft. There is no mass.     Tenderness: There is no abdominal tenderness.  Musculoskeletal: Normal range of motion.        General: No deformity.  Lymphadenopathy:     Cervical: No cervical adenopathy.  Skin:    General:  Skin is warm and dry.     Findings: No erythema or rash.     Comments: Chest wall Mediport site no erythema or discharge. Left supraclavicular fossa fullness, chronic.  Neurological:     Mental Status: He is alert and oriented to person, place, and time.     Cranial Nerves: No cranial nerve deficit.     Coordination: Coordination normal.  Psychiatric:        Behavior: Behavior normal.        Thought Content: Thought content normal.      LABORATORY DATA:  I have reviewed the data as listed Lab Results  Component Value Date   WBC 6.0 03/03/2019   HGB 13.7 03/03/2019   HCT 39.8 03/03/2019   MCV 83.3 03/03/2019   PLT 257 03/03/2019   Recent Labs    02/03/19 0832 02/17/19 0826 03/03/19 0839  NA 139 138 137  K 3.9 3.9 3.6  CL 105 104 102  CO2 27 26 28   GLUCOSE 117* 118* 122*  BUN 14 13 12   CREATININE 0.90 0.83 0.78  CALCIUM 8.9 9.0 9.0  GFRNONAA >60 >60 >60  GFRAA >60 >60 >60  PROT 7.4 7.6 7.3  ALBUMIN 4.0 4.0 4.2  AST 23 22 26   ALT 22 21 25   ALKPHOS 57 57 60  BILITOT 0.5 0.5 0.8   RADIOGRAPHIC STUDIES: I have personally reviewed the radiological images as listed and agreed with the findings in the report..  03/21/2018 CT chest showed mild decreasing size of the right upper lobe pulmonary nodule.  Interval decrease in size of right paratracheal and right hilar adenopathy.  06/30/2018 CT chest showed interval development of extensive changes secondary to external beam radiation within the posterior right upper lobe and superior segment of right lower lobe.  The enlarged right paratracheal lymph node is decreased in size in the interval.  Right upper lobe lung lesion is difficult to separate from surrounding changes due to radiation but is favored to have decrease in size in the interval.  Aortic atherosclerosis and emphysema.  LAD coronary artery atherosclerotic calcification.  08/06/2018 CT head without contrast Brain parenchymal appears unremarkable there is no mass or  hemorrhage.    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 Continued further decrease in the size of previously identified right.  Tracheal lymph node. Aortic atherosclerosis.  Emphysema.   ASSESSMENT & PLAN:  1. Primary lung adenocarcinoma, right ([place])   2. Encounter for antineoplastic immunotherapy   3. Hypokalemia   4. Supraclavicular fossa fullness   5. Port-A-Cath in place    Stage IIIa T3 N1 M0 lung adenocarcinoma, s/p concurrent chemoradiation.  Currently on immunotherapy durvalumab. Patient tolerated immunotherapy well. Labs are reviewed and discussed with patient .  Counts acceptable to proceed with cycle 25 durvalumab treatments.    Chronic hypokalemia, patient is taking potassium 40 mEq daily.  Potassium level is been stable. Continue supplementation.  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  Previous work-up negative for DVT.  Continue monitor . We spent sufficient time to discuss many aspect of care, questions were answered to patient's satisfaction. Total face to face encounter time for this patient visit was 25 min. >50% of the time was  spent in counseling and coordination of care.     Return visit, 2 weeks for evaluation prior to next durvalumab treatment.    [name], MD, PhD

## 2019-03-16 ENCOUNTER — Encounter: Payer: Self-pay | Admitting: Oncology

## 2019-03-16 NOTE — Progress Notes (Signed)
Patient stated that he had been doing well with no complaints. 

## 2019-03-17 ENCOUNTER — Inpatient Hospital Stay: Payer: Medicare Other

## 2019-03-17 ENCOUNTER — Inpatient Hospital Stay (HOSPITAL_BASED_OUTPATIENT_CLINIC_OR_DEPARTMENT_OTHER): Payer: Medicare Other | Admitting: Oncology

## 2019-03-17 ENCOUNTER — Encounter: Payer: Self-pay | Admitting: Oncology

## 2019-03-17 ENCOUNTER — Other Ambulatory Visit: Payer: Self-pay

## 2019-03-17 VITALS — BP 137/84 | HR 69 | Temp 97.8°F | Resp 18 | Wt 226.0 lb

## 2019-03-17 DIAGNOSIS — C3491 Malignant neoplasm of unspecified part of right bronchus or lung: Secondary | ICD-10-CM

## 2019-03-17 DIAGNOSIS — Z5112 Encounter for antineoplastic immunotherapy: Secondary | ICD-10-CM | POA: Diagnosis not present

## 2019-03-17 DIAGNOSIS — E876 Hypokalemia: Secondary | ICD-10-CM

## 2019-03-17 DIAGNOSIS — Z95828 Presence of other vascular implants and grafts: Secondary | ICD-10-CM | POA: Diagnosis not present

## 2019-03-17 LAB — CBC WITH DIFFERENTIAL/PLATELET
Abs Immature Granulocytes: 0.02 10*3/uL (ref 0.00–0.07)
Basophils Absolute: 0 10*3/uL (ref 0.0–0.1)
Basophils Relative: 1 %
Eosinophils Absolute: 0.1 10*3/uL (ref 0.0–0.5)
Eosinophils Relative: 2 %
HCT: 39.4 % (ref 39.0–52.0)
Hemoglobin: 13.5 g/dL (ref 13.0–17.0)
Immature Granulocytes: 0 %
Lymphocytes Relative: 13 %
Lymphs Abs: 0.8 10*3/uL (ref 0.7–4.0)
MCH: 28.5 pg (ref 26.0–34.0)
MCHC: 34.3 g/dL (ref 30.0–36.0)
MCV: 83.3 fL (ref 80.0–100.0)
Monocytes Absolute: 0.8 10*3/uL (ref 0.1–1.0)
Monocytes Relative: 13 %
Neutro Abs: 4.3 10*3/uL (ref 1.7–7.7)
Neutrophils Relative %: 71 %
Platelets: 250 10*3/uL (ref 150–400)
RBC: 4.73 MIL/uL (ref 4.22–5.81)
RDW: 13.6 % (ref 11.5–15.5)
WBC: 6 10*3/uL (ref 4.0–10.5)
nRBC: 0 % (ref 0.0–0.2)

## 2019-03-17 LAB — COMPREHENSIVE METABOLIC PANEL
ALT: 20 U/L (ref 0–44)
AST: 22 U/L (ref 15–41)
Albumin: 4.1 g/dL (ref 3.5–5.0)
Alkaline Phosphatase: 62 U/L (ref 38–126)
Anion gap: 10 (ref 5–15)
BUN: 11 mg/dL (ref 8–23)
CO2: 27 mmol/L (ref 22–32)
Calcium: 9 mg/dL (ref 8.9–10.3)
Chloride: 100 mmol/L (ref 98–111)
Creatinine, Ser: 0.79 mg/dL (ref 0.61–1.24)
GFR calc Af Amer: >60 mL/min (ref >60)
GFR calc non Af Amer: >60 mL/min (ref >60)
Glucose, Bld: 125 mg/dL — ABNORMAL HIGH (ref 70–99)
Potassium: 3.5 mmol/L (ref 3.5–5.1)
Sodium: 137 mmol/L (ref 135–145)
Total Bilirubin: 0.7 mg/dL (ref 0.3–1.2)
Total Protein: 7.3 g/dL (ref 6.5–8.1)

## 2019-03-17 MED ORDER — SODIUM CHLORIDE 0.9% FLUSH
10.0000 mL | Freq: Once | INTRAVENOUS | Status: AC
  Administered 2019-03-17: 10 mL via INTRAVENOUS
  Filled 2019-03-17: qty 10

## 2019-03-17 MED ORDER — SODIUM CHLORIDE 0.9 % IV SOLN
10.2000 mg/kg | Freq: Once | INTRAVENOUS | Status: AC
  Administered 2019-03-17: 980 mg via INTRAVENOUS
  Filled 2019-03-17: qty 10

## 2019-03-17 MED ORDER — HEPARIN SOD (PORK) LOCK FLUSH 100 UNIT/ML IV SOLN
500.0000 [IU] | Freq: Once | INTRAVENOUS | Status: AC | PRN
  Administered 2019-03-17: 500 [IU]

## 2019-03-17 MED ORDER — POTASSIUM CHLORIDE CRYS ER 20 MEQ PO TBCR: 40.0000 meq | EXTENDED_RELEASE_TABLET | Freq: Every day | ORAL | 2 refills | Status: DC

## 2019-03-17 MED ORDER — SODIUM CHLORIDE 0.9 % IV SOLN
Freq: Once | INTRAVENOUS | Status: AC
  Administered 2019-03-17: 10:00:00 via INTRAVENOUS
  Filled 2019-03-17: qty 250

## 2019-03-17 NOTE — Progress Notes (Signed)
Hematology/Oncology Follow up note [facility] [phone] [phone]   Patient Care Team: [name], MD as PCP - General (Internal Medicine) [name], RN as Registered Nurse   REASON FOR VISIT Follow up for immunotherapy treatment for stage IIIA lung adenocarcinoma  Oncology History:  Mark Mccarty is a  68 y.o.  male with  adenocarcinoma of lung, diagnosed via biopsy of right hilar lymph nodes.  Former 43 pack year smoking history.  Presents for assessment prior to concurrent chemo T3 N1 M0  INTERVAL HISTORY Mark Mccarty is a 68 y.o. male who has above history reviewed by me presents for evaluation prior to immunotherapy with durvalumab for treatment of stage III A non-small cell lung cancer. Patient reports doing well at baseline.  No new complaints. Appetite is fair. Chronic shortness of breath at baseline. He takes potassium chloride 40 mEq daily.  Denies any nausea vomiting diarrhea, fever or chills . Review of Systems  Constitutional: Negative for appetite change, chills, fatigue, fever and unexpected weight change.  [name]:   Negative for hearing loss and voice change.   Eyes: Negative for eye problems and icterus.  Respiratory: Negative for chest tightness, cough and shortness of breath.   Cardiovascular: Negative for chest pain and leg swelling.  Gastrointestinal: Negative for abdominal distention and abdominal pain.  Endocrine: Negative for hot flashes.  Genitourinary: Negative for difficulty urinating, dysuria and frequency.   Musculoskeletal: Negative for arthralgias.  Skin: Negative for itching and rash.  Neurological: Negative for light-headedness and numbness.  [name]: Negative for adenopathy. Does not bruise/bleed easily.  Psychiatric/Behavioral: Negative for confusion.     MEDICAL HISTORY:  Past Medical History:  Diagnosis Date   Asthma    Cancer ([place])    lung   Cataract    left  cataract surgery   Dyspnea    [name]    GERD (gastroesophageal reflux disease)    Hypertension    Neuromuscular disorder ([place])    pt has knees bilateral with tendon issues that cause pain    SURGICAL HISTORY: Past Surgical History:  Procedure Laterality Date   CARDIAC CATHETERIZATION N/A 12/16/2014   Procedure: Left Heart Cath;  Surgeon: [name], MD;  Location: [place] [facility];  Service: Cardiovascular;  Laterality: N/A;   CYST REMOVAL TRUNK     chest and back over time and it was removed   ENDOBRONCHIAL ULTRASOUND N/A 12/09/2017   Procedure: ENDOBRONCHIAL ULTRASOUND;  Surgeon: [name], MD;  Location: [name];  Service: Pulmonary;  Laterality: N/A;   left cataract surgery     PORTA CATH INSERTION N/A 12/23/2017   Procedure: PORTA CATH INSERTION;  Surgeon: [name], MD;  Location: [place] [facility];  Service: Cardiovascular;  Laterality: N/A;    SOCIAL HISTORY: Social History   Socioeconomic History   Marital status: Divorced    Spouse name: Not on file   Number of children: Not on file   Years of education: Not on file   Highest education level: Not on file  Occupational History   Not on file  Social Needs   Financial resource strain: Not on file   Food insecurity    Worry: Not on file    Inability: Not on file   Transportation needs    Medical: Not on file    Non-medical: Not on file  Tobacco Use   Smoking status: Former Smoker    Packs/day: 1.50    Years: 35.00    Pack  years: 52.50    Types: Cigarettes    Quit date: 06/22/2015    Years since quitting: 3.7   Smokeless tobacco: Never Used  Substance and Sexual Activity   Alcohol use: Yes    Comment: a fifth in a week   Drug use: No   Sexual activity: Not Currently  Lifestyle   Physical activity    Days per week: Not on file    Minutes per session: Not on file   Stress: Not on file  Relationships   Social connections    Talks on phone: Not  on file    Gets together: Not on file    Attends religious service: Not on file    Active member of club or organization: Not on file    Attends meetings of clubs or organizations: Not on file    Relationship status: Not on file   Intimate partner violence    Fear of current or ex partner: Not on file    Emotionally abused: Not on file    Physically abused: Not on file    Forced sexual activity: Not on file  Other Topics Concern   Not on file  Social History Narrative   Not on file    FAMILY HISTORY: Family History  Problem Relation Age of Onset   Hypertension Mother    Breast cancer Mother    Heart attack Mother    Lung cancer Father    Heart disease Sister    Diabetes Sister    Colon cancer Sister     ALLERGIES:  is allergic to penicillins.  MEDICATIONS:  Current Outpatient Medications  Medication Sig Dispense Refill   acetaminophen (TYLENOL) 500 MG tablet Take 500 mg by mouth as needed.     albuterol (PROVENTIL HFA;VENTOLIN HFA) 108 (90 Base) MCG/ACT inhaler Inhale 2 puffs into the lungs every 6 (six) hours as needed.  12   amLODipine (NORVASC) 10 MG tablet Take 10 mg by mouth daily.     ANORO ELLIPTA 62.5-25 MCG/INH AEPB TAKE 1 PUFF BY MOUTH EVERY DAY 180 each 3   aspirin 81 MG tablet Take 81 mg by mouth daily.     carvedilol (COREG) 25 MG tablet Take 25 mg by mouth 2 (two) times daily with a meal.     fluticasone (FLONASE) 50 MCG/ACT nasal spray PLACE 1 SPRAY INTO BOTH NOSTRILS 2 (TWO) TIMES DAILY  0   gabapentin (NEURONTIN) 300 MG capsule Take 300 mg by mouth at bedtime as needed.      ibuprofen (ADVIL,MOTRIN) 600 MG tablet Take 600 mg by mouth every 6 (six) hours as needed.     lidocaine-prilocaine (EMLA) cream Apply to port area once 30 g 3   lisinopril (PRINIVIL,ZESTRIL) 40 MG tablet Take 40 mg by mouth daily.     potassium chloride SA (KLOR-CON M20) 20 MEQ tablet Take 2 tablets (40 mEq total) by mouth daily. 60 tablet 2   pravastatin  (PRAVACHOL) 20 MG tablet Take 20 mg by mouth at bedtime.     tamsulosin (FLOMAX) 0.4 MG CAPS capsule Take 0.4 mg by mouth daily.     prochlorperazine (COMPAZINE) 10 MG tablet Take 1 tablet (10 mg total) by mouth every 6 (six) hours as needed (Nausea or vomiting). (Patient not taking: Reported on 03/16/2019) 30 tablet 1   senna (SENOKOT) 8.6 MG TABS tablet Take 2 tablets (17.2 mg total) by mouth daily. (Patient not taking: Reported on 03/16/2019) 120 each 0   No current facility-administered medications for  this visit.    Facility-Administered Medications Ordered in Other Visits  Medication Dose Route Frequency Provider Last Rate Last Dose   sodium chloride flush (NS) 0.9 % injection 10 mL  10 mL Intravenous PRN [name], MD   10 mL at 03/18/18 0858     PHYSICAL EXAMINATION: ECOG PERFORMANCE STATUS: 0 - Asymptomatic Vitals:   03/17/19 0846  BP: 137/84  Pulse: 69  Resp: 18  Temp: 97.8 F (36.6 C)  SpO2: 98%   Filed Weights   03/17/19 0846  Weight: 226 lb (102.5 kg)    Physical Exam Constitutional:      General: He is not in acute distress. [name]:     Head: Normocephalic and atraumatic.  Eyes:     General: No scleral icterus.    Conjunctiva/sclera: Conjunctivae normal.     Pupils: Pupils are equal, round, and reactive to light.  Neck:     Musculoskeletal: Normal range of motion and neck supple.  Cardiovascular:     Rate and Rhythm: Normal rate and regular rhythm.     Heart sounds: Normal heart sounds.  Pulmonary:     Effort: Pulmonary effort is normal. No respiratory distress.     Breath sounds: No wheezing or rales.     Comments: Decreased breath sound bilaterally Chest:     Chest wall: No tenderness.  Abdominal:     General: Bowel sounds are normal. There is no distension.     Palpations: Abdomen is soft. There is no mass.     Tenderness: There is no abdominal tenderness.  Musculoskeletal: Normal range of motion.        General: No deformity.  Lymphadenopathy:       Cervical: No cervical adenopathy.  Skin:    General: Skin is warm and dry.     Findings: No erythema or rash.     Comments: Chest wall Mediport site no erythema or discharge. Left supraclavicular fossa fullness, chronic.  Neurological:     Mental Status: He is alert and oriented to person, place, and time.     Cranial Nerves: No cranial nerve deficit.     Coordination: Coordination normal.  Psychiatric:        Behavior: Behavior normal.        Thought Content: Thought content normal.      LABORATORY DATA:  I have reviewed the data as listed Lab Results  Component Value Date   WBC 6.0 03/17/2019   HGB 13.5 03/17/2019   HCT 39.4 03/17/2019   MCV 83.3 03/17/2019   PLT 250 03/17/2019   Recent Labs    02/17/19 0826 03/03/19 0839 03/17/19 0832  NA 138 137 137  K 3.9 3.6 3.5  CL 104 102 100  CO2 26 28 27   GLUCOSE 118* 122* 125*  BUN 13 12 11   CREATININE 0.83 0.78 0.79  CALCIUM 9.0 9.0 9.0  GFRNONAA >60 >60 >60  GFRAA >60 >60 >60  PROT 7.6 7.3 7.3  ALBUMIN 4.0 4.2 4.1  AST 22 26 22   ALT 21 25 20   ALKPHOS 57 60 62  BILITOT 0.5 0.8 0.7   RADIOGRAPHIC STUDIES: I have personally reviewed the radiological images as listed and agreed with the findings in the report..  03/21/2018 CT chest showed mild decreasing size of the right upper lobe pulmonary nodule.  Interval decrease in size of right paratracheal and right hilar adenopathy.  06/30/2018 CT chest showed interval development of extensive changes secondary to external beam radiation within the posterior right upper  lobe and superior segment of right lower lobe.  The enlarged right paratracheal lymph node is decreased in size in the interval.  Right upper lobe lung lesion is difficult to separate from surrounding changes due to radiation but is favored to have decrease in size in the interval.  Aortic atherosclerosis and emphysema.  LAD coronary artery atherosclerotic calcification.  08/06/2018 CT head without  contrast Brain parenchymal appears unremarkable there is no mass or hemorrhage.    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 Continued further decrease in the size of previously identified right.  Tracheal lymph node. Aortic atherosclerosis.  Emphysema.   ASSESSMENT & PLAN:  1. Primary lung adenocarcinoma, right ([place])   2. Hypokalemia   3. Encounter for antineoplastic immunotherapy   4. Port-A-Cath in place    Stage IIIA T3 N1 M0 lung adenocarcinoma, s/p concurrent chemoradiation.  Currently on immunotherapy durvalumab. Labs are reviewed and discussed with patient. Counts acceptable to proceed with cycle 26 durvalumab treatments. He is completing 1 year immunotherapy with durvalumab today. Discussed with patient about surveillance plan. Plan repeat CT scans in November 2020 and thereafter follow every 3 months with imaging surveillance.  Port-A-Cath in place, discussed with patient about keeping Mediport for 1 to 2 years after finishing therapy. Port flush every 6 to 8 weeks x 6 to be scheduled today.  Chronic hypokalemia, electrolyte wasting.  Patient is taking potassium 40 mEq daily.  Will refill potassium prescription. Potassium level has been stable.  Recommend patient recheck potassium level monthly.   . We spent sufficient time to discuss many aspect of care, questions were answered to patient's satisfaction.  Return visit, repeat CT chest without scan in November 2020.  Follow-up in the clinic lab MD after CT scan.   [name], MD, PhD

## 2019-03-17 NOTE — Progress Notes (Signed)
Pt in for follow up, denies any concerns today. 

## 2019-03-18 DIAGNOSIS — G620 Drug-induced polyneuropathy: Secondary | ICD-10-CM | POA: Insufficient documentation

## 2019-03-18 DIAGNOSIS — G629 Polyneuropathy, unspecified: Secondary | ICD-10-CM | POA: Insufficient documentation

## 2019-03-18 DIAGNOSIS — J432 Centrilobular emphysema: Secondary | ICD-10-CM | POA: Insufficient documentation

## 2019-04-03 ENCOUNTER — Encounter: Payer: Self-pay | Admitting: Urology

## 2019-04-03 ENCOUNTER — Other Ambulatory Visit: Payer: Medicare Other

## 2019-04-06 ENCOUNTER — Other Ambulatory Visit: Payer: Self-pay

## 2019-04-06 ENCOUNTER — Ambulatory Visit: Payer: Medicare Other | Admitting: Urology

## 2019-04-06 ENCOUNTER — Other Ambulatory Visit: Payer: Medicare Other

## 2019-04-06 DIAGNOSIS — R972 Elevated prostate specific antigen [PSA]: Secondary | ICD-10-CM

## 2019-04-06 NOTE — Addendum Note (Signed)
Addended by: Maryln Gottron on: 04/06/2019 02:44 PM   Modules accepted: Orders

## 2019-04-07 DIAGNOSIS — K429 Umbilical hernia without obstruction or gangrene: Secondary | ICD-10-CM | POA: Insufficient documentation

## 2019-04-07 LAB — PSA: Prostate Specific Ag, Serum: 3.1 ng/mL (ref 0.0–4.0)

## 2019-04-09 ENCOUNTER — Ambulatory Visit: Payer: Medicare Other | Admitting: Urology

## 2019-04-09 ENCOUNTER — Other Ambulatory Visit: Payer: Self-pay

## 2019-04-09 VITALS — BP 132/73 | HR 80 | Ht 72.0 in | Wt 224.0 lb

## 2019-04-09 DIAGNOSIS — R351 Nocturia: Secondary | ICD-10-CM

## 2019-04-09 DIAGNOSIS — Z125 Encounter for screening for malignant neoplasm of prostate: Secondary | ICD-10-CM | POA: Diagnosis not present

## 2019-04-09 NOTE — Progress Notes (Signed)
   04/09/2019 3:45 PM   Mark Mccarty [birth year] [id number]  Reason for visit: Follow up PSA screening  HPI: I saw Mark Mccarty back in urology clinic for follow-up of PSA screening.  He is a 68 year old male who was recently completed treatment for lung cancer, and is undergoing CT scans every 3 months for surveillance for routine PSA screening.  PSA is stable at 3.1 from 3.2 last year.  His primary urinary complaints are nocturia 4-5 times per night.  He denies any urinary symptoms during the day.  He denies any gross hematuria or flank pain.  He denies any history of UTIs or retention.  He has been on Flomax long-term by his PCP.   ROS: Please see flowsheet from today's date for complete review of systems.  Physical Exam: BP 132/73   Pulse 80   Ht 6' (1.829 m)   Wt 224 lb (101.6 kg)   BMI 30.38 kg/m    Constitutional:  Alert and oriented, No acute distress. Respiratory: Normal respiratory effort, no increased work of breathing. GI: Abdomen is soft, nontender, nondistended, no abdominal masses Skin: No rashes, bruises or suspicious lesions. Neurologic: Grossly intact, no focal deficits, moving all 4 extremities. Psychiatric: Normal mood and affect  Laboratory Data: PSA 04/2019: 3.1 04/2018: 3.2   Assessment & Plan:   In summary, he is a 68 year old male with history of lung cancer closely followed by oncology here for routine PSA screening and nocturia.  We discussed the AUA guidelines recommending PSA screening on a yearly basis up to age 67.  Regarding his nocturia, we discussed behavioral strategies at length including minimizing fluids in the evening, double voiding prior to bed, as well as the role of sleep apnea in patients with nocturia.  I strongly recommended evaluation for sleep apnea with his bothersome nocturia and no urinary symptoms during the day.  RTC 1 year with PVR and PSA   A total of 15 minutes were spent face-to-face with the patient, greater than  50% was spent in patient education, counseling, and coordination of care regarding PSA screening and nocturia.  Billey Co, [facility] [address] [place],  [address] [phone]

## 2019-04-09 NOTE — Patient Instructions (Addendum)
1. Minimize fluid intake after 7pm, urinate twice before bed 2. Recommend being evaluated for sleep apnea   Sleep Apnea Sleep apnea affects breathing during sleep. It causes breathing to stop for a short time or to become shallow. It can also increase the risk of:  Heart attack.  Stroke.  Being very overweight (obese).  Diabetes.  Heart failure.  Irregular heartbeat. The goal of treatment is to help you breathe normally again. What are the causes? There are three kinds of sleep apnea:  Obstructive sleep apnea. This is caused by a blocked or collapsed airway.  Central sleep apnea. This happens when the brain does not send the right signals to the muscles that control breathing.  Mixed sleep apnea. This is a combination of obstructive and central sleep apnea. The most common cause of this condition is a collapsed or blocked airway. This can happen if:  Your throat muscles are too relaxed.  Your tongue and tonsils are too large.  You are overweight.  Your airway is too small. What increases the risk?  Being overweight.  Smoking.  Having a small airway.  Being older.  Being male.  Drinking alcohol.  Taking medicines to calm yourself (sedatives or tranquilizers).  Having family members with the condition. What are the signs or symptoms?  Trouble staying asleep.  Being sleepy or tired during the day.  Getting angry a lot.  Loud snoring.  Headaches in the morning.  Not being able to focus your mind (concentrate).  Forgetting things.  Less interest in sex.  Mood swings.  Personality changes.  Feelings of sadness (depression).  Waking up a lot during the night to pee (urinate).  Dry mouth.  Sore throat. How is this diagnosed?  Your medical history.  A physical exam.  A test that is done when you are sleeping (sleep study). The test is most often done in a sleep lab but may also be done at home. How is this treated?   Sleeping on your  side.  Using a medicine to get rid of mucus in your nose (decongestant).  Avoiding the use of alcohol, medicines to help you relax, or certain pain medicines (narcotics).  Losing weight, if needed.  Changing your diet.  Not smoking.  Using a machine to open your airway while you sleep, such as: ? An oral appliance. This is a mouthpiece that shifts your lower jaw forward. ? A CPAP device. This device blows air through a mask when you breathe out (exhale). ? An EPAP device. This has valves that you put in each nostril. ? A BPAP device. This device blows air through a mask when you breathe in (inhale) and breathe out.  Having surgery if other treatments do not work. It is important to get treatment for sleep apnea. Without treatment, it can lead to:  High blood pressure.  Coronary artery disease.  In men, not being able to have an erection (impotence).  Reduced thinking ability. Follow these instructions at home: Lifestyle  Make changes that your doctor recommends.  Eat a healthy diet.  Lose weight if needed.  Avoid alcohol, medicines to help you relax, and some pain medicines.  Do not use any products that contain nicotine or tobacco, such as cigarettes, e-cigarettes, and chewing tobacco. If you need help quitting, ask your doctor. General instructions  Take over-the-counter and prescription medicines only as told by your doctor.  If you were given a machine to use while you sleep, use it only as told by  your doctor.  If you are having surgery, make sure to tell your doctor you have sleep apnea. You may need to bring your device with you.  Keep all follow-up visits as told by your doctor. This is important. Contact a doctor if:  The machine that you were given to use during sleep bothers you or does not seem to be working.  You do not get better.  You get worse. Get help right away if:  Your chest hurts.  You have trouble breathing in enough air.  You have  an uncomfortable feeling in your back, arms, or stomach.  You have trouble talking.  One side of your body feels weak.  A part of your face is hanging down. These symptoms may be an emergency. Do not wait to see if the symptoms will go away. Get medical help right away. Call your local emergency services (911 in the [place] Do not drive yourself to the hospital. Summary  This condition affects breathing during sleep.  The most common cause is a collapsed or blocked airway.  The goal of treatment is to help you breathe normally while you sleep. This information is not intended to replace advice given to you by your health care provider. Make sure you discuss any questions you have with your health care provider. Document Released: 03/27/2008 Document Revised: 04/04/2018 Document Reviewed: 02/11/2018 Elsevier Patient Education  2020 [employer].

## 2019-04-14 ENCOUNTER — Other Ambulatory Visit: Payer: Self-pay

## 2019-04-14 ENCOUNTER — Inpatient Hospital Stay: Payer: Medicare Other | Attending: Oncology

## 2019-04-14 DIAGNOSIS — C3491 Malignant neoplasm of unspecified part of right bronchus or lung: Secondary | ICD-10-CM

## 2019-04-14 DIAGNOSIS — E876 Hypokalemia: Secondary | ICD-10-CM | POA: Insufficient documentation

## 2019-04-14 LAB — CBC WITH DIFFERENTIAL/PLATELET
Abs Immature Granulocytes: 0.03 10*3/uL (ref 0.00–0.07)
Basophils Absolute: 0 10*3/uL (ref 0.0–0.1)
Basophils Relative: 0 %
Eosinophils Absolute: 0.1 10*3/uL (ref 0.0–0.5)
Eosinophils Relative: 2 %
HCT: 41.3 % (ref 39.0–52.0)
Hemoglobin: 14 g/dL (ref 13.0–17.0)
Immature Granulocytes: 0 %
Lymphocytes Relative: 15 %
Lymphs Abs: 1 10*3/uL (ref 0.7–4.0)
MCH: 28.1 pg (ref 26.0–34.0)
MCHC: 33.9 g/dL (ref 30.0–36.0)
MCV: 82.9 fL (ref 80.0–100.0)
Monocytes Absolute: 0.7 10*3/uL (ref 0.1–1.0)
Monocytes Relative: 11 %
Neutro Abs: 4.8 10*3/uL (ref 1.7–7.7)
Neutrophils Relative %: 72 %
Platelets: 267 10*3/uL (ref 150–400)
RBC: 4.98 MIL/uL (ref 4.22–5.81)
RDW: 13.4 % (ref 11.5–15.5)
WBC: 6.7 10*3/uL (ref 4.0–10.5)
nRBC: 0 % (ref 0.0–0.2)

## 2019-04-14 LAB — COMPREHENSIVE METABOLIC PANEL
ALT: 23 U/L (ref 0–44)
AST: 24 U/L (ref 15–41)
Albumin: 4 g/dL (ref 3.5–5.0)
Alkaline Phosphatase: 63 U/L (ref 38–126)
Anion gap: 9 (ref 5–15)
BUN: 13 mg/dL (ref 8–23)
CO2: 27 mmol/L (ref 22–32)
Calcium: 9.3 mg/dL (ref 8.9–10.3)
Chloride: 103 mmol/L (ref 98–111)
Creatinine, Ser: 0.98 mg/dL (ref 0.61–1.24)
GFR calc Af Amer: >60 mL/min (ref >60)
GFR calc non Af Amer: >60 mL/min (ref >60)
Glucose, Bld: 121 mg/dL — ABNORMAL HIGH (ref 70–99)
Potassium: 3.5 mmol/L (ref 3.5–5.1)
Sodium: 139 mmol/L (ref 135–145)
Total Bilirubin: 0.5 mg/dL (ref 0.3–1.2)
Total Protein: 7.2 g/dL (ref 6.5–8.1)

## 2019-05-11 ENCOUNTER — Other Ambulatory Visit: Payer: Self-pay

## 2019-05-11 ENCOUNTER — Ambulatory Visit
Admission: RE | Admit: 2019-05-11 | Discharge: 2019-05-11 | Disposition: A | Discharge status: Home / Self Care | Payer: Medicare Other | Source: Ambulatory Visit | Attending: Oncology | Admitting: Oncology

## 2019-05-11 ENCOUNTER — Encounter: Payer: Self-pay | Admitting: Oncology

## 2019-05-11 DIAGNOSIS — C3491 Malignant neoplasm of unspecified part of right bronchus or lung: Secondary | ICD-10-CM | POA: Insufficient documentation

## 2019-05-12 ENCOUNTER — Inpatient Hospital Stay: Payer: Medicare Other

## 2019-05-12 ENCOUNTER — Inpatient Hospital Stay (HOSPITAL_BASED_OUTPATIENT_CLINIC_OR_DEPARTMENT_OTHER): Payer: Medicare Other | Admitting: Oncology

## 2019-05-12 ENCOUNTER — Inpatient Hospital Stay: Payer: Medicare Other | Attending: Oncology

## 2019-05-12 ENCOUNTER — Other Ambulatory Visit: Payer: Self-pay

## 2019-05-12 VITALS — BP 143/81 | HR 70 | Temp 97.6°F | Resp 16 | Wt 229.0 lb

## 2019-05-12 DIAGNOSIS — Z803 Family history of malignant neoplasm of breast: Secondary | ICD-10-CM | POA: Insufficient documentation

## 2019-05-12 DIAGNOSIS — E876 Hypokalemia: Secondary | ICD-10-CM | POA: Diagnosis not present

## 2019-05-12 DIAGNOSIS — Z452 Encounter for adjustment and management of vascular access device: Secondary | ICD-10-CM | POA: Insufficient documentation

## 2019-05-12 DIAGNOSIS — Z8249 Family history of ischemic heart disease and other diseases of the circulatory system: Secondary | ICD-10-CM | POA: Insufficient documentation

## 2019-05-12 DIAGNOSIS — Z9221 Personal history of antineoplastic chemotherapy: Secondary | ICD-10-CM | POA: Diagnosis not present

## 2019-05-12 DIAGNOSIS — Z95828 Presence of other vascular implants and grafts: Secondary | ICD-10-CM

## 2019-05-12 DIAGNOSIS — C3491 Malignant neoplasm of unspecified part of right bronchus or lung: Secondary | ICD-10-CM

## 2019-05-12 DIAGNOSIS — J439 Emphysema, unspecified: Secondary | ICD-10-CM | POA: Diagnosis not present

## 2019-05-12 DIAGNOSIS — Z800 Family history of malignant neoplasm of digestive organs: Secondary | ICD-10-CM | POA: Insufficient documentation

## 2019-05-12 DIAGNOSIS — Z79899 Other long term (current) drug therapy: Secondary | ICD-10-CM | POA: Diagnosis not present

## 2019-05-12 DIAGNOSIS — I10 Essential (primary) hypertension: Secondary | ICD-10-CM | POA: Insufficient documentation

## 2019-05-12 DIAGNOSIS — Z87891 Personal history of nicotine dependence: Secondary | ICD-10-CM | POA: Insufficient documentation

## 2019-05-12 DIAGNOSIS — Z801 Family history of malignant neoplasm of trachea, bronchus and lung: Secondary | ICD-10-CM | POA: Insufficient documentation

## 2019-05-12 DIAGNOSIS — Z7982 Long term (current) use of aspirin: Secondary | ICD-10-CM | POA: Diagnosis not present

## 2019-05-12 DIAGNOSIS — Z791 Long term (current) use of non-steroidal anti-inflammatories (NSAID): Secondary | ICD-10-CM | POA: Insufficient documentation

## 2019-05-12 DIAGNOSIS — Z923 Personal history of irradiation: Secondary | ICD-10-CM | POA: Insufficient documentation

## 2019-05-12 LAB — COMPREHENSIVE METABOLIC PANEL
ALT: 23 U/L (ref 0–44)
AST: 23 U/L (ref 15–41)
Albumin: 4.2 g/dL (ref 3.5–5.0)
Alkaline Phosphatase: 65 U/L (ref 38–126)
Anion gap: 6 (ref 5–15)
BUN: 12 mg/dL (ref 8–23)
CO2: 29 mmol/L (ref 22–32)
Calcium: 8.8 mg/dL — ABNORMAL LOW (ref 8.9–10.3)
Chloride: 103 mmol/L (ref 98–111)
Creatinine, Ser: 0.79 mg/dL (ref 0.61–1.24)
GFR calc Af Amer: >60 mL/min (ref >60)
GFR calc non Af Amer: >60 mL/min (ref >60)
Glucose, Bld: 119 mg/dL — ABNORMAL HIGH (ref 70–99)
Potassium: 3.6 mmol/L (ref 3.5–5.1)
Sodium: 138 mmol/L (ref 135–145)
Total Bilirubin: 0.6 mg/dL (ref 0.3–1.2)
Total Protein: 7.6 g/dL (ref 6.5–8.1)

## 2019-05-12 LAB — CBC WITH DIFFERENTIAL/PLATELET
Abs Immature Granulocytes: 0.03 10*3/uL (ref 0.00–0.07)
Basophils Absolute: 0 10*3/uL (ref 0.0–0.1)
Basophils Relative: 0 %
Eosinophils Absolute: 0.2 10*3/uL (ref 0.0–0.5)
Eosinophils Relative: 3 %
HCT: 40.5 % (ref 39.0–52.0)
Hemoglobin: 13.8 g/dL (ref 13.0–17.0)
Immature Granulocytes: 1 %
Lymphocytes Relative: 13 %
Lymphs Abs: 0.8 10*3/uL (ref 0.7–4.0)
MCH: 28.3 pg (ref 26.0–34.0)
MCHC: 34.1 g/dL (ref 30.0–36.0)
MCV: 83 fL (ref 80.0–100.0)
Monocytes Absolute: 0.9 10*3/uL (ref 0.1–1.0)
Monocytes Relative: 14 %
Neutro Abs: 4.6 10*3/uL (ref 1.7–7.7)
Neutrophils Relative %: 69 %
Platelets: 265 10*3/uL (ref 150–400)
RBC: 4.88 MIL/uL (ref 4.22–5.81)
RDW: 13.7 % (ref 11.5–15.5)
WBC: 6.5 10*3/uL (ref 4.0–10.5)
nRBC: 0 % (ref 0.0–0.2)

## 2019-05-12 MED ORDER — SODIUM CHLORIDE 0.9% FLUSH
10.0000 mL | INTRAVENOUS | Status: DC | PRN
  Administered 2019-05-12: 10 mL via INTRAVENOUS
  Filled 2019-05-12: qty 10

## 2019-05-12 MED ORDER — HEPARIN SOD (PORK) LOCK FLUSH 100 UNIT/ML IV SOLN
500.0000 [IU] | Freq: Once | INTRAVENOUS | Status: AC
  Administered 2019-05-12: 500 [IU] via INTRAVENOUS
  Filled 2019-05-12: qty 5

## 2019-05-12 MED ORDER — POTASSIUM CHLORIDE CRYS ER 20 MEQ PO TBCR: 40.0000 meq | EXTENDED_RELEASE_TABLET | Freq: Every day | ORAL | 2 refills | Status: DC

## 2019-05-12 NOTE — Progress Notes (Signed)
Patient does not offer any problems today.  Medication reconciliation done with pre-screen for MD visit.

## 2019-05-12 NOTE — Progress Notes (Signed)
Hematology/Oncology Follow up note [facility] [phone] [phone]   Patient Care Team: [name], MD as PCP - General (Internal Medicine) [name], RN as Registered Nurse   REASON FOR VISIT Follow up for immunotherapy treatment for stage IIIA lung adenocarcinoma  Oncology History:  Mark Mccarty is a  68 y.o.  male with  adenocarcinoma of lung, diagnosed via biopsy of right hilar lymph nodes.  Former 43 pack year smoking history.  Presents for assessment prior to concurrent chemo T3 N1 M0 Status post concurrent chemoradiation.   03/17/2019 Finished 1 year of durvalumab treatment.  INTERVAL HISTORY Mark Mccarty is a 68 y.o. male who has above history reviewed by me presents for follow-up of stage III A non-small cell lung cancer. Patient reports doing well. Denies fever, chills, nausea, vomiting, diarrhea, chest pain, shortness of breath, abdominal pain, urinary symptoms, lower extremity swelling.  Appetite is good. Chronic shortness of breath at baseline. Patient takes potassium chloride 40 mEq daily.  . Review of Systems  Constitutional: Negative for appetite change, chills, fatigue, fever and unexpected weight change.  [name]:   Negative for hearing loss and voice change.   Eyes: Negative for eye problems and icterus.  Respiratory: Negative for chest tightness, cough and shortness of breath.   Cardiovascular: Negative for chest pain and leg swelling.  Gastrointestinal: Negative for abdominal distention and abdominal pain.  Endocrine: Negative for hot flashes.  Genitourinary: Negative for difficulty urinating, dysuria and frequency.   Musculoskeletal: Negative for arthralgias.  Skin: Negative for itching and rash.  Neurological: Negative for light-headedness and numbness.  [name]: Negative for adenopathy. Does not bruise/bleed easily.  Psychiatric/Behavioral: Negative for confusion.     MEDICAL HISTORY:   Past Medical History:  Diagnosis Date   Asthma    Cancer ([place])    lung   Cataract    left cataract surgery   Dyspnea    [name]    GERD (gastroesophageal reflux disease)    Hypertension    Neuromuscular disorder ([place])    pt has knees bilateral with tendon issues that cause pain    SURGICAL HISTORY: Past Surgical History:  Procedure Laterality Date   CARDIAC CATHETERIZATION N/A 12/16/2014   Procedure: Left Heart Cath;  Surgeon: [name], MD;  Location: [facility];  Service: Cardiovascular;  Laterality: N/A;   CYST REMOVAL TRUNK     chest and back over time and it was removed   ENDOBRONCHIAL ULTRASOUND N/A 12/09/2017   Procedure: ENDOBRONCHIAL ULTRASOUND;  Surgeon: [name], MD;  Location: [name];  Service: Pulmonary;  Laterality: N/A;   left cataract surgery     PORTA CATH INSERTION N/A 12/23/2017   Procedure: PORTA CATH INSERTION;  Surgeon: [name], MD;  Location: [place] [facility];  Service: Cardiovascular;  Laterality: N/A;    SOCIAL HISTORY: Social History   Socioeconomic History   Marital status: Divorced    Spouse name: Not on file   Number of children: Not on file   Years of education: Not on file   Highest education level: Not on file  Occupational History   Not on file  Social Needs   Financial resource strain: Not on file   Food insecurity    Worry: Not on file    Inability: Not on file   Transportation needs    Medical: Not on file    Non-medical: Not on file  Tobacco Use   Smoking status: Former Smoker  Packs/day: 1.50    Years: 35.00    Pack years: 52.50    Types: Cigarettes    Quit date: 06/22/2015    Years since quitting: 3.8   Smokeless tobacco: Never Used  Substance and Sexual Activity   Alcohol use: Yes    Comment: a fifth in a week   Drug use: No   Sexual activity: Not Currently  Lifestyle   Physical activity    Days per week: Not on file    Minutes per  session: Not on file   Stress: Not on file  Relationships   Social connections    Talks on phone: Not on file    Gets together: Not on file    Attends religious service: Not on file    Active member of club or organization: Not on file    Attends meetings of clubs or organizations: Not on file    Relationship status: Not on file   Intimate partner violence    Fear of current or ex partner: Not on file    Emotionally abused: Not on file    Physically abused: Not on file    Forced sexual activity: Not on file  Other Topics Concern   Not on file  Social History Narrative   Not on file    FAMILY HISTORY: Family History  Problem Relation Age of Onset   Hypertension Mother    Breast cancer Mother    Heart attack Mother    Lung cancer Father    Heart disease Sister    Diabetes Sister    Colon cancer Sister     ALLERGIES:  is allergic to penicillins.  MEDICATIONS:  Current Outpatient Medications  Medication Sig Dispense Refill   acetaminophen (TYLENOL) 500 MG tablet Take 500 mg by mouth as needed.     amLODipine (NORVASC) 10 MG tablet Take 10 mg by mouth daily.     aspirin 81 MG tablet Take 81 mg by mouth daily.     carvedilol (COREG) 25 MG tablet Take 25 mg by mouth 2 (two) times daily with a meal.     gabapentin (NEURONTIN) 300 MG capsule Take 300 mg by mouth at bedtime as needed.      ibuprofen (ADVIL,MOTRIN) 600 MG tablet Take 600 mg by mouth every 6 (six) hours as needed.     lidocaine-prilocaine (EMLA) cream Apply to port area once 30 g 3   lisinopril (PRINIVIL,ZESTRIL) 40 MG tablet Take 40 mg by mouth daily.     potassium chloride SA (KLOR-CON M20) 20 MEQ tablet Take 2 tablets (40 mEq total) by mouth daily. 60 tablet 2   pravastatin (PRAVACHOL) 20 MG tablet Take 20 mg by mouth at bedtime.     prochlorperazine (COMPAZINE) 10 MG tablet Take 1 tablet (10 mg total) by mouth every 6 (six) hours as needed (Nausea or vomiting). 30 tablet 1   senna  (SENOKOT) 8.6 MG TABS tablet Take 2 tablets (17.2 mg total) by mouth daily. 120 each 0   tamsulosin (FLOMAX) 0.4 MG CAPS capsule Take 0.4 mg by mouth daily.     albuterol (PROVENTIL HFA;VENTOLIN HFA) 108 (90 Base) MCG/ACT inhaler Inhale 2 puffs into the lungs every 6 (six) hours as needed.  12   ANORO ELLIPTA 62.5-25 MCG/INH AEPB TAKE 1 PUFF BY MOUTH EVERY DAY (Patient not taking: Reported on 05/11/2019) 180 each 3   fluticasone (FLONASE) 50 MCG/ACT nasal spray PLACE 1 SPRAY INTO BOTH NOSTRILS 2 (TWO) TIMES DAILY  0  No current facility-administered medications for this visit.    Facility-Administered Medications Ordered in Other Visits  Medication Dose Route Frequency Provider Last Rate Last Dose   sodium chloride flush (NS) 0.9 % injection 10 mL  10 mL Intravenous PRN [name], MD   10 mL at 03/18/18 0858   sodium chloride flush (NS) 0.9 % injection 10 mL  10 mL Intravenous PRN [name], MD   10 mL at 05/12/19 0833     PHYSICAL EXAMINATION: ECOG PERFORMANCE STATUS: 0 - Asymptomatic Vitals:   05/12/19 0840  BP: (!) 143/81  Pulse: 70  Resp: 16  Temp: 97.6 F (36.4 C)  SpO2: 98%   Filed Weights   05/12/19 0840  Weight: 229 lb (103.9 kg)    Physical Exam Constitutional:      General: He is not in acute distress. [name]:     Head: Normocephalic and atraumatic.  Eyes:     General: No scleral icterus.    Conjunctiva/sclera: Conjunctivae normal.     Pupils: Pupils are equal, round, and reactive to light.  Neck:     Musculoskeletal: Normal range of motion and neck supple.  Cardiovascular:     Rate and Rhythm: Normal rate and regular rhythm.     Heart sounds: Normal heart sounds.  Pulmonary:     Effort: Pulmonary effort is normal. No respiratory distress.     Breath sounds: No wheezing or rales.     Comments: Decreased breath sound bilaterally Chest:     Chest wall: No tenderness.  Abdominal:     General: Bowel sounds are normal. There is no distension.      Palpations: Abdomen is soft. There is no mass.     Tenderness: There is no abdominal tenderness.  Musculoskeletal: Normal range of motion.        General: No deformity.  Lymphadenopathy:     Cervical: No cervical adenopathy.  Skin:    General: Skin is warm and dry.     Findings: No erythema or rash.     Comments: Chest wall Mediport site no erythema or discharge. Left supraclavicular fossa fullness, chronic.  Neurological:     Mental Status: He is alert and oriented to person, place, and time.     Cranial Nerves: No cranial nerve deficit.     Coordination: Coordination normal.  Psychiatric:        Behavior: Behavior normal.        Thought Content: Thought content normal.      LABORATORY DATA:  I have reviewed the data as listed Lab Results  Component Value Date   WBC 6.5 05/12/2019   HGB 13.8 05/12/2019   HCT 40.5 05/12/2019   MCV 83.0 05/12/2019   PLT 265 05/12/2019   Recent Labs    03/17/19 0832 04/14/19 1121 05/12/19 0822  NA 137 139 138  K 3.5 3.5 3.6  CL 100 103 103  CO2 27 27 29   GLUCOSE 125* 121* 119*  BUN 11 13 12   CREATININE 0.79 0.98 0.79  CALCIUM 9.0 9.3 8.8*  GFRNONAA >60 >60 >60  GFRAA >60 >60 >60  PROT 7.3 7.2 7.6  ALBUMIN 4.1 4.0 4.2  AST 22 24 23   ALT 20 23 23   ALKPHOS 62 63 65  BILITOT 0.7 0.5 0.6   RADIOGRAPHIC STUDIES: I have personally reviewed the radiological images as listed and agreed with the findings in the report..  03/21/2018 CT chest showed mild decreasing size of the right upper lobe pulmonary nodule.  Interval decrease in size of right paratracheal and right hilar adenopathy.  06/30/2018 CT chest showed interval development of extensive changes secondary to external beam radiation within the posterior right upper lobe and superior segment of right lower lobe.  The enlarged right paratracheal lymph node is decreased in size in the interval.  Right upper lobe lung lesion is difficult to separate from surrounding changes due to  radiation but is favored to have decrease in size in the interval.  Aortic atherosclerosis and emphysema.  LAD coronary artery atherosclerotic calcification.  08/06/2018 CT head without contrast Brain parenchymal appears unremarkable there is no mass or hemorrhage.    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 Continued further decrease in the size of previously identified right.  Tracheal lymph node. Aortic atherosclerosis.  Emphysema.  Ct Chest Wo Contrast  Result Date: 05/11/2019 CLINICAL DATA:  Right-sided lung cancer. Adenocarcinoma of lung diagnosed by right hilar lymph nodes. Status post chemotherapy. EXAM: CT CHEST WITHOUT CONTRAST TECHNIQUE: Multidetector CT imaging of the chest was performed following the standard protocol without IV contrast. COMPARISON:  01/16/2019 FINDINGS: Cardiovascular: Left Port-A-Cath tip low SVC. Mild cardiomegaly, without pericardial effusion. Lad coronary artery atherosclerosis. Aortic atherosclerosis. Tortuous thoracic aorta. Pulmonary artery enlargement, outflow tract 3.2 cm. Mediastinum/Nodes: No supraclavicular adenopathy. 9 mm right paratracheal node on 54/2, similar. A node within the azygoesophageal recess measures 9 mm on 77/2 is not significantly changed. The right hilum is similarly prominent, at least partially felt to be due to an enlarged pulmonary artery branch. Not well evaluated on this noncontrast exam. Example image 65/2. Prevascular nodes of up to 8 mm on 59/2, similar. Lungs/Pleura: No pleural fluid. Moderate to marked bullous type emphysema. Minimal motion degradation, especially in the upper chest. Presumed secretions in the left mainstem bronchus. Presumed scarring in the posterior left upper lobe, similar. Similar configuration of right paramediastinal and right hilar consolidation and traction bronchiectasis consistent with radiation  fibrosis. No findings of parenchymal residual or recurrent disease. Upper Abdomen: Normal imaged portions of the liver, spleen, stomach, pancreas, gallbladder, adrenal glands, kidneys. Musculoskeletal: Presumed sebaceous cyst about the upper right chest wall on 13/2, similar. There is also a right paramidline area of subcutaneous soft tissue thickening which was present on the prior exam, but demonstrates new gas including on 09/02. The area of soft tissue thickening measures 2.9 cm moderate midthoracic spondylosis. Thoracic vertebral hemangiomas and a presumed bone island. IMPRESSION: 1. Similar appearance of right paramediastinal and perihilar radiation fibrosis. 2. Similar upper normal sized thoracic nodes. No evidence of new or progressive disease. 3. Similar right hilar soft tissue fullness, likely due to prior therapy and pulmonary artery enlargement. Not well evaluated on this noncontrast exam. Recommend attention on follow-up. 4. Pulmonary artery enlargement suggests pulmonary arterial hypertension. 5. Aortic atherosclerosis (ICD10-I70.0), coronary artery atherosclerosis and emphysema (ICD10-J43.9). 6. Probable sebaceous cysts about the chest both anterior and posteriorly. The posterior chest wall presumed sebaceous cyst demonstrates new heterogeneity and gas. Correlate with recent instrumentation. Electronically Signed   By: Abigail Miyamoto M.D.   On: 05/11/2019 11:00    ASSESSMENT & PLAN:  1. Primary lung adenocarcinoma, right ([place])   2. Hypokalemia   3. Port-A-Cath in place    Stage IIIA T3 N1 M0 lung adenocarcinoma, s/p concurrent chemoradiation.  Finished 1 year of durvalumab treatments. Patient's been doing well clinically.  Labs reviewed and discussed with patient.  CT scan was independently  reviewed by me and discussed with patient. Stable disease.  No evidence of recurrence or metastatic disease. We will plan to obtain CT chest with contrast in 3 months to better evaluate right hilum  area.  Hypokalemia, chronic.  Electrolyte wasting.  Labs reviewed.  Potassium is stable at 3.6 Recommend patient continue potassium 40 mEq daily.  Prescription sent to pharmacy  Port-A-Cath in place, continue port flush every 6 to 8 weeks We spent sufficient time to discuss many aspect of care, questions were answered to patient's satisfaction.  Return visit, lab MD 3 months, after CT scan. We spent sufficient time to discuss many aspect of care, questions were answered to patient's satisfaction. Total face to face encounter time for this patient visit was 25 min. >50% of the time was  spent in counseling and coordination of care.     [name], MD, PhD

## 2019-06-16 ENCOUNTER — Other Ambulatory Visit: Payer: Self-pay | Admitting: Physical Medicine and Rehabilitation

## 2019-06-16 DIAGNOSIS — M5416 Radiculopathy, lumbar region: Secondary | ICD-10-CM

## 2019-06-22 ENCOUNTER — Other Ambulatory Visit: Payer: Self-pay

## 2019-06-23 ENCOUNTER — Other Ambulatory Visit: Payer: Self-pay

## 2019-06-23 ENCOUNTER — Inpatient Hospital Stay: Payer: Medicare Other | Attending: Oncology

## 2019-06-23 DIAGNOSIS — Z452 Encounter for adjustment and management of vascular access device: Secondary | ICD-10-CM | POA: Diagnosis present

## 2019-06-23 DIAGNOSIS — Z95828 Presence of other vascular implants and grafts: Secondary | ICD-10-CM

## 2019-06-23 DIAGNOSIS — C3491 Malignant neoplasm of unspecified part of right bronchus or lung: Secondary | ICD-10-CM | POA: Diagnosis present

## 2019-06-23 MED ORDER — SODIUM CHLORIDE 0.9% FLUSH
10.0000 mL | Freq: Once | INTRAVENOUS | Status: AC
  Administered 2019-06-23: 10 mL via INTRAVENOUS
  Filled 2019-06-23: qty 10

## 2019-06-23 MED ORDER — HEPARIN SOD (PORK) LOCK FLUSH 100 UNIT/ML IV SOLN
500.0000 [IU] | Freq: Once | INTRAVENOUS | Status: AC
  Administered 2019-06-23: 13:00:00 500 [IU] via INTRAVENOUS
  Filled 2019-06-23: qty 5

## 2019-06-29 ENCOUNTER — Telehealth: Payer: Self-pay

## 2019-06-29 NOTE — Telephone Encounter (Signed)
Telephone call to patient to discuss SCP visit.  Patient in agreement for me to mail him the [facility] packet and I will call him on 07/13/19 at 2:00 to review SCP packet.   Packet mailed.

## 2019-06-30 ENCOUNTER — Other Ambulatory Visit: Payer: Self-pay

## 2019-06-30 ENCOUNTER — Ambulatory Visit
Admission: RE | Admit: 2019-06-30 | Discharge: 2019-06-30 | Disposition: A | Discharge status: Home / Self Care | Payer: Medicare Other | Source: Ambulatory Visit | Attending: Physical Medicine and Rehabilitation | Admitting: Physical Medicine and Rehabilitation

## 2019-06-30 DIAGNOSIS — M5416 Radiculopathy, lumbar region: Secondary | ICD-10-CM

## 2019-07-13 ENCOUNTER — Other Ambulatory Visit: Payer: Self-pay

## 2019-07-13 ENCOUNTER — Inpatient Hospital Stay: Payer: Medicare Other | Attending: Oncology | Admitting: Nurse Practitioner

## 2019-07-13 DIAGNOSIS — C3491 Malignant neoplasm of unspecified part of right bronchus or lung: Secondary | ICD-10-CM

## 2019-07-13 NOTE — Progress Notes (Signed)
Survivorship Care Plan visit completed.  Treatment summary reviewed and given to patient.  AS[facility] answers booklet reviewed and previously mailed to patient.  CARE program and Cancer Transitions discussed with patient along with other resources cancer center offers to patients and caregivers.  Patient verbalized understanding.

## 2019-07-13 NOTE — Progress Notes (Signed)
Virtual Visit Progress Note  Survivorship Clinic Consult note [facility]  [phone] [phone]  Patient Care Team: [name], MD as PCP - General (Internal Medicine) [name], RN as Registered Nurse [name], MD as Consulting Physician (Oncology) [name], MD as Referring Physician (Radiation Oncology) [name], MD as Referring Physician (Vascular Surgery)   Name of the patient: Mark Mccarty  [id number]  December 25, 1950   Date of visit: 07/13/19  CLINIC:  Survivorship  I connected with Cleora Fleet on 07/13/19 at  2:00 PM EST by telephone visit and verified that I am speaking with the correct person using two identifiers.   I discussed the limitations, risks, security and privacy concerns of performing an evaluation and management service by telemedicine and the availability of in-person appointments. I also discussed with the patient that there may be a patient responsible charge related to this service. The patient expressed understanding and agreed to proceed.   Other persons participating in the visit and their role in the encounter: Magdalene Patricia, RN (review Survivorship Care Plan)  Patient's location: home Provider's location: clinic  Chief Complaint: Review Survivorship Care Plan and address acute survivorship needs  REASON FOR VISIT:  Survivorship Care Plan visit & to address acute survivorship needs   BRIEF ONCOLOGY HISTORY: Patient initially presented a 69 year old male referred from pulmonology for evaluation of lung mass.  He was a former smoker at that time and had quit 2 years ago with approximately 43-pack-year history.  On imaging, 10/30/2017 CT chest without contrast showed irregular right upper lobe nodule measuring 2.6 x 2.1 cm touching pleural surface.  Additionally, right hilar lymph node and right paratracheal ipsilateral nodal metastases.  PET scan showed hypermetabolic right upper lobe nodule  abutting the pleural surface most consistent with primary bronchogenic carcinoma, ipsilateral right hilar and mediastinal metastatic adenopathy.  2 irregular nodules in the left upper lobe which have very, very mild hypermetabolic activity but are too small to characterize and return remained indeterminate.  MRI of the brain for staging was negative. Pathology:  A. LYMPH NODE, RIGHT PARATRACHEAL; EBUS-GUIDED FINE-NEEDLE ASPIRATION: - METASTATIC ADENOCARCINOMA A. RIGHT HILAR LYMPH NODE; EBUS GUIDED FINE-NEEDLE ASPIRATION: - METASTATIC ADENOCARCINOMA, A. LUNG, RIGHT UPPER LOBE; BRONCHIAL LAVAGE:  - RARE ATYPICAL CELLS SEEN IN A BACKGROUND OF REACTIVE BRONCHIAL EPITHELIUM AND MACROPHAGES A. LUNG, RIGHT UPPER LOBE; BRONCHIAL LAVAGE:  - SCANT ATYPICAL CELLS SEEN IN A BACKGROUND OF REACTIVE BRONCHIAL EPITHELIUM AND MACROPHAGES  Stage IIIa, T3 N1 M0 lung adenocarcinoma. Recommendation was for concurrent chemotherapy and radiation followed by maintenance durvalumab. He received carbo-Taxol 12/31/2017 - 02/18/18 & radiation followed by maintenance durvalumab which he completed 03/17/2019.    Post treatment Imaging showed stable disease with no evidence of recurrence or metastatic disease.  Oncology History  Primary lung adenocarcinoma, right ([place])  12/13/2017 Initial Diagnosis   Primary lung adenocarcinoma, right ([place])   12/13/2017 Cancer Staging   Staging form: Lung, AJCC 8th Edition - Clinical stage from 12/13/2017: Stage IIIA (cT3, cN1, cM0) - Signed by [name], MD on 12/13/2017   12/31/2017 - 02/25/2018 Chemotherapy   The patient had palonosetron (ALOXI) injection 0.25 mg, 0.25 mg, Intravenous,  Once, 8 of 8 cycles Administration: 0.25 mg (12/31/2017), 0.25 mg (01/28/2018), 0.25 mg (02/04/2018), 0.25 mg (02/13/2018), 0.25 mg (01/07/2018), 0.25 mg (01/14/2018), 0.25 mg (01/21/2018), 0.25 mg (02/19/2018) CARBOplatin (PARAPLATIN) 240 mg in sodium chloride 0.9 % 250 mL chemo infusion, 240 mg (100 % of original dose  242 mg),  Intravenous,  Once, 8 of 8 cycles Dose modification:   (original dose 242 mg, Cycle 1) Administration: 240 mg (12/31/2017), 240 mg (01/28/2018), 240 mg (02/04/2018), 240 mg (02/13/2018), 240 mg (01/07/2018), 240 mg (01/14/2018), 240 mg (01/21/2018), 240 mg (02/19/2018) PACLitaxel (TAXOL) 96 mg in sodium chloride 0.9 % 250 mL chemo infusion (</= 80mg /m2), 45 mg/m2 = 96 mg, Intravenous,  Once, 8 of 8 cycles Administration: 96 mg (12/31/2017), 96 mg (01/28/2018), 96 mg (02/04/2018), 96 mg (02/13/2018), 96 mg (01/07/2018), 96 mg (01/14/2018), 96 mg (01/21/2018), 96 mg (02/19/2018)  for chemotherapy treatment.    03/24/2018 -  Chemotherapy   The patient had durvalumab (IMFINZI) 960 mg in sodium chloride 0.9 % 100 mL chemo infusion, 10 mg/kg = 960 mg, Intravenous,  Once, 26 of 26 cycles Administration: 960 mg (06/18/2018), 960 mg (07/22/2018), 960 mg (07/04/2018), 960 mg (08/05/2018), 960 mg (08/19/2018), 960 mg (09/02/2018), 960 mg (04/08/2018), 960 mg (04/22/2018), 960 mg (05/06/2018), 960 mg (05/21/2018), 960 mg (06/04/2018), 960 mg (09/16/2018), 960 mg (09/30/2018), 960 mg (11/11/2018), 960 mg (10/14/2018), 960 mg (10/28/2018), 960 mg (11/25/2018), 960 mg (12/09/2018), 960 mg (12/23/2018), 960 mg (01/06/2019), 960 mg (01/20/2019), 960 mg (02/17/2019), 960 mg (03/03/2019), 980 mg (03/17/2019)  for chemotherapy treatment.       INTERVAL HISTORY: Patient presents to the survivorship clinic today for initial meeting to review survivorship care plan detailing treatment course for lung cancer, as well as monitoring long-term side effects of that treatment, education regarding health maintenance, screening, and overall wellness and health promotion.  Overall, he reports feeling well since completing treatment. He had a sebaceous cyst on his back that was lanced prior to starting chemotherapy. Healing delayed, approximately 8 months. Has occasional pain that radiates to shoulder and hand. He says that fatigue has resolved and he exercises at home.   Port was placed for administration of chemotherapy which remains in place today.  He has suffered chronic hypokalemia throughout treatment and continues potassium replacement.    Past medical, surgical, social history were reviewed and updated as below.  We also updated patient's current medications and allergies.  ADDITIONAL REVIEW OF SYSTEMS:  Review of Systems  Constitutional: Negative for chills, fever, malaise/fatigue and weight loss.  [name]: Negative for hearing loss, nosebleeds, sore throat and tinnitus.   Eyes: Negative for blurred vision and double vision.  Respiratory: Negative for cough, hemoptysis, shortness of breath and wheezing.   Cardiovascular: Negative for chest pain, palpitations and leg swelling.  Gastrointestinal: Negative for abdominal pain, blood in stool, constipation, diarrhea, melena, nausea and vomiting.  Genitourinary: Negative for dysuria and urgency.  Musculoskeletal: Negative for back pain, falls, joint pain and myalgias.  Skin: Negative for itching and rash.       cyst  Neurological: Negative for dizziness, tingling, sensory change, loss of consciousness, weakness and headaches.  Endo/Heme/Allergies: Negative for environmental allergies. Does not bruise/bleed easily.  Psychiatric/Behavioral: Negative for depression. The patient is not nervous/anxious and does not have insomnia.     PAST MEDICAL & SURGICAL HISTORY:  Past Medical History:  Diagnosis Date  . Asthma   . Cancer ([place])    lung  . Cataract    left cataract surgery  . Dyspnea   . [name]   . GERD (gastroesophageal reflux disease)   . Hypertension   . Neuromuscular disorder ([place])    pt has knees bilateral with tendon issues that cause pain   Past Surgical History:  Procedure Laterality Date  . CARDIAC CATHETERIZATION N/A 12/16/2014   Procedure:  Left Heart Cath;  Surgeon: [name], MD;  Location: [place] [facility];  Service: Cardiovascular;  Laterality: N/A;  . CYST REMOVAL  TRUNK     chest and back over time and it was removed  . ENDOBRONCHIAL ULTRASOUND N/A 12/09/2017   Procedure: ENDOBRONCHIAL ULTRASOUND;  Surgeon: [name], MD;  Location: [name];  Service: Pulmonary;  Laterality: N/A;  . left cataract surgery    . PORTA CATH INSERTION N/A 12/23/2017   Procedure: PORTA CATH INSERTION;  Surgeon: [name], MD;  Location: [place] [facility];  Service: Cardiovascular;  Laterality: N/A;    SOCIAL HISTORY:  Social History   Socioeconomic History  . Marital status: Divorced    Spouse name: Not on file  . Number of children: Not on file  . Years of education: Not on file  . Highest education level: Not on file  Occupational History  . Not on file  Tobacco Use  . Smoking status: Former Smoker    Packs/day: 1.50    Years: 35.00    Pack years: 52.50    Types: Cigarettes    Quit date: 06/22/2015    Years since quitting: 4.0  . Smokeless tobacco: Never Used  Substance and Sexual Activity  . Alcohol use: Yes    Comment: a fifth in a week  . Drug use: No  . Sexual activity: Not Currently  Other Topics Concern  . Not on file  Social History Narrative  . Not on file   Social Determinants of Health   Financial Resource Strain:   . Difficulty of Paying Living Expenses: Not on file  Food Insecurity:   . Worried About Charity fundraiser in the Last Year: Not on file  . Ran Out of Food in the Last Year: Not on file  Transportation Needs:   . Lack of Transportation (Medical): Not on file  . Lack of Transportation (Non-Medical): Not on file  Physical Activity:   . Days of Exercise per Week: Not on file  . Minutes of Exercise per Session: Not on file  Stress:   . Feeling of Stress : Not on file  Social Connections:   . Frequency of Communication with Friends and Family: Not on file  . Frequency of Social Gatherings with Friends and Family: Not on file  . Attends Religious Services: Not on file  . Active Member of Clubs or  Organizations: Not on file  . Attends Archivist Meetings: Not on file  . Marital Status: Not on file    CURRENT MEDICATIONS:  Current Outpatient Medications on File Prior to Visit  Medication Sig Dispense Refill  . acetaminophen (TYLENOL) 500 MG tablet Take 500 mg by mouth as needed.    [name] albuterol (PROVENTIL HFA;VENTOLIN HFA) 108 (90 Base) MCG/ACT inhaler Inhale 2 puffs into the lungs every 6 (six) hours as needed.  12  . amLODipine (NORVASC) 10 MG tablet Take 10 mg by mouth daily.    Jearl Klinefelter ELLIPTA 62.5-25 MCG/INH AEPB TAKE 1 PUFF BY MOUTH EVERY DAY (Patient not taking: Reported on 05/11/2019) 180 each 3  . aspirin 81 MG tablet Take 81 mg by mouth daily.    . carvedilol (COREG) 25 MG tablet Take 25 mg by mouth 2 (two) times daily with a meal.    . fluticasone (FLONASE) 50 MCG/ACT nasal spray PLACE 1 SPRAY INTO BOTH NOSTRILS 2 (TWO) TIMES DAILY  0  . gabapentin (NEURONTIN) 300 MG capsule Take 300 mg by mouth  at bedtime as needed.     [name] ibuprofen (ADVIL,MOTRIN) 600 MG tablet Take 600 mg by mouth every 6 (six) hours as needed.    . lidocaine-prilocaine (EMLA) cream Apply to port area once 30 g 3  . lisinopril (PRINIVIL,ZESTRIL) 40 MG tablet Take 40 mg by mouth daily.    . potassium chloride SA (KLOR-CON M20) 20 MEQ tablet Take 2 tablets (40 mEq total) by mouth daily. 60 tablet 2  . pravastatin (PRAVACHOL) 20 MG tablet Take 20 mg by mouth at bedtime.    . prochlorperazine (COMPAZINE) 10 MG tablet Take 1 tablet (10 mg total) by mouth every 6 (six) hours as needed (Nausea or vomiting). 30 tablet 1  . senna (SENOKOT) 8.6 MG TABS tablet Take 2 tablets (17.2 mg total) by mouth daily. 120 each 0  . tamsulosin (FLOMAX) 0.4 MG CAPS capsule Take 0.4 mg by mouth daily.     Current Facility-Administered Medications on File Prior to Visit  Medication Dose Route Frequency Provider Last Rate Last Admin  . sodium chloride flush (NS) 0.9 % injection 10 mL  10 mL Intravenous PRN [name], MD   10  mL at 03/18/18 0858    ALLERGIES: Allergies  Allergen Reactions  . Penicillins Rash    Stomach hurt Has patient had a PCN reaction causing immediate rash, facial/tongue/throat swelling, SOB or lightheadedness with hypotension: yes Has patient had a PCN reaction causing severe rash involving mucus membranes or skin necrosis: no Has patient had a PCN reaction that required hospitalization: no Has patient had a PCN reaction occurring within the last 10 years: yes If all of the above answers are "NO", then may proceed with Cephalosporin use.     PHYSICAL EXAM:  Exam limited due to telemedicine  LABORATORY DATA:  None for this visit. Prior lab data reviewed per history.   IMAGING:  None at this visit. Prior imaging reviewed per history.   ASSESSMENT & PLAN:  Mr. Quast is a pleasant 69 y.o. male diagnosed with stage IIIa adenocarcinoma of the lung treated with concurrent chemotherapy and radiation followed by 1 year of maintenance durvalumab completed 03/17/2019.  Patient presents to survivorship clinic today for review of survivorship care plan and treatment summary and to address any acute survivorship concerns since completing treatment.  1. Stage IIIA lung cancer: Today, he received a copy of his [facility]) document, which was reviewed with him in detail.  The SCP details his cancer treatment history and potential late/long-term side effects of those treatments.  We discussed the follow-up schedule he can anticipate with interval imaging for surveillance of his cancer.  I have also shared a copy of his treatment summary/SCP with his PCP.  We discussed the NCCN guidelines for surveillance of Stage III disease includes history and physical and chest CT +/- contrast every 3-6 months for 3 years then history and physical and chest CT +/-contrast every 6 months for 2 years then history and physical and low-dose noncontrast enhanced chest CT annually thereafter.  I advised  that patients with residual or new radiographic abnormalities may require more frequent imaging/follow-up.   2. Port-a-cath: Port-A-Cath was placed for administration of chemotherapy and remains in place today.  We discussed need for regular flushing of port which is generally every 6 to 8 weeks but due to COVID-19 pandemic has been pushed every 12 weeks.  Port flushes will be coordinated through his regular medical oncology team.  Discussed that traditionally, patients will keep their port for  1 year following completion of treatment however, bothersome/preference we can discuss removing sooner.  He would like to keep his port in place at this time.  3. Hypokalemia-patient suffered low potassium levels during treatment.  Likely secondary to poor oral intake.  He continues potassium supplementation today.  We discussed potassium rich foods and I encouraged him to increase intake.  Primary medical oncology team will continue to monitor blood work and if levels normalize, may consider stopping potassium supplementation.  4.  Constipation-chronic and ongoing problem for patient even prior to chemotherapy.  I encouraged lifestyle modifications including eating a diet rich in fiber, ample fluid intake, and increasing physical activity.  He is stable on MiraLAX and senna.  5. Cyst- history of sebaceous cysts and suspect he may have a recurrence though unable to discern given telemedicine. I encouraged him to see a dermatologist for evaluation and management.  He is agreeable with this and he will call to get an appointment today says he does not require referral  6.  Tobacco avoidance-patient is a former smoker and I commended his continued efforts to remain tobacco free.  We discussed that this is one of the most important risk reduction strategies in preventing cancer recurrence and has overall health benefits.  Patient is committed to abstaining from tobacco.  7. Physical activity/Healthy eating: Getting  adequate physical activity and maintaining a healthy diet as a cancer survivor is important for overall wellness and reduces the risk of cancer recurrence. We discussed the CARE program, which is a fitness program that is offered to cancer survivors free of charge.  We also reviewed "The Nutrition Rainbow" handout, as well as the American Cancer Society's booklet with recommendations for nutrition and physical activity.    8. Health promotion/Cancer screening:  Mr. Breshears is not currently up-to-date on his cancer screening tests and vaccinations. I encouraged him to talk with PCP about arranging appropriate cancer screening tests, as appropriate.  Specifically, he has not had a skin cancer evaluation and colonoscopy is not up-to-date.  He has not had a shingles vaccine but believes this was previously ordered by his doctor.  I encouraged him to reach out to his pharmacy to see if he still has a valid order and otherwise follow-up with his PCP.  I encouraged him to get the COVID-19 vaccine when available.  He is reportedly up-to-date on flu and pneumonia vaccines  9. Support services/Counseling: It is not uncommon for this period of the patient's cancer care trajectory to be one of many emotions and stressors.  Mr. Ang was encouraged to take advantage of our many support services programs, support groups, and/or counseling in coping with her new life as a cancer survivor after completing anti-cancer treatment. We specifically discussed the Cancer Transitions program. He was given a calendar of the cancer center's support services events, as well as brochures for our spiritual care and free counseling resources.    Disposition/Return to clinic  -Return to survivorship clinic as needed; no additional follow-up needed at this time.   I discussed the assessment and treatment plan with the patient. The patient was provided an opportunity to ask questions and all were answered. The patient agreed with the  plan and demonstrated an understanding of the instructions.   The patient was advised to call back or seek an in-person evaluation if the symptoms worsen or if the condition fails to improve as anticipated.   A total of 47 minutes of nonface-to-face telephone visit time during this encounter, >  50% was spent counseling as documented under my assessment and plan.  Beckey Rutter, DNP, AGNP-C [place] [facility] [phone] (clinic)  CC: Dr. Tasia Catchings & Dr. Raechel Ache

## 2019-07-16 ENCOUNTER — Encounter: Payer: Self-pay | Admitting: Nurse Practitioner

## 2019-07-27 DIAGNOSIS — C3491 Malignant neoplasm of unspecified part of right bronchus or lung: Secondary | ICD-10-CM

## 2019-08-04 DIAGNOSIS — E669 Obesity, unspecified: Secondary | ICD-10-CM | POA: Insufficient documentation

## 2019-08-04 DIAGNOSIS — E66811 Obesity, class 1: Secondary | ICD-10-CM | POA: Insufficient documentation

## 2019-08-04 DIAGNOSIS — M5136 Other intervertebral disc degeneration, lumbar region: Secondary | ICD-10-CM | POA: Insufficient documentation

## 2019-08-07 ENCOUNTER — Other Ambulatory Visit: Payer: Medicare Other

## 2019-08-10 ENCOUNTER — Ambulatory Visit: Payer: Medicare Other | Admitting: Oncology

## 2019-08-10 ENCOUNTER — Other Ambulatory Visit: Payer: Medicare Other

## 2019-08-13 ENCOUNTER — Other Ambulatory Visit: Payer: Self-pay

## 2019-08-13 ENCOUNTER — Telehealth: Payer: Self-pay | Admitting: *Deleted

## 2019-08-13 DIAGNOSIS — C3491 Malignant neoplasm of unspecified part of right bronchus or lung: Secondary | ICD-10-CM

## 2019-08-13 NOTE — Telephone Encounter (Signed)
Creatinine order has been entered.

## 2019-08-13 NOTE — Telephone Encounter (Signed)
[facility] [place] called stating that they need an order for a creatinine to be drawn before he has his CT. Please call them back and enter order for Creatinine [phone]

## 2019-08-14 ENCOUNTER — Other Ambulatory Visit
Admission: RE | Admit: 2019-08-14 | Discharge: 2019-08-14 | Disposition: A | Discharge status: Home / Self Care | Payer: Medicare Other | Source: Ambulatory Visit | Attending: Oncology | Admitting: Oncology

## 2019-08-14 ENCOUNTER — Other Ambulatory Visit: Payer: Self-pay

## 2019-08-14 ENCOUNTER — Ambulatory Visit
Admission: RE | Admit: 2019-08-14 | Discharge: 2019-08-14 | Disposition: A | Discharge status: Home / Self Care | Payer: Medicare Other | Source: Ambulatory Visit | Attending: Oncology | Admitting: Oncology

## 2019-08-14 ENCOUNTER — Encounter (INDEPENDENT_AMBULATORY_CARE_PROVIDER_SITE_OTHER): Payer: Self-pay

## 2019-08-14 DIAGNOSIS — C3491 Malignant neoplasm of unspecified part of right bronchus or lung: Secondary | ICD-10-CM

## 2019-08-14 LAB — CREATININE, SERUM
Creatinine, Ser: 0.84 mg/dL (ref 0.61–1.24)
GFR calc Af Amer: >60 mL/min (ref >60)
GFR calc non Af Amer: >60 mL/min (ref >60)

## 2019-08-14 MED ORDER — IOHEXOL 300 MG/ML  SOLN
75.0000 mL | Freq: Once | INTRAMUSCULAR | Status: AC | PRN
  Administered 2019-08-14: 11:00:00 75 mL via INTRAVENOUS

## 2019-08-18 ENCOUNTER — Inpatient Hospital Stay: Payer: Medicare Other | Attending: Oncology | Admitting: Oncology

## 2019-08-18 ENCOUNTER — Other Ambulatory Visit: Payer: Self-pay

## 2019-08-18 ENCOUNTER — Inpatient Hospital Stay: Payer: Medicare Other

## 2019-08-18 ENCOUNTER — Encounter: Payer: Self-pay | Admitting: Oncology

## 2019-08-18 VITALS — BP 149/85 | HR 73 | Temp 97.4°F | Resp 16 | Wt 223.6 lb

## 2019-08-18 DIAGNOSIS — Z803 Family history of malignant neoplasm of breast: Secondary | ICD-10-CM | POA: Insufficient documentation

## 2019-08-18 DIAGNOSIS — Z95828 Presence of other vascular implants and grafts: Secondary | ICD-10-CM

## 2019-08-18 DIAGNOSIS — Z801 Family history of malignant neoplasm of trachea, bronchus and lung: Secondary | ICD-10-CM | POA: Insufficient documentation

## 2019-08-18 DIAGNOSIS — R519 Headache, unspecified: Secondary | ICD-10-CM | POA: Diagnosis not present

## 2019-08-18 DIAGNOSIS — C3491 Malignant neoplasm of unspecified part of right bronchus or lung: Secondary | ICD-10-CM | POA: Diagnosis present

## 2019-08-18 DIAGNOSIS — Z7982 Long term (current) use of aspirin: Secondary | ICD-10-CM | POA: Insufficient documentation

## 2019-08-18 DIAGNOSIS — Z9221 Personal history of antineoplastic chemotherapy: Secondary | ICD-10-CM | POA: Diagnosis not present

## 2019-08-18 DIAGNOSIS — J45909 Unspecified asthma, uncomplicated: Secondary | ICD-10-CM | POA: Diagnosis not present

## 2019-08-18 DIAGNOSIS — Z87891 Personal history of nicotine dependence: Secondary | ICD-10-CM | POA: Diagnosis not present

## 2019-08-18 DIAGNOSIS — Z79899 Other long term (current) drug therapy: Secondary | ICD-10-CM | POA: Diagnosis not present

## 2019-08-18 DIAGNOSIS — E876 Hypokalemia: Secondary | ICD-10-CM | POA: Diagnosis not present

## 2019-08-18 DIAGNOSIS — Z791 Long term (current) use of non-steroidal anti-inflammatories (NSAID): Secondary | ICD-10-CM | POA: Insufficient documentation

## 2019-08-18 DIAGNOSIS — Z923 Personal history of irradiation: Secondary | ICD-10-CM | POA: Diagnosis not present

## 2019-08-18 LAB — COMPREHENSIVE METABOLIC PANEL
ALT: 19 U/L (ref 0–44)
AST: 22 U/L (ref 15–41)
Albumin: 4.2 g/dL (ref 3.5–5.0)
Alkaline Phosphatase: 62 U/L (ref 38–126)
Anion gap: 10 (ref 5–15)
BUN: 12 mg/dL (ref 8–23)
CO2: 28 mmol/L (ref 22–32)
Calcium: 9 mg/dL (ref 8.9–10.3)
Chloride: 102 mmol/L (ref 98–111)
Creatinine, Ser: 0.89 mg/dL (ref 0.61–1.24)
GFR calc Af Amer: >60 mL/min (ref >60)
GFR calc non Af Amer: >60 mL/min (ref >60)
Glucose, Bld: 123 mg/dL — ABNORMAL HIGH (ref 70–99)
Potassium: 3.5 mmol/L (ref 3.5–5.1)
Sodium: 140 mmol/L (ref 135–145)
Total Bilirubin: 0.6 mg/dL (ref 0.3–1.2)
Total Protein: 7.5 g/dL (ref 6.5–8.1)

## 2019-08-18 LAB — CBC WITH DIFFERENTIAL/PLATELET
Abs Immature Granulocytes: 0.02 10*3/uL (ref 0.00–0.07)
Basophils Absolute: 0 10*3/uL (ref 0.0–0.1)
Basophils Relative: 0 %
Eosinophils Absolute: 0.1 10*3/uL (ref 0.0–0.5)
Eosinophils Relative: 2 %
HCT: 42.3 % (ref 39.0–52.0)
Hemoglobin: 14.4 g/dL (ref 13.0–17.0)
Immature Granulocytes: 0 %
Lymphocytes Relative: 13 %
Lymphs Abs: 0.8 10*3/uL (ref 0.7–4.0)
MCH: 28.8 pg (ref 26.0–34.0)
MCHC: 34 g/dL (ref 30.0–36.0)
MCV: 84.6 fL (ref 80.0–100.0)
Monocytes Absolute: 0.7 10*3/uL (ref 0.1–1.0)
Monocytes Relative: 12 %
Neutro Abs: 4.4 10*3/uL (ref 1.7–7.7)
Neutrophils Relative %: 73 %
Platelets: 246 10*3/uL (ref 150–400)
RBC: 5 MIL/uL (ref 4.22–5.81)
RDW: 13.9 % (ref 11.5–15.5)
WBC: 6 10*3/uL (ref 4.0–10.5)
nRBC: 0 % (ref 0.0–0.2)

## 2019-08-18 MED ORDER — SODIUM CHLORIDE 0.9% FLUSH
10.0000 mL | Freq: Once | INTRAVENOUS | Status: AC
  Administered 2019-08-18: 10 mL via INTRAVENOUS
  Filled 2019-08-18: qty 10

## 2019-08-18 MED ORDER — HEPARIN SOD (PORK) LOCK FLUSH 100 UNIT/ML IV SOLN
500.0000 [IU] | Freq: Once | INTRAVENOUS | Status: AC
  Administered 2019-08-18: 10:00:00 500 [IU] via INTRAVENOUS
  Filled 2019-08-18: qty 5

## 2019-08-18 MED ORDER — POTASSIUM CHLORIDE CRYS ER 20 MEQ PO TBCR: 20.0000 meq | EXTENDED_RELEASE_TABLET | Freq: Every day | ORAL | 2 refills | Status: DC

## 2019-08-18 NOTE — Progress Notes (Signed)
Hematology/Oncology Follow up note Mark Mccarty [phone] Fax:([phone]) [phone]   Patient Care Team: [name], MD as PCP - General (Internal Medicine) [name], RN as Registered Nurse [name], MD as Consulting Physician (Oncology) [name], MD as Referring Physician (Radiation Oncology) [name], MD as Referring Physician (Vascular Surgery)   REASON FOR VISIT Follow up for immunotherapy treatment for stage IIIA lung adenocarcinoma  Oncology History:  Mark Mccarty is a  69 y.o.  male with  adenocarcinoma of lung, diagnosed via biopsy of right hilar lymph nodes.  Former 43 pack year smoking history.  Presents for assessment prior to concurrent chemo T3 N1 M0 Status post concurrent chemoradiation.   03/17/2019 Finished 1 year of durvalumab treatment.  INTERVAL HISTORY Mark Mccarty is a 69 y.o. male who has above history reviewed by me presents for follow-up of stage III A non-small cell lung cancer. Patient reports feeling well. He recently had an episode of sinusitis, cough productive with clear phlegm.  Denies any fever or chills. Appetite is good.  Weight is stable. During the interval, patient has had surveillance CT scan done as well.  Present to discuss results. He also mentioned intermittent headache which is getting worse recently.  He he has headache daily and has been using Tylenol as needed.  . Review of Systems  Constitutional: Negative for appetite change, chills, fatigue, fever and unexpected weight change.  [name]:   Negative for hearing loss and voice change.   Eyes: Negative for eye problems and icterus.  Respiratory: Positive for cough. Negative for chest tightness and shortness of breath.   Cardiovascular: Negative for chest pain and leg swelling.  Gastrointestinal: Negative for abdominal distention and abdominal pain.  Endocrine: Negative for hot flashes.  Genitourinary: Negative for difficulty  urinating, dysuria and frequency.   Musculoskeletal: Negative for arthralgias.  Skin: Negative for itching and rash.  Neurological: Positive for headaches. Negative for light-headedness and numbness.  [name]: Negative for adenopathy. Does not bruise/bleed easily.  Psychiatric/Behavioral: Negative for confusion.     MEDICAL HISTORY:  Past Medical History:  Diagnosis Date  . Asthma   . Cancer ([place])    lung  . Cataract    left cataract surgery  . Dyspnea   . [name]   . GERD (gastroesophageal reflux disease)   . Hypertension   . Neuromuscular disorder ([place])    pt has knees bilateral with tendon issues that cause pain    SURGICAL HISTORY: Past Surgical History:  Procedure Laterality Date  . CARDIAC CATHETERIZATION N/A 12/16/2014   Procedure: Left Heart Cath;  Surgeon: [name], MD;  Location: [facility];  Service: Cardiovascular;  Laterality: N/A;  . CYST REMOVAL TRUNK     chest and back over time and it was removed  . ENDOBRONCHIAL ULTRASOUND N/A 12/09/2017   Procedure: ENDOBRONCHIAL ULTRASOUND;  Surgeon: [name], MD;  Location: [name];  Service: Pulmonary;  Laterality: N/A;  . left cataract surgery    . PORTA CATH INSERTION N/A 12/23/2017   Procedure: PORTA CATH INSERTION;  Surgeon: [name], MD;  Location: [facility];  Service: Cardiovascular;  Laterality: N/A;    SOCIAL HISTORY: Social History   Socioeconomic History  . Marital status: Divorced    Spouse name: Not on file  . Number of children: Not on file  . Years of education: Not on file  . Highest education level: Not on file  Occupational History  . Not on file  Tobacco Use  . Smoking status: Former Smoker    Packs/day: 1.50    Years: 35.00    Pack years: 52.50    Types: Cigarettes    Quit date: 06/22/2015    Years since quitting: 4.1  . Smokeless tobacco: Never Used  Substance and Sexual Activity  . Alcohol use: Yes    Comment: a fifth in a  week  . Drug use: No  . Sexual activity: Not Currently  Other Topics Concern  . Not on file  Social History Narrative  . Not on file   Social Determinants of Health   Financial Resource Strain:   . Difficulty of Paying Living Expenses: Not on file  Food Insecurity:   . Worried About Charity fundraiser in the Last Year: Not on file  . Ran Out of Food in the Last Year: Not on file  Transportation Needs:   . Lack of Transportation (Medical): Not on file  . Lack of Transportation (Non-Medical): Not on file  Physical Activity:   . Days of Exercise per Week: Not on file  . Minutes of Exercise per Session: Not on file  Stress:   . Feeling of Stress : Not on file  Social Connections:   . Frequency of Communication with Friends and Family: Not on file  . Frequency of Social Gatherings with Friends and Family: Not on file  . Attends Religious Services: Not on file  . Active Member of Clubs or Organizations: Not on file  . Attends Archivist Meetings: Not on file  . Marital Status: Not on file  Intimate Partner Violence:   . Fear of Current or Ex-Partner: Not on file  . Emotionally Abused: Not on file  . Physically Abused: Not on file  . Sexually Abused: Not on file    FAMILY HISTORY: Family History  Problem Relation Age of Onset  . Hypertension Mother   . Breast cancer Mother   . Heart attack Mother   . Lung cancer Father   . Heart disease Sister   . Diabetes Sister   . Colon cancer Sister     ALLERGIES:  is allergic to penicillins.  MEDICATIONS:  Current Outpatient Medications  Medication Sig Dispense Refill  . acetaminophen (TYLENOL) 500 MG tablet Take 500 mg by mouth as needed.    [name] amLODipine (NORVASC) 10 MG tablet Take 10 mg by mouth daily.    [name] ascorbic acid (VITAMIN C) 1000 MG tablet Take by mouth.    [name] aspirin 81 MG tablet Take 81 mg by mouth daily.    . carvedilol (COREG) 25 MG tablet Take 25 mg by mouth 2 (two) times daily with a meal.    .  Cholecalciferol 50 MCG (2000 UT) TABS Take by mouth.    . gabapentin (NEURONTIN) 300 MG capsule Take 300 mg by mouth at bedtime as needed.     [name] ibuprofen (ADVIL,MOTRIN) 600 MG tablet Take 600 mg by mouth every 6 (six) hours as needed.    . lidocaine-prilocaine (EMLA) cream Apply to port area once 30 g 3  . lisinopril (PRINIVIL,ZESTRIL) 40 MG tablet Take 40 mg by mouth daily.    . potassium chloride SA (KLOR-CON M20) 20 MEQ tablet Take 2 tablets (40 mEq total) by mouth daily. 60 tablet 2  . pravastatin (PRAVACHOL) 20 MG tablet Take 20 mg by mouth at bedtime.    . prochlorperazine (COMPAZINE) 10 MG tablet Take 1 tablet (10 mg total) by mouth every 6 (six)  hours as needed (Nausea or vomiting). 30 tablet 1  . senna (SENOKOT) 8.6 MG TABS tablet Take 2 tablets (17.2 mg total) by mouth daily. 120 each 0  . tamsulosin (FLOMAX) 0.4 MG CAPS capsule Take 0.4 mg by mouth daily.    [name] zolpidem (AMBIEN) 5 MG tablet Take 1 tablet by mouth.    [name] albuterol (PROVENTIL HFA;VENTOLIN HFA) 108 (90 Base) MCG/ACT inhaler Inhale 2 puffs into the lungs every 6 (six) hours as needed.  12  . ANORO ELLIPTA 62.5-25 MCG/INH AEPB TAKE 1 PUFF BY MOUTH EVERY DAY (Patient not taking: Reported on 05/11/2019) 180 each 3  . fluticasone (FLONASE) 50 MCG/ACT nasal spray PLACE 1 SPRAY INTO BOTH NOSTRILS 2 (TWO) TIMES DAILY  0   No current facility-administered medications for this visit.   Facility-Administered Medications Ordered in Other Visits  Medication Dose Route Frequency Provider Last Rate Last Admin  . sodium chloride flush (NS) 0.9 % injection 10 mL  10 mL Intravenous PRN [name], MD   10 mL at 03/18/18 0858     PHYSICAL EXAMINATION: ECOG PERFORMANCE STATUS: 0 - Asymptomatic Vitals:   08/18/19 0932  BP: (!) 149/85  Pulse: 73  Resp: 16  Temp: (!) 97.4 F (36.3 C)   Filed Weights   08/18/19 0932  Weight: 223 lb 9.6 oz (101.4 kg)    Physical Exam Constitutional:      General: He is not in acute  distress. [name]:     Head: Normocephalic and atraumatic.  Eyes:     General: No scleral icterus.    Conjunctiva/sclera: Conjunctivae normal.     Pupils: Pupils are equal, round, and reactive to light.  Cardiovascular:     Rate and Rhythm: Normal rate and regular rhythm.     Heart sounds: Normal heart sounds.  Pulmonary:     Effort: Pulmonary effort is normal. No respiratory distress.     Breath sounds: No wheezing or rales.     Comments: Decreased breath sound bilaterally Chest:     Chest wall: No tenderness.  Abdominal:     General: Bowel sounds are normal. There is no distension.     Palpations: Abdomen is soft. There is no mass.     Tenderness: There is no abdominal tenderness.  Musculoskeletal:        General: No deformity. Normal range of motion.     Cervical back: Normal range of motion and neck supple.  Lymphadenopathy:     Cervical: No cervical adenopathy.  Skin:    General: Skin is warm and dry.     Findings: No erythema or rash.     Comments: Chest wall Mediport site no erythema or discharge. Left supraclavicular fossa fullness, chronic.  Neurological:     Mental Status: He is alert and oriented to person, place, and time. Mental status is at baseline.     Cranial Nerves: No cranial nerve deficit.     Coordination: Coordination normal.  Psychiatric:        Mood and Affect: Mood normal.        Behavior: Behavior normal.        Thought Content: Thought content normal.      LABORATORY DATA:  I have reviewed the data as listed Lab Results  Component Value Date   WBC 6.0 08/18/2019   HGB 14.4 08/18/2019   HCT 42.3 08/18/2019   MCV 84.6 08/18/2019   PLT 246 08/18/2019   Recent Labs    04/14/19 1121 04/14/19 1121 05/12/19  2025 08/14/19 1103 08/18/19 0917  NA 139  --  138  --  140  K 3.5  --  3.6  --  3.5  CL 103  --  103  --  102  CO2 27  --  29  --  28  GLUCOSE 121*  --  119*  --  123*  BUN 13  --  12  --  12  CREATININE 0.98   < > 0.79 0.84 0.89   CALCIUM 9.3  --  8.8*  --  9.0  GFRNONAA >60   < > >60 >60 >60  GFRAA >60   < > >60 >60 >60  PROT 7.2  --  7.6  --  7.5  ALBUMIN 4.0  --  4.2  --  4.2  AST 24  --  23  --  22  ALT 23  --  23  --  19  ALKPHOS 63  --  65  --  62  BILITOT 0.5  --  0.6  --  0.6   < > = values in this interval not displayed.   RADIOGRAPHIC STUDIES: I have personally reviewed the radiological images as listed and agreed with the findings in the report..  03/21/2018 CT chest showed mild decreasing size of the right upper lobe pulmonary nodule.  Interval decrease in size of right paratracheal and right hilar adenopathy.  06/30/2018 CT chest showed interval development of extensive changes secondary to external beam radiation within the posterior right upper lobe and superior segment of right lower lobe.  The enlarged right paratracheal lymph node is decreased in size in the interval.  Right upper lobe lung lesion is difficult to separate from surrounding changes due to radiation but is favored to have decrease in size in the interval.  Aortic atherosclerosis and emphysema.  LAD coronary artery atherosclerotic calcification.  08/06/2018 CT head without contrast Brain parenchymal appears unremarkable there is no mass or hemorrhage.    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 Continued further decrease in the size of previously identified right.  Tracheal lymph node. Aortic atherosclerosis.  Emphysema.  CT Chest W Contrast  Result Date: 08/14/2019 CLINICAL DATA:  Right lung cancer, status post chemotherapy EXAM: CT CHEST WITH CONTRAST TECHNIQUE: Multidetector CT imaging of the chest was performed during intravenous contrast administration. CONTRAST:  36mL OMNIPAQUE IOHEXOL 300 MG/ML  SOLN COMPARISON:  05/11/2019 FINDINGS: Cardiovascular: The heart is normal in size. No pericardial effusion. No evidence of  thoracic aortic aneurysm. Atherosclerotic calcifications of the aortic arch. Coronary atherosclerosis of the LAD. Left chest port terminates at the cavoatrial junction. Mediastinum/Nodes: Small mediastinal lymph nodes, including a 9 mm short axis low right paratracheal node (series 2/image 48), unchanged. 9 mm short axis subcarinal node (series 2/image 66), unchanged. Visualized thyroid is unremarkable. Lungs/Pleura: Mild biapical pleural-parenchymal scarring. Moderate to severe centrilobular and paraseptal emphysematous changes, upper lung predominant. Stable radiation changes in the posteromedial right upper lobe with stable soft tissue in the medial right suprahilar region (series 2/image 49). Stable scarring in the posterior left upper lobe (series 3/image 26). No new/suspicious pulmonary nodules. No focal consolidation. No pleural effusion or pneumothorax. Upper Abdomen: Visualized upper abdomen is grossly unremarkable. Stable rounded contour along the posterior pancreatic tail (series 2/image 157), unchanged since 2016, benign. Musculoskeletal: Degenerative changes of the mid/lower thoracic spine. Stable sebaceous cyst in the right anterior chest wall. IMPRESSION: Radiation changes in  the right hemithorax. No evidence of recurrent or metastatic disease. Aortic Atherosclerosis (ICD10-I70.0) and Emphysema (ICD10-J43.9). Electronically Signed   By: Julian Hy M.D.   On: 08/14/2019 13:02   MR LUMBAR SPINE WO CONTRAST  Result Date: 06/30/2019 CLINICAL DATA:  Low back pain with leg pain. EXAM: MRI LUMBAR SPINE WITHOUT CONTRAST TECHNIQUE: Multiplanar, multisequence MR imaging of the lumbar spine was performed. No intravenous contrast was administered. COMPARISON:  CT abdomen pelvis 07/10/2014 FINDINGS: Segmentation:  Normal Alignment:  Normal Vertebrae: Large hemangioma L2 vertebral body. Sclerotic lesion left vertebral body and pedicle of L3 unchanged from prior CT. This lesion is very low signal on T1  and T2 and measures approximately 17 x 18 mm. Negative for fracture Conus medullaris and cauda equina: Conus extends to the T12-L1 level. 2 mm nodule in the cauda equina at the L1-2 level seen only on sagittal images. Contrast not administered. Paraspinal and other soft tissues: Negative for paraspinous mass or adenopathy. Disc levels: L1-2: Mild disc and mild facet degeneration.  Negative for stenosis L2-3: Mild disc bulging and mild facet degeneration. Negative for stenosis L3-4: Mild disc bulging and endplate spurring. Mild facet degeneration. Mild subarticular stenosis on the right L4-5: Disc bulging with diffuse endplate spurring. Mild facet degeneration. Moderate subarticular stenosis bilaterally and mild spinal stenosis. L5-S1: Mild facet degeneration.  Negative for stenosis. IMPRESSION: Mild lumbar degenerative changes as described above. Mild spinal stenosis L4-5 with moderate subarticular stenosis bilaterally 2 mm nodule in the cauda equina at the L1-2 level seen only on sagittal images. 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 Sclerotic lesion left L3 vertebral body and pedicle unchanged from 2016. Electronically Signed   By: Franchot Gallo M.D.   On: 06/30/2019 09:36    ASSESSMENT & PLAN:  1. Primary lung adenocarcinoma, right ([place])   2. Hypokalemia   3. Port-A-Cath in place   4. Nonintractable episodic headache, unspecified headache type    Stage IIIA T3 N1 M0 lung adenocarcinoma, s/p concurrent chemoradiation.  Finished 1 year of durvalumab treatments. Labs reviewed and discussed with patient CT scan was independently reviewed by me and discussed with patient. No evidence of recurrence or metastatic disease in the chest. Anticipated radiation changes. Continue CT surveillance every 3 months. Discussed with patient.  Headache, obtain MRI with and without contrast for further evaluation.  Advised patient to use Tylenol every 6 hours.. Chronic hypokalemia, potassium have been stable at 3.5.  Patient  takes oral potassium chloride 40 meq daily.  Recommend patient to decrease to potassium chloride 20 mEq daily and take potassium enriched food.  Refills were sent to pharmacy.  Port-A-Cath in place, continue port flush every 6 to 8 weeks We spent sufficient time to discuss many aspect of care, questions were answered to patient's satisfaction.  Return visit, lab MD 3 months, after CT scan. We spent sufficient time to discuss many aspect of care, questions were answered to patient's satisfaction.   [name], MD, PhD

## 2019-08-18 NOTE — Progress Notes (Signed)
Patient does not offer any problems today.  

## 2019-08-25 ENCOUNTER — Ambulatory Visit
Admission: RE | Admit: 2019-08-25 | Discharge: 2019-08-25 | Disposition: A | Discharge status: Home / Self Care | Payer: Medicare Other | Source: Ambulatory Visit | Attending: Oncology | Admitting: Oncology

## 2019-08-25 ENCOUNTER — Other Ambulatory Visit: Payer: Self-pay

## 2019-08-25 DIAGNOSIS — C3491 Malignant neoplasm of unspecified part of right bronchus or lung: Secondary | ICD-10-CM | POA: Diagnosis present

## 2019-08-25 MED ORDER — GADOBUTROL 1 MMOL/ML IV SOLN
10.0000 mL | Freq: Once | INTRAVENOUS | Status: AC | PRN
  Administered 2019-08-25: 13:00:00 10 mL via INTRAVENOUS

## 2019-09-14 IMAGING — MR MR HEAD WO/W CM
13 series · 48 of 48 positions shown · IV contrast (multihance)
Comparison: PET CT 11/12/2017

CLINICAL DATA: Lung cancer.  Evaluation for metastases.

EXAM:
MRI HEAD WITHOUT AND WITH CONTRAST
TECHNIQUE: Multiplanar, multiecho pulse sequences of the brain and surrounding
structures were obtained without and with intravenous contrast.
CONTRAST:  19mL MULTIHANCE GADOBENATE DIMEGLUMINE 529 MG/ML IV SOLN

[Series 2: T1 · sagittal · 5.0mm · 0.45mm/px · 1 of 29 slices shown (1 of 2)]
[im 1/29]
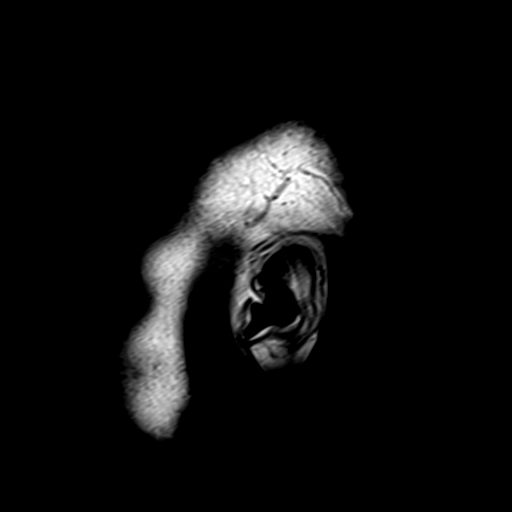

[Series 4: DWI · axial · 3.0mm · 1.80mm/px · z∈[-37,+123]mm · 3 of 49 slices shown (1 of 2)]
[im 1/49]
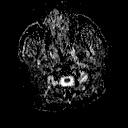
[im 25/49]
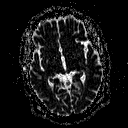
[im 49/49]
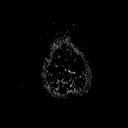

[Series 6: DWI · coronal · 3.0mm · 1.80mm/px · 3 of 49 slices shown (2 of 2)]
[im 1/49]
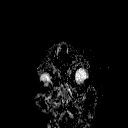
[im 25/49]
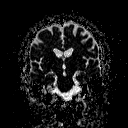
[im 49/49]
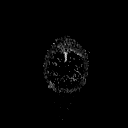

[Series 7: T2 · axial · 5.0mm · 0.60mm/px · z∈[-31,+123]mm · 2 of 25 slices shown (1 of 2)]
[im 1/25]
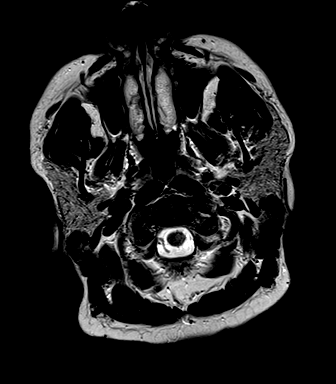
[im 25/25]
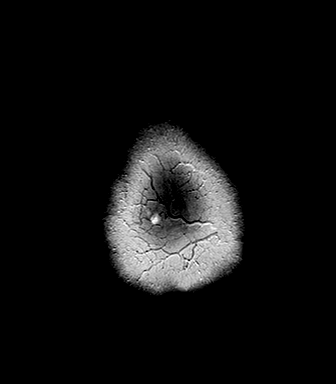

[Series 8: FLAIR · axial · 3.0mm · 0.45mm/px · z∈[-31,+123]mm · 3 of 53 slices shown]
[im 1/53]
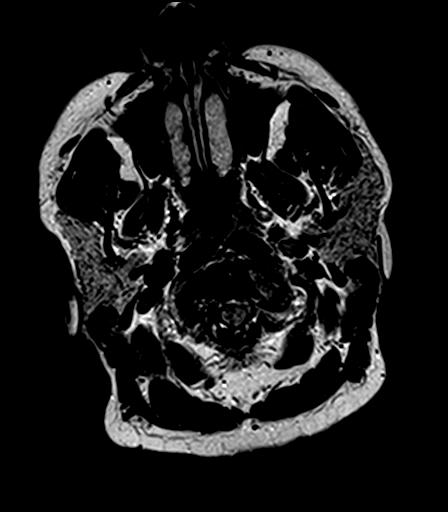
[im 27/53]
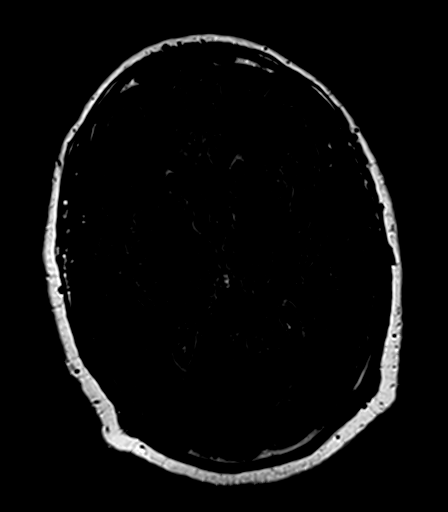
[im 53/53]
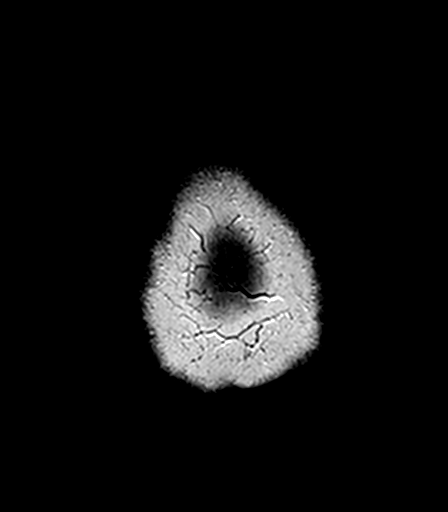

[Series 9: T2 · axial · 5.0mm · 0.45mm/px · z∈[-31,+123]mm · 2 of 25 slices shown (2 of 2)]
[im 1/25]
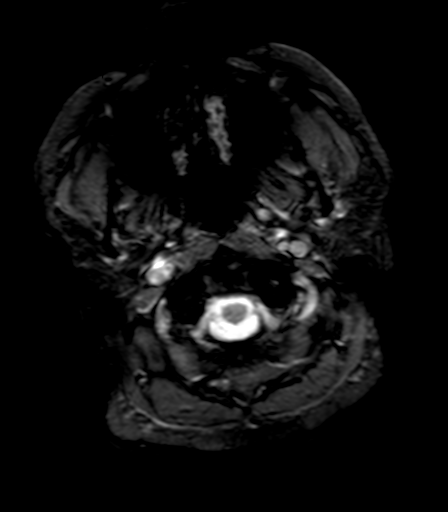
[im 25/25]
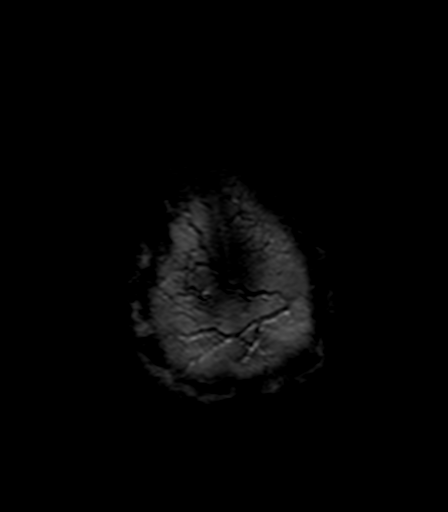

[Series 10: T1 · axial · 1.0mm · 1.00mm/px · z∈[-38,+134]mm · 11 of 176 slices shown (2 of 2)]
[im 1/176]
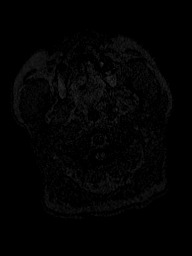
[im 18/176]
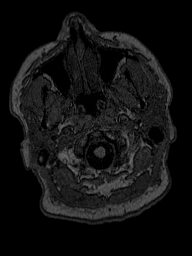
[im 36/176]
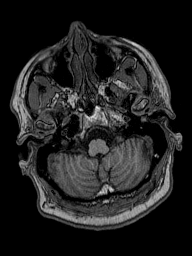
[im 53/176]
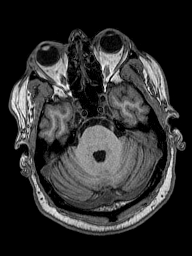
[im 71/176]
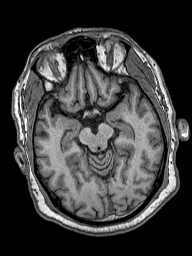
[im 88/176]
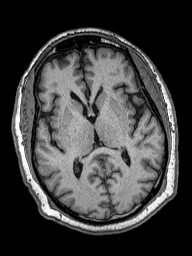
[im 106/176]
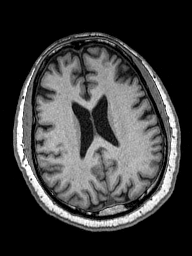
[im 123/176]
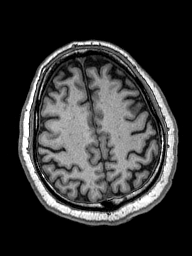
[im 141/176]
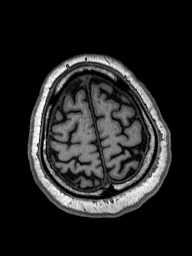
[im 158/176]
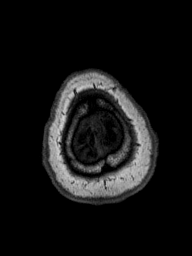
[im 176/176]
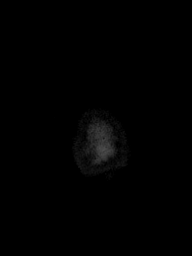

[Series 12: T2 post-contrast · coronal · 5.0mm · 0.49mm/px · 2 of 33 slices shown]
[im 1/33]
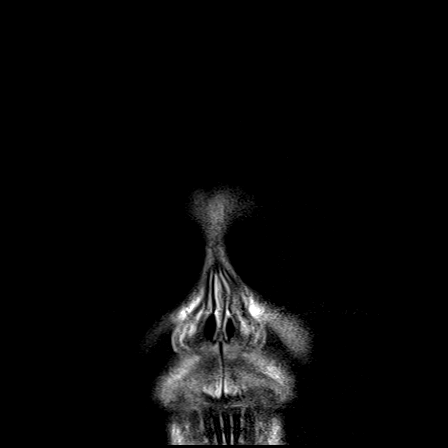
[im 33/33]
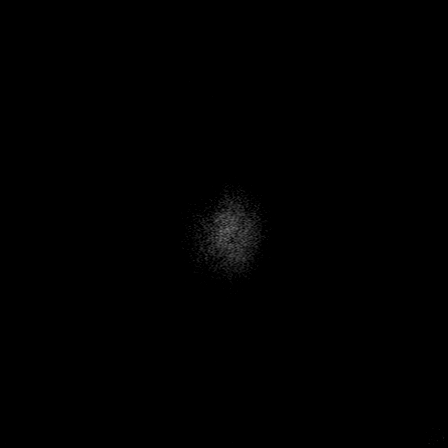

[Series 13: T1 post-contrast · sagittal · 5.0mm · 0.45mm/px · 2 of 29 slices shown (1 of 3)]
[im 1/29]
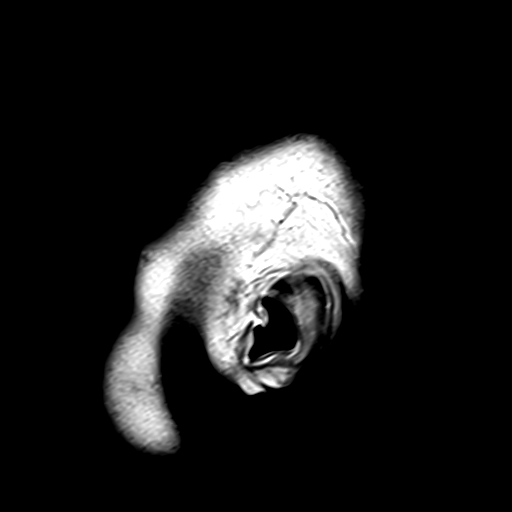
[im 29/29]
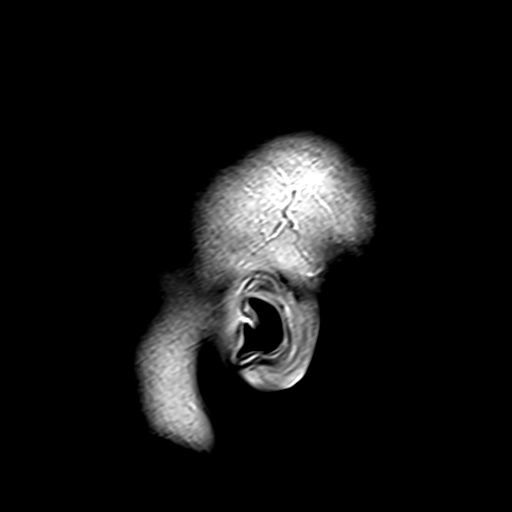

[Series 14: T1 post-contrast · axial · 1.0mm · 1.00mm/px · z∈[-45,+128]mm · 11 of 176 slices shown (2 of 3)]
[im 1/176]
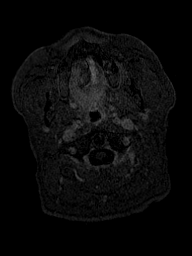
[im 18/176]
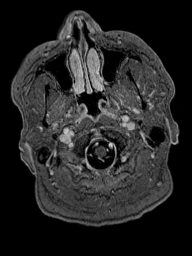
[im 36/176]
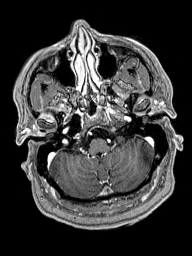
[im 53/176]
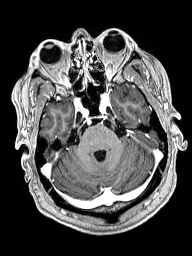
[im 71/176]
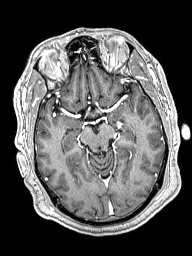
[im 88/176]
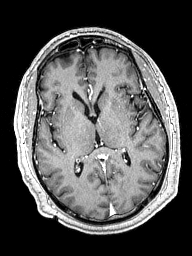
[im 106/176]
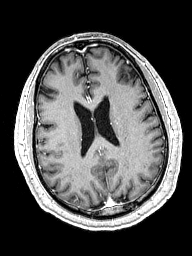
[im 123/176]
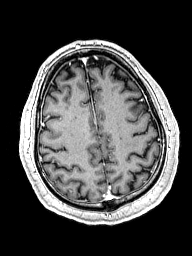
[im 141/176]
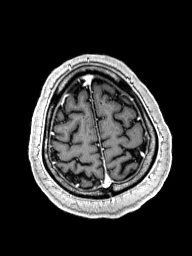
[im 158/176]
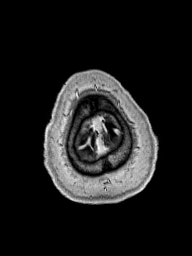
[im 176/176]
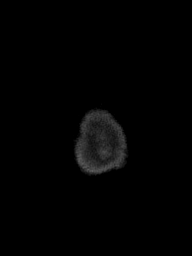

[Series 15: T1 post-contrast · coronal · 5.0mm · 0.43mm/px · 2 of 33 slices shown (3 of 3)]
[im 1/33]
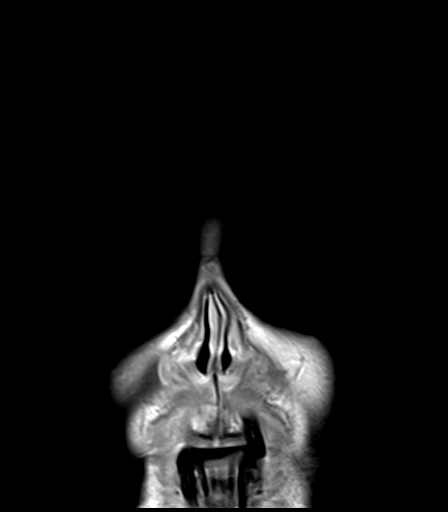
[im 33/33]
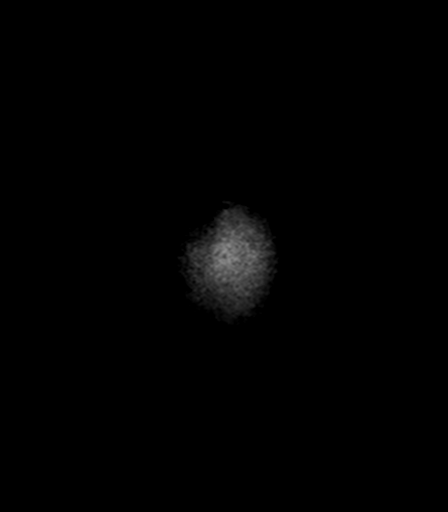

[Series 100: ax (id) · axial · 3.0mm · 1.80mm/px · z∈[-37,+123]mm · 3 of 55 slices shown]
[im 1/55]
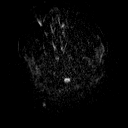
[im 28/55]
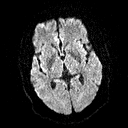
[im 55/55]
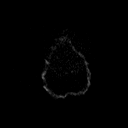

[Series 101: cor (id) · coronal · 3.0mm · 1.80mm/px · 3 of 49 slices shown]
[im 1/49]
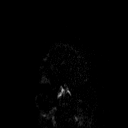
[im 25/49]
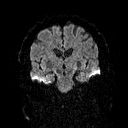
[im 49/49]
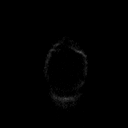

[48 of 48 positions shown; findings below may reference images not displayed]

FINDINGS: BRAIN: There is no acute infarct, acute hemorrhage or mass effect.
The midline structures are normal. There are no old infarcts.
Minimal white matter hyperintensity, nonspecific and commonly seen
in asymptomatic patients of this age. The CSF spaces are normal for
age, with no hydrocephalus. Susceptibility-sensitive sequences show
no chronic microhemorrhage or superficial siderosis.

VASCULAR: Major intracranial arterial and venous sinus flow voids
are preserved.

SKULL AND UPPER CERVICAL SPINE: The visualized skull base,
calvarium, upper cervical spine and extracranial soft tissues are
normal.

SINUSES/ORBITS: No fluid levels or advanced mucosal thickening. No
mastoid or middle ear effusion. The orbits are normal.
IMPRESSION: No intracranial or calvarial metastatic disease. Normal aging brain.

## 2019-09-29 ENCOUNTER — Inpatient Hospital Stay: Payer: Medicare Other | Attending: Oncology

## 2019-09-29 ENCOUNTER — Other Ambulatory Visit: Payer: Self-pay

## 2019-09-29 DIAGNOSIS — Z923 Personal history of irradiation: Secondary | ICD-10-CM | POA: Insufficient documentation

## 2019-09-29 DIAGNOSIS — E876 Hypokalemia: Secondary | ICD-10-CM | POA: Insufficient documentation

## 2019-09-29 DIAGNOSIS — Z79899 Other long term (current) drug therapy: Secondary | ICD-10-CM | POA: Diagnosis not present

## 2019-09-29 DIAGNOSIS — Z452 Encounter for adjustment and management of vascular access device: Secondary | ICD-10-CM | POA: Diagnosis not present

## 2019-09-29 DIAGNOSIS — I10 Essential (primary) hypertension: Secondary | ICD-10-CM | POA: Insufficient documentation

## 2019-09-29 DIAGNOSIS — Z95828 Presence of other vascular implants and grafts: Secondary | ICD-10-CM

## 2019-09-29 DIAGNOSIS — Z87891 Personal history of nicotine dependence: Secondary | ICD-10-CM | POA: Diagnosis not present

## 2019-09-29 DIAGNOSIS — Z9221 Personal history of antineoplastic chemotherapy: Secondary | ICD-10-CM | POA: Insufficient documentation

## 2019-09-29 DIAGNOSIS — C3491 Malignant neoplasm of unspecified part of right bronchus or lung: Secondary | ICD-10-CM | POA: Diagnosis present

## 2019-09-29 LAB — CBC WITH DIFFERENTIAL/PLATELET
Abs Immature Granulocytes: 0.03 10*3/uL (ref 0.00–0.07)
Basophils Absolute: 0 10*3/uL (ref 0.0–0.1)
Basophils Relative: 1 %
Eosinophils Absolute: 0.1 10*3/uL (ref 0.0–0.5)
Eosinophils Relative: 2 %
HCT: 39.8 % (ref 39.0–52.0)
Hemoglobin: 13.9 g/dL (ref 13.0–17.0)
Immature Granulocytes: 1 %
Lymphocytes Relative: 14 %
Lymphs Abs: 0.9 10*3/uL (ref 0.7–4.0)
MCH: 29.5 pg (ref 26.0–34.0)
MCHC: 34.9 g/dL (ref 30.0–36.0)
MCV: 84.5 fL (ref 80.0–100.0)
Monocytes Absolute: 0.7 10*3/uL (ref 0.1–1.0)
Monocytes Relative: 11 %
Neutro Abs: 4.6 10*3/uL (ref 1.7–7.7)
Neutrophils Relative %: 71 %
Platelets: 219 10*3/uL (ref 150–400)
RBC: 4.71 MIL/uL (ref 4.22–5.81)
RDW: 14.4 % (ref 11.5–15.5)
WBC: 6.4 10*3/uL (ref 4.0–10.5)
nRBC: 0 % (ref 0.0–0.2)

## 2019-09-29 LAB — COMPREHENSIVE METABOLIC PANEL
ALT: 21 U/L (ref 0–44)
AST: 23 U/L (ref 15–41)
Albumin: 4 g/dL (ref 3.5–5.0)
Alkaline Phosphatase: 61 U/L (ref 38–126)
Anion gap: 8 (ref 5–15)
BUN: 13 mg/dL (ref 8–23)
CO2: 28 mmol/L (ref 22–32)
Calcium: 8.9 mg/dL (ref 8.9–10.3)
Chloride: 101 mmol/L (ref 98–111)
Creatinine, Ser: 0.79 mg/dL (ref 0.61–1.24)
GFR calc Af Amer: >60 mL/min (ref >60)
GFR calc non Af Amer: >60 mL/min (ref >60)
Glucose, Bld: 113 mg/dL — ABNORMAL HIGH (ref 70–99)
Potassium: 3.2 mmol/L — ABNORMAL LOW (ref 3.5–5.1)
Sodium: 137 mmol/L (ref 135–145)
Total Bilirubin: 0.4 mg/dL (ref 0.3–1.2)
Total Protein: 7.2 g/dL (ref 6.5–8.1)

## 2019-09-29 LAB — TSH: TSH: 1.444 u[IU]/mL (ref 0.350–4.500)

## 2019-09-29 MED ORDER — HEPARIN SOD (PORK) LOCK FLUSH 100 UNIT/ML IV SOLN
500.0000 [IU] | Freq: Once | INTRAVENOUS | Status: AC
  Administered 2019-09-29: 500 [IU] via INTRAVENOUS
  Filled 2019-09-29: qty 5

## 2019-09-29 MED ORDER — HEPARIN SOD (PORK) LOCK FLUSH 100 UNIT/ML IV SOLN
INTRAVENOUS | Status: AC
  Filled 2019-09-29: qty 5

## 2019-09-29 MED ORDER — SODIUM CHLORIDE 0.9% FLUSH
10.0000 mL | Freq: Once | INTRAVENOUS | Status: AC
  Administered 2019-09-29: 10 mL via INTRAVENOUS
  Filled 2019-09-29: qty 10

## 2019-10-05 ENCOUNTER — Other Ambulatory Visit: Payer: Self-pay

## 2019-10-05 ENCOUNTER — Other Ambulatory Visit
Admission: RE | Admit: 2019-10-05 | Discharge: 2019-10-05 | Disposition: A | Discharge status: Home / Self Care | Payer: Medicare Other | Source: Ambulatory Visit | Attending: Internal Medicine | Admitting: Internal Medicine

## 2019-10-05 DIAGNOSIS — Z01812 Encounter for preprocedural laboratory examination: Secondary | ICD-10-CM | POA: Insufficient documentation

## 2019-10-05 DIAGNOSIS — Z20822 Contact with and (suspected) exposure to covid-19: Secondary | ICD-10-CM | POA: Insufficient documentation

## 2019-10-06 ENCOUNTER — Encounter: Payer: Self-pay | Admitting: Internal Medicine

## 2019-10-06 LAB — SARS CORONAVIRUS 2 (TAT 6-24 HRS): SARS Coronavirus 2: NEGATIVE

## 2019-10-07 ENCOUNTER — Encounter: Payer: Self-pay | Admitting: Internal Medicine

## 2019-10-07 ENCOUNTER — Ambulatory Visit
Admission: RE | Admit: 2019-10-07 | Discharge: 2019-10-07 | Disposition: A | Discharge status: Home / Self Care | Payer: Medicare Other | Attending: Internal Medicine | Admitting: Internal Medicine

## 2019-10-07 ENCOUNTER — Encounter
Admission: RE | Disposition: A | Discharge status: Home / Self Care | Payer: Self-pay | Source: Home / Self Care | Attending: Internal Medicine

## 2019-10-07 DIAGNOSIS — Z5309 Procedure and treatment not carried out because of other contraindication: Secondary | ICD-10-CM | POA: Diagnosis not present

## 2019-10-07 HISTORY — DX: Hyperlipidemia, unspecified: E78.5

## 2019-10-07 HISTORY — DX: Centrilobular emphysema: J43.2

## 2019-10-07 HISTORY — DX: Male erectile dysfunction, unspecified: N52.9

## 2019-10-07 HISTORY — DX: Vitamin D deficiency, unspecified: E55.9

## 2019-10-07 HISTORY — DX: Other specified bacterial intestinal infections: A04.8

## 2019-10-07 HISTORY — DX: Peptic ulcer, site unspecified, unspecified as acute or chronic, without hemorrhage or perforation: K27.9

## 2019-10-07 HISTORY — DX: Heart disease, unspecified: I51.9

## 2019-10-07 SURGERY — COLONOSCOPY WITH PROPOFOL
Anesthesia: General

## 2019-10-07 MED ORDER — SODIUM CHLORIDE 0.9 % IV SOLN: INTRAVENOUS | Status: DC

## 2019-10-07 NOTE — Progress Notes (Signed)
Patient cancelled due to patient ate the day before.

## 2019-11-09 ENCOUNTER — Ambulatory Visit
Admission: RE | Admit: 2019-11-09 | Discharge: 2019-11-09 | Disposition: A | Discharge status: Home / Self Care | Payer: Medicare Other | Source: Ambulatory Visit | Attending: Oncology | Admitting: Oncology

## 2019-11-09 ENCOUNTER — Other Ambulatory Visit: Payer: Self-pay

## 2019-11-09 DIAGNOSIS — C3491 Malignant neoplasm of unspecified part of right bronchus or lung: Secondary | ICD-10-CM | POA: Diagnosis present

## 2019-11-11 ENCOUNTER — Encounter: Payer: Self-pay | Admitting: Oncology

## 2019-11-11 ENCOUNTER — Other Ambulatory Visit: Payer: Self-pay

## 2019-11-11 ENCOUNTER — Inpatient Hospital Stay (HOSPITAL_BASED_OUTPATIENT_CLINIC_OR_DEPARTMENT_OTHER): Payer: Medicare Other | Admitting: Oncology

## 2019-11-11 ENCOUNTER — Inpatient Hospital Stay: Payer: Medicare Other | Attending: Oncology

## 2019-11-11 ENCOUNTER — Telehealth: Payer: Self-pay

## 2019-11-11 VITALS — BP 136/78 | HR 74 | Temp 96.5°F | Resp 18 | Wt 225.7 lb

## 2019-11-11 DIAGNOSIS — Z95828 Presence of other vascular implants and grafts: Secondary | ICD-10-CM

## 2019-11-11 DIAGNOSIS — Z9221 Personal history of antineoplastic chemotherapy: Secondary | ICD-10-CM | POA: Insufficient documentation

## 2019-11-11 DIAGNOSIS — Z801 Family history of malignant neoplasm of trachea, bronchus and lung: Secondary | ICD-10-CM | POA: Insufficient documentation

## 2019-11-11 DIAGNOSIS — Z8249 Family history of ischemic heart disease and other diseases of the circulatory system: Secondary | ICD-10-CM | POA: Diagnosis not present

## 2019-11-11 DIAGNOSIS — J45909 Unspecified asthma, uncomplicated: Secondary | ICD-10-CM | POA: Diagnosis not present

## 2019-11-11 DIAGNOSIS — Z803 Family history of malignant neoplasm of breast: Secondary | ICD-10-CM | POA: Diagnosis not present

## 2019-11-11 DIAGNOSIS — I10 Essential (primary) hypertension: Secondary | ICD-10-CM | POA: Diagnosis not present

## 2019-11-11 DIAGNOSIS — C3411 Malignant neoplasm of upper lobe, right bronchus or lung: Secondary | ICD-10-CM | POA: Diagnosis not present

## 2019-11-11 DIAGNOSIS — C3491 Malignant neoplasm of unspecified part of right bronchus or lung: Secondary | ICD-10-CM

## 2019-11-11 DIAGNOSIS — Z833 Family history of diabetes mellitus: Secondary | ICD-10-CM | POA: Diagnosis not present

## 2019-11-11 DIAGNOSIS — Z923 Personal history of irradiation: Secondary | ICD-10-CM | POA: Insufficient documentation

## 2019-11-11 DIAGNOSIS — Z791 Long term (current) use of non-steroidal anti-inflammatories (NSAID): Secondary | ICD-10-CM | POA: Insufficient documentation

## 2019-11-11 DIAGNOSIS — Z79899 Other long term (current) drug therapy: Secondary | ICD-10-CM | POA: Insufficient documentation

## 2019-11-11 DIAGNOSIS — K219 Gastro-esophageal reflux disease without esophagitis: Secondary | ICD-10-CM | POA: Diagnosis not present

## 2019-11-11 DIAGNOSIS — E876 Hypokalemia: Secondary | ICD-10-CM | POA: Insufficient documentation

## 2019-11-11 DIAGNOSIS — J439 Emphysema, unspecified: Secondary | ICD-10-CM | POA: Insufficient documentation

## 2019-11-11 DIAGNOSIS — Z87891 Personal history of nicotine dependence: Secondary | ICD-10-CM | POA: Diagnosis not present

## 2019-11-11 DIAGNOSIS — E785 Hyperlipidemia, unspecified: Secondary | ICD-10-CM | POA: Insufficient documentation

## 2019-11-11 DIAGNOSIS — Z7982 Long term (current) use of aspirin: Secondary | ICD-10-CM | POA: Insufficient documentation

## 2019-11-11 LAB — CBC WITH DIFFERENTIAL/PLATELET
Abs Immature Granulocytes: 0.04 10*3/uL (ref 0.00–0.07)
Basophils Absolute: 0 10*3/uL (ref 0.0–0.1)
Basophils Relative: 0 %
Eosinophils Absolute: 0.1 10*3/uL (ref 0.0–0.5)
Eosinophils Relative: 2 %
HCT: 39.5 % (ref 39.0–52.0)
Hemoglobin: 13.9 g/dL (ref 13.0–17.0)
Immature Granulocytes: 1 %
Lymphocytes Relative: 11 %
Lymphs Abs: 0.8 10*3/uL (ref 0.7–4.0)
MCH: 29.8 pg (ref 26.0–34.0)
MCHC: 35.2 g/dL (ref 30.0–36.0)
MCV: 84.8 fL (ref 80.0–100.0)
Monocytes Absolute: 0.7 10*3/uL (ref 0.1–1.0)
Monocytes Relative: 11 %
Neutro Abs: 5.1 10*3/uL (ref 1.7–7.7)
Neutrophils Relative %: 75 %
Platelets: 266 10*3/uL (ref 150–400)
RBC: 4.66 MIL/uL (ref 4.22–5.81)
RDW: 13.8 % (ref 11.5–15.5)
WBC: 6.8 10*3/uL (ref 4.0–10.5)
nRBC: 0 % (ref 0.0–0.2)

## 2019-11-11 LAB — COMPREHENSIVE METABOLIC PANEL
ALT: 22 U/L (ref 0–44)
AST: 23 U/L (ref 15–41)
Albumin: 3.9 g/dL (ref 3.5–5.0)
Alkaline Phosphatase: 59 U/L (ref 38–126)
Anion gap: 8 (ref 5–15)
BUN: 15 mg/dL (ref 8–23)
CO2: 29 mmol/L (ref 22–32)
Calcium: 9 mg/dL (ref 8.9–10.3)
Chloride: 103 mmol/L (ref 98–111)
Creatinine, Ser: 1.02 mg/dL (ref 0.61–1.24)
GFR calc Af Amer: >60 mL/min (ref >60)
GFR calc non Af Amer: >60 mL/min (ref >60)
Glucose, Bld: 118 mg/dL — ABNORMAL HIGH (ref 70–99)
Potassium: 3.5 mmol/L (ref 3.5–5.1)
Sodium: 140 mmol/L (ref 135–145)
Total Bilirubin: 0.7 mg/dL (ref 0.3–1.2)
Total Protein: 7.1 g/dL (ref 6.5–8.1)

## 2019-11-11 MED ORDER — POTASSIUM CHLORIDE ER 10 MEQ PO TBCR: 10.0000 meq | EXTENDED_RELEASE_TABLET | Freq: Every day | ORAL | 0 refills | Status: DC

## 2019-11-11 MED ORDER — SODIUM CHLORIDE 0.9% FLUSH
10.0000 mL | INTRAVENOUS | Status: DC | PRN
  Administered 2019-11-11: 10 mL via INTRAVENOUS
  Filled 2019-11-11: qty 10

## 2019-11-11 MED ORDER — HEPARIN SOD (PORK) LOCK FLUSH 100 UNIT/ML IV SOLN
500.0000 [IU] | Freq: Once | INTRAVENOUS | Status: AC
  Administered 2019-11-11: 10:00:00 500 [IU] via INTRAVENOUS
  Filled 2019-11-11: qty 5

## 2019-11-11 NOTE — Progress Notes (Signed)
Patient here for follow up and CT results. No new concerns voiced.

## 2019-11-11 NOTE — Progress Notes (Signed)
Hematology/Oncology Follow up note [name] [phone] [phone]   Patient Care Team: Mark Kayser, MD as PCP - General (Internal Medicine) Mark Nab, RN as Registered Nurse [name], MD as Consulting Physician (Oncology) Mark Filbert, MD as Referring Physician (Radiation Oncology) Mark Huxley, MD as Referring Physician (Vascular Surgery)   REASON FOR VISIT Follow up for immunotherapy treatment for stage IIIA lung adenocarcinoma  Oncology History:  Mark Mccarty is a  69 y.o.  male with  adenocarcinoma of lung, diagnosed via biopsy of right hilar lymph nodes.  Former 43 pack year smoking history.  Presents for assessment prior to concurrent chemo T3 N1 M0 Status post concurrent chemoradiation.   03/17/2019 Finished 1 year of durvalumab treatment.  INTERVAL HISTORY Mark Mccarty is a 69 y.o. male who has above history reviewed by me presents for follow-up of stage III A non-small cell lung cancer. Patient has no new complaints.  She had surveillance CT scan done and presents for follow-up.  . Review of Systems  Constitutional: Negative for appetite change, chills, fatigue, fever and unexpected weight change.  [name]:   Negative for hearing loss and voice change.   Eyes: Negative for eye problems and icterus.  Respiratory: Negative for chest tightness, cough and shortness of breath.   Cardiovascular: Negative for chest pain and leg swelling.  Gastrointestinal: Negative for abdominal distention and abdominal pain.  Endocrine: Negative for hot flashes.  Genitourinary: Negative for difficulty urinating, dysuria and frequency.   Musculoskeletal: Negative for arthralgias.  Skin: Negative for itching and rash.  Neurological: Negative for headaches, light-headedness and numbness.  [name]: Negative for adenopathy. Does not bruise/bleed easily.  Psychiatric/Behavioral: Negative for confusion.     MEDICAL HISTORY:   Past Medical History:  Diagnosis Date  . Asthma   . Cancer ([place])    lung  . Cataract    left cataract surgery  . Centrilobular emphysema ([place])   . Dyspnea   . [name]   . ED (erectile dysfunction)   . GERD (gastroesophageal reflux disease)   . [name]   . Hyperlipidemia   . Hypertension   . Left ventricular diastolic dysfunction   . Neuromuscular disorder ([place])    pt has knees bilateral with tendon issues that cause pain  . [name] (peptic ulcer disease)   . [name]     SURGICAL HISTORY: Past Surgical History:  Procedure Laterality Date  . CARDIAC CATHETERIZATION N/A 12/16/2014   Procedure: Left Heart Cath;  Surgeon: [name], MD;  Location: [name];  Service: Cardiovascular;  Laterality: N/A;  . COLONOSCOPY    . CYST REMOVAL TRUNK     chest and back over time and it was removed  . CYSTECTOMY    . ENDOBRONCHIAL ULTRASOUND N/A 12/09/2017   Procedure: ENDOBRONCHIAL ULTRASOUND;  Surgeon: [name], MD;  Location: [name];  Service: Pulmonary;  Laterality: N/A;  . left cataract surgery    . PORTA CATH INSERTION N/A 12/23/2017   Procedure: PORTA CATH INSERTION;  Surgeon: Mark Huxley, MD;  Location: [name];  Service: Cardiovascular;  Laterality: N/A;  . UPPER GI ENDOSCOPY      SOCIAL HISTORY: Social History   Socioeconomic History  . Marital status: Divorced    Spouse name: Not on file  . Number of children: Not on file  . Years of education: Not on file  . Highest education level: Not on file  Occupational History  . Not  on file  Tobacco Use  . Smoking status: Former Smoker    Packs/day: 1.50    Years: 35.00    Pack years: 52.50    Types: Cigarettes    Quit date: 06/22/2015    Years since quitting: 4.3  . Smokeless tobacco: Never Used  Substance and Sexual Activity  . Alcohol use: Yes    Comment: weekends  . Drug use: No  . Sexual activity: Not Currently  Other Topics  Concern  . Not on file  Social History Narrative  . Not on file   Social Determinants of Health   Financial Resource Strain:   . Difficulty of Paying Living Expenses:   Food Insecurity:   . Worried About Charity fundraiser in the Last Year:   . Arboriculturist in the Last Year:   Transportation Needs:   . Film/video editor (Medical):   [name] Lack of Transportation (Non-Medical):   Physical Activity:   . Days of Exercise per Week:   . Minutes of Exercise per Session:   Stress:   . Feeling of Stress :   Social Connections:   . Frequency of Communication with Friends and Family:   . Frequency of Social Gatherings with Friends and Family:   . Attends Religious Services:   . Active Member of Clubs or Organizations:   . Attends Archivist Meetings:   [name] Marital Status:   Intimate Partner Violence:   . Fear of Current or Ex-Partner:   . Emotionally Abused:   [name] Physically Abused:   . Sexually Abused:     FAMILY HISTORY: Family History  Problem Relation Age of Onset  . Hypertension Mother   . Breast cancer Mother   . Heart attack Mother   . Lung cancer Father   . Heart disease Sister   . Diabetes Sister   . Colon cancer Sister     ALLERGIES:  is allergic to penicillins.  MEDICATIONS:  Current Outpatient Medications  Medication Sig Dispense Refill  . acetaminophen (TYLENOL) 500 MG tablet Take 500 mg by mouth as needed.    [name] albuterol (PROVENTIL HFA;VENTOLIN HFA) 108 (90 Base) MCG/ACT inhaler Inhale 2 puffs into the lungs every 6 (six) hours as needed.  12  . amLODipine (NORVASC) 10 MG tablet Take 10 mg by mouth daily.    [name] ascorbic acid (VITAMIN C) 1000 MG tablet Take by mouth.    [name] aspirin 81 MG tablet Take 81 mg by mouth daily.    . carvedilol (COREG) 25 MG tablet Take 25 mg by mouth 2 (two) times daily with a meal.    . Cholecalciferol 50 MCG (2000 UT) TABS Take by mouth.    . gabapentin (NEURONTIN) 300 MG capsule Take 300 mg by mouth at bedtime as  needed.     [name] ibuprofen (ADVIL,MOTRIN) 600 MG tablet Take 600 mg by mouth every 6 (six) hours as needed.    . lidocaine-prilocaine (EMLA) cream Apply to port area once 30 g 3  . lisinopril (PRINIVIL,ZESTRIL) 40 MG tablet Take 40 mg by mouth daily.    . potassium chloride SA (KLOR-CON M20) 20 MEQ tablet Take 1 tablet (20 mEq total) by mouth daily. 60 tablet 2  . pravastatin (PRAVACHOL) 20 MG tablet Take 20 mg by mouth at bedtime.    . tamsulosin (FLOMAX) 0.4 MG CAPS capsule Take 0.4 mg by mouth daily.    . prochlorperazine (COMPAZINE) 10 MG tablet Take 1 tablet (10 mg total) by  mouth every 6 (six) hours as needed (Nausea or vomiting). (Patient not taking: Reported on 11/11/2019) 30 tablet 1  . senna (SENOKOT) 8.6 MG TABS tablet Take 2 tablets (17.2 mg total) by mouth daily. (Patient not taking: Reported on 11/11/2019) 120 each 0  . zolpidem (AMBIEN) 5 MG tablet Take 1 tablet by mouth.     No current facility-administered medications for this visit.   Facility-Administered Medications Ordered in Other Visits  Medication Dose Route Frequency Provider Last Rate Last Admin  . sodium chloride flush (NS) 0.9 % injection 10 mL  10 mL Intravenous PRN [name], MD   10 mL at 03/18/18 0858  . sodium chloride flush (NS) 0.9 % injection 10 mL  10 mL Intravenous PRN Charlaine Dalton R, MD   10 mL at 11/11/19 0930     PHYSICAL EXAMINATION: ECOG PERFORMANCE STATUS: 0 - Asymptomatic Vitals:   11/11/19 0939  BP: 136/78  Pulse: 74  Resp: 18  Temp: (!) 96.5 F (35.8 C)  SpO2: 98%   Filed Weights   11/11/19 0939  Weight: 225 lb 11.2 oz (102.4 kg)    Physical Exam Constitutional:      General: He is not in acute distress. [name]:     Head: Normocephalic and atraumatic.  Eyes:     General: No scleral icterus.    Conjunctiva/sclera: Conjunctivae normal.     Pupils: Pupils are equal, round, and reactive to light.  Cardiovascular:     Rate and Rhythm: Normal rate and regular rhythm.     Heart  sounds: Normal heart sounds.  Pulmonary:     Effort: Pulmonary effort is normal. No respiratory distress.     Breath sounds: No wheezing or rales.     Comments: Decreased breath sound bilaterally Chest:     Chest wall: No tenderness.  Abdominal:     General: Bowel sounds are normal. There is no distension.     Palpations: Abdomen is soft. There is no mass.     Tenderness: There is no abdominal tenderness.  Musculoskeletal:        General: No deformity. Normal range of motion.     Cervical back: Normal range of motion and neck supple.  Lymphadenopathy:     Cervical: No cervical adenopathy.  Skin:    General: Skin is warm and dry.     Findings: No erythema or rash.     Comments: Chest wall Mediport site no erythema or discharge. Left supraclavicular fossa fullness, chronic.  Neurological:     Mental Status: He is alert and oriented to person, place, and time. Mental status is at baseline.     Cranial Nerves: No cranial nerve deficit.     Coordination: Coordination normal.  Psychiatric:        Mood and Affect: Mood normal.      LABORATORY DATA:  I have reviewed the data as listed Lab Results  Component Value Date   WBC 6.8 11/11/2019   HGB 13.9 11/11/2019   HCT 39.5 11/11/2019   MCV 84.8 11/11/2019   PLT 266 11/11/2019   Recent Labs    08/18/19 0917 09/29/19 1302 11/11/19 0926  NA 140 137 140  K 3.5 3.2* 3.5  CL 102 101 103  CO2 28 28 29   GLUCOSE 123* 113* 118*  BUN 12 13 15   CREATININE 0.89 0.79 1.02  CALCIUM 9.0 8.9 9.0  GFRNONAA >60 >60 >60  GFRAA >60 >60 >60  PROT 7.5 7.2 7.1  ALBUMIN 4.2 4.0 3.9  AST 22 23 23   ALT 19 21 22   ALKPHOS 62 61 59  BILITOT 0.6 0.4 0.7   RADIOGRAPHIC STUDIES: I have personally reviewed the radiological images as listed and agreed with the findings in the report..  03/21/2018 CT chest showed mild decreasing size of the right upper lobe pulmonary nodule.  Interval decrease in size of right paratracheal and right hilar  adenopathy.  06/30/2018 CT chest showed interval development of extensive changes secondary to external beam radiation within the posterior right upper lobe and superior segment of right lower lobe.  The enlarged right paratracheal lymph node is decreased in size in the interval.  Right upper lobe lung lesion is difficult to separate from surrounding changes due to radiation but is favored to have decrease in size in the interval.  Aortic atherosclerosis and emphysema.  LAD coronary artery atherosclerotic calcification.  08/06/2018 CT head without contrast Brain parenchymal appears unremarkable there is no mass or hemorrhage.    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 Continued further decrease in the size of previously identified right.  Tracheal lymph node. Aortic atherosclerosis.  Emphysema.  CT Chest Wo Contrast  Result Date: 11/09/2019 CLINICAL DATA:  Stage IIIA right upper lobe lung adenocarcinoma status post concurrent chemoradiation therapy and immunotherapy. Restaging. EXAM: CT CHEST WITHOUT CONTRAST TECHNIQUE: Multidetector CT imaging of the chest was performed following the standard protocol without IV contrast. COMPARISON:  08/14/2019 chest CT. FINDINGS: Cardiovascular: Normal heart size. No significant pericardial effusion/thickening. Left anterior descending and right coronary atherosclerosis. Atherosclerotic nonaneurysmal thoracic aorta. Normal caliber pulmonary arteries. Left internal jugular Port-A-Cath terminates at the cavoatrial junction. Mediastinum/Nodes: No discrete thyroid nodules. Unremarkable esophagus. No axillary adenopathy. No pathologically enlarged mediastinal or discrete hilar nodes on these noncontrast images. Lungs/Pleura: No pneumothorax. No pleural effusion. Severe centrilobular and paraseptal emphysema with bullous emphysema in the medial left upper lobe. Right  perihilar consolidation with associated bronchiectasis, volume loss and distortion, unchanged, compatible with radiation fibrosis. Irregular 7 mm apical left upper lobe nodule (series 3/image 24) is stable. A few scattered tiny 2 mm nodules in the right middle lobe are all stable. No acute consolidative airspace disease or new significant pulmonary nodules. Upper abdomen: No acute abnormality. Musculoskeletal: No aggressive appearing focal osseous lesions. Superficial subcutaneous circumscribed 2.1 cm soft tissue density lesion in ventral right upper chest (series 2/image 3), stable, compatible with a benign lesion such as a sebaceous cyst. Thoracic spondylosis. IMPRESSION: 1. Stable right perihilar radiation fibrosis with no findings of local tumor recurrence. 2. No evidence of recurrent metastatic disease in the chest. Small bilateral pulmonary nodules are all stable. 3. Two-vessel coronary atherosclerosis. 4. Aortic Atherosclerosis (ICD10-I70.0) and Emphysema (ICD10-J43.9). Electronically Signed   By: Ilona Sorrel M.D.   On: 11/09/2019 10:33   CT Chest W Contrast  Result Date: 08/14/2019 CLINICAL DATA:  Right lung cancer, status post chemotherapy EXAM: CT CHEST WITH CONTRAST TECHNIQUE: Multidetector CT imaging of the chest was performed during intravenous contrast administration. CONTRAST:  32mL OMNIPAQUE IOHEXOL 300 MG/ML  SOLN COMPARISON:  05/11/2019 FINDINGS: Cardiovascular: The heart is normal in size. No pericardial effusion. No evidence of thoracic aortic aneurysm. Atherosclerotic calcifications of the aortic arch. Coronary atherosclerosis of the LAD. Left chest port terminates at the cavoatrial junction. Mediastinum/Nodes: Small mediastinal lymph nodes, including a 9 mm short axis low right paratracheal node (series 2/image 48), unchanged. 9 mm short axis subcarinal node (series 2/image 66),  unchanged. Visualized thyroid is unremarkable. Lungs/Pleura: Mild biapical pleural-parenchymal scarring.  Moderate to severe centrilobular and paraseptal emphysematous changes, upper lung predominant. Stable radiation changes in the posteromedial right upper lobe with stable soft tissue in the medial right suprahilar region (series 2/image 49). Stable scarring in the posterior left upper lobe (series 3/image 26). No new/suspicious pulmonary nodules. No focal consolidation. No pleural effusion or pneumothorax. Upper Abdomen: Visualized upper abdomen is grossly unremarkable. Stable rounded contour along the posterior pancreatic tail (series 2/image 157), unchanged since 2016, benign. Musculoskeletal: Degenerative changes of the mid/lower thoracic spine. Stable sebaceous cyst in the right anterior chest wall. IMPRESSION: Radiation changes in the right hemithorax. No evidence of recurrent or metastatic disease. Aortic Atherosclerosis (ICD10-I70.0) and Emphysema (ICD10-J43.9). Electronically Signed   By: Julian Hy M.D.   On: 08/14/2019 13:02   MR Brain W Wo Contrast  Result Date: 08/25/2019 CLINICAL DATA:  Lung cancer. EXAM: MRI HEAD WITHOUT AND WITH CONTRAST TECHNIQUE: Multiplanar, multiecho pulse sequences of the brain and surrounding structures were obtained without and with intravenous contrast. CONTRAST:  30mL GADAVIST GADOBUTROL 1 MMOL/ML IV SOLN COMPARISON:  MRI of the brain December 10, 2017 FINDINGS: Brain: No acute infarction, hemorrhage, hydrocephalus, extra-axial collection or mass lesion. A few scattered foci of T2 hyperintensity are seen within the white matter of cerebral hemispheres, nonspecific, most likely related to chronic small vessel ischemia. Small remote lacunar infarcts in the bilateral cerebellar hemispheres. No focal parenchymal or meningeal abnormal contrast enhancement seen. Vascular: Normal flow voids. Skull and upper cervical spine: Normal marrow signal. Sinuses/Orbits: Mild mucosal thickening of the ethmoid cells in frontal sinuses. Left lens surgery. Other: None. IMPRESSION: 1. No  evidence of intracranial metastatic disease. 2. Mild chronic chronic white matter disease. Electronically Signed   By: Pedro Earls M.D.   On: 08/25/2019 16:12    ASSESSMENT & PLAN:  1. Primary lung adenocarcinoma, right ([place])   2. Hypokalemia   3. Port-A-Cath in place    Stage IIIA T3 N1 M0 lung adenocarcinoma, s/p concurrent chemoradiation.  Finished 1 year of durvalumab treatments. Labs reviewed and discussed with patient.   Surveillance CT was independently reviewed by me and discussed with patient. No evidence of recurrence or metastatic disease in the chest.  Stable right perihilar radiation fibrosis. Small nodules are all stable.  #Chronic hypokalemia, potassium is stable at 3.5 today.  Decreased to potassium chloride 10 mEq daily.  Port-A-Cath in place, continue port flush every 6 to 8 weeks We spent sufficient time to discuss many aspect of care, questions were answered to patient's satisfaction.  Return visit, lab MD 4 months, after CT scan. We spent sufficient time to discuss many aspect of care, questions were answered to patient's satisfaction.   [name], MD, PhD

## 2019-11-11 NOTE — Telephone Encounter (Signed)
-----   Message from [name], MD sent at 11/11/2019  3:43 PM EDT ----- Potasium is stable. I decreased his KCL to 46meq daily. He can finish his current supply and move to 40meq daily. Thanks.

## 2019-11-11 NOTE — Telephone Encounter (Signed)
Patient notified

## 2020-01-06 ENCOUNTER — Other Ambulatory Visit: Payer: Self-pay

## 2020-01-06 ENCOUNTER — Inpatient Hospital Stay: Payer: Medicare Other | Attending: Oncology

## 2020-01-06 DIAGNOSIS — Z452 Encounter for adjustment and management of vascular access device: Secondary | ICD-10-CM | POA: Diagnosis present

## 2020-01-06 DIAGNOSIS — Z95828 Presence of other vascular implants and grafts: Secondary | ICD-10-CM

## 2020-01-06 DIAGNOSIS — C3411 Malignant neoplasm of upper lobe, right bronchus or lung: Secondary | ICD-10-CM | POA: Diagnosis present

## 2020-01-06 MED ORDER — HEPARIN SOD (PORK) LOCK FLUSH 100 UNIT/ML IV SOLN
500.0000 [IU] | Freq: Once | INTRAVENOUS | Status: AC
  Administered 2020-01-06: 500 [IU] via INTRAVENOUS
  Filled 2020-01-06: qty 5

## 2020-01-06 MED ORDER — SODIUM CHLORIDE 0.9% FLUSH
10.0000 mL | INTRAVENOUS | Status: DC | PRN
  Administered 2020-01-06: 10 mL via INTRAVENOUS
  Filled 2020-01-06: qty 10

## 2020-01-12 ENCOUNTER — Other Ambulatory Visit
Admission: RE | Admit: 2020-01-12 | Discharge: 2020-01-12 | Disposition: A | Discharge status: Home / Self Care | Payer: Medicare Other | Source: Ambulatory Visit | Attending: Internal Medicine | Admitting: Internal Medicine

## 2020-01-12 ENCOUNTER — Other Ambulatory Visit: Payer: Self-pay

## 2020-01-12 DIAGNOSIS — Z20822 Contact with and (suspected) exposure to covid-19: Secondary | ICD-10-CM | POA: Diagnosis not present

## 2020-01-12 DIAGNOSIS — Z01812 Encounter for preprocedural laboratory examination: Secondary | ICD-10-CM | POA: Insufficient documentation

## 2020-01-12 LAB — SARS CORONAVIRUS 2 (TAT 6-24 HRS): SARS Coronavirus 2: NEGATIVE

## 2020-01-13 ENCOUNTER — Encounter: Payer: Self-pay | Admitting: Internal Medicine

## 2020-01-14 ENCOUNTER — Encounter: Payer: Self-pay | Admitting: Internal Medicine

## 2020-01-14 ENCOUNTER — Encounter
Admission: RE | Disposition: A | Discharge status: Home / Self Care | Payer: Self-pay | Source: Home / Self Care | Attending: Internal Medicine

## 2020-01-14 ENCOUNTER — Other Ambulatory Visit: Payer: Self-pay

## 2020-01-14 ENCOUNTER — Ambulatory Visit: Payer: Medicare Other | Admitting: Anesthesiology

## 2020-01-14 ENCOUNTER — Ambulatory Visit
Admission: RE | Admit: 2020-01-14 | Discharge: 2020-01-14 | Disposition: A | Discharge status: Home / Self Care | Payer: Medicare Other | Attending: Internal Medicine | Admitting: Internal Medicine

## 2020-01-14 DIAGNOSIS — J45909 Unspecified asthma, uncomplicated: Secondary | ICD-10-CM | POA: Insufficient documentation

## 2020-01-14 DIAGNOSIS — Z86010 Personal history of colonic polyps: Secondary | ICD-10-CM | POA: Diagnosis not present

## 2020-01-14 DIAGNOSIS — E785 Hyperlipidemia, unspecified: Secondary | ICD-10-CM | POA: Diagnosis not present

## 2020-01-14 DIAGNOSIS — E559 Vitamin D deficiency, unspecified: Secondary | ICD-10-CM | POA: Insufficient documentation

## 2020-01-14 DIAGNOSIS — Z8711 Personal history of peptic ulcer disease: Secondary | ICD-10-CM | POA: Diagnosis not present

## 2020-01-14 DIAGNOSIS — I351 Nonrheumatic aortic (valve) insufficiency: Secondary | ICD-10-CM | POA: Insufficient documentation

## 2020-01-14 DIAGNOSIS — I10 Essential (primary) hypertension: Secondary | ICD-10-CM | POA: Insufficient documentation

## 2020-01-14 DIAGNOSIS — Z880 Allergy status to penicillin: Secondary | ICD-10-CM | POA: Insufficient documentation

## 2020-01-14 DIAGNOSIS — Z791 Long term (current) use of non-steroidal anti-inflammatories (NSAID): Secondary | ICD-10-CM | POA: Diagnosis not present

## 2020-01-14 DIAGNOSIS — N400 Enlarged prostate without lower urinary tract symptoms: Secondary | ICD-10-CM | POA: Insufficient documentation

## 2020-01-14 DIAGNOSIS — K5730 Diverticulosis of large intestine without perforation or abscess without bleeding: Secondary | ICD-10-CM | POA: Diagnosis not present

## 2020-01-14 DIAGNOSIS — R1310 Dysphagia, unspecified: Secondary | ICD-10-CM | POA: Insufficient documentation

## 2020-01-14 DIAGNOSIS — Z1211 Encounter for screening for malignant neoplasm of colon: Secondary | ICD-10-CM | POA: Insufficient documentation

## 2020-01-14 DIAGNOSIS — Z9842 Cataract extraction status, left eye: Secondary | ICD-10-CM | POA: Insufficient documentation

## 2020-01-14 DIAGNOSIS — R0602 Shortness of breath: Secondary | ICD-10-CM | POA: Insufficient documentation

## 2020-01-14 DIAGNOSIS — J432 Centrilobular emphysema: Secondary | ICD-10-CM | POA: Diagnosis not present

## 2020-01-14 DIAGNOSIS — Z7982 Long term (current) use of aspirin: Secondary | ICD-10-CM | POA: Insufficient documentation

## 2020-01-14 DIAGNOSIS — K219 Gastro-esophageal reflux disease without esophagitis: Secondary | ICD-10-CM | POA: Insufficient documentation

## 2020-01-14 DIAGNOSIS — K640 First degree hemorrhoids: Secondary | ICD-10-CM | POA: Diagnosis not present

## 2020-01-14 DIAGNOSIS — G709 Myoneural disorder, unspecified: Secondary | ICD-10-CM | POA: Diagnosis not present

## 2020-01-14 DIAGNOSIS — Z79899 Other long term (current) drug therapy: Secondary | ICD-10-CM | POA: Diagnosis not present

## 2020-01-14 DIAGNOSIS — E119 Type 2 diabetes mellitus without complications: Secondary | ICD-10-CM | POA: Insufficient documentation

## 2020-01-14 HISTORY — PX: COLONOSCOPY WITH PROPOFOL: SHX5780

## 2020-01-14 HISTORY — DX: Hyperuricemia without signs of inflammatory arthritis and tophaceous disease: E79.0

## 2020-01-14 HISTORY — DX: Dysphagia, unspecified: R13.10

## 2020-01-14 HISTORY — DX: Benign prostatic hyperplasia without lower urinary tract symptoms: N40.0

## 2020-01-14 HISTORY — DX: Diverticulosis of intestine, part unspecified, without perforation or abscess without bleeding: K57.90

## 2020-01-14 HISTORY — DX: Gastritis, unspecified, without bleeding: K29.70

## 2020-01-14 HISTORY — DX: Benign neoplasm of colon, unspecified: D12.6

## 2020-01-14 HISTORY — DX: Duodenitis without bleeding: K29.80

## 2020-01-14 HISTORY — DX: Nonrheumatic aortic (valve) insufficiency: I35.1

## 2020-01-14 SURGERY — COLONOSCOPY WITH PROPOFOL
Anesthesia: General

## 2020-01-14 MED ORDER — SODIUM CHLORIDE 0.9 % IV SOLN
INTRAVENOUS | Status: DC
  Administered 2020-01-14: 1000 mL via INTRAVENOUS

## 2020-01-14 MED ORDER — PROPOFOL 500 MG/50ML IV EMUL
INTRAVENOUS | Status: DC | PRN
  Administered 2020-01-14: 150 ug/kg/min via INTRAVENOUS

## 2020-01-14 MED ORDER — PROPOFOL 500 MG/50ML IV EMUL
INTRAVENOUS | Status: AC
  Filled 2020-01-14: qty 50

## 2020-01-14 NOTE — Transfer of Care (Signed)
Immediate Anesthesia Transfer of Care Note  Patient: Mark Mccarty  Procedure(s) Performed: COLONOSCOPY WITH PROPOFOL (N/A )  Patient Location: PACU  Anesthesia Type:General  Level of Consciousness: awake and sedated  Airway & Oxygen Therapy: Patient Spontanous Breathing and Patient connected to nasal cannula oxygen  Post-op Assessment: Report given to RN and Post -op Vital signs reviewed and stable  Post vital signs: Reviewed and stable  Last Vitals:  Vitals Value Taken Time  BP    Temp    Pulse    Resp    SpO2      Last Pain:  Vitals:   01/14/20 0756  TempSrc: Temporal  PainSc: 0-No pain         Complications: No complications documented.

## 2020-01-14 NOTE — Op Note (Signed)
[facility] Gastroenterology Patient Name: Mark Mccarty Procedure Date: 01/14/2020 8:22 AM MRN: [id number] Account #: [id number] Date of Birth: 08/07/1950 Admit Type: Outpatient Age: 69 Room: [facility] ENDO ROOM 3 Gender: Male Note Status: Finalized Procedure:             Colonoscopy Indications:           Surveillance: Personal history of adenomatous polyps                         on last colonoscopy > 5 years ago Providers:             Lorie Apley K. Alice Reichert MD, MD Referring MD:          Christena Flake. Raechel Ache, MD (Referring MD) Medicines:             Propofol per Anesthesia Complications:         No immediate complications. Procedure:             Pre-Anesthesia Assessment:                        - The risks and benefits of the procedure and the                         sedation options and risks were discussed with the                         patient. All questions were answered and informed                         consent was obtained.                        - Patient identification and proposed procedure were                         verified prior to the procedure by the nurse. The                         procedure was verified in the procedure room.                        - ASA Grade Assessment: II - A patient with mild                         systemic disease.                        - After reviewing the risks and benefits, the patient                         was deemed in satisfactory condition to undergo the                         procedure.                        After obtaining informed consent, the colonoscope was                         passed under direct  vision. Throughout the procedure,                         the patient's blood pressure, pulse, and oxygen                         saturations were monitored continuously. The                         Colonoscope was introduced through the anus and                         advanced to the the cecum, identified by  appendiceal                         orifice and ileocecal valve. The colonoscopy was                         performed without difficulty. The patient tolerated                         the procedure well. The quality of the bowel                         preparation was adequate. The ileocecal valve,                         appendiceal orifice, and rectum were photographed. Findings:      The perianal and digital rectal examinations were normal. Pertinent       negatives include normal sphincter tone and no palpable rectal lesions.      Many small and large-mouthed diverticula were found in the sigmoid colon.      Non-bleeding internal hemorrhoids were found during retroflexion. The       hemorrhoids were Grade I (internal hemorrhoids that do not prolapse).      The exam was otherwise without abnormality. Impression:            - Diverticulosis in the sigmoid colon.                        - Non-bleeding internal hemorrhoids.                        - The examination was otherwise normal.                        - No specimens collected. Recommendation:        - Patient has a contact number available for                         emergencies. The signs and symptoms of potential                         delayed complications were discussed with the patient.                         Return to normal activities tomorrow. Written  discharge instructions were provided to the patient.                        - Resume previous diet.                        - Continue present medications.                        - Repeat colonoscopy in 5 years for surveillance.                        - Return to GI office PRN.                        - The findings and recommendations were discussed with                         the patient. Procedure Code(s):     --- Professional ---                        U3833, Colorectal cancer screening; colonoscopy on                         individual at high  risk Diagnosis Code(s):     --- Professional ---                        K57.30, Diverticulosis of large intestine without                         perforation or abscess without bleeding                        K64.0, First degree hemorrhoids                        Z86.010, Personal history of colonic polyps CPT copyright 2019 American Medical Association. All rights reserved. The codes documented in this report are preliminary and upon coder review may  be revised to meet current compliance requirements. Efrain Sella MD, MD 01/14/2020 8:43:25 AM This report has been signed electronically. Number of Addenda: 0 Note Initiated On: 01/14/2020 8:22 AM Scope Withdrawal Time: 0 hours 6 minutes 28 seconds  Total Procedure Duration: 0 hours 9 minutes 32 seconds  Estimated Blood Loss:  Estimated blood loss: none.      [facility]

## 2020-01-14 NOTE — Anesthesia Postprocedure Evaluation (Signed)
Anesthesia Post Note  Patient: Mark Mccarty  Procedure(s) Performed: COLONOSCOPY WITH PROPOFOL (N/A )  Patient location during evaluation: Endoscopy Anesthesia Type: General Level of consciousness: awake and alert Pain management: pain level controlled Vital Signs Assessment: post-procedure vital signs reviewed and stable Respiratory status: spontaneous breathing, nonlabored ventilation, respiratory function stable and patient connected to nasal cannula oxygen Cardiovascular status: blood pressure returned to baseline and stable Postop Assessment: no apparent nausea or vomiting Anesthetic complications: no   No complications documented.   Last Vitals:  Vitals:   01/14/20 0850 01/14/20 0900  BP: 122/73   Pulse: 68 70  Resp: 13   Temp:    SpO2: 95% 94%    Last Pain:  Vitals:   01/14/20 0850  TempSrc:   PainSc: 0-No pain                 Martha Clan

## 2020-01-14 NOTE — H&P (Signed)
Outpatient short stay form Pre-procedure 01/14/2020 8:18 AM Mark Mccarty, M.D.  Primary Physician: [name], M.D.  Reason for visit:  Personal history of adenomatous colon polyp (2015)  History of present illness:                            Patient presents for colonoscopy for a personal hx of colon polyps. The patient denies abdominal pain, abnormal weight loss or rectal bleeding.      Current Facility-Administered Medications:  .  0.9 %  sodium chloride infusion, , Intravenous, Continuous, [place], Benay Pike, MD, Last Rate: 20 mL/hr at 01/14/20 0813, 1,000 mL at 01/14/20 0813  Facility-Administered Medications Ordered in Other Encounters:  .  sodium chloride flush (NS) 0.9 % injection 10 mL, 10 mL, Intravenous, PRN, [name], MD, 10 mL at 03/18/18 0858  Medications Prior to Admission  Medication Sig Dispense Refill Last Dose  . acetaminophen (TYLENOL) 500 MG tablet Take 500 mg by mouth as needed.   Past Week at Unknown time  . amLODipine (NORVASC) 10 MG tablet Take 10 mg by mouth daily.   01/14/2020 at 0500  . ascorbic acid (VITAMIN C) 1000 MG tablet Take by mouth.   Past Week at Unknown time  . aspirin 81 MG tablet Take 81 mg by mouth daily.   01/13/2020 at Unknown time  . carvedilol (COREG) 25 MG tablet Take 25 mg by mouth 2 (two) times daily with a meal.   01/14/2020 at 0500  . Cholecalciferol 50 MCG (2000 UT) TABS Take by mouth.   Past Week at Unknown time  . fluticasone (FLONASE) 50 MCG/ACT nasal spray Place into both nostrils daily.   01/13/2020 at Unknown time  . ibuprofen (ADVIL,MOTRIN) 600 MG tablet Take 600 mg by mouth every 6 (six) hours as needed.   Past Week at Unknown time  . lisinopril (PRINIVIL,ZESTRIL) 40 MG tablet Take 40 mg by mouth daily.   01/14/2020 at 0500  . potassium chloride (KLOR-CON) 10 MEQ tablet Take 1 tablet (10 mEq total) by mouth daily. 90 tablet 0 01/13/2020 at Unknown time  . prochlorperazine (COMPAZINE) 10 MG tablet Take 1 tablet (10 mg total) by  mouth every 6 (six) hours as needed (Nausea or vomiting). 30 tablet 1 01/13/2020 at Unknown time  . albuterol (PROVENTIL HFA;VENTOLIN HFA) 108 (90 Base) MCG/ACT inhaler Inhale 2 puffs into the lungs every 6 (six) hours as needed.  12  at prn  . gabapentin (NEURONTIN) 300 MG capsule Take 300 mg by mouth at bedtime as needed.      . lidocaine-prilocaine (EMLA) cream Apply to port area once 30 g 3   . pravastatin (PRAVACHOL) 20 MG tablet Take 20 mg by mouth at bedtime.     . senna (SENOKOT) 8.6 MG TABS tablet Take 2 tablets (17.2 mg total) by mouth daily. (Patient not taking: Reported on 11/11/2019) 120 each 0   . tamsulosin (FLOMAX) 0.4 MG CAPS capsule Take 0.4 mg by mouth daily.     [name] zolpidem (AMBIEN) 5 MG tablet Take 1 tablet by mouth. (Patient not taking: Reported on 01/14/2020)   Not Taking at Unknown time     Allergies  Allergen Reactions  . Penicillins Rash    Stomach hurt Has patient had a PCN reaction causing immediate rash, facial/tongue/throat swelling, SOB or lightheadedness with hypotension: yes Has patient had a PCN reaction causing severe rash involving mucus membranes or skin necrosis: no Has patient had  a PCN reaction that required hospitalization: no Has patient had a PCN reaction occurring within the last 10 years: yes If all of the above answers are "NO", then may proceed with Cephalosporin use.      Past Medical History:  Diagnosis Date  . Aortic insufficiency   . Asthma   . BPH (benign prostatic hyperplasia)   . Cancer ([place])    lung  . Cataract    left cataract surgery  . Centrilobular emphysema ([place])   . Centrilobular emphysema ([place])   . Colon adenomas   . Diverticulosis   . Duodenitis   . Dysphagia   . Dyspnea   . [name]   . ED (erectile dysfunction)   . Gastritis   . GERD (gastroesophageal reflux disease)   . [name]   . Hyperlipidemia   . Hypertension   . Hyperuricemia   . Left ventricular diastolic dysfunction   .  Neuromuscular disorder ([place])    pt has knees bilateral with tendon issues that cause pain  . [name] (peptic ulcer disease)   . [name]     Review of systems:  Otherwise negative.    Physical Exam  Gen: Alert, oriented. Appears stated age.  HEENT: [place]/AT. PERRLA. Lungs: CTA, no wheezes. CV: RR nl S1, S2. Abd: soft, benign, no masses. BS+ Ext: No edema. Pulses 2+    Planned procedures: Proceed with colonoscopy. The patient understands the nature of the planned procedure, indications, risks, alternatives and potential complications including but not limited to bleeding, infection, perforation, damage to internal organs and possible oversedation/side effects from anesthesia. The patient agrees and gives consent to proceed.  Please refer to procedure notes for findings, recommendations and patient disposition/instructions.     Wil Slape K. Alice Mccarty, M.D. Gastroenterology 01/14/2020  8:18 AM

## 2020-01-14 NOTE — Anesthesia Preprocedure Evaluation (Signed)
Anesthesia Evaluation  Patient identified by MRN, date of birth, ID band Patient awake    Reviewed: Allergy & Precautions, NPO status , Patient's Chart, lab work & pertinent test results, reviewed documented beta blocker date and time   History of Anesthesia Complications Negative for: history of anesthetic complications  Airway Mallampati: III  TM Distance: >3 FB     Dental  (+) Caps, Dental Advidsory Given, Partial Upper, Missing, Chipped   Pulmonary shortness of breath and with exertion, asthma , COPD, neg recent URI, former smoker,           Cardiovascular Exercise Tolerance: Good hypertension, Pt. on medications and Pt. on home beta blockers (-) angina(-) CAD, (-) Past MI and (-) Cardiac Stents + dysrhythmias (-) Valvular Problems/Murmurs     Neuro/Psych neg Seizures  Neuromuscular disease    GI/Hepatic Neg liver ROS, [name], GERD  Controlled,  Endo/Other  diabetes  Renal/GU negative Renal ROS     Musculoskeletal   Abdominal   Peds  Hematology   Anesthesia Other Findings Past Medical History: No date: Aortic insufficiency No date: Asthma No date: BPH (benign prostatic hyperplasia) No date: Cancer ([place])     Comment:  lung No date: Cataract     Comment:  left cataract surgery No date: Centrilobular emphysema ([facility]) No date: Centrilobular emphysema ([facility]) No date: Colon adenomas No date: Diverticulosis No date: Duodenitis No date: Dysphagia No date: Dyspnea No date: [name] No date: ED (erectile dysfunction) No date: Gastritis No date: GERD (gastroesophageal reflux disease) No date: [name] No date: Hyperlipidemia No date: Hypertension No date: Hyperuricemia No date: Left ventricular diastolic dysfunction No date: Neuromuscular disorder ([place])     Comment:  pt has knees bilateral with tendon issues that cause               pain No date: [name] (peptic ulcer disease) No date:  [name]   Reproductive/Obstetrics                             Anesthesia Physical  Anesthesia Plan  ASA: III  Anesthesia Plan: General   Post-op Pain Management:    Induction: Intravenous  PONV Risk Score and Plan: 2 and Propofol infusion and TIVA  Airway Management Planned: Natural Airway and Nasal Cannula  Additional Equipment:   Intra-op Plan:   Post-operative Plan:   Informed Consent: I have reviewed the patients History and Physical, chart, labs and discussed the procedure including the risks, benefits and alternatives for the proposed anesthesia with the patient or authorized representative who has indicated his/her understanding and acceptance.       Plan Discussed with: CRNA  Anesthesia Plan Comments:         Anesthesia Quick Evaluation

## 2020-01-14 NOTE — Anesthesia Procedure Notes (Signed)
Performed by: Cook-Martin, Aryanne Gilleland Pre-anesthesia Checklist: Patient identified, Emergency Drugs available, Suction available, Patient being monitored and Timeout performed Patient Re-evaluated:Patient Re-evaluated prior to induction Oxygen Delivery Method: Nasal cannula Preoxygenation: Pre-oxygenation with 100% oxygen Placement Confirmation: positive ETCO2 and CO2 detector       

## 2020-01-14 NOTE — Interval H&P Note (Signed)
History and Physical Interval Note:  01/14/2020 8:19 AM  Mark Mccarty  has presented today for surgery, with the diagnosis of PRS HX COLON POLYPS.  The various methods of treatment have been discussed with the patient and family. After consideration of risks, benefits and other options for treatment, the patient has consented to  Procedure(s): COLONOSCOPY WITH PROPOFOL (N/A) as a surgical intervention.  The patient's history has been reviewed, patient examined, no change in status, stable for surgery.  I have reviewed the patient's chart and labs.  Questions were answered to the patient's satisfaction.     [place], [place]

## 2020-01-15 ENCOUNTER — Encounter: Payer: Self-pay | Admitting: Internal Medicine

## 2020-01-20 ENCOUNTER — Encounter: Payer: Self-pay | Admitting: Urology

## 2020-01-26 ENCOUNTER — Other Ambulatory Visit: Payer: Self-pay

## 2020-01-26 ENCOUNTER — Ambulatory Visit
Admission: RE | Admit: 2020-01-26 | Discharge: 2020-01-26 | Disposition: A | Discharge status: Home / Self Care | Payer: Medicare Other | Source: Ambulatory Visit | Attending: Radiation Oncology | Admitting: Radiation Oncology

## 2020-01-26 ENCOUNTER — Encounter: Payer: Self-pay | Admitting: Radiation Oncology

## 2020-01-26 VITALS — BP 136/77 | HR 74 | Temp 97.2°F | Wt 222.0 lb

## 2020-01-26 DIAGNOSIS — R918 Other nonspecific abnormal finding of lung field: Secondary | ICD-10-CM | POA: Diagnosis not present

## 2020-01-26 DIAGNOSIS — C3491 Malignant neoplasm of unspecified part of right bronchus or lung: Secondary | ICD-10-CM

## 2020-01-26 DIAGNOSIS — Z85118 Personal history of other malignant neoplasm of bronchus and lung: Secondary | ICD-10-CM | POA: Diagnosis present

## 2020-01-26 DIAGNOSIS — Z923 Personal history of irradiation: Secondary | ICD-10-CM | POA: Insufficient documentation

## 2020-01-26 NOTE — Progress Notes (Signed)
Radiation Oncology Follow up Note  Name: Mark Mccarty   Date:   01/26/2020 MRN:  [id number] DOB: 09/26/1950    This 69 y.o. male presents to the clinic today for 2-year follow-up status post concurrent chemoradiation therapy stage III adenocarcinoma the right lung.  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  He specifically denies cough hemoptysis chest tightness or weight loss..  He had a recent CT scan back in May which I have reviewed showing stable right perihilar radiation fibrosis no findings for local tumor recurrence or evident any evidence of recurrent metastatic disease in the chest.  He has some small bilateral pulmonary nodules which are stable.  Patient has completed a year of immunotherapy.  COMPLICATIONS OF TREATMENT: none  FOLLOW UP COMPLIANCE: keeps appointments   PHYSICAL EXAM:  BP (!) 136/77 (BP Location: Left Arm, Patient Position: Sitting, Cuff Size: Normal)    Pulse 74    Temp (!) 97.2 F (36.2 C) (Tympanic)    Wt (!) 222 lb (100.7 kg)    BMI 30.11 kg/m  Well-developed well-nourished patient in NAD. HEENT reveals PERLA, EOMI, discs not visualized.  Oral cavity is clear. No oral mucosal lesions are identified. Neck is clear without evidence of cervical or supraclavicular adenopathy. Lungs are clear to A&P. Cardiac examination is essentially unremarkable with regular rate and rhythm without murmur rub or thrill. Abdomen is benign with no organomegaly or masses noted. Motor sensory and DTR levels are equal and symmetric in the upper and lower extremities. Cranial nerves II through XII are grossly intact. Proprioception is intact. No peripheral adenopathy or edema is identified. No motor or sensory levels are noted. Crude visual fields are within normal range.  RADIOLOGY  RESULTS: CT scans reviewed compatible with above-stated findings  PLAN: Present time he continues to do well with no evidence of disease 2 years out.  And pleased with his overall progress.  Of asked to see him back in 1 year for follow-up.  He is already scheduled for follow-up CT scan in September which have asked to review when it becomes available.  Patient knows to call with any concerns.  I would like to take this opportunity to thank you for allowing me to participate in the care of your patient.[name], MD

## 2020-02-03 ENCOUNTER — Other Ambulatory Visit: Payer: Self-pay | Admitting: Oncology

## 2020-02-03 NOTE — Telephone Encounter (Signed)
   Ref Range & Units 2 mo ago  (11/11/19) 4 mo ago  (09/29/19) 5 mo ago  (08/18/19) 5 mo ago  (08/14/19) 8 mo ago  (05/12/19) 9 mo ago  (04/14/19) 10 mo ago  (03/17/19)  Potassium 3.5 - 5.1 mmol/L 3.5  3.2Low  3.5   3.6  3.5  3.5

## 2020-03-02 ENCOUNTER — Other Ambulatory Visit: Payer: Self-pay

## 2020-03-02 ENCOUNTER — Inpatient Hospital Stay: Payer: Medicare Other | Attending: Oncology

## 2020-03-02 DIAGNOSIS — K7689 Other specified diseases of liver: Secondary | ICD-10-CM | POA: Diagnosis not present

## 2020-03-02 DIAGNOSIS — Z803 Family history of malignant neoplasm of breast: Secondary | ICD-10-CM | POA: Diagnosis not present

## 2020-03-02 DIAGNOSIS — Z7982 Long term (current) use of aspirin: Secondary | ICD-10-CM | POA: Insufficient documentation

## 2020-03-02 DIAGNOSIS — Z87891 Personal history of nicotine dependence: Secondary | ICD-10-CM | POA: Insufficient documentation

## 2020-03-02 DIAGNOSIS — K219 Gastro-esophageal reflux disease without esophagitis: Secondary | ICD-10-CM | POA: Insufficient documentation

## 2020-03-02 DIAGNOSIS — J439 Emphysema, unspecified: Secondary | ICD-10-CM | POA: Diagnosis not present

## 2020-03-02 DIAGNOSIS — E785 Hyperlipidemia, unspecified: Secondary | ICD-10-CM | POA: Insufficient documentation

## 2020-03-02 DIAGNOSIS — I2510 Atherosclerotic heart disease of native coronary artery without angina pectoris: Secondary | ICD-10-CM | POA: Insufficient documentation

## 2020-03-02 DIAGNOSIS — Z8249 Family history of ischemic heart disease and other diseases of the circulatory system: Secondary | ICD-10-CM | POA: Insufficient documentation

## 2020-03-02 DIAGNOSIS — Z801 Family history of malignant neoplasm of trachea, bronchus and lung: Secondary | ICD-10-CM | POA: Diagnosis not present

## 2020-03-02 DIAGNOSIS — Z95828 Presence of other vascular implants and grafts: Secondary | ICD-10-CM

## 2020-03-02 DIAGNOSIS — Z833 Family history of diabetes mellitus: Secondary | ICD-10-CM | POA: Insufficient documentation

## 2020-03-02 DIAGNOSIS — Z452 Encounter for adjustment and management of vascular access device: Secondary | ICD-10-CM | POA: Insufficient documentation

## 2020-03-02 DIAGNOSIS — Z800 Family history of malignant neoplasm of digestive organs: Secondary | ICD-10-CM | POA: Diagnosis not present

## 2020-03-02 DIAGNOSIS — C3411 Malignant neoplasm of upper lobe, right bronchus or lung: Secondary | ICD-10-CM | POA: Diagnosis present

## 2020-03-02 DIAGNOSIS — N400 Enlarged prostate without lower urinary tract symptoms: Secondary | ICD-10-CM | POA: Diagnosis not present

## 2020-03-02 DIAGNOSIS — E876 Hypokalemia: Secondary | ICD-10-CM | POA: Insufficient documentation

## 2020-03-02 DIAGNOSIS — Z791 Long term (current) use of non-steroidal anti-inflammatories (NSAID): Secondary | ICD-10-CM | POA: Diagnosis not present

## 2020-03-02 DIAGNOSIS — Z79899 Other long term (current) drug therapy: Secondary | ICD-10-CM | POA: Diagnosis not present

## 2020-03-02 MED ORDER — SODIUM CHLORIDE 0.9% FLUSH
10.0000 mL | Freq: Once | INTRAVENOUS | Status: AC
  Administered 2020-03-02: 10 mL via INTRAVENOUS
  Filled 2020-03-02: qty 10

## 2020-03-02 MED ORDER — HEPARIN SOD (PORK) LOCK FLUSH 100 UNIT/ML IV SOLN
INTRAVENOUS | Status: AC
  Filled 2020-03-02: qty 5

## 2020-03-02 MED ORDER — HEPARIN SOD (PORK) LOCK FLUSH 100 UNIT/ML IV SOLN
500.0000 [IU] | Freq: Once | INTRAVENOUS | Status: AC
  Administered 2020-03-02: 500 [IU] via INTRAVENOUS
  Filled 2020-03-02: qty 5

## 2020-03-10 ENCOUNTER — Other Ambulatory Visit: Payer: Self-pay

## 2020-03-10 DIAGNOSIS — C3491 Malignant neoplasm of unspecified part of right bronchus or lung: Secondary | ICD-10-CM

## 2020-03-11 ENCOUNTER — Other Ambulatory Visit: Payer: Self-pay

## 2020-03-11 ENCOUNTER — Inpatient Hospital Stay: Payer: Medicare Other

## 2020-03-11 ENCOUNTER — Ambulatory Visit
Admission: RE | Admit: 2020-03-11 | Discharge: 2020-03-11 | Disposition: A | Discharge status: Home / Self Care | Payer: Medicare Other | Source: Ambulatory Visit | Attending: Oncology | Admitting: Oncology

## 2020-03-11 DIAGNOSIS — C3491 Malignant neoplasm of unspecified part of right bronchus or lung: Secondary | ICD-10-CM | POA: Insufficient documentation

## 2020-03-11 DIAGNOSIS — E876 Hypokalemia: Secondary | ICD-10-CM

## 2020-03-11 DIAGNOSIS — Z95828 Presence of other vascular implants and grafts: Secondary | ICD-10-CM

## 2020-03-11 DIAGNOSIS — Z452 Encounter for adjustment and management of vascular access device: Secondary | ICD-10-CM | POA: Diagnosis not present

## 2020-03-11 LAB — CBC WITH DIFFERENTIAL/PLATELET
Abs Immature Granulocytes: 0.02 10*3/uL (ref 0.00–0.07)
Basophils Absolute: 0 10*3/uL (ref 0.0–0.1)
Basophils Relative: 0 %
Eosinophils Absolute: 0.2 10*3/uL (ref 0.0–0.5)
Eosinophils Relative: 2 %
HCT: 41.3 % (ref 39.0–52.0)
Hemoglobin: 14.7 g/dL (ref 13.0–17.0)
Immature Granulocytes: 0 %
Lymphocytes Relative: 12 %
Lymphs Abs: 0.8 10*3/uL (ref 0.7–4.0)
MCH: 29.6 pg (ref 26.0–34.0)
MCHC: 35.6 g/dL (ref 30.0–36.0)
MCV: 83.3 fL (ref 80.0–100.0)
Monocytes Absolute: 0.8 10*3/uL (ref 0.1–1.0)
Monocytes Relative: 11 %
Neutro Abs: 4.9 10*3/uL (ref 1.7–7.7)
Neutrophils Relative %: 75 %
Platelets: 302 10*3/uL (ref 150–400)
RBC: 4.96 MIL/uL (ref 4.22–5.81)
RDW: 13.9 % (ref 11.5–15.5)
WBC: 6.7 10*3/uL (ref 4.0–10.5)
nRBC: 0 % (ref 0.0–0.2)

## 2020-03-11 LAB — COMPREHENSIVE METABOLIC PANEL
ALT: 18 U/L (ref 0–44)
AST: 22 U/L (ref 15–41)
Albumin: 4.1 g/dL (ref 3.5–5.0)
Alkaline Phosphatase: 58 U/L (ref 38–126)
Anion gap: 12 (ref 5–15)
BUN: 11 mg/dL (ref 8–23)
CO2: 27 mmol/L (ref 22–32)
Calcium: 8.8 mg/dL — ABNORMAL LOW (ref 8.9–10.3)
Chloride: 101 mmol/L (ref 98–111)
Creatinine, Ser: 0.8 mg/dL (ref 0.61–1.24)
GFR calc Af Amer: >60 mL/min (ref >60)
GFR calc non Af Amer: >60 mL/min (ref >60)
Glucose, Bld: 120 mg/dL — ABNORMAL HIGH (ref 70–99)
Potassium: 3.4 mmol/L — ABNORMAL LOW (ref 3.5–5.1)
Sodium: 140 mmol/L (ref 135–145)
Total Bilirubin: 0.6 mg/dL (ref 0.3–1.2)
Total Protein: 7.7 g/dL (ref 6.5–8.1)

## 2020-03-11 LAB — TSH: TSH: 2.062 u[IU]/mL (ref 0.350–4.500)

## 2020-03-11 MED ORDER — IOHEXOL 300 MG/ML  SOLN
75.0000 mL | Freq: Once | INTRAMUSCULAR | Status: AC | PRN
  Administered 2020-03-11: 75 mL via INTRAVENOUS

## 2020-03-15 ENCOUNTER — Other Ambulatory Visit: Payer: Self-pay

## 2020-03-15 ENCOUNTER — Encounter: Payer: Self-pay | Admitting: Oncology

## 2020-03-15 ENCOUNTER — Inpatient Hospital Stay (HOSPITAL_BASED_OUTPATIENT_CLINIC_OR_DEPARTMENT_OTHER): Payer: Medicare Other | Admitting: Oncology

## 2020-03-15 VITALS — BP 148/88 | HR 74 | Temp 97.3°F | Resp 18 | Wt 224.7 lb

## 2020-03-15 DIAGNOSIS — Z95828 Presence of other vascular implants and grafts: Secondary | ICD-10-CM | POA: Diagnosis not present

## 2020-03-15 DIAGNOSIS — Z452 Encounter for adjustment and management of vascular access device: Secondary | ICD-10-CM | POA: Diagnosis not present

## 2020-03-15 DIAGNOSIS — C3491 Malignant neoplasm of unspecified part of right bronchus or lung: Secondary | ICD-10-CM

## 2020-03-15 DIAGNOSIS — K769 Liver disease, unspecified: Secondary | ICD-10-CM | POA: Insufficient documentation

## 2020-03-15 NOTE — Progress Notes (Signed)
Patient denies new problems/concerns today.   °

## 2020-03-15 NOTE — Progress Notes (Signed)
Hematology/Oncology Follow up note [facility] [phone] [phone]   Patient Care Team: [name], MD as PCP - General (Internal Medicine) [name], RN as Registered Nurse [name], MD as Consulting Physician (Oncology) [name], MD as Referring Physician (Radiation Oncology) [name], MD as Referring Physician (Vascular Surgery)   REASON FOR VISIT Follow up for immunotherapy treatment for stage IIIA lung adenocarcinoma  Oncology History:  BRALIN GARRY is a  69 y.o.  male with  adenocarcinoma of lung, diagnosed via biopsy of right hilar lymph nodes.  Former 43 pack year smoking history.  Presents for assessment prior to concurrent chemo T3 N1 M0 Status post concurrent chemoradiation.   03/17/2019 Finished 1 year of durvalumab treatment.  INTERVAL HISTORY DRAE MITZEL is a 69 y.o. male who has above history reviewed by me presents for follow-up of stage III A non-small cell lung cancer. He has no new complaints.  He had CT scan don e prior to today's work up.    . Review of Systems  Constitutional: Negative for appetite change, chills, fatigue, fever and unexpected weight change.  [name]:   Negative for hearing loss and voice change.   Eyes: Negative for eye problems and icterus.  Respiratory: Negative for chest tightness, cough and shortness of breath.   Cardiovascular: Negative for chest pain and leg swelling.  Gastrointestinal: Negative for abdominal distention and abdominal pain.  Endocrine: Negative for hot flashes.  Genitourinary: Negative for difficulty urinating, dysuria and frequency.   Musculoskeletal: Negative for arthralgias.  Skin: Negative for itching and rash.  Neurological: Negative for headaches, light-headedness and numbness.  [name]: Negative for adenopathy. Does not bruise/bleed easily.  Psychiatric/Behavioral: Negative for confusion.     MEDICAL HISTORY:  Past Medical  History:  Diagnosis Date  . Aortic insufficiency   . Asthma   . BPH (benign prostatic hyperplasia)   . Cancer ([place])    lung  . Cataract    left cataract surgery  . Centrilobular emphysema ([place])   . Centrilobular emphysema ([place])   . Colon adenomas   . Diverticulosis   . Duodenitis   . Dysphagia   . Dyspnea   . [name]   . ED (erectile dysfunction)   . Gastritis   . GERD (gastroesophageal reflux disease)   . [name]   . Hyperlipidemia   . Hypertension   . Hyperuricemia   . Left ventricular diastolic dysfunction   . Neuromuscular disorder ([place])    pt has knees bilateral with tendon issues that cause pain  . [name] (peptic ulcer disease)   . [name]     SURGICAL HISTORY: Past Surgical History:  Procedure Laterality Date  . CARDIAC CATHETERIZATION N/A 12/16/2014   Procedure: Left Heart Cath;  Surgeon: [name], MD;  Location: [facility];  Service: Cardiovascular;  Laterality: N/A;  . COLONOSCOPY    . COLONOSCOPY WITH PROPOFOL N/A 01/14/2020   Procedure: COLONOSCOPY WITH PROPOFOL;  Surgeon: Toledo, Benay Pike, MD;  Location: [facility];  Service: Gastroenterology;  Laterality: N/A;  . CYST REMOVAL TRUNK     chest and back over time and it was removed  . CYSTECTOMY    . ENDOBRONCHIAL ULTRASOUND N/A 12/09/2017   Procedure: ENDOBRONCHIAL ULTRASOUND;  Surgeon: [name], MD;  Location: [name];  Service: Pulmonary;  Laterality: N/A;  . EYE SURGERY    . left cataract surgery    . PORTA CATH INSERTION N/A 12/23/2017   Procedure: Glori Luis  CATH INSERTION;  Surgeon: [name], MD;  Location: [facility];  Service: Cardiovascular;  Laterality: N/A;  . UPPER GI ENDOSCOPY      SOCIAL HISTORY: Social History   Socioeconomic History  . Marital status: Divorced    Spouse name: Not on file  . Number of children: Not on file  . Years of education: Not on file  . Highest education level: Not on file   Occupational History  . Not on file  Tobacco Use  . Smoking status: Former Smoker    Packs/day: 1.50    Years: 35.00    Pack years: 52.50    Types: Cigarettes    Quit date: 06/22/2015    Years since quitting: 4.7  . Smokeless tobacco: Never Used  Vaping Use  . Vaping Use: Never used  Substance and Sexual Activity  . Alcohol use: Yes    Comment: weekends  . Drug use: No  . Sexual activity: Not Currently  Other Topics Concern  . Not on file  Social History Narrative  . Not on file   Social Determinants of Health   Financial Resource Strain:   . Difficulty of Paying Living Expenses: Not on file  Food Insecurity:   . Worried About Charity fundraiser in the Last Year: Not on file  . Ran Out of Food in the Last Year: Not on file  Transportation Needs:   . Lack of Transportation (Medical): Not on file  . Lack of Transportation (Non-Medical): Not on file  Physical Activity:   . Days of Exercise per Week: Not on file  . Minutes of Exercise per Session: Not on file  Stress:   . Feeling of Stress : Not on file  Social Connections:   . Frequency of Communication with Friends and Family: Not on file  . Frequency of Social Gatherings with Friends and Family: Not on file  . Attends Religious Services: Not on file  . Active Member of Clubs or Organizations: Not on file  . Attends Archivist Meetings: Not on file  . Marital Status: Not on file  Intimate Partner Violence:   . Fear of Current or Ex-Partner: Not on file  . Emotionally Abused: Not on file  . Physically Abused: Not on file  . Sexually Abused: Not on file    FAMILY HISTORY: Family History  Problem Relation Age of Onset  . Hypertension Mother   . Breast cancer Mother   . Heart attack Mother   . Lung cancer Father   . Heart disease Sister   . Diabetes Sister   . Colon cancer Sister     ALLERGIES:  is allergic to penicillins.  MEDICATIONS:  Current Outpatient Medications  Medication Sig Dispense  Refill  . acetaminophen (TYLENOL) 500 MG tablet Take 500 mg by mouth as needed.    [name] amLODipine (NORVASC) 10 MG tablet Take 10 mg by mouth daily.    [name] ascorbic acid (VITAMIN C) 1000 MG tablet Take by mouth.    [name] aspirin 81 MG tablet Take 81 mg by mouth daily.    . carvedilol (COREG) 25 MG tablet Take 25 mg by mouth 2 (two) times daily with a meal.    . Cholecalciferol 50 MCG (2000 UT) TABS Take by mouth.    . gabapentin (NEURONTIN) 300 MG capsule Take 300 mg by mouth at bedtime as needed.     [name] HYDROcodone-acetaminophen (NORCO/VICODIN) 5-325 MG tablet Take 1 tablet by mouth 2 (two) times  daily as needed.    [name] ibuprofen (ADVIL,MOTRIN) 600 MG tablet Take 600 mg by mouth every 6 (six) hours as needed.    . lidocaine-prilocaine (EMLA) cream Apply to port area once 30 g 3  . lisinopril (PRINIVIL,ZESTRIL) 40 MG tablet Take 40 mg by mouth daily.    . potassium chloride (KLOR-CON) 10 MEQ tablet TAKE 1 TABLET BY MOUTH EVERY DAY 90 tablet 0  . pravastatin (PRAVACHOL) 20 MG tablet Take 20 mg by mouth at bedtime.    . tamsulosin (FLOMAX) 0.4 MG CAPS capsule Take 0.4 mg by mouth daily.    [name] albuterol (PROVENTIL HFA;VENTOLIN HFA) 108 (90 Base) MCG/ACT inhaler Inhale 2 puffs into the lungs every 6 (six) hours as needed. (Patient not taking: Reported on 03/15/2020)  12  . fluticasone (FLONASE) 50 MCG/ACT nasal spray Place into both nostrils daily. (Patient not taking: Reported on 03/15/2020)    . prochlorperazine (COMPAZINE) 10 MG tablet Take 1 tablet (10 mg total) by mouth every 6 (six) hours as needed (Nausea or vomiting). (Patient not taking: Reported on 03/15/2020) 30 tablet 1  . senna (SENOKOT) 8.6 MG TABS tablet Take 2 tablets (17.2 mg total) by mouth daily. (Patient not taking: Reported on 11/11/2019) 120 each 0  . zolpidem (AMBIEN) 5 MG tablet Take 1 tablet by mouth. (Patient not taking: Reported on 01/14/2020)     No current facility-administered medications for this visit.   Facility-Administered  Medications Ordered in Other Visits  Medication Dose Route Frequency Provider Last Rate Last Admin  . sodium chloride flush (NS) 0.9 % injection 10 mL  10 mL Intravenous PRN [name], MD   10 mL at 03/18/18 0858     PHYSICAL EXAMINATION: ECOG PERFORMANCE STATUS: 0 - Asymptomatic Vitals:   03/15/20 1020  BP: (!) 148/88  Pulse: 74  Resp: 18  Temp: (!) 97.3 F (36.3 C)   Filed Weights   03/15/20 1020  Weight: 224 lb 11.2 oz (101.9 kg)    Physical Exam Constitutional:      General: He is not in acute distress. [name]:     Head: Normocephalic and atraumatic.  Eyes:     General: No scleral icterus.    Conjunctiva/sclera: Conjunctivae normal.     Pupils: Pupils are equal, round, and reactive to light.  Cardiovascular:     Rate and Rhythm: Normal rate and regular rhythm.     Heart sounds: Normal heart sounds.  Pulmonary:     Effort: Pulmonary effort is normal. No respiratory distress.     Breath sounds: No wheezing or rales.     Comments: Decreased breath sound bilaterally Chest:     Chest wall: No tenderness.  Abdominal:     General: Bowel sounds are normal. There is no distension.     Palpations: Abdomen is soft. There is no mass.     Tenderness: There is no abdominal tenderness.  Musculoskeletal:        General: No deformity. Normal range of motion.     Cervical back: Normal range of motion and neck supple.  Lymphadenopathy:     Cervical: No cervical adenopathy.  Skin:    General: Skin is warm and dry.     Findings: No erythema or rash.     Comments: Chest wall Mediport site no erythema or discharge. Left supraclavicular fossa fullness, chronic.  Neurological:     Mental Status: He is alert and oriented to person, place, and time. Mental status is at baseline.  Cranial Nerves: No cranial nerve deficit.     Coordination: Coordination normal.  Psychiatric:        Mood and Affect: Mood normal.      LABORATORY DATA:  I have reviewed the data as listed Lab  Results  Component Value Date   WBC 6.7 03/11/2020   HGB 14.7 03/11/2020   HCT 41.3 03/11/2020   MCV 83.3 03/11/2020   PLT 302 03/11/2020   Recent Labs    09/29/19 1302 11/11/19 0926 03/11/20 0857  NA 137 140 140  K 3.2* 3.5 3.4*  CL 101 103 101  CO2 28 29 27   GLUCOSE 113* 118* 120*  BUN 13 15 11   CREATININE 0.79 1.02 0.80  CALCIUM 8.9 9.0 8.8*  GFRNONAA >60 >60 >60  GFRAA >60 >60 >60  PROT 7.2 7.1 7.7  ALBUMIN 4.0 3.9 4.1  AST 23 23 22   ALT 21 22 18   ALKPHOS 61 59 58  BILITOT 0.4 0.7 0.6   RADIOGRAPHIC STUDIES: I have personally reviewed the radiological images as listed and agreed with the findings in the report..  03/21/2018 CT chest showed mild decreasing size of the right upper lobe pulmonary nodule.  Interval decrease in size of right paratracheal and right hilar adenopathy.  06/30/2018 CT chest showed interval development of extensive changes secondary to external beam radiation within the posterior right upper lobe and superior segment of right lower lobe.  The enlarged right paratracheal lymph node is decreased in size in the interval.  Right upper lobe lung lesion is difficult to separate from surrounding changes due to radiation but is favored to have decrease in size in the interval.  Aortic atherosclerosis and emphysema.  LAD coronary artery atherosclerotic calcification.  08/06/2018 CT head without contrast Brain parenchymal appears unremarkable there is no mass or hemorrhage.    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 Continued further decrease in the size of previously identified right.  Tracheal lymph node. Aortic atherosclerosis.  Emphysema.  CT Chest W Contrast  Result Date: 03/11/2020 CLINICAL DATA:  Follow-up right lung cancer EXAM: CT CHEST WITH CONTRAST TECHNIQUE: Multidetector CT imaging of the chest was performed during intravenous contrast  administration. CONTRAST:  33mL OMNIPAQUE IOHEXOL 300 MG/ML  SOLN COMPARISON:  11/09/2019, 08/14/2019 FINDINGS: Cardiovascular: Left chest port catheter. Scattered aortic atherosclerosis. Normal heart size. Scattered coronary artery calcifications. No pericardial effusion. Mediastinum/Nodes: Unchanged post treatment appearance of soft tissue about the right hilum and prominent mediastinal and AP window lymph nodes (series 2, image 57). Thyroid gland, trachea, and esophagus demonstrate no significant findings. Lungs/Pleura: Severe centrilobular emphysema. Unchanged, predominantly bandlike post treatment consolidation and fibrosis of the paramedian and suprahilar right lung (series 3, image 53). Unchanged irregular nodule of the left pulmonary apex measuring approximately 6 mm (series 3, image 25). Additional tiny pulmonary nodules, for example in the lateral segment right middle lobe (series 3, image 39) are unchanged. No pleural effusion or pneumothorax. Upper Abdomen: No acute abnormality. Hepatic steatosis. There are multiple subtle, ill-defined hyperenhancing lesions throughout the liver parenchyma, most conspicuously in the anterior liver dome measuring approximately 1.6 cm (series 2, image 129). These are not significantly changed compared to most recent contrast enhanced examination dated 08/14/2019. Musculoskeletal: No chest wall mass or suspicious bone lesions identified. IMPRESSION: 1. Unchanged, predominantly bandlike post treatment consolidation and fibrosis of the paramedian and suprahilar right lung. No evidence of local recurrence. 2. Unchanged small pulmonary  nodules. 3. Unchanged post treatment appearance of soft tissue about the right hilum and prominent mediastinal and AP window lymph nodes. 4. There are multiple subtle, ill-defined hyperenhancing lesions throughout the liver parenchyma, most conspicuously in the anterior liver dome. These are of uncertain nature, not typical in appearance for  metastases, possibly reflecting regenerative nodules or arterially hyperenhancing lesions of the liver in the setting of chronic liver disease. Recommend multiphasic contrast enhanced MRI the liver to further evaluate. 5. Emphysema (ICD10-J43.9). 6. Coronary artery disease. Aortic Atherosclerosis (ICD10-I70.0). Electronically Signed   By: Eddie Candle M.D.   On: 03/11/2020 11:53    ASSESSMENT & PLAN:  1. Primary lung adenocarcinoma, right ([place])   2. Port-A-Cath in place   3. Liver lesion    Stage IIIA T3 N1 M0 lung adenocarcinoma, s/p concurrent chemoradiation.  Finished 1 year of durvalumab treatments. Labs are reviewed and discussed with patient. CT images were independently reviewed and discussed with patient. No new intrathoracic disease progression or new disease. Multiple ill-defined liver nodules, etiology unknown. We will obtain MRI liver with and without contrast for further evaluation.  Chronic hypokalemia, potassium is stable at 3.4.  Continue oral potassium chloride 10 mEq daily. Port-A-Cath in place, continue port flush every 6 to 8 weeks. We discussed about port discontinuation.  I recommend to wait until MRI crepitation of the liver nodules.  He agrees.  He decides to continue port flush and keep it for now. We spent sufficient time to discuss many aspect of care, questions were answered to patient's satisfaction.  Return visit, lab MD 4 months, after CT scan. We spent sufficient time to discuss many aspect of care, questions were answered to patient's satisfaction.   [name], MD, PhD

## 2020-04-01 ENCOUNTER — Ambulatory Visit
Admission: RE | Admit: 2020-04-01 | Discharge: 2020-04-01 | Disposition: A | Discharge status: Home / Self Care | Payer: Medicare Other | Source: Ambulatory Visit | Attending: Oncology | Admitting: Oncology

## 2020-04-01 ENCOUNTER — Other Ambulatory Visit: Payer: Self-pay

## 2020-04-01 DIAGNOSIS — K769 Liver disease, unspecified: Secondary | ICD-10-CM | POA: Insufficient documentation

## 2020-04-01 DIAGNOSIS — C3491 Malignant neoplasm of unspecified part of right bronchus or lung: Secondary | ICD-10-CM | POA: Diagnosis present

## 2020-04-01 MED ORDER — GADOBUTROL 1 MMOL/ML IV SOLN
7.0000 mL | Freq: Once | INTRAVENOUS | Status: AC | PRN
  Administered 2020-04-01: 7 mL via INTRAVENOUS

## 2020-04-04 ENCOUNTER — Other Ambulatory Visit: Payer: Self-pay

## 2020-04-04 ENCOUNTER — Telehealth: Payer: Self-pay

## 2020-04-04 DIAGNOSIS — C3491 Malignant neoplasm of unspecified part of right bronchus or lung: Secondary | ICD-10-CM

## 2020-04-04 DIAGNOSIS — K769 Liver disease, unspecified: Secondary | ICD-10-CM

## 2020-04-04 NOTE — Telephone Encounter (Signed)
04/04/2020  Liver MRI scheduled for 07/12/19 @ 11 am Srw

## 2020-04-04 NOTE — Telephone Encounter (Signed)
Pt notified of results and aware of MRI/ follow up appts.

## 2020-04-04 NOTE — Telephone Encounter (Signed)
Pt is scheduled to return to clinic on 1/18. Please schedule MRI Liver a few days prior to MD appt. I will call pt with appt once scheduled. Thank you.

## 2020-04-04 NOTE — Telephone Encounter (Signed)
-----   Message from [name], MD sent at 04/01/2020 11:16 PM EDT ----- Please let patient know that MRI result is not bad, no definitive evidence of cancer, need to repeat image in 3 months.  Please arrange him to repeat MRI liver w wo contrast in 3 months, then see me. Thanks.

## 2020-04-05 ENCOUNTER — Ambulatory Visit: Payer: Medicare Other | Admitting: Urology

## 2020-04-07 ENCOUNTER — Other Ambulatory Visit: Payer: Medicare Other

## 2020-04-12 ENCOUNTER — Ambulatory Visit: Payer: Medicare Other | Admitting: Urology

## 2020-04-14 ENCOUNTER — Ambulatory Visit: Payer: Medicare Other | Admitting: Urology

## 2020-04-18 ENCOUNTER — Other Ambulatory Visit
Admission: RE | Admit: 2020-04-18 | Discharge: 2020-04-18 | Disposition: A | Discharge status: Home / Self Care | Payer: Medicare Other | Attending: Urology | Admitting: Urology

## 2020-04-18 ENCOUNTER — Other Ambulatory Visit: Payer: Self-pay

## 2020-04-18 DIAGNOSIS — Z125 Encounter for screening for malignant neoplasm of prostate: Secondary | ICD-10-CM | POA: Diagnosis present

## 2020-04-18 NOTE — Addendum Note (Signed)
Addended by: Memory Argue H on: 04/18/2020 01:36 PM   Modules accepted: Orders

## 2020-04-19 ENCOUNTER — Encounter: Payer: Self-pay | Admitting: Urology

## 2020-04-19 ENCOUNTER — Other Ambulatory Visit: Payer: Self-pay

## 2020-04-19 ENCOUNTER — Ambulatory Visit: Payer: Medicare Other | Admitting: Urology

## 2020-04-19 VITALS — BP 125/74 | HR 76 | Ht 72.0 in | Wt 225.2 lb

## 2020-04-19 DIAGNOSIS — R351 Nocturia: Secondary | ICD-10-CM | POA: Diagnosis not present

## 2020-04-19 DIAGNOSIS — Z125 Encounter for screening for malignant neoplasm of prostate: Secondary | ICD-10-CM

## 2020-04-19 LAB — BLADDER SCAN AMB NON-IMAGING

## 2020-04-19 LAB — PSA: Prostatic Specific Antigen: 3.09 ng/mL (ref 0.00–4.00)

## 2020-04-19 NOTE — Progress Notes (Signed)
   04/19/2020 1:29 PM   Mark Mccarty 04/02/1951 [id number]  Reason for visit: Follow up PSA screening, BPH/nocturia  HPI: I saw [name] in urology clinic for follow-up of the above issues.  He is a 69 year old male who completed treatment last year for stage III lung cancer and continues to follow with oncology for close surveillance imaging.  He denies any major changes in his health over the last year.  We have been following him on a yearly basis for PSA screening, and PSA this year is stable at 3.1 from 3.1 last year, and 3.2 the year before.  In terms of his urinary symptoms, he has nocturia 1-3 times per night, but denies any significant urinary symptoms during the day.  He is on Flomax long-term prescribed by his PCP.  He denies any history of UTI or urinary retention.  He will drink some alcohol in the evenings before bed, as well as occasional soda.  He is not sure if he snores, and has never been evaluated for sleep apnea.  He denies any gross hematuria or dysuria.  We reviewed the risks and benefits of PSA screening, and reviewed the AUA guidelines.  We discussed that we can likely discontinue screening next year based on his age as well as his comorbidities.  Regarding his urinary symptoms, I recommended continuing Flomax, and we discussed behavioral strategies at length including minimizing bladder irritants, minimizing fluids before bed, and voiding prior to bedtime.  I also recommended considering evaluation for sleep apnea.  RTC 1 year with PSA prior, IPSS, PVR  Billey Co, MD  [facility] [address], [place], [place] [address] [phone]

## 2020-04-19 NOTE — Patient Instructions (Signed)
Prostate Cancer Screening  Prostate cancer screening is a test that is done to check for the presence of prostate cancer in men. The prostate gland is a walnut-sized gland that is located below the bladder and in front of the rectum in males. The function of the prostate is to add fluid to semen during ejaculation. Prostate cancer is the second most common type of cancer in men. Who should have prostate cancer screening?  Screening recommendations vary based on age and other risk factors. Screening is recommended if:  You are older than age 55. If you are age 55-69, talk with your health care provider about your need for screening and how often screening should be done. Because most prostate cancers are slow growing and will not cause death, screening is generally reserved in this age group for men who have a 10-15-year life expectancy.  You are younger than age 55, and you have these risk factors: ? Being a black male or a male of African descent. ? Having a father, brother, or uncle who has been diagnosed with prostate cancer. The risk is higher if your family member's cancer occurred at an early age. Screening is not recommended if:  You are younger than age 40.  You are between the ages of 40 and 54 and you have no risk factors.  You are 70 years of age or older. At this age, the risks that screening can cause are greater than the benefits that it may provide. If you are at high risk for prostate cancer, your health care provider may recommend that you have screenings more often or that you start screening at a younger age. How is screening for prostate cancer done? The recommended prostate cancer screening test is a blood test called the prostate-specific antigen (PSA) test. PSA is a protein that is made in the prostate. As you age, your prostate naturally produces more PSA. Abnormally high PSA levels may be caused by:  Prostate cancer.  An enlarged prostate that is not caused by cancer  (benign prostatic hyperplasia, BPH). This condition is very common in older men.  A prostate gland infection (prostatitis). Depending on the PSA results, you may need more tests, such as:  A physical exam to check the size of your prostate gland.  Blood and imaging tests.  A procedure to remove tissue samples from your prostate gland for testing (biopsy). What are the benefits of prostate cancer screening?  Screening can help to identify cancer at an early stage, before symptoms start and when the cancer can be treated more easily.  There is a small chance that screening may lower your risk of dying from prostate cancer. The chance is small because prostate cancer is a slow-growing cancer, and most men with prostate cancer die from a different cause. What are the risks of prostate cancer screening? The main risk of prostate cancer screening is diagnosing and treating prostate cancer that would never have caused any symptoms or problems. This is called overdiagnosisand overtreatment. PSA screening cannot tell you if your PSA is high due to cancer or a different cause. A prostate biopsy is the only procedure to diagnose prostate cancer. Even the results of a biopsy may not tell you if your cancer needs to be treated. Slow-growing prostate cancer may not need any treatment other than monitoring, so diagnosing and treating it may cause unnecessary stress or other side effects. A prostate biopsy may also cause:  Infection or fever.  A false negative. This is   a result that shows that you do not have prostate cancer when you actually do have prostate cancer. Questions to ask your health care provider  When should I start prostate cancer screening?  What is my risk for prostate cancer?  How often do I need screening?  What type of screening tests do I need?  How do I get my test results?  What do my results mean?  Do I need treatment? Where to find more information  The American Cancer  Society: www.cancer.org  American Urological Association: www.auanet.org Contact a health care provider if:  You have difficulty urinating.  You have pain when you urinate or ejaculate.  You have blood in your urine or semen.  You have pain in your back or in the area of your prostate. Summary  Prostate cancer is a common type of cancer in men. The prostate gland is located below the bladder and in front of the rectum. This gland adds fluid to semen during ejaculation.  Prostate cancer screening may identify cancer at an early stage, when the cancer can be treated more easily.  The prostate-specific antigen (PSA) test is the recommended screening test for prostate cancer.  Discuss the risks and benefits of prostate cancer screening with your health care provider. If you are age 70 or older, the risks that screening can cause are greater than the benefits that it may provide. This information is not intended to replace advice given to you by your health care provider. Make sure you discuss any questions you have with your health care provider. Document Revised: 01/29/2019 Document Reviewed: 01/29/2019 Elsevier Patient Education  2020 Elsevier Inc.  

## 2020-05-10 ENCOUNTER — Other Ambulatory Visit: Payer: Self-pay

## 2020-05-10 ENCOUNTER — Inpatient Hospital Stay: Payer: Medicare Other | Attending: Oncology

## 2020-05-10 DIAGNOSIS — Z452 Encounter for adjustment and management of vascular access device: Secondary | ICD-10-CM | POA: Insufficient documentation

## 2020-05-10 DIAGNOSIS — C3411 Malignant neoplasm of upper lobe, right bronchus or lung: Secondary | ICD-10-CM | POA: Diagnosis present

## 2020-05-10 DIAGNOSIS — Z95828 Presence of other vascular implants and grafts: Secondary | ICD-10-CM

## 2020-05-10 MED ORDER — HEPARIN SOD (PORK) LOCK FLUSH 100 UNIT/ML IV SOLN
INTRAVENOUS | Status: AC
  Filled 2020-05-10: qty 5

## 2020-05-10 MED ORDER — SODIUM CHLORIDE 0.9% FLUSH
10.0000 mL | Freq: Once | INTRAVENOUS | Status: AC
  Administered 2020-05-10: 10 mL via INTRAVENOUS
  Filled 2020-05-10: qty 10

## 2020-05-10 MED ORDER — HEPARIN SOD (PORK) LOCK FLUSH 100 UNIT/ML IV SOLN
500.0000 [IU] | Freq: Once | INTRAVENOUS | Status: AC
  Administered 2020-05-10: 500 [IU] via INTRAVENOUS
  Filled 2020-05-10: qty 5

## 2020-05-24 ENCOUNTER — Other Ambulatory Visit: Payer: Self-pay | Admitting: Oncology

## 2020-05-24 NOTE — Telephone Encounter (Signed)
   Ref Range & Units 2 mo ago 6 mo ago  Potassium 3.5 - 5.1 mmol/L 3.4Low  3.5

## 2020-07-11 ENCOUNTER — Ambulatory Visit
Admission: RE | Admit: 2020-07-11 | Discharge: 2020-07-11 | Disposition: A | Discharge status: Home / Self Care | Payer: Medicare Other | Source: Ambulatory Visit | Attending: Oncology | Admitting: Oncology

## 2020-07-11 ENCOUNTER — Other Ambulatory Visit: Payer: Self-pay

## 2020-07-11 DIAGNOSIS — K769 Liver disease, unspecified: Secondary | ICD-10-CM | POA: Diagnosis present

## 2020-07-11 DIAGNOSIS — C3491 Malignant neoplasm of unspecified part of right bronchus or lung: Secondary | ICD-10-CM | POA: Diagnosis not present

## 2020-07-11 MED ORDER — GADOBUTROL 1 MMOL/ML IV SOLN
10.0000 mL | Freq: Once | INTRAVENOUS | Status: AC | PRN
  Administered 2020-07-11: 10 mL via INTRAVENOUS

## 2020-07-19 ENCOUNTER — Inpatient Hospital Stay: Payer: Medicare Other | Admitting: Oncology

## 2020-07-19 ENCOUNTER — Inpatient Hospital Stay: Payer: Medicare Other

## 2020-07-25 ENCOUNTER — Inpatient Hospital Stay: Payer: Medicare Other | Attending: Oncology

## 2020-07-25 ENCOUNTER — Inpatient Hospital Stay (HOSPITAL_BASED_OUTPATIENT_CLINIC_OR_DEPARTMENT_OTHER): Payer: Medicare Other | Admitting: Oncology

## 2020-07-25 ENCOUNTER — Encounter: Payer: Self-pay | Admitting: Oncology

## 2020-07-25 VITALS — BP 145/78 | HR 66 | Temp 96.1°F | Resp 18 | Wt 225.8 lb

## 2020-07-25 DIAGNOSIS — Z803 Family history of malignant neoplasm of breast: Secondary | ICD-10-CM | POA: Diagnosis not present

## 2020-07-25 DIAGNOSIS — K219 Gastro-esophageal reflux disease without esophagitis: Secondary | ICD-10-CM | POA: Insufficient documentation

## 2020-07-25 DIAGNOSIS — N400 Enlarged prostate without lower urinary tract symptoms: Secondary | ICD-10-CM | POA: Insufficient documentation

## 2020-07-25 DIAGNOSIS — E785 Hyperlipidemia, unspecified: Secondary | ICD-10-CM | POA: Insufficient documentation

## 2020-07-25 DIAGNOSIS — Z833 Family history of diabetes mellitus: Secondary | ICD-10-CM | POA: Diagnosis not present

## 2020-07-25 DIAGNOSIS — Z87891 Personal history of nicotine dependence: Secondary | ICD-10-CM | POA: Diagnosis not present

## 2020-07-25 DIAGNOSIS — Z95828 Presence of other vascular implants and grafts: Secondary | ICD-10-CM

## 2020-07-25 DIAGNOSIS — C3411 Malignant neoplasm of upper lobe, right bronchus or lung: Secondary | ICD-10-CM | POA: Diagnosis present

## 2020-07-25 DIAGNOSIS — K769 Liver disease, unspecified: Secondary | ICD-10-CM

## 2020-07-25 DIAGNOSIS — Z79899 Other long term (current) drug therapy: Secondary | ICD-10-CM | POA: Diagnosis not present

## 2020-07-25 DIAGNOSIS — Z8249 Family history of ischemic heart disease and other diseases of the circulatory system: Secondary | ICD-10-CM | POA: Diagnosis not present

## 2020-07-25 DIAGNOSIS — Z7982 Long term (current) use of aspirin: Secondary | ICD-10-CM | POA: Diagnosis not present

## 2020-07-25 DIAGNOSIS — Z452 Encounter for adjustment and management of vascular access device: Secondary | ICD-10-CM | POA: Diagnosis not present

## 2020-07-25 DIAGNOSIS — C3491 Malignant neoplasm of unspecified part of right bronchus or lung: Secondary | ICD-10-CM | POA: Diagnosis not present

## 2020-07-25 DIAGNOSIS — Z801 Family history of malignant neoplasm of trachea, bronchus and lung: Secondary | ICD-10-CM | POA: Diagnosis not present

## 2020-07-25 DIAGNOSIS — E876 Hypokalemia: Secondary | ICD-10-CM

## 2020-07-25 DIAGNOSIS — J45909 Unspecified asthma, uncomplicated: Secondary | ICD-10-CM | POA: Diagnosis not present

## 2020-07-25 LAB — COMPREHENSIVE METABOLIC PANEL
ALT: 19 U/L (ref 0–44)
AST: 22 U/L (ref 15–41)
Albumin: 4.2 g/dL (ref 3.5–5.0)
Alkaline Phosphatase: 55 U/L (ref 38–126)
Anion gap: 8 (ref 5–15)
BUN: 13 mg/dL (ref 8–23)
CO2: 29 mmol/L (ref 22–32)
Calcium: 8.8 mg/dL — ABNORMAL LOW (ref 8.9–10.3)
Chloride: 99 mmol/L (ref 98–111)
Creatinine, Ser: 0.83 mg/dL (ref 0.61–1.24)
GFR, Estimated: >60 mL/min (ref >60)
Glucose, Bld: 114 mg/dL — ABNORMAL HIGH (ref 70–99)
Potassium: 3.4 mmol/L — ABNORMAL LOW (ref 3.5–5.1)
Sodium: 136 mmol/L (ref 135–145)
Total Bilirubin: 0.8 mg/dL (ref 0.3–1.2)
Total Protein: 7.6 g/dL (ref 6.5–8.1)

## 2020-07-25 LAB — CBC WITH DIFFERENTIAL/PLATELET
Abs Immature Granulocytes: 0.03 10*3/uL (ref 0.00–0.07)
Basophils Absolute: 0 10*3/uL (ref 0.0–0.1)
Basophils Relative: 1 %
Eosinophils Absolute: 0.2 10*3/uL (ref 0.0–0.5)
Eosinophils Relative: 2 %
HCT: 41.3 % (ref 39.0–52.0)
Hemoglobin: 14.6 g/dL (ref 13.0–17.0)
Immature Granulocytes: 1 %
Lymphocytes Relative: 13 %
Lymphs Abs: 0.9 10*3/uL (ref 0.7–4.0)
MCH: 29.3 pg (ref 26.0–34.0)
MCHC: 35.4 g/dL (ref 30.0–36.0)
MCV: 82.8 fL (ref 80.0–100.0)
Monocytes Absolute: 0.8 10*3/uL (ref 0.1–1.0)
Monocytes Relative: 13 %
Neutro Abs: 4.7 10*3/uL (ref 1.7–7.7)
Neutrophils Relative %: 70 %
Platelets: 240 10*3/uL (ref 150–400)
RBC: 4.99 MIL/uL (ref 4.22–5.81)
RDW: 13.9 % (ref 11.5–15.5)
WBC: 6.7 10*3/uL (ref 4.0–10.5)
nRBC: 0 % (ref 0.0–0.2)

## 2020-07-25 MED ORDER — HEPARIN SOD (PORK) LOCK FLUSH 100 UNIT/ML IV SOLN
500.0000 [IU] | Freq: Once | INTRAVENOUS | Status: AC
  Administered 2020-07-25: 500 [IU] via INTRAVENOUS
  Filled 2020-07-25: qty 5

## 2020-07-25 MED ORDER — HEPARIN SOD (PORK) LOCK FLUSH 100 UNIT/ML IV SOLN
INTRAVENOUS | Status: AC
  Filled 2020-07-25: qty 5

## 2020-07-25 MED ORDER — SODIUM CHLORIDE 0.9% FLUSH
10.0000 mL | Freq: Once | INTRAVENOUS | Status: AC
  Administered 2020-07-25: 10 mL via INTRAVENOUS
  Filled 2020-07-25: qty 10

## 2020-07-25 MED ORDER — POTASSIUM CHLORIDE ER 10 MEQ PO TBCR: 10.0000 meq | EXTENDED_RELEASE_TABLET | Freq: Every day | ORAL | 1 refills | Status: DC

## 2020-07-25 NOTE — Progress Notes (Signed)
Patient here for follow up. No new concerns voiced.  °

## 2020-07-25 NOTE — Progress Notes (Signed)
Hematology/Oncology Follow up note [facility] [phone] [phone]   Patient Care Team: [name], MD as PCP - General (Internal Medicine) [name], RN as Registered Nurse [name], MD as Consulting Physician (Oncology) [name], MD as Referring Physician (Radiation Oncology) [name], MD as Referring Physician (Vascular Surgery)   REASON FOR VISIT Follow up for immunotherapy treatment for stage IIIA lung adenocarcinoma  Oncology History:  Mark Mccarty is a  70 y.o.  male with  adenocarcinoma of lung, diagnosed via biopsy of right hilar lymph nodes.  Former 43 pack year smoking history.  Presents for assessment prior to concurrent chemo T3 N1 M0 August 2019 Finished  concurrent chemoradiation.   03/17/2019 Finished 1 year of durvalumab treatment.  INTERVAL HISTORY Mark Mccarty is a 70 y.o. male who has above history reviewed by me presents for follow-up of stage III A non-small cell lung cancer. He had MRI liver done for follow up of his liver lesions.  No new complaints.   . Review of Systems  Constitutional: Negative for appetite change, chills, fatigue, fever and unexpected weight change.  [name]:   Negative for hearing loss and voice change.   Eyes: Negative for eye problems and icterus.  Respiratory: Negative for chest tightness, cough and shortness of breath.   Cardiovascular: Negative for chest pain and leg swelling.  Gastrointestinal: Negative for abdominal distention and abdominal pain.  Endocrine: Negative for hot flashes.  Genitourinary: Negative for difficulty urinating, dysuria and frequency.   Musculoskeletal: Negative for arthralgias.  Skin: Negative for itching and rash.  Neurological: Negative for headaches, light-headedness and numbness.  [name]: Negative for adenopathy. Does not bruise/bleed easily.  Psychiatric/Behavioral: Negative for confusion.     MEDICAL HISTORY:  Past  Medical History:  Diagnosis Date  . Aortic insufficiency   . Asthma   . BPH (benign prostatic hyperplasia)   . Cancer ([place])    lung  . Cataract    left cataract surgery  . Centrilobular emphysema ([place])   . Centrilobular emphysema ([place])   . Colon adenomas   . Diverticulosis   . Duodenitis   . Dysphagia   . Dyspnea   . [name]   . ED (erectile dysfunction)   . Gastritis   . GERD (gastroesophageal reflux disease)   . [name]   . Hyperlipidemia   . Hypertension   . Hyperuricemia   . Left ventricular diastolic dysfunction   . Neuromuscular disorder ([place])    pt has knees bilateral with tendon issues that cause pain  . [name] (peptic ulcer disease)   . [name]     SURGICAL HISTORY: Past Surgical History:  Procedure Laterality Date  . CARDIAC CATHETERIZATION N/A 12/16/2014   Procedure: Left Heart Cath;  Surgeon: [name], MD;  Location: [facility];  Service: Cardiovascular;  Laterality: N/A;  . COLONOSCOPY    . COLONOSCOPY WITH PROPOFOL N/A 01/14/2020   Procedure: COLONOSCOPY WITH PROPOFOL;  Surgeon: Toledo, Benay Pike, MD;  Location: [facility];  Service: Gastroenterology;  Laterality: N/A;  . CYST REMOVAL TRUNK     chest and back over time and it was removed  . CYSTECTOMY    . ENDOBRONCHIAL ULTRASOUND N/A 12/09/2017   Procedure: ENDOBRONCHIAL ULTRASOUND;  Surgeon: [name], MD;  Location: [name];  Service: Pulmonary;  Laterality: N/A;  . EYE SURGERY    . left cataract surgery    . PORTA CATH INSERTION N/A 12/23/2017   Procedure: Glori Luis  CATH INSERTION;  Surgeon: [name], MD;  Location: [facility];  Service: Cardiovascular;  Laterality: N/A;  . UPPER GI ENDOSCOPY      SOCIAL HISTORY: Social History   Socioeconomic History  . Marital status: Divorced    Spouse name: Not on file  . Number of children: Not on file  . Years of education: Not on file  . Highest education level: Not on  file  Occupational History  . Not on file  Tobacco Use  . Smoking status: Former Smoker    Packs/day: 1.50    Years: 35.00    Pack years: 52.50    Types: Cigarettes    Quit date: 06/22/2015    Years since quitting: 5.0  . Smokeless tobacco: Never Used  Vaping Use  . Vaping Use: Never used  Substance and Sexual Activity  . Alcohol use: Yes    Comment: weekends  . Drug use: No  . Sexual activity: Not Currently  Other Topics Concern  . Not on file  Social History Narrative  . Not on file   Social Determinants of Health   Financial Resource Strain: Not on file  Food Insecurity: Not on file  Transportation Needs: Not on file  Physical Activity: Not on file  Stress: Not on file  Social Connections: Not on file  Intimate Partner Violence: Not on file    FAMILY HISTORY: Family History  Problem Relation Age of Onset  . Hypertension Mother   . Breast cancer Mother   . Heart attack Mother   . Lung cancer Father   . Heart disease Sister   . Diabetes Sister   . Colon cancer Sister     ALLERGIES:  is allergic to penicillins.  MEDICATIONS:  Current Outpatient Medications  Medication Sig Dispense Refill  . acetaminophen (TYLENOL) 500 MG tablet Take 500 mg by mouth as needed.    [name] albuterol (PROVENTIL HFA;VENTOLIN HFA) 108 (90 Base) MCG/ACT inhaler Inhale 2 puffs into the lungs every 6 (six) hours as needed.   12  . amLODipine (NORVASC) 10 MG tablet Take 10 mg by mouth daily.    [name] ascorbic acid (VITAMIN C) 1000 MG tablet Take by mouth.    [name] aspirin 81 MG tablet Take 81 mg by mouth daily.    . carvedilol (COREG) 25 MG tablet Take 25 mg by mouth 2 (two) times daily with a meal.    . Cholecalciferol 50 MCG (2000 UT) TABS Take by mouth.    . gabapentin (NEURONTIN) 300 MG capsule Take 300 mg by mouth at bedtime as needed.     [name] HYDROcodone-acetaminophen (NORCO/VICODIN) 5-325 MG tablet Take one tablet at night for pain; may take up to every 8 hours as needed for pain if not  working or driving    . ibuprofen (ADVIL,MOTRIN) 600 MG tablet Take 600 mg by mouth every 6 (six) hours as needed.    . lidocaine-prilocaine (EMLA) cream Apply to port area once 30 g 3  . lisinopril (PRINIVIL,ZESTRIL) 40 MG tablet Take 40 mg by mouth daily.    . potassium chloride (KLOR-CON) 10 MEQ tablet TAKE 1 TABLET BY MOUTH EVERY DAY 90 tablet 0  . pravastatin (PRAVACHOL) 20 MG tablet Take 20 mg by mouth at bedtime.    . prochlorperazine (COMPAZINE) 10 MG tablet Take 1 tablet (10 mg total) by mouth every 6 (six) hours as needed (Nausea or vomiting). 30 tablet 1  . senna (SENOKOT) 8.6 MG TABS tablet Take 2 tablets (  17.2 mg total) by mouth daily. 120 each 0  . tamsulosin (FLOMAX) 0.4 MG CAPS capsule Take 0.4 mg by mouth daily.    [name] zolpidem (AMBIEN) 5 MG tablet Take 1 tablet by mouth.  (Patient not taking: Reported on 07/25/2020)     No current facility-administered medications for this visit.   Facility-Administered Medications Ordered in Other Visits  Medication Dose Route Frequency Provider Last Rate Last Admin  . sodium chloride flush (NS) 0.9 % injection 10 mL  10 mL Intravenous PRN [name], MD   10 mL at 03/18/18 0858     PHYSICAL EXAMINATION: ECOG PERFORMANCE STATUS: 0 - Asymptomatic Vitals:   07/25/20 0859  BP: (!) 145/78  Pulse: 66  Resp: 18  Temp: (!) 96.1 F (35.6 C)  SpO2: 99%   Filed Weights   07/25/20 0859  Weight: 225 lb 12.8 oz (102.4 kg)    Physical Exam Constitutional:      General: He is not in acute distress. [name]:     Head: Normocephalic and atraumatic.  Eyes:     General: No scleral icterus.    Conjunctiva/sclera: Conjunctivae normal.     Pupils: Pupils are equal, round, and reactive to light.  Cardiovascular:     Rate and Rhythm: Normal rate and regular rhythm.     Heart sounds: Normal heart sounds.  Pulmonary:     Effort: Pulmonary effort is normal. No respiratory distress.     Breath sounds: No wheezing or rales.     Comments: Decreased  breath sound bilaterally Chest:     Chest wall: No tenderness.  Abdominal:     General: Bowel sounds are normal. There is no distension.     Palpations: Abdomen is soft. There is no mass.     Tenderness: There is no abdominal tenderness.  Musculoskeletal:        General: No deformity. Normal range of motion.     Cervical back: Normal range of motion and neck supple.  Lymphadenopathy:     Cervical: No cervical adenopathy.  Skin:    General: Skin is warm and dry.     Findings: No erythema or rash.     Comments: Chest wall Mediport site no erythema or discharge. Left supraclavicular fossa fullness, chronic.  Neurological:     Mental Status: He is alert and oriented to person, place, and time. Mental status is at baseline.     Cranial Nerves: No cranial nerve deficit.     Coordination: Coordination normal.  Psychiatric:        Mood and Affect: Mood normal.      LABORATORY DATA:  I have reviewed the data as listed Lab Results  Component Value Date   WBC 6.7 07/25/2020   HGB 14.6 07/25/2020   HCT 41.3 07/25/2020   MCV 82.8 07/25/2020   PLT 240 07/25/2020   Recent Labs    09/29/19 1302 11/11/19 0926 03/11/20 0857  NA 137 140 140  K 3.2* 3.5 3.4*  CL 101 103 101  CO2 28 29 27   GLUCOSE 113* 118* 120*  BUN 13 15 11   CREATININE 0.79 1.02 0.80  CALCIUM 8.9 9.0 8.8*  GFRNONAA >60 >60 >60  GFRAA >60 >60 >60  PROT 7.2 7.1 7.7  ALBUMIN 4.0 3.9 4.1  AST 23 23 22   ALT 21 22 18   ALKPHOS 61 59 58  BILITOT 0.4 0.7 0.6   RADIOGRAPHIC STUDIES: I have personally reviewed the radiological images as listed and agreed with the findings in  the report..  03/21/2018 CT chest showed mild decreasing size of the right upper lobe pulmonary nodule.  Interval decrease in size of right paratracheal and right hilar adenopathy.  06/30/2018 CT chest showed interval development of extensive changes secondary to external beam radiation within the posterior right upper lobe and superior segment  of right lower lobe.  The enlarged right paratracheal lymph node is decreased in size in the interval.  Right upper lobe lung lesion is difficult to separate from surrounding changes due to radiation but is favored to have decrease in size in the interval.  Aortic atherosclerosis and emphysema.  LAD coronary artery atherosclerotic calcification.  08/06/2018 CT head without contrast Brain parenchymal appears unremarkable there is no mass or hemorrhage.    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 Continued further decrease in the size of previously identified right.  Tracheal lymph node. Aortic atherosclerosis.  Emphysema.  MR LIVER W WO CONTRAST  Result Date: 07/11/2020 CLINICAL DATA:  Lung cancer. Multiple liver lesions on previous imaging. EXAM: MRI ABDOMEN WITHOUT AND WITH CONTRAST TECHNIQUE: Multiplanar multisequence MR imaging of the abdomen was performed both before and after the administration of intravenous contrast. CONTRAST:  22mL GADAVIST GADOBUTROL 1 MMOL/ML IV SOLN COMPARISON:  04/01/2020 FINDINGS: Lower chest: Unremarkable. Hepatobiliary: As before, several hypervascular lesions are seen in the liver parenchyma on arterial phase imaging. Dominant index lesions are visible on 23 second postcontrast image 19 of series 14. More anterior of the 2 lesions measures 14 mm today, stable from 14 mm previously. The more posterior lesion best seen on image 21/4 is 11 mm today compared to 12 mm previously. The remaining lesions are also unchanged. None of these show diffusion restriction and are isointense to background liver parenchyma on precontrast and delayed postcontrast imaging. There is no evidence for gallstones, gallbladder wall thickening, or pericholecystic fluid. No intrahepatic or extrahepatic biliary dilation. Pancreas: No focal mass lesion. No dilatation of the main duct. No  intraparenchymal cyst. No peripancreatic edema. Spleen:  No splenomegaly. No focal mass lesion. Adrenals/Urinary Tract: No adrenal nodule or mass. Kidneys unremarkable. Stomach/Bowel: Stomach is unremarkable. No gastric wall thickening. No evidence of outlet obstruction. Duodenum is normally positioned as is the ligament of Treitz. No small bowel or colonic dilatation within the visualized abdomen. Vascular/Lymphatic: No abdominal aortic aneurysm. No abdominal lymphadenopathy. Other:  No intraperitoneal free fluid. Musculoskeletal: No focal suspicious marrow enhancement within the visualized bony anatomy. Cavernous hemangioma noted L2 vertebral body. IMPRESSION: Stable hypervascular lesions in the liver parenchyma without new or progressive findings. These lesions are also stable comparing back to chest CT of 03/11/2020 and the more posterior of the 2 index lesions is stable comparing to a CT chest of 08/14/2019. Interval stability is reassuring and lesions remain most suggestive of benign etiology, potentially focal nodular hyperplasia. Follow-up MRI in 6-12 months could be used to ensure continued stability as clinically warranted. The Electronically Signed   By: Misty Stanley M.D.   On: 07/11/2020 11:39    ASSESSMENT & PLAN:  1. Primary lung adenocarcinoma, right ([place])   2. Port-A-Cath in place   3. Hypokalemia   4. Liver lesion    Stage IIIA T3 N1 M0 lung adenocarcinoma, s/p concurrent chemoradiation.  Finished 1 year of durvalumab treatments. Labs are reviewed and discussed with patient. I will obtain CT chest without contrast for follow-up of his lung cancer.  Multiple  liver nodules,  07/11/2020 MRI liver with and without contrast showed stable hypervascular lesions in the liver parenchyma without new or progressive findings.  Lesions remain most suggestive of benign etiology.  Recommend repeat MRI in 6 to 12 months.  Chronic hypokalemia, potassium is stable at 3.4.  Continue oral potassium  chloride 10 mEq daily. Port-A-Cath in place, continue port flush every 6 to 8 weeks.  We spent sufficient time to discuss many aspect of care, questions were answered to patient's satisfaction.  Return visit, lab MD 4- 5 months. We spent sufficient time to discuss many aspect of care, questions were answered to patient's satisfaction.   [name], MD, PhD

## 2020-08-09 ENCOUNTER — Other Ambulatory Visit: Payer: Self-pay

## 2020-08-09 ENCOUNTER — Ambulatory Visit
Admission: RE | Admit: 2020-08-09 | Discharge: 2020-08-09 | Disposition: A | Discharge status: Home / Self Care | Payer: Medicare Other | Source: Ambulatory Visit | Attending: Oncology | Admitting: Oncology

## 2020-08-09 DIAGNOSIS — C3491 Malignant neoplasm of unspecified part of right bronchus or lung: Secondary | ICD-10-CM | POA: Insufficient documentation

## 2020-08-23 ENCOUNTER — Telehealth: Payer: Self-pay

## 2020-08-23 DIAGNOSIS — C3491 Malignant neoplasm of unspecified part of right bronchus or lung: Secondary | ICD-10-CM

## 2020-08-23 NOTE — Telephone Encounter (Signed)
Patient notified of results.  Please schedule CT and inform patient of appt details.

## 2020-08-23 NOTE — Telephone Encounter (Signed)
Done Pt CT on 11/16/20 @ 10am Pt was aware

## 2020-08-23 NOTE — Telephone Encounter (Signed)
-----   Message from [name], MD sent at 08/22/2020  9:50 PM EST ----- Please let him know that no new recurrence. There is a spot in his left lung that appears larger. Recommend repeat CT in 3 months. Please arrange him to repeat CT chest wo contrast in 3 months. Thanks.

## 2020-08-30 ENCOUNTER — Inpatient Hospital Stay: Payer: Medicare Other | Attending: Oncology

## 2020-08-30 DIAGNOSIS — Z95828 Presence of other vascular implants and grafts: Secondary | ICD-10-CM

## 2020-08-30 DIAGNOSIS — Z452 Encounter for adjustment and management of vascular access device: Secondary | ICD-10-CM | POA: Diagnosis present

## 2020-08-30 DIAGNOSIS — C3411 Malignant neoplasm of upper lobe, right bronchus or lung: Secondary | ICD-10-CM | POA: Insufficient documentation

## 2020-08-30 MED ORDER — HEPARIN SOD (PORK) LOCK FLUSH 100 UNIT/ML IV SOLN
500.0000 [IU] | Freq: Once | INTRAVENOUS | Status: AC
  Administered 2020-08-30: 500 [IU] via INTRAVENOUS
  Filled 2020-08-30: qty 5

## 2020-08-30 MED ORDER — SODIUM CHLORIDE 0.9% FLUSH
10.0000 mL | Freq: Once | INTRAVENOUS | Status: AC
  Administered 2020-08-30: 10 mL via INTRAVENOUS
  Filled 2020-08-30: qty 10

## 2020-09-13 IMAGING — US UPPER LEFT VENOUS EXTREMITY ULTRASOUND
1 series · 13 of 24 positions shown · non-contrast
Comparison: None.

CLINICAL DATA: 68-year-old male with a history of left arm pain



[Series 1: upper left venous extremity ultrasound · 0.08mm/px · 13 of 34 slices shown]
[im 1/34]
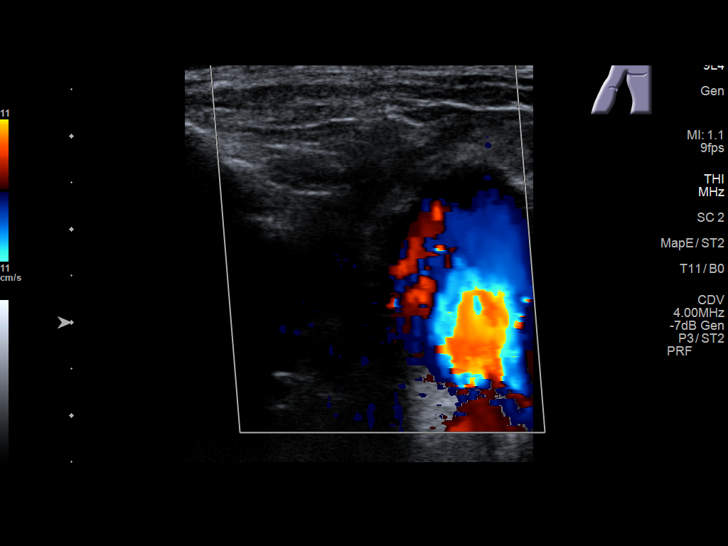
[im 3/34]
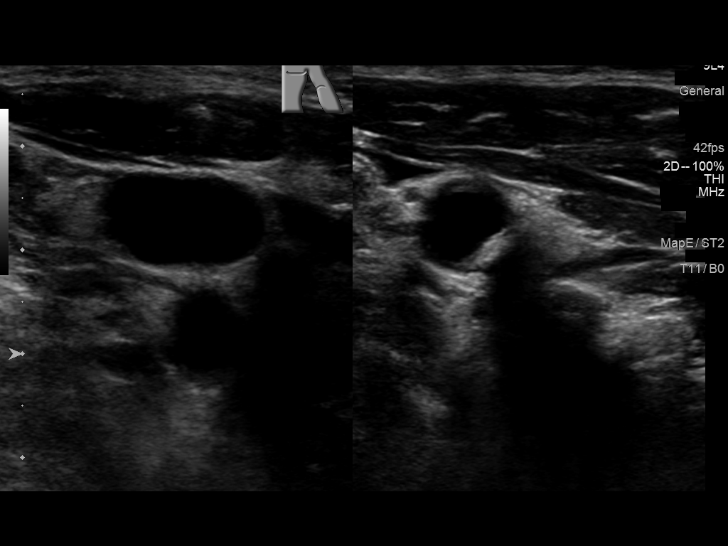
[im 6/34]
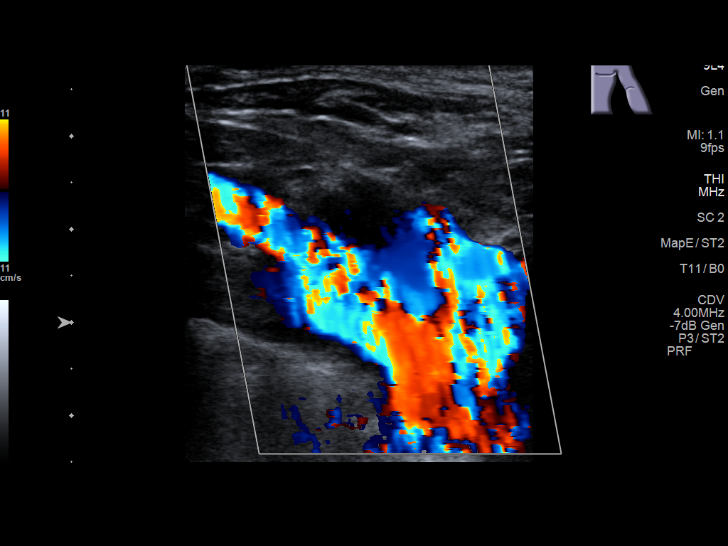
[im 9/34]
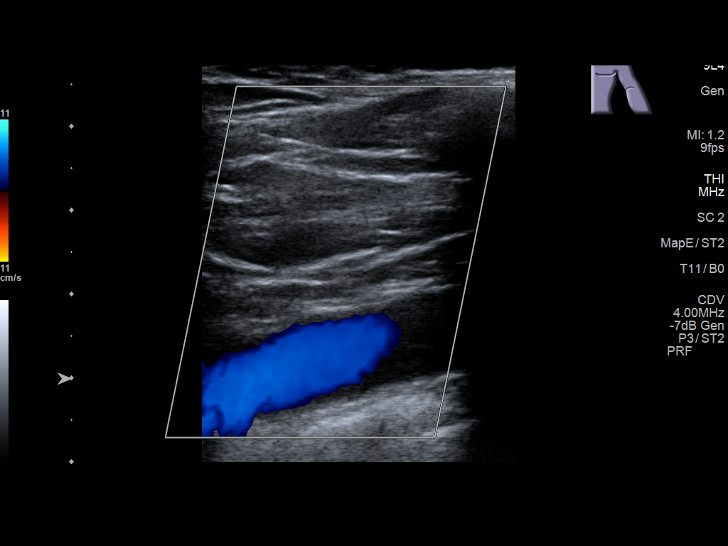
[im 12/34]
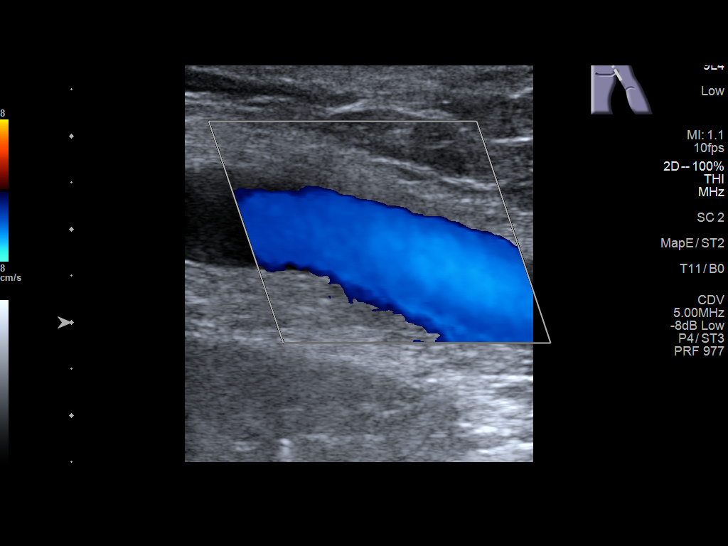
[im 15/34]
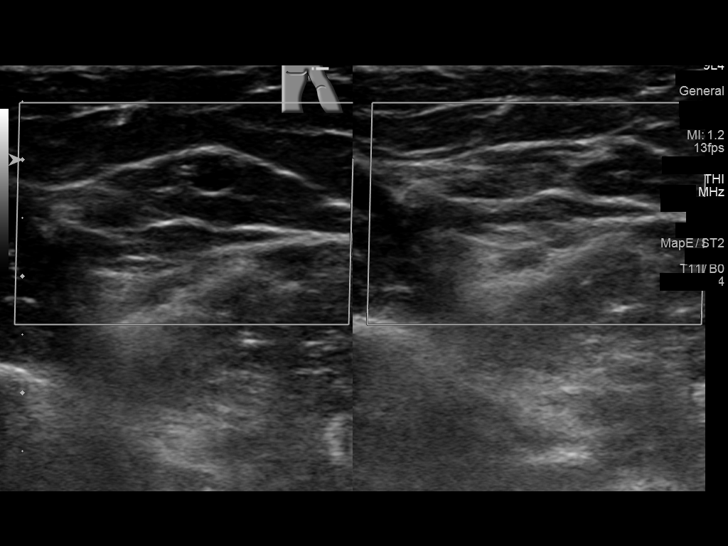
[im 18/34]
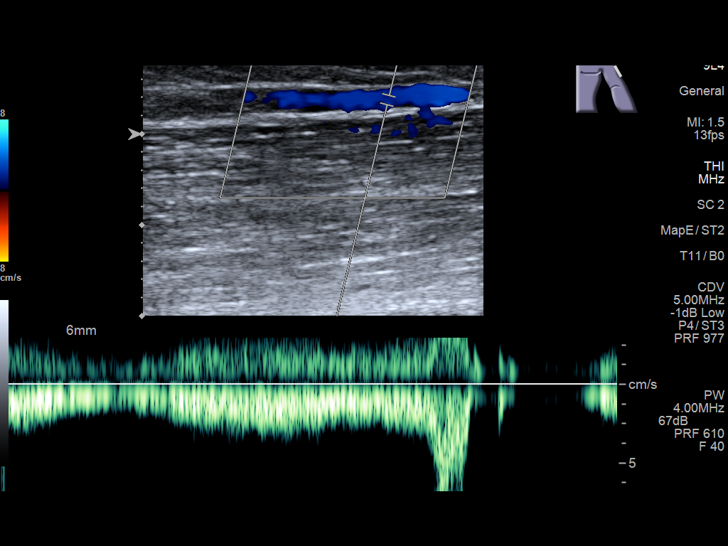
[im 19/34]
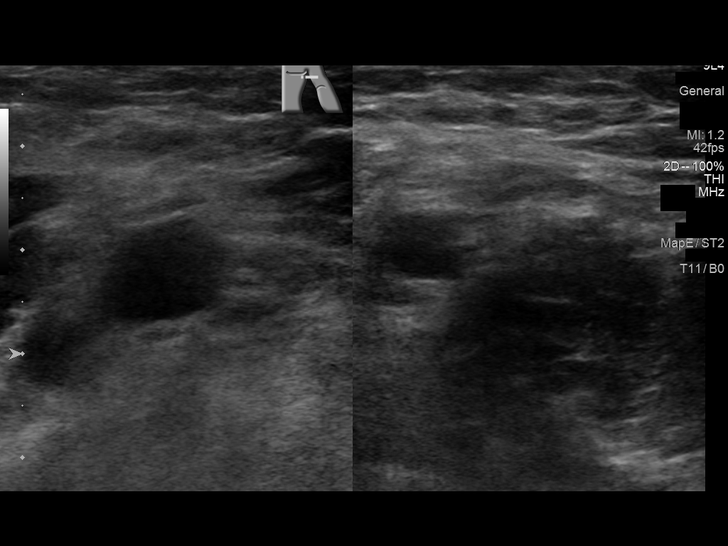
[im 22/34]
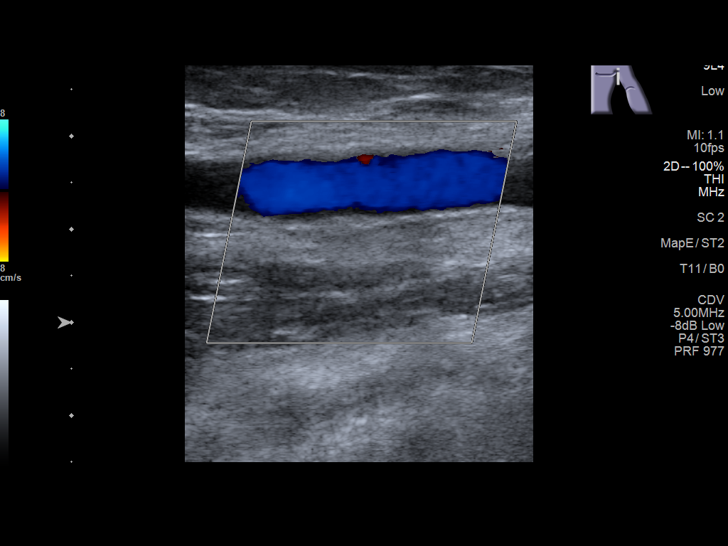
[im 25/34]
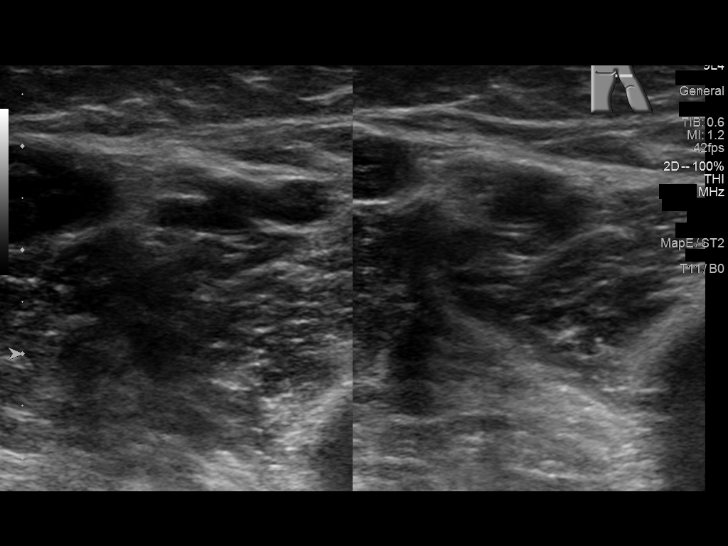
[im 28/34]
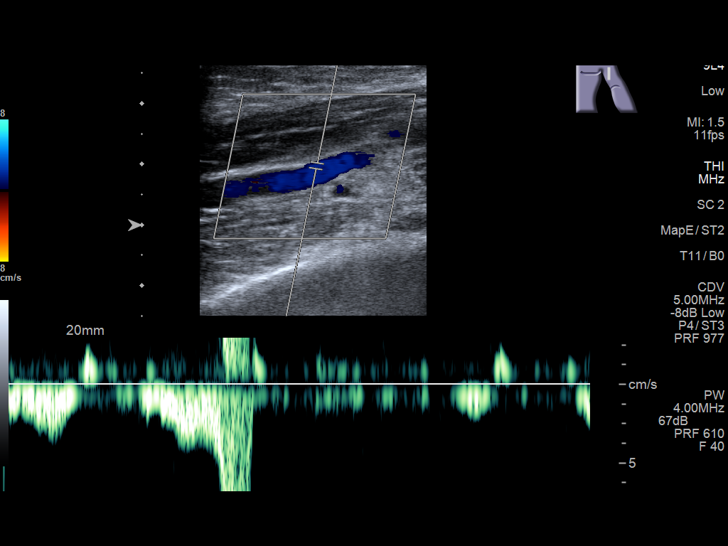
[im 31/34]
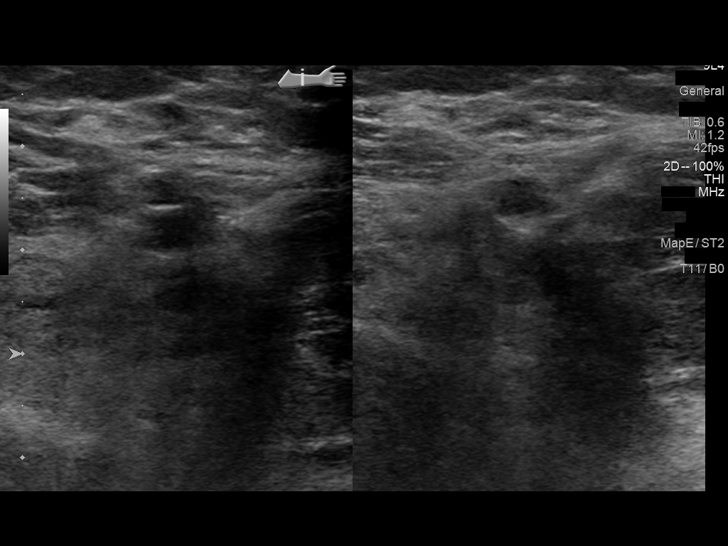
[im 34/34]
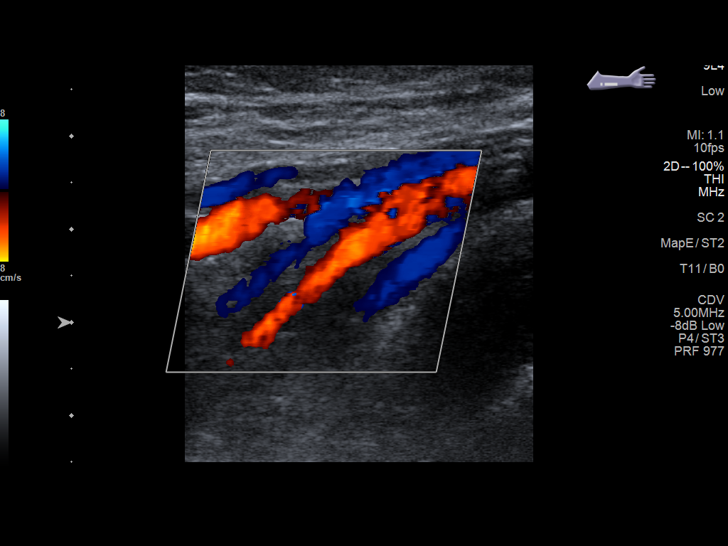

[13 of 24 positions shown; findings below may reference images not displayed]

FINDINGS: Contralateral Subclavian Vein: Respiratory phasicity is normal and
symmetric with the symptomatic side. No evidence of thrombus. Normal
compressibility.

Internal Jugular Vein: No evidence of thrombus. Normal
compressibility, respiratory phasicity and response to augmentation.

Subclavian Vein: No evidence of thrombus. Normal compressibility,
respiratory phasicity and response to augmentation.

Axillary Vein: No evidence of thrombus. Normal compressibility,
respiratory phasicity and response to augmentation.

Cephalic Vein: No evidence of thrombus. Normal compressibility,
respiratory phasicity and response to augmentation.

Basilic Vein: No evidence of thrombus. Normal compressibility,
respiratory phasicity and response to augmentation.

Brachial Veins: No evidence of thrombus. Normal compressibility,
respiratory phasicity and response to augmentation.

Radial Veins: No evidence of thrombus. Normal compressibility,
respiratory phasicity and response to augmentation.

Ulnar Veins: No evidence of thrombus. Normal compressibility,
respiratory phasicity and response to augmentation.

Other Findings:  None visualized.
IMPRESSION: Sonographic survey of the left upper extremity negative for DVT

## 2020-10-25 ENCOUNTER — Inpatient Hospital Stay: Payer: Medicare Other | Attending: Oncology

## 2020-10-25 ENCOUNTER — Other Ambulatory Visit: Payer: Self-pay

## 2020-10-25 DIAGNOSIS — Z452 Encounter for adjustment and management of vascular access device: Secondary | ICD-10-CM | POA: Diagnosis not present

## 2020-10-25 DIAGNOSIS — Z95828 Presence of other vascular implants and grafts: Secondary | ICD-10-CM

## 2020-10-25 DIAGNOSIS — C3411 Malignant neoplasm of upper lobe, right bronchus or lung: Secondary | ICD-10-CM | POA: Insufficient documentation

## 2020-10-25 MED ORDER — HEPARIN SOD (PORK) LOCK FLUSH 100 UNIT/ML IV SOLN
500.0000 [IU] | Freq: Once | INTRAVENOUS | Status: AC
  Administered 2020-10-25: 500 [IU] via INTRAVENOUS
  Filled 2020-10-25: qty 5

## 2020-10-25 MED ORDER — SODIUM CHLORIDE 0.9% FLUSH
10.0000 mL | Freq: Once | INTRAVENOUS | Status: AC
  Administered 2020-10-25: 10 mL via INTRAVENOUS
  Filled 2020-10-25: qty 10

## 2020-11-16 ENCOUNTER — Other Ambulatory Visit: Payer: Self-pay

## 2020-11-16 ENCOUNTER — Ambulatory Visit
Admission: RE | Admit: 2020-11-16 | Discharge: 2020-11-16 | Disposition: A | Discharge status: Home / Self Care | Payer: Medicare Other | Source: Ambulatory Visit | Attending: Oncology | Admitting: Oncology

## 2020-11-16 DIAGNOSIS — C3491 Malignant neoplasm of unspecified part of right bronchus or lung: Secondary | ICD-10-CM | POA: Diagnosis not present

## 2020-11-22 ENCOUNTER — Telehealth: Payer: Self-pay

## 2020-11-22 DIAGNOSIS — C3491 Malignant neoplasm of unspecified part of right bronchus or lung: Secondary | ICD-10-CM

## 2020-11-22 NOTE — Telephone Encounter (Signed)
-----   Message from [name], MD sent at 11/21/2020 11:52 PM EDT ----- Please let patient know that he has a left upper lobe nodule that is getting bigger. Please arrange him to get PET scan, restaging. History of lung cancer, enlarging lung nodule. Thanks.

## 2020-11-22 NOTE — Telephone Encounter (Addendum)
Pt informed of MD plan to obtain PET for further evaluation of lung nodule. Will send message to scheduling pool.

## 2020-11-24 ENCOUNTER — Telehealth: Payer: Self-pay | Admitting: Oncology

## 2020-11-24 ENCOUNTER — Encounter: Payer: Self-pay | Admitting: Oncology

## 2020-11-24 NOTE — Telephone Encounter (Signed)
Please cancel port flush on 6/21 and and adjust lab on 6/23 to PF w/ lab, so pt doesn't make double trip on the same week. Please notify pt of changes so he doesnt come on 6/21. thanks

## 2020-11-24 NOTE — Telephone Encounter (Signed)
Called pt to review appointment changes: Cancelled appt on 0/10 (per RN/duplicate portflush/lab) Patient was scheduled to see Dr. Tasia Catchings on Thursday 6/23 in [place]. I noticed he lives in [place] so offered that location to him (in order to keep the appt the same) if it was more convenient. Patient was agreeable. Sending appt reminder in the mail.

## 2020-12-01 ENCOUNTER — Other Ambulatory Visit: Payer: Self-pay

## 2020-12-01 ENCOUNTER — Ambulatory Visit: Admission: RE | Admit: 2020-12-01 | Payer: Medicare Other | Source: Ambulatory Visit

## 2020-12-01 ENCOUNTER — Ambulatory Visit
Admission: RE | Admit: 2020-12-01 | Discharge: 2020-12-01 | Disposition: A | Discharge status: Home / Self Care | Payer: Medicare Other | Source: Ambulatory Visit | Attending: Oncology | Admitting: Oncology

## 2020-12-01 DIAGNOSIS — K5730 Diverticulosis of large intestine without perforation or abscess without bleeding: Secondary | ICD-10-CM | POA: Insufficient documentation

## 2020-12-01 DIAGNOSIS — J439 Emphysema, unspecified: Secondary | ICD-10-CM | POA: Diagnosis not present

## 2020-12-01 DIAGNOSIS — J320 Chronic maxillary sinusitis: Secondary | ICD-10-CM | POA: Insufficient documentation

## 2020-12-01 DIAGNOSIS — C3491 Malignant neoplasm of unspecified part of right bronchus or lung: Secondary | ICD-10-CM | POA: Diagnosis present

## 2020-12-01 DIAGNOSIS — I700 Atherosclerosis of aorta: Secondary | ICD-10-CM | POA: Diagnosis not present

## 2020-12-01 LAB — GLUCOSE, CAPILLARY: Glucose-Capillary: 114 mg/dL — ABNORMAL HIGH (ref 70–99)

## 2020-12-01 MED ORDER — FLUDEOXYGLUCOSE F - 18 (FDG) INJECTION
11.7000 | Freq: Once | INTRAVENOUS | Status: AC | PRN
  Administered 2020-12-01: 11.83 via INTRAVENOUS

## 2020-12-20 ENCOUNTER — Inpatient Hospital Stay: Payer: Medicare Other

## 2020-12-22 ENCOUNTER — Inpatient Hospital Stay (HOSPITAL_BASED_OUTPATIENT_CLINIC_OR_DEPARTMENT_OTHER): Payer: Medicare Other | Admitting: Oncology

## 2020-12-22 ENCOUNTER — Inpatient Hospital Stay: Payer: Medicare Other | Attending: Oncology

## 2020-12-22 ENCOUNTER — Encounter: Payer: Self-pay | Admitting: Oncology

## 2020-12-22 ENCOUNTER — Other Ambulatory Visit: Payer: Self-pay

## 2020-12-22 ENCOUNTER — Other Ambulatory Visit: Payer: Medicare Other

## 2020-12-22 ENCOUNTER — Ambulatory Visit: Payer: Medicare Other | Admitting: Oncology

## 2020-12-22 VITALS — BP 132/74 | HR 73 | Temp 96.9°F | Resp 20 | Wt 225.5 lb

## 2020-12-22 DIAGNOSIS — Z9221 Personal history of antineoplastic chemotherapy: Secondary | ICD-10-CM | POA: Insufficient documentation

## 2020-12-22 DIAGNOSIS — D376 Neoplasm of uncertain behavior of liver, gallbladder and bile ducts: Secondary | ICD-10-CM | POA: Insufficient documentation

## 2020-12-22 DIAGNOSIS — Z95828 Presence of other vascular implants and grafts: Secondary | ICD-10-CM | POA: Diagnosis not present

## 2020-12-22 DIAGNOSIS — E876 Hypokalemia: Secondary | ICD-10-CM

## 2020-12-22 DIAGNOSIS — K7689 Other specified diseases of liver: Secondary | ICD-10-CM | POA: Insufficient documentation

## 2020-12-22 DIAGNOSIS — Z125 Encounter for screening for malignant neoplasm of prostate: Secondary | ICD-10-CM

## 2020-12-22 DIAGNOSIS — R351 Nocturia: Secondary | ICD-10-CM

## 2020-12-22 DIAGNOSIS — Z7982 Long term (current) use of aspirin: Secondary | ICD-10-CM | POA: Insufficient documentation

## 2020-12-22 DIAGNOSIS — I119 Hypertensive heart disease without heart failure: Secondary | ICD-10-CM | POA: Insufficient documentation

## 2020-12-22 DIAGNOSIS — K769 Liver disease, unspecified: Secondary | ICD-10-CM | POA: Diagnosis not present

## 2020-12-22 DIAGNOSIS — Z87891 Personal history of nicotine dependence: Secondary | ICD-10-CM | POA: Insufficient documentation

## 2020-12-22 DIAGNOSIS — C3491 Malignant neoplasm of unspecified part of right bronchus or lung: Secondary | ICD-10-CM

## 2020-12-22 DIAGNOSIS — Z79899 Other long term (current) drug therapy: Secondary | ICD-10-CM | POA: Insufficient documentation

## 2020-12-22 DIAGNOSIS — N400 Enlarged prostate without lower urinary tract symptoms: Secondary | ICD-10-CM | POA: Insufficient documentation

## 2020-12-22 DIAGNOSIS — Z8616 Personal history of COVID-19: Secondary | ICD-10-CM | POA: Insufficient documentation

## 2020-12-22 DIAGNOSIS — Z923 Personal history of irradiation: Secondary | ICD-10-CM | POA: Diagnosis not present

## 2020-12-22 DIAGNOSIS — E785 Hyperlipidemia, unspecified: Secondary | ICD-10-CM | POA: Diagnosis not present

## 2020-12-22 LAB — COMPREHENSIVE METABOLIC PANEL
ALT: 19 U/L (ref 0–44)
AST: 21 U/L (ref 15–41)
Albumin: 4.1 g/dL (ref 3.5–5.0)
Alkaline Phosphatase: 55 U/L (ref 38–126)
Anion gap: 7 (ref 5–15)
BUN: 14 mg/dL (ref 8–23)
CO2: 29 mmol/L (ref 22–32)
Calcium: 9.1 mg/dL (ref 8.9–10.3)
Chloride: 103 mmol/L (ref 98–111)
Creatinine, Ser: 0.81 mg/dL (ref 0.61–1.24)
GFR, Estimated: >60 mL/min (ref >60)
Glucose, Bld: 114 mg/dL — ABNORMAL HIGH (ref 70–99)
Potassium: 3.5 mmol/L (ref 3.5–5.1)
Sodium: 139 mmol/L (ref 135–145)
Total Bilirubin: 0.5 mg/dL (ref 0.3–1.2)
Total Protein: 7.6 g/dL (ref 6.5–8.1)

## 2020-12-22 LAB — CBC WITH DIFFERENTIAL/PLATELET
Abs Immature Granulocytes: 0.04 10*3/uL (ref 0.00–0.07)
Basophils Absolute: 0 10*3/uL (ref 0.0–0.1)
Basophils Relative: 0 %
Eosinophils Absolute: 0.1 10*3/uL (ref 0.0–0.5)
Eosinophils Relative: 1 %
HCT: 41.6 % (ref 39.0–52.0)
Hemoglobin: 14.5 g/dL (ref 13.0–17.0)
Immature Granulocytes: 0 %
Lymphocytes Relative: 10 %
Lymphs Abs: 1 10*3/uL (ref 0.7–4.0)
MCH: 29.1 pg (ref 26.0–34.0)
MCHC: 34.9 g/dL (ref 30.0–36.0)
MCV: 83.4 fL (ref 80.0–100.0)
Monocytes Absolute: 1 10*3/uL (ref 0.1–1.0)
Monocytes Relative: 9 %
Neutro Abs: 8.1 10*3/uL — ABNORMAL HIGH (ref 1.7–7.7)
Neutrophils Relative %: 80 %
Platelets: 243 10*3/uL (ref 150–400)
RBC: 4.99 MIL/uL (ref 4.22–5.81)
RDW: 14.1 % (ref 11.5–15.5)
WBC: 10.3 10*3/uL (ref 4.0–10.5)
nRBC: 0 % (ref 0.0–0.2)

## 2020-12-22 LAB — PSA: Prostatic Specific Antigen: 3.71 ng/mL (ref 0.00–4.00)

## 2020-12-22 MED ORDER — HEPARIN SOD (PORK) LOCK FLUSH 100 UNIT/ML IV SOLN
500.0000 [IU] | Freq: Once | INTRAVENOUS | Status: AC
  Administered 2020-12-22: 500 [IU] via INTRAVENOUS
  Filled 2020-12-22: qty 5

## 2020-12-22 MED ORDER — SODIUM CHLORIDE 0.9% FLUSH
10.0000 mL | INTRAVENOUS | Status: DC | PRN
  Administered 2020-12-22: 10 mL via INTRAVENOUS
  Filled 2020-12-22: qty 10

## 2020-12-22 NOTE — Progress Notes (Addendum)
Hematology/Oncology Follow up note [facility] [phone] [phone]   Patient Care Team: [name], MD as PCP - General (Internal Medicine) [name], RN as Registered Nurse [name], MD as Consulting Physician (Oncology) [name], MD as Referring Physician (Radiation Oncology) [name], MD as Referring Physician (Vascular Surgery)   REASON FOR VISIT Follow up for immunotherapy treatment for stage IIIA lung adenocarcinoma  Oncology History:  Mark Mccarty is a  70 y.o.  male with  adenocarcinoma of lung, diagnosed via biopsy of right hilar lymph nodes.  Former 43 pack year smoking history.  Presents for assessment prior to concurrent chemo T3 N1 M0 August 2019 Finished  concurrent chemoradiation.   03/17/2019 Finished 1 year of durvalumab treatment.  INTERVAL HISTORY Mark Mccarty is a 70 y.o. male who has above history reviewed by me presents for follow-up of stage III A non-small cell lung cancer. Patient has no new complaints.  Denies any cough unintentional weight loss, shortness of breath.  Patient had surveillance CT scan done followed by PET scan.  Present to discuss results.  . Review of Systems  Constitutional:  Negative for appetite change, chills, fatigue, fever and unexpected weight change.  [name]:   Negative for hearing loss and voice change.   Eyes:  Negative for eye problems and icterus.  Respiratory:  Negative for chest tightness, cough and shortness of breath.   Cardiovascular:  Negative for chest pain and leg swelling.  Gastrointestinal:  Negative for abdominal distention and abdominal pain.  Endocrine: Negative for hot flashes.  Genitourinary:  Negative for difficulty urinating, dysuria and frequency.   Musculoskeletal:  Negative for arthralgias.  Skin:  Negative for itching and rash.  Neurological:  Negative for headaches, light-headedness and numbness.  [name]:  Negative for  adenopathy. Does not bruise/bleed easily.  Psychiatric/Behavioral:  Negative for confusion.     MEDICAL HISTORY:  Past Medical History:  Diagnosis Date   Aortic insufficiency    Asthma    BPH (benign prostatic hyperplasia)    Cancer (HCC)    lung   Cataract    left cataract surgery   Centrilobular emphysema (HCC)    Centrilobular emphysema (HCC)    Colon adenomas    Diverticulosis    Duodenitis    Dysphagia    Dyspnea    [name]    ED (erectile dysfunction)    Gastritis    GERD (gastroesophageal reflux disease)    [name]    Hyperlipidemia    Hypertension    Hyperuricemia    Left ventricular diastolic dysfunction    Neuromuscular disorder (HCC)    pt has knees bilateral with tendon issues that cause pain   [name] (peptic ulcer disease)    [name]     SURGICAL HISTORY: Past Surgical History:  Procedure Laterality Date   CARDIAC CATHETERIZATION N/A 12/16/2014   Procedure: Left Heart Cath;  Surgeon: [name], MD;  Location: [place] [facility];  Service: Cardiovascular;  Laterality: N/A;   COLONOSCOPY     COLONOSCOPY WITH PROPOFOL N/A 01/14/2020   Procedure: COLONOSCOPY WITH PROPOFOL;  Surgeon: Toledo, Benay Pike, MD;  Location: [facility];  Service: Gastroenterology;  Laterality: N/A;   CYST REMOVAL TRUNK     chest and back over time and it was removed   CYSTECTOMY     ENDOBRONCHIAL ULTRASOUND N/A 12/09/2017   Procedure: ENDOBRONCHIAL ULTRASOUND;  Surgeon: [name], MD;  Location: [name];  Service: Pulmonary;  Laterality:  N/A;   EYE SURGERY     left cataract surgery     PORTA CATH INSERTION N/A 12/23/2017   Procedure: PORTA CATH INSERTION;  Surgeon: [name], MD;  Location: [place] [facility];  Service: Cardiovascular;  Laterality: N/A;   UPPER GI ENDOSCOPY      SOCIAL HISTORY: Social History   Socioeconomic History   Marital status: Divorced    Spouse name: Not on file   Number of  children: Not on file   Years of education: Not on file   Highest education level: Not on file  Occupational History   Not on file  Tobacco Use   Smoking status: Former    Packs/day: 1.50    Years: 35.00    Pack years: 52.50    Types: Cigarettes    Quit date: 06/22/2015    Years since quitting: 5.5   Smokeless tobacco: Never  Vaping Use   Vaping Use: Never used  Substance and Sexual Activity   Alcohol use: Yes    Comment: weekends   Drug use: No   Sexual activity: Not Currently  Other Topics Concern   Not on file  Social History Narrative   Not on file   Social Determinants of Health   Financial Resource Strain: Not on file  Food Insecurity: Not on file  Transportation Needs: Not on file  Physical Activity: Not on file  Stress: Not on file  Social Connections: Not on file  Intimate Partner Violence: Not on file    FAMILY HISTORY: Family History  Problem Relation Age of Onset   Hypertension Mother    Breast cancer Mother    Heart attack Mother    Lung cancer Father    Heart disease Sister    Diabetes Sister    Colon cancer Sister     ALLERGIES:  is allergic to penicillins.  MEDICATIONS:  Current Outpatient Medications  Medication Sig Dispense Refill   albuterol (PROVENTIL HFA;VENTOLIN HFA) 108 (90 Base) MCG/ACT inhaler Inhale 2 puffs into the lungs every 6 (six) hours as needed.   12   amLODipine (NORVASC) 10 MG tablet Take 10 mg by mouth daily.     ascorbic acid (VITAMIN C) 1000 MG tablet Take by mouth.     aspirin 81 MG tablet Take 81 mg by mouth daily.     carvedilol (COREG) 25 MG tablet Take 25 mg by mouth 2 (two) times daily with a meal.     Cholecalciferol 50 MCG (2000 UT) TABS Take by mouth.     gabapentin (NEURONTIN) 300 MG capsule Take 300 mg by mouth at bedtime as needed.      HYDROcodone-acetaminophen (NORCO/VICODIN) 5-325 MG tablet Take one tablet at night for pain; may take up to every 8 hours as needed for pain if not working or driving      ibuprofen (ADVIL,MOTRIN) 600 MG tablet Take 600 mg by mouth every 6 (six) hours as needed.     ID NOW COVID-19 KIT TEST AS DIRECTED TODAY     lidocaine-prilocaine (EMLA) cream Apply to port area once 30 g 3   lisinopril (PRINIVIL,ZESTRIL) 40 MG tablet Take 40 mg by mouth daily.     potassium chloride (KLOR-CON) 10 MEQ tablet Take 1 tablet (10 mEq total) by mouth daily. 90 tablet 1   pravastatin (PRAVACHOL) 20 MG tablet Take 20 mg by mouth at bedtime.     senna (SENOKOT) 8.6 MG TABS tablet Take 2 tablets (17.2 mg total) by mouth daily.  120 each 0   acetaminophen (TYLENOL) 500 MG tablet Take 500 mg by mouth as needed. (Patient not taking: Reported on 12/22/2020)     prochlorperazine (COMPAZINE) 10 MG tablet Take 1 tablet (10 mg total) by mouth every 6 (six) hours as needed (Nausea or vomiting). (Patient not taking: Reported on 12/22/2020) 30 tablet 1   tamsulosin (FLOMAX) 0.4 MG CAPS capsule Take 0.4 mg by mouth daily. (Patient not taking: Reported on 12/22/2020)     zolpidem (AMBIEN) 5 MG tablet Take 1 tablet by mouth.  (Patient not taking: No sig reported)     No current facility-administered medications for this visit.   Facility-Administered Medications Ordered in Other Visits  Medication Dose Route Frequency Provider Last Rate Last Admin   sodium chloride flush (NS) 0.9 % injection 10 mL  10 mL Intravenous PRN [name], MD   10 mL at 03/18/18 0858     PHYSICAL EXAMINATION: ECOG PERFORMANCE STATUS: 0 - Asymptomatic Vitals:   12/22/20 1006  BP: 132/74  Pulse: 73  Resp: 20  Temp: (!) 96.9 F (36.1 C)  SpO2: 97%   Filed Weights   12/22/20 1006  Weight: 225 lb 8.5 oz (102.3 kg)    Physical Exam Constitutional:      General: He is not in acute distress. [name]:     Head: Normocephalic and atraumatic.  Eyes:     General: No scleral icterus.    Conjunctiva/sclera: Conjunctivae normal.     Pupils: Pupils are equal, round, and reactive to light.  Cardiovascular:     Rate and Rhythm:  Normal rate and regular rhythm.     Heart sounds: Normal heart sounds.  Pulmonary:     Effort: Pulmonary effort is normal. No respiratory distress.     Breath sounds: No wheezing or rales.     Comments: Decreased breath sound bilaterally Chest:     Chest wall: No tenderness.  Abdominal:     General: Bowel sounds are normal. There is no distension.     Palpations: Abdomen is soft. There is no mass.     Tenderness: There is no abdominal tenderness.  Musculoskeletal:        General: No deformity. Normal range of motion.     Cervical back: Normal range of motion and neck supple.  Lymphadenopathy:     Cervical: No cervical adenopathy.  Skin:    General: Skin is warm and dry.     Findings: No erythema or rash.     Comments: Chest wall Mediport site no erythema or discharge. Left supraclavicular fossa fullness, chronic.  Neurological:     Mental Status: He is alert and oriented to person, place, and time. Mental status is at baseline.     Cranial Nerves: No cranial nerve deficit.     Coordination: Coordination normal.  Psychiatric:        Mood and Affect: Mood normal.     LABORATORY DATA:  I have reviewed the data as listed Lab Results  Component Value Date   WBC 10.3 12/22/2020   HGB 14.5 12/22/2020   HCT 41.6 12/22/2020   MCV 83.4 12/22/2020   PLT 243 12/22/2020   Recent Labs    03/11/20 0857 07/25/20 0842 12/22/20 0938  NA 140 136 139  K 3.4* 3.4* 3.5  CL 101 99 103  CO2 '27 29 29  ' GLUCOSE 120* 114* 114*  BUN '11 13 14  ' CREATININE 0.80 0.83 0.81  CALCIUM 8.8* 8.8* 9.1  GFRNONAA >60 >60 >60  GFRAA >60  --   --   PROT 7.7 7.6 7.6  ALBUMIN 4.1 4.2 4.1  AST '22 22 21  ' ALT '18 19 19  ' ALKPHOS 58 55 55  BILITOT 0.6 0.8 0.5    RADIOGRAPHIC STUDIES: I have personally reviewed the radiological images as listed and agreed with the findings in the report..  03/21/2018 CT chest showed mild decreasing size of the right upper lobe pulmonary nodule.  Interval decrease in  size of right paratracheal and right hilar adenopathy.  06/30/2018 CT chest showed interval development of extensive changes secondary to external beam radiation within the posterior right upper lobe and superior segment of right lower lobe.  The enlarged right paratracheal lymph node is decreased in size in the interval.  Right upper lobe lung lesion is difficult to separate from surrounding changes due to radiation but is favored to have decrease in size in the interval.  Aortic atherosclerosis and emphysema.  LAD coronary artery atherosclerotic calcification.  08/06/2018 CT head without contrast Brain parenchymal appears unremarkable there is no mass or hemorrhage.    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 Continued further decrease in the size of previously identified right.  Tracheal lymph node. Aortic atherosclerosis.  Emphysema.  CT CHEST WO CONTRAST  Result Date: 11/17/2020 CLINICAL DATA:  Stage IIIA adenocarcinoma diagnosed via bronchoscopy of right hilar lymph nodes. Status post immunotherapy. COVID 19 in January. Ex-smoker. EXAM: CT CHEST WITHOUT CONTRAST TECHNIQUE: Multidetector CT imaging of the chest was performed following the standard protocol without IV contrast. COMPARISON:  08/10/2020.  MRI of 07/11/2020 also reviewed. FINDINGS: Cardiovascular: Left Port-A-Cath tip at low SVC. Aortic atherosclerosis. Normal heart size, without pericardial effusion. Left main and LAD coronary artery calcification. Mediastinum/Nodes: No supraclavicular adenopathy. Left middle mediastinal node of 8 mm on 59/2 is similar to 7 mm on the prior. Node within the azygoesophageal recess measures 11 mm on 75/2 versus 9 mm on the prior. Soft tissue fullness within the right hilum including on 66/2 is similar to on the prior exam and with secondary to prominent hilar vasculature on the contrast enhanced  exam of 03/11/2020. Prevascular nodes including at up to 9 mm on 59/2, similar. Lungs/Pleura: Right-sided pleural thickening. Moderate bullous emphysema. Left apical pulmonary nodule measures on the order of 10 x 12 mm on 27/3 versus 8 x 8 mm on the prior exam. Geographic right paramediastinal and right hilar consolidation with traction bronchiectasis and architectural distortion, similar. No residual or recurrent pulmonary parenchymal lesion. Upper Abdomen: Normal imaged portions of the liver, spleen, stomach, pancreas, gallbladder, left adrenal gland, kidneys. Minimal right adrenal nodularity is low-density, likely due to a subcentimeter adenoma. Similar. Large colonic stool burden. Musculoskeletal: Presumed sebaceous cyst about the high right anterior chest wall at 1.2 cm. No acute osseous abnormality. IMPRESSION: 1. Further enlargement of a left upper lobe/apical pulmonary nodule, which could represent metachronous primary or isolated pulmonary metastasis. 2. Similar right paramediastinal and perihilar radiation fibrosis, without locally recurrent disease. 3. Similar versus minimal enlargement of nonspecific borderline to mildly enlarged thoracic nodes. These warrants follow-up attention. 4. Aortic atherosclerosis (ICD10-I70.0), coronary artery atherosclerosis and emphysema (ICD10-J43.9). Electronically Signed   By: Abigail Miyamoto M.D.   On: 11/17/2020 13:53   NM PET Image Restag (PS) Skull Base To Thigh  Result Date: 12/01/2020 CLINICAL DATA:  Subsequent treatment strategy for lung cancer with enlarging lung nodule. EXAM: NUCLEAR MEDICINE PET SKULL  BASE TO THIGH TECHNIQUE: 11.8 mCi F-18 FDG was injected intravenously. Full-ring PET imaging was performed from the skull base to thigh after the radiotracer. CT data was obtained and used for attenuation correction and anatomic localization. Fasting blood glucose: 114 mg/dl COMPARISON:  Multiple exams, including PET-CT 11/12/2017 as well as CT chest from 11/16/2020  and MRI abdomen from 07/11/2020 FINDINGS: Mediastinal blood pool activity: SUV max 2.6 Liver activity: SUV max NA NECK: No significant abnormal hypermetabolic activity in this region. Incidental CT findings: Mild chronic left maxillary sinusitis. Bilateral common carotid atherosclerotic calcification. No hypermetabolic or pathologically enlarged adenopathy in the neck. CHEST: 1.1 by 0.9 cm left apical nodule on image 71 series 3 with maximum SUV 2.2. Low-grade activity in the right paramediastinal fibrosis without focal mass like activity observed in the right lung. AP window lymph nodes measure up to 0.9 cm in short axis and have a maximum SUV of 2.9, slightly above blood pool. Incidental CT findings: Left Port-A-Cath tip: SVC. Sebaceous cyst or similar benign subcutaneous lesion along the right upper chest anteriorly, image 60 series 3. Borderline cardiomegaly. Atherosclerotic calcification of the thoracic aorta. Mild volume loss on the right. Centrilobular emphysema. ABDOMEN/PELVIS: No significant abnormal hypermetabolic activity in this region. Incidental CT findings: Aortoiliac atherosclerotic vascular disease. Sigmoid colon diverticulosis. Borderline prostatomegaly. Lipoma along the deep margin of the left gluteus maximus muscle. SKELETON: No significant abnormal hypermetabolic activity in this region. Incidental CT findings: Hemangioma eccentric to the right in the L2 vertebral body. Sclerotic lesion probably a bone island posteriorly in the left L3 vertebral body, not appreciably changed from 07/10/2014. 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 IMPRESSION: 1. The 1.1 cm in long axis left apical nodule has maximum SUV of 2.2. Given the progression in size over the last year, the appearance is suspicious for small/low-grade adenocarcinoma. 2. Upper normal sized AP window lymph nodes have maximum SUV just above blood pool, and are nonspecific. 3. Radiation  therapy related findings in the right lung. 4. 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 5.  Aortic Atherosclerosis (ICD10-I70.0).  Emphysema (ICD10-J43.9). 6. Sigmoid colon diverticulosis. 7. Chronic left maxillary sinusitis. Electronically Signed   By: Van Clines M.D.   On: 12/01/2020 14:24     ASSESSMENT & PLAN:  1. Primary lung adenocarcinoma, right ([place])   2. Port-A-Cath in place   3. Hypokalemia   4. Liver lesion    Stage IIIA T3 N1 M0 lung adenocarcinoma, s/p concurrent chemoradiation.  Finished 1 year of durvalumab treatments. Labs are reviewed and discussed with patient.  11/17/2019, CT without contrast showed enlargement of left upper lobe/apical pulmonary nodule.  Similar right paramediastinal and perihilar radiation fibrosis.  Similar versus minimal enlargement of nonspecific borderline enlarged mediastinal nodes. 12/01/2020, PET scan showed 1.1 cm long axis left apical nodule is a maximal SUV of 2.2.  Suspicious for small/low-grade adenocarcinoma.  Upper normal-sized AP window lymph nodes nonspecific SUV activity  12/29/2020, discussed on tumor board. Slow-growing left upper lobe nodule.  CT-guided biopsy and biopsy via bronchoscopy are both of high risk due to underlying emphysema.  Consensus reached upon continue monitoring and repeat CT in 3 months.  If nodule continues to grow, he may benefit from empiric SBRT    Multiple  liver nodules,  07/11/2020 MRI liver with and without contrast showed stable hypervascular lesions in the liver parenchyma without new or progressive findings.  Lesions remain most suggestive of benign etiology.  PET scan did not show evaluation activities.   Chronic hypokalemia, potassium is stable at 3.5.  Continue oral potassium chloride 10 mEq daily.  Port-A-Cath in place, continue port flush every 6 to 8 weeks.  We spent sufficient time to discuss many aspect of care, questions were  answered to patient's satisfaction.  Return visit, to be determined. We spent sufficient time to discuss many aspect of care, questions were answered to patient's satisfaction.   [name], MD, PhD

## 2020-12-23 ENCOUNTER — Telehealth: Payer: Self-pay

## 2020-12-23 LAB — AFP TUMOR MARKER: AFP, Serum, Tumor Marker: 2.1 ng/mL (ref 0.0–8.4)

## 2020-12-23 NOTE — Telephone Encounter (Signed)
Called pt no answer. Unable to leave message as voicemail is not set up. 2nd attempt.

## 2020-12-23 NOTE — Telephone Encounter (Signed)
Called pt, line dropped. Will call again, 1st attempt.

## 2020-12-23 NOTE — Telephone Encounter (Signed)
-----   Message from Billey Co, MD sent at 12/23/2020  7:28 AM EDT ----- PSA stable, keep follow up as scheduled  [name], MD 12/23/2020

## 2020-12-26 NOTE — Telephone Encounter (Signed)
Called pt no answer. Unable to leave message as voicemail is not set up. 3rd attempt.

## 2020-12-29 NOTE — Addendum Note (Signed)
Addended by: [name] on: 12/29/2020 01:02 PM   Modules accepted: Orders

## 2020-12-30 ENCOUNTER — Telehealth: Payer: Self-pay

## 2020-12-30 DIAGNOSIS — C3491 Malignant neoplasm of unspecified part of right bronchus or lung: Secondary | ICD-10-CM

## 2020-12-30 NOTE — Telephone Encounter (Signed)
-----   Message from [name], MD sent at 12/29/2020 12:53 PM EDT ----- Please schedule him for repeat CT chest wo contrast in 3 months. Then see me lab MD visit. Same day is fine. Thanks. He knows about the plan

## 2020-12-30 NOTE — Telephone Encounter (Signed)
Please schedule CT chest in 3 months Lab/MD a few days after CT.  Please notify pt of appts.

## 2021-01-25 ENCOUNTER — Ambulatory Visit
Admission: RE | Admit: 2021-01-25 | Discharge: 2021-01-25 | Disposition: A | Discharge status: Home / Self Care | Payer: Medicare Other | Source: Ambulatory Visit | Attending: Radiation Oncology | Admitting: Radiation Oncology

## 2021-01-25 VITALS — BP 133/79 | HR 71 | Resp 18 | Wt 227.0 lb

## 2021-01-25 DIAGNOSIS — C3491 Malignant neoplasm of unspecified part of right bronchus or lung: Secondary | ICD-10-CM

## 2021-01-25 DIAGNOSIS — Z85118 Personal history of other malignant neoplasm of bronchus and lung: Secondary | ICD-10-CM | POA: Diagnosis not present

## 2021-01-25 NOTE — Progress Notes (Signed)
Radiation Oncology Follow up Note  Name: Mark Mccarty   Date:   01/25/2021 MRN:  [id number] DOB: [birth year]    This 70 y.o. male presents to the clinic today for 3-year follow-up status post concurrent chemoradiation for stage III adenocarcinoma the right lung.  REFERRING PROVIDER: [name], MD  HPI: Patient is a 70 year old male now out 3 years having completed concurrent chemoradiation therapy for stage IIIa (T3 N1 M0) adenocarcinoma the right lung.  Seen today in routine follow-up he is doing well clinically he specifically [place] cough hemoptysis or chest tightness.  We have been following a CT scan.  By CT and echo and PET scan scan criteria he has no evidence of disease except for a 1.1 cm left apical nodule which is slowly increasing in size although has an SUV of 2.2 may be indicative of a low-grade adenocarcinoma.  COMPLICATIONS OF TREATMENT: none  FOLLOW UP COMPLIANCE: keeps appointments   PHYSICAL EXAM:  BP 133/79 (BP Location: Left Arm, Patient Position: Sitting)   Pulse 71   Resp 18   Wt 227 lb (103 kg)   SpO2 98%   BMI 30.79 kg/m  Well-developed well-nourished patient in NAD. HEENT reveals PERLA, EOMI, discs not visualized.  Oral cavity is clear. No oral mucosal lesions are identified. Neck is clear without evidence of cervical or supraclavicular adenopathy. Lungs are clear to A&P. Cardiac examination is essentially unremarkable with regular rate and rhythm without murmur rub or thrill. Abdomen is benign with no organomegaly or masses noted. Motor sensory and DTR levels are equal and symmetric in the upper and lower extremities. Cranial nerves II through XII are grossly intact. Proprioception is intact. No peripheral adenopathy or edema is identified. No motor or sensory levels are noted. Crude visual fields are within normal range.  RADIOLOGY RESULTS: CT scans and PET CT scans reviewed compatible with above-stated findings  PLAN: Patient is a follow-up CT scan in  September I will see him shortly after that.  He is a high risk for biopsy should this be left apical mass be increasing would offer SBRT at that time.  Otherwise patient has no evidence of disease and continues to do well.  Patient comprehends my recommendations well.  Follow-up appointment was made.  I would like to take this opportunity to thank you for allowing me to participate in the care of your patient.[name], MD

## 2021-02-11 ENCOUNTER — Other Ambulatory Visit: Payer: Self-pay | Admitting: Oncology

## 2021-02-14 ENCOUNTER — Inpatient Hospital Stay: Payer: Medicare Other

## 2021-02-14 ENCOUNTER — Inpatient Hospital Stay: Payer: Medicare Other | Attending: Oncology

## 2021-02-14 DIAGNOSIS — Z452 Encounter for adjustment and management of vascular access device: Secondary | ICD-10-CM | POA: Diagnosis present

## 2021-02-14 DIAGNOSIS — C3411 Malignant neoplasm of upper lobe, right bronchus or lung: Secondary | ICD-10-CM | POA: Diagnosis present

## 2021-02-14 DIAGNOSIS — Z95828 Presence of other vascular implants and grafts: Secondary | ICD-10-CM

## 2021-02-14 MED ORDER — HEPARIN SOD (PORK) LOCK FLUSH 100 UNIT/ML IV SOLN
INTRAVENOUS | Status: AC
  Administered 2021-02-14: 500 [IU]
  Filled 2021-02-14: qty 5

## 2021-02-14 MED ORDER — SODIUM CHLORIDE 0.9% FLUSH
10.0000 mL | INTRAVENOUS | Status: DC | PRN
  Administered 2021-02-14: 10 mL via INTRAVENOUS
  Filled 2021-02-14: qty 10

## 2021-02-14 MED ORDER — HEPARIN SOD (PORK) LOCK FLUSH 100 UNIT/ML IV SOLN
500.0000 [IU] | Freq: Once | INTRAVENOUS | Status: DC
  Filled 2021-02-14: qty 5

## 2021-03-13 ENCOUNTER — Ambulatory Visit
Admission: RE | Admit: 2021-03-13 | Discharge: 2021-03-13 | Disposition: A | Discharge status: Home / Self Care | Payer: Medicare Other | Source: Ambulatory Visit | Attending: Oncology | Admitting: Oncology

## 2021-03-13 ENCOUNTER — Other Ambulatory Visit: Payer: Self-pay

## 2021-03-13 DIAGNOSIS — C3491 Malignant neoplasm of unspecified part of right bronchus or lung: Secondary | ICD-10-CM | POA: Diagnosis present

## 2021-03-15 ENCOUNTER — Ambulatory Visit: Payer: Medicare Other | Admitting: Oncology

## 2021-03-15 ENCOUNTER — Other Ambulatory Visit: Payer: Medicare Other

## 2021-03-16 ENCOUNTER — Encounter: Payer: Self-pay | Admitting: Oncology

## 2021-03-16 ENCOUNTER — Inpatient Hospital Stay: Payer: Medicare Other | Attending: Oncology

## 2021-03-16 ENCOUNTER — Inpatient Hospital Stay: Payer: Medicare Other | Admitting: Oncology

## 2021-03-16 VITALS — BP 131/79 | HR 66 | Temp 97.8°F | Resp 17 | Wt 223.0 lb

## 2021-03-16 DIAGNOSIS — Z79899 Other long term (current) drug therapy: Secondary | ICD-10-CM | POA: Insufficient documentation

## 2021-03-16 DIAGNOSIS — C3491 Malignant neoplasm of unspecified part of right bronchus or lung: Secondary | ICD-10-CM | POA: Diagnosis not present

## 2021-03-16 DIAGNOSIS — Z9221 Personal history of antineoplastic chemotherapy: Secondary | ICD-10-CM | POA: Diagnosis not present

## 2021-03-16 DIAGNOSIS — Z7982 Long term (current) use of aspirin: Secondary | ICD-10-CM | POA: Insufficient documentation

## 2021-03-16 DIAGNOSIS — N400 Enlarged prostate without lower urinary tract symptoms: Secondary | ICD-10-CM | POA: Insufficient documentation

## 2021-03-16 DIAGNOSIS — K7689 Other specified diseases of liver: Secondary | ICD-10-CM | POA: Diagnosis not present

## 2021-03-16 DIAGNOSIS — E876 Hypokalemia: Secondary | ICD-10-CM

## 2021-03-16 DIAGNOSIS — K769 Liver disease, unspecified: Secondary | ICD-10-CM

## 2021-03-16 DIAGNOSIS — R911 Solitary pulmonary nodule: Secondary | ICD-10-CM

## 2021-03-16 DIAGNOSIS — R918 Other nonspecific abnormal finding of lung field: Secondary | ICD-10-CM | POA: Insufficient documentation

## 2021-03-16 DIAGNOSIS — C3411 Malignant neoplasm of upper lobe, right bronchus or lung: Secondary | ICD-10-CM | POA: Diagnosis not present

## 2021-03-16 DIAGNOSIS — Z95828 Presence of other vascular implants and grafts: Secondary | ICD-10-CM

## 2021-03-16 DIAGNOSIS — Z87891 Personal history of nicotine dependence: Secondary | ICD-10-CM | POA: Insufficient documentation

## 2021-03-16 LAB — CBC WITH DIFFERENTIAL/PLATELET
Abs Immature Granulocytes: 0.02 10*3/uL (ref 0.00–0.07)
Basophils Absolute: 0 10*3/uL (ref 0.0–0.1)
Basophils Relative: 1 %
Eosinophils Absolute: 0.2 10*3/uL (ref 0.0–0.5)
Eosinophils Relative: 2 %
HCT: 43.8 % (ref 39.0–52.0)
Hemoglobin: 15.5 g/dL (ref 13.0–17.0)
Immature Granulocytes: 0 %
Lymphocytes Relative: 14 %
Lymphs Abs: 0.9 10*3/uL (ref 0.7–4.0)
MCH: 29.4 pg (ref 26.0–34.0)
MCHC: 35.4 g/dL (ref 30.0–36.0)
MCV: 83 fL (ref 80.0–100.0)
Monocytes Absolute: 0.6 10*3/uL (ref 0.1–1.0)
Monocytes Relative: 10 %
Neutro Abs: 4.6 10*3/uL (ref 1.7–7.7)
Neutrophils Relative %: 73 %
Platelets: 275 10*3/uL (ref 150–400)
RBC: 5.28 MIL/uL (ref 4.22–5.81)
RDW: 13.9 % (ref 11.5–15.5)
WBC: 6.4 10*3/uL (ref 4.0–10.5)
nRBC: 0 % (ref 0.0–0.2)

## 2021-03-16 LAB — COMPREHENSIVE METABOLIC PANEL
ALT: 23 U/L (ref 0–44)
AST: 25 U/L (ref 15–41)
Albumin: 4.3 g/dL (ref 3.5–5.0)
Alkaline Phosphatase: 60 U/L (ref 38–126)
Anion gap: 8 (ref 5–15)
BUN: 12 mg/dL (ref 8–23)
CO2: 26 mmol/L (ref 22–32)
Calcium: 9.1 mg/dL (ref 8.9–10.3)
Chloride: 102 mmol/L (ref 98–111)
Creatinine, Ser: 0.82 mg/dL (ref 0.61–1.24)
GFR, Estimated: >60 mL/min (ref >60)
Glucose, Bld: 117 mg/dL — ABNORMAL HIGH (ref 70–99)
Potassium: 3.7 mmol/L (ref 3.5–5.1)
Sodium: 136 mmol/L (ref 135–145)
Total Bilirubin: 0.6 mg/dL (ref 0.3–1.2)
Total Protein: 7.7 g/dL (ref 6.5–8.1)

## 2021-03-16 LAB — TSH: TSH: 1.72 u[IU]/mL (ref 0.350–4.500)

## 2021-03-16 LAB — T4, FREE: Free T4: 0.85 ng/dL (ref 0.61–1.12)

## 2021-03-16 NOTE — Progress Notes (Signed)
Hematology/Oncology Follow up note    Patient Care Team: [name], MD (Inactive) as PCP - General (Internal Medicine) [name], RN as Registered Nurse [name], MD as Consulting Physician (Oncology) [name], MD as Referring Physician (Radiation Oncology) [name], MD as Referring Physician (Vascular Surgery)   REASON FOR VISIT Follow up for immunotherapy treatment for stage IIIA lung adenocarcinoma  Oncology History:  Mark Mccarty is a  70 y.o.  male with  adenocarcinoma of lung, diagnosed via biopsy of right hilar lymph nodes.  Former 43 pack year smoking history.  Presents for assessment prior to concurrent chemo T3 N1 M0 August 2019 Finished  concurrent chemoradiation.   03/17/2019 Finished 1 year of durvalumab treatment.  Left upper lobe nodule 12/29/2020, discussed on tumor board. Slow-growing left upper lobe nodule.  CT-guided biopsy and biopsy via bronchoscopy are both of high risk due to underlying emphysema.  Consensus reached upon continue monitoring and repeat CT in 3 months.  If nodule continues to grow, he may benefit from empiric SBRT  INTERVAL HISTORY Mark Mccarty is a 70 y.o. male who has above history reviewed by me presents for follow-up of stage III A non-small cell lung cancer. Patient has no new complaints.  Denies any unintentional weight loss, shortness of breath, chest pain.    . Review of Systems  Constitutional:  Negative for appetite change, chills, fatigue, fever and unexpected weight change.  [name]:   Negative for hearing loss and voice change.   Eyes:  Negative for eye problems and icterus.  Respiratory:  Negative for chest tightness, cough and shortness of breath.   Cardiovascular:  Negative for chest pain and leg swelling.  Gastrointestinal:  Negative for abdominal distention and abdominal pain.  Endocrine: Negative for hot flashes.  Genitourinary:  Negative for difficulty urinating, dysuria and frequency.    Musculoskeletal:  Negative for arthralgias.  Skin:  Negative for itching and rash.  Neurological:  Negative for headaches, light-headedness and numbness.  [name]:  Negative for adenopathy. Does not bruise/bleed easily.  Psychiatric/Behavioral:  Negative for confusion.     MEDICAL HISTORY:  Past Medical History:  Diagnosis Date   Aortic insufficiency    Asthma    BPH (benign prostatic hyperplasia)    Cancer (HCC)    lung   Cataract    left cataract surgery   Centrilobular emphysema (HCC)    Centrilobular emphysema (HCC)    Colon adenomas    Diverticulosis    Duodenitis    Dysphagia    Dyspnea    [name]    ED (erectile dysfunction)    Gastritis    GERD (gastroesophageal reflux disease)    [name]    Hyperlipidemia    Hypertension    Hyperuricemia    Left ventricular diastolic dysfunction    Neuromuscular disorder (HCC)    pt has knees bilateral with tendon issues that cause pain   [name] (peptic ulcer disease)    [name]     SURGICAL HISTORY: Past Surgical History:  Procedure Laterality Date   CARDIAC CATHETERIZATION N/A 12/16/2014   Procedure: Left Heart Cath;  Surgeon: [name], MD;  Location: [facility];  Service: Cardiovascular;  Laterality: N/A;   COLONOSCOPY     COLONOSCOPY WITH PROPOFOL N/A 01/14/2020   Procedure: COLONOSCOPY WITH PROPOFOL;  Surgeon: Toledo, Benay Pike, MD;  Location: [facility];  Service: Gastroenterology;  Laterality: N/A;   CYST REMOVAL TRUNK     chest and back  over time and it was removed   CYSTECTOMY     ENDOBRONCHIAL ULTRASOUND N/A 12/09/2017   Procedure: ENDOBRONCHIAL ULTRASOUND;  Surgeon: [name], MD;  Location: [name];  Service: Pulmonary;  Laterality: N/A;   EYE SURGERY     left cataract surgery     PORTA CATH INSERTION N/A 12/23/2017   Procedure: PORTA CATH INSERTION;  Surgeon: [name], MD;  Location: [facility];  Service:  Cardiovascular;  Laterality: N/A;   UPPER GI ENDOSCOPY      SOCIAL HISTORY: Social History   Socioeconomic History   Marital status: Divorced    Spouse name: Not on file   Number of children: Not on file   Years of education: Not on file   Highest education level: Not on file  Occupational History   Not on file  Tobacco Use   Smoking status: Former    Packs/day: 1.50    Years: 35.00    Pack years: 52.50    Types: Cigarettes    Quit date: 06/22/2015    Years since quitting: 5.7   Smokeless tobacco: Never  Vaping Use   Vaping Use: Never used  Substance and Sexual Activity   Alcohol use: Yes    Comment: weekends   Drug use: No   Sexual activity: Not Currently  Other Topics Concern   Not on file  Social History Narrative   Not on file   Social Determinants of Health   Financial Resource Strain: Not on file  Food Insecurity: Not on file  Transportation Needs: Not on file  Physical Activity: Not on file  Stress: Not on file  Social Connections: Not on file  Intimate Partner Violence: Not on file    FAMILY HISTORY: Family History  Problem Relation Age of Onset   Hypertension Mother    Breast cancer Mother    Heart attack Mother    Lung cancer Father    Heart disease Sister    Diabetes Sister    Colon cancer Sister     ALLERGIES:  is allergic to penicillins.  MEDICATIONS:  Current Outpatient Medications  Medication Sig Dispense Refill   acetaminophen (TYLENOL) 500 MG tablet Take 500 mg by mouth as needed.     albuterol (PROVENTIL HFA;VENTOLIN HFA) 108 (90 Base) MCG/ACT inhaler Inhale 2 puffs into the lungs every 6 (six) hours as needed.   12   amLODipine (NORVASC) 10 MG tablet Take 10 mg by mouth daily.     ascorbic acid (VITAMIN C) 1000 MG tablet Take by mouth.     aspirin 81 MG tablet Take 81 mg by mouth daily.     carvedilol (COREG) 25 MG tablet Take 25 mg by mouth 2 (two) times daily with a meal.     Cholecalciferol 50 MCG (2000 UT) TABS Take by  mouth.     gabapentin (NEURONTIN) 300 MG capsule Take 300 mg by mouth at bedtime as needed.      HYDROcodone-acetaminophen (NORCO/VICODIN) 5-325 MG tablet Take one tablet at night for pain; may take up to every 8 hours as needed for pain if not working or driving     ibuprofen (ADVIL,MOTRIN) 600 MG tablet Take 600 mg by mouth every 6 (six) hours as needed.     ID NOW COVID-19 KIT TEST AS DIRECTED TODAY     lidocaine-prilocaine (EMLA) cream Apply to port area once 30 g 3   lisinopril (PRINIVIL,ZESTRIL) 40 MG tablet Take 40 mg by mouth daily.  potassium chloride (KLOR-CON) 10 MEQ tablet TAKE 1 TABLET BY MOUTH EVERY DAY 90 tablet 1   prochlorperazine (COMPAZINE) 10 MG tablet Take 1 tablet (10 mg total) by mouth every 6 (six) hours as needed (Nausea or vomiting). 30 tablet 1   senna (SENOKOT) 8.6 MG TABS tablet Take 2 tablets (17.2 mg total) by mouth daily. 120 each 0   tamsulosin (FLOMAX) 0.4 MG CAPS capsule Take 0.4 mg by mouth daily.     zolpidem (AMBIEN) 5 MG tablet Take 1 tablet by mouth.     pravastatin (PRAVACHOL) 20 MG tablet Take 20 mg by mouth at bedtime.     No current facility-administered medications for this visit.   Facility-Administered Medications Ordered in Other Visits  Medication Dose Route Frequency Provider Last Rate Last Admin   sodium chloride flush (NS) 0.9 % injection 10 mL  10 mL Intravenous PRN [name], MD   10 mL at 03/18/18 0858     PHYSICAL EXAMINATION: ECOG PERFORMANCE STATUS: 0 - Asymptomatic Vitals:   03/16/21 1032  BP: 131/79  Pulse: 66  Resp: 17  Temp: 97.8 F (36.6 C)  SpO2: 98%   Filed Weights   03/16/21 1032  Weight: 223 lb (101.2 kg)    Physical Exam Constitutional:      General: He is not in acute distress. [name]:     Head: Normocephalic and atraumatic.  Eyes:     General: No scleral icterus.    Conjunctiva/sclera: Conjunctivae normal.     Pupils: Pupils are equal, round, and reactive to light.  Cardiovascular:     Rate and  Rhythm: Normal rate and regular rhythm.     Heart sounds: Normal heart sounds.  Pulmonary:     Effort: Pulmonary effort is normal. No respiratory distress.     Breath sounds: No wheezing or rales.     Comments: Decreased breath sound bilaterally Chest:     Chest wall: No tenderness.  Abdominal:     General: Bowel sounds are normal. There is no distension.     Palpations: Abdomen is soft. There is no mass.     Tenderness: There is no abdominal tenderness.  Musculoskeletal:        General: No deformity. Normal range of motion.     Cervical back: Normal range of motion and neck supple.  Lymphadenopathy:     Cervical: No cervical adenopathy.  Skin:    General: Skin is warm and dry.     Findings: No erythema or rash.     Comments: Chest wall Mediport site no erythema or discharge. Left supraclavicular fossa fullness, chronic.  Neurological:     Mental Status: He is alert and oriented to person, place, and time. Mental status is at baseline.     Cranial Nerves: No cranial nerve deficit.     Coordination: Coordination normal.  Psychiatric:        Mood and Affect: Mood normal.     LABORATORY DATA:  I have reviewed the data as listed Lab Results  Component Value Date   WBC 6.4 03/16/2021   HGB 15.5 03/16/2021   HCT 43.8 03/16/2021   MCV 83.0 03/16/2021   PLT 275 03/16/2021   Recent Labs    07/25/20 0842 12/22/20 0938 03/16/21 1019  NA 136 139 136  K 3.4* 3.5 3.7  CL 99 103 102  CO2 '29 29 26  ' GLUCOSE 114* 114* 117*  BUN '13 14 12  ' CREATININE 0.83 0.81 0.82  CALCIUM 8.8* 9.1 9.1  GFRNONAA >60 >60 >60  PROT 7.6 7.6 7.7  ALBUMIN 4.2 4.1 4.3  AST '22 21 25  ' ALT '19 19 23  ' ALKPHOS 55 55 60  BILITOT 0.8 0.5 0.6    RADIOGRAPHIC STUDIES: I have personally reviewed the radiological images as listed and agreed with the findings in the report..  03/21/2018 CT chest showed mild decreasing size of the right upper lobe pulmonary nodule.  Interval decrease in size of right  paratracheal and right hilar adenopathy.  06/30/2018 CT chest showed interval development of extensive changes secondary to external beam radiation within the posterior right upper lobe and superior segment of right lower lobe.  The enlarged right paratracheal lymph node is decreased in size in the interval.  Right upper lobe lung lesion is difficult to separate from surrounding changes due to radiation but is favored to have decrease in size in the interval.  Aortic atherosclerosis and emphysema.  LAD coronary artery atherosclerotic calcification.  08/06/2018 CT head without contrast Brain parenchymal appears unremarkable there is no mass or hemorrhage.    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 Continued further decrease in the size of previously identified right.  Tracheal lymph node. Aortic atherosclerosis.  Emphysema.  CT Chest Wo Contrast  Result Date: 03/13/2021 CLINICAL DATA:  Lung cancer follow-up, history of adenocarcinoma of the RIGHT chest found to have LEFT apical nodule with increasing size and metabolic activity. EXAM: CT CHEST WITHOUT CONTRAST TECHNIQUE: Multidetector CT imaging of the chest was performed following the standard protocol without IV contrast. COMPARISON:  Comparison made with Nov 16, 2020 and with PET evaluation from December 01, 2020. FINDINGS: Cardiovascular: LEFT-sided Port-A-Cath in-situ, terminating at the caval to atrial junction as before. Scattered aortic atherosclerosis without aneurysm. Normal heart size without substantial pericardial effusion. Scattered coronary artery calcifications. Mediastinum/Nodes: Esophagus grossly normal. No adenopathy in the chest. Post treatment changes distort the RIGHT hilum. Lungs/Pleura: Background pulmonary emphysema. Irregular lobulated nodule in the LEFT upper lobe measuring 12 x 9 mm previously 11 x 8 mm. This measures 12 as  compared to 10 mm in craniocaudal dimension. (Image 28/3) pulmonary emphysema with bullous changes along the medial LEFT lung apex and severe emphysematous changes in the upper lobes. Subtle nodularity along the posterior LEFT upper lobe proximally 6 mm (image 78/2) this abuts the pleura and is unchanged compared to previous imaging. RIGHT paramediastinal fibrosis and bronchiectasis also extending from the RIGHT hilum into the RIGHT lower lobe without change. Airways are patent. Upper Abdomen: No acute upper abdominal process. Adrenal glands are normal. Musculoskeletal: No acute bone finding or destructive bone process. IMPRESSION: Irregular lobulated nodule in the LEFT upper lobe measuring 12 x 9 mm previously 11 x 8 mm. In the short this may have enlarged interval very slightly in the craniocaudal dimension as well. This remains suspicious for bronchogenic neoplasm Subtle nodularity along the posterior LEFT upper lobe proximally 6 mm is unchanged. Attention on follow-up. Post treatment changes distort the RIGHT hilum are unchanged. Coronary artery calcifications. Aortic Atherosclerosis (ICD10-I70.0) and Emphysema (ICD10-J43.9). Electronically Signed   By: Zetta Bills M.D.   On: 03/13/2021 16:42     ASSESSMENT & PLAN:  1. Primary lung adenocarcinoma, right ([place])   2. Port-A-Cath in place   3. Liver lesion   4. Nodule of left lung    Stage IIIA T3 N1 M0 lung adenocarcinoma, s/p concurrent chemoradiation.  Finished 1 year of durvalumab  treatments. Labs are reviewed and discussed with the patient. 03/13/2021, CT without contrast showed slightly increase of left upper lobe nodule 12x93m versus 11 x 8 mm.  Slightly increasing size.  Remains suspicious for bronchogenic neoplasm. Subtle nodularity along the posterior left upper lobe approximately 6 mm is unchanged.  Posttreatment changes distort the right hilum are unchanged. Previously he states has been discussed on tumor board Biopsy is of high risk due  to severe emphysema.  Consensus was reached that if nodule continues to grow, he will benefit from empiric SBRT.  Patient has an appointment with Dr. CBaruch Goutynext week for discussion of  SBRT  Multiple  liver nodules,  07/11/2020 MRI liver with and without contrast showed stable hypervascular lesions in the liver parenchyma without new or progressive findings.  Lesions remain most suggestive of benign etiology.  PET scan did not show evaluation activities.   Chronic hypokalemia, potassium is stable at 3.7.  Continue oral potassium chloride 10 mEq daily.  Port-A-Cath in place, strongly desires to have Mediport removed.  It has been more than 3 years since he is stage IIIa right upper lobe lung adenocarcinoma diagnosis.  It is reasonable to have the port removed.  Left upper lobe nodule most likely will be treated with SBRT.  Patient understands that he may need to have Mediport placed again in the future if he needs to start treatment due to recurrence/progression of disease.  We will refer him back to surgeon.  We spent sufficient time to discuss many aspect of care, questions were answered to patient's satisfaction.  Return visit, 3 months lab MD We spent sufficient time to discuss many aspect of care, questions were answered to patient's satisfaction.   ZEarlie Server MD, PhD

## 2021-03-16 NOTE — Progress Notes (Signed)
Patient here today for routine follow up. No complaints at this time.

## 2021-03-23 ENCOUNTER — Encounter: Payer: Self-pay | Admitting: Radiation Oncology

## 2021-03-23 ENCOUNTER — Ambulatory Visit
Admission: RE | Admit: 2021-03-23 | Discharge: 2021-03-23 | Disposition: A | Discharge status: Home / Self Care | Payer: Medicare Other | Source: Ambulatory Visit | Attending: Radiation Oncology | Admitting: Radiation Oncology

## 2021-03-23 VITALS — BP 129/75 | HR 70 | Temp 98.8°F | Resp 16 | Wt 226.8 lb

## 2021-03-23 DIAGNOSIS — R918 Other nonspecific abnormal finding of lung field: Secondary | ICD-10-CM | POA: Diagnosis not present

## 2021-03-23 DIAGNOSIS — Z85118 Personal history of other malignant neoplasm of bronchus and lung: Secondary | ICD-10-CM | POA: Insufficient documentation

## 2021-03-23 DIAGNOSIS — C3491 Malignant neoplasm of unspecified part of right bronchus or lung: Secondary | ICD-10-CM

## 2021-03-23 NOTE — Progress Notes (Signed)
Radiation Oncology Follow up Note  Name: Mark Mccarty   Date:   03/23/2021 MRN:  [id number] DOB: 11-Dec-1950    This 70 y.o. male presents to the clinic today for 3-year follow-up status post concurrent chemoradiation therapy for stage III adenocarcinoma the right lung.  REFERRING PROVIDER: No ref. provider found  HPI: Patient is a 70 year old male now out 3 years having completed concurrent chemoradiation therapy for stage IIIa (T3 N1 M0) adenocarcinoma the right lung he is done extremely well having no significant pulmonary symptoms no dysphagia cough or chest tightness.  We have been following a left apical nodule which is slowly increasing in size and is hypermetabolic on PET scan..  Recent CT scan has shown nodule increased slowly in size from 1.2 x 0.9 cm previously 1.1 x 0.8 cm.  This again remains highly suspicious for bronchogenic carcinoma.  COMPLICATIONS OF TREATMENT: none  FOLLOW UP COMPLIANCE: keeps appointments   PHYSICAL EXAM:  BP 129/75 (BP Location: Right Arm, Patient Position: Sitting, Cuff Size: Large)   Pulse 70   Temp 98.8 F (37.1 C)   Resp 16   Wt 226 lb 12.8 oz (102.9 kg)   SpO2 100%   BMI 30.76 kg/m  Well-developed well-nourished patient in NAD. HEENT reveals PERLA, EOMI, discs not visualized.  Oral cavity is clear. No oral mucosal lesions are identified. Neck is clear without evidence of cervical or supraclavicular adenopathy. Lungs are clear to A&P. Cardiac examination is essentially unremarkable with regular rate and rhythm without murmur rub or thrill. Abdomen is benign with no organomegaly or masses noted. Motor sensory and DTR levels are equal and symmetric in the upper and lower extremities. Cranial nerves II through XII are grossly intact. Proprioception is intact. No peripheral adenopathy or edema is identified. No motor or sensory levels are noted. Crude visual fields are within normal range.  RADIOLOGY RESULTS: CT scans and PET CT scans reviewed  compatible with above-stated findings  PLAN: At this time I have recommended SBRT to the left apical lung lesion.  We will plan on delivering 60 Gray in 5 fractions.  Use motion restriction and 4-dimensional treatment planning.  Risks and benefits of treatment were reviewed with the patient.  I have explained to him the extremely low side effect profile especially in this portion of his lung.  He comprehends my recommendations well.  Simulation appointment was made.  I would like to take this opportunity to thank you for allowing me to participate in the care of your patient.[name], MD

## 2021-03-30 ENCOUNTER — Ambulatory Visit
Admission: RE | Admit: 2021-03-30 | Discharge: 2021-03-30 | Disposition: A | Discharge status: Home / Self Care | Payer: Medicare Other | Source: Ambulatory Visit | Attending: Radiation Oncology | Admitting: Radiation Oncology

## 2021-03-30 DIAGNOSIS — Z87891 Personal history of nicotine dependence: Secondary | ICD-10-CM | POA: Diagnosis not present

## 2021-03-30 DIAGNOSIS — Z510 Encounter for antineoplastic radiation therapy: Secondary | ICD-10-CM | POA: Insufficient documentation

## 2021-03-30 DIAGNOSIS — C3412 Malignant neoplasm of upper lobe, left bronchus or lung: Secondary | ICD-10-CM | POA: Insufficient documentation

## 2021-04-09 DIAGNOSIS — C3412 Malignant neoplasm of upper lobe, left bronchus or lung: Secondary | ICD-10-CM | POA: Diagnosis present

## 2021-04-09 DIAGNOSIS — Z87891 Personal history of nicotine dependence: Secondary | ICD-10-CM | POA: Insufficient documentation

## 2021-04-09 DIAGNOSIS — Z510 Encounter for antineoplastic radiation therapy: Secondary | ICD-10-CM | POA: Insufficient documentation

## 2021-04-10 ENCOUNTER — Ambulatory Visit
Admission: RE | Admit: 2021-04-10 | Discharge: 2021-04-10 | Disposition: A | Discharge status: Home / Self Care | Payer: Medicare Other | Source: Ambulatory Visit | Attending: Radiation Oncology | Admitting: Radiation Oncology

## 2021-04-10 DIAGNOSIS — Z510 Encounter for antineoplastic radiation therapy: Secondary | ICD-10-CM | POA: Diagnosis not present

## 2021-04-11 ENCOUNTER — Ambulatory Visit: Payer: Medicare Other

## 2021-04-12 ENCOUNTER — Ambulatory Visit
Admission: RE | Admit: 2021-04-12 | Discharge: 2021-04-12 | Disposition: A | Discharge status: Home / Self Care | Payer: Medicare Other | Source: Ambulatory Visit | Attending: Radiation Oncology | Admitting: Radiation Oncology

## 2021-04-12 DIAGNOSIS — Z510 Encounter for antineoplastic radiation therapy: Secondary | ICD-10-CM | POA: Diagnosis not present

## 2021-04-13 ENCOUNTER — Ambulatory Visit: Payer: Medicare Other

## 2021-04-17 ENCOUNTER — Ambulatory Visit
Admission: RE | Admit: 2021-04-17 | Discharge: 2021-04-17 | Disposition: A | Discharge status: Home / Self Care | Payer: Medicare Other | Source: Ambulatory Visit | Attending: Radiation Oncology | Admitting: Radiation Oncology

## 2021-04-17 DIAGNOSIS — Z510 Encounter for antineoplastic radiation therapy: Secondary | ICD-10-CM | POA: Diagnosis not present

## 2021-04-18 ENCOUNTER — Ambulatory Visit: Payer: Medicare Other | Admitting: Urology

## 2021-04-19 ENCOUNTER — Ambulatory Visit
Admission: RE | Admit: 2021-04-19 | Discharge: 2021-04-19 | Disposition: A | Discharge status: Home / Self Care | Payer: Medicare Other | Source: Ambulatory Visit | Attending: Radiation Oncology | Admitting: Radiation Oncology

## 2021-04-19 DIAGNOSIS — Z510 Encounter for antineoplastic radiation therapy: Secondary | ICD-10-CM | POA: Diagnosis not present

## 2021-04-25 ENCOUNTER — Ambulatory Visit
Admission: RE | Admit: 2021-04-25 | Discharge: 2021-04-25 | Disposition: A | Discharge status: Home / Self Care | Payer: Medicare Other | Source: Ambulatory Visit | Attending: Radiation Oncology | Admitting: Radiation Oncology

## 2021-04-25 DIAGNOSIS — Z510 Encounter for antineoplastic radiation therapy: Secondary | ICD-10-CM | POA: Diagnosis not present

## 2021-05-02 ENCOUNTER — Ambulatory Visit: Payer: Medicare Other | Admitting: Urology

## 2021-05-09 ENCOUNTER — Ambulatory Visit: Payer: Medicare Other | Admitting: Urology

## 2021-05-09 ENCOUNTER — Encounter: Payer: Self-pay | Admitting: Urology

## 2021-05-09 ENCOUNTER — Other Ambulatory Visit
Admission: RE | Admit: 2021-05-09 | Discharge: 2021-05-09 | Disposition: A | Discharge status: Home / Self Care | Payer: Medicare Other | Attending: Urology | Admitting: Urology

## 2021-05-09 ENCOUNTER — Other Ambulatory Visit: Payer: Self-pay

## 2021-05-09 ENCOUNTER — Other Ambulatory Visit: Payer: Self-pay | Admitting: *Deleted

## 2021-05-09 VITALS — BP 125/71 | HR 75 | Ht 72.0 in | Wt 229.0 lb

## 2021-05-09 DIAGNOSIS — N401 Enlarged prostate with lower urinary tract symptoms: Secondary | ICD-10-CM | POA: Insufficient documentation

## 2021-05-09 DIAGNOSIS — N138 Other obstructive and reflux uropathy: Secondary | ICD-10-CM

## 2021-05-09 DIAGNOSIS — Z125 Encounter for screening for malignant neoplasm of prostate: Secondary | ICD-10-CM

## 2021-05-09 DIAGNOSIS — R351 Nocturia: Secondary | ICD-10-CM

## 2021-05-09 DIAGNOSIS — N3281 Overactive bladder: Secondary | ICD-10-CM

## 2021-05-09 DIAGNOSIS — N529 Male erectile dysfunction, unspecified: Secondary | ICD-10-CM | POA: Diagnosis not present

## 2021-05-09 LAB — BLADDER SCAN AMB NON-IMAGING

## 2021-05-09 LAB — PSA: Prostatic Specific Antigen: 5.03 ng/mL — ABNORMAL HIGH (ref 0.00–4.00)

## 2021-05-09 MED ORDER — SILDENAFIL CITRATE 50 MG PO TABS: 50.0000 mg | ORAL_TABLET | Freq: Every day | ORAL | 6 refills | Status: DC | PRN

## 2021-05-09 MED ORDER — OXYBUTYNIN CHLORIDE ER 15 MG PO TB24: 15.0000 mg | ORAL_TABLET | Freq: Every day | ORAL | 3 refills | Status: DC

## 2021-05-09 NOTE — Progress Notes (Signed)
   05/09/2021 10:59 AM   Mark Mccarty 05-Mar-1951 [id number]  Reason for visit: Follow up BPH, PSA screening, nocturia, new ED  HPI: He is a 71 year old male who was undergone treatment for stage III lung cancer, including recently radiation with Dr. Baruch Gouty.  I have followed him for PSA screening, urinary symptoms on Flomax.  Recent PSA in June 2022 was stable at 3.7 from approximately 3.2 over the last few years.  A repeat PSA was already drawn this morning, and is pending.  If PSA is normal today, we can discontinue screening per the AUA guideline recommendations.  Regarding his urinary symptoms, he is doing well on the Flomax, and is having some urgency and frequency, and ongoing nocturia 3-4 times per night.  He drinks primarily water during the day.  We discussed overactive bladder and the crossover between BPH, and he is interested in trial of OAB medication.  I sent in oxybutynin 10 mg XL, and risk, benefits, and side effects were discussed.  PVR normal today at 40 mL.  Urinalysis last year was completely benign.   He also has questions about ED today.  He previously tried Viagra and Cialis in the past, and the Viagra works best for him.  Risks and benefits discussed, and I sent in a prescription for sildenafil 50 to 100 mg on demand to [facility].  Continue Flomax Trial of oxybutynin for OAB symptoms Trial of Viagra for ED Call with PSA results RTC 1 year     Billey Co, [place] [address], [place], [place] [address] [phone]

## 2021-05-10 ENCOUNTER — Telehealth: Payer: Self-pay

## 2021-05-10 NOTE — Telephone Encounter (Signed)
Called pt no answer. Unable to leave message as no voicemail is set up. 1st attempt.

## 2021-05-10 NOTE — Telephone Encounter (Signed)
-----   Message from Billey Co, MD sent at 05/10/2021  9:09 AM EST ----- PSA increased slightly, recommend repeat lab visit for PSA with reflex to free in 6 weeks and will call with those results  [name], MD 05/10/2021

## 2021-05-11 NOTE — Telephone Encounter (Signed)
Called pt no answer. Unable to leave message as no voicemail is set up. 2nd attempt.

## 2021-05-12 ENCOUNTER — Telehealth: Payer: Self-pay

## 2021-05-12 NOTE — Telephone Encounter (Signed)
Called pt no answer. 3rd attempt. Letter mailed.

## 2021-05-12 NOTE — Telephone Encounter (Signed)
ERROR

## 2021-06-01 ENCOUNTER — Ambulatory Visit: Payer: Medicare Other | Admitting: Radiation Oncology

## 2021-06-02 ENCOUNTER — Other Ambulatory Visit: Payer: Medicare Other

## 2021-06-05 ENCOUNTER — Other Ambulatory Visit: Payer: Self-pay

## 2021-06-05 DIAGNOSIS — Z125 Encounter for screening for malignant neoplasm of prostate: Secondary | ICD-10-CM

## 2021-06-06 ENCOUNTER — Other Ambulatory Visit: Payer: Medicare Other

## 2021-06-06 ENCOUNTER — Other Ambulatory Visit: Payer: Self-pay

## 2021-06-06 DIAGNOSIS — Z125 Encounter for screening for malignant neoplasm of prostate: Secondary | ICD-10-CM

## 2021-06-07 ENCOUNTER — Telehealth: Payer: Self-pay

## 2021-06-07 LAB — PSA TOTAL (REFLEX TO FREE): Prostate Specific Ag, Serum: 4.8 ng/mL — ABNORMAL HIGH (ref 0.0–4.0)

## 2021-06-07 LAB — FPSA% REFLEX
% FREE PSA: 13.5 %
PSA, FREE: 0.65 ng/mL

## 2021-06-07 NOTE — Telephone Encounter (Signed)
-----   Message from Billey Co, MD sent at 06/07/2021  9:22 AM EST ----- PSA decreased slightly, but still mildly elevated.  Recommend follow-up in person in 3 months with PSA reflex to free prior  [name], MD 06/07/2021

## 2021-06-07 NOTE — Telephone Encounter (Signed)
Called pt, no answer. Unable to LM as voicemail is not set up.

## 2021-06-08 ENCOUNTER — Other Ambulatory Visit: Payer: Self-pay | Admitting: *Deleted

## 2021-06-08 DIAGNOSIS — C3491 Malignant neoplasm of unspecified part of right bronchus or lung: Secondary | ICD-10-CM

## 2021-06-08 NOTE — Telephone Encounter (Signed)
Mark Mccarty called back and said to please give him a call after 3, he'll be there for the rest of the day.

## 2021-06-09 ENCOUNTER — Ambulatory Visit: Payer: Medicare Other | Admitting: Radiation Oncology

## 2021-06-09 NOTE — Telephone Encounter (Signed)
Called pt, no answer. Unable to leave message. 2nd attempt.

## 2021-06-14 NOTE — Telephone Encounter (Signed)
Called pt no answer. 3rd attempt.

## 2021-06-15 ENCOUNTER — Inpatient Hospital Stay (HOSPITAL_BASED_OUTPATIENT_CLINIC_OR_DEPARTMENT_OTHER): Payer: Medicare Other | Admitting: Oncology

## 2021-06-15 ENCOUNTER — Encounter: Payer: Self-pay | Admitting: Oncology

## 2021-06-15 ENCOUNTER — Inpatient Hospital Stay: Payer: Medicare Other | Attending: Oncology

## 2021-06-15 ENCOUNTER — Other Ambulatory Visit: Payer: Self-pay

## 2021-06-15 VITALS — BP 136/82 | HR 68 | Temp 96.9°F | Wt 223.8 lb

## 2021-06-15 DIAGNOSIS — Z95828 Presence of other vascular implants and grafts: Secondary | ICD-10-CM

## 2021-06-15 DIAGNOSIS — Z79899 Other long term (current) drug therapy: Secondary | ICD-10-CM | POA: Insufficient documentation

## 2021-06-15 DIAGNOSIS — C3491 Malignant neoplasm of unspecified part of right bronchus or lung: Secondary | ICD-10-CM

## 2021-06-15 DIAGNOSIS — Z923 Personal history of irradiation: Secondary | ICD-10-CM | POA: Diagnosis not present

## 2021-06-15 DIAGNOSIS — R911 Solitary pulmonary nodule: Secondary | ICD-10-CM | POA: Diagnosis not present

## 2021-06-15 DIAGNOSIS — E876 Hypokalemia: Secondary | ICD-10-CM | POA: Insufficient documentation

## 2021-06-15 DIAGNOSIS — Z7982 Long term (current) use of aspirin: Secondary | ICD-10-CM | POA: Insufficient documentation

## 2021-06-15 DIAGNOSIS — Z87891 Personal history of nicotine dependence: Secondary | ICD-10-CM | POA: Insufficient documentation

## 2021-06-15 DIAGNOSIS — C3411 Malignant neoplasm of upper lobe, right bronchus or lung: Secondary | ICD-10-CM | POA: Diagnosis present

## 2021-06-15 DIAGNOSIS — C771 Secondary and unspecified malignant neoplasm of intrathoracic lymph nodes: Secondary | ICD-10-CM | POA: Diagnosis not present

## 2021-06-15 LAB — COMPREHENSIVE METABOLIC PANEL
ALT: 23 U/L (ref 0–44)
AST: 20 U/L (ref 15–41)
Albumin: 3.9 g/dL (ref 3.5–5.0)
Alkaline Phosphatase: 54 U/L (ref 38–126)
Anion gap: 10 (ref 5–15)
BUN: 13 mg/dL (ref 8–23)
CO2: 28 mmol/L (ref 22–32)
Calcium: 9 mg/dL (ref 8.9–10.3)
Chloride: 100 mmol/L (ref 98–111)
Creatinine, Ser: 0.8 mg/dL (ref 0.61–1.24)
GFR, Estimated: >60 mL/min (ref >60)
Glucose, Bld: 114 mg/dL — ABNORMAL HIGH (ref 70–99)
Potassium: 3.6 mmol/L (ref 3.5–5.1)
Sodium: 138 mmol/L (ref 135–145)
Total Bilirubin: 0.8 mg/dL (ref 0.3–1.2)
Total Protein: 7 g/dL (ref 6.5–8.1)

## 2021-06-15 LAB — CBC WITH DIFFERENTIAL/PLATELET
Abs Immature Granulocytes: 0.05 10*3/uL (ref 0.00–0.07)
Basophils Absolute: 0.1 10*3/uL (ref 0.0–0.1)
Basophils Relative: 1 %
Eosinophils Absolute: 0.2 10*3/uL (ref 0.0–0.5)
Eosinophils Relative: 2 %
HCT: 44.3 % (ref 39.0–52.0)
Hemoglobin: 15.4 g/dL (ref 13.0–17.0)
Immature Granulocytes: 1 %
Lymphocytes Relative: 11 %
Lymphs Abs: 0.9 10*3/uL (ref 0.7–4.0)
MCH: 29.4 pg (ref 26.0–34.0)
MCHC: 34.8 g/dL (ref 30.0–36.0)
MCV: 84.5 fL (ref 80.0–100.0)
Monocytes Absolute: 0.8 10*3/uL (ref 0.1–1.0)
Monocytes Relative: 10 %
Neutro Abs: 6.2 10*3/uL (ref 1.7–7.7)
Neutrophils Relative %: 75 %
Platelets: 228 10*3/uL (ref 150–400)
RBC: 5.24 MIL/uL (ref 4.22–5.81)
RDW: 13.9 % (ref 11.5–15.5)
WBC: 8.2 10*3/uL (ref 4.0–10.5)
nRBC: 0 % (ref 0.0–0.2)

## 2021-06-15 NOTE — Progress Notes (Signed)
Hematology/Oncology Follow up note    Patient Care Team: [name], MD (Inactive) as PCP - General (Internal Medicine) [name], RN as Registered Nurse [name], MD as Consulting Physician (Oncology) [name], MD as Referring Physician (Radiation Oncology) [name], MD as Referring Physician (Vascular Surgery)   REASON FOR VISIT Follow up for immunotherapy treatment for stage IIIA lung adenocarcinoma  Oncology History:  Mark Mccarty is a  70 y.o.  male with  adenocarcinoma of lung, diagnosed via biopsy of right hilar lymph nodes.  Former 43 pack year smoking history.  Presents for assessment prior to concurrent chemo T3 N1 M0 August 2019 Finished  concurrent chemoradiation.   03/17/2019 Finished 1 year of durvalumab treatment.  Multiple  liver nodules,  07/11/2020 MRI liver with and without contrast showed stable hypervascular lesions in the liver parenchyma without new or progressive findings.  Lesions remain most suggestive of benign etiology.  PET scan did not show evaluation activities.  Left upper lobe nodule 12/29/2020, discussed on tumor board. Slow-growing left upper lobe nodule.  CT-guided biopsy and biopsy via bronchoscopy are both of high risk due to underlying emphysema.  Consensus reached upon continue monitoring and repeat CT in 3 months.  If nodule continues to grow, he may benefit from empiric SBRT  03/13/2021, CT without contrast showed slightly increase of left upper lobe nodule 12x52mm versus 11 x 8 mm.  Slightly increasing size.  Remains suspicious for bronchogenic neoplasm. Subtle nodularity along the posterior left upper lobe approximately 6 mm is unchanged.  Posttreatment changes distort the right hilum are unchanged.  04/20/2021 - 04/25/2021 Patient received radiation to left upper lobe x nodule  INTERVAL HISTORY TYRAY PROCH is a 70 y.o. male who has above history reviewed by me presents for follow-up of stage III A non-small cell  lung cancer. Patient has no new complaints.   04/20/2021 - 04/25/2021 Patient received radiation to left apical nodule.  Patient tolerated treatment well.  Today he denies shortness of breath, chest pain, hemoptysis.  . Review of Systems  Constitutional:  Negative for appetite change, chills, fatigue, fever and unexpected weight change.  [name]:   Negative for hearing loss and voice change.   Eyes:  Negative for eye problems and icterus.  Respiratory:  Negative for chest tightness, cough and shortness of breath.   Cardiovascular:  Negative for chest pain and leg swelling.  Gastrointestinal:  Negative for abdominal distention and abdominal pain.  Endocrine: Negative for hot flashes.  Genitourinary:  Negative for difficulty urinating, dysuria and frequency.   Musculoskeletal:  Negative for arthralgias.  Skin:  Negative for itching and rash.  Neurological:  Negative for headaches, light-headedness and numbness.  [name]:  Negative for adenopathy. Does not bruise/bleed easily.  Psychiatric/Behavioral:  Negative for confusion.     MEDICAL HISTORY:  Past Medical History:  Diagnosis Date   Aortic insufficiency    Asthma    BPH (benign prostatic hyperplasia)    Cancer (HCC)    lung   Cataract    left cataract surgery   Centrilobular emphysema (HCC)    Centrilobular emphysema (HCC)    Colon adenomas    Diverticulosis    Duodenitis    Dysphagia    Dyspnea    [name]    ED (erectile dysfunction)    Gastritis    GERD (gastroesophageal reflux disease)    [name]    Hyperlipidemia    Hypertension    Hyperuricemia    Left ventricular diastolic  dysfunction    Neuromuscular disorder ([place])    pt has knees bilateral with tendon issues that cause pain   [name] (peptic ulcer disease)    [name]     SURGICAL HISTORY: Past Surgical History:  Procedure Laterality Date   CARDIAC CATHETERIZATION N/A 12/16/2014   Procedure: Left Heart Cath;   Surgeon: [name], MD;  Location: [facility];  Service: Cardiovascular;  Laterality: N/A;   COLONOSCOPY     COLONOSCOPY WITH PROPOFOL N/A 01/14/2020   Procedure: COLONOSCOPY WITH PROPOFOL;  Surgeon: Toledo, Benay Pike, MD;  Location: [facility];  Service: Gastroenterology;  Laterality: N/A;   CYST REMOVAL TRUNK     chest and back over time and it was removed   CYSTECTOMY     ENDOBRONCHIAL ULTRASOUND N/A 12/09/2017   Procedure: ENDOBRONCHIAL ULTRASOUND;  Surgeon: [name], MD;  Location: [name];  Service: Pulmonary;  Laterality: N/A;   EYE SURGERY     left cataract surgery     PORTA CATH INSERTION N/A 12/23/2017   Procedure: PORTA CATH INSERTION;  Surgeon: [name], MD;  Location: [facility];  Service: Cardiovascular;  Laterality: N/A;   UPPER GI ENDOSCOPY      SOCIAL HISTORY: Social History   Socioeconomic History   Marital status: Divorced    Spouse name: Not on file   Number of children: Not on file   Years of education: Not on file   Highest education level: Not on file  Occupational History   Not on file  Tobacco Use   Smoking status: Former    Packs/day: 1.50    Years: 35.00    Pack years: 52.50    Types: Cigarettes    Quit date: 06/22/2015    Years since quitting: 5.9   Smokeless tobacco: Never  Vaping Use   Vaping Use: Never used  Substance and Sexual Activity   Alcohol use: Yes    Comment: weekends   Drug use: No   Sexual activity: Not Currently  Other Topics Concern   Not on file  Social History Narrative   Not on file   Social Determinants of Health   Financial Resource Strain: Not on file  Food Insecurity: Not on file  Transportation Needs: Not on file  Physical Activity: Not on file  Stress: Not on file  Social Connections: Not on file  Intimate Partner Violence: Not on file    FAMILY HISTORY: Family History  Problem Relation Age of Onset   Hypertension Mother    Breast cancer Mother    Heart  attack Mother    Lung cancer Father    Heart disease Sister    Diabetes Sister    Colon cancer Sister     ALLERGIES:  is allergic to penicillins.  MEDICATIONS:  Current Outpatient Medications  Medication Sig Dispense Refill   acetaminophen (TYLENOL) 500 MG tablet Take 500 mg by mouth as needed.     amLODipine (NORVASC) 10 MG tablet Take 10 mg by mouth daily.     ascorbic acid (VITAMIN C) 1000 MG tablet Take by mouth.     aspirin 81 MG tablet Take 81 mg by mouth daily.     carvedilol (COREG) 25 MG tablet Take 25 mg by mouth 2 (two) times daily with a meal.     Cholecalciferol 50 MCG (2000 UT) TABS Take by mouth.     gabapentin (NEURONTIN) 300 MG capsule Take 300 mg by mouth at bedtime as needed.  HYDROcodone-acetaminophen (NORCO/VICODIN) 5-325 MG tablet Take one tablet at night for pain; may take up to every 8 hours as needed for pain if not working or driving     ibuprofen (ADVIL,MOTRIN) 600 MG tablet Take 600 mg by mouth every 6 (six) hours as needed.     lidocaine-prilocaine (EMLA) cream Apply to port area once 30 g 3   lisinopril (PRINIVIL,ZESTRIL) 40 MG tablet Take 40 mg by mouth daily.     oxybutynin (DITROPAN XL) 15 MG 24 hr tablet Take 1 tablet (15 mg total) by mouth at bedtime. 90 tablet 3   oxybutynin (DITROPAN XL) 15 MG 24 hr tablet Take 1 tablet by mouth at bedtime. Take 1 tablet (15 mg total) by mouth at bedtime     potassium chloride (KLOR-CON) 10 MEQ tablet TAKE 1 TABLET BY MOUTH EVERY DAY 90 tablet 1   pravastatin (PRAVACHOL) 20 MG tablet Take 20 mg by mouth at bedtime.     prochlorperazine (COMPAZINE) 10 MG tablet Take 1 tablet (10 mg total) by mouth every 6 (six) hours as needed (Nausea or vomiting). 30 tablet 1   senna (SENOKOT) 8.6 MG TABS tablet Take 2 tablets (17.2 mg total) by mouth daily. 120 each 0   sildenafil (VIAGRA) 50 MG tablet Take 1-2 tablets (50-100 mg total) by mouth daily as needed for erectile dysfunction. 30 tablet 6   tamsulosin (FLOMAX) 0.4 MG  CAPS capsule Take 0.4 mg by mouth daily.     zolpidem (AMBIEN) 5 MG tablet Take 1 tablet by mouth.     No current facility-administered medications for this visit.   Facility-Administered Medications Ordered in Other Visits  Medication Dose Route Frequency Provider Last Rate Last Admin   sodium chloride flush (NS) 0.9 % injection 10 mL  10 mL Intravenous PRN [name], MD   10 mL at 03/18/18 0858     PHYSICAL EXAMINATION: ECOG PERFORMANCE STATUS: 0 - Asymptomatic Vitals:   06/15/21 1005  BP: 136/82  Pulse: 68  Temp: (!) 96.9 F (36.1 C)   Filed Weights   06/15/21 1005  Weight: 223 lb 12.8 oz (101.5 kg)    Physical Exam Constitutional:      General: He is not in acute distress. [name]:     Head: Normocephalic and atraumatic.  Eyes:     General: No scleral icterus.    Conjunctiva/sclera: Conjunctivae normal.     Pupils: Pupils are equal, round, and reactive to light.  Cardiovascular:     Rate and Rhythm: Normal rate and regular rhythm.     Heart sounds: Normal heart sounds.  Pulmonary:     Effort: Pulmonary effort is normal. No respiratory distress.     Breath sounds: No wheezing or rales.     Comments: Decreased breath sound bilaterally Chest:     Chest wall: No tenderness.  Abdominal:     General: Bowel sounds are normal. There is no distension.     Palpations: Abdomen is soft. There is no mass.     Tenderness: There is no abdominal tenderness.  Musculoskeletal:        General: No deformity. Normal range of motion.     Cervical back: Normal range of motion and neck supple.  Lymphadenopathy:     Cervical: No cervical adenopathy.  Skin:    General: Skin is warm and dry.     Findings: No erythema or rash.     Comments: Chest wall Mediport site no erythema or discharge. Left supraclavicular fossa fullness, chronic.  Neurological:     Mental Status: He is alert and oriented to person, place, and time. Mental status is at baseline.     Cranial Nerves: No cranial  nerve deficit.     Coordination: Coordination normal.  Psychiatric:        Mood and Affect: Mood normal.     LABORATORY DATA:  I have reviewed the data as listed Lab Results  Component Value Date   WBC 8.2 06/15/2021   HGB 15.4 06/15/2021   HCT 44.3 06/15/2021   MCV 84.5 06/15/2021   PLT 228 06/15/2021   Recent Labs    12/22/20 0938 03/16/21 1019 06/15/21 0934  NA 139 136 138  K 3.5 3.7 3.6  CL 103 102 100  CO2 29 26 28   GLUCOSE 114* 117* 114*  BUN 14 12 13   CREATININE 0.81 0.82 0.80  CALCIUM 9.1 9.1 9.0  GFRNONAA >60 >60 >60  PROT 7.6 7.7 7.0  ALBUMIN 4.1 4.3 3.9  AST 21 25 20   ALT 19 23 23   ALKPHOS 55 60 54  BILITOT 0.5 0.6 0.8    RADIOGRAPHIC STUDIES: I have personally reviewed the radiological images as listed and agreed with the findings in the report..  03/21/2018 CT chest showed mild decreasing size of the right upper lobe pulmonary nodule.  Interval decrease in size of right paratracheal and right hilar adenopathy.  06/30/2018 CT chest showed interval development of extensive changes secondary to external beam radiation within the posterior right upper lobe and superior segment of right lower lobe.  The enlarged right paratracheal lymph node is decreased in size in the interval.  Right upper lobe lung lesion is difficult to separate from surrounding changes due to radiation but is favored to have decrease in size in the interval.  Aortic atherosclerosis and emphysema.  LAD coronary artery atherosclerotic calcification.  08/06/2018 CT head without contrast Brain parenchymal appears unremarkable there is no mass or hemorrhage.    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 Continued further decrease in the size of previously identified right.  Tracheal lymph node. Aortic atherosclerosis.  Emphysema.  No results found.   ASSESSMENT & PLAN:  1. Primary  lung adenocarcinoma, right ([place])   2. Port-A-Cath in place   3. Nodule of left lung    Stage IIIA T3 N1 M0 lung adenocarcinoma, s/p concurrent chemoradiation.  Finished 1 year of durvalumab treatments. Left upper lobe growing nodule status post empiric SBRT in October 2022. Will obtain CT in mid January 2023.[name]  Chronic hypokalemia, continue oral potassium chloride 10 mEq daily.  Port-A-Cath in place, we will schedule patient to have port flush every 8 weeks. We spent sufficient time to discuss many aspect of care, questions were answered to patient's satisfaction.  Return visit,  lab MD after CT. We spent sufficient time to discuss many aspect of care, questions were answered to patient's satisfaction.   [name], MD, PhD

## 2021-06-21 ENCOUNTER — Other Ambulatory Visit: Payer: Self-pay

## 2021-06-21 ENCOUNTER — Ambulatory Visit
Admission: RE | Admit: 2021-06-21 | Discharge: 2021-06-21 | Disposition: A | Discharge status: Home / Self Care | Payer: Medicare Other | Source: Ambulatory Visit | Attending: Radiation Oncology | Admitting: Radiation Oncology

## 2021-06-21 ENCOUNTER — Encounter: Payer: Self-pay | Admitting: Radiation Oncology

## 2021-06-21 VITALS — BP 134/74 | HR 72 | Wt 227.6 lb

## 2021-06-21 DIAGNOSIS — C3491 Malignant neoplasm of unspecified part of right bronchus or lung: Secondary | ICD-10-CM

## 2021-06-21 DIAGNOSIS — Z85118 Personal history of other malignant neoplasm of bronchus and lung: Secondary | ICD-10-CM | POA: Diagnosis not present

## 2021-06-21 DIAGNOSIS — Z923 Personal history of irradiation: Secondary | ICD-10-CM | POA: Insufficient documentation

## 2021-06-21 NOTE — Progress Notes (Signed)
Radiation Oncology Follow up Note  Name: Mark Mccarty   Date:   06/21/2021 MRN:  [id number] DOB: [birth year]    This 69 y.o. male presents to the clinic today for 1 month follow-up status post SBRT to his left apical lung and paving patient previously treated for stage III adenocarcinoma the right lung. REFERRING PROVIDER: No ref. provider found  HPI: Patient is a 70 year old male now at 1 month having completed SBRT to his left apical lung for slowly progressive hypermetabolic lesion consistent with non-small cell lung cancer and patient treated out 3 years prior for a T3 N1 M0 adenocarcinoma the right lung with concurrent chemoradiation.  Seen today in routine follow-up he is doing well specifically denies cough hemoptysis chest tightness or any fatigue.  He is a very low side effect profile..  COMPLICATIONS OF TREATMENT: none  FOLLOW UP COMPLIANCE: keeps appointments   PHYSICAL EXAM:  BP 134/74    Pulse 72    Wt 227 lb 9.6 oz (103.2 kg)    BMI 30.87 kg/m  Well-developed well-nourished patient in NAD. HEENT reveals PERLA, EOMI, discs not visualized.  Oral cavity is clear. No oral mucosal lesions are identified. Neck is clear without evidence of cervical or supraclavicular adenopathy. Lungs are clear to A&P. Cardiac examination is essentially unremarkable with regular rate and rhythm without murmur rub or thrill. Abdomen is benign with no organomegaly or masses noted. Motor sensory and DTR levels are equal and symmetric in the upper and lower extremities. Cranial nerves II through XII are grossly intact. Proprioception is intact. No peripheral adenopathy or edema is identified. No motor or sensory levels are noted. Crude visual fields are within normal range.  RADIOLOGY RESULTS: No current films for review  PLAN: Present time patient is doing well very low side effect profile from SBRT.  Of asked to see him back in 3 months with a CT scan of the chest prior to that visit.  Patient knows  to call with any concerns.  Clinically he is doing well.  I would like to take this opportunity to thank you for allowing me to participate in the care of your patient.[name], MD

## 2021-07-18 ENCOUNTER — Ambulatory Visit
Admission: RE | Admit: 2021-07-18 | Discharge: 2021-07-18 | Disposition: A | Discharge status: Home / Self Care | Payer: Medicare Other | Source: Ambulatory Visit | Attending: Oncology | Admitting: Oncology

## 2021-07-18 ENCOUNTER — Other Ambulatory Visit: Payer: Self-pay

## 2021-07-18 DIAGNOSIS — C3491 Malignant neoplasm of unspecified part of right bronchus or lung: Secondary | ICD-10-CM | POA: Insufficient documentation

## 2021-07-20 ENCOUNTER — Encounter: Payer: Self-pay | Admitting: Oncology

## 2021-07-20 ENCOUNTER — Inpatient Hospital Stay (HOSPITAL_BASED_OUTPATIENT_CLINIC_OR_DEPARTMENT_OTHER): Payer: Medicare Other | Admitting: Oncology

## 2021-07-20 ENCOUNTER — Inpatient Hospital Stay: Payer: Medicare Other | Attending: Oncology

## 2021-07-20 ENCOUNTER — Other Ambulatory Visit: Payer: Self-pay

## 2021-07-20 VITALS — BP 134/81 | HR 70 | Temp 96.3°F | Resp 18 | Wt 228.5 lb

## 2021-07-20 DIAGNOSIS — C3491 Malignant neoplasm of unspecified part of right bronchus or lung: Secondary | ICD-10-CM

## 2021-07-20 DIAGNOSIS — C3412 Malignant neoplasm of upper lobe, left bronchus or lung: Secondary | ICD-10-CM | POA: Diagnosis present

## 2021-07-20 DIAGNOSIS — R911 Solitary pulmonary nodule: Secondary | ICD-10-CM | POA: Diagnosis not present

## 2021-07-20 DIAGNOSIS — Z79899 Other long term (current) drug therapy: Secondary | ICD-10-CM | POA: Insufficient documentation

## 2021-07-20 DIAGNOSIS — Z7982 Long term (current) use of aspirin: Secondary | ICD-10-CM | POA: Insufficient documentation

## 2021-07-20 DIAGNOSIS — E876 Hypokalemia: Secondary | ICD-10-CM | POA: Insufficient documentation

## 2021-07-20 DIAGNOSIS — Z95828 Presence of other vascular implants and grafts: Secondary | ICD-10-CM | POA: Diagnosis not present

## 2021-07-20 DIAGNOSIS — Z87891 Personal history of nicotine dependence: Secondary | ICD-10-CM | POA: Diagnosis not present

## 2021-07-20 LAB — CBC WITH DIFFERENTIAL/PLATELET
Abs Immature Granulocytes: 0.03 10*3/uL (ref 0.00–0.07)
Basophils Absolute: 0 10*3/uL (ref 0.0–0.1)
Basophils Relative: 0 %
Eosinophils Absolute: 0.1 10*3/uL (ref 0.0–0.5)
Eosinophils Relative: 2 %
HCT: 42.5 % (ref 39.0–52.0)
Hemoglobin: 14.8 g/dL (ref 13.0–17.0)
Immature Granulocytes: 0 %
Lymphocytes Relative: 14 %
Lymphs Abs: 1 10*3/uL (ref 0.7–4.0)
MCH: 29.5 pg (ref 26.0–34.0)
MCHC: 34.8 g/dL (ref 30.0–36.0)
MCV: 84.7 fL (ref 80.0–100.0)
Monocytes Absolute: 0.9 10*3/uL (ref 0.1–1.0)
Monocytes Relative: 12 %
Neutro Abs: 5.4 10*3/uL (ref 1.7–7.7)
Neutrophils Relative %: 72 %
Platelets: 242 10*3/uL (ref 150–400)
RBC: 5.02 MIL/uL (ref 4.22–5.81)
RDW: 14.3 % (ref 11.5–15.5)
WBC: 7.4 10*3/uL (ref 4.0–10.5)
nRBC: 0 % (ref 0.0–0.2)

## 2021-07-20 LAB — COMPREHENSIVE METABOLIC PANEL
ALT: 19 U/L (ref 0–44)
AST: 22 U/L (ref 15–41)
Albumin: 4.2 g/dL (ref 3.5–5.0)
Alkaline Phosphatase: 56 U/L (ref 38–126)
Anion gap: 7 (ref 5–15)
BUN: 12 mg/dL (ref 8–23)
CO2: 28 mmol/L (ref 22–32)
Calcium: 9 mg/dL (ref 8.9–10.3)
Chloride: 104 mmol/L (ref 98–111)
Creatinine, Ser: 0.76 mg/dL (ref 0.61–1.24)
GFR, Estimated: >60 mL/min (ref >60)
Glucose, Bld: 120 mg/dL — ABNORMAL HIGH (ref 70–99)
Potassium: 3.4 mmol/L — ABNORMAL LOW (ref 3.5–5.1)
Sodium: 139 mmol/L (ref 135–145)
Total Bilirubin: 0.3 mg/dL (ref 0.3–1.2)
Total Protein: 7.1 g/dL (ref 6.5–8.1)

## 2021-07-20 NOTE — Progress Notes (Signed)
Hematology/Oncology Follow up note    Patient Care Team: [name], MD (Inactive) as PCP - General (Internal Medicine) [name], RN as Registered Nurse [name], MD as Consulting Physician (Oncology) [name], MD as Referring Physician (Radiation Oncology) [name], MD as Referring Physician (Vascular Surgery)   REASON FOR VISIT Follow up for immunotherapy treatment for stage IIIA lung adenocarcinoma  Oncology History:  Mark Mccarty is a  71 y.o.  male with  adenocarcinoma of lung, diagnosed via biopsy of right hilar lymph nodes.  Former 43 pack year smoking history.  Presents for assessment prior to concurrent chemo T3 N1 M0 August 2019 Finished  concurrent chemoradiation.   03/17/2019 Finished 1 year of durvalumab treatment.  Multiple  liver nodules,  07/11/2020 MRI liver with and without contrast showed stable hypervascular lesions in the liver parenchyma without new or progressive findings.  Lesions remain most suggestive of benign etiology.  PET scan did not show evaluation activities.  Left upper lobe nodule 12/29/2020, discussed on tumor board. Slow-growing left upper lobe nodule.  CT-guided biopsy and biopsy via bronchoscopy are both of high risk due to underlying emphysema.  Consensus reached upon continue monitoring and repeat CT in 3 months.  If nodule continues to grow, he may benefit from empiric SBRT  03/13/2021, CT without contrast showed slightly increase of left upper lobe nodule 12x15mm versus 11 x 8 mm.  Slightly increasing size.  Remains suspicious for bronchogenic neoplasm. Subtle nodularity along the posterior left upper lobe approximately 6 mm is unchanged.  Posttreatment changes distort the right hilum are unchanged.  04/20/2021 - 04/25/2021 Patient received radiation to left upper lobe x nodule 04/20/2021 - 04/25/2021 Patient received radiation to left apical nodule.   INTERVAL HISTORY Mark Mccarty is a 71 y.o. male who has above  history reviewed by me presents for follow-up of stage III A non-small cell lung cancer. Patient reports feeling well.  No new complaints today.  . Review of Systems  Constitutional:  Negative for appetite change, chills, fatigue, fever and unexpected weight change.  [name]:   Negative for hearing loss and voice change.   Eyes:  Negative for eye problems and icterus.  Respiratory:  Negative for chest tightness, cough and shortness of breath.   Cardiovascular:  Negative for chest pain and leg swelling.  Gastrointestinal:  Negative for abdominal distention and abdominal pain.  Endocrine: Negative for hot flashes.  Genitourinary:  Negative for difficulty urinating, dysuria and frequency.   Musculoskeletal:  Negative for arthralgias.  Skin:  Negative for itching and rash.  Neurological:  Negative for headaches, light-headedness and numbness.  [name]:  Negative for adenopathy. Does not bruise/bleed easily.  Psychiatric/Behavioral:  Negative for confusion.     MEDICAL HISTORY:  Past Medical History:  Diagnosis Date   Aortic insufficiency    Asthma    BPH (benign prostatic hyperplasia)    Cancer (HCC)    lung   Cataract    left cataract surgery   Centrilobular emphysema (HCC)    Centrilobular emphysema (HCC)    Colon adenomas    Diverticulosis    Duodenitis    Dysphagia    Dyspnea    [name]    ED (erectile dysfunction)    Gastritis    GERD (gastroesophageal reflux disease)    [name]    Hyperlipidemia    Hypertension    Hyperuricemia    Left ventricular diastolic dysfunction    Neuromuscular disorder ([place])    pt has  knees bilateral with tendon issues that cause pain   [name] (peptic ulcer disease)    [name]     SURGICAL HISTORY: Past Surgical History:  Procedure Laterality Date   CARDIAC CATHETERIZATION N/A 12/16/2014   Procedure: Left Heart Cath;  Surgeon: [name], MD;  Location: [facility];  Service:  Cardiovascular;  Laterality: N/A;   COLONOSCOPY     COLONOSCOPY WITH PROPOFOL N/A 01/14/2020   Procedure: COLONOSCOPY WITH PROPOFOL;  Surgeon: Toledo, Benay Pike, MD;  Location: [facility];  Service: Gastroenterology;  Laterality: N/A;   CYST REMOVAL TRUNK     chest and back over time and it was removed   CYSTECTOMY     ENDOBRONCHIAL ULTRASOUND N/A 12/09/2017   Procedure: ENDOBRONCHIAL ULTRASOUND;  Surgeon: [name], MD;  Location: [name];  Service: Pulmonary;  Laterality: N/A;   EYE SURGERY     left cataract surgery     PORTA CATH INSERTION N/A 12/23/2017   Procedure: PORTA CATH INSERTION;  Surgeon: [name], MD;  Location: [facility];  Service: Cardiovascular;  Laterality: N/A;   UPPER GI ENDOSCOPY      SOCIAL HISTORY: Social History   Socioeconomic History   Marital status: Divorced    Spouse name: Not on file   Number of children: Not on file   Years of education: Not on file   Highest education level: Not on file  Occupational History   Not on file  Tobacco Use   Smoking status: Former    Packs/day: 1.50    Years: 35.00    Pack years: 52.50    Types: Cigarettes    Quit date: 06/22/2015    Years since quitting: 6.0   Smokeless tobacco: Never  Vaping Use   Vaping Use: Never used  Substance and Sexual Activity   Alcohol use: Yes    Comment: weekends   Drug use: No   Sexual activity: Not Currently  Other Topics Concern   Not on file  Social History Narrative   Not on file   Social Determinants of Health   Financial Resource Strain: Not on file  Food Insecurity: Not on file  Transportation Needs: Not on file  Physical Activity: Not on file  Stress: Not on file  Social Connections: Not on file  Intimate Partner Violence: Not on file    FAMILY HISTORY: Family History  Problem Relation Age of Onset   Hypertension Mother    Breast cancer Mother    Heart attack Mother    Lung cancer Father    Heart disease Sister    Diabetes  Sister    Colon cancer Sister     ALLERGIES:  is allergic to penicillins.  MEDICATIONS:  Current Outpatient Medications  Medication Sig Dispense Refill   acetaminophen (TYLENOL) 500 MG tablet Take 500 mg by mouth as needed.     amLODipine (NORVASC) 10 MG tablet Take 10 mg by mouth daily.     ascorbic acid (VITAMIN C) 1000 MG tablet Take by mouth.     aspirin 81 MG tablet Take 81 mg by mouth daily.     carvedilol (COREG) 25 MG tablet Take 25 mg by mouth 2 (two) times daily with a meal.     Cholecalciferol 50 MCG (2000 UT) TABS Take by mouth.     gabapentin (NEURONTIN) 300 MG capsule Take 300 mg by mouth at bedtime as needed.      HYDROcodone-acetaminophen (NORCO/VICODIN) 5-325 MG tablet Take one tablet at  night for pain; may take up to every 8 hours as needed for pain if not working or driving     ibuprofen (ADVIL,MOTRIN) 600 MG tablet Take 600 mg by mouth every 6 (six) hours as needed.     lidocaine-prilocaine (EMLA) cream Apply to port area once 30 g 3   lisinopril (PRINIVIL,ZESTRIL) 40 MG tablet Take 40 mg by mouth daily.     oxybutynin (DITROPAN XL) 15 MG 24 hr tablet Take 1 tablet (15 mg total) by mouth at bedtime. 90 tablet 3   potassium chloride (KLOR-CON) 10 MEQ tablet TAKE 1 TABLET BY MOUTH EVERY DAY 90 tablet 1   pravastatin (PRAVACHOL) 20 MG tablet Take 20 mg by mouth at bedtime.     senna (SENOKOT) 8.6 MG TABS tablet Take 2 tablets (17.2 mg total) by mouth daily. 120 each 0   sildenafil (VIAGRA) 50 MG tablet Take 1-2 tablets (50-100 mg total) by mouth daily as needed for erectile dysfunction. 30 tablet 6   tamsulosin (FLOMAX) 0.4 MG CAPS capsule Take 0.4 mg by mouth daily.     zolpidem (AMBIEN) 5 MG tablet Take 1 tablet by mouth.     oxybutynin (DITROPAN XL) 15 MG 24 hr tablet Take 1 tablet by mouth at bedtime. Take 1 tablet (15 mg total) by mouth at bedtime (Patient not taking: Reported on 07/20/2021)     prochlorperazine (COMPAZINE) 10 MG tablet Take 1 tablet (10 mg total) by  mouth every 6 (six) hours as needed (Nausea or vomiting). (Patient not taking: Reported on 07/20/2021) 30 tablet 1   No current facility-administered medications for this visit.   Facility-Administered Medications Ordered in Other Visits  Medication Dose Route Frequency Provider Last Rate Last Admin   sodium chloride flush (NS) 0.9 % injection 10 mL  10 mL Intravenous PRN [name], MD   10 mL at 03/18/18 0858     PHYSICAL EXAMINATION: ECOG PERFORMANCE STATUS: 0 - Asymptomatic Vitals:   07/20/21 1049  BP: 134/81  Pulse: 70  Resp: 18  Temp: (!) 96.3 F (35.7 C)  SpO2: 95%   Filed Weights   07/20/21 1049  Weight: 228 lb 8 oz (103.6 kg)    Physical Exam Constitutional:      General: He is not in acute distress. [name]:     Head: Normocephalic and atraumatic.  Eyes:     General: No scleral icterus.    Conjunctiva/sclera: Conjunctivae normal.     Pupils: Pupils are equal, round, and reactive to light.  Cardiovascular:     Rate and Rhythm: Normal rate and regular rhythm.     Heart sounds: Normal heart sounds.  Pulmonary:     Effort: Pulmonary effort is normal. No respiratory distress.     Breath sounds: No wheezing or rales.     Comments: Decreased breath sound bilaterally Chest:     Chest wall: No tenderness.  Abdominal:     General: Bowel sounds are normal. There is no distension.     Palpations: Abdomen is soft. There is no mass.     Tenderness: There is no abdominal tenderness.  Musculoskeletal:        General: No deformity. Normal range of motion.     Cervical back: Normal range of motion and neck supple.  Lymphadenopathy:     Cervical: No cervical adenopathy.  Skin:    General: Skin is warm and dry.     Findings: No erythema or rash.     Comments: Chest wall Mediport site  no erythema or discharge. Left supraclavicular fossa fullness, chronic.  Neurological:     Mental Status: He is alert and oriented to person, place, and time. Mental status is at baseline.      Cranial Nerves: No cranial nerve deficit.     Coordination: Coordination normal.  Psychiatric:        Mood and Affect: Mood normal.     LABORATORY DATA:  I have reviewed the data as listed Lab Results  Component Value Date   WBC 7.4 07/20/2021   HGB 14.8 07/20/2021   HCT 42.5 07/20/2021   MCV 84.7 07/20/2021   PLT 242 07/20/2021   Recent Labs    03/16/21 1019 06/15/21 0934 07/20/21 1038  NA 136 138 139  K 3.7 3.6 3.4*  CL 102 100 104  CO2 26 28 28   GLUCOSE 117* 114* 120*  BUN 12 13 12   CREATININE 0.82 0.80 0.76  CALCIUM 9.1 9.0 9.0  GFRNONAA >60 >60 >60  PROT 7.7 7.0 7.1  ALBUMIN 4.3 3.9 4.2  AST 25 20 22   ALT 23 23 19   ALKPHOS 60 54 56  BILITOT 0.6 0.8 0.3    RADIOGRAPHIC STUDIES: I have personally reviewed the radiological images as listed and agreed with the findings in the report..  03/21/2018 CT chest showed mild decreasing size of the right upper lobe pulmonary nodule.  Interval decrease in size of right paratracheal and right hilar adenopathy.  06/30/2018 CT chest showed interval development of extensive changes secondary to external beam radiation within the posterior right upper lobe and superior segment of right lower lobe.  The enlarged right paratracheal lymph node is decreased in size in the interval.  Right upper lobe lung lesion is difficult to separate from surrounding changes due to radiation but is favored to have decrease in size in the interval.  Aortic atherosclerosis and emphysema.  LAD coronary artery atherosclerotic calcification.  08/06/2018 CT head without contrast Brain parenchymal appears unremarkable there is no mass or hemorrhage.    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 Continued further decrease in the size of previously identified right.  Tracheal lymph node. Aortic atherosclerosis.  Emphysema.  CT CHEST WO  CONTRAST  Result Date: 07/18/2021 CLINICAL DATA:  Restaging lung cancer. EXAM: CT CHEST WITHOUT CONTRAST TECHNIQUE: Multidetector CT imaging of the chest was performed following the standard protocol without IV contrast. RADIATION DOSE REDUCTION: This exam was performed according to the departmental dose-optimization program which includes automated exposure control, adjustment of the mA and/or kV according to patient size and/or use of iterative reconstruction technique. COMPARISON:  03/13/2021. FINDINGS: Cardiovascular: The heart size is normal. Aortic atherosclerosis identified. Coronary artery atherosclerotic calcifications. No signs of pericardial effusion. Mediastinum/Nodes: No enlarged mediastinal or axillary lymph nodes. Thyroid gland, trachea, and esophagus demonstrate no significant findings. Lungs/Pleura: Moderate to advanced changes of paraseptal and centrilobular emphysema. Paramediastinal and perihilar radiation change within the right lung is again noted. There is secondary volume loss and architectural distortion as before. No changes from previous exam to suggest recurrent tumor in the right hemithorax. Previously characterized nodule in the left upper lobe measures 0.8 x 0.5 cm, image 29/3. Previously this measured 1.2 x 0.9 cm. Stable subpleural nodule within the periphery of the superior segment of left lower lobe, image 71/3. No new findings Upper Abdomen: No acute abnormality. Musculoskeletal: No chest wall mass or suspicious bone lesions identified. IMPRESSION: 1. Stable post  treatment changes within the right lung. No findings to suggest recurrent tumor or metastatic disease. 2. Interval decrease in size of previously characterized irregular lobulated nodule in the left upper lobe. 3. Aortic Atherosclerosis (ICD10-I70.0) and Emphysema (ICD10-J43.9). 4. Coronary artery calcifications. Electronically Signed   By: Kerby Moors M.D.   On: 07/18/2021 11:43     ASSESSMENT & PLAN:  1.  Primary lung adenocarcinoma, right ([place])   2. Port-A-Cath in place   3. Nodule of left lung    Stage IIIA T3 N1 M0 lung adenocarcinoma, s/p concurrent chemoradiation.  Finished 1 year of durvalumab treatments. Left upper lobe growing nodule status post empiric SBRT in October 2022. 07/18/2021, CT chest without contrast showed stable posttreatment changes within the right lung.  No findings to suggest recurrent tumor or metastatic disease.  Interval decrease in size of previously characterized irregular nodule in the left upper lobe.  Chronic hypokalemia, continue oral potassium chloride 10 mEq daily.  Port-A-Cath in place, continue port flush every 8 weeks. We spent sufficient time to discuss many aspect of care, questions were answered to patient's satisfaction.  Return visit, repeat CT chest in 3 months.  Lab MD few days after CT. We spent sufficient time to discuss many aspect of care, questions were answered to patient's satisfaction.   [name], MD, PhD

## 2021-07-20 NOTE — Progress Notes (Signed)
Patient here for follow up. Advance directive paperwork given to pt.

## 2021-08-10 ENCOUNTER — Inpatient Hospital Stay: Payer: Medicare Other | Attending: Oncology

## 2021-08-10 ENCOUNTER — Other Ambulatory Visit: Payer: Self-pay

## 2021-08-10 DIAGNOSIS — Z85118 Personal history of other malignant neoplasm of bronchus and lung: Secondary | ICD-10-CM | POA: Insufficient documentation

## 2021-08-10 DIAGNOSIS — Z95828 Presence of other vascular implants and grafts: Secondary | ICD-10-CM

## 2021-08-10 DIAGNOSIS — E876 Hypokalemia: Secondary | ICD-10-CM | POA: Diagnosis not present

## 2021-08-10 DIAGNOSIS — Z452 Encounter for adjustment and management of vascular access device: Secondary | ICD-10-CM | POA: Insufficient documentation

## 2021-08-10 MED ORDER — HEPARIN SOD (PORK) LOCK FLUSH 100 UNIT/ML IV SOLN
500.0000 [IU] | Freq: Once | INTRAVENOUS | Status: AC
  Administered 2021-08-10: 500 [IU] via INTRAVENOUS
  Filled 2021-08-10: qty 5

## 2021-08-10 MED ORDER — SODIUM CHLORIDE 0.9% FLUSH
10.0000 mL | INTRAVENOUS | Status: DC | PRN
  Administered 2021-08-10: 10 mL via INTRAVENOUS
  Filled 2021-08-10: qty 10

## 2021-08-11 ENCOUNTER — Other Ambulatory Visit: Payer: Self-pay | Admitting: Oncology

## 2021-08-11 NOTE — Telephone Encounter (Signed)
°  Component Ref Range & Units 3 wk ago (07/20/21) 1 mo ago (06/15/21) 4 mo ago (03/16/21) 7 mo ago (12/22/20) 1 yr ago (07/25/20) 1 yr ago (03/11/20) 1 yr ago (11/11/19)  Potassium 3.5 - 5.1 mmol/L 3.4 Low   3.6  3.7  3.5  3.4 Low   3.4 Low   3.5

## 2021-08-12 ENCOUNTER — Encounter: Payer: Self-pay | Admitting: Oncology

## 2021-09-18 ENCOUNTER — Ambulatory Visit: Payer: Medicare Other | Admitting: Radiation Oncology

## 2021-09-19 ENCOUNTER — Other Ambulatory Visit
Admission: RE | Admit: 2021-09-19 | Discharge: 2021-09-19 | Disposition: A | Discharge status: Home / Self Care | Payer: Medicare Other | Attending: Urology | Admitting: Urology

## 2021-09-19 ENCOUNTER — Encounter: Payer: Self-pay | Admitting: Urology

## 2021-09-19 ENCOUNTER — Other Ambulatory Visit: Payer: Self-pay

## 2021-09-19 ENCOUNTER — Other Ambulatory Visit: Payer: Self-pay | Admitting: *Deleted

## 2021-09-19 ENCOUNTER — Ambulatory Visit: Payer: Medicare Other | Admitting: Urology

## 2021-09-19 VITALS — BP 153/79 | HR 82 | Ht 72.0 in | Wt 223.0 lb

## 2021-09-19 DIAGNOSIS — N401 Enlarged prostate with lower urinary tract symptoms: Secondary | ICD-10-CM | POA: Diagnosis present

## 2021-09-19 DIAGNOSIS — R972 Elevated prostate specific antigen [PSA]: Secondary | ICD-10-CM | POA: Diagnosis not present

## 2021-09-19 DIAGNOSIS — N138 Other obstructive and reflux uropathy: Secondary | ICD-10-CM | POA: Insufficient documentation

## 2021-09-19 DIAGNOSIS — N529 Male erectile dysfunction, unspecified: Secondary | ICD-10-CM

## 2021-09-19 DIAGNOSIS — N3281 Overactive bladder: Secondary | ICD-10-CM

## 2021-09-19 LAB — PSA: Prostatic Specific Antigen: 4.42 ng/mL — ABNORMAL HIGH (ref 0.00–4.00)

## 2021-09-19 MED ORDER — SOLIFENACIN SUCCINATE 10 MG PO TABS: 10.0000 mg | ORAL_TABLET | Freq: Every day | ORAL | 6 refills | Status: DC

## 2021-09-19 NOTE — Progress Notes (Signed)
? ?  09/19/2021 ?11:14 AM  ? ?Mark Mccarty ?22-Nov-1950 ?[id number] ? ?Reason for visit: BPH, OAB, PSA screening, ED ? ?HPI: ?71 year old male with stage III lung cancer treated with chemo radiation and followed by oncology as well as Dr. Baruch Gouty. ? ?He had a slight rise in his PSA in November 2022 to 5.03 from 3.7 previously, and repeat PSA decreased slightly to 4.8(13.5% free).  He opted for a repeat PSA which is still pending today.  We reviewed the AUA guidelines that do not recommend routine screening in men over age 20, as well as in those with other comorbidities like lung cancer.  We reviewed options including observation, prostate MRI, or prostate biopsy.  He is more interested in a prostate MRI if the PSA remains significantly elevated. ? ?At our last visit we trialed oxybutynin XL for his OAB symptoms of urgency and urge incontinence, and he denies any significant improvement on this medication.  We reviewed behavioral strategies, and I recommended a trial of Vesicare 10 mg daily.  Risks and benefits discussed. ? ?Regarding his ED, the sildenafil 50 to 100 mg on demand is working well. ? ?-Trial of Vesicare 10 mg for OAB ?-Call with PSA results, if normalized discontinue screening per guidelines, if remains significantly elevated consider prostate MRI versus repeat in 6 months ? ?Billey Co, MD ? ?[facility] ?[address], [address] ?[place], [place] [address] ?[phone] ? ? ?

## 2021-09-19 NOTE — Patient Instructions (Signed)
Prostate Cancer Screening ?Prostate cancer screening is testing that is done to check for the presence of prostate cancer in men. The prostate gland is a walnut-sized gland that is located below the bladder and in front of the rectum in males. The function of the prostate is to add fluid to semen during ejaculation. Prostate cancer is one of the most common types of cancer in men. ?Who should have prostate cancer screening? ?Screening recommendations vary based on age and other risk factors, as well as between the professional organizations who make the recommendations. ?In general, screening is recommended if: ?You are age 37 to 4 and have an average risk for prostate cancer. You should talk with your health care provider about your need for screening and how often screening should be done. Because most prostate cancers are slow growing and will not cause death, screening in this age group is generally reserved for men who have a 66- to 15-year life expectancy. ?You are younger than age 67, and you have these risk factors: ?Having a father, brother, or uncle who has been diagnosed with prostate cancer. The risk is higher if your family member's cancer occurred at an early age or if you have multiple family members with prostate cancer at an early age. ?Being a male who is [place] or is of [place] or sub-Saharan African descent. ?In general, screening is not recommended if: ?You are younger than age 61. ?You are between the ages of 65 and 21 and you have no risk factors. ?You are 54 years of age or older. At this age, the risks that screening can cause are greater than the benefits that it may provide. ?If you are at high risk for prostate cancer, your health care provider may recommend that you have screenings more often or that you start screening at a younger age. ?How is screening for prostate cancer done? ?The recommended prostate cancer screening test is a blood test called the prostate-specific antigen (PSA)  test. PSA is a protein that is made in the prostate. As you age, your prostate naturally produces more PSA. Abnormally high PSA levels may be caused by: ?Prostate cancer. ?An enlarged prostate that is not caused by cancer (benign prostatic hyperplasia, or BPH). This condition is very common in older men. ?A prostate gland infection (prostatitis) or urinary tract infection. ?Certain medicines such as male hormones (like testosterone) or other medicines that raise testosterone levels. ?A rectal exam may be done as part of prostate cancer screening to help provide information about the size of your prostate gland. When a rectal exam is performed, it should be done after the PSA level is drawn to avoid any effect on the results. ?Depending on the PSA results, you may need more tests, such as: ?A physical exam to check the size of your prostate gland, if not done as part of screening. ?Blood and imaging tests. ?A procedure to remove tissue samples from your prostate gland for testing (biopsy). This is the only way to know for certain if you have prostate cancer. ?What are the benefits of prostate cancer screening? ?Screening can help to identify cancer at an early stage, before symptoms start and when the cancer can be treated more easily. ?There is a small chance that screening may lower your risk of dying from prostate cancer. The chance is small because prostate cancer is a slow-growing cancer, and most men with prostate cancer die from a different cause. ?What are the risks of prostate cancer screening? ?The main  risk of prostate cancer screening is diagnosing and treating prostate cancer that would never have caused any symptoms or problems. This is called overdiagnosisand overtreatment. PSA screening cannot tell you if your PSA is high due to cancer or a different cause. A prostate biopsy is the only procedure to diagnose prostate cancer. Even the results of a biopsy may not tell you if your cancer needs to be  treated. Slow-growing prostate cancer may not need any treatment other than monitoring, so diagnosing and treating it may cause unnecessary stress or other side effects. ?Questions to ask your health care provider ?When should I start prostate cancer screening? ?What is my risk for prostate cancer? ?How often do I need screening? ?What type of screening tests do I need? ?How do I get my test results? ?What do my results mean? ?Do I need treatment? ?Where to find more information ?The American Cancer Society: www.cancer.org ?American Urological Association: www.auanet.org ?Contact a health care provider if: ?You have difficulty urinating. ?You have pain when you urinate or ejaculate. ?You have blood in your urine or semen. ?You have pain in your back or in the area of your prostate. ?Summary ?Prostate cancer is a common type of cancer in men. The prostate gland is located below the bladder and in front of the rectum. This gland adds fluid to semen during ejaculation. ?Prostate cancer screening may identify cancer at an early stage, when the cancer can be treated more easily and is less likely to have spread to other areas of the body. ?The prostate-specific antigen (PSA) test is the recommended screening test for prostate cancer, but it has associated risks. ?Discuss the risks and benefits of prostate cancer screening with your health care provider. If you are age 71 or older, the risks that screening can cause are greater than the benefits that it may provide. ?This information is not intended to replace advice given to you by your health care provider. Make sure you discuss any questions you have with your health care provider. ?Document Revised: 12/12/2020 Document Reviewed: 12/12/2020 ?Elsevier Patient Education ? [place]. ? ?

## 2021-09-20 ENCOUNTER — Encounter: Payer: Self-pay | Admitting: Radiation Oncology

## 2021-09-20 ENCOUNTER — Ambulatory Visit
Admission: RE | Admit: 2021-09-20 | Discharge: 2021-09-20 | Disposition: A | Discharge status: Home / Self Care | Payer: Medicare Other | Source: Ambulatory Visit | Attending: Radiation Oncology | Admitting: Radiation Oncology

## 2021-09-20 VITALS — BP 144/87 | HR 65 | Temp 96.5°F | Resp 18 | Ht 72.0 in | Wt 220.4 lb

## 2021-09-20 DIAGNOSIS — Z923 Personal history of irradiation: Secondary | ICD-10-CM | POA: Insufficient documentation

## 2021-09-20 DIAGNOSIS — Z85118 Personal history of other malignant neoplasm of bronchus and lung: Secondary | ICD-10-CM | POA: Diagnosis present

## 2021-09-20 DIAGNOSIS — C3491 Malignant neoplasm of unspecified part of right bronchus or lung: Secondary | ICD-10-CM

## 2021-09-20 NOTE — Progress Notes (Signed)
Radiation Oncology ?Follow up Note ? ?Name: Mark Mccarty   ?Date:   09/20/2021 ?MRN:  [id number] ?DOB: Mar 27, 1951  ? ? ?This 71 y.o. male presents to the clinic today for 28-month follow-up status post SBRT to a left apical lung lesion improved patient previously treated for stage III adenocarcinoma the right lung. ? ?REFERRING PROVIDER: No ref. provider found ? ?HPI: Patient is a 71 year old male now 3 months having completed SBRT to his left apical lung for presumed stage I adenocarcinoma.  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  Seen today in routine follow-up he is a mild nonproductive cough specifically denies dysphagia hemoptysis or chest tightness..  He had a CT scan back in January showing stable posttreatment changes in the right lung no findings suggest recurrent disease or metastatic disease.  He also had a decrease in size of the previously characterized irregular lobulated nodule in the left upper lobe.  He has another CT scan scheduled for April. ? ?COMPLICATIONS OF TREATMENT: none ? ?FOLLOW UP COMPLIANCE: keeps appointments  ? ?PHYSICAL EXAM:  ?BP (!) 144/87   Pulse 65   Temp (!) 96.5 ?F (35.8 ?C)   Resp 18   Ht 6' (1.829 m)   Wt 220 lb 6.4 oz (100 kg)   BMI 29.89 kg/m?  ?Well-developed well-nourished patient in NAD. HEENT reveals PERLA, EOMI, discs not visualized.  Oral cavity is clear. No oral mucosal lesions are identified. Neck is clear without evidence of cervical or supraclavicular adenopathy. Lungs are clear to A&P. Cardiac examination is essentially unremarkable with regular rate and rhythm without murmur rub or thrill. Abdomen is benign with no organomegaly or masses noted. Motor sensory and DTR levels are equal and symmetric in the upper and lower extremities. Cranial nerves II through XII are grossly intact. Proprioception is intact. No peripheral adenopathy or edema is identified. No motor or sensory levels are noted. Crude visual fields are  within normal range. ? ?RADIOLOGY RESULTS: CT scans reviewed compatible with above-stated findings ? ?PLAN: At the present time patient is doing well very low side effect profile from his SBRT or prior radiation therapy for locally advanced adenocarcinoma of his lung.  I have asked to see him back in 6 months for follow-up with repeat CT scan at that time if it has not already been performed.  Patient comprehends my recommendations well. ? ?I would like to take this opportunity to thank you for allowing me to participate in the care of your patient.. ?  ? [name], MD ? ?

## 2021-09-21 ENCOUNTER — Telehealth: Payer: Self-pay

## 2021-09-21 NOTE — Telephone Encounter (Signed)
-----   Message from Billey Co, MD sent at 09/21/2021  7:46 AM EDT ----- ?Good news, PSA decreased. Keep follow up as scheduled with PSA reflex to free prior ? ?[name], MD ?09/21/2021 ? ? ?

## 2021-09-21 NOTE — Telephone Encounter (Signed)
Called pt no answer. Unable to leave message as no voicemail is set up. 1st attempt.  ?

## 2021-09-22 DIAGNOSIS — N3281 Overactive bladder: Secondary | ICD-10-CM | POA: Insufficient documentation

## 2021-09-22 NOTE — Telephone Encounter (Signed)
Called pt no answer. Unable to leave message as no voicemail is set up. 2nd attempt.  ?

## 2021-09-25 NOTE — Telephone Encounter (Signed)
Called pt no answer. Unable to leave message as no voicemail is set up. 3rd attempt.  ?

## 2021-10-05 ENCOUNTER — Inpatient Hospital Stay: Payer: Medicare Other | Attending: Oncology

## 2021-10-05 DIAGNOSIS — Z923 Personal history of irradiation: Secondary | ICD-10-CM | POA: Insufficient documentation

## 2021-10-05 DIAGNOSIS — Z79899 Other long term (current) drug therapy: Secondary | ICD-10-CM | POA: Diagnosis not present

## 2021-10-05 DIAGNOSIS — Z85118 Personal history of other malignant neoplasm of bronchus and lung: Secondary | ICD-10-CM | POA: Insufficient documentation

## 2021-10-05 DIAGNOSIS — Z9221 Personal history of antineoplastic chemotherapy: Secondary | ICD-10-CM | POA: Diagnosis not present

## 2021-10-05 DIAGNOSIS — E876 Hypokalemia: Secondary | ICD-10-CM | POA: Diagnosis not present

## 2021-10-05 DIAGNOSIS — C3491 Malignant neoplasm of unspecified part of right bronchus or lung: Secondary | ICD-10-CM

## 2021-10-05 DIAGNOSIS — Z452 Encounter for adjustment and management of vascular access device: Secondary | ICD-10-CM | POA: Diagnosis present

## 2021-10-05 DIAGNOSIS — Z95828 Presence of other vascular implants and grafts: Secondary | ICD-10-CM

## 2021-10-05 LAB — COMPREHENSIVE METABOLIC PANEL
ALT: 18 U/L (ref 0–44)
AST: 20 U/L (ref 15–41)
Albumin: 4.1 g/dL (ref 3.5–5.0)
Alkaline Phosphatase: 55 U/L (ref 38–126)
Anion gap: 7 (ref 5–15)
BUN: 10 mg/dL (ref 8–23)
CO2: 28 mmol/L (ref 22–32)
Calcium: 8.8 mg/dL — ABNORMAL LOW (ref 8.9–10.3)
Chloride: 103 mmol/L (ref 98–111)
Creatinine, Ser: 0.85 mg/dL (ref 0.61–1.24)
GFR, Estimated: >60 mL/min (ref >60)
Glucose, Bld: 114 mg/dL — ABNORMAL HIGH (ref 70–99)
Potassium: 3.5 mmol/L (ref 3.5–5.1)
Sodium: 138 mmol/L (ref 135–145)
Total Bilirubin: 0.7 mg/dL (ref 0.3–1.2)
Total Protein: 7.5 g/dL (ref 6.5–8.1)

## 2021-10-05 LAB — CBC WITH DIFFERENTIAL/PLATELET
Abs Immature Granulocytes: 0.02 10*3/uL (ref 0.00–0.07)
Basophils Absolute: 0 10*3/uL (ref 0.0–0.1)
Basophils Relative: 0 %
Eosinophils Absolute: 0.2 10*3/uL (ref 0.0–0.5)
Eosinophils Relative: 3 %
HCT: 44.5 % (ref 39.0–52.0)
Hemoglobin: 15.1 g/dL (ref 13.0–17.0)
Immature Granulocytes: 0 %
Lymphocytes Relative: 13 %
Lymphs Abs: 0.9 10*3/uL (ref 0.7–4.0)
MCH: 28.9 pg (ref 26.0–34.0)
MCHC: 33.9 g/dL (ref 30.0–36.0)
MCV: 85.2 fL (ref 80.0–100.0)
Monocytes Absolute: 0.8 10*3/uL (ref 0.1–1.0)
Monocytes Relative: 11 %
Neutro Abs: 5 10*3/uL (ref 1.7–7.7)
Neutrophils Relative %: 73 %
Platelets: 233 10*3/uL (ref 150–400)
RBC: 5.22 MIL/uL (ref 4.22–5.81)
RDW: 13.5 % (ref 11.5–15.5)
WBC: 6.9 10*3/uL (ref 4.0–10.5)
nRBC: 0 % (ref 0.0–0.2)

## 2021-10-05 MED ORDER — SODIUM CHLORIDE 0.9% FLUSH
10.0000 mL | Freq: Once | INTRAVENOUS | Status: AC
  Administered 2021-10-05: 10 mL via INTRAVENOUS
  Filled 2021-10-05: qty 10

## 2021-10-05 MED ORDER — HEPARIN SOD (PORK) LOCK FLUSH 100 UNIT/ML IV SOLN
500.0000 [IU] | Freq: Once | INTRAVENOUS | Status: AC
  Administered 2021-10-05: 500 [IU] via INTRAVENOUS
  Filled 2021-10-05: qty 5

## 2021-10-18 ENCOUNTER — Ambulatory Visit
Admission: RE | Admit: 2021-10-18 | Discharge: 2021-10-18 | Disposition: A | Discharge status: Home / Self Care | Payer: Medicare Other | Source: Ambulatory Visit | Attending: Oncology | Admitting: Oncology

## 2021-10-18 DIAGNOSIS — C3491 Malignant neoplasm of unspecified part of right bronchus or lung: Secondary | ICD-10-CM | POA: Insufficient documentation

## 2021-10-18 MED ORDER — IOHEXOL 300 MG/ML  SOLN
75.0000 mL | Freq: Once | INTRAMUSCULAR | Status: AC | PRN
  Administered 2021-10-18: 75 mL via INTRAVENOUS

## 2021-10-20 ENCOUNTER — Inpatient Hospital Stay (HOSPITAL_BASED_OUTPATIENT_CLINIC_OR_DEPARTMENT_OTHER): Payer: Medicare Other | Admitting: Oncology

## 2021-10-20 ENCOUNTER — Ambulatory Visit: Payer: Medicare Other | Attending: Family Medicine

## 2021-10-20 ENCOUNTER — Encounter: Payer: Self-pay | Admitting: Oncology

## 2021-10-20 VITALS — BP 146/85 | HR 70 | Temp 96.8°F | Resp 18 | Wt 221.8 lb

## 2021-10-20 DIAGNOSIS — Z95828 Presence of other vascular implants and grafts: Secondary | ICD-10-CM

## 2021-10-20 DIAGNOSIS — E876 Hypokalemia: Secondary | ICD-10-CM

## 2021-10-20 DIAGNOSIS — R911 Solitary pulmonary nodule: Secondary | ICD-10-CM | POA: Diagnosis not present

## 2021-10-20 DIAGNOSIS — R2689 Other abnormalities of gait and mobility: Secondary | ICD-10-CM | POA: Insufficient documentation

## 2021-10-20 DIAGNOSIS — R269 Unspecified abnormalities of gait and mobility: Secondary | ICD-10-CM | POA: Insufficient documentation

## 2021-10-20 DIAGNOSIS — C3491 Malignant neoplasm of unspecified part of right bronchus or lung: Secondary | ICD-10-CM

## 2021-10-20 DIAGNOSIS — Z452 Encounter for adjustment and management of vascular access device: Secondary | ICD-10-CM | POA: Diagnosis not present

## 2021-10-20 NOTE — Therapy (Signed)
?OUTPATIENT PHYSICAL THERAPY NEURO EVALUATION ? ? ?Patient Name: Mark Mccarty ?MRN: [id number] ?DOB:[birth year], 71 y.o., male ?Today's Date: 10/20/2021 ? ?PCP: [name], MD ?REFERRING PROVIDER: Meeler, Sherren Kerns, FNP ? ? PT End of Session - 10/20/21 1200   ? ? Visit Number 1   ? Number of Visits 13   ? Date for PT Re-Evaluation 12/01/21   ? PT Start Time 1014   ? PT Stop Time 1102   ? PT Time Calculation (min) 48 min   ? Equipment Utilized During Treatment Gait belt   ? Activity Tolerance Patient tolerated treatment well   ? Behavior During Therapy [facility] for tasks assessed/performed   ? ?  ?  ? ?  ? ? ?Past Medical History:  ?Diagnosis Date  ? Aortic insufficiency   ? Asthma   ? BPH (benign prostatic hyperplasia)   ? Cancer [facility])   ? lung  ? Cataract   ? left cataract surgery  ? Centrilobular emphysema (Mark Mccarty)   ? Centrilobular emphysema (Mark Mccarty)   ? Colon adenomas   ? Diverticulosis   ? Duodenitis   ? Dysphagia   ? Dyspnea   ? [name]   ? ED (erectile dysfunction)   ? Gastritis   ? GERD (gastroesophageal reflux disease)   ? [name]   ? Hyperlipidemia   ? Hypertension   ? Hyperuricemia   ? Left ventricular diastolic dysfunction   ? Neuromuscular disorder ([place])   ? pt has knees bilateral with tendon issues that cause pain  ? [name] (peptic ulcer disease)   ? [name]   ? ?Past Surgical History:  ?Procedure Laterality Date  ? CARDIAC CATHETERIZATION N/A 12/16/2014  ? Procedure: Left Heart Cath;  Surgeon: [name], MD;  Location: [facility] [facility];  Service: Cardiovascular;  Laterality: N/A;  ? COLONOSCOPY    ? COLONOSCOPY WITH PROPOFOL N/A 01/14/2020  ? Procedure: COLONOSCOPY WITH PROPOFOL;  Surgeon: Toledo, Benay Pike, MD;  Location: [facility];  Service: Gastroenterology;  Laterality: N/A;  ? CYST REMOVAL TRUNK    ? chest and back over time and it was removed  ? CYSTECTOMY    ? ENDOBRONCHIAL ULTRASOUND N/A 12/09/2017  ? Procedure: ENDOBRONCHIAL ULTRASOUND;  Surgeon:  [name], MD;  Location: [name];  Service: Pulmonary;  Laterality: N/A;  ? EYE SURGERY    ? left cataract surgery    ? PORTA CATH INSERTION N/A 12/23/2017  ? Procedure: PORTA CATH INSERTION;  Surgeon: [name], MD;  Location: [place] [facility];  Service: Cardiovascular;  Laterality: N/A;  ? UPPER GI ENDOSCOPY    ? ?Patient Active Problem List  ? Diagnosis Date Noted  ? Port-A-Cath in place 03/15/2020  ? Liver lesion 03/15/2020  ? DDD (degenerative disc disease), lumbar 08/04/2019  ? Obesity (BMI 30.0-34.9) 08/04/2019  ? Umbilical hernia without obstruction and without gangrene 04/07/2019  ? Centrilobular emphysema (Mark Mccarty) 03/18/2019  ? Peripheral polyneuropathy 03/18/2019  ? Chemotherapy-induced neuropathy (Mark Mccarty) 03/18/2019  ? H/O adenomatous polyp of colon 01/20/2019  ? Avascular necrosis of femoral head ([place]) 12/10/2018  ? [name] 09/18/2018  ? Elevated PSA, less than 10 ng/ml 03/22/2018  ? Benign prostatic hyperplasia with urinary obstruction 03/19/2018  ? Benign prostatic hyperplasia with nocturia 03/19/2018  ? Hypokalemia 01/07/2018  ? Goals of care, counseling/discussion 12/13/2017  ? Primary lung adenocarcinoma, right (Mark Mccarty) 12/13/2017  ? SOB (shortness of breath) on exertion 10/02/2017  ? Incomplete immunization status 09/14/2017  ? Pedal edema 12/04/2016  ?  Hyperuricemia 05/02/2016  ? Type 2 diabetes mellitus with other specified complication (Mark Mccarty) 53/61/4431  ? High risk medication use 09/12/2015  ? S/P cardiac catheterization 12/23/2014  ? Chest pain with high risk for cardiac etiology 12/16/2014  ? Abnormal nuclear stress test 12/16/2014  ? Adenomatous polyp of colon 04/02/2014  ? Dysphagia 04/02/2014  ? Family history of colon cancer 03/06/2014  ? Gastroesophageal reflux 03/06/2014  ? Hyperlipidemia associated with type 2 diabetes mellitus (Mark Mccarty) 03/06/2014  ? Hypertension associated with type 2 diabetes mellitus (Mark Mccarty) 03/06/2014  ? Aortic insufficiency 01/20/2014  ?  Erectile dysfunction 01/20/2014  ? Former smoker 01/20/2014  ? Irregular heart beat 01/20/2014  ? Left ventricular diastolic dysfunction 54/00/8676  ? Peptic ulcer disease 01/20/2014  ? ? ?ONSET DATE: Ongoing for ~ 1 year ? ?REFERRING DIAG: Mark Mccarty (ICD-10-CM) - Other abnormalities of gait and mobility  ? ?THERAPY DIAG:  ?Abnormality of gait and mobility ? ?SUBJECTIVE:  ?                                                                                                                                                                                           ? ?SUBJECTIVE STATEMENT: ?Pt referred to OPPT for balance and dizziness. Pt states not so much dizziness but more so balance. Notices on his treadmill at home he needs to hold on so he doesn't fall. Reports "I feel like sometimes when I walk I walk like a drunk" stumbling right, left, and forward. Denies any falls leading up to balance impairments. Does endorse balance issues have occurred after radiation and chemotherapy treatments for his stage 3 lung cancer. Denies decreased sensation to light touch or proprioceptive issues in hands and feet. Denies any falls since having balance difficulties. Pt reports it doesn't matter if it is a flat surface or unstable, pt has consistent balance abnormalities even with decreased light such as during night time.  ? ?Pt accompanied by: self ? ?PERTINENT HISTORY:  ?HPI from referring provider. Listed entire subjective as pt has many comorbidities. ? ?The patient is a pleasant 71 year old gentleman who presents today for follow-up of chronic left-sided low back and buttock pain with radiation to the left lateral foot that occurs when standing for approximately 10 to 15 minutes with associated numbness. He describes his pain as an 8/10 and stabbing, burning and completely alleviated when sitting. He participated in physical therapy in May 2020 which provided mild to moderate benefit. He continues these exercises at home along with  use of nordic tract. Medications include gabapentin 300 mg every morning and 900 mg nightly (with improvement), Tylenol (no improvement). ? ?He is a retired  truck driver. 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 ? ?He is followed by oncology for right adenocarcinoma of the lung and has undergone radiation and chemotherapy. Recent PET scan in 2022. ? ?He is also noted to have incidental bilateral femoral head avascular necrosis. There is mild multilevel degenerative disc disease and facet degenerative changes with mild bilateral subarticular and foraminal stenosis at L4-5. ? ?He is also followed for pain in the right shoulder that has been ongoing for several years and increases with holding his shoulder straight to the side. He also describes a feeling of tingling that radiates to the right triceps, ulnar aspect of the forearm and into digits 3 through 5. He also states that this has been ongoing unsure of start date. Sitting in his recliner increases the tingling in his arm. Resting his arm on the chair can help alleviate this. He has not taken medications for this pain other than gabapentin for which he is prescribed daily. He describes his pain is mild. ? ?He was last evaluated on 08/07/2021 at which time he was experiencing approximately 50% improvement following epidural steroid injection. He was continue with Tylenol twice daily as needed, gabapentin 300 mg twice daily and had discontinued hydrocodone. He was scheduled for another epidural steroid injection. ? ?At today's visit he states he has noted about 50% improvement following epidural steroid injection. He states that for 1 week his pain was increased and then  it improved. Today he states that he is noted that he feels off balance at times over the last year with dizziness for which he states he has discussed with his PCP I recommend discussing this further with hi

## 2021-10-20 NOTE — Progress Notes (Signed)
?Hematology/Oncology Follow up note ? ? ? ?Patient Care Team: ?Mark Kayser, MD as PCP - General (Internal Medicine) ?[name], RN as Equities trader ?[name], MD as Consulting Physician (Oncology) ?[name], MD as Referring Physician (Radiation Oncology) ?[name], MD as Referring Physician (Vascular Surgery)  ? ?REASON FOR VISIT ?Follow up for immunotherapy treatment for stage IIIA lung adenocarcinoma ? ?Oncology History:  ?Mark Mccarty is a  71 y.o.  male with  adenocarcinoma of lung, diagnosed via biopsy of right hilar lymph nodes.  ?Former 43 pack year smoking history.  Presents for assessment prior to concurrent chemo ?cT3 N1 M0 right upper lobe lung adenocarcinoma ?August 2019 Finished  concurrent chemoradiation.   ?03/17/2019 Finished 1 year of durvalumab treatment. ? ?Multiple  liver nodules,  ?07/11/2020 MRI liver with and without contrast showed stable hypervascular lesions in the liver parenchyma without new or progressive findings.  Lesions remain most suggestive of benign etiology.  PET scan did not show evaluation activities. ? ?Left upper lobe nodule ?12/29/2020, discussed on tumor board. ?Slow-growing left upper lobe nodule.  CT-guided biopsy and biopsy via bronchoscopy are both of high risk due to underlying emphysema.  Consensus reached upon continue monitoring and repeat CT in 3 months.  If nodule continues to grow, he may benefit from empiric SBRT ? ?03/13/2021, CT without contrast showed slightly increase of left upper lobe nodule 12x75mm versus 11 x 8 mm.  Slightly increasing size.  Remains suspicious for bronchogenic neoplasm. ?Subtle nodularity along the posterior left upper lobe approximately 6 mm is unchanged.  Posttreatment changes distort the right hilum are unchanged. ? ?04/20/2021 - 04/25/2021 Patient received radiation to left upper lobe x nodule ?04/20/2021 - 04/25/2021 Patient received radiation to left apical nodule. ?07/18/2021, CT chest without contrast showed  stable posttreatment changes within the right lung.  No findings to suggest recurrent tumor or metastatic disease.  Interval decrease in size of previously characterized irregular nodule in the left upper lobe. ? ?INTERVAL HISTORY ?Mark Mccarty is a 71 y.o. male who has above history reviewed by me presents for follow-up of stage III A non-small cell lung cancer. ?Patient reports feeling well.  He has no new complaints today.  He had surveillance CT scan done during interval. ? ? . ?Review of Systems  ?Constitutional:  Negative for appetite change, chills, fatigue, fever and unexpected weight change.  ?[name]:   Negative for hearing loss and voice change.   ?Eyes:  Negative for eye problems and icterus.  ?Respiratory:  Negative for chest tightness, cough and shortness of breath.   ?Cardiovascular:  Negative for chest pain and leg swelling.  ?Gastrointestinal:  Negative for abdominal distention and abdominal pain.  ?Endocrine: Negative for hot flashes.  ?Genitourinary:  Negative for difficulty urinating, dysuria and frequency.   ?Musculoskeletal:  Negative for arthralgias.  ?Skin:  Negative for itching and rash.  ?Neurological:  Negative for headaches, light-headedness and numbness.  ?[name]:  Negative for adenopathy. Does not bruise/bleed easily.  ?Psychiatric/Behavioral:  Negative for confusion.   ? ? ?MEDICAL HISTORY:  ?Past Medical History:  ?Diagnosis Date  ? Aortic insufficiency   ? Asthma   ? BPH (benign prostatic hyperplasia)   ? Cancer [facility])   ? lung  ? Cataract   ? left cataract surgery  ? Centrilobular emphysema ([place])   ? Centrilobular emphysema ([place])   ? Colon adenomas   ? Diverticulosis   ? Duodenitis   ? Dysphagia   ? Dyspnea   ? [name]   ?  ED (erectile dysfunction)   ? Gastritis   ? GERD (gastroesophageal reflux disease)   ? [name]   ? Hyperlipidemia   ? Hypertension   ? Hyperuricemia   ? Left ventricular diastolic dysfunction   ? Neuromuscular disorder ([place])   ?  pt has knees bilateral with tendon issues that cause pain  ? [name] (peptic ulcer disease)   ? [name]   ? ? ?SURGICAL HISTORY: ?Past Surgical History:  ?Procedure Laterality Date  ? CARDIAC CATHETERIZATION N/A 12/16/2014  ? Procedure: Left Heart Cath;  Surgeon: [name], MD;  Location: [place] [facility];  Service: Cardiovascular;  Laterality: N/A;  ? COLONOSCOPY    ? COLONOSCOPY WITH PROPOFOL N/A 01/14/2020  ? Procedure: COLONOSCOPY WITH PROPOFOL;  Surgeon: Toledo, Benay Pike, MD;  Location: [facility];  Service: Gastroenterology;  Laterality: N/A;  ? CYST REMOVAL TRUNK    ? chest and back over time and it was removed  ? CYSTECTOMY    ? ENDOBRONCHIAL ULTRASOUND N/A 12/09/2017  ? Procedure: ENDOBRONCHIAL ULTRASOUND;  Surgeon: [name], MD;  Location: [name];  Service: Pulmonary;  Laterality: N/A;  ? EYE SURGERY    ? left cataract surgery    ? PORTA CATH INSERTION N/A 12/23/2017  ? Procedure: PORTA CATH INSERTION;  Surgeon: [name], MD;  Location: [place] [facility];  Service: Cardiovascular;  Laterality: N/A;  ? UPPER GI ENDOSCOPY    ? ? ?SOCIAL HISTORY: ?Social History  ? ?Socioeconomic History  ? Marital status: Divorced  ?  Spouse name: Not on file  ? Number of children: Not on file  ? Years of education: Not on file  ? Highest education level: Not on file  ?Occupational History  ? Not on file  ?Tobacco Use  ? Smoking status: Former  ?  Packs/day: 1.50  ?  Years: 35.00  ?  Pack years: 52.50  ?  Types: Cigarettes  ?  Quit date: 06/22/2015  ?  Years since quitting: 6.3  ?  Passive exposure: Past  ? Smokeless tobacco: Never  ?Vaping Use  ? Vaping Use: Never used  ?Substance and Sexual Activity  ? Alcohol use: Yes  ?  Comment: weekends  ? Drug use: No  ? Sexual activity: Not Currently  ?Other Topics Concern  ? Not on file  ?Social History Narrative  ? Not on file  ? ?Social Determinants of Health  ? ?Financial Resource Strain: Not on file  ?Food Insecurity: Not on file   ?Transportation Needs: Not on file  ?Physical Activity: Not on file  ?Stress: Not on file  ?Social Connections: Not on file  ?Intimate Partner Violence: Not on file  ? ? ?FAMILY HISTORY: ?Family History  ?Problem Relation Age of Onset  ? Hypertension Mother   ? Breast cancer Mother   ? Heart attack Mother   ? Lung cancer Father   ? Heart disease Sister   ? Diabetes Sister   ? Colon cancer Sister   ? ? ?ALLERGIES:  is allergic to penicillins. ? ?MEDICATIONS:  ?Current Outpatient Medications  ?Medication Sig Dispense Refill  ? acetaminophen (TYLENOL) 500 MG tablet Take 500 mg by mouth as needed.    ? amLODipine (NORVASC) 10 MG tablet Take 10 mg by mouth daily.    ? ascorbic acid (VITAMIN C) 1000 MG tablet Take by mouth.    ? aspirin 81 MG tablet Take 81 mg by mouth daily.    ? carvedilol (COREG) 25 MG tablet Take  25 mg by mouth 2 (two) times daily with a meal.    ? Cholecalciferol 50 MCG (2000 UT) TABS Take by mouth.    ? gabapentin (NEURONTIN) 300 MG capsule Take 300 mg by mouth at bedtime as needed.     ? HYDROcodone-acetaminophen (NORCO/VICODIN) 5-325 MG tablet Take one tablet at night for pain; may take up to every 8 hours as needed for pain if not working or driving    ? ibuprofen (ADVIL,MOTRIN) 600 MG tablet Take 600 mg by mouth every 6 (six) hours as needed.    ? lidocaine-prilocaine (EMLA) cream Apply to port area once 30 g 3  ? lisinopril (PRINIVIL,ZESTRIL) 40 MG tablet Take 40 mg by mouth daily.    ? potassium chloride (KLOR-CON) 10 MEQ tablet TAKE 1 TABLET BY MOUTH EVERY DAY 90 tablet 1  ? pravastatin (PRAVACHOL) 20 MG tablet Take 20 mg by mouth at bedtime.    ? prochlorperazine (COMPAZINE) 10 MG tablet Take 1 tablet (10 mg total) by mouth every 6 (six) hours as needed (Nausea or vomiting). 30 tablet 1  ? senna (SENOKOT) 8.6 MG TABS tablet Take 2 tablets (17.2 mg total) by mouth daily. 120 each 0  ? sildenafil (VIAGRA) 50 MG tablet Take 1-2 tablets (50-100 mg total) by mouth daily as needed for erectile  dysfunction. 30 tablet 6  ? solifenacin (VESICARE) 10 MG tablet Take 1 tablet (10 mg total) by mouth daily. 30 tablet 6  ? tamsulosin (FLOMAX) 0.4 MG CAPS capsule Take 0.4 mg by mouth daily.    ? zolpidem (A

## 2021-10-24 NOTE — Therapy (Incomplete)
?OUTPATIENT PHYSICAL THERAPY NEURO TREATMENT ? ? ?Patient Name: Mark Mccarty ?MRN: [id number] ?DOB:February 09, 1951, 71 y.o., male ?Today's Date: 10/24/2021 ? ?PCP: [name], MD ?REFERRING PROVIDER: Meeler, Sherren Kerns, FNP ? ? ? ? ?Past Medical History:  ?Diagnosis Date  ? Aortic insufficiency   ? Asthma   ? BPH (benign prostatic hyperplasia)   ? Cancer [facility])   ? lung  ? Cataract   ? left cataract surgery  ? Centrilobular emphysema ([place])   ? Centrilobular emphysema ([place])   ? Colon adenomas   ? Diverticulosis   ? Duodenitis   ? Dysphagia   ? Dyspnea   ? [name]   ? ED (erectile dysfunction)   ? Gastritis   ? GERD (gastroesophageal reflux disease)   ? [name]   ? Hyperlipidemia   ? Hypertension   ? Hyperuricemia   ? Left ventricular diastolic dysfunction   ? Neuromuscular disorder ([place])   ? pt has knees bilateral with tendon issues that cause pain  ? [name] (peptic ulcer disease)   ? [name]   ? ?Past Surgical History:  ?Procedure Laterality Date  ? CARDIAC CATHETERIZATION N/A 12/16/2014  ? Procedure: Left Heart Cath;  Surgeon: [name], MD;  Location: [place] [facility];  Service: Cardiovascular;  Laterality: N/A;  ? COLONOSCOPY    ? COLONOSCOPY WITH PROPOFOL N/A 01/14/2020  ? Procedure: COLONOSCOPY WITH PROPOFOL;  Surgeon: Toledo, Benay Pike, MD;  Location: [facility] ENDOSCOPY;  Service: Gastroenterology;  Laterality: N/A;  ? CYST REMOVAL TRUNK    ? chest and back over time and it was removed  ? CYSTECTOMY    ? ENDOBRONCHIAL ULTRASOUND N/A 12/09/2017  ? Procedure: ENDOBRONCHIAL ULTRASOUND;  Surgeon: [name], MD;  Location: [name];  Service: Pulmonary;  Laterality: N/A;  ? EYE SURGERY    ? left cataract surgery    ? PORTA CATH INSERTION N/A 12/23/2017  ? Procedure: PORTA CATH INSERTION;  Surgeon: [name], MD;  Location: [place] [facility];  Service: Cardiovascular;  Laterality: N/A;  ? UPPER GI ENDOSCOPY    ? ?Patient Active Problem List  ? Diagnosis Date  Noted  ? Port-A-Cath in place 03/15/2020  ? Liver lesion 03/15/2020  ? DDD (degenerative disc disease), lumbar 08/04/2019  ? Obesity (BMI 30.0-34.9) 08/04/2019  ? Umbilical hernia without obstruction and without gangrene 04/07/2019  ? Centrilobular emphysema ([place]) 03/18/2019  ? Peripheral polyneuropathy 03/18/2019  ? Chemotherapy-induced neuropathy ([place]) 03/18/2019  ? H/O adenomatous polyp of colon 01/20/2019  ? Avascular necrosis of femoral head ([place]) 12/10/2018  ? [name] 09/18/2018  ? Elevated PSA, less than 10 ng/ml 03/22/2018  ? Benign prostatic hyperplasia with urinary obstruction 03/19/2018  ? Benign prostatic hyperplasia with nocturia 03/19/2018  ? Hypokalemia 01/07/2018  ? Goals of care, counseling/discussion 12/13/2017  ? Primary lung adenocarcinoma, right ([place]) 12/13/2017  ? SOB (shortness of breath) on exertion 10/02/2017  ? Incomplete immunization status 09/14/2017  ? Pedal edema 12/04/2016  ? Hyperuricemia 05/02/2016  ? Type 2 diabetes mellitus with other specified complication ([place]) 37/16/9678  ? High risk medication use 09/12/2015  ? S/P cardiac catheterization 12/23/2014  ? Chest pain with high risk for cardiac etiology 12/16/2014  ? Abnormal nuclear stress test 12/16/2014  ? Adenomatous polyp of colon 04/02/2014  ? Dysphagia 04/02/2014  ? Family history of colon cancer 03/06/2014  ? Gastroesophageal reflux 03/06/2014  ? Hyperlipidemia associated with type 2 diabetes mellitus ([place]) 03/06/2014  ? Hypertension associated with type 2 diabetes mellitus (  [place]) 03/06/2014  ? Aortic insufficiency 01/20/2014  ? Erectile dysfunction 01/20/2014  ? Former smoker 01/20/2014  ? Irregular heart beat 01/20/2014  ? Left ventricular diastolic dysfunction 56/31/4970  ? Peptic ulcer disease 01/20/2014  ? ? ?ONSET DATE: Ongoing for ~ 1 year ? ?REFERRING DIAG: R26.89 (ICD-10-CM) - Other abnormalities of gait and mobility  ? ?THERAPY DIAG:  ?Abnormality of gait and mobility ? ? ?From initial evaluation   ? ?SUBJECTIVE:  ?                                                                                                                                                                                           ?SUBJECTIVE STATEMENT: ?Pt referred to OPPT for balance and dizziness. Pt states not so much dizziness but more so balance. Notices on his treadmill at home he needs to hold on so he doesn't fall. Reports "I feel like sometimes when I walk I walk like a drunk" stumbling right, left, and forward. Denies any falls leading up to balance impairments. Does endorse balance issues have occurred after radiation and chemotherapy treatments for his stage 3 lung cancer. Denies decreased sensation to light touch or proprioceptive issues in hands and feet. Denies any falls since having balance difficulties. Pt reports it doesn't matter if it is a flat surface or unstable, pt has consistent balance abnormalities even with decreased light such as during night time.  ? ?Pt accompanied by: self ? ?PERTINENT HISTORY:  ?HPI from referring provider. Listed entire subjective as pt has many comorbidities. ? ?The patient is a pleasant 71 year old gentleman who presents today for follow-up of chronic left-sided low back and buttock pain with radiation to the left lateral foot that occurs when standing for approximately 10 to 15 minutes with associated numbness. He describes his pain as an 8/10 and stabbing, burning and completely alleviated when sitting. He participated in physical therapy in May 2020 which provided mild to moderate benefit. He continues these exercises at home along with use of nordic tract. Medications include gabapentin 300 mg every morning and 900 mg nightly (with improvement), Tylenol (no improvement). ? ?He is a retired Administrator. 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 ? ?He is followed by oncology for right adenocarcinoma of the lung and has undergone radiation and chemotherapy. Recent PET scan in 2022. ? ?He is  also noted to have incidental bilateral femoral head avascular necrosis. There is mild multilevel degenerative disc disease and facet degenerative changes with mild bilateral subarticular and foraminal stenosis at L4-5. ? ?He is also followed for pain in the right shoulder that has been ongoing for several years and increases with holding his shoulder straight to the side. He also describes a feeling of tingling that radiates to the right triceps, ulnar aspect of the forearm and into digits 3 through 5. He also states that this has been ongoing unsure of start date. Sitting in his recliner increases the tingling in his arm. Resting his arm on the chair can help alleviate this. He has not taken medications for this pain other than gabapentin for which he is prescribed daily. He describes his pain is mild. ? ?He was last evaluated on 08/07/2021 at which time he was experiencing approximately 50% improvement following epidural steroid injection. He was continue with Tylenol twice daily as needed, gabapentin 300 mg twice daily and had discontinued hydrocodone. He was scheduled for another epidural steroid injection. ? ?At today's visit he states he has noted about 50% improvement following epidural steroid injection. He states that for 1 week his pain was increased and then it improved. Today he states that he is noted that he feels off balance at times over the last year with dizziness for which he states he has discussed with his PCP I recommend discussing this further with him and we also discussed balance therapy at the hospital for which she would like to move forward with. On exam today he has full strength in the  upper and lower extremities no concerning signs of myelopathy. He denies any numbness or tingling. ? ?He was evaluated by Reche Dixon for further evaluation of possible avascular necrosis noted on MRI.  ? ?PRECA

## 2021-10-25 ENCOUNTER — Ambulatory Visit: Payer: Medicare Other

## 2021-10-26 DIAGNOSIS — R911 Solitary pulmonary nodule: Secondary | ICD-10-CM

## 2021-10-26 DIAGNOSIS — C3491 Malignant neoplasm of unspecified part of right bronchus or lung: Secondary | ICD-10-CM

## 2021-11-30 ENCOUNTER — Inpatient Hospital Stay: Payer: Medicare Other | Attending: Oncology

## 2021-11-30 DIAGNOSIS — Z95828 Presence of other vascular implants and grafts: Secondary | ICD-10-CM

## 2021-11-30 DIAGNOSIS — Z85118 Personal history of other malignant neoplasm of bronchus and lung: Secondary | ICD-10-CM | POA: Insufficient documentation

## 2021-11-30 DIAGNOSIS — Z452 Encounter for adjustment and management of vascular access device: Secondary | ICD-10-CM | POA: Insufficient documentation

## 2021-11-30 MED ORDER — SODIUM CHLORIDE 0.9% FLUSH
10.0000 mL | Freq: Once | INTRAVENOUS | Status: AC
  Administered 2021-11-30: 10 mL via INTRAVENOUS
  Filled 2021-11-30: qty 10

## 2021-11-30 MED ORDER — HEPARIN SOD (PORK) LOCK FLUSH 100 UNIT/ML IV SOLN
500.0000 [IU] | Freq: Once | INTRAVENOUS | Status: AC
  Administered 2021-11-30: 500 [IU] via INTRAVENOUS
  Filled 2021-11-30: qty 5

## 2021-12-11 ENCOUNTER — Other Ambulatory Visit: Payer: Self-pay | Admitting: Nurse Practitioner

## 2022-01-17 ENCOUNTER — Ambulatory Visit
Admission: RE | Admit: 2022-01-17 | Discharge: 2022-01-17 | Disposition: A | Discharge status: Home / Self Care | Payer: Medicare Other | Source: Ambulatory Visit | Attending: Oncology | Admitting: Oncology

## 2022-01-17 DIAGNOSIS — C3491 Malignant neoplasm of unspecified part of right bronchus or lung: Secondary | ICD-10-CM | POA: Diagnosis present

## 2022-01-19 ENCOUNTER — Other Ambulatory Visit: Payer: Medicare Other

## 2022-01-22 ENCOUNTER — Other Ambulatory Visit: Payer: Self-pay

## 2022-01-22 DIAGNOSIS — C3491 Malignant neoplasm of unspecified part of right bronchus or lung: Secondary | ICD-10-CM

## 2022-01-23 ENCOUNTER — Inpatient Hospital Stay: Payer: Medicare Other | Attending: Oncology

## 2022-01-23 ENCOUNTER — Inpatient Hospital Stay (HOSPITAL_BASED_OUTPATIENT_CLINIC_OR_DEPARTMENT_OTHER): Payer: Medicare Other | Admitting: Oncology

## 2022-01-23 ENCOUNTER — Encounter: Payer: Self-pay | Admitting: Oncology

## 2022-01-23 VITALS — BP 138/79 | HR 66 | Temp 96.8°F | Wt 221.3 lb

## 2022-01-23 DIAGNOSIS — Z85118 Personal history of other malignant neoplasm of bronchus and lung: Secondary | ICD-10-CM | POA: Insufficient documentation

## 2022-01-23 DIAGNOSIS — C3491 Malignant neoplasm of unspecified part of right bronchus or lung: Secondary | ICD-10-CM | POA: Diagnosis not present

## 2022-01-23 DIAGNOSIS — R911 Solitary pulmonary nodule: Secondary | ICD-10-CM | POA: Diagnosis not present

## 2022-01-23 DIAGNOSIS — Z452 Encounter for adjustment and management of vascular access device: Secondary | ICD-10-CM | POA: Insufficient documentation

## 2022-01-23 DIAGNOSIS — Z79899 Other long term (current) drug therapy: Secondary | ICD-10-CM | POA: Diagnosis not present

## 2022-01-23 DIAGNOSIS — Z923 Personal history of irradiation: Secondary | ICD-10-CM | POA: Insufficient documentation

## 2022-01-23 DIAGNOSIS — Z9221 Personal history of antineoplastic chemotherapy: Secondary | ICD-10-CM | POA: Insufficient documentation

## 2022-01-23 DIAGNOSIS — Z95828 Presence of other vascular implants and grafts: Secondary | ICD-10-CM

## 2022-01-23 DIAGNOSIS — E876 Hypokalemia: Secondary | ICD-10-CM | POA: Insufficient documentation

## 2022-01-23 DIAGNOSIS — Z87891 Personal history of nicotine dependence: Secondary | ICD-10-CM | POA: Insufficient documentation

## 2022-01-23 LAB — CBC WITH DIFFERENTIAL/PLATELET
Abs Immature Granulocytes: 0.02 10*3/uL (ref 0.00–0.07)
Basophils Absolute: 0 10*3/uL (ref 0.0–0.1)
Basophils Relative: 0 %
Eosinophils Absolute: 0.2 10*3/uL (ref 0.0–0.5)
Eosinophils Relative: 3 %
HCT: 43.3 % (ref 39.0–52.0)
Hemoglobin: 14.9 g/dL (ref 13.0–17.0)
Immature Granulocytes: 0 %
Lymphocytes Relative: 16 %
Lymphs Abs: 1.1 10*3/uL (ref 0.7–4.0)
MCH: 29.5 pg (ref 26.0–34.0)
MCHC: 34.4 g/dL (ref 30.0–36.0)
MCV: 85.7 fL (ref 80.0–100.0)
Monocytes Absolute: 0.6 10*3/uL (ref 0.1–1.0)
Monocytes Relative: 9 %
Neutro Abs: 5.1 10*3/uL (ref 1.7–7.7)
Neutrophils Relative %: 72 %
Platelets: 239 10*3/uL (ref 150–400)
RBC: 5.05 MIL/uL (ref 4.22–5.81)
RDW: 13.7 % (ref 11.5–15.5)
WBC: 7.1 10*3/uL (ref 4.0–10.5)
nRBC: 0 % (ref 0.0–0.2)

## 2022-01-23 LAB — COMPREHENSIVE METABOLIC PANEL
ALT: 16 U/L (ref 0–44)
AST: 19 U/L (ref 15–41)
Albumin: 4.1 g/dL (ref 3.5–5.0)
Alkaline Phosphatase: 52 U/L (ref 38–126)
Anion gap: 6 (ref 5–15)
BUN: 18 mg/dL (ref 8–23)
CO2: 29 mmol/L (ref 22–32)
Calcium: 9 mg/dL (ref 8.9–10.3)
Chloride: 105 mmol/L (ref 98–111)
Creatinine, Ser: 0.84 mg/dL (ref 0.61–1.24)
GFR, Estimated: >60 mL/min (ref >60)
Glucose, Bld: 106 mg/dL — ABNORMAL HIGH (ref 70–99)
Potassium: 3.4 mmol/L — ABNORMAL LOW (ref 3.5–5.1)
Sodium: 140 mmol/L (ref 135–145)
Total Bilirubin: 0.4 mg/dL (ref 0.3–1.2)
Total Protein: 7.4 g/dL (ref 6.5–8.1)

## 2022-01-23 MED ORDER — HEPARIN SOD (PORK) LOCK FLUSH 100 UNIT/ML IV SOLN
500.0000 [IU] | Freq: Once | INTRAVENOUS | Status: AC
  Administered 2022-01-23: 500 [IU] via INTRAVENOUS
  Filled 2022-01-23: qty 5

## 2022-01-23 MED ORDER — SODIUM CHLORIDE 0.9% FLUSH
10.0000 mL | INTRAVENOUS | Status: DC | PRN
  Administered 2022-01-23: 10 mL via INTRAVENOUS
  Filled 2022-01-23: qty 10

## 2022-01-23 MED ORDER — POTASSIUM CHLORIDE ER 10 MEQ PO TBCR: 10.0000 meq | EXTENDED_RELEASE_TABLET | Freq: Every day | ORAL | 1 refills | Status: DC

## 2022-01-23 NOTE — Progress Notes (Signed)
Hematology/Oncology Progress note [phone] [phone]       Patient Care Team: [name], MD as PCP - General (Internal Medicine) [name], RN as Registered Nurse [name], MD as Consulting Physician (Oncology) [name], MD as Referring Physician (Radiation Oncology) [name], MD as Referring Physician (Vascular Surgery)   REASON FOR VISIT Follow up for immunotherapy treatment for stage IIIA right lung adenocarcinoma, and presumed stage I left lung cancer  Oncology History:  Mark Mccarty is a  71 y.o.  male with  adenocarcinoma of lung, diagnosed via biopsy of right hilar lymph nodes.  Former 43 pack year smoking history.  Presents for assessment prior to concurrent chemo cT3 N1 M0 right upper lobe lung adenocarcinoma August 2019 Finished  concurrent chemoradiation.   03/17/2019 Finished 1 year of durvalumab treatment.  Multiple  liver nodules,  07/11/2020 MRI liver with and without contrast showed stable hypervascular lesions in the liver parenchyma without new or progressive findings.  Lesions remain most suggestive of benign etiology.  PET scan did not show evaluation activities.  Left upper lobe nodule 12/29/2020, discussed on tumor board. Slow-growing left upper lobe nodule.  CT-guided biopsy and biopsy via bronchoscopy are both of high risk due to underlying emphysema.  Consensus reached upon continue monitoring and repeat CT in 3 months.  If nodule continues to grow, he may benefit from empiric SBRT  03/13/2021, CT without contrast showed slightly increase of left upper lobe nodule 12x67mm versus 11 x 8 mm.  Slightly increasing size.  Remains suspicious for bronchogenic neoplasm. Subtle nodularity along the posterior left upper lobe approximately 6 mm is unchanged.  Posttreatment changes distort the right hilum are unchanged.  04/20/2021 - 04/25/2021 Patient received radiation to left upper lobe x nodule 04/20/2021 - 04/25/2021  Patient received radiation to left apical nodule. 07/18/2021, CT chest without contrast showed stable posttreatment changes within the right lung.  No findings to suggest recurrent tumor or metastatic disease.  Interval decrease in size of previously characterized irregular nodule in the left upper lobe.  10/18/2021, CT chest showed a stable examination of chronic postradiation masslike fibrosis in the right lung. Persistent subsolid nodule in the superior segment of the left lower lobe 1.3 x 1.2 cm.  Nonspecific.  COPD findings, aortic atherosclerosis.  Coronary artery disease.  INTERVAL HISTORY Mark Mccarty is a 71 y.o. male who has above history reviewed by me presents for follow-up of stage III A non-small cell lung cancer. Patient reports feeling well.  Patient has no new complaints Chronic cough, unchanged.  No hemoptysis, shortness of breath.   . Review of Systems  Constitutional:  Negative for appetite change, chills, fatigue, fever and unexpected weight change.  [name]:   Negative for hearing loss and voice change.   Eyes:  Negative for eye problems and icterus.  Respiratory:  Negative for chest tightness, cough and shortness of breath.   Cardiovascular:  Negative for chest pain and leg swelling.  Gastrointestinal:  Negative for abdominal distention and abdominal pain.  Endocrine: Negative for hot flashes.  Genitourinary:  Negative for difficulty urinating, dysuria and frequency.   Musculoskeletal:  Negative for arthralgias.  Skin:  Negative for itching and rash.  Neurological:  Negative for headaches, light-headedness and numbness.  [name]:  Negative for adenopathy. Does not bruise/bleed easily.  Psychiatric/Behavioral:  Negative for confusion.      MEDICAL HISTORY:  Past Medical History:  Diagnosis Date   Aortic insufficiency    Asthma  BPH (benign prostatic hyperplasia)    Cancer (HCC)    lung   Cataract    left cataract surgery   Centrilobular emphysema  (HCC)    Centrilobular emphysema (HCC)    Colon adenomas    Diverticulosis    Duodenitis    Dysphagia    Dyspnea    [name]    ED (erectile dysfunction)    Gastritis    GERD (gastroesophageal reflux disease)    [name]    Hyperlipidemia    Hypertension    Hyperuricemia    Left ventricular diastolic dysfunction    Neuromuscular disorder (HCC)    pt has knees bilateral with tendon issues that cause pain   [name] (peptic ulcer disease)    [name]     SURGICAL HISTORY: Past Surgical History:  Procedure Laterality Date   CARDIAC CATHETERIZATION N/A 12/16/2014   Procedure: Left Heart Cath;  Surgeon: [name], MD;  Location: [facility];  Service: Cardiovascular;  Laterality: N/A;   COLONOSCOPY     COLONOSCOPY WITH PROPOFOL N/A 01/14/2020   Procedure: COLONOSCOPY WITH PROPOFOL;  Surgeon: Toledo, Benay Pike, MD;  Location: [facility];  Service: Gastroenterology;  Laterality: N/A;   CYST REMOVAL TRUNK     chest and back over time and it was removed   CYSTECTOMY     ENDOBRONCHIAL ULTRASOUND N/A 12/09/2017   Procedure: ENDOBRONCHIAL ULTRASOUND;  Surgeon: [name], MD;  Location: [name];  Service: Pulmonary;  Laterality: N/A;   EYE SURGERY     left cataract surgery     PORTA CATH INSERTION N/A 12/23/2017   Procedure: PORTA CATH INSERTION;  Surgeon: [name], MD;  Location: [facility];  Service: Cardiovascular;  Laterality: N/A;   UPPER GI ENDOSCOPY      SOCIAL HISTORY: Social History   Socioeconomic History   Marital status: Divorced    Spouse name: Not on file   Number of children: Not on file   Years of education: Not on file   Highest education level: Not on file  Occupational History   Not on file  Tobacco Use   Smoking status: Former    Packs/day: 1.50    Years: 35.00    Total pack years: 52.50    Types: Cigarettes    Quit date: 06/22/2015    Years since quitting: 6.5    Passive  exposure: Past   Smokeless tobacco: Never  Vaping Use   Vaping Use: Never used  Substance and Sexual Activity   Alcohol use: Yes    Comment: weekends   Drug use: No   Sexual activity: Not Currently  Other Topics Concern   Not on file  Social History Narrative   Not on file   Social Determinants of Health   Financial Resource Strain: Not on file  Food Insecurity: Not on file  Transportation Needs: Not on file  Physical Activity: Not on file  Stress: Not on file  Social Connections: Not on file  Intimate Partner Violence: Not on file    FAMILY HISTORY: Family History  Problem Relation Age of Onset   Hypertension Mother    Breast cancer Mother    Heart attack Mother    Lung cancer Father    Heart disease Sister    Diabetes Sister    Colon cancer Sister     ALLERGIES:  is allergic to penicillins.  MEDICATIONS:  Current Outpatient Medications  Medication Sig Dispense Refill   acetaminophen (TYLENOL) 500  MG tablet Take 500 mg by mouth as needed.     amLODipine (NORVASC) 10 MG tablet Take 10 mg by mouth daily.     ascorbic acid (VITAMIN C) 1000 MG tablet Take by mouth.     aspirin 81 MG tablet Take 81 mg by mouth daily.     carvedilol (COREG) 25 MG tablet Take 25 mg by mouth 2 (two) times daily with a meal.     Cholecalciferol 50 MCG (2000 UT) TABS Take by mouth.     gabapentin (NEURONTIN) 300 MG capsule Take 300 mg by mouth at bedtime as needed.      HYDROcodone-acetaminophen (NORCO/VICODIN) 5-325 MG tablet Take one tablet at night for pain; may take up to every 8 hours as needed for pain if not working or driving     ibuprofen (ADVIL,MOTRIN) 600 MG tablet Take 600 mg by mouth every 6 (six) hours as needed.     lidocaine-prilocaine (EMLA) cream Apply to port area once 30 g 3   lisinopril (PRINIVIL,ZESTRIL) 40 MG tablet Take 40 mg by mouth daily.     pravastatin (PRAVACHOL) 20 MG tablet Take 20 mg by mouth at bedtime.     senna (SENOKOT) 8.6 MG TABS tablet Take 2  tablets (17.2 mg total) by mouth daily. 120 each 0   sildenafil (VIAGRA) 50 MG tablet Take 1-2 tablets (50-100 mg total) by mouth daily as needed for erectile dysfunction. 30 tablet 6   solifenacin (VESICARE) 10 MG tablet Take 1 tablet (10 mg total) by mouth daily. 30 tablet 6   tamsulosin (FLOMAX) 0.4 MG CAPS capsule Take 0.4 mg by mouth daily.     zolpidem (AMBIEN) 5 MG tablet Take 1 tablet by mouth.     potassium chloride (KLOR-CON) 10 MEQ tablet Take 1 tablet (10 mEq total) by mouth daily. 90 tablet 1   prochlorperazine (COMPAZINE) 10 MG tablet Take 1 tablet (10 mg total) by mouth every 6 (six) hours as needed (Nausea or vomiting). (Patient not taking: Reported on 01/23/2022) 30 tablet 1   No current facility-administered medications for this visit.   Facility-Administered Medications Ordered in Other Visits  Medication Dose Route Frequency Provider Last Rate Last Admin   sodium chloride flush (NS) 0.9 % injection 10 mL  10 mL Intravenous PRN [name], MD   10 mL at 03/18/18 0858     PHYSICAL EXAMINATION: ECOG PERFORMANCE STATUS: 0 - Asymptomatic Vitals:   01/23/22 1327  BP: 138/79  Pulse: 66  Temp: (!) 96.8 F (36 C)  SpO2: 94%   Filed Weights   01/23/22 1327  Weight: 221 lb 4.8 oz (100.4 kg)    Physical Exam Constitutional:      General: He is not in acute distress. [name]:     Head: Normocephalic and atraumatic.  Eyes:     General: No scleral icterus.    Conjunctiva/sclera: Conjunctivae normal.     Pupils: Pupils are equal, round, and reactive to light.  Cardiovascular:     Rate and Rhythm: Normal rate and regular rhythm.     Heart sounds: Normal heart sounds.  Pulmonary:     Effort: Pulmonary effort is normal. No respiratory distress.     Breath sounds: No wheezing or rales.     Comments: Decreased breath sound bilaterally Chest:     Chest wall: No tenderness.  Abdominal:     General: Bowel sounds are normal. There is no distension.     Palpations: Abdomen is  soft. There is no  mass.     Tenderness: There is no abdominal tenderness.  Musculoskeletal:        General: No deformity. Normal range of motion.     Cervical back: Normal range of motion and neck supple.  Lymphadenopathy:     Cervical: No cervical adenopathy.  Skin:    General: Skin is warm and dry.     Findings: No erythema or rash.     Comments: Chest wall Mediport site no erythema or discharge. Left supraclavicular fossa fullness, chronic.  Neurological:     Mental Status: He is alert and oriented to person, place, and time. Mental status is at baseline.     Cranial Nerves: No cranial nerve deficit.     Coordination: Coordination normal.  Psychiatric:        Mood and Affect: Mood normal.      LABORATORY DATA:  I have reviewed the data as listed Lab Results  Component Value Date   WBC 7.1 01/23/2022   HGB 14.9 01/23/2022   HCT 43.3 01/23/2022   MCV 85.7 01/23/2022   PLT 239 01/23/2022   Recent Labs    07/20/21 1038 10/05/21 1053 01/23/22 1309  NA 139 138 140  K 3.4* 3.5 3.4*  CL 104 103 105  CO2 28 28 29   GLUCOSE 120* 114* 106*  BUN 12 10 18   CREATININE 0.76 0.85 0.84  CALCIUM 9.0 8.8* 9.0  GFRNONAA >60 >60 >60  PROT 7.1 7.5 7.4  ALBUMIN 4.2 4.1 4.1  AST 22 20 19   ALT 19 18 16   ALKPHOS 56 55 52  BILITOT 0.3 0.7 0.4    RADIOGRAPHIC STUDIES: I have personally reviewed the radiological images as listed and agreed with the findings in the report..  03/21/2018 CT chest showed mild decreasing size of the right upper lobe pulmonary nodule.  Interval decrease in size of right paratracheal and right hilar adenopathy.  06/30/2018 CT chest showed interval development of extensive changes secondary to external beam radiation within the posterior right upper lobe and superior segment of right lower lobe.  The enlarged right paratracheal lymph node is decreased in size in the interval.  Right upper lobe lung lesion is difficult to separate from surrounding changes due  to radiation but is favored to have decrease in size in the interval.  Aortic atherosclerosis and emphysema.  LAD coronary artery atherosclerotic calcification.  08/06/2018 CT head without contrast Brain parenchymal appears unremarkable there is no mass or hemorrhage.    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 Continued further decrease in the size of previously identified right.  Tracheal lymph node. Aortic atherosclerosis.  Emphysema.  CT Chest Wo Contrast  Result Date: 01/18/2022 CLINICAL DATA:  Lung cancer follow-up, three-month follow-up in a patient on immunotherapy. * Tracking Code: BO * EXAM: CT CHEST WITHOUT CONTRAST TECHNIQUE: Multidetector CT imaging of the chest was performed following the standard protocol without IV contrast. RADIATION DOSE REDUCTION: This exam was performed according to the departmental dose-optimization program which includes automated exposure control, adjustment of the mA and/or kV according to patient size and/or use of iterative reconstruction technique. COMPARISON:  October 22, 2021. FINDINGS: Cardiovascular: LEFT-sided Port-A-Cath terminating at the caval to atrial junction as before. Heart size normal without pericardial effusion or nodularity. Aortic atherosclerosis with calcified plaque. No aortic dilation. Central pulmonary vasculature grossly normal though distortion of the RIGHT hilum following post treatment changes. Limited assessment of cardiovascular  structures given lack of intravenous contrast. Mediastinum/Nodes: No thoracic inlet, axillary or mediastinal lymphadenopathy. Stable scattered subcentimeter lymph nodes in the chest. No hilar adenopathy or mediastinal adenopathy. Esophagus mildly patulous. Lungs/Pleura: Marked pulmonary emphysema in the background. Bullous disease along the medial LEFT upper lobe. Parenchymal scarring in the LEFT upper lobe  at the site of previous nodule. Subsolid area in the superior segment of the LEFT lower lobe (image 61/3) 10 x 11 mm within 1 mm of previous measurement. There is however increasing nodularity along the pleural surface which is contiguous with this area (image 67/3) 14 x 8 mm. Perihilar parenchymal distortion and scarring from prior radiotherapy in the RIGHT hilum extending into RIGHT upper and lower lobe. Associated bronchiectatic changes are unchanged as well. No pleural effusion. Stable small nodule along the major fissure in the LEFT upper lobe (image 55/3) Upper Abdomen: Incidental imaging of upper abdominal contents without acute process. Musculoskeletal: No acute bone finding. No destructive bone process. Spinal degenerative changes. IMPRESSION: 1. Subsolid area in the LEFT lower lobe is similar to prior imaging but with increasing nodularity along the posterior pleural surface contiguous with this area. Findings are suspicious for developing bronchogenic neoplasm. PET imaging may be helpful in this high-risk patient for further assessment. 2. Stable nodule along the fissure in the LEFT chest (image 55/3) 4 mm. This is little changed dating back to September of 2022. 3. Perihilar parenchymal distortion and scarring from prior radiotherapy in the RIGHT hilum extending into RIGHT upper and lower lobe. Associated bronchiectatic changes are unchanged as well. 4. Parenchymal scarring in the LEFT upper lobe at the site of previous small nodule likely reflects post treatment changes. Correlate with any radiotherapy in this area with attention on follow-up. 5. Marked pulmonary emphysema in the background. 6. Aortic atherosclerosis. Aortic Atherosclerosis (ICD10-I70.0) and Emphysema (ICD10-J43.9). Electronically Signed   By: Zetta Bills M.D.   On: 01/18/2022 10:07     ASSESSMENT & PLAN:  1. Primary lung adenocarcinoma, right ([place])   2. Nodule of left lung   3. Port-A-Cath in place   4. Hypokalemia     #History of stage IIIA cT3 N1 M0 lung adenocarcinoma, s/p concurrent chemoradiation [Aug 2019].  Finished 1 year of durvalumab treatments. Presumed left upper lobe lung cancer status post SBRT in October 2022 01/17/2022, CT chest without contrast showed subsolid area in the left lower lobe is similar to prior imaging however with increasing nodularity.  Recommend PET scan for further evaluation Perihilar parenchymal destruction and scarring from prior radiation in the right hilum extending into the right upper and lower lobe unchanged. Left upper lobe parenchymal scarring likely reflects treatment changes.  Marked pulmonary emphysema in the background.  Aortic atherosclerosis.  Findings were discussed with patient and he agrees with obtaining PET scan for further evaluation . Chronic hypokalemia, continue oral potassium chloride 10 mEq daily.  Potassium level is stable.  Port-A-Cath in place, continue port flush every 8 weeks. If PET scan is negative, patient would like to have Mediport removed.   We spent sufficient time to discuss many aspect of care, questions were answered to patient's satisfaction.  Return visit, PET scan.  Follow-up appointment to be determined  depending on PET scan results.  We spent sufficient time to discuss many aspect of care, questions were answered to patient's satisfaction.   [name], MD, PhD

## 2022-01-23 NOTE — Progress Notes (Signed)
Pt here for follow up. Pt would like to discuss port removal.

## 2022-01-25 ENCOUNTER — Inpatient Hospital Stay: Payer: Medicare Other

## 2022-02-05 ENCOUNTER — Encounter
Admission: RE | Admit: 2022-02-05 | Discharge: 2022-02-05 | Disposition: A | Discharge status: Home / Self Care | Payer: Medicare Other | Source: Ambulatory Visit | Attending: Oncology | Admitting: Oncology

## 2022-02-05 DIAGNOSIS — C3491 Malignant neoplasm of unspecified part of right bronchus or lung: Secondary | ICD-10-CM | POA: Diagnosis present

## 2022-02-05 LAB — GLUCOSE, CAPILLARY: Glucose-Capillary: 133 mg/dL — ABNORMAL HIGH (ref 70–99)

## 2022-02-05 MED ORDER — FLUDEOXYGLUCOSE F - 18 (FDG) INJECTION
11.5000 | Freq: Once | INTRAVENOUS | Status: AC | PRN
  Administered 2022-02-05: 12.08 via INTRAVENOUS

## 2022-02-12 ENCOUNTER — Telehealth: Payer: Self-pay

## 2022-02-12 DIAGNOSIS — C3491 Malignant neoplasm of unspecified part of right bronchus or lung: Secondary | ICD-10-CM

## 2022-02-12 NOTE — Telephone Encounter (Signed)
Called and informed patient of CT results and Dr. Collie Siad recommendations. Patient verbalized understanding and was following up in regards to their discussion about his port at his appt. Advised I would follow up with Dr. Tasia Catchings and give him feedback once received.    [place], please schedule patient for: CT chest w contrast in 3 months Lab/MD 2 days after.  Please notify patient of appt. Thanks.

## 2022-02-12 NOTE — Telephone Encounter (Signed)
-----   Message from [name], MD sent at 02/11/2022  3:00 PM EDT ----- Let patient know that PET scan result is good.  The area of concern may be secondary to inflammation.  I recommend repeat CT chest with contrast in 3 months for close monitoring. Please arrange lab MD follow-up after CT-please order CBC CMP

## 2022-02-12 NOTE — Telephone Encounter (Signed)
Spoke with patient in reference to his port possibly being removed which was discussed at his last visit. Per Dr. Tasia Catchings recommends that he wait until his CT scan in 3 months before having this removed. Patient verbalized understanding.

## 2022-03-15 ENCOUNTER — Other Ambulatory Visit: Payer: Self-pay | Admitting: Urology

## 2022-03-22 ENCOUNTER — Ambulatory Visit
Admission: RE | Admit: 2022-03-22 | Discharge: 2022-03-22 | Disposition: A | Discharge status: Home / Self Care | Payer: Medicare Other | Source: Ambulatory Visit | Attending: Radiation Oncology | Admitting: Radiation Oncology

## 2022-03-22 ENCOUNTER — Encounter: Payer: Self-pay | Admitting: Radiation Oncology

## 2022-03-22 ENCOUNTER — Inpatient Hospital Stay: Payer: Medicare Other | Attending: Oncology

## 2022-03-22 VITALS — BP 135/80 | HR 67 | Temp 96.5°F | Resp 16 | Wt 216.6 lb

## 2022-03-22 DIAGNOSIS — C3491 Malignant neoplasm of unspecified part of right bronchus or lung: Secondary | ICD-10-CM

## 2022-03-22 DIAGNOSIS — Z85118 Personal history of other malignant neoplasm of bronchus and lung: Secondary | ICD-10-CM | POA: Insufficient documentation

## 2022-03-22 DIAGNOSIS — Z452 Encounter for adjustment and management of vascular access device: Secondary | ICD-10-CM | POA: Insufficient documentation

## 2022-03-22 DIAGNOSIS — R918 Other nonspecific abnormal finding of lung field: Secondary | ICD-10-CM | POA: Diagnosis not present

## 2022-03-22 DIAGNOSIS — Z95828 Presence of other vascular implants and grafts: Secondary | ICD-10-CM

## 2022-03-22 DIAGNOSIS — Z923 Personal history of irradiation: Secondary | ICD-10-CM | POA: Insufficient documentation

## 2022-03-22 MED ORDER — SODIUM CHLORIDE 0.9% FLUSH
10.0000 mL | Freq: Once | INTRAVENOUS | Status: AC
  Administered 2022-03-22: 10 mL via INTRAVENOUS
  Filled 2022-03-22: qty 10

## 2022-03-22 MED ORDER — HEPARIN SOD (PORK) LOCK FLUSH 100 UNIT/ML IV SOLN
500.0000 [IU] | Freq: Once | INTRAVENOUS | Status: AC
  Administered 2022-03-22: 500 [IU] via INTRAVENOUS
  Filled 2022-03-22: qty 5

## 2022-03-22 NOTE — Progress Notes (Signed)
Radiation Oncology Follow up Note  Name: Mark Mccarty   Date:   03/22/2022 MRN:  [id number] DOB: 01-07-1951    This 71 y.o. male presents to the clinic today for 58-month follow-up status post.  S PRT to his left apical lung lesion and patient previously treated for stage III adenocarcinoma of the lung. REFERRING PROVIDER: No ref. provider found  HPI: Patient is a 71 year old male now about 9 months having completed SBRT to his left apical lung for presumed stage I adenocarcinoma.  He had previous been treated for stage III adenocarcinoma the right lung seen today in routine follow-up he is doing well his only complaint is his Port-A-Cath which continues to be painful he has notified the medical oncology team.  Specifically denies cough hemoptysis or chest tightness..  He had a CT scan back in July showing some subsolid area in the left lower lobe similar to prior imaging but with increased nodularity along the posterior pleural surface continue with this area suspicious for developing recurrent bronchogenic carcinoma.  He underwent a PET CT scan showing the area of increased nodularity in the subpleural left lower lobe not hypermetabolic.  There is also stable perifissural left lower lobe subsolid nodule also with low-level FDG uptake.  COMPLICATIONS OF TREATMENT: none  FOLLOW UP COMPLIANCE: keeps appointments   PHYSICAL EXAM:  BP 135/80 (BP Location: Left Arm, Patient Position: Sitting)   Pulse 67   Temp (!) 96.5 F (35.8 C) (Tympanic)   Resp 16   Wt 216 lb 9.6 oz (98.2 kg)   SpO2 96%   BMI 29.38 kg/m  Well-developed well-nourished patient in NAD. HEENT reveals PERLA, EOMI, discs not visualized.  Oral cavity is clear. No oral mucosal lesions are identified. Neck is clear without evidence of cervical or supraclavicular adenopathy. Lungs are clear to A&P. Cardiac examination is essentially unremarkable with regular rate and rhythm without murmur rub or thrill. Abdomen is benign with no  organomegaly or masses noted. Motor sensory and DTR levels are equal and symmetric in the upper and lower extremities. Cranial nerves II through XII are grossly intact. Proprioception is intact. No peripheral adenopathy or edema is identified. No motor or sensory levels are noted. Crude visual fields are within normal range.  RADIOLOGY RESULTS: CT scan and PET CT scan reviewed compatible with above-stated findings  PLAN: Present time patient is doing well with no evidence of disease.  I have asked to see him at 6 months with a follow-up CT scan at that time.  Patient knows to call with any concerns at any time.  I would like to take this opportunity to thank you for allowing me to participate in the care of your patient.[name], MD

## 2022-05-02 DIAGNOSIS — N486 Induration penis plastica: Secondary | ICD-10-CM

## 2022-05-02 HISTORY — DX: Induration penis plastica: N48.6

## 2022-05-15 ENCOUNTER — Encounter: Payer: Self-pay | Admitting: Urology

## 2022-05-15 ENCOUNTER — Ambulatory Visit: Payer: Medicare Other | Admitting: Urology

## 2022-05-15 ENCOUNTER — Other Ambulatory Visit
Admission: RE | Admit: 2022-05-15 | Discharge: 2022-05-15 | Disposition: A | Discharge status: Home / Self Care | Payer: Medicare Other | Attending: Urology | Admitting: Urology

## 2022-05-15 VITALS — BP 137/74 | HR 73 | Ht 72.0 in | Wt 225.0 lb

## 2022-05-15 DIAGNOSIS — R972 Elevated prostate specific antigen [PSA]: Secondary | ICD-10-CM | POA: Insufficient documentation

## 2022-05-15 DIAGNOSIS — N486 Induration penis plastica: Secondary | ICD-10-CM | POA: Diagnosis not present

## 2022-05-15 DIAGNOSIS — N3281 Overactive bladder: Secondary | ICD-10-CM

## 2022-05-15 MED ORDER — SOLIFENACIN SUCCINATE 10 MG PO TABS: 10.0000 mg | ORAL_TABLET | Freq: Every day | ORAL | 3 refills | Status: DC

## 2022-05-15 NOTE — Patient Instructions (Addendum)
Peyronie's ([id number]) disease is a noncancerous condition resulting from fibrous scar tissue that develops on the penis and causes curved, painful erections. Penises vary in shape and size, and having a curved erection isn't necessarily a cause for concern. But Peyronie's disease causes a significant bend or pain in some men.  This can prevent you from having sex or might make it difficult to get or maintain an erection (erectile dysfunction). For many men, Peyronie's disease also causes stress and anxiety. Penile shortening is another common concern.  Peyronie's disease rarely goes away on its own. In most men with Peyronie's disease, the condition will remain as is or worsen. Early treatment soon after developing the condition may keep it from getting worse or even improve symptoms. Even if you've had the condition for some time, treatment may help improve bothersome symptoms, such as pain, curvature and penile shortening.  Visit [email or web address].com to learn more about the injections for treatment of this condition.  Unfortunately these injections can be painful, and most patients see only about a 30% improvement in the curvature.

## 2022-05-15 NOTE — Progress Notes (Signed)
   05/15/2022 10:58 AM   Mark Mccarty May 20, 1951 [id number]  Reason for visit: BPH, OAB, PSA screening, ED, new penile curvature  HPI: 71 year old male with stage III lung cancer treated with chemoradiation and followed by oncology as well as Dr. Baruch Gouty.  He had a slight rise in his PSA in November 2022 to 5.03 from 3.7 previously, and repeat PSA decreased slightly to 4.8(13.5% free).  He opted for a repeat PSA in March 2023 which dropped to 4.42.  We reviewed the AUA guidelines that do not recommend routine screening in men over age 71, as well as in those with other comorbidities like lung cancer.  We reviewed options including observation, prostate MRI, or prostate biopsy.  He is more interested in a prostate MRI if the PSA remains significantly elevated.  He was interested in a repeat PSA today, and if stable would strongly recommend discontinuing PSA screening with his other comorbidities and age.  We previously tried oxybutynin for his overactive symptoms of urgency and urge incontinence without significant improvement.  We started Vesicare at his last visit which he think initially helped.  At some point someone told him this would cause bleeding and he stopped that medication, it is only resumed it in the last few weeks without significant change in his urinary symptoms.  I recommended continuing the Vesicare and that it may take 6 to 8 weeks to notice definitive improvement.  He is on 50 to 100 mg on demand sildenafil for ED which has not been as helpful recently.  He also reports new penile curvature over the last few months, but denies any pain with erections.  He reports his curvature is about 80 degrees upward, and prevent sexual activity.  On exam he does have an easily palpable plaque at the dorsal aspect of the base of the penis consistent with peyronies disease.  We reviewed treatment options at length including Xiaflex, but he is not interested in pursuing treatment at this  time.  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

## 2022-05-16 ENCOUNTER — Inpatient Hospital Stay: Payer: Medicare Other | Attending: Oncology

## 2022-05-16 ENCOUNTER — Ambulatory Visit
Admission: RE | Admit: 2022-05-16 | Discharge: 2022-05-16 | Disposition: A | Discharge status: Home / Self Care | Payer: Medicare Other | Source: Ambulatory Visit | Attending: Oncology | Admitting: Oncology

## 2022-05-16 ENCOUNTER — Ambulatory Visit: Admission: RE | Admit: 2022-05-16 | Payer: Medicare Other | Source: Ambulatory Visit

## 2022-05-16 DIAGNOSIS — Z9221 Personal history of antineoplastic chemotherapy: Secondary | ICD-10-CM | POA: Diagnosis not present

## 2022-05-16 DIAGNOSIS — Z7982 Long term (current) use of aspirin: Secondary | ICD-10-CM | POA: Diagnosis not present

## 2022-05-16 DIAGNOSIS — Z87891 Personal history of nicotine dependence: Secondary | ICD-10-CM | POA: Insufficient documentation

## 2022-05-16 DIAGNOSIS — Z923 Personal history of irradiation: Secondary | ICD-10-CM | POA: Diagnosis not present

## 2022-05-16 DIAGNOSIS — Z79899 Other long term (current) drug therapy: Secondary | ICD-10-CM | POA: Insufficient documentation

## 2022-05-16 DIAGNOSIS — Z452 Encounter for adjustment and management of vascular access device: Secondary | ICD-10-CM | POA: Diagnosis present

## 2022-05-16 DIAGNOSIS — C3491 Malignant neoplasm of unspecified part of right bronchus or lung: Secondary | ICD-10-CM | POA: Diagnosis present

## 2022-05-16 DIAGNOSIS — Z85118 Personal history of other malignant neoplasm of bronchus and lung: Secondary | ICD-10-CM | POA: Insufficient documentation

## 2022-05-16 DIAGNOSIS — E876 Hypokalemia: Secondary | ICD-10-CM | POA: Insufficient documentation

## 2022-05-16 LAB — CBC WITH DIFFERENTIAL/PLATELET
Abs Immature Granulocytes: 0.02 10*3/uL (ref 0.00–0.07)
Basophils Absolute: 0 10*3/uL (ref 0.0–0.1)
Basophils Relative: 1 %
Eosinophils Absolute: 0.2 10*3/uL (ref 0.0–0.5)
Eosinophils Relative: 3 %
HCT: 43.3 % (ref 39.0–52.0)
Hemoglobin: 14.9 g/dL (ref 13.0–17.0)
Immature Granulocytes: 0 %
Lymphocytes Relative: 15 %
Lymphs Abs: 1 10*3/uL (ref 0.7–4.0)
MCH: 29.4 pg (ref 26.0–34.0)
MCHC: 34.4 g/dL (ref 30.0–36.0)
MCV: 85.6 fL (ref 80.0–100.0)
Monocytes Absolute: 0.7 10*3/uL (ref 0.1–1.0)
Monocytes Relative: 10 %
Neutro Abs: 4.9 10*3/uL (ref 1.7–7.7)
Neutrophils Relative %: 71 %
Platelets: 235 10*3/uL (ref 150–400)
RBC: 5.06 MIL/uL (ref 4.22–5.81)
RDW: 13.8 % (ref 11.5–15.5)
WBC: 6.8 10*3/uL (ref 4.0–10.5)
nRBC: 0 % (ref 0.0–0.2)

## 2022-05-16 LAB — COMPREHENSIVE METABOLIC PANEL
ALT: 17 U/L (ref 0–44)
AST: 22 U/L (ref 15–41)
Albumin: 4 g/dL (ref 3.5–5.0)
Alkaline Phosphatase: 54 U/L (ref 38–126)
Anion gap: 7 (ref 5–15)
BUN: 13 mg/dL (ref 8–23)
CO2: 25 mmol/L (ref 22–32)
Calcium: 8.8 mg/dL — ABNORMAL LOW (ref 8.9–10.3)
Chloride: 104 mmol/L (ref 98–111)
Creatinine, Ser: 0.73 mg/dL (ref 0.61–1.24)
GFR, Estimated: >60 mL/min (ref >60)
Glucose, Bld: 113 mg/dL — ABNORMAL HIGH (ref 70–99)
Potassium: 3.4 mmol/L — ABNORMAL LOW (ref 3.5–5.1)
Sodium: 136 mmol/L (ref 135–145)
Total Bilirubin: 0.8 mg/dL (ref 0.3–1.2)
Total Protein: 7.2 g/dL (ref 6.5–8.1)

## 2022-05-16 LAB — PSA (REFLEX TO FREE) (SERIAL): Prostate Specific Ag, Serum: 6.7 ng/mL — ABNORMAL HIGH (ref 0.0–4.0)

## 2022-05-16 LAB — FPSA% REFLEX
% FREE PSA: 11.6 %
PSA, FREE: 0.78 ng/mL

## 2022-05-16 MED ORDER — IOHEXOL 300 MG/ML  SOLN
75.0000 mL | Freq: Once | INTRAMUSCULAR | Status: AC | PRN
  Administered 2022-05-16: 75 mL via INTRAVENOUS

## 2022-05-17 ENCOUNTER — Telehealth: Payer: Self-pay

## 2022-05-17 ENCOUNTER — Inpatient Hospital Stay: Payer: Medicare Other

## 2022-05-17 ENCOUNTER — Other Ambulatory Visit: Payer: Self-pay

## 2022-05-17 DIAGNOSIS — R972 Elevated prostate specific antigen [PSA]: Secondary | ICD-10-CM

## 2022-05-17 NOTE — Telephone Encounter (Signed)
Called pt, no answer. Unable to LM as voicemail is not set up. 1st attempt.

## 2022-05-17 NOTE — Telephone Encounter (Signed)
-----   Message from Billey Co, MD sent at 05/17/2022  1:08 PM EST ----- PSA overall stable. Recommend RTC 20mo PSA  prior, PVR  [name], MD 05/17/2022

## 2022-05-18 ENCOUNTER — Inpatient Hospital Stay: Payer: Medicare Other

## 2022-05-18 ENCOUNTER — Inpatient Hospital Stay (HOSPITAL_BASED_OUTPATIENT_CLINIC_OR_DEPARTMENT_OTHER): Payer: Medicare Other | Admitting: Oncology

## 2022-05-18 ENCOUNTER — Encounter: Payer: Self-pay | Admitting: Oncology

## 2022-05-18 VITALS — BP 144/80 | HR 67 | Temp 97.3°F | Resp 18 | Wt 223.6 lb

## 2022-05-18 DIAGNOSIS — Z95828 Presence of other vascular implants and grafts: Secondary | ICD-10-CM | POA: Diagnosis not present

## 2022-05-18 DIAGNOSIS — C3491 Malignant neoplasm of unspecified part of right bronchus or lung: Secondary | ICD-10-CM | POA: Diagnosis not present

## 2022-05-18 DIAGNOSIS — Z452 Encounter for adjustment and management of vascular access device: Secondary | ICD-10-CM | POA: Diagnosis not present

## 2022-05-18 DIAGNOSIS — E876 Hypokalemia: Secondary | ICD-10-CM | POA: Diagnosis not present

## 2022-05-18 MED ORDER — HEPARIN SOD (PORK) LOCK FLUSH 100 UNIT/ML IV SOLN
500.0000 [IU] | Freq: Once | INTRAVENOUS | Status: AC
  Administered 2022-05-18: 500 [IU] via INTRAVENOUS
  Filled 2022-05-18: qty 5

## 2022-05-18 MED ORDER — SODIUM CHLORIDE 0.9% FLUSH
10.0000 mL | Freq: Once | INTRAVENOUS | Status: AC
  Administered 2022-05-18: 10 mL via INTRAVENOUS
  Filled 2022-05-18: qty 10

## 2022-05-18 MED ORDER — POTASSIUM CHLORIDE ER 10 MEQ PO TBCR: 10.0000 meq | EXTENDED_RELEASE_TABLET | Freq: Every day | ORAL | 1 refills | Status: DC

## 2022-05-18 NOTE — Progress Notes (Unsigned)
Pt here for follow up. No new concerns voiced.   

## 2022-05-19 ENCOUNTER — Encounter: Payer: Self-pay | Admitting: Oncology

## 2022-05-19 NOTE — Assessment & Plan Note (Signed)
Continue potassium chloride 22meq daily.

## 2022-05-19 NOTE — Assessment & Plan Note (Signed)
Patient is interested in medi port removal.  Will arrange.

## 2022-05-19 NOTE — Progress Notes (Addendum)
Hematology/Oncology Progress note [phone] [phone]       Patient Care Team: [name], MD as PCP - General (Internal Medicine) [name], RN as Registered Nurse [name], MD as Consulting Physician (Oncology) [name], MD as Referring Physician (Radiation Oncology) [name], MD as Referring Physician (Vascular Surgery)    ASSESSMENT & PLAN:   Cancer Staging  Primary lung adenocarcinoma, right [facility]) Staging form: Lung, AJCC 8th Edition - Clinical stage from 12/13/2017: Stage IIIA (cT3, cN1, cM0) - Signed by [name], MD on 12/13/2017   Primary lung adenocarcinoma, right ([place]) #History of stage IIIA cT3 N1 M0 lung adenocarcinoma, s/p concurrent chemoradiation [Aug 2019].  Finished 1 year of durvalumab treatments. Presumed left upper lobe lung cancer status post SBRT in October 2022 CT scan images were reviewed by me and discussed findings with patient.  Continue image surveillance. Obtain CT in 6 months.   Hypokalemia Continue potassium chloride 79meq daily.  Orders Placed This Encounter  Procedures   CT CHEST ABDOMEN PELVIS W CONTRAST    To be done in May 2024, a few days prior to seeing Dr. Tasia Catchings.    Standing Status:   Future    Standing Expiration Date:   05/19/2023    Order Specific Question:   If indicated for the ordered procedure, I authorize the administration of contrast media per Radiology protocol    Answer:   Yes    Order Specific Question:   Does the patient have a contrast media/X-ray dye allergy?    Answer:   Yes    Order Specific Question:   Preferred imaging location?    Answer:   [facility]    Order Specific Question:   Is Oral Contrast requested for this exam?    Answer:   Yes, Per Radiology protocol   CBC with Differential/Platelet    Standing Status:   Future    Standing Expiration Date:   05/19/2023   Comprehensive metabolic panel    Standing Status:   Future    Standing Expiration Date:    05/18/2023   Follow up in 6 months.  All questions were answered. The patient knows to call the clinic with any problems, questions or concerns.  [name], MD, PhD [facility] Hematology Oncology 05/18/2022     REASON FOR VISIT Follow up for immunotherapy treatment for stage IIIA right lung adenocarcinoma, and presumed stage I left lung cancer  Oncology History:  Mark Mccarty is a  71 y.o.  male with presents for stage IIIA right lung adenocarcinoma, and presumed stage I left lung cancer Former 59 pack year smoking history.   Oncology History  Primary lung adenocarcinoma, right ([place])  12/13/2017 Initial Diagnosis   cT3 N1 M0 right upper lobe lung adenocarcinoma  August 2019 Finished  concurrent chemoradiation.   03/17/2019 Finished 1 year of durvalumab treatment.   12/13/2017 Cancer Staging   Staging form: Lung, AJCC 8th Edition - Clinical stage from 12/13/2017: Stage IIIA (cT3, cN1, cM0) - Signed by [name], MD on 12/13/2017   12/31/2017 - 02/19/2018 Chemotherapy   Concurrent chemotherapy carboplatin and Taxol. With radiation   03/24/2018 - 03/17/2019 Chemotherapy   LUNG DURVALUMAB Q14D x 1 year     12/01/2020 Imaging   PET  1. The 1.1 cm in long axis left apical nodule has maximum SUV of 2.2. Given the progression in size over the last year, the appearance is suspicious for small/low-grade adenocarcinoma.  2. Upper normal sized AP window  lymph nodes have maximum SUV just above blood pool, and are nonspecific.   Tumor board discussion 12/29/20  Slow-growing left upper lobe nodule.  CT-guided biopsy and biopsy via bronchoscopy are both of high risk due to underlying emphysema.  Consensus reached upon continue monitoring and repeat CT in 3 months.  If nodule continues to grow, he may benefit from empiric SBRT     03/13/2021 Imaging   CT without contrast showed slightly increase of left upper lobe nodule 12x14mm versus 11 x 8 mm.  Slightly increasing size.  Remains suspicious for  bronchogenic neoplasm. Subtle nodularity along the posterior left upper lobe approximately 6 mm is unchanged.  Posttreatment changes distort the right hilum are unchanged.   04/20/2021 - 04/25/2021 Radiation Therapy   Patient received radiation to left upper lobe  nodule for presumed Stage I left upper lobe lung cancer.    07/18/2021 Imaging    CT chest without contrast showed stable posttreatment changes within the right lung.  No findings to suggest recurrent tumor or metastatic disease.  Interval decrease in size of previously characterized irregular nodule in the left upper lobe.    10/18/2021 Imaging    CT chest showed a stable examination of chronic postradiation masslike fibrosis in the right lung. Persistent subsolid nodule in the superior segment of the left lower lobe 1.3 x 1.2 cm.  Nonspecific.  COPD findings, aortic atherosclerosis.  Coronary artery disease.   01/17/2022 Imaging   01/17/2022, CT chest without contrast showed subsolid area in the left lower lobe is similar to prior imaging however with increasing nodularity.  Recommend PET scan for further evaluation Perihilar parenchymal destruction and scarring from prior radiation in the right hilum extending into the right upper and lower lobe unchanged. Left upper lobe parenchymal scarring likely reflects treatment changes.  Marked pulmonary emphysema in the background.  Aortic atherosclerosis.   02/06/2022 Imaging   PET restaging 1. Focus of increasing nodularity in the subpleural left lower lobe identified as concerning on the recent CT chest shows only minimal FDG uptake on PET imaging today. While this is reassuring and the finding may reflect nfectious/inflammatory etiology, low-grade or well differentiated neoplasm can be poorly FDG avid. Close continued follow-up recommended. 2. Stable perifissural left lower lobe sub solid nodule, also with low level FDG uptake. Continued attention on follow-up recommende    05/17/2022  Imaging   1. Stable perihilar post radiation change and volume loss in the RIGHT hemithorax. 2. Stable mild mediastinal lymphadenopathy. 3. Peripheral nodule in the LEFT lower lobe measures slightly larger but has benign morphology. 4. Stable thickening RIGHT adrenal gland. 5. No new or progressive lung cancer identified     Multiple  liver nodules,  07/11/2020 MRI liver with and without contrast showed stable hypervascular lesions in the liver parenchyma without new or progressive findings.  Lesions remain most suggestive of benign etiology.  PET scan did not show evaluation activities.   INTERVAL HISTORY Mark Mccarty is a 71 y.o. male who has above history reviewed by me presents for follow-up of stage III A non-small cell lung cancer. Patient reports feeling well.  Chronic cough, unchanged. He denies hemoptysis, shortness of breath. He has no new complaints.    . Review of Systems  Constitutional:  Negative for appetite change, chills, fatigue, fever and unexpected weight change.  [name]:   Negative for hearing loss and voice change.   Eyes:  Negative for eye problems and icterus.  Respiratory:  Negative for chest tightness, cough and  shortness of breath.   Cardiovascular:  Negative for chest pain and leg swelling.  Gastrointestinal:  Negative for abdominal distention and abdominal pain.  Endocrine: Negative for hot flashes.  Genitourinary:  Negative for difficulty urinating, dysuria and frequency.   Musculoskeletal:  Negative for arthralgias.  Skin:  Negative for itching and rash.  Neurological:  Negative for headaches, light-headedness and numbness.  [name]:  Negative for adenopathy. Does not bruise/bleed easily.  Psychiatric/Behavioral:  Negative for confusion.      MEDICAL HISTORY:  Past Medical History:  Diagnosis Date   Aortic insufficiency    Asthma    BPH (benign prostatic hyperplasia)    Cancer (HCC)    lung   Cataract    left cataract surgery    Centrilobular emphysema (HCC)    Centrilobular emphysema (HCC)    Colon adenomas    Diverticulosis    Duodenitis    Dysphagia    Dyspnea    [name]    ED (erectile dysfunction)    Gastritis    GERD (gastroesophageal reflux disease)    [name]    Hyperlipidemia    Hypertension    Hyperuricemia    Left ventricular diastolic dysfunction    Neuromuscular disorder (HCC)    pt has knees bilateral with tendon issues that cause pain   [name] (peptic ulcer disease)    [name]     SURGICAL HISTORY: Past Surgical History:  Procedure Laterality Date   CARDIAC CATHETERIZATION N/A 12/16/2014   Procedure: Left Heart Cath;  Surgeon: [name], MD;  Location: [facility];  Service: Cardiovascular;  Laterality: N/A;   COLONOSCOPY     COLONOSCOPY WITH PROPOFOL N/A 01/14/2020   Procedure: COLONOSCOPY WITH PROPOFOL;  Surgeon: Toledo, Benay Pike, MD;  Location: [facility];  Service: Gastroenterology;  Laterality: N/A;   CYST REMOVAL TRUNK     chest and back over time and it was removed   CYSTECTOMY     ENDOBRONCHIAL ULTRASOUND N/A 12/09/2017   Procedure: ENDOBRONCHIAL ULTRASOUND;  Surgeon: [name], MD;  Location: [name];  Service: Pulmonary;  Laterality: N/A;   EYE SURGERY     left cataract surgery     PORTA CATH INSERTION N/A 12/23/2017   Procedure: PORTA CATH INSERTION;  Surgeon: [name], MD;  Location: [facility];  Service: Cardiovascular;  Laterality: N/A;   UPPER GI ENDOSCOPY      SOCIAL HISTORY: Social History   Socioeconomic History   Marital status: Divorced    Spouse name: Not on file   Number of children: Not on file   Years of education: Not on file   Highest education level: Not on file  Occupational History   Not on file  Tobacco Use   Smoking status: Former    Packs/day: 1.50    Years: 35.00    Total pack years: 52.50    Types: Cigarettes    Quit date: 06/22/2015    Years since  quitting: 6.9    Passive exposure: Past   Smokeless tobacco: Never  Vaping Use   Vaping Use: Never used  Substance and Sexual Activity   Alcohol use: Yes    Comment: weekends   Drug use: No   Sexual activity: Not Currently  Other Topics Concern   Not on file  Social History Narrative   Not on file   Social Determinants of Health   Financial Resource Strain: Not on file  Food Insecurity: Not on file  Transportation Needs:  Not on file  Physical Activity: Not on file  Stress: Not on file  Social Connections: Not on file  Intimate Partner Violence: Not on file    FAMILY HISTORY: Family History  Problem Relation Age of Onset   Hypertension Mother    Breast cancer Mother    Heart attack Mother    Lung cancer Father    Heart disease Sister    Diabetes Sister    Colon cancer Sister     ALLERGIES:  is allergic to penicillins.  MEDICATIONS:  Current Outpatient Medications  Medication Sig Dispense Refill   acetaminophen (TYLENOL) 500 MG tablet Take 500 mg by mouth as needed.     amLODipine (NORVASC) 10 MG tablet Take 10 mg by mouth daily.     ascorbic acid (VITAMIN C) 1000 MG tablet Take by mouth.     aspirin 81 MG tablet Take 81 mg by mouth daily.     carvedilol (COREG) 25 MG tablet Take 25 mg by mouth 2 (two) times daily with a meal.     Cholecalciferol 50 MCG (2000 UT) TABS Take by mouth.     gabapentin (NEURONTIN) 300 MG capsule Take 300 mg by mouth at bedtime as needed.      ibuprofen (ADVIL,MOTRIN) 600 MG tablet Take 600 mg by mouth every 6 (six) hours as needed.     lidocaine-prilocaine (EMLA) cream Apply to port area once 30 g 3   lisinopril (PRINIVIL,ZESTRIL) 40 MG tablet Take 40 mg by mouth daily.     pravastatin (PRAVACHOL) 20 MG tablet Take 20 mg by mouth at bedtime.     senna (SENOKOT) 8.6 MG TABS tablet Take 2 tablets (17.2 mg total) by mouth daily. 120 each 0   sildenafil (VIAGRA) 50 MG tablet Take 1-2 tablets (50-100 mg total) by mouth daily as needed for  erectile dysfunction. 30 tablet 6   solifenacin (VESICARE) 10 MG tablet Take 1 tablet (10 mg total) by mouth daily. 90 tablet 3   tamsulosin (FLOMAX) 0.4 MG CAPS capsule Take 0.4 mg by mouth daily.     potassium chloride (KLOR-CON) 10 MEQ tablet Take 1 tablet (10 mEq total) by mouth daily. 90 tablet 1   No current facility-administered medications for this visit.   Facility-Administered Medications Ordered in Other Visits  Medication Dose Route Frequency Provider Last Rate Last Admin   sodium chloride flush (NS) 0.9 % injection 10 mL  10 mL Intravenous PRN [name], MD   10 mL at 03/18/18 0858     PHYSICAL EXAMINATION: ECOG PERFORMANCE STATUS: 0 - Asymptomatic Vitals:   05/18/22 1144  BP: (!) 144/80  Pulse: 67  Resp: 18  Temp: (!) 97.3 F (36.3 C)  SpO2: 95%   Filed Weights   05/18/22 1144  Weight: 223 lb 9.6 oz (101.4 kg)    Physical Exam Constitutional:      General: He is not in acute distress. [name]:     Head: Normocephalic and atraumatic.  Eyes:     General: No scleral icterus.    Conjunctiva/sclera: Conjunctivae normal.     Pupils: Pupils are equal, round, and reactive to light.  Cardiovascular:     Rate and Rhythm: Normal rate and regular rhythm.     Heart sounds: Normal heart sounds.  Pulmonary:     Effort: Pulmonary effort is normal. No respiratory distress.     Breath sounds: No wheezing or rales.     Comments: Decreased breath sound bilaterally Chest:     Chest  wall: No tenderness.  Abdominal:     General: Bowel sounds are normal. There is no distension.     Palpations: Abdomen is soft. There is no mass.     Tenderness: There is no abdominal tenderness.  Musculoskeletal:        General: No deformity. Normal range of motion.     Cervical back: Normal range of motion and neck supple.  Lymphadenopathy:     Cervical: No cervical adenopathy.  Skin:    General: Skin is warm and dry.     Findings: No erythema or rash.     Comments: Chest wall Mediport  site no erythema or discharge. Left supraclavicular fossa fullness, chronic.  Neurological:     Mental Status: He is alert and oriented to person, place, and time. Mental status is at baseline.     Cranial Nerves: No cranial nerve deficit.     Coordination: Coordination normal.  Psychiatric:        Mood and Affect: Mood normal.      LABORATORY DATA:  I have reviewed the data as listed    Latest Ref Rng & Units 05/16/2022    8:54 AM 01/23/2022    1:09 PM 10/05/2021   10:53 AM  CBC  WBC 4.0 - 10.5 K/uL 6.8  7.1  6.9   Hemoglobin 13.0 - 17.0 g/dL 14.9  14.9  15.1   Hematocrit 39.0 - 52.0 % 43.3  43.3  44.5   Platelets 150 - 400 K/uL 235  239  233       Latest Ref Rng & Units 05/16/2022    8:54 AM 01/23/2022    1:09 PM 10/05/2021   10:53 AM  CMP  Glucose 70 - 99 mg/dL 113  106  114   BUN 8 - 23 mg/dL 13  18  10    Creatinine 0.61 - 1.24 mg/dL 0.73  0.84  0.85   Sodium 135 - 145 mmol/L 136  140  138   Potassium 3.5 - 5.1 mmol/L 3.4  3.4  3.5   Chloride 98 - 111 mmol/L 104  105  103   CO2 22 - 32 mmol/L 25  29  28    Calcium 8.9 - 10.3 mg/dL 8.8  9.0  8.8   Total Protein 6.5 - 8.1 g/dL 7.2  7.4  7.5   Total Bilirubin 0.3 - 1.2 mg/dL 0.8  0.4  0.7   Alkaline Phos 38 - 126 U/L 54  52  55   AST 15 - 41 U/L 22  19  20    ALT 0 - 44 U/L 17  16  18       RADIOGRAPHIC STUDIES: I have personally reviewed the radiological images as listed and agreed with the findings in the report.. CT Chest W Contrast  Result Date: 05/17/2022 CLINICAL DATA:  Lung cancer follow-up. RIGHT lung cancer diagnosed 2019. Radiation therapy and chemotherapy complete. * Tracking Code: BO * EXAM: CT CHEST WITH CONTRAST TECHNIQUE: Multidetector CT imaging of the chest was performed during intravenous contrast administration. RADIATION DOSE REDUCTION: This exam was performed according to the departmental dose-optimization program which includes automated exposure control, adjustment of the mA and/or kV according to  patient size and/or use of iterative reconstruction technique. CONTRAST:  35mL OMNIPAQUE IOHEXOL 300 MG/ML  SOLN COMPARISON:  PET-CT 02/05/2022 FINDINGS: Cardiovascular: Port in the anterior chest wall with tip in distal SVC. No acute vascular findings. Mediastinum/Nodes: Several prominent prevascular nodes are similar prior. For example 10 mm node on image  54/2 compares to 10 mm. LEFT lower paratracheal node measuring 9 mm compared to 9 mm. No new adenopathy. There is consolidation with air bronchograms at the RIGHT hilum consistent radiation change within lung and hilum. No new nodularity or growth identified. Lungs/Pleura: Volume loss and perihilar radiation change in the RIGHT lung is not changed from comparison exam. Paraseptal emphysema in the medial LEFT upper lobe is stable. Focus of of peripheral consolidation in the LEFT lower lobe measuring 18 mm by 12 mm (image 74/3) compares to 16 mm x 10 mm on recent recent PET-CT scan. Mild interstitial thickening in the LEFT upper lobe is similar prior. Upper Abdomen: Thickening of the medial limb of the RIGHT adrenal gland to 10 mm is unchanged from CT 10/18/2021 and CT 11/16/2020. LEFT adrenal gland normal. No hepatic lesion identified. Musculoskeletal: No aggressive osseous lesion. IMPRESSION: 1. Stable perihilar post radiation change and volume loss in the RIGHT hemithorax. 2. Stable mild mediastinal lymphadenopathy. 3. Peripheral nodule in the LEFT lower lobe measures slightly larger but has benign morphology. 4. Stable thickening RIGHT adrenal gland. 5. No new or progressive lung cancer identified. Electronically Signed   By: Suzy Bouchard M.D.   On: 05/17/2022 15:11

## 2022-05-19 NOTE — Assessment & Plan Note (Signed)
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  Finished 1 year of durvalumab treatments. Presumed left upper lobe lung cancer status post SBRT in October 2022 CT scan images were reviewed by me and discussed findings with patient.  Continue image surveillance. Obtain CT in 6 months.

## 2022-05-19 NOTE — H&P (View-Only) (Signed)
Hematology/Oncology Progress note [phone] [phone]       Patient Care Team: [name], MD as PCP - General (Internal Medicine) [name], RN as Registered Nurse [name], MD as Consulting Physician (Oncology) [name], MD as Referring Physician (Radiation Oncology) [name], MD as Referring Physician (Vascular Surgery)    ASSESSMENT & PLAN:   Cancer Staging  Primary lung adenocarcinoma, right [facility]) Staging form: Lung, AJCC 8th Edition - Clinical stage from 12/13/2017: Stage IIIA (cT3, cN1, cM0) - Signed by [name], MD on 12/13/2017   Primary lung adenocarcinoma, right ([place]) #History of stage IIIA cT3 N1 M0 lung adenocarcinoma, s/p concurrent chemoradiation [Aug 2019].  Finished 1 year of durvalumab treatments. Presumed left upper lobe lung cancer status post SBRT in October 2022 CT scan images were reviewed by me and discussed findings with patient.  Continue image surveillance. Obtain CT in 6 months.   Hypokalemia Continue potassium chloride 68meq daily.  Orders Placed This Encounter  Procedures   CT CHEST ABDOMEN PELVIS W CONTRAST    To be done in May 2024, a few days prior to seeing Dr. Tasia Catchings.    Standing Status:   Future    Standing Expiration Date:   05/19/2023    Order Specific Question:   If indicated for the ordered procedure, I authorize the administration of contrast media per Radiology protocol    Answer:   Yes    Order Specific Question:   Does the patient have a contrast media/X-ray dye allergy?    Answer:   Yes    Order Specific Question:   Preferred imaging location?    Answer:   [facility]    Order Specific Question:   Is Oral Contrast requested for this exam?    Answer:   Yes, Per Radiology protocol   CBC with Differential/Platelet    Standing Status:   Future    Standing Expiration Date:   05/19/2023   Comprehensive metabolic panel    Standing Status:   Future    Standing Expiration Date:    05/18/2023   Follow up in 6 months.  All questions were answered. The patient knows to call the clinic with any problems, questions or concerns.  [name], MD, PhD [facility] Hematology Oncology 05/18/2022     REASON FOR VISIT Follow up for immunotherapy treatment for stage IIIA right lung adenocarcinoma, and presumed stage I left lung cancer  Oncology History:  Mark Mccarty is a  71 y.o.  male with presents for stage IIIA right lung adenocarcinoma, and presumed stage I left lung cancer Former 59 pack year smoking history.   Oncology History  Primary lung adenocarcinoma, right ([place])  12/13/2017 Initial Diagnosis   cT3 N1 M0 right upper lobe lung adenocarcinoma  August 2019 Finished  concurrent chemoradiation.   03/17/2019 Finished 1 year of durvalumab treatment.   12/13/2017 Cancer Staging   Staging form: Lung, AJCC 8th Edition - Clinical stage from 12/13/2017: Stage IIIA (cT3, cN1, cM0) - Signed by [name], MD on 12/13/2017   12/31/2017 - 02/19/2018 Chemotherapy   Concurrent chemotherapy carboplatin and Taxol. With radiation   03/24/2018 - 03/17/2019 Chemotherapy   LUNG DURVALUMAB Q14D x 1 year     12/01/2020 Imaging   PET  1. The 1.1 cm in long axis left apical nodule has maximum SUV of 2.2. Given the progression in size over the last year, the appearance is suspicious for small/low-grade adenocarcinoma.  2. Upper normal sized AP window  lymph nodes have maximum SUV just above blood pool, and are nonspecific.   Tumor board discussion 12/29/20  Slow-growing left upper lobe nodule.  CT-guided biopsy and biopsy via bronchoscopy are both of high risk due to underlying emphysema.  Consensus reached upon continue monitoring and repeat CT in 3 months.  If nodule continues to grow, he may benefit from empiric SBRT     03/13/2021 Imaging   CT without contrast showed slightly increase of left upper lobe nodule 12x72mm versus 11 x 8 mm.  Slightly increasing size.  Remains suspicious for  bronchogenic neoplasm. Subtle nodularity along the posterior left upper lobe approximately 6 mm is unchanged.  Posttreatment changes distort the right hilum are unchanged.   04/20/2021 - 04/25/2021 Radiation Therapy   Patient received radiation to left upper lobe  nodule for presumed Stage I left upper lobe lung cancer.    07/18/2021 Imaging    CT chest without contrast showed stable posttreatment changes within the right lung.  No findings to suggest recurrent tumor or metastatic disease.  Interval decrease in size of previously characterized irregular nodule in the left upper lobe.    10/18/2021 Imaging    CT chest showed a stable examination of chronic postradiation masslike fibrosis in the right lung. Persistent subsolid nodule in the superior segment of the left lower lobe 1.3 x 1.2 cm.  Nonspecific.  COPD findings, aortic atherosclerosis.  Coronary artery disease.   01/17/2022 Imaging   01/17/2022, CT chest without contrast showed subsolid area in the left lower lobe is similar to prior imaging however with increasing nodularity.  Recommend PET scan for further evaluation Perihilar parenchymal destruction and scarring from prior radiation in the right hilum extending into the right upper and lower lobe unchanged. Left upper lobe parenchymal scarring likely reflects treatment changes.  Marked pulmonary emphysema in the background.  Aortic atherosclerosis.   02/06/2022 Imaging   PET restaging 1. Focus of increasing nodularity in the subpleural left lower lobe identified as concerning on the recent CT chest shows only minimal FDG uptake on PET imaging today. While this is reassuring and the finding may reflect nfectious/inflammatory etiology, low-grade or well differentiated neoplasm can be poorly FDG avid. Close continued follow-up recommended. 2. Stable perifissural left lower lobe sub solid nodule, also with low level FDG uptake. Continued attention on follow-up recommende    05/17/2022  Imaging   1. Stable perihilar post radiation change and volume loss in the RIGHT hemithorax. 2. Stable mild mediastinal lymphadenopathy. 3. Peripheral nodule in the LEFT lower lobe measures slightly larger but has benign morphology. 4. Stable thickening RIGHT adrenal gland. 5. No new or progressive lung cancer identified     Multiple  liver nodules,  07/11/2020 MRI liver with and without contrast showed stable hypervascular lesions in the liver parenchyma without new or progressive findings.  Lesions remain most suggestive of benign etiology.  PET scan did not show evaluation activities.   INTERVAL HISTORY Mark Mccarty is a 71 y.o. male who has above history reviewed by me presents for follow-up of stage III A non-small cell lung cancer. Patient reports feeling well.  Chronic cough, unchanged. He denies hemoptysis, shortness of breath. He has no new complaints.    . Review of Systems  Constitutional:  Negative for appetite change, chills, fatigue, fever and unexpected weight change.  [name]:   Negative for hearing loss and voice change.   Eyes:  Negative for eye problems and icterus.  Respiratory:  Negative for chest tightness, cough and  shortness of breath.   Cardiovascular:  Negative for chest pain and leg swelling.  Gastrointestinal:  Negative for abdominal distention and abdominal pain.  Endocrine: Negative for hot flashes.  Genitourinary:  Negative for difficulty urinating, dysuria and frequency.   Musculoskeletal:  Negative for arthralgias.  Skin:  Negative for itching and rash.  Neurological:  Negative for headaches, light-headedness and numbness.  [name]:  Negative for adenopathy. Does not bruise/bleed easily.  Psychiatric/Behavioral:  Negative for confusion.      MEDICAL HISTORY:  Past Medical History:  Diagnosis Date   Aortic insufficiency    Asthma    BPH (benign prostatic hyperplasia)    Cancer (HCC)    lung   Cataract    left cataract surgery    Centrilobular emphysema (HCC)    Centrilobular emphysema (HCC)    Colon adenomas    Diverticulosis    Duodenitis    Dysphagia    Dyspnea    [name]    ED (erectile dysfunction)    Gastritis    GERD (gastroesophageal reflux disease)    [name]    Hyperlipidemia    Hypertension    Hyperuricemia    Left ventricular diastolic dysfunction    Neuromuscular disorder (HCC)    pt has knees bilateral with tendon issues that cause pain   [name] (peptic ulcer disease)    [name]     SURGICAL HISTORY: Past Surgical History:  Procedure Laterality Date   CARDIAC CATHETERIZATION N/A 12/16/2014   Procedure: Left Heart Cath;  Surgeon: [name], MD;  Location: [facility];  Service: Cardiovascular;  Laterality: N/A;   COLONOSCOPY     COLONOSCOPY WITH PROPOFOL N/A 01/14/2020   Procedure: COLONOSCOPY WITH PROPOFOL;  Surgeon: Toledo, Benay Pike, MD;  Location: [facility];  Service: Gastroenterology;  Laterality: N/A;   CYST REMOVAL TRUNK     chest and back over time and it was removed   CYSTECTOMY     ENDOBRONCHIAL ULTRASOUND N/A 12/09/2017   Procedure: ENDOBRONCHIAL ULTRASOUND;  Surgeon: [name], MD;  Location: [name];  Service: Pulmonary;  Laterality: N/A;   EYE SURGERY     left cataract surgery     PORTA CATH INSERTION N/A 12/23/2017   Procedure: PORTA CATH INSERTION;  Surgeon: [name], MD;  Location: [facility];  Service: Cardiovascular;  Laterality: N/A;   UPPER GI ENDOSCOPY      SOCIAL HISTORY: Social History   Socioeconomic History   Marital status: Divorced    Spouse name: Not on file   Number of children: Not on file   Years of education: Not on file   Highest education level: Not on file  Occupational History   Not on file  Tobacco Use   Smoking status: Former    Packs/day: 1.50    Years: 35.00    Total pack years: 52.50    Types: Cigarettes    Quit date: 06/22/2015    Years since  quitting: 6.9    Passive exposure: Past   Smokeless tobacco: Never  Vaping Use   Vaping Use: Never used  Substance and Sexual Activity   Alcohol use: Yes    Comment: weekends   Drug use: No   Sexual activity: Not Currently  Other Topics Concern   Not on file  Social History Narrative   Not on file   Social Determinants of Health   Financial Resource Strain: Not on file  Food Insecurity: Not on file  Transportation Needs:  Not on file  Physical Activity: Not on file  Stress: Not on file  Social Connections: Not on file  Intimate Partner Violence: Not on file    FAMILY HISTORY: Family History  Problem Relation Age of Onset   Hypertension Mother    Breast cancer Mother    Heart attack Mother    Lung cancer Father    Heart disease Sister    Diabetes Sister    Colon cancer Sister     ALLERGIES:  is allergic to penicillins.  MEDICATIONS:  Current Outpatient Medications  Medication Sig Dispense Refill   acetaminophen (TYLENOL) 500 MG tablet Take 500 mg by mouth as needed.     amLODipine (NORVASC) 10 MG tablet Take 10 mg by mouth daily.     ascorbic acid (VITAMIN C) 1000 MG tablet Take by mouth.     aspirin 81 MG tablet Take 81 mg by mouth daily.     carvedilol (COREG) 25 MG tablet Take 25 mg by mouth 2 (two) times daily with a meal.     Cholecalciferol 50 MCG (2000 UT) TABS Take by mouth.     gabapentin (NEURONTIN) 300 MG capsule Take 300 mg by mouth at bedtime as needed.      ibuprofen (ADVIL,MOTRIN) 600 MG tablet Take 600 mg by mouth every 6 (six) hours as needed.     lidocaine-prilocaine (EMLA) cream Apply to port area once 30 g 3   lisinopril (PRINIVIL,ZESTRIL) 40 MG tablet Take 40 mg by mouth daily.     pravastatin (PRAVACHOL) 20 MG tablet Take 20 mg by mouth at bedtime.     senna (SENOKOT) 8.6 MG TABS tablet Take 2 tablets (17.2 mg total) by mouth daily. 120 each 0   sildenafil (VIAGRA) 50 MG tablet Take 1-2 tablets (50-100 mg total) by mouth daily as needed for  erectile dysfunction. 30 tablet 6   solifenacin (VESICARE) 10 MG tablet Take 1 tablet (10 mg total) by mouth daily. 90 tablet 3   tamsulosin (FLOMAX) 0.4 MG CAPS capsule Take 0.4 mg by mouth daily.     potassium chloride (KLOR-CON) 10 MEQ tablet Take 1 tablet (10 mEq total) by mouth daily. 90 tablet 1   No current facility-administered medications for this visit.   Facility-Administered Medications Ordered in Other Visits  Medication Dose Route Frequency Provider Last Rate Last Admin   sodium chloride flush (NS) 0.9 % injection 10 mL  10 mL Intravenous PRN [name], MD   10 mL at 03/18/18 0858     PHYSICAL EXAMINATION: ECOG PERFORMANCE STATUS: 0 - Asymptomatic Vitals:   05/18/22 1144  BP: (!) 144/80  Pulse: 67  Resp: 18  Temp: (!) 97.3 F (36.3 C)  SpO2: 95%   Filed Weights   05/18/22 1144  Weight: 223 lb 9.6 oz (101.4 kg)    Physical Exam Constitutional:      General: He is not in acute distress. [name]:     Head: Normocephalic and atraumatic.  Eyes:     General: No scleral icterus.    Conjunctiva/sclera: Conjunctivae normal.     Pupils: Pupils are equal, round, and reactive to light.  Cardiovascular:     Rate and Rhythm: Normal rate and regular rhythm.     Heart sounds: Normal heart sounds.  Pulmonary:     Effort: Pulmonary effort is normal. No respiratory distress.     Breath sounds: No wheezing or rales.     Comments: Decreased breath sound bilaterally Chest:     Chest  wall: No tenderness.  Abdominal:     General: Bowel sounds are normal. There is no distension.     Palpations: Abdomen is soft. There is no mass.     Tenderness: There is no abdominal tenderness.  Musculoskeletal:        General: No deformity. Normal range of motion.     Cervical back: Normal range of motion and neck supple.  Lymphadenopathy:     Cervical: No cervical adenopathy.  Skin:    General: Skin is warm and dry.     Findings: No erythema or rash.     Comments: Chest wall Mediport  site no erythema or discharge. Left supraclavicular fossa fullness, chronic.  Neurological:     Mental Status: He is alert and oriented to person, place, and time. Mental status is at baseline.     Cranial Nerves: No cranial nerve deficit.     Coordination: Coordination normal.  Psychiatric:        Mood and Affect: Mood normal.      LABORATORY DATA:  I have reviewed the data as listed    Latest Ref Rng & Units 05/16/2022    8:54 AM 01/23/2022    1:09 PM 10/05/2021   10:53 AM  CBC  WBC 4.0 - 10.5 K/uL 6.8  7.1  6.9   Hemoglobin 13.0 - 17.0 g/dL 14.9  14.9  15.1   Hematocrit 39.0 - 52.0 % 43.3  43.3  44.5   Platelets 150 - 400 K/uL 235  239  233       Latest Ref Rng & Units 05/16/2022    8:54 AM 01/23/2022    1:09 PM 10/05/2021   10:53 AM  CMP  Glucose 70 - 99 mg/dL 113  106  114   BUN 8 - 23 mg/dL 13  18  10    Creatinine 0.61 - 1.24 mg/dL 0.73  0.84  0.85   Sodium 135 - 145 mmol/L 136  140  138   Potassium 3.5 - 5.1 mmol/L 3.4  3.4  3.5   Chloride 98 - 111 mmol/L 104  105  103   CO2 22 - 32 mmol/L 25  29  28    Calcium 8.9 - 10.3 mg/dL 8.8  9.0  8.8   Total Protein 6.5 - 8.1 g/dL 7.2  7.4  7.5   Total Bilirubin 0.3 - 1.2 mg/dL 0.8  0.4  0.7   Alkaline Phos 38 - 126 U/L 54  52  55   AST 15 - 41 U/L 22  19  20    ALT 0 - 44 U/L 17  16  18       RADIOGRAPHIC STUDIES: I have personally reviewed the radiological images as listed and agreed with the findings in the report.. CT Chest W Contrast  Result Date: 05/17/2022 CLINICAL DATA:  Lung cancer follow-up. RIGHT lung cancer diagnosed 2019. Radiation therapy and chemotherapy complete. * Tracking Code: BO * EXAM: CT CHEST WITH CONTRAST TECHNIQUE: Multidetector CT imaging of the chest was performed during intravenous contrast administration. RADIATION DOSE REDUCTION: This exam was performed according to the departmental dose-optimization program which includes automated exposure control, adjustment of the mA and/or kV according to  patient size and/or use of iterative reconstruction technique. CONTRAST:  37mL OMNIPAQUE IOHEXOL 300 MG/ML  SOLN COMPARISON:  PET-CT 02/05/2022 FINDINGS: Cardiovascular: Port in the anterior chest wall with tip in distal SVC. No acute vascular findings. Mediastinum/Nodes: Several prominent prevascular nodes are similar prior. For example 10 mm node on image  54/2 compares to 10 mm. LEFT lower paratracheal node measuring 9 mm compared to 9 mm. No new adenopathy. There is consolidation with air bronchograms at the RIGHT hilum consistent radiation change within lung and hilum. No new nodularity or growth identified. Lungs/Pleura: Volume loss and perihilar radiation change in the RIGHT lung is not changed from comparison exam. Paraseptal emphysema in the medial LEFT upper lobe is stable. Focus of of peripheral consolidation in the LEFT lower lobe measuring 18 mm by 12 mm (image 74/3) compares to 16 mm x 10 mm on recent recent PET-CT scan. Mild interstitial thickening in the LEFT upper lobe is similar prior. Upper Abdomen: Thickening of the medial limb of the RIGHT adrenal gland to 10 mm is unchanged from CT 10/18/2021 and CT 11/16/2020. LEFT adrenal gland normal. No hepatic lesion identified. Musculoskeletal: No aggressive osseous lesion. IMPRESSION: 1. Stable perihilar post radiation change and volume loss in the RIGHT hemithorax. 2. Stable mild mediastinal lymphadenopathy. 3. Peripheral nodule in the LEFT lower lobe measures slightly larger but has benign morphology. 4. Stable thickening RIGHT adrenal gland. 5. No new or progressive lung cancer identified. Electronically Signed   By: Suzy Bouchard M.D.   On: 05/17/2022 15:11

## 2022-05-21 ENCOUNTER — Telehealth: Payer: Self-pay

## 2022-05-21 DIAGNOSIS — Z95828 Presence of other vascular implants and grafts: Secondary | ICD-10-CM

## 2022-05-21 NOTE — Telephone Encounter (Signed)
Referral sent to Dr. Bunnie Domino office for port removal

## 2022-05-21 NOTE — Telephone Encounter (Signed)
-----   Message from [name], MD sent at 05/19/2022 12:24 PM EST ----- Please ask if he would like to have medi port removed. If yes, please refer to vascular surgery If he prefers to keep port, please arrange port flush Q8w

## 2022-05-22 NOTE — Telephone Encounter (Signed)
Called pt informed him of the information below. Pt voiced understanding.

## 2022-05-29 ENCOUNTER — Telehealth (INDEPENDENT_AMBULATORY_CARE_PROVIDER_SITE_OTHER): Payer: Self-pay

## 2022-05-29 ENCOUNTER — Other Ambulatory Visit: Payer: Self-pay

## 2022-05-29 DIAGNOSIS — Z95828 Presence of other vascular implants and grafts: Secondary | ICD-10-CM
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
## 2022-05-30 ENCOUNTER — Encounter: Payer: Self-pay | Admitting: Oncology

## 2022-05-30 NOTE — Telephone Encounter (Signed)
Pt scheduled for port removal

## 2022-06-04 ENCOUNTER — Encounter
Admission: RE | Disposition: A | Discharge status: Home / Self Care | Payer: Self-pay | Source: Home / Self Care | Attending: Vascular Surgery

## 2022-06-04 ENCOUNTER — Other Ambulatory Visit: Payer: Self-pay

## 2022-06-04 ENCOUNTER — Ambulatory Visit
Admission: RE | Admit: 2022-06-04 | Discharge: 2022-06-04 | Disposition: A | Discharge status: Home / Self Care | Payer: Medicare Other | Attending: Vascular Surgery | Admitting: Vascular Surgery

## 2022-06-04 ENCOUNTER — Encounter: Payer: Self-pay | Admitting: Vascular Surgery

## 2022-06-04 DIAGNOSIS — C3490 Malignant neoplasm of unspecified part of unspecified bronchus or lung: Secondary | ICD-10-CM

## 2022-06-04 DIAGNOSIS — E876 Hypokalemia: Secondary | ICD-10-CM | POA: Diagnosis not present

## 2022-06-04 DIAGNOSIS — Z452 Encounter for adjustment and management of vascular access device: Secondary | ICD-10-CM | POA: Insufficient documentation

## 2022-06-04 DIAGNOSIS — Z87891 Personal history of nicotine dependence: Secondary | ICD-10-CM | POA: Diagnosis not present

## 2022-06-04 HISTORY — PX: PORTA CATH REMOVAL: CATH118286

## 2022-06-04 SURGERY — PORTA CATH REMOVAL
Anesthesia: Moderate Sedation

## 2022-06-04 MED ORDER — ONDANSETRON HCL 4 MG/2ML IJ SOLN: 4.0000 mg | Freq: Four times a day (QID) | INTRAMUSCULAR | Status: DC | PRN

## 2022-06-04 MED ORDER — SODIUM CHLORIDE 0.9 % IV SOLN: INTRAVENOUS | Status: DC

## 2022-06-04 MED ORDER — MIDAZOLAM HCL 2 MG/2ML IJ SOLN
INTRAMUSCULAR | Status: DC | PRN
  Administered 2022-06-04: 1 mg via INTRAVENOUS

## 2022-06-04 MED ORDER — MIDAZOLAM HCL 2 MG/2ML IJ SOLN
INTRAMUSCULAR | Status: AC
  Filled 2022-06-04: qty 2

## 2022-06-04 MED ORDER — HYDROMORPHONE HCL 1 MG/ML IJ SOLN: 1.0000 mg | Freq: Once | INTRAMUSCULAR | Status: DC | PRN

## 2022-06-04 MED ORDER — FENTANYL CITRATE PF 50 MCG/ML IJ SOSY
PREFILLED_SYRINGE | INTRAMUSCULAR | Status: AC
  Filled 2022-06-04: qty 1

## 2022-06-04 MED ORDER — VANCOMYCIN HCL IN DEXTROSE 1-5 GM/200ML-% IV SOLN
INTRAVENOUS | Status: AC
  Filled 2022-06-04: qty 200

## 2022-06-04 MED ORDER — DIPHENHYDRAMINE HCL 50 MG/ML IJ SOLN: 50.0000 mg | Freq: Once | INTRAMUSCULAR | Status: DC | PRN

## 2022-06-04 MED ORDER — VANCOMYCIN HCL IN DEXTROSE 1-5 GM/200ML-% IV SOLN
1000.0000 mg | INTRAVENOUS | Status: AC
  Administered 2022-06-04: 1000 mg via INTRAVENOUS

## 2022-06-04 MED ORDER — MIDAZOLAM HCL 2 MG/ML PO SYRP: 8.0000 mg | ORAL_SOLUTION | Freq: Once | ORAL | Status: DC | PRN

## 2022-06-04 MED ORDER — FENTANYL CITRATE (PF) 100 MCG/2ML IJ SOLN
INTRAMUSCULAR | Status: DC | PRN
  Administered 2022-06-04: 50 ug via INTRAVENOUS

## 2022-06-04 MED ORDER — FAMOTIDINE 20 MG PO TABS: 40.0000 mg | ORAL_TABLET | Freq: Once | ORAL | Status: DC | PRN

## 2022-06-04 MED ORDER — METHYLPREDNISOLONE SODIUM SUCC 125 MG IJ SOLR: 125.0000 mg | Freq: Once | INTRAMUSCULAR | Status: DC | PRN

## 2022-06-04 SURGICAL SUPPLY — 6 items
DERMABOND ADVANCED .7 DNX12 (GAUZE/BANDAGES/DRESSINGS) IMPLANT
PACK ANGIOGRAPHY (CUSTOM PROCEDURE TRAY) IMPLANT
PENCIL ELECTRO HAND CTR (MISCELLANEOUS) IMPLANT
SUT MNCRL AB 4-0 PS2 18 (SUTURE) IMPLANT
SUT VIC AB 3-0 SH 27 (SUTURE) ×1
SUT VIC AB 3-0 SH 27X BRD (SUTURE) IMPLANT

## 2022-06-04 NOTE — Op Note (Signed)
[facility]       Operative Note  Date: 06/04/2022  Preoperative diagnosis:  1. Lung cancer, no longer using port  Postoperative diagnosis:  Same as above  Procedures: #1. Removal of left jugular port a cath   Surgeon: Leotis Pain, MD  Anesthesia: Local with moderate conscious sedation for 15 minutes using 1 mg of Versed and 50 mcg of Fentanyl  Fluoroscopy time: none  Contrast used: 0  Estimated blood loss: Minimal  Indication for the procedure:  The patient is a 71 y.o. male who has completed his therapy for lung cancer and no longer needs their Port-A-Cath. The patient desires to have this removed. Risks and benefits including need for potential replacement with recurrent disease were discussed and patient is agreeable to proceed.  Description of procedure: The patient was brought to the vascular and interventional radiology suite. Moderate conscious sedation was administered during a face to face encounter with the patient throughout the procedure with my supervision of the RN administering medicines and monitoring the patient's vital signs, pulse oximetry, telemetry and mental status throughout from the start of the procedure until the patient was taken to the recovery room.  The left neck chest and shoulder were sterilely prepped and draped, and a sterile surgical field was created. The area was then anesthetized with 1% lidocaine copiously. The previous incision was reopened and electrocautery used to dissected down to the port and the catheter. These were dissected free and the catheter was gently removed from the vein in its entirety. The port was dissected out from the fibrous connective tissue and the Prolene sutures were removed. The port was then removed in its entirety including the catheter. The wound was then closed with a 3-0 Vicryl and a 4-0 Monocryl and Dermabond was placed as a dressing. The patient was then  taken to the recovery room in stable condition having tolerated the procedure well.  Complications: none  Condition: stable   Leotis Pain, MD 06/04/2022 10:13 AM   This note was created with Dragon Medical transcription system. Any errors in dictation are purely unintentional.

## 2022-06-04 NOTE — Interval H&P Note (Signed)
History and Physical Interval Note:  06/04/2022 8:14 AM  Mark Mccarty  has presented today for surgery, with the diagnosis of Porta Cath Removal    Lung Ca.  The various methods of treatment have been discussed with the patient and family. After consideration of risks, benefits and other options for treatment, the patient has consented to  Procedure(s): PORTA CATH REMOVAL (N/A) as a surgical intervention.  The patient's history has been reviewed, patient examined, no change in status, stable for surgery.  I have reviewed the patient's chart and labs.  Questions were answered to the patient's satisfaction.     Leotis Pain

## 2022-09-17 DIAGNOSIS — E538 Deficiency of other specified B group vitamins: Secondary | ICD-10-CM | POA: Insufficient documentation

## 2022-09-27 ENCOUNTER — Ambulatory Visit: Payer: Medicare Other | Admitting: Radiation Oncology

## 2022-11-13 ENCOUNTER — Ambulatory Visit: Payer: Medicare Other | Admitting: Urology

## 2022-11-13 ENCOUNTER — Encounter: Payer: Self-pay | Admitting: Urology

## 2022-11-13 VITALS — BP 121/73 | HR 71 | Ht 72.0 in | Wt 217.0 lb

## 2022-11-13 DIAGNOSIS — N3281 Overactive bladder: Secondary | ICD-10-CM | POA: Diagnosis not present

## 2022-11-13 DIAGNOSIS — N529 Male erectile dysfunction, unspecified: Secondary | ICD-10-CM

## 2022-11-13 DIAGNOSIS — R972 Elevated prostate specific antigen [PSA]: Secondary | ICD-10-CM | POA: Diagnosis not present

## 2022-11-13 LAB — BLADDER SCAN AMB NON-IMAGING

## 2022-11-13 MED ORDER — GEMTESA 75 MG PO TABS: 1.0000 | ORAL_TABLET | Freq: Every day | ORAL | 0 refills | Status: DC

## 2022-11-13 NOTE — Patient Instructions (Signed)
Nocturia refers to the need to wake up during the night to urinate, which can disrupt your sleep and impact your overall well-being. Fortunately, there are several strategies you can employ to help prevent or manage nocturia. It's important to consult with your healthcare provider before making any significant changes to your routine. Here are some helpful strategies to consider:  Limit Fluid Intake Before Bed: Avoid drinking large amounts of fluids in the evening, especially within a few hours of bedtime. Consume most of your daily fluid intake earlier in the day to reduce the need to urinate at night.  Monitor Your Diet: Limit your intake of caffeine and alcohol, as these substances can increase urine production and irritate the bladder.  Avoid diet, "zero calorie," and artificially sweetened drinks, especially sodas, in the afternoon or evening. Be mindful of consuming foods and drinks with high water content before bedtime, such as watermelon and herbal teas.  Time Your Medications: If you're taking medications that contribute to increased urination, consult your healthcare provider about adjusting the timing of these medications to minimize their impact during the night.  Practice Double Voiding: Before going to bed, make an effort to empty your bladder twice within a short period. This can help reduce the amount of urine left in your bladder before sleep.  Bladder Training: Gradually increase the time between bathroom visits during the day to train your bladder to hold larger volumes of urine. Over time, this can help reduce the frequency of nighttime awakenings to urinate.  Elevate Your Legs During the Day: Elevating your legs during the day can help minimize fluid retention in your lower extremities, which might reduce nighttime urination.  Pelvic Floor Exercises: Strengthening your pelvic floor muscles through Kegel exercises can help improve bladder control and potentially reduce  the urge to urinate at night.  Create a Relaxing Bedtime Routine: Stress and anxiety can exacerbate nocturia. Engage in calming activities before bed, such as reading, listening to soothing music, or practicing relaxation techniques.  Stay Active: Engage in regular physical activity, but avoid intense exercise close to bedtime, as this can increase your body's demand for fluids.  Maintain a Healthy Weight: Excess weight can compress the bladder and contribute to bladder and urinary issues. Aim to achieve and maintain a healthy weight through a balanced diet and regular exercise.  Remember that every individual is unique, and the effectiveness of these strategies may vary. It's important to work with your healthcare provider to develop a plan that suits your specific needs and addresses any underlying causes of nocturia.    

## 2022-11-13 NOTE — Progress Notes (Signed)
   11/13/2022 11:23 AM   Mark Mccarty 19-Apr-1951 [id number]  Reason for visit: BPH, OAB, PSA screening, ED, peyronies disease  HPI: 72 year-old male with stage III lung cancer treated with chemoradiation and followed by oncology as well as Dr. Rushie Chestnut.  I have followed him for urinary symptoms, volatile and mildly elevated PSA, ED, and BPH/OAB and urinary symptoms.  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  Regarding his peyronies in ED, he continues to have approximately 80 degree upward curvature that prevents sexual activity.  We again reviewed Xiaflex as a treatment option for more aggressive treatments like plication or prosthesis, and he is not interested in further treatments at this time.  Not taking the sildenafil.  In terms of PSA screening, he has had volatile PSA ranging from 3.7-7.8 over the last few years.  We have discussed options at length previously including prostate biopsy, prostate MRI, trending the PSA, or discontinuing screening.  He opted to trend the PSA.  PSA was 6.7 in November 2023 with 12% free, and decreased back down to 5.5 in March 2024.  I think with his stage III lung cancer it would be reasonable to discontinue screening.  Will rediscuss at next visit pending his lung cancer follow-up imaging.  In terms of his urinary symptoms he continues to report some frequency, occasional urgency, rare urge incontinence, nocturia 2-3 times at night.  He remains on Flomax and Vesicare.  He feels the Vesicare has not been as helpful, and he also reports very bothersome dry mouth.  We reviewed alternatives, and he was interested in a trial of [place].  Finally, we reviewed behavioral strategies including avoiding bladder irritants and minimizing fluid prior to bedtime.  -Trial of Gemtesa for overactive symptoms to replace Vesicare(bothersome dry mouth), he will call and if the [name] is helpful and he would like to pursue a  prescription -RTC 6 months symptom check  Sondra Come, MD  [facility] [address] [place], [place] [address] [phone]

## 2022-11-16 ENCOUNTER — Ambulatory Visit
Admission: RE | Admit: 2022-11-16 | Discharge: 2022-11-16 | Disposition: A | Discharge status: Home / Self Care | Payer: Medicare Other | Source: Ambulatory Visit | Attending: Oncology | Admitting: Oncology

## 2022-11-16 DIAGNOSIS — C3491 Malignant neoplasm of unspecified part of right bronchus or lung: Secondary | ICD-10-CM | POA: Insufficient documentation

## 2022-11-16 MED ORDER — IOHEXOL 300 MG/ML  SOLN
100.0000 mL | Freq: Once | INTRAMUSCULAR | Status: AC | PRN
  Administered 2022-11-16: 100 mL via INTRAVENOUS

## 2022-11-19 ENCOUNTER — Other Ambulatory Visit: Payer: Self-pay

## 2022-11-19 ENCOUNTER — Telehealth: Payer: Self-pay | Admitting: *Deleted

## 2022-11-19 ENCOUNTER — Telehealth: Payer: Self-pay

## 2022-11-19 DIAGNOSIS — C3491 Malignant neoplasm of unspecified part of right bronchus or lung: Secondary | ICD-10-CM

## 2022-11-19 NOTE — Telephone Encounter (Signed)
Called report  IMPRESSION: 1. New moderate right pleural effusion with diffuse, nodular, enhancing pleural thickening consistent with pleural metastatic disease. 2. Proximal obstruction of the right upper lobe segmental bronchi with complete atelectasis or consolidation of the right upper lobe, consistent with local malignant recurrence and postobstructive atelectasis of the upper lobe. 3. Interval increase in circumferential soft tissue thickening about the right hilum. Slight interval increase in size in enlarged AP window lymph nodes. 4. Interval enlargement of a subpleural mass of the dependent superior segment left lower lobe consistent with an enlarging pulmonary metastasis or metachronous primary lung malignancy. 5. Emphysema. 6. Coronary artery disease. 7. Prostatomegaly.   These results will be called to the ordering clinician or representative by the Radiologist Assistant, and communication documented in the PACS or [employer].   Aortic Atherosclerosis (ICD10-I70.0) and Emphysema (ICD10-J43.9).     Electronically Signed   By: Jearld Lesch M.D.   On: 11/16/2022 17:13

## 2022-11-19 NOTE — Telephone Encounter (Signed)
Per Dr. Cathie Hoops request, appt has been moved to 5/22 for CT results.   Spoke to pt to inform him of this and he is agreeable.  He stated that he did have shortness of breath. Will set him up for Thoracentesis

## 2022-11-20 ENCOUNTER — Other Ambulatory Visit: Payer: Self-pay | Admitting: Oncology

## 2022-11-20 ENCOUNTER — Ambulatory Visit
Admission: RE | Admit: 2022-11-20 | Discharge: 2022-11-20 | Disposition: A | Discharge status: Home / Self Care | Payer: Medicare Other | Source: Ambulatory Visit | Attending: Oncology | Admitting: Oncology

## 2022-11-20 ENCOUNTER — Encounter: Payer: Self-pay | Admitting: Oncology

## 2022-11-20 ENCOUNTER — Ambulatory Visit
Admission: RE | Admit: 2022-11-20 | Discharge: 2022-11-20 | Disposition: A | Discharge status: Home / Self Care | Payer: Medicare Other | Source: Ambulatory Visit | Attending: Student | Admitting: Student

## 2022-11-20 ENCOUNTER — Other Ambulatory Visit: Payer: Self-pay | Admitting: Student

## 2022-11-20 DIAGNOSIS — J90 Pleural effusion, not elsewhere classified: Secondary | ICD-10-CM

## 2022-11-20 DIAGNOSIS — C3491 Malignant neoplasm of unspecified part of right bronchus or lung: Secondary | ICD-10-CM | POA: Insufficient documentation

## 2022-11-20 LAB — BODY FLUID CELL COUNT WITH DIFFERENTIAL
Eos, Fluid: 0 %
Lymphs, Fluid: 25 %
Monocyte-Macrophage-Serous Fluid: 75 %
Neutrophil Count, Fluid: 0 %
Total Nucleated Cell Count, Fluid: 2060 cu mm

## 2022-11-20 LAB — ALBUMIN, PLEURAL OR PERITONEAL FLUID: Albumin, Fluid: 2.5 g/dL

## 2022-11-20 LAB — LACTATE DEHYDROGENASE, PLEURAL OR PERITONEAL FLUID: LD, Fluid: 463 U/L — ABNORMAL HIGH (ref 3–23)

## 2022-11-20 LAB — PROTEIN, PLEURAL OR PERITONEAL FLUID: Total protein, fluid: 3.9 g/dL

## 2022-11-20 LAB — GRAM STAIN: Gram Stain: NONE SEEN

## 2022-11-20 MED ORDER — LIDOCAINE HCL (PF) 1 % IJ SOLN
10.0000 mL | Freq: Once | INTRAMUSCULAR | Status: AC
  Administered 2022-11-20: 10 mL via SUBCUTANEOUS
  Filled 2022-11-20: qty 10

## 2022-11-20 NOTE — Telephone Encounter (Signed)
Pt scheduled for thoracentesis today

## 2022-11-20 NOTE — Procedures (Signed)
PROCEDURE SUMMARY:  Successful image-guided right thoracentesis. Yielded 1.0 L of hazy yellow fluid. Pt tolerated procedure well. No immediate complications. EBL = trace   Specimen was sent for labs. CXR ordered.  Please see imaging section of Epic for full dictation.  Kennieth Francois PA-C 11/20/2022 2:13 PM

## 2022-11-21 ENCOUNTER — Inpatient Hospital Stay: Payer: Medicare Other | Admitting: Oncology

## 2022-11-21 ENCOUNTER — Inpatient Hospital Stay: Payer: Medicare Other | Attending: Oncology

## 2022-11-21 ENCOUNTER — Encounter: Payer: Self-pay | Admitting: Oncology

## 2022-11-21 VITALS — BP 137/77 | HR 71 | Temp 97.2°F | Resp 18 | Wt 215.7 lb

## 2022-11-21 DIAGNOSIS — C3411 Malignant neoplasm of upper lobe, right bronchus or lung: Secondary | ICD-10-CM | POA: Diagnosis present

## 2022-11-21 DIAGNOSIS — Z87891 Personal history of nicotine dependence: Secondary | ICD-10-CM | POA: Diagnosis not present

## 2022-11-21 DIAGNOSIS — Z7982 Long term (current) use of aspirin: Secondary | ICD-10-CM | POA: Diagnosis not present

## 2022-11-21 DIAGNOSIS — Z923 Personal history of irradiation: Secondary | ICD-10-CM | POA: Insufficient documentation

## 2022-11-21 DIAGNOSIS — C3491 Malignant neoplasm of unspecified part of right bronchus or lung: Secondary | ICD-10-CM

## 2022-11-21 DIAGNOSIS — Z801 Family history of malignant neoplasm of trachea, bronchus and lung: Secondary | ICD-10-CM | POA: Insufficient documentation

## 2022-11-21 DIAGNOSIS — C801 Malignant (primary) neoplasm, unspecified: Secondary | ICD-10-CM

## 2022-11-21 DIAGNOSIS — F411 Generalized anxiety disorder: Secondary | ICD-10-CM | POA: Diagnosis not present

## 2022-11-21 DIAGNOSIS — Z79899 Other long term (current) drug therapy: Secondary | ICD-10-CM | POA: Diagnosis not present

## 2022-11-21 DIAGNOSIS — Z9221 Personal history of antineoplastic chemotherapy: Secondary | ICD-10-CM | POA: Diagnosis not present

## 2022-11-21 DIAGNOSIS — Z800 Family history of malignant neoplasm of digestive organs: Secondary | ICD-10-CM | POA: Diagnosis not present

## 2022-11-21 DIAGNOSIS — Z803 Family history of malignant neoplasm of breast: Secondary | ICD-10-CM | POA: Insufficient documentation

## 2022-11-21 LAB — CBC WITH DIFFERENTIAL/PLATELET
Abs Immature Granulocytes: 0.09 10*3/uL — ABNORMAL HIGH (ref 0.00–0.07)
Basophils Absolute: 0 10*3/uL (ref 0.0–0.1)
Basophils Relative: 0 %
Eosinophils Absolute: 0 10*3/uL (ref 0.0–0.5)
Eosinophils Relative: 0 %
HCT: 45.7 % (ref 39.0–52.0)
Hemoglobin: 15.3 g/dL (ref 13.0–17.0)
Immature Granulocytes: 1 %
Lymphocytes Relative: 5 %
Lymphs Abs: 0.7 10*3/uL (ref 0.7–4.0)
MCH: 28.5 pg (ref 26.0–34.0)
MCHC: 33.5 g/dL (ref 30.0–36.0)
MCV: 85.3 fL (ref 80.0–100.0)
Monocytes Absolute: 0.9 10*3/uL (ref 0.1–1.0)
Monocytes Relative: 6 %
Neutro Abs: 13.6 10*3/uL — ABNORMAL HIGH (ref 1.7–7.7)
Neutrophils Relative %: 88 %
Platelets: 270 10*3/uL (ref 150–400)
RBC: 5.36 MIL/uL (ref 4.22–5.81)
RDW: 13.2 % (ref 11.5–15.5)
WBC: 15.3 10*3/uL — ABNORMAL HIGH (ref 4.0–10.5)
nRBC: 0 % (ref 0.0–0.2)

## 2022-11-21 LAB — COMPREHENSIVE METABOLIC PANEL
ALT: 12 U/L (ref 0–44)
AST: 16 U/L (ref 15–41)
Albumin: 3.8 g/dL (ref 3.5–5.0)
Alkaline Phosphatase: 58 U/L (ref 38–126)
Anion gap: 9 (ref 5–15)
BUN: 12 mg/dL (ref 8–23)
CO2: 29 mmol/L (ref 22–32)
Calcium: 9.2 mg/dL (ref 8.9–10.3)
Chloride: 101 mmol/L (ref 98–111)
Creatinine, Ser: 0.77 mg/dL (ref 0.61–1.24)
GFR, Estimated: >60 mL/min (ref >60)
Glucose, Bld: 106 mg/dL — ABNORMAL HIGH (ref 70–99)
Potassium: 3.7 mmol/L (ref 3.5–5.1)
Sodium: 139 mmol/L (ref 135–145)
Total Bilirubin: 0.8 mg/dL (ref 0.3–1.2)
Total Protein: 7.3 g/dL (ref 6.5–8.1)

## 2022-11-21 LAB — LACTATE DEHYDROGENASE: LDH: 183 U/L (ref 98–192)

## 2022-11-21 MED ORDER — LORAZEPAM 0.5 MG PO TABS: 0.5000 mg | ORAL_TABLET | Freq: Two times a day (BID) | ORAL | 0 refills | Status: DC | PRN

## 2022-11-21 NOTE — Assessment & Plan Note (Signed)
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  Finished 1 year of durvalumab treatments. Presumed left upper lobe lung cancer status post SBRT in October 2022 CT scan images were reviewed by me and discussed findings with patient.  Unfortunately CT scanning findings are concerning for cancer recurrence, clinically stage IV disease. Status post therapeutic and diagnostic thoracentesis, cytology is pending.  Plan to send NGS if enough tissue. Check MRI brain with and without contrast. Reestablish with Dr. Rushie Chestnut radiation oncology for palliative radiation for occluded right upper lobe. Once recurrence is confirmed, most likely he will need chemotherapy/immunotherapy if NGS shows no targetable mutations.

## 2022-11-21 NOTE — Progress Notes (Signed)
Pt here for follow up. Pt had thoracentesis yesterday and shortness of breath has improved. Pt is currently on Doxycycline for URI.

## 2022-11-21 NOTE — Assessment & Plan Note (Signed)
Recommend low-dose lorazepam 0.5 mg every 12 hours as needed for anxiety

## 2022-11-21 NOTE — Progress Notes (Signed)
Hematology/Oncology Progress note [phone] [phone]       Patient Care Team: [facility] as PCP - General [name], RN as Registered Nurse [name], MD as Consulting Physician (Oncology) [name], MD as Referring Physician (Radiation Oncology) Wyn Quaker Marlow Baars, MD as Referring Physician (Vascular Surgery)   REASON FOR VISIT Follow up for immunotherapy treatment for stage IIIA right lung adenocarcinoma, and presumed stage I left lung cancer   ASSESSMENT & PLAN:   Cancer Staging  Primary lung adenocarcinoma, right [facility]) Staging form: Lung, AJCC 8th Edition - Clinical stage from 12/13/2017: Stage IIIA (cT3, cN1, cM0) - Signed by [name], MD on 12/13/2017   Primary lung adenocarcinoma, right (HCC) #History of stage IIIA cT3 N1 M0 lung adenocarcinoma, s/p concurrent chemoradiation [Aug 2019].  Finished 1 year of durvalumab treatments. Presumed left upper lobe lung cancer status post SBRT in October 2022 CT scan images were reviewed by me and discussed findings with patient.  Unfortunately CT scanning findings are concerning for cancer recurrence, clinically stage IV disease. Status post therapeutic and diagnostic thoracentesis, cytology is pending.  Plan to send NGS if enough tissue. Check MRI brain with and without contrast. Reestablish with Dr. Rushie Chestnut radiation oncology for palliative radiation for occluded right upper lobe. Once recurrence is confirmed, most likely he will need chemotherapy/immunotherapy if NGS shows no targetable mutations.  Anxiety associated with cancer diagnosis (HCC) Recommend low-dose lorazepam 0.5 mg every 12 hours as needed for anxiety  Orders Placed This Encounter  Procedures   MR Brain W Wo Contrast    Standing Status:   Future    Standing Expiration Date:   11/21/2023    Order Specific Question:   If indicated for the ordered procedure, I authorize the administration of contrast media per Radiology  protocol    Answer:   Yes    Order Specific Question:   What is the patient's sedation requirement?    Answer:   No Sedation    Order Specific Question:   Does the patient have a pacemaker or implanted devices?    Answer:   No    Order Specific Question:   Use SRS Protocol?    Answer:   Yes    Order Specific Question:   Preferred imaging location?    Answer:   [facility] (table limit - 550lbs)   Follow up to be determined. All questions were answered. The patient knows to call the clinic with any problems, questions or concerns.  [name], MD, PhD [facility] Hematology Oncology 11/21/2022      Oncology History:  Mark Mccarty is a  72 y.o.  male with presents for stage IIIA right lung adenocarcinoma, and presumed stage I left lung cancer Former 43 pack year smoking history.   Oncology History  Primary lung adenocarcinoma, right (HCC)  12/13/2017 Initial Diagnosis   cT3 N1 M0 right upper lobe lung adenocarcinoma  August 2019 Finished  concurrent chemoradiation.   03/17/2019 Finished 1 year of durvalumab treatment.   12/13/2017 Cancer Staging   Staging form: Lung, AJCC 8th Edition - Clinical stage from 12/13/2017: Stage IIIA (cT3, cN1, cM0) - Signed by [name], MD on 12/13/2017   12/31/2017 - 02/19/2018 Chemotherapy   Concurrent chemotherapy carboplatin and Taxol. With radiation   03/24/2018 - 03/17/2019 Chemotherapy   LUNG DURVALUMAB Q14D x 1 year     12/01/2020 Imaging   PET  1. The 1.1 cm in long axis left apical nodule has maximum SUV  of 2.2. Given the progression in size over the last year, the appearance is suspicious for small/low-grade adenocarcinoma.  2. Upper normal sized AP window lymph nodes have maximum SUV just above blood pool, and are nonspecific.   Tumor board discussion 12/29/20  Slow-growing left upper lobe nodule.  CT-guided biopsy and biopsy via bronchoscopy are both of high risk due to underlying emphysema.  Consensus reached upon continue monitoring  and repeat CT in 3 months.  If nodule continues to grow, he may benefit from empiric SBRT     03/13/2021 Imaging   CT without contrast showed slightly increase of left upper lobe nodule 12x92mm versus 11 x 8 mm.  Slightly increasing size.  Remains suspicious for bronchogenic neoplasm. Subtle nodularity along the posterior left upper lobe approximately 6 mm is unchanged.  Posttreatment changes distort the right hilum are unchanged.   04/20/2021 - 04/25/2021 Radiation Therapy   Patient received radiation to left upper lobe  nodule for presumed Stage I left upper lobe lung cancer.    07/18/2021 Imaging    CT chest without contrast showed stable posttreatment changes within the right lung.  No findings to suggest recurrent tumor or metastatic disease.  Interval decrease in size of previously characterized irregular nodule in the left upper lobe.    10/18/2021 Imaging    CT chest showed a stable examination of chronic postradiation masslike fibrosis in the right lung. Persistent subsolid nodule in the superior segment of the left lower lobe 1.3 x 1.2 cm.  Nonspecific.  COPD findings, aortic atherosclerosis.  Coronary artery disease.   01/17/2022 Imaging   01/17/2022, CT chest without contrast showed subsolid area in the left lower lobe is similar to prior imaging however with increasing nodularity.  Recommend PET scan for further evaluation Perihilar parenchymal destruction and scarring from prior radiation in the right hilum extending into the right upper and lower lobe unchanged. Left upper lobe parenchymal scarring likely reflects treatment changes.  Marked pulmonary emphysema in the background.  Aortic atherosclerosis.   02/06/2022 Imaging   PET restaging 1. Focus of increasing nodularity in the subpleural left lower lobe identified as concerning on the recent CT chest shows only minimal FDG uptake on PET imaging today. While this is reassuring and the finding may reflect nfectious/inflammatory  etiology, low-grade or well differentiated neoplasm can be poorly FDG avid. Close continued follow-up recommended. 2. Stable perifissural left lower lobe sub solid nodule, also with low level FDG uptake. Continued attention on follow-up recommende    05/17/2022 Imaging   1. Stable perihilar post radiation change and volume loss in the RIGHT hemithorax. 2. Stable mild mediastinal lymphadenopathy. 3. Peripheral nodule in the LEFT lower lobe measures slightly larger but has benign morphology. 4. Stable thickening RIGHT adrenal gland. 5. No new or progressive lung cancer identified   11/16/2022 Imaging   CT chest abdomen pelvis w contrast showed 1. New moderate right pleural effusion with diffuse, nodular,enhancing pleural thickening consistent with pleural metastatic disease. 2. Proximal obstruction of the right upper lobe segmental bronchi with complete atelectasis or consolidation of the right upper lobe, consistent with local malignant recurrence and postobstructive atelectasis of the upper lobe. 3. Interval increase in circumferential soft tissue thickening about the right hilum. Slight interval increase in size in enlarged AP window lymph nodes. 4. Interval enlargement of a subpleural mass of the dependent superior segment left lower lobe consistent with an enlarging pulmonary metastasis or metachronous primary lung malignancy.5. Emphysema. 6. Coronary artery disease. 7. Prostatomegaly.  Multiple  liver nodules,  07/11/2020 MRI liver with and without contrast showed stable hypervascular lesions in the liver parenchyma without new or progressive findings.  Lesions remain most suggestive of benign etiology.  PET scan did not show evaluation activities.   INTERVAL HISTORY Mark Mccarty is a 72 y.o. male who has above history reviewed by me presents for follow-up of stage III A non-small cell lung cancer. Patient reports 3 months of shortness of breath with exertion.  Recent CT  scan unfortunately showed possible recurrence.  New right pleural effusion status post thoracentesis.  Shortness of breath has improved.   . Review of Systems  Constitutional:  Negative for appetite change, chills, fatigue, fever and unexpected weight change.  [name]:   Negative for hearing loss and voice change.   Eyes:  Negative for eye problems and icterus.  Respiratory:  Negative for chest tightness, cough and shortness of breath.   Cardiovascular:  Negative for chest pain and leg swelling.  Gastrointestinal:  Negative for abdominal distention and abdominal pain.  Endocrine: Negative for hot flashes.  Genitourinary:  Negative for difficulty urinating, dysuria and frequency.   Musculoskeletal:  Negative for arthralgias.  Skin:  Negative for itching and rash.  Neurological:  Negative for headaches, light-headedness and numbness.  [name]:  Negative for adenopathy. Does not bruise/bleed easily.  Psychiatric/Behavioral:  Negative for confusion. The patient is nervous/anxious.      MEDICAL HISTORY:  Past Medical History:  Diagnosis Date   Aortic insufficiency    Asthma    BPH (benign prostatic hyperplasia)    Cancer (HCC)    lung   Cataract    left cataract surgery   Centrilobular emphysema (HCC)    Centrilobular emphysema (HCC)    Colon adenomas    Diverticulosis    Duodenitis    Dysphagia    Dyspnea    [name]    ED (erectile dysfunction)    Gastritis    GERD (gastroesophageal reflux disease)    [name]    Hyperlipidemia    Hypertension    Hyperuricemia    Left ventricular diastolic dysfunction    Neuromuscular disorder (HCC)    pt has knees bilateral with tendon issues that cause pain   [name] (peptic ulcer disease)    [name]     SURGICAL HISTORY: Past Surgical History:  Procedure Laterality Date   CARDIAC CATHETERIZATION N/A 12/16/2014   Procedure: Left Heart Cath;  Surgeon: Marcina Millard, MD;  Location: [facility]  INVASIVE CV LAB;  Service: Cardiovascular;  Laterality: N/A;   COLONOSCOPY     COLONOSCOPY WITH PROPOFOL N/A 01/14/2020   Procedure: COLONOSCOPY WITH PROPOFOL;  Surgeon: Toledo, Boykin Nearing, MD;  Location: [facility] ENDOSCOPY;  Service: Gastroenterology;  Laterality: N/A;   CYST REMOVAL TRUNK     chest and back over time and it was removed   CYSTECTOMY     ENDOBRONCHIAL ULTRASOUND N/A 12/09/2017   Procedure: ENDOBRONCHIAL ULTRASOUND;  Surgeon: Shane Crutch, MD;  Location: [name];  Service: Pulmonary;  Laterality: N/A;   EYE SURGERY     left cataract surgery     PORTA CATH INSERTION N/A 12/23/2017   Procedure: PORTA CATH INSERTION;  Surgeon: Annice Needy, MD;  Location: [facility] INVASIVE CV LAB;  Service: Cardiovascular;  Laterality: N/A;   PORTA CATH REMOVAL N/A 06/04/2022   Procedure: PORTA CATH REMOVAL;  Surgeon: Annice Needy, MD;  Location: [facility] INVASIVE CV LAB;  Service: Cardiovascular;  Laterality: N/A;   UPPER GI ENDOSCOPY  SOCIAL HISTORY: Social History   Socioeconomic History   Marital status: Divorced    Spouse name: Not on file   Number of children: Not on file   Years of education: Not on file   Highest education level: Not on file  Occupational History   Not on file  Tobacco Use   Smoking status: Former    Packs/day: 1.50    Years: 35.00    Additional pack years: 0.00    Total pack years: 52.50    Types: Cigarettes    Quit date: 06/22/2015    Years since quitting: 7.4    Passive exposure: Past   Smokeless tobacco: Never  Vaping Use   Vaping Use: Never used  Substance and Sexual Activity   Alcohol use: Yes    Comment: weekends   Drug use: No   Sexual activity: Not Currently  Other Topics Concern   Not on file  Social History Narrative   Lives with son [name].   Social Determinants of Health   Financial Resource Strain: Not on file  Food Insecurity: Not on file  Transportation Needs: Not on file  Physical Activity: Not on file  Stress: Not on file   Social Connections: Not on file  Intimate Partner Violence: Not on file    FAMILY HISTORY: Family History  Problem Relation Age of Onset   Hypertension Mother    Breast cancer Mother    Heart attack Mother    Lung cancer Father    Heart disease Sister    Diabetes Sister    Colon cancer Sister     ALLERGIES:  is allergic to penicillins.  MEDICATIONS:  Current Outpatient Medications  Medication Sig Dispense Refill   acetaminophen (TYLENOL) 500 MG tablet Take 500 mg by mouth as needed.     albuterol (VENTOLIN HFA) 108 (90 Base) MCG/ACT inhaler Inhale 2 puffs into the lungs every 4 (four) hours as needed.     amLODipine (NORVASC) 10 MG tablet Take 10 mg by mouth daily.     ascorbic acid (VITAMIN C) 1000 MG tablet Take by mouth.     aspirin 81 MG tablet Take 81 mg by mouth daily.     carvedilol (COREG) 25 MG tablet Take 25 mg by mouth 2 (two) times daily with a meal.     cetirizine (ZYRTEC) 10 MG tablet Take 1 tablet by mouth daily.     Cholecalciferol 50 MCG (2000 UT) TABS Take by mouth.     doxycycline (VIBRA-TABS) 100 MG tablet Take 100 mg by mouth 2 (two) times daily.     gabapentin (NEURONTIN) 300 MG capsule Take 300 mg by mouth at bedtime as needed.      ibuprofen (ADVIL,MOTRIN) 600 MG tablet Take 600 mg by mouth every 6 (six) hours as needed.     lisinopril (PRINIVIL,ZESTRIL) 40 MG tablet Take 40 mg by mouth daily.     LORazepam (ATIVAN) 0.5 MG tablet Take 1 tablet (0.5 mg total) by mouth every 12 (twelve) hours as needed for anxiety. 30 tablet 0   potassium chloride (KLOR-CON) 10 MEQ tablet Take 1 tablet (10 mEq total) by mouth daily. 90 tablet 1   pravastatin (PRAVACHOL) 20 MG tablet Take 20 mg by mouth at bedtime.     predniSONE (DELTASONE) 20 MG tablet Take 20 mg by mouth daily with breakfast. Daily for 5 days     senna (SENOKOT) 8.6 MG TABS tablet Take 2 tablets (17.2 mg total) by mouth daily. 120 each 0  sildenafil (VIAGRA) 50 MG tablet Take 1-2 tablets (50-100 mg  total) by mouth daily as needed for erectile dysfunction. 30 tablet 6   tamsulosin (FLOMAX) 0.4 MG CAPS capsule Take 0.4 mg by mouth daily.     Vibegron (GEMTESA) 75 MG TABS Take 1 tablet (75 mg total) by mouth daily. 30 tablet 0   lidocaine-prilocaine (EMLA) cream Apply to port area once (Patient not taking: Reported on 11/21/2022) 30 g 3   No current facility-administered medications for this visit.   Facility-Administered Medications Ordered in Other Visits  Medication Dose Route Frequency Provider Last Rate Last Admin   sodium chloride flush (NS) 0.9 % injection 10 mL  10 mL Intravenous PRN [name], MD   10 mL at 03/18/18 0858     PHYSICAL EXAMINATION: ECOG PERFORMANCE STATUS: 0 - Asymptomatic Vitals:   11/21/22 1025  BP: 137/77  Pulse: 71  Resp: 18  Temp: (!) 97.2 F (36.2 C)  SpO2: 97%   Filed Weights   11/21/22 1025  Weight: 215 lb 11.2 oz (97.8 kg)    Physical Exam Constitutional:      General: He is not in acute distress. [name]:     Head: Normocephalic and atraumatic.  Eyes:     General: No scleral icterus.    Pupils: Pupils are equal, round, and reactive to light.  Cardiovascular:     Rate and Rhythm: Normal rate and regular rhythm.     Heart sounds: Normal heart sounds.  Pulmonary:     Effort: Pulmonary effort is normal. No respiratory distress.     Breath sounds: No wheezing.     Comments: Decreased breath sound bilaterally Abdominal:     General: Bowel sounds are normal. There is no distension.     Palpations: Abdomen is soft.  Musculoskeletal:        General: No deformity. Normal range of motion.     Cervical back: Normal range of motion and neck supple.  Lymphadenopathy:     Cervical: No cervical adenopathy.  Skin:    General: Skin is warm and dry.     Findings: No erythema or rash.     Comments: Chest wall Mediport site no erythema or discharge. Left supraclavicular fossa fullness, chronic.  Neurological:     Mental Status: He is alert and  oriented to person, place, and time. Mental status is at baseline.     Cranial Nerves: No cranial nerve deficit.      LABORATORY DATA:  I have reviewed the data as listed    Latest Ref Rng & Units 11/21/2022   10:13 AM 05/16/2022    8:54 AM 01/23/2022    1:09 PM  CBC  WBC 4.0 - 10.5 K/uL 15.3  6.8  7.1   Hemoglobin 13.0 - 17.0 g/dL 21.3  08.6  57.8   Hematocrit 39.0 - 52.0 % 45.7  43.3  43.3   Platelets 150 - 400 K/uL 270  235  239       Latest Ref Rng & Units 11/21/2022   10:13 AM 05/16/2022    8:54 AM 01/23/2022    1:09 PM  CMP  Glucose 70 - 99 mg/dL 469  629  528   BUN 8 - 23 mg/dL 12  13  18    Creatinine 0.61 - 1.24 mg/dL 4.13  2.44  0.10   Sodium 135 - 145 mmol/L 139  136  140   Potassium 3.5 - 5.1 mmol/L 3.7  3.4  3.4   Chloride 98 -  111 mmol/L 101  104  105   CO2 22 - 32 mmol/L 29  25  29    Calcium 8.9 - 10.3 mg/dL 9.2  8.8  9.0   Total Protein 6.5 - 8.1 g/dL 7.3  7.2  7.4   Total Bilirubin 0.3 - 1.2 mg/dL 0.8  0.8  0.4   Alkaline Phos 38 - 126 U/L 58  54  52   AST 15 - 41 U/L 16  22  19    ALT 0 - 44 U/L 12  17  16       RADIOGRAPHIC STUDIES: I have personally reviewed the radiological images as listed and agreed with the findings in the report.[name] US THORACENTESIS ASP PLEURAL SPACE W/IMG GUIDE  Result Date: 11/20/2022 INDICATION: Right pleural effusion EXAM: ULTRASOUND GUIDED RIGHT THORACENTESIS MEDICATIONS: 7 cc 1% lidocaine COMPLICATIONS: None immediate. PROCEDURE: An ultrasound guided thoracentesis was thoroughly discussed with the patient and questions answered. The benefits, risks, alternatives and complications were also discussed. The patient understands and wishes to proceed with the procedure. Written consent was obtained. Ultrasound was performed to localize and mark an adequate pocket of fluid in the right chest. The area was then prepped and draped in the normal sterile fashion. 1% Lidocaine was used for local anesthesia. Under ultrasound guidance a catheter  was introduced. Thoracentesis was performed. The catheter was removed and a dressing applied. FINDINGS: A total of approximately 1 L of hazy yellow fluid was removed. Samples were sent to the laboratory as requested by the clinical team. IMPRESSION: Successful ultrasound guided right thoracentesis yielding 1 L of pleural fluid. Follow-up chest x-ray revealed no evidence of pneumothorax. Procedure performed by Mina Marble, PA-C Electronically Signed   By: Olive Bass M.D.   On: 11/20/2022 15:47   DG Chest Port 1 View  Result Date: 11/20/2022 CLINICAL DATA:  s/p thoracentesis, right EXAM: PORTABLE CHEST 1 VIEW COMPARISON:  Multiple prior CTs FINDINGS: There is right upper lobe collapse. The heart appears within normal limits. No significant pleural effusion appreciated status post right thoracentesis. No pneumothorax. The left lung is clear. No acute osseous abnormality. IMPRESSION: 1. No pneumothorax status post right thoracentesis. No significant residual pleural effusion. 2. Known right upper lobe collapse. Electronically Signed   By: Olive Bass M.D.   On: 11/20/2022 14:57   CT CHEST ABDOMEN PELVIS W CONTRAST  Result Date: 11/16/2022 CLINICAL DATA:  Lung cancer restaging, ongoing immunotherapy * Tracking Code: BO * EXAM: CT CHEST, ABDOMEN, AND PELVIS WITH CONTRAST TECHNIQUE: Multidetector CT imaging of the chest, abdomen and pelvis was performed following the standard protocol during bolus administration of intravenous contrast. RADIATION DOSE REDUCTION: This exam was performed according to the departmental dose-optimization program which includes automated exposure control, adjustment of the mA and/or kV according to patient size and/or use of iterative reconstruction technique. CONTRAST:  OMNIPAQUE IOHEXOL 300 MG/ML  SOLN COMPARISON:  CT chest, 05/16/2022, PET-CT, 02/05/2022 FINDINGS: CT CHEST FINDINGS Cardiovascular: Aortic atherosclerosis. Normal heart size. Scattered left coronary artery  calcifications. No pericardial effusion. Mediastinum/Nodes: Interval increase in circumferential soft tissue thickening about the right hilum (series 2, image 24). Slight interval increase in size in enlarged AP window lymph nodes, measuring up to 2.3 x 1.1 cm, previously 1.7 x 0.9 cm (series 2, image 42). Thyroid gland, trachea, and esophagus demonstrate no significant findings. Lungs/Pleura: New moderate right pleural effusion with diffuse, nodular, enhancing pleural thickening (series 2, image 29). Proximal obstruction of the right upper lobe segmental bronchi with complete atelectasis or  consolidation of the right upper lobe, new compared to prior examination (series 2, image 19). Interval enlargement of a subpleural mass of the dependent superior segment left lower lobe, measuring 2.4 x 2.1 cm, previously 1.8 x 1.2 cm (series 3, image 68). Severe underlying emphysema. Musculoskeletal: No chest wall abnormality. No acute osseous findings. CT ABDOMEN PELVIS FINDINGS Hepatobiliary: No solid liver abnormality is seen. No gallstones, gallbladder wall thickening, or biliary dilatation. Pancreas: Unremarkable. No pancreatic ductal dilatation or surrounding inflammatory changes. Spleen: Normal in size without significant abnormality. Adrenals/Urinary Tract: Adrenal glands are unremarkable. Kidneys are normal, without renal calculi, solid lesion, or hydronephrosis. Bladder is unremarkable. Stomach/Bowel: Stomach is within normal limits. Appendix appears normal. No evidence of bowel wall thickening, distention, or inflammatory changes. Pancolonic diverticulosis. Moderate burden of stool throughout the colon. Vascular/Lymphatic: Aortic atherosclerosis. No enlarged abdominal or pelvic lymph nodes. Reproductive: Prostatomegaly. Other: No abdominal wall hernia or abnormality. No ascites. Musculoskeletal: No acute osseous findings. Unchanged sclerotic, trabeculated vertebral body hemangiomata of T10 and L2 (series 6, image  100). No suspicious osseous lesions. 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 IMPRESSION: 1. New moderate right pleural effusion with diffuse, nodular, enhancing pleural thickening consistent with pleural metastatic disease. 2. Proximal obstruction of the right upper lobe segmental bronchi with complete atelectasis or consolidation of the right upper lobe, consistent with local malignant recurrence and postobstructive atelectasis of the upper lobe. 3. Interval increase in circumferential soft tissue thickening about the right hilum. Slight interval increase in size in enlarged AP window lymph nodes. 4. Interval enlargement of a subpleural mass of the dependent superior segment left lower lobe consistent with an enlarging pulmonary metastasis or metachronous primary lung malignancy. 5. Emphysema. 6. Coronary artery disease. 7. Prostatomegaly. These results will be called to the ordering clinician or representative by the Radiologist Assistant, and communication documented in the PACS or [employer]. Aortic Atherosclerosis (ICD10-I70.0) and Emphysema (ICD10-J43.9). Electronically Signed   By: Jearld Lesch M.D.   On: 11/16/2022 17:13

## 2022-11-23 LAB — CYTOLOGY - NON PAP

## 2022-11-27 ENCOUNTER — Telehealth: Payer: Self-pay | Admitting: *Deleted

## 2022-11-27 NOTE — Telephone Encounter (Signed)
Spoke with patient and updated him on MD recommendations regarding referral to pulmonology for biopsy. Pt stated that he does not have preference on who he would like to see. Pt previously seen by Dr. Meredeth Ide at [facility] pulmonary in 2019. Urgent referral faxed to [facility] pulmonary to schedule appt. Pt informed to expect call from their office with an appt. Pt verbalized understanding. Nothing further needed at this time.

## 2022-11-27 NOTE — Telephone Encounter (Signed)
-----   Message from [name], MD sent at 11/27/2022 12:30 PM EDT ----- Regarding: RE: Cytology Please arrange him to see pulmonology ASAP for biopsy.  I will see him 1 week after biopsy Thank you  zy ----- Message ----- From: [name], RN Sent: 11/27/2022   8:06 AM EDT To: Coralee Rud, RN; Paulita Fujita, CMA; # Subject: Cytology                                       Pt's cytology has been reported from recent thoracentesis. Tissue is not sufficient for additional molecular studies. He has an appt scheduled with Dr. Rushie Chestnut on 5/29 but there are no appts scheduled with you. Brain MRI is scheduled for 6/5.  When do you want to follow up with patient to discuss treatment?  [facility]

## 2022-11-28 ENCOUNTER — Ambulatory Visit: Payer: Medicare Other | Attending: Radiation Oncology | Admitting: Radiation Oncology

## 2022-11-29 ENCOUNTER — Encounter: Payer: Self-pay | Admitting: Radiation Oncology

## 2022-11-29 ENCOUNTER — Other Ambulatory Visit: Payer: Self-pay | Admitting: *Deleted

## 2022-11-29 ENCOUNTER — Ambulatory Visit: Payer: Medicare Other | Admitting: Oncology

## 2022-11-29 ENCOUNTER — Other Ambulatory Visit: Payer: Medicare Other

## 2022-11-29 ENCOUNTER — Ambulatory Visit
Admission: RE | Admit: 2022-11-29 | Discharge: 2022-11-29 | Disposition: A | Discharge status: Home / Self Care | Payer: Medicare Other | Source: Ambulatory Visit | Attending: Radiation Oncology | Admitting: Radiation Oncology

## 2022-11-29 VITALS — BP 144/75 | HR 81 | Temp 98.0°F | Resp 16 | Ht 72.0 in | Wt 216.3 lb

## 2022-11-29 DIAGNOSIS — C3411 Malignant neoplasm of upper lobe, right bronchus or lung: Secondary | ICD-10-CM | POA: Insufficient documentation

## 2022-11-29 DIAGNOSIS — C3490 Malignant neoplasm of unspecified part of unspecified bronchus or lung: Secondary | ICD-10-CM

## 2022-11-29 DIAGNOSIS — C3491 Malignant neoplasm of unspecified part of right bronchus or lung: Secondary | ICD-10-CM

## 2022-11-29 NOTE — Progress Notes (Signed)
Radiation Oncology Follow up Note  Name: Mark Mccarty   Date:   11/29/2022 MRN:  [id number] DOB: March 24, 1951    This 72 y.o. male presents to the clinic today for reevaluation of recurrent adenocarcinoma previously treated for stage III adenocarcinoma of the lung with.  Proximal obstruction of the right upper lobe.  REFERRING PROVIDER: Mickey Farber, MD  HPI: Patient is a 72 year old male well-known to our department having previously been treated for stage III adenocarcinoma of the lung.[name]  His initial tumor was stage III aT3 N1 M0.  He is also finished 1 year of Durvalumab.  Recent CT scan shows progression of disease and the patient is status post diagnostic thoracentesis.  Recent CT scan shows proximal obstruction of the right upper lobe with complete atelectasis of the right upper lobe consistent with malignant recurrence and postobstructive atelectasis.  He is also demonstrating circumferential soft tissue thickening about the right hilum.  COMPLICATIONS OF TREATMENT: none  FOLLOW UP COMPLIANCE: keeps appointments   PHYSICAL EXAM:  BP (!) 144/75   Pulse 81   Temp 98 F (36.7 C)   Resp 16   Ht 6' (1.829 m)   Wt 216 lb 4.8 oz (98.1 kg)   BMI 29.34 kg/m  Well-developed well-nourished patient in NAD. HEENT reveals PERLA, EOMI, discs not visualized.  Oral cavity is clear. No oral mucosal lesions are identified. Neck is clear without evidence of cervical or supraclavicular adenopathy. Lungs are clear to A&P. Cardiac examination is essentially unremarkable with regular rate and rhythm without murmur rub or thrill. Abdomen is benign with no organomegaly or masses noted. Motor sensory and DTR levels are equal and symmetric in the upper and lower extremities. Cranial nerves II through XII are grossly intact. Proprioception is intact. No peripheral adenopathy or edema is identified. No motor or sensory levels are noted. Crude visual fields are within normal range.  RADIOLOGY RESULTS: CT  scans reviewed PET scan ordered  PLAN: Present time I ordered a PET scan this week.  I have also set him up for simulation next week for palliative radiation therapy.  Based on his PET CT scan findings would target hypermetabolic tumor to 40 Gray over 4 weeks and evaluate for response.  Patient comprehends my recommendations well.  Both simulation and PET scan appointments were arranged.  I would like to take this opportunity to thank you for allowing me to participate in the care of your patient.[name], MD

## 2022-11-30 ENCOUNTER — Ambulatory Visit
Admission: RE | Admit: 2022-11-30 | Discharge: 2022-11-30 | Disposition: A | Discharge status: Home / Self Care | Payer: Medicare Other | Source: Ambulatory Visit | Attending: Radiation Oncology | Admitting: Radiation Oncology

## 2022-11-30 DIAGNOSIS — C3490 Malignant neoplasm of unspecified part of unspecified bronchus or lung: Secondary | ICD-10-CM

## 2022-12-03 ENCOUNTER — Ambulatory Visit
Admission: RE | Admit: 2022-12-03 | Discharge: 2022-12-03 | Disposition: A | Discharge status: Home / Self Care | Payer: Medicare Other | Source: Ambulatory Visit | Attending: Radiation Oncology | Admitting: Radiation Oncology

## 2022-12-03 DIAGNOSIS — J9819 Other pulmonary collapse: Secondary | ICD-10-CM | POA: Insufficient documentation

## 2022-12-03 DIAGNOSIS — C3491 Malignant neoplasm of unspecified part of right bronchus or lung: Secondary | ICD-10-CM | POA: Diagnosis not present

## 2022-12-03 DIAGNOSIS — R590 Localized enlarged lymph nodes: Secondary | ICD-10-CM | POA: Diagnosis present

## 2022-12-03 LAB — GLUCOSE, CAPILLARY: Glucose-Capillary: 101 mg/dL — ABNORMAL HIGH (ref 70–99)

## 2022-12-03 MED ORDER — FLUDEOXYGLUCOSE F - 18 (FDG) INJECTION
11.2000 | Freq: Once | INTRAVENOUS | Status: AC | PRN
  Administered 2022-12-03: 12.1 via INTRAVENOUS

## 2022-12-05 ENCOUNTER — Ambulatory Visit
Admission: RE | Admit: 2022-12-05 | Discharge: 2022-12-05 | Disposition: A | Discharge status: Home / Self Care | Payer: Medicare Other | Source: Ambulatory Visit | Attending: Oncology | Admitting: Oncology

## 2022-12-05 ENCOUNTER — Ambulatory Visit: Admission: RE | Admit: 2022-12-05 | Payer: Medicare Other | Source: Ambulatory Visit

## 2022-12-05 ENCOUNTER — Ambulatory Visit: Payer: Medicare Other

## 2022-12-05 ENCOUNTER — Ambulatory Visit: Admission: RE | Admit: 2022-12-05 | Payer: Medicare Other | Source: Ambulatory Visit | Admitting: Radiation Oncology

## 2022-12-05 DIAGNOSIS — Z510 Encounter for antineoplastic radiation therapy: Secondary | ICD-10-CM | POA: Insufficient documentation

## 2022-12-05 DIAGNOSIS — Z87891 Personal history of nicotine dependence: Secondary | ICD-10-CM | POA: Diagnosis not present

## 2022-12-05 DIAGNOSIS — C3491 Malignant neoplasm of unspecified part of right bronchus or lung: Secondary | ICD-10-CM | POA: Insufficient documentation

## 2022-12-05 DIAGNOSIS — C3412 Malignant neoplasm of upper lobe, left bronchus or lung: Secondary | ICD-10-CM | POA: Insufficient documentation

## 2022-12-05 MED ORDER — GADOBUTROL 1 MMOL/ML IV SOLN
10.0000 mL | Freq: Once | INTRAVENOUS | Status: AC | PRN
  Administered 2022-12-05: 10 mL via INTRAVENOUS

## 2022-12-06 ENCOUNTER — Other Ambulatory Visit: Payer: Self-pay | Admitting: Pulmonary Disease

## 2022-12-06 DIAGNOSIS — C3491 Malignant neoplasm of unspecified part of right bronchus or lung: Secondary | ICD-10-CM

## 2022-12-06 DIAGNOSIS — R918 Other nonspecific abnormal finding of lung field: Secondary | ICD-10-CM

## 2022-12-06 DIAGNOSIS — J910 Malignant pleural effusion: Secondary | ICD-10-CM

## 2022-12-07 ENCOUNTER — Encounter
Admission: RE | Admit: 2022-12-07 | Discharge: 2022-12-07 | Disposition: A | Discharge status: Home / Self Care | Payer: Medicare Other | Source: Ambulatory Visit | Attending: Pulmonary Disease | Admitting: Pulmonary Disease

## 2022-12-07 ENCOUNTER — Other Ambulatory Visit: Payer: Self-pay | Admitting: *Deleted

## 2022-12-07 VITALS — Ht 72.0 in | Wt 216.3 lb

## 2022-12-07 DIAGNOSIS — C3491 Malignant neoplasm of unspecified part of right bronchus or lung: Secondary | ICD-10-CM

## 2022-12-07 DIAGNOSIS — I152 Hypertension secondary to endocrine disorders: Secondary | ICD-10-CM

## 2022-12-07 DIAGNOSIS — Z01818 Encounter for other preprocedural examination: Secondary | ICD-10-CM

## 2022-12-07 DIAGNOSIS — R0602 Shortness of breath: Secondary | ICD-10-CM

## 2022-12-07 HISTORY — DX: Polyneuropathy, unspecified: G62.9

## 2022-12-07 HISTORY — DX: Localized edema: R60.0

## 2022-12-07 HISTORY — DX: Idiopathic aseptic necrosis of unspecified femur: M87.059

## 2022-12-07 HISTORY — DX: Personal history of nicotine dependence: Z87.891

## 2022-12-07 HISTORY — DX: Disorder of kidney and ureter, unspecified: N28.9

## 2022-12-07 HISTORY — DX: Anxiety disorder, unspecified: F41.9

## 2022-12-07 HISTORY — DX: Type 2 diabetes mellitus without complications: E11.9

## 2022-12-07 HISTORY — DX: Atherosclerotic heart disease of native coronary artery without angina pectoris: I25.10

## 2022-12-07 HISTORY — DX: Abnormal result of other cardiovascular function study: R94.39

## 2022-12-07 HISTORY — DX: Elevated prostate specific antigen (PSA): R97.20

## 2022-12-07 HISTORY — DX: Other intervertebral disc degeneration, lumbar region without mention of lumbar back pain or lower extremity pain: M51.369

## 2022-12-07 HISTORY — DX: Other nonspecific abnormal finding of lung field: R91.8

## 2022-12-07 HISTORY — DX: Hypokalemia: E87.6

## 2022-12-07 HISTORY — DX: Malignant neoplasm of unspecified part of right bronchus or lung: C34.91

## 2022-12-07 HISTORY — DX: Pleural effusion, not elsewhere classified: J90

## 2022-12-07 HISTORY — DX: Umbilical hernia without obstruction or gangrene: K42.9

## 2022-12-07 HISTORY — DX: Obesity, unspecified: E66.9

## 2022-12-07 HISTORY — DX: Liver disease, unspecified: K76.9

## 2022-12-07 HISTORY — DX: Personal history of other diseases of the digestive system: Z87.19

## 2022-12-07 HISTORY — DX: Other intervertebral disc degeneration, lumbar region: M51.36

## 2022-12-07 NOTE — Patient Instructions (Signed)
Your procedure is scheduled on:12-17-22 Monday Report to the Registration Desk on the 1st floor of the [facility].Then proceed to the 2nd floor Surgery Desk To find out your arrival time, please call [phone] between 1PM - 3PM on:12-14-22 Friday If your arrival time is 6:00 am, do not arrive before that time as the [facility] entrance doors do not open until 6:00 am.  REMEMBER: Instructions that are not followed completely may result in serious medical risk, up to and including death; or upon the discretion of your surgeon and anesthesiologist your surgery may need to be rescheduled.  Do not eat food after midnight the night before surgery.  No gum chewing or hard candies.  You may however, drink CLEAR liquids up to 2 hours before you are scheduled to arrive for your surgery. Do not drink anything within 2 hours of your scheduled arrival time.  Clear liquids include: - water  - apple juice without pulp - gatorade (not RED colors) - black coffee or tea (Do NOT add milk or creamers to the coffee or tea) Do NOT drink anything that is not on this list.  One week prior to surgery:Last dose will be on 12-09-22 Stop Anti-inflammatories (NSAIDS) such as Advil, Aleve, Ibuprofen, Motrin, Naproxen, Naprosyn and Aspirin based products such as Excedrin, Goody's Powder, BC Powder.You may however, take Tylenol if needed for pain up until the day of surgery. Stop ANY OVER THE COUNTER supplements/vitamins 7 days prior to surgery (Vitamin C and D)   Continue taking all prescribed medications with the exception of the following: -Stop your 81 mg Aspirin 7 days prior to surgery-Last dose will be on 12-09-22 Sunday -Stop your sildenafil (VIAGRA) 2 days prior to surgery-Last dose will be on 12-14-22 Friday  TAKE ONLY THESE MEDICATIONS THE MORNING OF SURGERY WITH A SIP OF WATER: -amLODipine (NORVASC)  -carvedilol (COREG)  -potassium chloride (KLOR-CON)  -tamsulosin (FLOMAX)  -Vibegron (GEMTESA)  -You  may take LORazepam (ATIVAN) if needed for anxiety  Continue your lisinopril (PRINIVIL,ZESTRIL) and torsemide (DEMADEX) up until the day prior to surgery-Do NOT take the morning of your surgery  Use your ipratropium-albuterol (DUONEB) and your Fluticasone-Umeclidin-Vilant (TRELEGY ELLIPTA) Inhaler the morning of surgery and bring your  albuterol (VENTOLIN HFA) Inhaler to the hospital  No Alcohol for 24 hours before or after surgery.  No Smoking including e-cigarettes for 24 hours before surgery.  No chewable tobacco products for at least 6 hours before surgery.  No nicotine patches on the day of surgery.  Do not use any "recreational" drugs for at least a week (preferably 2 weeks) before your surgery.  Please be advised that the combination of cocaine and anesthesia may have negative outcomes, up to and including death. If you test positive for cocaine, your surgery will be cancelled.  On the morning of surgery brush your teeth with toothpaste and water, you may rinse your mouth with mouthwash if you wish. Do not swallow any toothpaste or mouthwash  Do not wear jewelry, make-up, hairpins, clips or nail polish.  Do not wear lotions, powders, or perfumes.   Do not shave body hair from the neck down 48 hours before surgery.  Contact lenses, hearing aids and dentures may not be worn into surgery.  Do not bring valuables to the hospital. [facility] is not responsible for any missing/lost belongings or valuables.   Notify your doctor if there is any change in your medical condition (cold, fever, infection).  Wear comfortable clothing (specific to your surgery  type) to the hospital.  After surgery, you can help prevent lung complications by doing breathing exercises.  Take deep breaths and cough every 1-2 hours. Your doctor may order a device called an Incentive Spirometer to help you take deep breaths. When coughing or sneezing, hold a pillow firmly against your incision with both hands.  This is called "splinting." Doing this helps protect your incision. It also decreases belly discomfort.  If you are being admitted to the hospital overnight, leave your suitcase in the car. After surgery it may be brought to your room.  In case of increased patient census, it may be necessary for you, the patient, to continue your postoperative care in the Same Day Surgery department.  If you are being discharged the day of surgery, you will not be allowed to drive home. You will need a responsible individual to drive you home and stay with you for 24 hours after surgery.   If you are taking public transportation, you will need to have a responsible individual with you.  Please call the Pre-admissions Testing Dept. at [phone] if you have any questions about these instructions.  Surgery Visitation Policy:  Patients having surgery or a procedure may have two visitors.  Children under the age of 15 must have an adult with them who is not the patient.

## 2022-12-10 ENCOUNTER — Encounter: Payer: Self-pay | Admitting: Urgent Care

## 2022-12-10 ENCOUNTER — Inpatient Hospital Stay: Admission: RE | Admit: 2022-12-10 | Payer: Medicare Other | Source: Ambulatory Visit

## 2022-12-11 ENCOUNTER — Encounter
Admission: RE | Admit: 2022-12-11 | Discharge: 2022-12-11 | Disposition: A | Discharge status: Home / Self Care | Payer: Medicare Other | Source: Ambulatory Visit | Attending: Pulmonary Disease | Admitting: Pulmonary Disease

## 2022-12-11 ENCOUNTER — Encounter: Payer: Self-pay | Admitting: Urgent Care

## 2022-12-11 DIAGNOSIS — I152 Hypertension secondary to endocrine disorders: Secondary | ICD-10-CM | POA: Diagnosis not present

## 2022-12-11 DIAGNOSIS — E1159 Type 2 diabetes mellitus with other circulatory complications: Secondary | ICD-10-CM | POA: Insufficient documentation

## 2022-12-11 DIAGNOSIS — Z01818 Encounter for other preprocedural examination: Secondary | ICD-10-CM | POA: Diagnosis not present

## 2022-12-11 DIAGNOSIS — R0602 Shortness of breath: Secondary | ICD-10-CM | POA: Diagnosis not present

## 2022-12-11 LAB — PROTIME-INR
INR: 1 (ref 0.8–1.2)
Prothrombin Time: 13.6 seconds (ref 11.4–15.2)

## 2022-12-11 LAB — APTT: aPTT: 32 seconds (ref 24–36)

## 2022-12-12 DIAGNOSIS — Z510 Encounter for antineoplastic radiation therapy: Secondary | ICD-10-CM | POA: Diagnosis not present

## 2022-12-13 ENCOUNTER — Ambulatory Visit
Admission: RE | Admit: 2022-12-13 | Discharge: 2022-12-13 | Disposition: A | Discharge status: Home / Self Care | Payer: Medicare Other | Source: Ambulatory Visit | Attending: Radiation Oncology | Admitting: Radiation Oncology

## 2022-12-13 ENCOUNTER — Encounter: Payer: Self-pay | Admitting: Urgent Care

## 2022-12-13 NOTE — Progress Notes (Signed)
Perioperative / Anesthesia Services  Pre-Admission Testing Clinical Review / Preoperative Anesthesia Consult  Date: 12/14/22  Patient Demographics:  Name: Mark Mccarty DOB:   [birth year] MRN:   [id number]  Planned Surgical Procedure(s):    Case: [id number] Date/Time: 12/17/22 1230   Procedures:      VIDEO BRONCHOSCOPY WITH ENDOBRONCHIAL ULTRASOUND     ROBOTIC ASSISTED NAVIGATIONAL BRONCHOSCOPY   Anesthesia type: General   Pre-op diagnosis: J91.0, C34.91, R91.8   Location: [facility] PROCEDURE RM 02 / [name] FOR ANESTHESIA GROUP   Surgeons: Vida Rigger, MD     NOTE: Available PAT nursing documentation and vital signs have been reviewed. Clinical nursing staff has updated patient's PMH/PSHx, current medication list, and drug allergies/intolerances to ensure comprehensive history available to assist in medical decision making as it pertains to the aforementioned surgical procedure and anticipated anesthetic course. Extensive review of available clinical information personally performed. [facility] and PSHx updated with any diagnoses/procedures that  may have been inadvertently omitted during his intake with the pre-admission testing department's nursing staff.  Clinical Discussion:  Mark Mccarty is a 72 y.o. male who is submitted for pre-surgical anesthesia review and clearance prior to him undergoing the above procedure. Patient is a Former Smoker (52.5 pack years; quit 06/2015). Pertinent PMH includes: CAD, diastolic dysfunction, HTN, HLD, T2DM, asthma, COPD, adenocarcinoma of the RIGHT lung, PAD, GERD (no daily Tx), [name], hiatal hernia, gastritis, dysphagia, hilar mass, BPH, ED (on PDE5i), peripheral neuropathy, lumbar DDD, anxiety.  Patient is followed by cardiology Darrold Junker, MD). He was last seen in the cardiology clinic on 10/16/2022; notes reviewed. At the time of his clinic visit, patient doing well overall from a cardiovascular perspective.  Patient reported mild chronic  exertional dyspnea that was reported to be stable and at baseline.  Patient denied any chest pain, PND, orthopnea, palpitations, significant peripheral edema, weakness, fatigue, vertiginous symptoms, or presyncope/syncope. Patient with a past medical history significant for cardiovascular diagnoses. Documented physical exam was grossly benign, providing no evidence of acute exacerbation and/or decompensation of the patient's known cardiovascular conditions.  Diagnostic LEFT heart catheterization was performed on 12/16/2014 revealing a normal left ventricular systolic function with an EF of >55%.  Near normal coronary anatomy was demonstrated.  There was a single 20% stenosis noted within the mid RCA.  Given the nonobstructive nature of patient's coronary artery disease, the decision was made to defer intervention opting for medical management.  Most recent TTE was performed on 04/23/2022 revealing a normal left ventricular systolic function with an EF of >55%.  There were no regional wall motion abnormalities.  Left atrium was mildly enlarged.  Right ventricular size and function normal.  There was trivial tricuspid, mild pulmonary, and moderate atrial and mitral valve regurgitation.  All transvalvular gradients were noted to be normal providing no evidence suggestive of valvular stenosis.  Aorta normal in size with no evidence of aneurysmal dilatation.  Blood pressure well controlled at 120/72 mmHg on currently prescribed beta-blocker (carvedilol), CCB (amlodipine), diuretic (torsemide), and ACEi (lisinopril) therapies.  Patient is on pravastatin for his HLD diagnosis and ASCVD prevention.  In the setting of known cardiovascular diagnoses, it is important to note that patient is on a PDE5i (send beneficial) for erectile dysfunction diagnosis.  T2DM well-controlled on currently prescribed regimen; last HgbA1c was 6.1% when checked on 09/19/2022.  Patient does not have an OSAH diagnosis.  Patient making efforts  to remain active by doing yard work and using his home gym equipment 2-3 times  a week.  Patient is able to complete all of his ADLs/IADLs independently.  Per the [facility], patient's functional capacity places him at the ability to achieve >4 METS of physical activity without experiencing any significant degree of angina/anginal equivalent symptoms.  No changes were made to his medication regimen.  Patient to follow-up with outpatient cardiology in 6 months or sooner if needed.  Mark Mccarty was diagnosed with stage IIIa (cT3, cN1, cM0) adenocarcinoma of the RIGHT lung back in 2019.  He was treated with concurrent chemoradiation using carboplatin plus paclitaxel (12/31/2017 - 02/19/2018) followed by 1 year (03/24/2018 - 03/17/2019 adjuvant anti-PDL-1 monoclonal antibody immunotherapy (durvalumab).  Recent CT findings concerning for recurrence of what would clinically be stage IV disease.  Follow-up PET imaging performed on 12/03/2022 revealed a 3.5 cm hypermetabolic RIGHT hilar mass (SUV max 8.5) consistent with a bronchogenic carcinoma.  Patient with postobstructive changes and a large RIGHT pleural effusion. There was also a 22 mm hypermetabolic nodule (SUV max 9.5) appreciated in the superior segment of the LEFT lower lobe consistent with synchronous bronchogenic carcinoma versus a metastatic lesion.  Patient with hypermetabolic mediastinal and subcarinal adenopathy consistent with nodal metastasis.  Patient has been referred to pulmonary medicine Karna Christmas, MD) for further evaluation and tissue biopsy for the purposes of obtaining a definitive diagnosis.  Patient has been scheduled for a VIDEO BRONCHOSCOPY WITH ENDOBRONCHIAL ULTRASOUND ON 12/17/2022 with Dr. Vida Rigger, MD. Given patient's past medical history significant for cardiovascular diagnoses, presurgical cardiac clearance was sought by the PAT team. Per cardiology, "this patient is optimized for surgery and may proceed with the planned procedural  course with a LOW risk of significant perioperative cardiovascular complications".  In review of his medication reconciliation, it is noted that patient is currently on prescribed daily antithrombotic therapy. He has been instructed on recommendations for holding his daily low-dose ASA for 7 days prior to his procedure with plans to restart as soon as postoperative bleeding risk felt to be minimized by his attending surgeon. The patient has been instructed that his last dose of his ASA should be on 12/09/2022.  Patient denies previous perioperative complications with anesthesia in the past. In review of the available records, it is noted that patient underwent a general anesthetic course here at [facility] ([facility] in 12/2019 without documented complications.      12/07/2022   10:00 AM 11/29/2022    1:26 PM 11/21/2022   10:25 AM  Vitals with BMI  Height 6\' 0"  6\' 0"    Weight 216 lbs 4 oz 216 lbs 5 oz 215 lbs 11 oz  BMI 29.33 29.33 29.25  Systolic  144 137  Diastolic  75 77  Pulse  81 71    Providers/Specialists:   NOTE: Primary physician provider listed below. Patient may have been seen by APP or partner within same practice.   PROVIDER ROLE / SPECIALTY LAST Yolanda Manges, MD Pulmonary Medicine (Surgeon) 11/29/2022  [name], NP Primary Care Provider 10/25/2022  Marcina Millard, MD Cardiology 10/16/2022  [name], MD Medical Oncology 11/21/2022  Genelle Bal, MD Radiation Oncology 11/29/2022   Allergies:  Penicillins  Current Home Medications:   No current facility-administered medications for this encounter.    acetaminophen (TYLENOL) 500 MG tablet   albuterol (VENTOLIN HFA) 108 (90 Base) MCG/ACT inhaler   amLODipine (NORVASC) 10 MG tablet   ascorbic acid (VITAMIN C) 1000 MG tablet   aspirin 81 MG tablet   carvedilol (COREG)  25 MG tablet   cetirizine (ZYRTEC) 10 MG tablet   cholecalciferol (VITAMIN D3) 25 MCG (1000  UNIT) tablet   Fluticasone-Umeclidin-Vilant (TRELEGY ELLIPTA) 200-62.5-25 MCG/ACT AEPB   gabapentin (NEURONTIN) 300 MG capsule   ibuprofen (ADVIL,MOTRIN) 600 MG tablet   ipratropium-albuterol (DUONEB) 0.5-2.5 (3) MG/3ML SOLN   lidocaine-prilocaine (EMLA) cream   lisinopril (PRINIVIL,ZESTRIL) 40 MG tablet   LORazepam (ATIVAN) 0.5 MG tablet   potassium chloride (KLOR-CON) 10 MEQ tablet   pravastatin (PRAVACHOL) 20 MG tablet   senna (SENOKOT) 8.6 MG TABS tablet   sildenafil (VIAGRA) 50 MG tablet   tamsulosin (FLOMAX) 0.4 MG CAPS capsule   torsemide (DEMADEX) 20 MG tablet   Vibegron (GEMTESA) 75 MG TABS    sodium chloride flush (NS) 0.9 % injection 10 mL   History:   Past Medical History:  Diagnosis Date   Abnormal nuclear stress test    Anxiety    a.) on BZO (lorazepam) PRN   Asthma    Avascular necrosis of femoral head (HCC)    BPH (benign prostatic hyperplasia)    Cataract    a.) s/p extraction on LEFT   Colon adenomas    COPD (chronic obstructive pulmonary disease) (HCC)    Coronary artery disease 11/11/2014   a.) MPI 11/11/2014: EF 56%, mild inf wall ischemia; b.) LHC 12/16/2014: 20% mRCA - med mgmt   DDD (degenerative disc disease), lumbar    Diverticulosis    DM type 2 (diabetes mellitus, type 2) (HCC)    Duodenitis    Dysphagia    Dyspnea    ED (erectile dysfunction)    a.) on PDE5i (sildenafil) PRN   Elevated PSA    Former smoker    Gastritis    GERD (gastroesophageal reflux disease)    [name]    Hilar mass    History of hiatal hernia    Hyperlipidemia    Hypertension    Hyperuricemia    Hypokalemia    Left ventricular diastolic dysfunction 10/04/2016   a.) TTE 10/04/2016: EF 50%, mild MR/TR/PR, mod AR, G1DD; b.) TTE 10/10/2017: EF 45%, triv PR, mild AR/MR/TR, G1DD; c.) TTE 03/30/2020: EF 50%, mild-mod AR, mild MR/TR/PR; d.) TTE 04/23/2022: EF >55%, mild LVH, mild LAE, triv TR, mild PR, mod AR/MR   Liver lesion    Obesity    Pedal  edema    Peripheral polyneuropathy    Peyronie's disease 05/2022   Pleural effusion    Primary lung adenocarcinoma, right (HCC)    a.) stage IIIA (cT3, cN1, cM0) --> s/p concurrent chemoradiation 2019 --> carboplatin + pacilitaxel (12/31/2017 - 02/19/2018) followed by 1 year (03/24/2018 - 03/17/2019) adjuvant anti-PDL-1 mAB immunotherapy (durvalumab)   [name] (peptic ulcer disease)    Renal insufficiency    Umbilical hernia    [name]    Past Surgical History:  Procedure Laterality Date   CARDIAC CATHETERIZATION N/A 12/16/2014   Procedure: Left Heart Cath;  Surgeon: Marcina Millard, MD;  Location: [facility] INVASIVE CV LAB;  Service: Cardiovascular;  Laterality: N/A;   CATARACT EXTRACTION Left    COLONOSCOPY     COLONOSCOPY WITH PROPOFOL N/A 01/14/2020   Procedure: COLONOSCOPY WITH PROPOFOL;  Surgeon: Toledo, Boykin Nearing, MD;  Location: [facility] ENDOSCOPY;  Service: Gastroenterology;  Laterality: N/A;   CYST REMOVAL TRUNK     chest and back over time and it was removed   CYSTECTOMY     ENDOBRONCHIAL ULTRASOUND N/A 12/09/2017   Procedure: ENDOBRONCHIAL ULTRASOUND;  Surgeon: Shane Crutch, MD;  Location: [name];  Service: Pulmonary;  Laterality: N/A;   PORTA CATH INSERTION N/A 12/23/2017   Procedure: PORTA CATH INSERTION;  Surgeon: Annice Needy, MD;  Location: [facility] INVASIVE CV LAB;  Service: Cardiovascular;  Laterality: N/A;   PORTA CATH REMOVAL N/A 06/04/2022   Procedure: PORTA CATH REMOVAL;  Surgeon: Annice Needy, MD;  Location: [facility] INVASIVE CV LAB;  Service: Cardiovascular;  Laterality: N/A;   UPPER GI ENDOSCOPY     Family History  Problem Relation Age of Onset   Hypertension Mother    Breast cancer Mother    Heart attack Mother    Lung cancer Father    Heart disease Sister    Diabetes Sister    Colon cancer Sister    Social History   Tobacco Use   Smoking status: Former    Packs/day: 1.50    Years: 35.00    Additional pack years: 0.00    Total pack  years: 52.50    Types: Cigarettes    Quit date: 06/22/2015    Years since quitting: 7.4    Passive exposure: Past   Smokeless tobacco: Never  Vaping Use   Vaping Use: Never used  Substance Use Topics   Alcohol use: Not Currently    Comment: weekends   Drug use: No    Pertinent Clinical Results:  LABS:  Lab Results  Component Value Date   WBC 15.3 (H) 11/21/2022   HGB 15.3 11/21/2022   HCT 45.7 11/21/2022   MCV 85.3 11/21/2022   PLT 270 11/21/2022   Lab Results  Component Value Date   NA 139 11/21/2022   K 3.7 11/21/2022   CO2 29 11/21/2022   GLUCOSE 106 (H) 11/21/2022   BUN 12 11/21/2022   CREATININE 0.77 11/21/2022   CALCIUM 9.2 11/21/2022   GFRNONAA >60 11/21/2022   No visits with results within 3 Day(s) from this visit.  Latest known visit with results is:  Hospital Outpatient Visit on 12/11/2022  Component Date Value Ref Range Status   Prothrombin Time 12/11/2022 13.6  11.4 - 15.2 seconds Final   INR 12/11/2022 1.0  0.8 - 1.2 Final   Comment: (NOTE) INR goal varies based on device and disease states. Performed at [facility], [address] [place], [place] [address]    aPTT 12/11/2022 32  24 - 36 seconds Final   Performed at [facility], [address] [place], [place] [address]    ECG: Date: 12/11/2022 Time ECG obtained: 1115 AM Rate: 79 bpm Rhythm: normal sinus Axis (leads I and aVF): Normal Intervals: PR 188 ms. QRS 96 ms. QTc 488 ms. ST segment and T wave changes: No evidence of acute ST segment elevation or depression Comparison: Similar to previous tracing obtained on 12/06/2017   IMAGING / PROCEDURES: LEFT HEART CATHETERIZATION AND CORONARY ANGIOGRAPHY performed on 12/16/2014 Normal left ventricular systolic function and EF No regional wall motion abnormalities 20% stenosis in the mid RCA Recommendations: medical management    NM PET IMAGE RESTAG (PS) SKULL BASE TO THIGH performed on 12/03/2022 Hypermetabolic  mass at the RIGHT hilum consistent with bronchogenic carcinoma. Postobstructive collapse of the RIGHT upper lobe. Uniform hypermetabolic pleural thickening in the RIGHT lung consistent with pleural metastasis Large RIGHT effusion Hypermetabolic nodule in the superior segment of the LEFT lower lobe consistent with pulmonary metastasis. Synchronous bronchogenic carcinoma versus metastatic lesion. Hypermetabolic bilateral mediastinal adenopathy consistent with nodal metastasis. No evidence of metastatic disease outside the thorax. No skeletal metastasis.  CT  CHEST ABDOMEN PELVIS W CONTRAST performed on 11/16/2022 New moderate right pleural effusion with diffuse, nodular, enhancing pleural thickening consistent with pleural metastatic disease. Proximal obstruction of the right upper lobe segmental bronchi with complete atelectasis or consolidation of the right upper lobe, consistent with local malignant recurrence and postobstructive atelectasis of the upper lobe. Interval increase in circumferential soft tissue thickening about the right hilum. Slight interval increase in size in enlarged AP window lymph nodes. Interval enlargement of a subpleural mass of the dependent superior segment left lower lobe consistent with an enlarging pulmonary metastasis or metachronous primary lung malignancy. Emphysema. Coronary artery disease. Prostatomegaly.  Impression and Plan:  Mark Mccarty has been referred for pre-anesthesia review and clearance prior to him undergoing the planned anesthetic and procedural courses. Available labs, pertinent testing, and imaging results were personally reviewed by me in preparation for upcoming operative/procedural course. [facility] medical record has been updated following extensive record review and patient interview with PAT staff.   This patient has been appropriately cleared by cardiology with an overall LOW risk of experiencing significant perioperative  cardiovascular complications. Based on clinical review performed today (12/14/22), barring any significant acute changes in the patient's overall condition, it is anticipated that he will be able to proceed with the planned surgical intervention. Any acute changes in clinical condition may necessitate his procedure being postponed and/or cancelled. Patient will meet with anesthesia team (MD and/or CRNA) on the day of his procedure for preoperative evaluation/assessment. Questions regarding anesthetic course will be fielded at that time.   Pre-surgical instructions were reviewed with the patient during his PAT appointment, and questions were fielded to satisfaction by PAT clinical staff. He has been instructed on which medications that he will need to hold prior to surgery, as well as the ones that have been deemed safe/appropriate to take on the day of his procedure. As part of the general education provided by PAT, patient made aware both verbally and in writing, that he would need to abstain from the use of any illegal substances during his perioperative course.  He was advised that failure to follow the provided instructions could necessitate case cancellation or result in serious perioperative complications up to and including death. Patient encouraged to contact PAT and/or his surgeon's office to discuss any questions or concerns that may arise prior to surgery; verbalized understanding.   Quentin Mulling, MSN, APRN, FNP-C, CEN [facility]  Peri-operative Services Nurse Practitioner Phone: [phone] Fax: [phone] 12/14/22 12:50 PM  NOTE: This note has been prepared using Dragon dictation software. Despite my best ability to proofread, there is always the potential that unintentional transcriptional errors may still occur from this process.

## 2022-12-14 ENCOUNTER — Encounter: Payer: Self-pay | Admitting: Urgent Care

## 2022-12-14 ENCOUNTER — Ambulatory Visit
Admission: RE | Admit: 2022-12-14 | Discharge: 2022-12-14 | Disposition: A | Discharge status: Home / Self Care | Payer: Medicare Other | Source: Ambulatory Visit | Attending: Pulmonary Disease | Admitting: Pulmonary Disease

## 2022-12-14 DIAGNOSIS — R918 Other nonspecific abnormal finding of lung field: Secondary | ICD-10-CM

## 2022-12-14 DIAGNOSIS — C3491 Malignant neoplasm of unspecified part of right bronchus or lung: Secondary | ICD-10-CM | POA: Diagnosis present

## 2022-12-14 DIAGNOSIS — J910 Malignant pleural effusion: Secondary | ICD-10-CM | POA: Insufficient documentation

## 2022-12-16 MED ORDER — SODIUM CHLORIDE 0.9 % IV SOLN: INTRAVENOUS | Status: DC

## 2022-12-16 MED ORDER — FAMOTIDINE 20 MG PO TABS
20.0000 mg | ORAL_TABLET | Freq: Once | ORAL | Status: AC
  Administered 2022-12-17: 20 mg via ORAL

## 2022-12-16 MED ORDER — ORAL CARE MOUTH RINSE: 15.0000 mL | Freq: Once | OROMUCOSAL | Status: AC

## 2022-12-16 MED ORDER — CHLORHEXIDINE GLUCONATE 0.12 % MT SOLN
15.0000 mL | Freq: Once | OROMUCOSAL | Status: AC
  Administered 2022-12-17: 15 mL via OROMUCOSAL

## 2022-12-17 ENCOUNTER — Ambulatory Visit: Payer: Medicare Other | Admitting: Urgent Care

## 2022-12-17 ENCOUNTER — Ambulatory Visit: Payer: Medicare Other

## 2022-12-17 ENCOUNTER — Observation Stay
Admission: RE | Admit: 2022-12-17 | Discharge: 2022-12-18 | Disposition: A | Discharge status: Home / Self Care | Payer: Medicare Other | Attending: Osteopathic Medicine | Admitting: Osteopathic Medicine

## 2022-12-17 ENCOUNTER — Encounter
Admission: RE | Disposition: A | Discharge status: Home / Self Care | Payer: Self-pay | Source: Home / Self Care | Attending: Internal Medicine

## 2022-12-17 ENCOUNTER — Ambulatory Visit
Admission: RE | Admit: 2022-12-17 | Discharge: 2022-12-17 | Disposition: A | Discharge status: Home / Self Care | Payer: Medicare Other | Source: Home / Self Care | Attending: Pulmonary Disease | Admitting: Pulmonary Disease

## 2022-12-17 ENCOUNTER — Other Ambulatory Visit: Payer: Self-pay

## 2022-12-17 DIAGNOSIS — J4489 Other specified chronic obstructive pulmonary disease: Secondary | ICD-10-CM | POA: Insufficient documentation

## 2022-12-17 DIAGNOSIS — E785 Hyperlipidemia, unspecified: Secondary | ICD-10-CM | POA: Insufficient documentation

## 2022-12-17 DIAGNOSIS — J9621 Acute and chronic respiratory failure with hypoxia: Secondary | ICD-10-CM | POA: Insufficient documentation

## 2022-12-17 DIAGNOSIS — Z85118 Personal history of other malignant neoplasm of bronchus and lung: Secondary | ICD-10-CM | POA: Insufficient documentation

## 2022-12-17 DIAGNOSIS — J45909 Unspecified asthma, uncomplicated: Secondary | ICD-10-CM | POA: Diagnosis not present

## 2022-12-17 DIAGNOSIS — J910 Malignant pleural effusion: Secondary | ICD-10-CM | POA: Insufficient documentation

## 2022-12-17 DIAGNOSIS — J984 Other disorders of lung: Secondary | ICD-10-CM | POA: Insufficient documentation

## 2022-12-17 DIAGNOSIS — I10 Essential (primary) hypertension: Secondary | ICD-10-CM | POA: Diagnosis not present

## 2022-12-17 DIAGNOSIS — Z87891 Personal history of nicotine dependence: Secondary | ICD-10-CM | POA: Diagnosis not present

## 2022-12-17 DIAGNOSIS — J90 Pleural effusion, not elsewhere classified: Secondary | ICD-10-CM | POA: Diagnosis present

## 2022-12-17 DIAGNOSIS — Z01818 Encounter for other preprocedural examination: Secondary | ICD-10-CM

## 2022-12-17 DIAGNOSIS — I2510 Atherosclerotic heart disease of native coronary artery without angina pectoris: Secondary | ICD-10-CM | POA: Insufficient documentation

## 2022-12-17 DIAGNOSIS — J432 Centrilobular emphysema: Secondary | ICD-10-CM | POA: Diagnosis not present

## 2022-12-17 DIAGNOSIS — Z8711 Personal history of peptic ulcer disease: Secondary | ICD-10-CM | POA: Insufficient documentation

## 2022-12-17 DIAGNOSIS — Z9889 Other specified postprocedural states: Secondary | ICD-10-CM

## 2022-12-17 DIAGNOSIS — C3411 Malignant neoplasm of upper lobe, right bronchus or lung: Secondary | ICD-10-CM | POA: Diagnosis not present

## 2022-12-17 DIAGNOSIS — E119 Type 2 diabetes mellitus without complications: Secondary | ICD-10-CM | POA: Insufficient documentation

## 2022-12-17 DIAGNOSIS — Z01810 Encounter for preprocedural cardiovascular examination: Secondary | ICD-10-CM | POA: Diagnosis not present

## 2022-12-17 DIAGNOSIS — N400 Enlarged prostate without lower urinary tract symptoms: Secondary | ICD-10-CM | POA: Insufficient documentation

## 2022-12-17 HISTORY — PX: VIDEO BRONCHOSCOPY WITH ENDOBRONCHIAL ULTRASOUND: SHX6177

## 2022-12-17 HISTORY — DX: Chronic obstructive pulmonary disease, unspecified: J44.9

## 2022-12-17 LAB — COMPREHENSIVE METABOLIC PANEL
ALT: 13 U/L (ref 0–44)
AST: 16 U/L (ref 15–41)
Albumin: 3.3 g/dL — ABNORMAL LOW (ref 3.5–5.0)
Alkaline Phosphatase: 58 U/L (ref 38–126)
Anion gap: 8 (ref 5–15)
BUN: 11 mg/dL (ref 8–23)
CO2: 29 mmol/L (ref 22–32)
Calcium: 8.1 mg/dL — ABNORMAL LOW (ref 8.9–10.3)
Chloride: 101 mmol/L (ref 98–111)
Creatinine, Ser: 0.82 mg/dL (ref 0.61–1.24)
GFR, Estimated: >60 mL/min (ref >60)
Glucose, Bld: 141 mg/dL — ABNORMAL HIGH (ref 70–99)
Potassium: 3.6 mmol/L (ref 3.5–5.1)
Sodium: 138 mmol/L (ref 135–145)
Total Bilirubin: 0.6 mg/dL (ref 0.3–1.2)
Total Protein: 6.4 g/dL — ABNORMAL LOW (ref 6.5–8.1)

## 2022-12-17 LAB — GLUCOSE, CAPILLARY
Glucose-Capillary: 132 mg/dL — ABNORMAL HIGH (ref 70–99)
Glucose-Capillary: 150 mg/dL — ABNORMAL HIGH (ref 70–99)
Glucose-Capillary: 174 mg/dL — ABNORMAL HIGH (ref 70–99)

## 2022-12-17 LAB — CBC
HCT: 45 % (ref 39.0–52.0)
Hemoglobin: 14.9 g/dL (ref 13.0–17.0)
MCH: 28.3 pg (ref 26.0–34.0)
MCHC: 33.1 g/dL (ref 30.0–36.0)
MCV: 85.6 fL (ref 80.0–100.0)
Platelets: 359 10*3/uL (ref 150–400)
RBC: 5.26 MIL/uL (ref 4.22–5.81)
RDW: 13.3 % (ref 11.5–15.5)
WBC: 8.3 10*3/uL (ref 4.0–10.5)
nRBC: 0 % (ref 0.0–0.2)

## 2022-12-17 LAB — CREATININE, SERUM
Creatinine, Ser: 0.81 mg/dL (ref 0.61–1.24)
GFR, Estimated: >60 mL/min (ref >60)

## 2022-12-17 SURGERY — BRONCHOSCOPY, WITH EBUS
Anesthesia: General

## 2022-12-17 MED ORDER — IPRATROPIUM-ALBUTEROL 0.5-2.5 (3) MG/3ML IN SOLN
RESPIRATORY_TRACT | Status: AC
  Filled 2022-12-17: qty 6

## 2022-12-17 MED ORDER — LORAZEPAM 0.5 MG PO TABS: 0.5000 mg | ORAL_TABLET | Freq: Two times a day (BID) | ORAL | Status: DC | PRN

## 2022-12-17 MED ORDER — AMLODIPINE BESYLATE 10 MG PO TABS
10.0000 mg | ORAL_TABLET | Freq: Every day | ORAL | Status: DC
  Administered 2022-12-18: 10 mg via ORAL
  Filled 2022-12-17: qty 1

## 2022-12-17 MED ORDER — SENNA 8.6 MG PO TABS: 2.0000 | ORAL_TABLET | ORAL | Status: DC | PRN

## 2022-12-17 MED ORDER — PRAVASTATIN SODIUM 20 MG PO TABS
20.0000 mg | ORAL_TABLET | Freq: Every day | ORAL | Status: DC
  Administered 2022-12-17: 20 mg via ORAL
  Filled 2022-12-17: qty 1

## 2022-12-17 MED ORDER — ALBUTEROL SULFATE HFA 108 (90 BASE) MCG/ACT IN AERS
INHALATION_SPRAY | RESPIRATORY_TRACT | Status: DC | PRN
  Administered 2022-12-17 (×2): 4 via RESPIRATORY_TRACT

## 2022-12-17 MED ORDER — SUGAMMADEX SODIUM 200 MG/2ML IV SOLN
INTRAVENOUS | Status: DC | PRN
  Administered 2022-12-17: 300 mg via INTRAVENOUS

## 2022-12-17 MED ORDER — DEXAMETHASONE SODIUM PHOSPHATE 10 MG/ML IJ SOLN
INTRAMUSCULAR | Status: DC | PRN
  Administered 2022-12-17: 10 mg via INTRAVENOUS

## 2022-12-17 MED ORDER — GLYCOPYRROLATE 0.2 MG/ML IJ SOLN
INTRAMUSCULAR | Status: DC | PRN
  Administered 2022-12-17: .2 mg via INTRAVENOUS

## 2022-12-17 MED ORDER — UMECLIDINIUM BROMIDE 62.5 MCG/ACT IN AEPB
1.0000 | INHALATION_SPRAY | Freq: Every day | RESPIRATORY_TRACT | Status: DC
  Administered 2022-12-18: 1 via RESPIRATORY_TRACT
  Filled 2022-12-17: qty 7

## 2022-12-17 MED ORDER — GABAPENTIN 300 MG PO CAPS: 300.0000 mg | ORAL_CAPSULE | Freq: Every evening | ORAL | Status: DC | PRN

## 2022-12-17 MED ORDER — ALBUTEROL SULFATE (2.5 MG/3ML) 0.083% IN NEBU: 3.0000 mL | INHALATION_SOLUTION | RESPIRATORY_TRACT | Status: DC | PRN

## 2022-12-17 MED ORDER — ALBUTEROL SULFATE (2.5 MG/3ML) 0.083% IN NEBU
INHALATION_SOLUTION | RESPIRATORY_TRACT | Status: AC
  Filled 2022-12-17: qty 3

## 2022-12-17 MED ORDER — TORSEMIDE 20 MG PO TABS
20.0000 mg | ORAL_TABLET | Freq: Every day | ORAL | Status: DC
  Administered 2022-12-18: 20 mg via ORAL
  Filled 2022-12-17: qty 1

## 2022-12-17 MED ORDER — IPRATROPIUM-ALBUTEROL 0.5-2.5 (3) MG/3ML IN SOLN
RESPIRATORY_TRACT | Status: AC
  Filled 2022-12-17: qty 3

## 2022-12-17 MED ORDER — ROCURONIUM BROMIDE 100 MG/10ML IV SOLN
INTRAVENOUS | Status: DC | PRN
  Administered 2022-12-17: 50 mg via INTRAVENOUS

## 2022-12-17 MED ORDER — LIDOCAINE HCL (PF) 1 % IJ SOLN
10.0000 mL | Freq: Once | INTRAMUSCULAR | Status: AC
  Administered 2022-12-17: 10 mL via INTRADERMAL
  Filled 2022-12-17: qty 10

## 2022-12-17 MED ORDER — ENOXAPARIN SODIUM 40 MG/0.4ML IJ SOSY
40.0000 mg | PREFILLED_SYRINGE | INTRAMUSCULAR | Status: DC
  Administered 2022-12-18: 40 mg via SUBCUTANEOUS
  Filled 2022-12-17: qty 0.4

## 2022-12-17 MED ORDER — LISINOPRIL 20 MG PO TABS
40.0000 mg | ORAL_TABLET | Freq: Every day | ORAL | Status: DC
  Administered 2022-12-18: 40 mg via ORAL
  Filled 2022-12-17: qty 2

## 2022-12-17 MED ORDER — IPRATROPIUM-ALBUTEROL 0.5-2.5 (3) MG/3ML IN SOLN
3.0000 mL | Freq: Once | RESPIRATORY_TRACT | Status: AC
  Administered 2022-12-17: 3 mL via RESPIRATORY_TRACT

## 2022-12-17 MED ORDER — ACETAMINOPHEN 160 MG/5ML PO SOLN: 325.0000 mg | ORAL | Status: DC | PRN

## 2022-12-17 MED ORDER — CHLORHEXIDINE GLUCONATE 0.12 % MT SOLN
OROMUCOSAL | Status: AC
  Filled 2022-12-17: qty 15

## 2022-12-17 MED ORDER — ALBUTEROL SULFATE (2.5 MG/3ML) 0.083% IN NEBU
2.5000 mg | INHALATION_SOLUTION | Freq: Once | RESPIRATORY_TRACT | Status: AC
  Administered 2022-12-17: 2.5 mg via RESPIRATORY_TRACT

## 2022-12-17 MED ORDER — CARVEDILOL 25 MG PO TABS
25.0000 mg | ORAL_TABLET | Freq: Two times a day (BID) | ORAL | Status: DC
  Administered 2022-12-18: 25 mg via ORAL
  Filled 2022-12-17: qty 1

## 2022-12-17 MED ORDER — MIRABEGRON ER 25 MG PO TB24
25.0000 mg | ORAL_TABLET | Freq: Every day | ORAL | Status: DC
  Administered 2022-12-18: 25 mg via ORAL
  Filled 2022-12-17: qty 1

## 2022-12-17 MED ORDER — PROPOFOL 10 MG/ML IV BOLUS
INTRAVENOUS | Status: AC
  Filled 2022-12-17: qty 20

## 2022-12-17 MED ORDER — TAMSULOSIN HCL 0.4 MG PO CAPS
0.4000 mg | ORAL_CAPSULE | Freq: Every day | ORAL | Status: DC
  Administered 2022-12-18: 0.4 mg via ORAL
  Filled 2022-12-17: qty 1

## 2022-12-17 MED ORDER — FAMOTIDINE 20 MG PO TABS
ORAL_TABLET | ORAL | Status: AC
  Filled 2022-12-17: qty 1

## 2022-12-17 MED ORDER — FLUTICASONE FUROATE-VILANTEROL 200-25 MCG/ACT IN AEPB
1.0000 | INHALATION_SPRAY | Freq: Every day | RESPIRATORY_TRACT | Status: DC
  Administered 2022-12-18: 1 via RESPIRATORY_TRACT
  Filled 2022-12-17: qty 28

## 2022-12-17 MED ORDER — IPRATROPIUM-ALBUTEROL 0.5-2.5 (3) MG/3ML IN SOLN
3.0000 mL | RESPIRATORY_TRACT | Status: DC
  Administered 2022-12-17: 3 mL via RESPIRATORY_TRACT

## 2022-12-17 MED ORDER — IPRATROPIUM-ALBUTEROL 0.5-2.5 (3) MG/3ML IN SOLN: 3.0000 mL | Freq: Once | RESPIRATORY_TRACT | Status: DC

## 2022-12-17 MED ORDER — LIDOCAINE HCL (CARDIAC) PF 100 MG/5ML IV SOSY
PREFILLED_SYRINGE | INTRAVENOUS | Status: DC | PRN
  Administered 2022-12-17: 100 mg via INTRAVENOUS

## 2022-12-17 MED ORDER — ONDANSETRON HCL 4 MG/2ML IJ SOLN
INTRAMUSCULAR | Status: DC | PRN
  Administered 2022-12-17: 4 mg via INTRAVENOUS

## 2022-12-17 MED ORDER — SUCCINYLCHOLINE CHLORIDE 200 MG/10ML IV SOSY
PREFILLED_SYRINGE | INTRAVENOUS | Status: DC | PRN
  Administered 2022-12-17: 100 mg via INTRAVENOUS

## 2022-12-17 MED ORDER — PHENYLEPHRINE 80 MCG/ML (10ML) SYRINGE FOR IV PUSH (FOR BLOOD PRESSURE SUPPORT)
PREFILLED_SYRINGE | INTRAVENOUS | Status: DC | PRN
  Administered 2022-12-17: 80 ug via INTRAVENOUS
  Administered 2022-12-17 (×2): 160 ug via INTRAVENOUS

## 2022-12-17 MED ORDER — VITAMIN D 25 MCG (1000 UNIT) PO TABS
1000.0000 [IU] | ORAL_TABLET | Freq: Every day | ORAL | Status: DC
  Administered 2022-12-18: 1000 [IU] via ORAL
  Filled 2022-12-17: qty 1

## 2022-12-17 MED ORDER — ONDANSETRON HCL 4 MG/2ML IJ SOLN: 4.0000 mg | Freq: Once | INTRAMUSCULAR | Status: DC | PRN

## 2022-12-17 MED ORDER — ASPIRIN 81 MG PO TBEC
81.0000 mg | DELAYED_RELEASE_TABLET | Freq: Every day | ORAL | Status: DC
  Administered 2022-12-18: 81 mg via ORAL
  Filled 2022-12-17: qty 1

## 2022-12-17 MED ORDER — ACETAMINOPHEN 325 MG PO TABS: 650.0000 mg | ORAL_TABLET | Freq: Once | ORAL | Status: DC | PRN

## 2022-12-17 MED ORDER — IPRATROPIUM BROMIDE HFA 17 MCG/ACT IN AERS
INHALATION_SPRAY | RESPIRATORY_TRACT | Status: DC | PRN
  Administered 2022-12-17: 1 via RESPIRATORY_TRACT

## 2022-12-17 MED ORDER — POTASSIUM CHLORIDE CRYS ER 10 MEQ PO TBCR
10.0000 meq | EXTENDED_RELEASE_TABLET | Freq: Every day | ORAL | Status: DC
  Administered 2022-12-18: 10 meq via ORAL
  Filled 2022-12-17 (×2): qty 1

## 2022-12-17 MED ORDER — PROPOFOL 10 MG/ML IV BOLUS
INTRAVENOUS | Status: DC | PRN
  Administered 2022-12-17: 100 mg via INTRAVENOUS
  Administered 2022-12-17: 50 mg via INTRAVENOUS

## 2022-12-17 MED ORDER — PHENYLEPHRINE HCL-NACL 20-0.9 MG/250ML-% IV SOLN
INTRAVENOUS | Status: DC | PRN
  Administered 2022-12-17: 40 ug/min via INTRAVENOUS

## 2022-12-17 MED ORDER — ACETAMINOPHEN 325 MG PO TABS: 650.0000 mg | ORAL_TABLET | ORAL | Status: DC | PRN

## 2022-12-17 MED ORDER — VASOPRESSIN 20 UNIT/ML IV SOLN
INTRAVENOUS | Status: DC | PRN
  Administered 2022-12-17: 2 [IU] via INTRAVENOUS
  Administered 2022-12-17 (×2): 4 [IU] via INTRAVENOUS

## 2022-12-17 MED ORDER — EPHEDRINE SULFATE (PRESSORS) 50 MG/ML IJ SOLN
INTRAMUSCULAR | Status: DC | PRN
  Administered 2022-12-17: 5 mg via INTRAVENOUS

## 2022-12-17 NOTE — Transfer of Care (Signed)
Immediate Anesthesia Transfer of Care Note  Patient: Mark Mccarty  Procedure(s) Performed: VIDEO BRONCHOSCOPY WITH ENDOBRONCHIAL ULTRASOUND ROBOTIC ASSISTED NAVIGATIONAL BRONCHOSCOPY  Patient Location: PACU  Anesthesia Type:General  Level of Consciousness: awake, drowsy, and patient cooperative  Airway & Oxygen Therapy: Patient Spontanous Breathing and Patient connected to face mask oxygen  Post-op Assessment: Report given to RN and Post -op Vital signs reviewed and stable  Post vital signs: Reviewed and stable  Last Vitals:  Vitals Value Taken Time  BP    Temp    Pulse 78 12/17/22 1359  Resp 22 12/17/22 1359  SpO2 90 % 12/17/22 1359  Vitals shown include unvalidated device data.  Last Pain:  Vitals:   12/17/22 1108  TempSrc: Temporal  PainSc: 0-No pain         Complications: No notable events documented.

## 2022-12-17 NOTE — Progress Notes (Signed)
Patient arrived to ultrasound for thoracentesis on a non-rebreather mask. Thoracentesis completed without incident and post-procedure CXR negative for pneumothorax. Patient taken off non-rebreather to discharge home and patient's O2 sats dropped into the low 70's and remained 72-76 for approximately 5-10 min. Patient asymptomatic. Patient not currently on oxygen at home but oxygen has been ordered (just not delivered yet via home health).  Discussed situation with Dr. Karna Christmas who recommended further monitoring and nebulizer treatment. Patient received Duoneb pre-bronchoscopy due to low oxygen levels. Patient to be re-admitted back to PACU, currently awaiting a bed in PACU. I have ordered a repeat Duoneb treatment.   Updated patient's two daughters and son who verbalized understanding.    Further plans per Dr. Aleskerov/PACU/Anesthesia   Alwyn Ren, AGACNP-BC [phone] 12/17/2022, 4:57 PM

## 2022-12-17 NOTE — Anesthesia Procedure Notes (Signed)
Procedure Name: Intubation Date/Time: 12/17/2022 12:52 PM  Performed by: Mohammed Kindle, CRNAPre-anesthesia Checklist: Patient identified, Emergency Drugs available, Suction available and Patient being monitored Patient Re-evaluated:Patient Re-evaluated prior to induction Oxygen Delivery Method: Circle system utilized Preoxygenation: Pre-oxygenation with 100% oxygen Induction Type: IV induction Ventilation: Mask ventilation without difficulty Laryngoscope Size: McGraph and 3 Grade View: Grade I Tube type: Oral Tube size: 9.0 mm Number of attempts: 1 Airway Equipment and Method: Stylet and Oral airway Placement Confirmation: ETT inserted through vocal cords under direct vision, positive ETCO2, breath sounds checked- equal and bilateral and CO2 detector Secured at: 21 cm Tube secured with: Tape Dental Injury: Teeth and Oropharynx as per pre-operative assessment

## 2022-12-17 NOTE — H&P (Signed)
PULMONOLOGY         Date: 12/17/2022,   MRN# [id number] [name] 12-30-1950     AdmissionWeight: 97.5 kg                 CurrentWeight: 97.5 kg  Referring provider: Dr Cathie Hoops   CHIEF COMPLAINT:   Right lung cancer hx of left lung cancer   HISTORY OF PRESENT ILLNESS   [name] is 72 y.o. male who was referred to [facility] Pulmonary clinic due to dyspnea on exertion with shortness of breath at rest.  CT chest abdomen pelvis performed Nov 16, 2022 with mediastinum showing interval increase in circumferential soft tissue thickening about the right hilum. Increased in size of and enlargement AP window lymph nodes measuring 2.3 x 1.1 cm previously 1.7 x 0.9 cm. Lungs show moderate new right pleural effusion with diffuse nodular, enhancing pleural thickening. There is also proximal obstruction of the right upper lobe segment bronchi with complete atelectasis or consolidation of the right upper lobe. Findings are new compared to previous imaging. There is also severe underlying centrilobular emphysema. Patient was seen by oncology and thoracentesis was ordered. Body fluid cell count and differential shows a pleural fluid profile consistent with monocyte predominant exudate. Cytology shows positive for malignancy with adenocarcinoma. Gram stain and culture of pleural fluid is negative. He wishes to have full scope of therapy. Oncologist reports not enough tissue for treatment plan and we have scheduled bronchoscopy for this    PAST MEDICAL HISTORY   Past Medical History:  Diagnosis Date   Abnormal nuclear stress test    Anxiety    a.) on BZO (lorazepam) PRN   Asthma    Avascular necrosis of femoral head (HCC)    BPH (benign prostatic hyperplasia)    Cataract    a.) s/p extraction on LEFT   Colon adenomas    COPD (chronic obstructive pulmonary disease) (HCC)    Coronary artery disease 11/11/2014   a.) MPI 11/11/2014: EF 56%, mild inf wall ischemia; b.) LHC 12/16/2014:  20% mRCA - med mgmt   DDD (degenerative disc disease), lumbar    Diverticulosis    DM type 2 (diabetes mellitus, type 2) (HCC)    Duodenitis    Dysphagia    Dyspnea    ED (erectile dysfunction)    a.) on PDE5i (sildenafil) PRN   Elevated PSA    Former smoker    Gastritis    GERD (gastroesophageal reflux disease)    [name]    Hilar mass    History of hiatal hernia    Hyperlipidemia    Hypertension    Hyperuricemia    Hypokalemia    Left ventricular diastolic dysfunction 10/04/2016   a.) TTE 10/04/2016: EF 50%, mild MR/TR/PR, mod AR, G1DD; b.) TTE 10/10/2017: EF 45%, triv PR, mild AR/MR/TR, G1DD; c.) TTE 03/30/2020: EF 50%, mild-mod AR, mild MR/TR/PR; d.) TTE 04/23/2022: EF >55%, mild LVH, mild LAE, triv TR, mild PR, mod AR/MR   Liver lesion    Obesity    Pedal edema    Peripheral polyneuropathy    Peyronie's disease 05/2022   Pleural effusion    Primary lung adenocarcinoma, right (HCC)    a.) stage IIIA (cT3, cN1, cM0) --> s/p concurrent chemoradiation 2019 --> carboplatin + pacilitaxel (12/31/2017 - 02/19/2018) followed by 1 year (03/24/2018 - 03/17/2019) adjuvant anti-PDL-1 mAB immunotherapy (durvalumab)   [name] (peptic ulcer disease)    Renal insufficiency    Umbilical hernia  [name]      SURGICAL HISTORY   Past Surgical History:  Procedure Laterality Date   CARDIAC CATHETERIZATION N/A 12/16/2014   Procedure: Left Heart Cath;  Surgeon: Marcina Millard, MD;  Location: [facility] INVASIVE CV LAB;  Service: Cardiovascular;  Laterality: N/A;   CATARACT EXTRACTION Left    COLONOSCOPY     COLONOSCOPY WITH PROPOFOL N/A 01/14/2020   Procedure: COLONOSCOPY WITH PROPOFOL;  Surgeon: Toledo, Boykin Nearing, MD;  Location: [facility] ENDOSCOPY;  Service: Gastroenterology;  Laterality: N/A;   CYST REMOVAL TRUNK     chest and back over time and it was removed   CYSTECTOMY     ENDOBRONCHIAL ULTRASOUND N/A 12/09/2017   Procedure: ENDOBRONCHIAL ULTRASOUND;   Surgeon: Shane Crutch, MD;  Location: [name];  Service: Pulmonary;  Laterality: N/A;   PORTA CATH INSERTION N/A 12/23/2017   Procedure: PORTA CATH INSERTION;  Surgeon: Annice Needy, MD;  Location: [facility] INVASIVE CV LAB;  Service: Cardiovascular;  Laterality: N/A;   PORTA CATH REMOVAL N/A 06/04/2022   Procedure: PORTA CATH REMOVAL;  Surgeon: Annice Needy, MD;  Location: [facility] INV[facility] CV LAB;  Service: Cardiovascular;  Laterality: N/A;   UPPER GI ENDOSCOPY       FAMILY HISTORY   Family History  Problem Relation Age of Onset   Hypertension Mother    Breast cancer Mother    Heart attack Mother    Lung cancer Father    Heart disease Sister    Diabetes Sister    Colon cancer Sister      SOCIAL HISTORY   Social History   Tobacco Use   Smoking status: Former    Packs/day: 1.50    Years: 35.00    Additional pack years: 0.00    Total pack years: 52.50    Types: Cigarettes    Quit date: 06/22/2015    Years since quitting: 7.4    Passive exposure: Past   Smokeless tobacco: Never  Vaping Use   Vaping Use: Never used  Substance Use Topics   Alcohol use: Not Currently    Comment: weekends   Drug use: No     MEDICATIONS    Home Medication:    Current Medication:  Current Facility-Administered Medications:    0.9 %  sodium chloride infusion, , Intravenous, Continuous, Foye Deer, MD, Last Rate: 50 mL/hr at 12/17/22 1142, New Bag at 12/17/22 1142  Facility-Administered Medications Ordered in Other Encounters:    sodium chloride flush (NS) 0.9 % injection 10 mL, 10 mL, Intravenous, PRN, [name], MD, 10 mL at 03/18/18 0858    ALLERGIES   Penicillins     REVIEW OF SYSTEMS    Review of Systems:  Gen:  Denies  fever, sweats, chills weigh loss  HEENT: Denies blurred vision, double vision, ear pain, eye pain, hearing loss, nose bleeds, sore throat Cardiac:  No dizziness, chest pain or heaviness, chest tightness,edema Resp:   reports dyspnea  chronically  Gi: Denies swallowing difficulty, stomach pain, nausea or vomiting, diarrhea, constipation, bowel incontinence Gu:  Denies bladder incontinence, burning urine Ext:   Denies Joint pain, stiffness or swelling Skin: Denies  skin rash, easy bruising or bleeding or hives Endoc:  Denies polyuria, polydipsia , polyphagia or weight change Psych:   Denies depression, insomnia or hallucinations   Other:  All other systems negative   VS: BP 127/74   Pulse 80   Temp 98.7 F (37.1 C) (Temporal)   Resp 18   Ht 6' (1.829 m)  Wt 97.5 kg   BMI 29.16 kg/m      PHYSICAL EXAM    GENERAL:NAD, no fevers, chills, no weakness no fatigue HEAD: Normocephalic, atraumatic.  EYES: Pupils equal, round, reactive to light. Extraocular muscles intact. No scleral icterus.  MOUTH: Moist mucosal membrane. Dentition intact. No abscess noted.  EAR, NOSE, THROAT: Clear without exudates. No external lesions.  NECK: Supple. No thyromegaly. No nodules. No JVD.  PULMONARY: decreased breath sounds with mild rhonchi worse at bases bilaterally.  CARDIOVASCULAR: S1 and S2. Regular rate and rhythm. No murmurs, rubs, or gallops. No edema. Pedal pulses 2+ bilaterally.  GASTROINTESTINAL: Soft, nontender, nondistended. No masses. Positive bowel sounds. No hepatosplenomegaly.  MUSCULOSKELETAL: No swelling, clubbing, or edema. Range of motion full in all extremities.  NEUROLOGIC: Cranial nerves II through XII are intact. No gross focal neurological deficits. Sensation intact. Reflexes intact.  SKIN: No ulceration, lesions, rashes, or cyanosis. Skin warm and dry. Turgor intact.  PSYCHIATRIC: Mood, affect within normal limits. The patient is awake, alert and oriented x 3. Insight, judgment intact.       IMAGING   Narrative & Impression  CLINICAL DATA:  Lung cancer restaging, ongoing immunotherapy * Tracking Code: BO *   EXAM: CT CHEST, ABDOMEN, AND PELVIS WITH CONTRAST   TECHNIQUE: Multidetector CT  imaging of the chest, abdomen and pelvis was performed following the standard protocol during bolus administration of intravenous contrast.   RADIATION DOSE REDUCTION: This exam was performed according to the departmental dose-optimization program which includes automated exposure control, adjustment of the mA and/or kV according to patient size and/or use of iterative reconstruction technique.   CONTRAST:  OMNIPAQUE IOHEXOL 300 MG/ML  SOLN   COMPARISON:  CT chest, 05/16/2022, PET-CT, 02/05/2022   FINDINGS: CT CHEST FINDINGS   Cardiovascular: Aortic atherosclerosis. Normal heart size. Scattered left coronary artery calcifications. No pericardial effusion.   Mediastinum/Nodes: Interval increase in circumferential soft tissue thickening about the right hilum (series 2, image 24). Slight interval increase in size in enlarged AP window lymph nodes, measuring up to 2.3 x 1.1 cm, previously 1.7 x 0.9 cm (series 2, image 42). Thyroid gland, trachea, and esophagus demonstrate no significant findings.   Lungs/Pleura: New moderate right pleural effusion with diffuse, nodular, enhancing pleural thickening (series 2, image 29). Proximal obstruction of the right upper lobe segmental bronchi with complete atelectasis or consolidation of the right upper lobe, new compared to prior examination (series 2, image 19). Interval enlargement of a subpleural mass of the dependent superior segment left lower lobe, measuring 2.4 x 2.1 cm, previously 1.8 x 1.2 cm (series 3, image 68). Severe underlying emphysema.   Musculoskeletal: No chest wall abnormality. No acute osseous findings.   CT ABDOMEN PELVIS FINDINGS   Hepatobiliary: No solid liver abnormality is seen. No gallstones, gallbladder wall thickening, or biliary dilatation.   Pancreas: Unremarkable. No pancreatic ductal dilatation or surrounding inflammatory changes.   Spleen: Normal in size without significant abnormality.    Adrenals/Urinary Tract: Adrenal glands are unremarkable. Kidneys are normal, without renal calculi, solid lesion, or hydronephrosis. Bladder is unremarkable.   Stomach/Bowel: Stomach is within normal limits. Appendix appears normal. No evidence of bowel wall thickening, distention, or inflammatory changes. Pancolonic diverticulosis. Moderate burden of stool throughout the colon.   Vascular/Lymphatic: Aortic atherosclerosis. No enlarged abdominal or pelvic lymph nodes.   Reproductive: Prostatomegaly.   Other: No abdominal wall hernia or abnormality. No ascites.   Musculoskeletal: No acute osseous findings. Unchanged sclerotic, trabeculated vertebral body hemangiomata of T10  and L2 (series 6, image 100). No suspicious osseous lesions. 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   IMPRESSION: 1. New moderate right pleural effusion with diffuse, nodular, enhancing pleural thickening consistent with pleural metastatic disease. 2. Proximal obstruction of the right upper lobe segmental bronchi with complete atelectasis or consolidation of the right upper lobe, consistent with local malignant recurrence and postobstructive atelectasis of the upper lobe. 3. Interval increase in circumferential soft tissue thickening about the right hilum. Slight interval increase in size in enlarged AP window lymph nodes. 4. Interval enlargement of a subpleural mass of the dependent superior segment left lower lobe consistent with an enlarging pulmonary metastasis or metachronous primary lung malignancy. 5. Emphysema. 6. Coronary artery disease. 7. Prostatomegaly.   These results will be called to the ordering clinician or representative by the Radiologist Assistant, and communication documented in the PACS or [employer].   Aortic Atherosclerosis (ICD10-I70.0) and Emphysema (ICD10-J43.9).     Electronically Signed   By: Jearld Lesch M.D.   On: 11/16/2022  17:13    ASSESSMENT/PLAN   Right lung mass with hilar adenopathy   - plan for bronchoscopy with airway inspection navigational bronchosocpy and EBUS    -patient has hx of left lung cancer, now has right lung mass and hilar adenopathy   - patient has right pleural effusion and will have thoracentesis post procedure   --Reviewed risks/complications and benefits with patient, risks include infection, pneumothorax/pneumomediastinum which may require chest tube placement as well as overnight/prolonged hospitalization and possible mechanical ventilation. Other risks include bleeding and very rarely death.  Patient understands risks and wishes to proceed.  Additional questions were answered, and patient is aware that post procedure patient will be going home with family and may experience cough with possible clots on expectoration as well as phlegm which may last few days as well as hoarseness of voice post intubation and mechanical ventilation.        Thank you for allowing me to participate in the care of this patient.   Patient/Family are satisfied with care plan and all questions have been answered.    Provider disclosure: Patient with at least one acute or chronic illness or injury that poses a threat to life or bodily function and is being managed actively during this encounter.  All of the below services have been performed independently by signing provider:  review of prior documentation from internal and or external health records.  Review of previous and current lab results.  Interview and comprehensive assessment during patient visit today. Review of current and previous chest radiographs/CT scans. Discussion of management and test interpretation with health care team and patient/family.   This document was prepared using Dragon voice recognition software and may include unintentional dictation errors.     Vida Rigger, M.D.  Division of Pulmonary & Critical Care Medicine

## 2022-12-17 NOTE — Discharge Instructions (Signed)

## 2022-12-17 NOTE — Assessment & Plan Note (Addendum)
Please see HPI for full details. S/p repeat thoracentesis today.  Unfortunately the volume of effusion seems to have large and not completely resolved.  This is causing complication of hypoxia.  I do believe some hypoxia may also be due to atelectasis of the lung from endobronchial lesion.  We will follow along with pulmonology and see if patient would benefit from an indwelling right thoracentesis catheter for slow and complete drainage of his malignant pleural effusion.  Home oxygen has been ordered by pulmonology.  At this time no reason to suspect pneumonia or any other pathology that may be causing hypoxia.  Will monitor clinically closely.

## 2022-12-17 NOTE — Procedures (Signed)
PROCEDURE SUMMARY:  Successful US guided right thoracentesis. Yielded 1.5 L of amber-colored fluid. Pt tolerated procedure well. No immediate complications.  Specimen not sent for labs. CXR ordered; no post-procedure pneumothorax identified.   EBL < 2 mL  Mickie Kay, NP 12/17/2022 4:49 PM

## 2022-12-17 NOTE — H&P (Signed)
History and Physical    Patient: Mark Mccarty UJW:[id number] DOB: Apr 27, 1951 DOA: 12/17/2022 DOS: the patient was seen and examined on 12/17/2022 PCP: [name], NP  Patient coming from: Home  Chief Complaint: No chief complaint on file.  HPI: Mark Mccarty is a 72 y.o. male with medical history significant of remote lung CA on left s.p remote rx and belived to be in remission . Patient as undergoing surveliance till recently.    CT chest abdomen pelvis performed Nov 16, 2022 with mediastinum showing interval increase in circumferential soft tissue thickening about the right hilum. Increased in size of and enlargement AP window lymph nodes measuring 2.3 x 1.1 cm previously 1.7 x 0.9 cm. Lungs show moderate new right pleural effusion with diffuse nodular, enhancing pleural thickening. There is also proximal obstruction of the right upper lobe segment bronchi with complete atelectasis or consolidation of the right upper lobe. Findings are new compared to previous imaging. There is also severe underlying centrilobular emphysema. Patient was seen by oncology and thoracentesis was ordered. On 11/20/2022  Body fluid cell count and differential shows a pleural fluid profile consistent with monocyte predominant exudate. Cytology shows positive for malignancy with adenocarcinoma.  Patient was since then evaluated by oncology who felt that they did not have enough tissue sample to formulate a tissue treatment plan for the patient.  Therefore patient was advised to come to [facility] health today for a scheduled bronchoscopy with plan for biopsy of suspected cancerous lesion and foundation 1 testing.  Per report, on arrival, patient was found to be hypoxic on room air.  Nonetheless patient underwent a successful bronchoscopy, full report is pending, with finding of endobronchial mass that is highly concerning for malignancy which was biopsied.  Subsequently patient was treated by thoracentesis.   Unfortunately patient continues to require 2 L/min of supplementary oxygen, which patient does not take at home.  Medical evaluation is sought for observation overnight till home oxygen is delivered.  Patient reports insidiously progressive exertional shortness of breath over.  For at least 7 days.  Without any chest pain or fever there was associated dry cough there was no leg swelling presyncope or fall or palpitation.  Patient reports that his breathing has improved markedly since the thoracentesis was done.  Patient is now eating and drinking in the PACU area.  Patient reports no discomfort is on 2 L/min of supplementary oxygen.    Review of Systems: As mentioned in the history of present illness. All other systems reviewed and are negative. Past Medical History:  Diagnosis Date   Abnormal nuclear stress test    Anxiety    a.) on BZO (lorazepam) PRN   Asthma    Avascular necrosis of femoral head (HCC)    BPH (benign prostatic hyperplasia)    Cataract    a.) s/p extraction on LEFT   Colon adenomas    COPD (chronic obstructive pulmonary disease) (HCC)    Coronary artery disease 11/11/2014   a.) MPI 11/11/2014: EF 56%, mild inf wall ischemia; b.) LHC 12/16/2014: 20% mRCA - med mgmt   DDD (degenerative disc disease), lumbar    Diverticulosis    DM type 2 (diabetes mellitus, type 2) (HCC)    Duodenitis    Dysphagia    Dyspnea    ED (erectile dysfunction)    a.) on PDE5i (sildenafil) PRN   Elevated PSA    Former smoker    Gastritis    GERD (gastroesophageal reflux disease)  [name]    Hilar mass    History of hiatal hernia    Hyperlipidemia    Hypertension    Hyperuricemia    Hypokalemia    Left ventricular diastolic dysfunction 10/04/2016   a.) TTE 10/04/2016: EF 50%, mild MR/TR/PR, mod AR, G1DD; b.) TTE 10/10/2017: EF 45%, triv PR, mild AR/MR/TR, G1DD; c.) TTE 03/30/2020: EF 50%, mild-mod AR, mild MR/TR/PR; d.) TTE 04/23/2022: EF >55%, mild LVH, mild  LAE, triv TR, mild PR, mod AR/MR   Liver lesion    Obesity    Pedal edema    Peripheral polyneuropathy    Peyronie's disease 05/2022   Pleural effusion    Primary lung adenocarcinoma, right (HCC)    a.) stage IIIA (cT3, cN1, cM0) --> s/p concurrent chemoradiation 2019 --> carboplatin + pacilitaxel (12/31/2017 - 02/19/2018) followed by 1 year (03/24/2018 - 03/17/2019) adjuvant anti-PDL-1 mAB immunotherapy (durvalumab)   [name] (peptic ulcer disease)    Renal insufficiency    Umbilical hernia    [name]    Past Surgical History:  Procedure Laterality Date   CARDIAC CATHETERIZATION N/A 12/16/2014   Procedure: Left Heart Cath;  Surgeon: Marcina Millard, MD;  Location: [facility] INVASIVE CV LAB;  Service: Cardiovascular;  Laterality: N/A;   CATARACT EXTRACTION Left    COLONOSCOPY     COLONOSCOPY WITH PROPOFOL N/A 01/14/2020   Procedure: COLONOSCOPY WITH PROPOFOL;  Surgeon: Toledo, Boykin Nearing, MD;  Location: [facility] ENDOSCOPY;  Service: Gastroenterology;  Laterality: N/A;   CYST REMOVAL TRUNK     chest and back over time and it was removed   CYSTECTOMY     ENDOBRONCHIAL ULTRASOUND N/A 12/09/2017   Procedure: ENDOBRONCHIAL ULTRASOUND;  Surgeon: Shane Crutch, MD;  Location: [name];  Service: Pulmonary;  Laterality: N/A;   PORTA CATH INSERTION N/A 12/23/2017   Procedure: PORTA CATH INSERTION;  Surgeon: Annice Needy, MD;  Location: [facility] INVASIVE CV LAB;  Service: Cardiovascular;  Laterality: N/A;   PORTA CATH REMOVAL N/A 06/04/2022   Procedure: PORTA CATH REMOVAL;  Surgeon: Annice Needy, MD;  Location: [facility] INVASIVE CV LAB;  Service: Cardiovascular;  Laterality: N/A;   UPPER GI ENDOSCOPY     Social History:  reports that he quit smoking about 7 years ago. His smoking use included cigarettes. He has a 52.50 pack-year smoking history. He has been exposed to tobacco smoke. He has never used smokeless tobacco. He reports that he does not currently use alcohol. He reports that he  does not use drugs.  Allergies  Allergen Reactions   Penicillins Rash    Stomach hurt Has patient had a PCN reaction causing immediate rash, facial/tongue/throat swelling, SOB or lightheadedness with hypotension: yes Has patient had a PCN reaction causing severe rash involving mucus membranes or skin necrosis: no Has patient had a PCN reaction that required hospitalization: no Has patient had a PCN reaction occurring within the last 10 years: yes If all of the above answers are "NO", then may proceed with Cephalosporin use.     Family History  Problem Relation Age of Onset   Hypertension Mother    Breast cancer Mother    Heart attack Mother    Lung cancer Father    Heart disease Sister    Diabetes Sister    Colon cancer Sister     Prior to Admission medications   Medication Sig Start Date End Date Taking? Authorizing Provider  acetaminophen (TYLENOL) 500 MG tablet Take 500 mg by mouth as needed.  Yes [provider]  albuterol (VENTOLIN HFA) 108 (90 Base) MCG/ACT inhaler Inhale 2 puffs into the lungs every 4 (four) hours as needed. 11/08/22  Yes [provider]  amLODipine (NORVASC) 10 MG tablet Take 10 mg by mouth every morning. 09/11/17  Yes [provider]  carvedilol (COREG) 25 MG tablet Take 25 mg by mouth 2 (two) times daily with a meal.   Yes [provider]  Fluticasone-Umeclidin-Vilant (TRELEGY ELLIPTA) 200-62.5-25 MCG/ACT AEPB Inhale 1 puff into the lungs every morning.   Yes [provider]  ipratropium-albuterol (DUONEB) 0.5-2.5 (3) MG/3ML SOLN Take 3 mLs by nebulization every 6 (six) hours as needed.   Yes [provider]  lisinopril (PRINIVIL,ZESTRIL) 40 MG tablet Take 40 mg by mouth every morning.   Yes [provider]  LORazepam (ATIVAN) 0.5 MG tablet Take 1 tablet (0.5 mg total) by mouth every 12 (twelve) hours as needed for anxiety. 11/21/22  Yes [name], MD  potassium chloride (KLOR-CON) 10 MEQ tablet  Take 1 tablet (10 mEq total) by mouth daily. Patient taking differently: Take 10 mEq by mouth every morning. 05/18/22  Yes [name], MD  pravastatin (PRAVACHOL) 20 MG tablet Take 20 mg by mouth at bedtime. 09/15/18  Yes [provider]  torsemide (DEMADEX) 20 MG tablet Take 20 mg by mouth every morning.   Yes [provider]  Vibegron (GEMTESA) 75 MG TABS Take 1 tablet (75 mg total) by mouth daily. Patient taking differently: Take 1 tablet by mouth every morning. 11/13/22  Yes Sondra Come, MD  ascorbic acid (VITAMIN C) 1000 MG tablet Take 1,000 mg by mouth daily.    Mickey Farber, MD  aspirin 81 MG tablet Take 81 mg by mouth daily.    [provider]  cetirizine (ZYRTEC) 10 MG tablet Take 1 tablet by mouth as needed. 10/25/22 10/25/23  [provider]  cholecalciferol (VITAMIN D3) 25 MCG (1000 UNIT) tablet Take 1,000 Units by mouth daily.    [provider]  gabapentin (NEURONTIN) 300 MG capsule Take 300 mg by mouth at bedtime as needed.     [provider]  ibuprofen (ADVIL,MOTRIN) 600 MG tablet Take 600 mg by mouth every 6 (six) hours as needed.    [provider]  lidocaine-prilocaine (EMLA) cream Apply to port area once Patient not taking: Reported on 11/21/2022 08/19/18   [name], MD  senna (SENOKOT) 8.6 MG TABS tablet Take 2 tablets (17.2 mg total) by mouth daily. Patient taking differently: Take 2 tablets by mouth as needed. 04/22/18   [name], MD  sildenafil (VIAGRA) 50 MG tablet Take 1-2 tablets (50-100 mg total) by mouth daily as needed for erectile dysfunction. 05/09/21   Sondra Come, MD  tamsulosin (FLOMAX) 0.4 MG CAPS capsule Take 0.4 mg by mouth daily after breakfast. 03/14/17   [provider]    Physical Exam: Vitals:   12/17/22 1818 12/17/22 1819 12/17/22 1928 12/17/22 1957  BP:    133/68  Pulse: 74 77 85 85  Resp:    18  Temp:    97.9 F (36.6 C)  TempSrc:      SpO2: (!) 88% 90% 96% 92%  Weight:       Height:       General: Patient is sitting upright in chair, having his meals, on supplementary oxygen.  No distress Respiratory; bilateral intravesicular Cardiovascular exam S1-S2 normal Abdomen soft nontender Extremities warm without edema Patient is alert awake oriented x 3  no focal motor deficit, 2 sons at the bedside Data Reviewed:  Labs on Admission:  Results for orders placed or performed during the hospital encounter of 12/17/22 (from the past 24 hour(s))  Glucose, capillary     Status: Abnormal   Collection Time: 12/17/22 11:11 AM  Result Value Ref Range   Glucose-Capillary 132 (H) 70 - 99 mg/dL  Glucose, capillary     Status: Abnormal   Collection Time: 12/17/22  1:53 PM  Result Value Ref Range   Glucose-Capillary 150 (H) 70 - 99 mg/dL   Comment 1 Notify RN    Comment 2 Document in Chart   Comprehensive metabolic panel     Status: Abnormal   Collection Time: 12/17/22  2:56 PM  Result Value Ref Range   Sodium 138 135 - 145 mmol/L   Potassium 3.6 3.5 - 5.1 mmol/L   Chloride 101 98 - 111 mmol/L   CO2 29 22 - 32 mmol/L   Glucose, Bld 141 (H) 70 - 99 mg/dL   BUN 11 8 - 23 mg/dL   Creatinine, Ser 1.61 0.61 - 1.24 mg/dL   Calcium 8.1 (L) 8.9 - 10.3 mg/dL   Total Protein 6.4 (L) 6.5 - 8.1 g/dL   Albumin 3.3 (L) 3.5 - 5.0 g/dL   AST 16 15 - 41 U/L   ALT 13 0 - 44 U/L   Alkaline Phosphatase 58 38 - 126 U/L   Total Bilirubin 0.6 0.3 - 1.2 mg/dL   GFR, Estimated >09 >60 mL/min   Anion gap 8 5 - 15   Basic Metabolic Panel: Recent Labs  Lab 12/17/22 1456  NA 138  K 3.6  CL 101  CO2 29  GLUCOSE 141*  BUN 11  CREATININE 0.82  CALCIUM 8.1*   Liver Function Tests: Recent Labs  Lab 12/17/22 1456  AST 16  ALT 13  ALKPHOS 58  BILITOT 0.6  PROT 6.4*  ALBUMIN 3.3*   No results for input(s): "LIPASE", "AMYLASE" in the last 168 hours. No results for input(s): "AMMONIA" in the last 168 hours. CBC: No results for input(s): "WBC", "NEUTROABS", "HGB", "HCT",  "MCV", "PLT" in the last 168 hours. Cardiac Enzymes: No results for input(s): "CKTOTAL", "CKMB", "CKMBINDEX", "TROPONINIHS" in the last 168 hours.  BNP (last 3 results) No results for input(s): "PROBNP" in the last 8760 hours. CBG: Recent Labs  Lab 12/17/22 1111 12/17/22 1353  GLUCAP 132* 150*    Radiological Exams on Admission:  US THORACENTESIS ASP PLEURAL SPACE W/IMG GUIDE  Result Date: 12/17/2022 INDICATION: Patient with right lung cancer with recurrent pleural effusion. Interventional radiology asked to perform a therapeutic thoracentesis. EXAM: ULTRASOUND GUIDED THORACENTESIS MEDICATIONS: 1% lidocaine 10 mL COMPLICATIONS: None immediate. PROCEDURE: An ultrasound guided thoracentesis was thoroughly discussed with the patient and questions answered. The benefits, risks, alternatives and complications were also discussed. The patient understands and wishes to proceed with the procedure. Written consent was obtained. Ultrasound was performed to localize and mark an adequate pocket of fluid in the right chest. The area was then prepped and draped in the normal sterile fashion. 1% Lidocaine was used for local anesthesia. Under ultrasound guidance a 6 Fr Safe-T-Centesis catheter was introduced. Thoracentesis was performed. The catheter was removed and a dressing applied. FINDINGS: A total of approximately 1.5 L of amber colored fluid was removed. IMPRESSION: Successful ultrasound guided right thoracentesis yielding 1.5 L of pleural fluid. Procedure performed by Alwyn Ren NP and supervised by Dr. Archer Asa. Electronically Signed   By: Vilma Prader  Archer Asa M.D.   On: 12/17/2022 20:15   DG Chest Port 1 View  Result Date: 12/17/2022 CLINICAL DATA:  Status post bronchoscopy EXAM: PORTABLE CHEST 1 VIEW COMPARISON:  11/20/2022 FINDINGS: There is only a miniscule amount of aerated lung in the right mid upper chest, otherwise the right hemithorax is completely opacified. Indistinct pulmonary  vasculature on the left possibly from pulmonary venous hypertension. No blunting of the left costophrenic angle. No visible pneumothorax. IMPRESSION: 1. Near complete opacification of the right hemithorax, with only a miniscule amount of aerated lung in the right mid upper chest. 2. Indistinct pulmonary vasculature on the left possibly from pulmonary venous hypertension. Electronically Signed   By: Gaylyn Rong M.D.   On: 12/17/2022 17:56   DG Chest Port 1 View  Result Date: 12/17/2022 CLINICAL DATA:  Status post thoracentesis EXAM: PORTABLE CHEST 1 VIEW COMPARISON:  Chest x-ray dated June 17th 2020 FINDINGS: Visualized cardiac and mediastinal contours are unchanged. Moderate right pleural effusion, decreased in size when compared with the prior exam. Lung predominant heterogeneous opacities, likely atelectasis. Persistent right upper lobe collapse. No evidence of pneumothorax. IMPRESSION: 1. Moderate right pleural effusion, decreased in size. 2. No evidence of pneumothorax. 3. Persistent right upper lobe collapse. Electronically Signed   By: Allegra Lai M.D.   On: 12/17/2022 16:42      Assessment and Plan: * Pleural effusion Please see HPI for full details. S/p repeat thoracentesis today.  Unfortunately the volume of effusion seems to have large and not completely resolved.  This is causing complication of hypoxia.  I do believe some hypoxia may also be due to atelectasis of the lung from endobronchial lesion.  We will follow along with pulmonology and see if patient would benefit from an indwelling right thoracentesis catheter for slow and complete drainage of his malignant pleural effusion.  Home oxygen has been ordered by pulmonology.  At this time no reason to suspect pneumonia or any other pathology that may be causing hypoxia.  Will monitor clinically closely.      Advance Care Planning:   Code Status: Full Code   Consults: Dr. Chilton Si of pulm.  Family Communication: sons at  bedside during this encounter.  Severity of Illness: The appropriate patient status for this patient is OBSERVATION. Observation status is judged to be reasonable and necessary in order to provide the required intensity of service to ensure the patient's safety. The patient's presenting symptoms, physical exam findings, and initial radiographic and laboratory data in the context of their medical condition is felt to place them at decreased risk for further clinical deterioration. Furthermore, it is anticipated that the patient will be medically stable for discharge from the hospital within 2 midnights of admission.   Author: Nolberto Hanlon, MD 12/17/2022 8:23 PM  For on call review [email or web address].

## 2022-12-17 NOTE — Procedures (Signed)
NAVIGATIONAL BRONCHOSCOPY PROCEDURE NOTE  FIBEROPTIC BRONCHOSCOPY WITH BRONCHOALVEOLAR LAVAGE and THERAPEUTIC ASPIRATION OF TRACHEOBRONCHIAL TREE PROCEDURE NOTE   Flexible bronchoscopy was performed  by : Karna Christmas MD  assistance by : 1)Repiratory therapist  and 2)[employer] staff and 3) Anesthesia team and 4) Flouroscopy team and 5) [facility] supporting staff   Indication for the procedure was :  Pre-procedural H&P. The following assessment was performed on the day of the procedure prior to initiating sedation History:  Chest pain n Dyspnea y Hemoptysis n Cough y Fever n Other pertinent items n  Examination Vital signs -reviewed as per nursing documentation today Cardiac    Murmurs: n  Rubs : n  Gallop: n Lungs Wheezing: n Rales : n Rhonchi :y  Other pertinent findings: SOB/hypoxemia due to chronic lung disease   Pre-procedural assessment for Procedural Sedation included: Depth of sedation: As per anesthesia team  ASA Classification:  2 Mallampati airway assessment: 3    Medication list reviewed: y  The patient's interval history was taken and revealed: no new complaints The pre- procedure physical examination revealed: No new findings Refer to prior clinic note for details.  Informed Consent: Informed consent was obtained from:  patient after explanation of procedure and risks, benefits, as well as alternative procedures available.  Explanation of level of sedation and possible transfusion was also provided.    Procedural Preparation: Time out was performed and patient was identified by name and birthdate and procedure to be performed and side for sampling, if any, was specified. Pt was intubated by anesthesia.  The patient was appropriately draped.   Fiberoptic bronchoscopy with airway inspection and BAL Procedure findings:  Bronchoscope was inserted via ETT  without difficulty.  Posterior oropharynx, epiglottis, arytenoids, false cords and vocal cords  were not visualized as these were bypassed by endotracheal tube. The distal trachea was normal in circumference and appearance without mucosal, cartilaginous or branching abnormalities.  The main carina was mildly splayed . All right and left lobar airways were visualized to the Subsegmental level.  Sub- sub segmental carinae were identified in all the distal airways.   Secretions were visible in the following airways and appeared to be clear.  The mucosa was : friable at RUL  Airways were notable for:        exophytic lesions :n       extrinsic compression in the following distributions: n.       Friable mucosa: y       Teacher, music /pigmentation: n     EXTRINSICALLY COMPRESSED AIRWAYS BILATERALLY.  RIGHT LOWER LOBE EXTRINSICALLY COMPRESSED.  RIGHT UPPER LOBE APICAL SEGMENT AND POSTERIOR SEGMENT 100% OCCLUDED.  MUCUS PLUGGING WAS WORSE AT RLL THIS WAS EVACUATED WITH THERAPEUTIC ASPIRATION  Post procedure Diagnosis:   BAL DONE AT RUL      Robotic Navigational Bronchoscopy Procedure Findings:   Post appropriate planning and registration peripheral navigation was used to visualize target lesion.    RUL - FNA X1 = LESIONAL - TRANSBRONCHIAL BIOPSY RUL- SURGICAL BIOPSY X 7= LESIONAL - ENDOBRONCHIAL BIOPSY   Post procedure diagnosis: l ung cancer     Specimens obtained included:  Broncho-alveolar lavage site:RLL  sent for CYTOLOGY                             40ml volume infused 20ml volume returned with [facility] appearance    Immediate sampling complications included:NONE  Epinephrine NONE ml was used topically  The bronchoscopy  was terminated due to completion of the planned procedure and the bronchoscope was removed.   Total dosage of Lidocaine was NONE mg Total fluoroscopy time was NONE  minutes  Supplemental oxygen was provided at AS PER ANESTHES lpm by nasal canula post operatively  Estimated Blood loss: <5cc.  Complications included:  NONE  Preliminary  CXR findings :  IN PROCESS  Disposition: HOME WITH DAUGHTER  Follow up with Dr. Karna Christmas in 5 days for result discussion.     Vida Rigger MD  [facility] Division of Pulmonary & Critical Care Medicine

## 2022-12-17 NOTE — Anesthesia Preprocedure Evaluation (Addendum)
Anesthesia Evaluation  Patient identified by MRN, date of birth, ID band Patient awake    Reviewed: Allergy & Precautions, NPO status , Patient's Chart, lab work & pertinent test results  History of Anesthesia Complications Negative for: history of anesthetic complications  Airway Mallampati: III   Neck ROM: Full    Dental  (+) Missing, Chipped   Pulmonary shortness of breath, asthma , COPD, former smoker (quit 2016) Lung CA  Tachypnea          Cardiovascular hypertension, Normal cardiovascular exam Rhythm:Regular Rate:Normal  ECG 12/11/22:  Normal sinus rhythm Prolonged QT Abnormal ECG When compared with ECG of 06-Dec-2017 13:50, No significant change was found  Echo 04/23/22:  NORMAL LEFT VENTRICULAR SYSTOLIC FUNCTION   WITH MILD LVH  NORMAL RIGHT VENTRICULAR SYSTOLIC FUNCTION  MODERATE VALVULAR REGURGITATION  NO VALVULAR STENOSIS  ESTIMATED LVEF >55%  (CALCULATED: 60.3%)  GLS: -15.9%  Aortic: MODERATE AI  Mitral: MODERATE MR  Tricuspid: TRIVIAL TR  Pulmonic: MILD PI  MILD LAE    Neuro/Psych negative neurological ROS     GI/Hepatic hiatal hernia, [name],GERD  ,,  Endo/Other  diabetes, Type 2    Renal/GU negative Renal ROS   BPH    Musculoskeletal   Abdominal   Peds  Hematology negative hematology ROS (+)   Anesthesia Other Findings Cardiology note 10/16/22:  72 y.o. male with  1. S/P cardiac catheterization  2. Left ventricular diastolic dysfunction  3. Hypertension associated with type 2 diabetes mellitus (CMS/HHS-HCC)  4. Nonrheumatic aortic valve insufficiency  5. Hyperlipidemia associated with type 2 diabetes mellitus (CMS/HHS-HCC)  6. Type 2 diabetes mellitus with other specified complication, without long-term current use of insulin (CMS/HHS-HCC)   72 year old gentleman with essential hypertension, blood pressure well controlled on current BP medications. 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 The patient had a chest CT in 10/2019 which revealed stable right perihilar radiation fibrosis with no evidence of tumor recurrence or metastatic disease. 2D echocardiogram 04/23/2022 revealed LVEF grain 55% with moderate mitral and aortic regurgitation. 2D echocardiogram on 10/10/2017 revealed LVEF of 45-50% with mild to moderate aortic and mitral insufficiency which is largely unchanged from previous 2D echocardiogram on 09/30/2016.   Plan   1. Continue current medications 2. Continue low-sodium diet 3. DASH diet printed instructions given to the patient 4. Counseled patient about low-cholesterol diet 5. Continue pravastatin for hyperlipidemia management 6. Low-fat and cholesterol diet printed instructions given to patient 7. Return to clinic for follow-up in 6 months  No orders of the defined types were placed in this encounter.  Return in about 6 months (around 04/17/2023).    Reproductive/Obstetrics                             Anesthesia Physical Anesthesia Plan  ASA: 3  Anesthesia Plan: General   Post-op Pain Management:    Induction: Intravenous  PONV Risk Score and Plan: 2 and Ondansetron, Dexamethasone and Treatment may vary due to age or medical condition  Airway Management Planned: Oral ETT  Additional Equipment:   Intra-op Plan:   Post-operative Plan: Extubation in OR  Informed Consent: I have reviewed the patients History and Physical, chart, labs and discussed the procedure including the risks, benefits and alternatives for the proposed anesthesia with the patient or authorized representative who has indicated his/her understanding and acceptance.     Dental  advisory given  Plan Discussed with: CRNA  Anesthesia Plan Comments: (Patient consented for risks of anesthesia including but not limited to:  - adverse reactions to  medications - damage to eyes, teeth, lips or other oral mucosa - nerve damage due to positioning  - sore throat or hoarseness - damage to heart, brain, nerves, lungs, other parts of body or loss of life  Informed patient about role of CRNA in peri- and intra-operative care.  Patient voiced understanding.)        Anesthesia Quick Evaluation

## 2022-12-18 ENCOUNTER — Ambulatory Visit: Payer: Medicare Other

## 2022-12-18 ENCOUNTER — Encounter: Payer: Self-pay | Admitting: Pulmonary Disease

## 2022-12-18 ENCOUNTER — Observation Stay: Payer: Medicare Other

## 2022-12-18 DIAGNOSIS — C3411 Malignant neoplasm of upper lobe, right bronchus or lung: Secondary | ICD-10-CM | POA: Diagnosis not present

## 2022-12-18 DIAGNOSIS — J90 Pleural effusion, not elsewhere classified: Secondary | ICD-10-CM | POA: Diagnosis not present

## 2022-12-18 LAB — PROTIME-INR
INR: 1.2 (ref 0.8–1.2)
Prothrombin Time: 15.7 seconds — ABNORMAL HIGH (ref 11.4–15.2)

## 2022-12-18 LAB — APTT: aPTT: 31 seconds (ref 24–36)

## 2022-12-18 MED ORDER — ORAL CARE MOUTH RINSE: 15.0000 mL | OROMUCOSAL | Status: DC | PRN

## 2022-12-18 NOTE — Progress Notes (Signed)
PULMONOLOGY         Date: 12/18/2022,   MRN# [id number] Mark Mccarty [birth year]     AdmissionWeight: 97.5 kg                 CurrentWeight: 97.5 kg  Referring provider: Dr Cathie Hoops   CHIEF COMPLAINT:   Right lung cancer hx of left lung cancer   HISTORY OF PRESENT ILLNESS   Mark Mccarty is 72 y.o. male who was referred to [facility] Pulmonary clinic due to dyspnea on exertion with shortness of breath at rest.  CT chest abdomen pelvis performed Nov 16, 2022 with mediastinum showing interval increase in circumferential soft tissue thickening about the right hilum. Increased in size of and enlargement AP window lymph nodes measuring 2.3 x 1.1 cm previously 1.7 x 0.9 cm. Lungs show moderate new right pleural effusion with diffuse nodular, enhancing pleural thickening. There is also proximal obstruction of the right upper lobe segment bronchi with complete atelectasis or consolidation of the right upper lobe. Findings are new compared to previous imaging. There is also severe underlying centrilobular emphysema. Patient was seen by oncology and thoracentesis was ordered. Body fluid cell count and differential shows a pleural fluid profile consistent with monocyte predominant exudate. Cytology shows positive for malignancy with adenocarcinoma. Gram stain and culture of pleural fluid is negative. He wishes to have full scope of therapy. Oncologist reports not enough tissue for treatment plan and we have scheduled bronchoscopy for this    12/18/22- patient observed overnight for ongoing hypoxemia with newly diagnosed right upper lobe lung cancer and recurrent large malignant effusion. He is doing well in no distress sitting up in chair.  His lung sounds are clear and diminshed on right side.  Daughter at bedside. Plan to send home with O2 and continue management of lung  cancer and pleural effusion.    PAST MEDICAL HISTORY   Past Medical History:  Diagnosis Date   Abnormal nuclear stress  test    Anxiety    a.) on BZO (lorazepam) PRN   Asthma    Avascular necrosis of femoral head (HCC)    BPH (benign prostatic hyperplasia)    Cataract    a.) s/p extraction on LEFT   Colon adenomas    COPD (chronic obstructive pulmonary disease) (HCC)    Coronary artery disease 11/11/2014   a.) MPI 11/11/2014: EF 56%, mild inf wall ischemia; b.) LHC 12/16/2014: 20% mRCA - med mgmt   DDD (degenerative disc disease), lumbar    Diverticulosis    DM type 2 (diabetes mellitus, type 2) (HCC)    Duodenitis    Dysphagia    Dyspnea    ED (erectile dysfunction)    a.) on PDE5i (sildenafil) PRN   Elevated PSA    Former smoker    Gastritis    GERD (gastroesophageal reflux disease)    [name]    Hilar mass    History of hiatal hernia    Hyperlipidemia    Hypertension    Hyperuricemia    Hypokalemia    Left ventricular diastolic dysfunction 10/04/2016   a.) TTE 10/04/2016: EF 50%, mild MR/TR/PR, mod AR, G1DD; b.) TTE 10/10/2017: EF 45%, triv PR, mild AR/MR/TR, G1DD; c.) TTE 03/30/2020: EF 50%, mild-mod AR, mild MR/TR/PR; d.) TTE 04/23/2022: EF >55%, mild LVH, mild LAE, triv TR, mild PR, mod AR/MR   Liver lesion    Obesity    Pedal edema    Peripheral polyneuropathy  Peyronie's disease 05/2022   Pleural effusion    Primary lung adenocarcinoma, right (HCC)    a.) stage IIIA (cT3, cN1, cM0) --> s/p concurrent chemoradiation 2019 --> carboplatin + pacilitaxel (12/31/2017 - 02/19/2018) followed by 1 year (03/24/2018 - 03/17/2019) adjuvant anti-PDL-1 mAB immunotherapy (durvalumab)   [name] (peptic ulcer disease)    Renal insufficiency    Umbilical hernia    [name]      SURGICAL HISTORY   Past Surgical History:  Procedure Laterality Date   CARDIAC CATHETERIZATION N/A 12/16/2014   Procedure: Left Heart Cath;  Surgeon: Marcina Millard, MD;  Location: [facility] INVASIVE CV LAB;  Service: Cardiovascular;  Laterality: N/A;   CATARACT EXTRACTION Left     COLONOSCOPY     COLONOSCOPY WITH PROPOFOL N/A 01/14/2020   Procedure: COLONOSCOPY WITH PROPOFOL;  Surgeon: Toledo, Boykin Nearing, MD;  Location: [facility] ENDOSCOPY;  Service: Gastroenterology;  Laterality: N/A;   CYST REMOVAL TRUNK     chest and back over time and it was removed   CYSTECTOMY     ENDOBRONCHIAL ULTRASOUND N/A 12/09/2017   Procedure: ENDOBRONCHIAL ULTRASOUND;  Surgeon: Shane Crutch, MD;  Location: [name];  Service: Pulmonary;  Laterality: N/A;   PORTA CATH INSERTION N/A 12/23/2017   Procedure: PORTA CATH INSERTION;  Surgeon: Annice Needy, MD;  Location: [facility] INVASIVE CV LAB;  Service: Cardiovascular;  Laterality: N/A;   PORTA CATH REMOVAL N/A 06/04/2022   Procedure: PORTA CATH REMOVAL;  Surgeon: Annice Needy, MD;  Location: [facility] INVASIVE CV LAB;  Service: Cardiovascular;  Laterality: N/A;   UPPER GI ENDOSCOPY       FAMILY HISTORY   Family History  Problem Relation Age of Onset   Hypertension Mother    Breast cancer Mother    Heart attack Mother    Lung cancer Father    Heart disease Sister    Diabetes Sister    Colon cancer Sister      SOCIAL HISTORY   Social History   Tobacco Use   Smoking status: Former    Packs/day: 1.50    Years: 35.00    Additional pack years: 0.00    Total pack years: 52.50    Types: Cigarettes    Quit date: 06/22/2015    Years since quitting: 7.4    Passive exposure: Past   Smokeless tobacco: Never  Vaping Use   Vaping Use: Never used  Substance Use Topics   Alcohol use: Not Currently    Comment: weekends   Drug use: No     MEDICATIONS    Home Medication:    Current Medication:  Current Facility-Administered Medications:    acetaminophen (TYLENOL) tablet 650 mg, 650 mg, Oral, Q4H PRN, Nolberto Hanlon, MD   albuterol (PROVENTIL) (2.5 MG/3ML) 0.083% nebulizer solution 3 mL, 3 mL, Nebulization, Q4H PRN, Nolberto Hanlon, MD   amLODipine (NORVASC) tablet 10 mg, 10 mg, Oral, Daily, Nolberto Hanlon, MD, 10 mg at 12/18/22 0942    aspirin EC tablet 81 mg, 81 mg, Oral, Daily, Nolberto Hanlon, MD, 81 mg at 12/18/22 0941   carvedilol (COREG) tablet 25 mg, 25 mg, Oral, BID WC, Nolberto Hanlon, MD, 25 mg at 12/18/22 0941   cholecalciferol (VITAMIN D3) 25 MCG (1000 UNIT) tablet 1,000 Units, 1,000 Units, Oral, Daily, Nolberto Hanlon, MD, 1,000 Units at 12/18/22 0941   enoxaparin (LOVENOX) injection 40 mg, 40 mg, Subcutaneous, Q24H, Nolberto Hanlon, MD, 40 mg at 12/18/22 0942   fluticasone furoate-vilanterol (BREO ELLIPTA) 200-25 MCG/ACT 1 puff, 1 puff, Inhalation,  Daily, 1 puff at 12/18/22 0944 **AND** umeclidinium bromide (INCRUSE ELLIPTA) 62.5 MCG/ACT 1 puff, 1 puff, Inhalation, Daily, Nolberto Hanlon, MD, 1 puff at 12/18/22 0943   gabapentin (NEURONTIN) capsule 300 mg, 300 mg, Oral, QHS PRN, Nolberto Hanlon, MD   ipratropium-albuterol (DUONEB) 0.5-2.5 (3) MG/3ML nebulizer solution 3 mL, 3 mL, Nebulization, Once, Nolberto Hanlon, MD   lisinopril (ZESTRIL) tablet 40 mg, 40 mg, Oral, Daily, Nolberto Hanlon, MD, 40 mg at 12/18/22 0941   LORazepam (ATIVAN) tablet 0.5 mg, 0.5 mg, Oral, Q12H PRN, Nolberto Hanlon, MD   mirabegron ER [facility]) tablet 25 mg, 25 mg, Oral, Daily, Nolberto Hanlon, MD, 25 mg at 12/18/22 0941   Oral care mouth rinse, 15 mL, Mouth Rinse, PRN, Nolberto Hanlon, MD   potassium chloride (KLOR-CON M) CR tablet 10 mEq, 10 mEq, Oral, Daily, Nolberto Hanlon, MD   pravastatin (PRAVACHOL) tablet 20 mg, 20 mg, Oral, QHS, Nolberto Hanlon, MD, 20 mg at 12/17/22 2242   senna (SENOKOT) tablet 17.2 mg, 2 tablet, Oral, PRN, Nolberto Hanlon, MD   tamsulosin [facility]) capsule 0.4 mg, 0.4 mg, Oral, QPC breakfast, Nolberto Hanlon, MD, 0.4 mg at 12/18/22 0941   torsemide (DEMADEX) tablet 20 mg, 20 mg, Oral, Daily, Nolberto Hanlon, MD, 20 mg at 12/18/22 1610  Facility-Administered Medications Ordered in Other Encounters:    sodium chloride flush (NS) 0.9 % injection 10 mL, 10 mL, Intravenous, PRN, [name], MD, 10 mL at 03/18/18 0858    ALLERGIES   Penicillins     REVIEW OF SYSTEMS     Review of Systems:  Gen:  Denies  fever, sweats, chills weigh loss  HEENT: Denies blurred vision, double vision, ear pain, eye pain, hearing loss, nose bleeds, sore throat Cardiac:  No dizziness, chest pain or heaviness, chest tightness,edema Resp:   reports dyspnea chronically  Gi: Denies swallowing difficulty, stomach pain, nausea or vomiting, diarrhea, constipation, bowel incontinence Gu:  Denies bladder incontinence, burning urine Ext:   Denies Joint pain, stiffness or swelling Skin: Denies  skin rash, easy bruising or bleeding or hives Endoc:  Denies polyuria, polydipsia , polyphagia or weight change Psych:   Denies depression, insomnia or hallucinations   Other:  All other systems negative   VS: BP 125/70 (BP Location: Left Arm)   Pulse 87   Temp 98 F (36.7 C)   Resp 18   Ht 6' (1.829 m)   Wt 97.5 kg   SpO2 94%   BMI 29.16 kg/m      PHYSICAL EXAM    GENERAL:NAD, no fevers, chills, no weakness no fatigue HEAD: Normocephalic, atraumatic.  EYES: Pupils equal, round, reactive to light. Extraocular muscles intact. No scleral icterus.  MOUTH: Moist mucosal membrane. Dentition intact. No abscess noted.  EAR, NOSE, THROAT: Clear without exudates. No external lesions.  NECK: Supple. No thyromegaly. No nodules. No JVD.  PULMONARY: decreased breath sounds with mild rhonchi worse at bases bilaterally.  CARDIOVASCULAR: S1 and S2. Regular rate and rhythm. No murmurs, rubs, or gallops. No edema. Pedal pulses 2+ bilaterally.  GASTROINTESTINAL: Soft, nontender, nondistended. No masses. Positive bowel sounds. No hepatosplenomegaly.  MUSCULOSKELETAL: No swelling, clubbing, or edema. Range of motion full in all extremities.  NEUROLOGIC: Cranial nerves II through XII are intact. No gross focal neurological deficits. Sensation intact. Reflexes intact.  SKIN: No ulceration, lesions, rashes, or cyanosis. Skin warm and dry. Turgor intact.  PSYCHIATRIC: Mood, affect within normal  limits. The patient is awake, alert and oriented x 3. Insight, judgment intact.  IMAGING   Narrative & Impression  CLINICAL DATA:  Lung cancer restaging, ongoing immunotherapy * Tracking Code: BO *   EXAM: CT CHEST, ABDOMEN, AND PELVIS WITH CONTRAST   TECHNIQUE: Multidetector CT imaging of the chest, abdomen and pelvis was performed following the standard protocol during bolus administration of intravenous contrast.   RADIATION DOSE REDUCTION: This exam was performed according to the departmental dose-optimization program which includes automated exposure control, adjustment of the mA and/or kV according to patient size and/or use of iterative reconstruction technique.   CONTRAST:  OMNIPAQUE IOHEXOL 300 MG/ML  SOLN   COMPARISON:  CT chest, 05/16/2022, PET-CT, 02/05/2022   FINDINGS: CT CHEST FINDINGS   Cardiovascular: Aortic atherosclerosis. Normal heart size. Scattered left coronary artery calcifications. No pericardial effusion.   Mediastinum/Nodes: Interval increase in circumferential soft tissue thickening about the right hilum (series 2, image 24). Slight interval increase in size in enlarged AP window lymph nodes, measuring up to 2.3 x 1.1 cm, previously 1.7 x 0.9 cm (series 2, image 42). Thyroid gland, trachea, and esophagus demonstrate no significant findings.   Lungs/Pleura: New moderate right pleural effusion with diffuse, nodular, enhancing pleural thickening (series 2, image 29). Proximal obstruction of the right upper lobe segmental bronchi with complete atelectasis or consolidation of the right upper lobe, new compared to prior examination (series 2, image 19). Interval enlargement of a subpleural mass of the dependent superior segment left lower lobe, measuring 2.4 x 2.1 cm, previously 1.8 x 1.2 cm (series 3, image 68). Severe underlying emphysema.   Musculoskeletal: No chest wall abnormality. No acute osseous findings.   CT ABDOMEN PELVIS  FINDINGS   Hepatobiliary: No solid liver abnormality is seen. No gallstones, gallbladder wall thickening, or biliary dilatation.   Pancreas: Unremarkable. No pancreatic ductal dilatation or surrounding inflammatory changes.   Spleen: Normal in size without significant abnormality.   Adrenals/Urinary Tract: Adrenal glands are unremarkable. Kidneys are normal, without renal calculi, solid lesion, or hydronephrosis. Bladder is unremarkable.   Stomach/Bowel: Stomach is within normal limits. Appendix appears normal. No evidence of bowel wall thickening, distention, or inflammatory changes. Pancolonic diverticulosis. Moderate burden of stool throughout the colon.   Vascular/Lymphatic: Aortic atherosclerosis. No enlarged abdominal or pelvic lymph nodes.   Reproductive: Prostatomegaly.   Other: No abdominal wall hernia or abnormality. No ascites.   Musculoskeletal: No acute osseous findings. Unchanged sclerotic, trabeculated vertebral body hemangiomata of T10 and L2 (series 6, image 100). No suspicious osseous lesions. 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   IMPRESSION: 1. New moderate right pleural effusion with diffuse, nodular, enhancing pleural thickening consistent with pleural metastatic disease. 2. Proximal obstruction of the right upper lobe segmental bronchi with complete atelectasis or consolidation of the right upper lobe, consistent with local malignant recurrence and postobstructive atelectasis of the upper lobe. 3. Interval increase in circumferential soft tissue thickening about the right hilum. Slight interval increase in size in enlarged AP window lymph nodes. 4. Interval enlargement of a subpleural mass of the dependent superior segment left lower lobe consistent with an enlarging pulmonary metastasis or metachronous primary lung malignancy. 5. Emphysema. 6. Coronary artery disease. 7. Prostatomegaly.   These results will be  called to the ordering clinician or representative by the Radiologist Assistant, and communication documented in the PACS or [employer].   Aortic Atherosclerosis (ICD10-I70.0) and Emphysema (ICD10-J43.9).     Electronically Signed   By: Jearld Lesch M.D.   On: 11/16/2022 17:13    ASSESSMENT/PLAN  Right lung mass with hilar adenopathy   - plan for bronchoscopy with airway inspection navigational bronchosocpy and EBUS    -patient has hx of left lung cancer, now has right lung mass and hilar adenopathy   - patient has right pleural effusion and will have thoracentesis post procedure   --Reviewed risks/complications and benefits with patient, risks include infection, pneumothorax/pneumomediastinum which may require chest tube placement as well as overnight/prolonged hospitalization and possible mechanical ventilation. Other risks include bleeding and very rarely death.  Patient understands risks and wishes to proceed.  Additional questions were answered, and patient is aware that post procedure patient will be going home with family and may experience cough with possible clots on expectoration as well as phlegm which may last few days as well as hoarseness of voice post intubation and mechanical ventilation.     Acute on chronic hypoxemic respiratory failure   - due to right lung cancer, emphysema, right pleural effusion, atelectasis   - continue O2 support    - IS at bedside use daily several times     - PT/OT    - Palliative care on outpatient   Thank you for allowing me to participate in the care of this patient.   Patient/Family are satisfied with care plan and all questions have been answered.    Provider disclosure: Patient with at least one acute or chronic illness or injury that poses a threat to life or bodily function and is being managed actively during this encounter.  All of the below services have been performed independently by signing provider:  review of prior  documentation from internal and or external health records.  Review of previous and current lab results.  Interview and comprehensive assessment during patient visit today. Review of current and previous chest radiographs/CT scans. Discussion of management and test interpretation with health care team and patient/family.   This document was prepared using Dragon voice recognition software and may include unintentional dictation errors.     Vida Rigger, M.D.  Division of Pulmonary & Critical Care Medicine

## 2022-12-18 NOTE — Hospital Course (Signed)
Mark Mccarty is a 72 y.o. male with medical history significant of remote lung CA on left s.p remote rx and belived to be in remission . Patient as undergoing surveliance till recently.  CT 11/16/22 concerning for enlarged LN, new R pleural effusion. Initial thoracentesis 11/20/22 was (+)adenocarcinoma. Bronchoscopy was scheduled and performed 12/17/22 at [facility] w/ Dr Karna Christmas, endobronchial mass biopsied, thoracentesis also performed 1.5 L removed. New O2 requirement, pt kept overnight for observation pending home O2 delivery. Repeat CXR 12/18/22 in AM   Consultants:  none  Procedures: None during this admission     ASSESSMENT & PLAN:   Principal Problem:   Pleural effusion  Pleural effusion s/p thoracentesis  Lung adenocarcinoma w/ endobronchial mass s/p bronchoscopy w/ biopsy  Acute hypoxic respiratory failure d/t above  Follow w/ oncology/pulmonology to review results of biopsy and discuss next steps  TOC to deliver home O2    DVT prophylaxis: *** Pertinent IV fluids/nutrition: *** Central lines / invasive devices: ***  Code Status: *** ACP documentation reviewed: ***  Current Admission Status: ***  TOC needs / Dispo plan: *** Barriers to discharge / significant pending items: ***

## 2022-12-18 NOTE — TOC Transition Note (Signed)
Transition of Care [facility]) - CM/SW Discharge Note   Patient Details  Name: Mark Mccarty MRN: [id number] Date of Birth: 28-May-1951  [facility] CM/SW Contact:  Garret Reddish, RN Phone Number: 12/18/2022, 3:39 PM   Clinical Narrative:   Chart reviewed.  Noted that patient will be a discharge home for today. Provider informs me that patient will need home 02 on discharge.    I have spoken with patient and he informs me that home 02 was delivered to his home on yesterday.  I have informed him that I will contact [employer] to deliver portable tank to the bedside prior to discharge today.  Patient has no other discharge needs.   I have confirmed with Barbara Cower with [employer] that home 02 set up was delivered to patient's home on yesterday.  I have asked Barbara Cower and Jonny Ruiz with [employer] to deliver portable 02 tank to bedside prior to patient discharging today.      I have informed staff nurse of the above information.      Final next level of care: Home/Self Care Barriers to Discharge: No Barriers Identified   Patient Goals and CMS Choice CMS Medicare.gov Compare Post Acute Care list provided to:: Patient Choice offered to / list presented to : Patient  Discharge Placement                      Patient and family notified of of transfer: 12/18/22  Discharge Plan and Services Additional resources added to the After Visit Summary for                  DME Arranged: Oxygen DME Agency: [employer] Date DME Agency Contacted: 12/18/22 Time DME Agency Contacted: 1400 Representative spoke with at DME Agency: Barbara Cower            Social Determinants of Health (SDOH) Interventions SDOH Screenings   Tobacco Use: Medium Risk (12/18/2022)     Readmission Risk Interventions     No data to display

## 2022-12-18 NOTE — Discharge Planning (Signed)
Patient ambulated in the hall on RA - SPO2 quickly fell to 85%. Given 1LNC and SPO2 maintained right at 90% Once back in room - sitting in chair (at rest) - after - SPO2 noted to be 78%. Increased O2 to [facility] and patient could maintain 90%  Patient will need O2 to be taken home, if [facility] home today.

## 2022-12-18 NOTE — Plan of Care (Signed)

## 2022-12-18 NOTE — Discharge Summary (Signed)
Physician Discharge Summary   Patient: Mark Mccarty MRN: [id number]  DOB: February 01, 1951   Admit:     Date of Admission: 12/17/2022 Admitted from: home   Discharge: Date of discharge: 12/18/22 Disposition: Home Condition at discharge: good  CODE STATUS: FULL CODE     Discharge Physician: Sunnie Nielsen, DO [facility]     PCP: [name], NP  Recommendations for Outpatient Follow-up:  Follow up with PCP [name], NP in 1-2 weeks Follow as directed w/ Dr Karna Christmas and oncology    Discharge Instructions     Diet - low sodium heart healthy   Complete by: As directed    Increase activity slowly   Complete by: As directed          Discharge Diagnoses: Principal Problem:   Pleural effusion       Hospital Course: Mark Mccarty is a 72 y.o. male with medical history significant of remote lung CA on left s.p remote rx and belived to be in remission . Patient as undergoing surveliance till recently.  CT 11/16/22 concerning for enlarged LN, new R pleural effusion. Initial thoracentesis 11/20/22 was (+)adenocarcinoma. Bronchoscopy was scheduled and performed 12/17/22 at [facility] w/ Dr Karna Christmas, endobronchial mass biopsied, thoracentesis also performed 1.5 L removed. New O2 requirement, pt kept overnight for observation pending home O2 delivery. Repeat CXR 12/18/22 in AM no concerns, pt   Consultants:  none  Procedures: None during this admission     ASSESSMENT & PLAN:   Pleural effusion s/p thoracentesis  Lung adenocarcinoma w/ endobronchial mass s/p bronchoscopy w/ biopsy  Acute hypoxic respiratory failure d/t above as well as lung cancer, emphysema Follow w/ oncology/pulmonology to review results of biopsy and discuss next steps  TOC to arrange deliver home O2 Palliative care outpatient          Discharge Instructions  Allergies as of 12/18/2022       Reactions   Penicillins Rash   Stomach hurt Has patient had a  PCN reaction causing immediate rash, facial/tongue/throat swelling, SOB or lightheadedness with hypotension: yes Has patient had a PCN reaction causing severe rash involving mucus membranes or skin necrosis: no Has patient had a PCN reaction that required hospitalization: no Has patient had a PCN reaction occurring within the last 10 years: yes If all of the above answers are "NO", then may proceed with Cephalosporin use.        Medication List     STOP taking these medications    lidocaine-prilocaine cream Commonly known as: EMLA       TAKE these medications    acetaminophen 500 MG tablet Commonly known as: TYLENOL Take 500 mg by mouth as needed.   albuterol 108 (90 Base) MCG/ACT inhaler Commonly known as: VENTOLIN HFA Inhale 2 puffs into the lungs every 4 (four) hours as needed.   amLODipine 10 MG tablet Commonly known as: NORVASC Take 10 mg by mouth every morning.   ascorbic acid 1000 MG tablet Commonly known as: VITAMIN C Take 1,000 mg by mouth daily.   aspirin 81 MG tablet Take 81 mg by mouth daily.   carvedilol 25 MG tablet Commonly known as: COREG Take 25 mg by mouth 2 (two) times daily with a meal.   cetirizine 10 MG tablet Commonly known as: ZYRTEC Take 1 tablet by mouth as needed.   cholecalciferol 25 MCG (1000 UNIT) tablet Commonly known as: VITAMIN D3 Take 1,000 Units by mouth daily.   gabapentin 300  MG capsule Commonly known as: NEURONTIN Take 300 mg by mouth at bedtime as needed.   Gemtesa 75 MG Tabs Generic drug: Vibegron Take 1 tablet (75 mg total) by mouth daily. What changed:  how much to take when to take this   ibuprofen 600 MG tablet Commonly known as: ADVIL Take 600 mg by mouth every 6 (six) hours as needed.   ipratropium-albuterol 0.5-2.5 (3) MG/3ML Soln Commonly known as: DUONEB Take 3 mLs by nebulization every 6 (six) hours as needed.   lisinopril 40 MG tablet Commonly known as: ZESTRIL Take 40 mg by mouth every  morning.   LORazepam 0.5 MG tablet Commonly known as: ATIVAN Take 1 tablet (0.5 mg total) by mouth every 12 (twelve) hours as needed for anxiety.   potassium chloride 10 MEQ tablet Commonly known as: KLOR-CON Take 1 tablet (10 mEq total) by mouth daily. What changed: when to take this   pravastatin 20 MG tablet Commonly known as: PRAVACHOL Take 20 mg by mouth at bedtime.   senna 8.6 MG Tabs tablet Commonly known as: SENOKOT Take 2 tablets (17.2 mg total) by mouth daily. What changed:  when to take this reasons to take this   sildenafil 50 MG tablet Commonly known as: VIAGRA Take 1-2 tablets (50-100 mg total) by mouth daily as needed for erectile dysfunction.   tamsulosin 0.4 MG Caps capsule Commonly known as: FLOMAX Take 0.4 mg by mouth daily after breakfast.   torsemide 20 MG tablet Commonly known as: DEMADEX Take 20 mg by mouth every morning.   Trelegy Ellipta 200-62.5-25 MCG/ACT Aepb Generic drug: Fluticasone-Umeclidin-Vilant Inhale 1 puff into the lungs every morning.               Durable Medical Equipment  (From admission, onward)           Start     Ordered   12/18/22 1347  For home use only DME oxygen  Once       Question Answer Comment  Length of Need 6 Months   Mode or (Route) Nasal cannula   Liters per Minute 2   Frequency Continuous (stationary and portable oxygen unit needed)   Oxygen conserving device Yes   Oxygen delivery system Gas      12/18/22 1347             Follow-up Information     Vida Rigger, MD Follow up.   Specialty: Pulmonary Disease Contact information: [address] [place] [address] [phone]                 Allergies  Allergen Reactions   Penicillins Rash    Stomach hurt Has patient had a PCN reaction causing immediate rash, facial/tongue/throat swelling, SOB or lightheadedness with hypotension: yes Has patient had a PCN reaction causing severe rash involving mucus membranes  or skin necrosis: no Has patient had a PCN reaction that required hospitalization: no Has patient had a PCN reaction occurring within the last 10 years: yes If all of the above answers are "NO", then may proceed with Cephalosporin use.      Subjective: pt states breathing is better today, no other concerns    Discharge Exam: BP 115/68 (BP Location: Left Arm)   Pulse 75   Temp 98.1 F (36.7 C)   Resp 16   Ht 6' (1.829 m)   Wt 97.5 kg   SpO2 94%   BMI 29.16 kg/m  General: Pt is alert, awake, not in acute distress Cardiovascular:  RRR, S1/S2 +, no rubs, no gallops Respiratory: CTA bilaterally, w/ diminished breath sounds R lung base  Abdominal: Soft, NT, ND, bowel sounds + Extremities: no edema, no cyanosis  Patient ambulated in the hall on RA - SPO2 quickly fell to 85%. Given 1LNC and SPO2 maintained right at 90% Once back in room - sitting in chair (at rest) - after - SPO2 noted to be 78%. Increased O2 to [facility] and patient could maintain 90%   Patient will need O2 to be taken home, if [facility] home today.   The results of significant diagnostics from this hospitalization (including imaging, microbiology, ancillary and laboratory) are listed below for reference.     Microbiology: No results found for this or any previous visit (from the past 240 hour(s)).   Labs: BNP (last 3 results) No results for input(s): "BNP" in the last 8760 hours. Basic Metabolic Panel: Recent Labs  Lab 12/17/22 1456 12/17/22 2136  NA 138  --   K 3.6  --   CL 101  --   CO2 29  --   GLUCOSE 141*  --   BUN 11  --   CREATININE 0.82 0.81  CALCIUM 8.1*  --    Liver Function Tests: Recent Labs  Lab 12/17/22 1456  AST 16  ALT 13  ALKPHOS 58  BILITOT 0.6  PROT 6.4*  ALBUMIN 3.3*   No results for input(s): "LIPASE", "AMYLASE" in the last 168 hours. No results for input(s): "AMMONIA" in the last 168 hours. CBC: Recent Labs  Lab 12/17/22 2136  WBC 8.3  HGB 14.9  HCT 45.0  MCV 85.6   PLT 359   Cardiac Enzymes: No results for input(s): "CKTOTAL", "CKMB", "CKMBINDEX", "TROPONINI" in the last 168 hours. BNP: Invalid input(s): "POCBNP" CBG: Recent Labs  Lab 12/17/22 1111 12/17/22 1353 12/17/22 2114  GLUCAP 132* 150* 174*   D-Dimer No results for input(s): "DDIMER" in the last 72 hours. Hgb A1c No results for input(s): "HGBA1C" in the last 72 hours. Lipid Profile No results for input(s): "CHOL", "HDL", "LDLCALC", "TRIG", "CHOLHDL", "LDLDIRECT" in the last 72 hours. Thyroid function studies No results for input(s): "TSH", "T4TOTAL", "T3FREE", "THYROIDAB" in the last 72 hours.  Invalid input(s): "FREET3" Anemia work up No results for input(s): "VITAMINB12", "FOLATE", "FERRITIN", "TIBC", "IRON", "RETICCTPCT" in the last 72 hours. Urinalysis    Component Value Date/Time   COLORURINE Yellow 07/10/2014 1328   APPEARANCEUR Clear 04/02/2018 1347   LABSPEC 1.012 07/10/2014 1328   PHURINE 5.0 07/10/2014 1328   GLUCOSEU Negative 04/02/2018 1347   GLUCOSEU Negative 07/10/2014 1328   HGBUR 1+ 07/10/2014 1328   BILIRUBINUR Negative 04/02/2018 1347   BILIRUBINUR Negative 07/10/2014 1328   KETONESUR Negative 07/10/2014 1328   PROTEINUR Negative 04/02/2018 1347   PROTEINUR Negative 07/10/2014 1328   NITRITE Negative 04/02/2018 1347   NITRITE Negative 07/10/2014 1328   LEUKOCYTESUR Negative 04/02/2018 1347   LEUKOCYTESUR Negative 07/10/2014 1328   Sepsis Labs Recent Labs  Lab 12/17/22 2136  WBC 8.3   Microbiology No results found for this or any previous visit (from the past 240 hour(s)). Imaging DG Chest Port 1 View  Result Date: 12/18/2022 CLINICAL DATA:  Pleural effusion. EXAM: PORTABLE CHEST 1 VIEW COMPARISON:  12/17/2022 FINDINGS: Stable asymmetric elevation right hemidiaphragm. Interval decrease in right pleural effusion. Bibasilar atelectasis again noted. Cardiopericardial silhouette is at upper limits of normal for size. No acute bony abnormality. Soft  tissue fullness in the medial right apex is similar to prior, compatible with  right upper lobe collapse. IMPRESSION: 1. Interval decrease in right pleural effusion. 2. Persistent right upper lobe collapse. Electronically Signed   By: Kennith Center M.D.   On: 12/18/2022 12:20   US THORACENTESIS ASP PLEURAL SPACE W/IMG GUIDE  Result Date: 12/17/2022 INDICATION: Patient with right lung cancer with recurrent pleural effusion. Interventional radiology asked to perform a therapeutic thoracentesis. EXAM: ULTRASOUND GUIDED THORACENTESIS MEDICATIONS: 1% lidocaine 10 mL COMPLICATIONS: None immediate. PROCEDURE: An ultrasound guided thoracentesis was thoroughly discussed with the patient and questions answered. The benefits, risks, alternatives and complications were also discussed. The patient understands and wishes to proceed with the procedure. Written consent was obtained. Ultrasound was performed to localize and mark an adequate pocket of fluid in the right chest. The area was then prepped and draped in the normal sterile fashion. 1% Lidocaine was used for local anesthesia. Under ultrasound guidance a 6 Fr Safe-T-Centesis catheter was introduced. Thoracentesis was performed. The catheter was removed and a dressing applied. FINDINGS: A total of approximately 1.5 L of amber colored fluid was removed. IMPRESSION: Successful ultrasound guided right thoracentesis yielding 1.5 L of pleural fluid. Procedure performed by Alwyn Ren NP and supervised by Dr. Archer Asa. Electronically Signed   By: Malachy Moan M.D.   On: 12/17/2022 20:15   DG Chest Port 1 View  Result Date: 12/17/2022 CLINICAL DATA:  Status post bronchoscopy EXAM: PORTABLE CHEST 1 VIEW COMPARISON:  11/20/2022 FINDINGS: There is only a miniscule amount of aerated lung in the right mid upper chest, otherwise the right hemithorax is completely opacified. Indistinct pulmonary vasculature on the left possibly from pulmonary venous hypertension. No  blunting of the left costophrenic angle. No visible pneumothorax. IMPRESSION: 1. Near complete opacification of the right hemithorax, with only a miniscule amount of aerated lung in the right mid upper chest. 2. Indistinct pulmonary vasculature on the left possibly from pulmonary venous hypertension. Electronically Signed   By: Gaylyn Rong M.D.   On: 12/17/2022 17:56   DG Chest Port 1 View  Result Date: 12/17/2022 CLINICAL DATA:  Status post thoracentesis EXAM: PORTABLE CHEST 1 VIEW COMPARISON:  Chest x-ray dated June 17th 2020 FINDINGS: Visualized cardiac and mediastinal contours are unchanged. Moderate right pleural effusion, decreased in size when compared with the prior exam. Lung predominant heterogeneous opacities, likely atelectasis. Persistent right upper lobe collapse. No evidence of pneumothorax. IMPRESSION: 1. Moderate right pleural effusion, decreased in size. 2. No evidence of pneumothorax. 3. Persistent right upper lobe collapse. Electronically Signed   By: Allegra Lai M.D.   On: 12/17/2022 16:42      Time coordinating discharge: over 30 minutes  SIGNED:  Sunnie Nielsen DO [facility]

## 2022-12-18 NOTE — Anesthesia Postprocedure Evaluation (Signed)
Anesthesia Post Note  Patient: Mark Mccarty  Procedure(s) Performed: VIDEO BRONCHOSCOPY WITH ENDOBRONCHIAL ULTRASOUND ROBOTIC ASSISTED NAVIGATIONAL BRONCHOSCOPY  Patient location during evaluation: PACU Anesthesia Type: General Level of consciousness: awake and alert Pain management: pain level controlled Vital Signs Assessment: post-procedure vital signs reviewed and stable Respiratory status: spontaneous breathing, nonlabored ventilation, respiratory function stable and patient connected to nasal cannula oxygen Cardiovascular status: blood pressure returned to baseline and stable Postop Assessment: no apparent nausea or vomiting Anesthetic complications: no   No notable events documented.   Last Vitals:  Vitals:   12/18/22 1320 12/18/22 1454  BP:  115/68  Pulse:  75  Resp:  16  Temp:  36.7 C  SpO2: 92% 94%    Last Pain:  Vitals:   12/18/22 1100  TempSrc:   PainSc: 0-No pain                 Stephanie Coup

## 2022-12-19 ENCOUNTER — Ambulatory Visit: Payer: Medicare Other

## 2022-12-19 ENCOUNTER — Ambulatory Visit
Admission: RE | Admit: 2022-12-19 | Discharge: 2022-12-19 | Disposition: A | Discharge status: Home / Self Care | Payer: Medicare Other | Source: Ambulatory Visit | Attending: Radiation Oncology | Admitting: Radiation Oncology

## 2022-12-20 ENCOUNTER — Other Ambulatory Visit: Payer: Self-pay | Admitting: Pathology

## 2022-12-20 ENCOUNTER — Inpatient Hospital Stay: Payer: Medicare Other | Attending: Oncology

## 2022-12-20 ENCOUNTER — Ambulatory Visit: Payer: Medicare Other

## 2022-12-20 DIAGNOSIS — Z7982 Long term (current) use of aspirin: Secondary | ICD-10-CM | POA: Insufficient documentation

## 2022-12-20 DIAGNOSIS — F419 Anxiety disorder, unspecified: Secondary | ICD-10-CM | POA: Insufficient documentation

## 2022-12-20 DIAGNOSIS — Z9221 Personal history of antineoplastic chemotherapy: Secondary | ICD-10-CM | POA: Insufficient documentation

## 2022-12-20 DIAGNOSIS — Z801 Family history of malignant neoplasm of trachea, bronchus and lung: Secondary | ICD-10-CM | POA: Insufficient documentation

## 2022-12-20 DIAGNOSIS — Z8249 Family history of ischemic heart disease and other diseases of the circulatory system: Secondary | ICD-10-CM | POA: Insufficient documentation

## 2022-12-20 DIAGNOSIS — Z800 Family history of malignant neoplasm of digestive organs: Secondary | ICD-10-CM | POA: Insufficient documentation

## 2022-12-20 DIAGNOSIS — Z79899 Other long term (current) drug therapy: Secondary | ICD-10-CM | POA: Insufficient documentation

## 2022-12-20 DIAGNOSIS — J90 Pleural effusion, not elsewhere classified: Secondary | ICD-10-CM | POA: Insufficient documentation

## 2022-12-20 DIAGNOSIS — Z87891 Personal history of nicotine dependence: Secondary | ICD-10-CM | POA: Insufficient documentation

## 2022-12-20 DIAGNOSIS — Z923 Personal history of irradiation: Secondary | ICD-10-CM | POA: Insufficient documentation

## 2022-12-20 DIAGNOSIS — C3411 Malignant neoplasm of upper lobe, right bronchus or lung: Secondary | ICD-10-CM | POA: Insufficient documentation

## 2022-12-20 DIAGNOSIS — Z510 Encounter for antineoplastic radiation therapy: Secondary | ICD-10-CM | POA: Diagnosis not present

## 2022-12-20 LAB — CYTOLOGY - NON PAP

## 2022-12-20 LAB — SURGICAL PATHOLOGY

## 2022-12-21 ENCOUNTER — Ambulatory Visit: Payer: Medicare Other

## 2022-12-21 ENCOUNTER — Telehealth: Payer: Self-pay | Admitting: *Deleted

## 2022-12-21 NOTE — Telephone Encounter (Signed)
[facility] NGS and (xT & xR with PD-L1 [id number] IHC requested) on ARS- [id number], lung biopsy, collected 12/17/22     Financial assistance application submitted.

## 2022-12-24 ENCOUNTER — Ambulatory Visit: Admission: RE | Admit: 2022-12-24 | Payer: Medicare Other | Source: Ambulatory Visit

## 2022-12-24 ENCOUNTER — Inpatient Hospital Stay (HOSPITAL_BASED_OUTPATIENT_CLINIC_OR_DEPARTMENT_OTHER): Payer: Medicare Other | Admitting: Oncology

## 2022-12-24 ENCOUNTER — Encounter: Payer: Self-pay | Admitting: Oncology

## 2022-12-24 ENCOUNTER — Ambulatory Visit: Payer: Medicare Other

## 2022-12-24 VITALS — BP 128/65 | HR 81 | Temp 97.8°F | Resp 18 | Wt 211.6 lb

## 2022-12-24 DIAGNOSIS — Z95828 Presence of other vascular implants and grafts: Secondary | ICD-10-CM

## 2022-12-24 DIAGNOSIS — Z79899 Other long term (current) drug therapy: Secondary | ICD-10-CM | POA: Diagnosis not present

## 2022-12-24 DIAGNOSIS — Z800 Family history of malignant neoplasm of digestive organs: Secondary | ICD-10-CM | POA: Diagnosis not present

## 2022-12-24 DIAGNOSIS — C3491 Malignant neoplasm of unspecified part of right bronchus or lung: Secondary | ICD-10-CM | POA: Diagnosis not present

## 2022-12-24 DIAGNOSIS — J90 Pleural effusion, not elsewhere classified: Secondary | ICD-10-CM

## 2022-12-24 DIAGNOSIS — Z923 Personal history of irradiation: Secondary | ICD-10-CM | POA: Diagnosis not present

## 2022-12-24 DIAGNOSIS — Z9221 Personal history of antineoplastic chemotherapy: Secondary | ICD-10-CM | POA: Diagnosis not present

## 2022-12-24 DIAGNOSIS — F411 Generalized anxiety disorder: Secondary | ICD-10-CM | POA: Diagnosis not present

## 2022-12-24 DIAGNOSIS — F419 Anxiety disorder, unspecified: Secondary | ICD-10-CM | POA: Diagnosis not present

## 2022-12-24 DIAGNOSIS — C801 Malignant (primary) neoplasm, unspecified: Secondary | ICD-10-CM

## 2022-12-24 DIAGNOSIS — Z8249 Family history of ischemic heart disease and other diseases of the circulatory system: Secondary | ICD-10-CM | POA: Diagnosis not present

## 2022-12-24 DIAGNOSIS — Z7982 Long term (current) use of aspirin: Secondary | ICD-10-CM | POA: Diagnosis not present

## 2022-12-24 DIAGNOSIS — Z87891 Personal history of nicotine dependence: Secondary | ICD-10-CM | POA: Diagnosis not present

## 2022-12-24 DIAGNOSIS — C3411 Malignant neoplasm of upper lobe, right bronchus or lung: Secondary | ICD-10-CM | POA: Diagnosis present

## 2022-12-24 DIAGNOSIS — Z801 Family history of malignant neoplasm of trachea, bronchus and lung: Secondary | ICD-10-CM | POA: Diagnosis not present

## 2022-12-24 MED ORDER — ONDANSETRON HCL 8 MG PO TABS
8.0000 mg | ORAL_TABLET | Freq: Three times a day (TID) | ORAL | 1 refills | Status: DC | PRN
Start: 2022-12-24 — End: 2023-05-26

## 2022-12-24 MED ORDER — LIDOCAINE-PRILOCAINE 2.5-2.5 % EX CREA
TOPICAL_CREAM | CUTANEOUS | 3 refills | Status: DC
Start: 2022-12-24 — End: 2023-05-26

## 2022-12-24 MED ORDER — DEXAMETHASONE 4 MG PO TABS
ORAL_TABLET | ORAL | 1 refills | Status: DC
Start: 2022-12-24 — End: 2023-05-26

## 2022-12-24 MED ORDER — PROCHLORPERAZINE MALEATE 10 MG PO TABS
10.0000 mg | ORAL_TABLET | Freq: Four times a day (QID) | ORAL | 1 refills | Status: DC | PRN
Start: 2022-12-24 — End: 2023-02-12

## 2022-12-24 NOTE — Assessment & Plan Note (Addendum)
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  Brain MRI is negative for metastasis. Thoracentesis cytology and biopsy via bronchoscopy both confirm adenocarcinoma Recurrent stage IV lung adenocarcinoma. Recommend systemic chemotherapy plus immunotherapy. [facility] NGS/PD-L1 are pending. recommend systemic chemotherapy with carboplatin/Taxol/bevacizumab.  Will add immunotherapy after confirming no actionable mutations on NGS.  If PD-L1 is above 50%, in the future, he may be maintained on monotherapy immunotherapy. Rationale and side effects were reviewed and discussed with patient and daughter.  They agree with the plan.  Follow up with Dr. Rushie Chestnut  for palliative radiation for occluded right upper lobe.

## 2022-12-24 NOTE — Assessment & Plan Note (Signed)
Recommend low-dose lorazepam 0.5 mg every 12 hours as needed for anxiety 

## 2022-12-24 NOTE — Assessment & Plan Note (Signed)
S/p thoracentesis.  Repeat CXR

## 2022-12-24 NOTE — Progress Notes (Signed)
Hematology/Oncology Progress note [phone] [phone]       Patient Care Team: [name], NP as PCP - General (Family Medicine) [name], RN as Registered Nurse [name], MD as Consulting Physician (Oncology) [name], MD as Referring Physician (Radiation Oncology) Wyn Quaker Marlow Baars, MD as Referring Physician (Vascular Surgery)   REASON FOR VISIT Follow up for immunotherapy treatment for stage IIIA right lung adenocarcinoma, and presumed stage I left lung cancer   ASSESSMENT & PLAN:   Cancer Staging  Primary lung adenocarcinoma, right [facility]) Staging form: Lung, AJCC 8th Edition - Clinical stage from 12/13/2017: Stage IIIA (cT3, cN1, cM0) - Signed by [name], MD on 12/13/2017 - Pathologic stage from 12/24/2022: Stage IV (pTX, pNX, pM1) - Signed by [name], MD on 12/24/2022   Primary lung adenocarcinoma, right (HCC) #History of stage IIIA cT3 N1 M0 lung adenocarcinoma, s/p concurrent chemoradiation [Aug 2019].  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  Brain MRI is negative for metastasis. Thoracentesis cytology and biopsy via bronchoscopy both confirm adenocarcinoma Recurrent stage IV lung adenocarcinoma. Recommend systemic chemotherapy plus immunotherapy. [facility] NGS/PD-L1 are pending. recommend systemic chemotherapy with carboplatin/Taxol/bevacizumab.  Will add immunotherapy after confirming no actionable mutations on NGS.  If PD-L1 is above 50%, in the future, he may be maintained on monotherapy immunotherapy. Rationale and side effects were reviewed and discussed with patient and daughter.  They agree with the plan.  Follow up with Dr. Rushie Chestnut  for palliative radiation for occluded right upper lobe.   Anxiety associated with cancer diagnosis (HCC) Recommend low-dose lorazepam 0.5 mg every 12 hours as  needed for anxiety  Port-A-Cath in place Port was removed, will need arrange for port placement  Pleural effusion S/p thoracentesis.  Repeat CXR   Orders Placed This Encounter  Procedures   CBC with Differential (Cancer Center Only)    Standing Status:   Future    Standing Expiration Date:   01/01/2024   CMP (Cancer Center only)    Standing Status:   Future    Standing Expiration Date:   01/01/2024   Total Protein, Urine dipstick    Standing Status:   Future    Standing Expiration Date:   01/01/2024   Ambulatory referral to Vascular Surgery    Referral Priority:   Routine    Referral Type:   Surgical    Referral Reason:   Specialty Services Required    Requested Specialty:   Vascular Surgery    Number of Visits Requested:   1   Follow up  1 week lab MD chemo All questions were answered. The patient knows to call the clinic with any problems, questions or concerns.  [name], MD, PhD [facility] Hematology Oncology 12/24/2022      Oncology History:  Mark Mccarty is a  72 y.o.  male with presents for stage IIIA right lung adenocarcinoma, and presumed stage I left lung cancer now with stage IV recurrent lung adenocarcinoma. Former 43 pack year smoking history.   Oncology History  Primary lung adenocarcinoma, right (HCC)  12/13/2017 Initial Diagnosis   cT3 N1 M0 right upper lobe lung adenocarcinoma  August 2019 Finished  concurrent chemoradiation.   03/17/2019 Finished 1 year of durvalumab treatment.   12/13/2017 Cancer Staging   Staging form: Lung, AJCC 8th Edition - Clinical stage from 12/13/2017: Stage IIIA (cT3, cN1,  cM0) - Signed by [name], MD on 12/13/2017   12/31/2017 - 02/19/2018 Chemotherapy   Concurrent chemotherapy carboplatin and Taxol. With radiation   03/24/2018 - 03/17/2019 Chemotherapy   LUNG DURVALUMAB Q14D x 1 year     12/01/2020 Imaging   PET  1. The 1.1 cm in long axis left apical nodule has maximum SUV of 2.2. Given the progression in size over the  last year, the appearance is suspicious for small/low-grade adenocarcinoma.  2. Upper normal sized AP window lymph nodes have maximum SUV just above blood pool, and are nonspecific.   Tumor board discussion 12/29/20  Slow-growing left upper lobe nodule.  CT-guided biopsy and biopsy via bronchoscopy are both of high risk due to underlying emphysema.  Consensus reached upon continue monitoring and repeat CT in 3 months.  If nodule continues to grow, he may benefit from empiric SBRT     03/13/2021 Imaging   CT without contrast showed slightly increase of left upper lobe nodule 12x33mm versus 11 x 8 mm.  Slightly increasing size.  Remains suspicious for bronchogenic neoplasm. Subtle nodularity along the posterior left upper lobe approximately 6 mm is unchanged.  Posttreatment changes distort the right hilum are unchanged.   04/20/2021 - 04/25/2021 Radiation Therapy   Patient received radiation to left upper lobe  nodule for presumed Stage I left upper lobe lung cancer.    07/18/2021 Imaging    CT chest without contrast showed stable posttreatment changes within the right lung.  No findings to suggest recurrent tumor or metastatic disease.  Interval decrease in size of previously characterized irregular nodule in the left upper lobe.    10/18/2021 Imaging    CT chest showed a stable examination of chronic postradiation masslike fibrosis in the right lung. Persistent subsolid nodule in the superior segment of the left lower lobe 1.3 x 1.2 cm.  Nonspecific.  COPD findings, aortic atherosclerosis.  Coronary artery disease.   01/17/2022 Imaging   01/17/2022, CT chest without contrast showed subsolid area in the left lower lobe is similar to prior imaging however with increasing nodularity.  Recommend PET scan for further evaluation Perihilar parenchymal destruction and scarring from prior radiation in the right hilum extending into the right upper and lower lobe unchanged. Left upper lobe parenchymal scarring  likely reflects treatment changes.  Marked pulmonary emphysema in the background.  Aortic atherosclerosis.   02/06/2022 Imaging   PET restaging 1. Focus of increasing nodularity in the subpleural left lower lobe identified as concerning on the recent CT chest shows only minimal FDG uptake on PET imaging today. While this is reassuring and the finding may reflect nfectious/inflammatory etiology, low-grade or well differentiated neoplasm can be poorly FDG avid. Close continued follow-up recommended. 2. Stable perifissural left lower lobe sub solid nodule, also with low level FDG uptake. Continued attention on follow-up recommende    05/17/2022 Imaging   1. Stable perihilar post radiation change and volume loss in the RIGHT hemithorax. 2. Stable mild mediastinal lymphadenopathy. 3. Peripheral nodule in the LEFT lower lobe measures slightly larger but has benign morphology. 4. Stable thickening RIGHT adrenal gland. 5. No new or progressive lung cancer identified   11/16/2022 Imaging   CT chest abdomen pelvis w contrast showed 1. New moderate right pleural effusion with diffuse, nodular,enhancing pleural thickening consistent with pleural metastatic disease. 2. Proximal obstruction of the right upper lobe segmental bronchi with complete atelectasis or consolidation of the right upper lobe, consistent with local malignant recurrence and postobstructive atelectasis of the  upper lobe. 3. Interval increase in circumferential soft tissue thickening about the right hilum. Slight interval increase in size in enlarged AP window lymph nodes. 4. Interval enlargement of a subpleural mass of the dependent superior segment left lower lobe consistent with an enlarging pulmonary metastasis or metachronous primary lung malignancy.5. Emphysema. 6. Coronary artery disease. 7. Prostatomegaly.    11/20/2022 Procedure   Thoracentesis right side Pathology is positive for adenocarcinoma.  Insufficient material for  ancillary medical testing.    12/03/2022 Imaging   PET scan showed 1. Hypermetabolic mass at the RIGHT hilum consistent with bronchogenic carcinoma. 2. Postobstructive collapse of the RIGHT upper lobe. 3. Uniform hypermetabolic pleural thickening in the RIGHT lung consistent with pleural metastasis. 4. Large RIGHT effusion. 5. Hypermetabolic nodule in the superior segment of the LEFT lower lobe consistent with pulmonary metastasis. Synchronous bronchogenic carcinoma versus metastatic lesion. 6. Hypermetabolic bilateral mediastinal adenopathy consistent with nodal metastasis. 7. No evidence of metastatic disease outside the thorax. 8. No skeletal metastasis.      12/17/2022 Procedure   Patient underwent biopsy via bronchoscopy for additional tissue Pathology showed  Right upper lobe lavage-positive for non-small cell carcinoma Right upper lobe ENB assisted biopsy showed adenocarcinoma, acinar and solid patterns. Right upper lobe FNA positive for non-small cell carcinoma.   12/24/2022 Cancer Staging   Staging form: Lung, AJCC 8th Edition - Pathologic stage from 12/24/2022: Stage IV (pTX, pNX, pM1) - Signed by [name], MD on 12/24/2022 Stage prefix: Initial diagnosis   01/01/2023 -  Chemotherapy   Patient is on Treatment Plan : LUNG NSCLC Carboplatin + Paclitaxel + Bevacizumab q21d       Multiple  liver nodules,  07/11/2020 MRI liver with and without contrast showed stable hypervascular lesions in the liver parenchyma without new or progressive findings.  Lesions remain most suggestive of benign etiology.  PET scan did not show evaluation activities.   INTERVAL HISTORY Mark Mccarty is a 72 y.o. male who has above history reviewed by me presents for follow-up of stage III A non-small cell lung cancer. Patient has had additional PET scan, thoracentesis as well as biopsy via bronchoscopy during interval.  See above.  Post bronchoscopy, patient was started on nasal cannula oxygen 2  L.  Today he was accompanied by her daughter Buren Kos to discuss results and management plan.  . Review of Systems  Constitutional:  Positive for fatigue and unexpected weight change. Negative for appetite change, chills and fever.  [name]:   Negative for hearing loss and voice change.   Eyes:  Negative for eye problems and icterus.  Respiratory:  Positive for shortness of breath. Negative for chest tightness and cough.   Cardiovascular:  Negative for chest pain and leg swelling.  Gastrointestinal:  Negative for abdominal distention and abdominal pain.  Endocrine: Negative for hot flashes.  Genitourinary:  Negative for difficulty urinating, dysuria and frequency.   Musculoskeletal:  Negative for arthralgias.  Skin:  Negative for itching and rash.  Neurological:  Negative for headaches, light-headedness and numbness.  [name]:  Negative for adenopathy. Does not bruise/bleed easily.  Psychiatric/Behavioral:  Negative for confusion.      MEDICAL HISTORY:  Past Medical History:  Diagnosis Date   Abnormal nuclear stress test    Anxiety    a.) on BZO (lorazepam) PRN   Asthma    Avascular necrosis of femoral head (HCC)    BPH (benign prostatic hyperplasia)    Cataract    a.) s/p extraction on LEFT   Colon  adenomas    COPD (chronic obstructive pulmonary disease) (HCC)    Coronary artery disease 11/11/2014   a.) MPI 11/11/2014: EF 56%, mild inf wall ischemia; b.) LHC 12/16/2014: 20% mRCA - med mgmt   DDD (degenerative disc disease), lumbar    Diverticulosis    DM type 2 (diabetes mellitus, type 2) (HCC)    Duodenitis    Dysphagia    Dyspnea    ED (erectile dysfunction)    a.) on PDE5i (sildenafil) PRN   Elevated PSA    Former smoker    Gastritis    GERD (gastroesophageal reflux disease)    [name]    Hilar mass    History of hiatal hernia    Hyperlipidemia    Hypertension    Hyperuricemia    Hypokalemia    Left ventricular diastolic dysfunction  10/04/2016   a.) TTE 10/04/2016: EF 50%, mild MR/TR/PR, mod AR, G1DD; b.) TTE 10/10/2017: EF 45%, triv PR, mild AR/MR/TR, G1DD; c.) TTE 03/30/2020: EF 50%, mild-mod AR, mild MR/TR/PR; d.) TTE 04/23/2022: EF >55%, mild LVH, mild LAE, triv TR, mild PR, mod AR/MR   Liver lesion    Obesity    Pedal edema    Peripheral polyneuropathy    Peyronie's disease 05/2022   Pleural effusion    Primary lung adenocarcinoma, right (HCC)    a.) stage IIIA (cT3, cN1, cM0) --> s/p concurrent chemoradiation 2019 --> carboplatin + pacilitaxel (12/31/2017 - 02/19/2018) followed by 1 year (03/24/2018 - 03/17/2019) adjuvant anti-PDL-1 mAB immunotherapy (durvalumab)   [name] (peptic ulcer disease)    Renal insufficiency    Umbilical hernia    [name]     SURGICAL HISTORY: Past Surgical History:  Procedure Laterality Date   CARDIAC CATHETERIZATION N/A 12/16/2014   Procedure: Left Heart Cath;  Surgeon: Marcina Millard, MD;  Location: [facility];  Service: Cardiovascular;  Laterality: N/A;   CATARACT EXTRACTION Left    COLONOSCOPY     COLONOSCOPY WITH PROPOFOL N/A 01/14/2020   Procedure: COLONOSCOPY WITH PROPOFOL;  Surgeon: Toledo, Boykin Nearing, MD;  Location: [facility];  Service: Gastroenterology;  Laterality: N/A;   CYST REMOVAL TRUNK     chest and back over time and it was removed   CYSTECTOMY     ENDOBRONCHIAL ULTRASOUND N/A 12/09/2017   Procedure: ENDOBRONCHIAL ULTRASOUND;  Surgeon: Shane Crutch, MD;  Location: [name];  Service: Pulmonary;  Laterality: N/A;   PORTA CATH INSERTION N/A 12/23/2017   Procedure: PORTA CATH INSERTION;  Surgeon: Annice Needy, MD;  Location: [facility];  Service: Cardiovascular;  Laterality: N/A;   PORTA CATH REMOVAL N/A 06/04/2022   Procedure: PORTA CATH REMOVAL;  Surgeon: Annice Needy, MD;  Location: [facility];  Service: Cardiovascular;  Laterality: N/A;   UPPER GI ENDOSCOPY     VIDEO BRONCHOSCOPY WITH ENDOBRONCHIAL  ULTRASOUND N/A 12/17/2022   Procedure: VIDEO BRONCHOSCOPY WITH ENDOBRONCHIAL ULTRASOUND;  Surgeon: Vida Rigger, MD;  Location: [name];  Service: Thoracic;  Laterality: N/A;    SOCIAL HISTORY: Social History   Socioeconomic History   Marital status: Divorced    Spouse name: Not on file   Number of children: Not on file   Years of education: Not on file   Highest education level: Not on file  Occupational History   Not on file  Tobacco Use   Smoking status: Former    Packs/day: 1.50    Years: 35.00    Additional pack years: 0.00  Total pack years: 52.50    Types: Cigarettes    Quit date: 06/22/2015    Years since quitting: 7.5    Passive exposure: Past   Smokeless tobacco: Never  Vaping Use   Vaping Use: Never used  Substance and Sexual Activity   Alcohol use: Not Currently    Comment: weekends   Drug use: No   Sexual activity: Not Currently  Other Topics Concern   Not on file  Social History Narrative   Lives with son [name].   Social Determinants of Health   Financial Resource Strain: Not on file  Food Insecurity: Not on file  Transportation Needs: Not on file  Physical Activity: Not on file  Stress: Not on file  Social Connections: Not on file  Intimate Partner Violence: Not on file    FAMILY HISTORY: Family History  Problem Relation Age of Onset   Hypertension Mother    Breast cancer Mother    Heart attack Mother    Lung cancer Father    Heart disease Sister    Diabetes Sister    Colon cancer Sister     ALLERGIES:  is allergic to penicillins.  MEDICATIONS:  Current Outpatient Medications  Medication Sig Dispense Refill   acetaminophen (TYLENOL) 500 MG tablet Take 500 mg by mouth as needed.     albuterol (VENTOLIN HFA) 108 (90 Base) MCG/ACT inhaler Inhale 2 puffs into the lungs every 4 (four) hours as needed.     amLODipine (NORVASC) 10 MG tablet Take 10 mg by mouth every morning.     ascorbic acid (VITAMIN C) 1000 MG tablet Take 1,000 mg  by mouth daily.     aspirin 81 MG tablet Take 81 mg by mouth daily.     carvedilol (COREG) 25 MG tablet Take 25 mg by mouth 2 (two) times daily with a meal.     cetirizine (ZYRTEC) 10 MG tablet Take 1 tablet by mouth as needed.     cholecalciferol (VITAMIN D3) 25 MCG (1000 UNIT) tablet Take 1,000 Units by mouth daily.     Fluticasone-Umeclidin-Vilant (TRELEGY ELLIPTA) 200-62.5-25 MCG/ACT AEPB Inhale 1 puff into the lungs every morning.     gabapentin (NEURONTIN) 300 MG capsule Take 300 mg by mouth at bedtime as needed.      ibuprofen (ADVIL,MOTRIN) 600 MG tablet Take 600 mg by mouth every 6 (six) hours as needed.     ipratropium-albuterol (DUONEB) 0.5-2.5 (3) MG/3ML SOLN Take 3 mLs by nebulization every 6 (six) hours as needed.     lisinopril (PRINIVIL,ZESTRIL) 40 MG tablet Take 40 mg by mouth every morning.     LORazepam (ATIVAN) 0.5 MG tablet Take 1 tablet (0.5 mg total) by mouth every 12 (twelve) hours as needed for anxiety. 30 tablet 0   potassium chloride (KLOR-CON) 10 MEQ tablet Take 1 tablet (10 mEq total) by mouth daily. (Patient taking differently: Take 10 mEq by mouth every morning.) 90 tablet 1   pravastatin (PRAVACHOL) 20 MG tablet Take 20 mg by mouth at bedtime.     senna (SENOKOT) 8.6 MG TABS tablet Take 2 tablets (17.2 mg total) by mouth daily. (Patient taking differently: Take 2 tablets by mouth as needed.) 120 each 0   sildenafil (VIAGRA) 50 MG tablet Take 1-2 tablets (50-100 mg total) by mouth daily as needed for erectile dysfunction. 30 tablet 6   spironolactone (ALDACTONE) 25 MG tablet Take by mouth.     tamsulosin (FLOMAX) 0.4 MG CAPS capsule Take 0.4 mg by mouth daily  after breakfast.     torsemide (DEMADEX) 20 MG tablet Take 20 mg by mouth every morning.     Vibegron (GEMTESA) 75 MG TABS Take 1 tablet (75 mg total) by mouth daily. (Patient taking differently: Take 1 tablet by mouth every morning.) 30 tablet 0   No current facility-administered medications for this visit.    Facility-Administered Medications Ordered in Other Visits  Medication Dose Route Frequency Provider Last Rate Last Admin   sodium chloride flush (NS) 0.9 % injection 10 mL  10 mL Intravenous PRN [name], MD   10 mL at 03/18/18 0858     PHYSICAL EXAMINATION: ECOG PERFORMANCE STATUS: 0 - Asymptomatic Vitals:   12/24/22 1419  BP: 128/65  Pulse: 81  Resp: 18  Temp: 97.8 F (36.6 C)  SpO2: 92%   Filed Weights   12/24/22 1419  Weight: 211 lb 9.6 oz (96 kg)    Physical Exam Constitutional:      General: He is not in acute distress. [name]:     Head: Normocephalic and atraumatic.  Eyes:     General: No scleral icterus.    Pupils: Pupils are equal, round, and reactive to light.  Cardiovascular:     Rate and Rhythm: Normal rate and regular rhythm.     Heart sounds: Normal heart sounds.  Pulmonary:     Effort: Pulmonary effort is normal. No respiratory distress.     Comments: Decreased breath sound bilaterally Abdominal:     General: Bowel sounds are normal. There is no distension.     Palpations: Abdomen is soft.  Musculoskeletal:        General: No deformity. Normal range of motion.     Cervical back: Normal range of motion and neck supple.  Lymphadenopathy:     Cervical: No cervical adenopathy.  Skin:    General: Skin is warm and dry.     Findings: No erythema or rash.     Comments: Left supraclavicular fossa fullness, chronic.  Neurological:     Mental Status: He is alert and oriented to person, place, and time. Mental status is at baseline.     Cranial Nerves: No cranial nerve deficit.      LABORATORY DATA:  I have reviewed the data as listed    Latest Ref Rng & Units 12/17/2022    9:36 PM 11/21/2022   10:13 AM 05/16/2022    8:54 AM  CBC  WBC 4.0 - 10.5 K/uL 8.3  15.3  6.8   Hemoglobin 13.0 - 17.0 g/dL 16.1  09.6  04.5   Hematocrit 39.0 - 52.0 % 45.0  45.7  43.3   Platelets 150 - 400 K/uL 359  270  235       Latest Ref Rng & Units 12/17/2022    9:36 PM  12/17/2022    2:56 PM 11/21/2022   10:13 AM  CMP  Glucose 70 - 99 mg/dL  409  811   BUN 8 - 23 mg/dL  11  12   Creatinine 9.14 - 1.24 mg/dL 7.82  9.56  2.13   Sodium 135 - 145 mmol/L  138  139   Potassium 3.5 - 5.1 mmol/L  3.6  3.7   Chloride 98 - 111 mmol/L  101  101   CO2 22 - 32 mmol/L  29  29   Calcium 8.9 - 10.3 mg/dL  8.1  9.2   Total Protein 6.5 - 8.1 g/dL  6.4  7.3   Total Bilirubin 0.3 - 1.2  mg/dL  0.6  0.8   Alkaline Phos 38 - 126 U/L  58  58   AST 15 - 41 U/L  16  16   ALT 0 - 44 U/L  13  12      RADIOGRAPHIC STUDIES: I have personally reviewed the radiological images as listed and agreed with the findings in the report.Varney Biles Chest Port 1 View  Result Date: 12/18/2022 CLINICAL DATA:  Pleural effusion. EXAM: PORTABLE CHEST 1 VIEW COMPARISON:  12/17/2022 FINDINGS: Stable asymmetric elevation right hemidiaphragm. Interval decrease in right pleural effusion. Bibasilar atelectasis again noted. Cardiopericardial silhouette is at upper limits of normal for size. No acute bony abnormality. Soft tissue fullness in the medial right apex is similar to prior, compatible with right upper lobe collapse. IMPRESSION: 1. Interval decrease in right pleural effusion. 2. Persistent right upper lobe collapse. Electronically Signed   By: Kennith Center M.D.   On: 12/18/2022 12:20   US THORACENTESIS ASP PLEURAL SPACE W/IMG GUIDE  Result Date: 12/17/2022 INDICATION: Patient with right lung cancer with recurrent pleural effusion. Interventional radiology asked to perform a therapeutic thoracentesis. EXAM: ULTRASOUND GUIDED THORACENTESIS MEDICATIONS: 1% lidocaine 10 mL COMPLICATIONS: None immediate. PROCEDURE: An ultrasound guided thoracentesis was thoroughly discussed with the patient and questions answered. The benefits, risks, alternatives and complications were also discussed. The patient understands and wishes to proceed with the procedure. Written consent was obtained. Ultrasound was performed to  localize and mark an adequate pocket of fluid in the right chest. The area was then prepped and draped in the normal sterile fashion. 1% Lidocaine was used for local anesthesia. Under ultrasound guidance a 6 Fr Safe-T-Centesis catheter was introduced. Thoracentesis was performed. The catheter was removed and a dressing applied. FINDINGS: A total of approximately 1.5 L of amber colored fluid was removed. IMPRESSION: Successful ultrasound guided right thoracentesis yielding 1.5 L of pleural fluid. Procedure performed by Alwyn Ren NP and supervised by Dr. Archer Asa. Electronically Signed   By: Malachy Moan M.D.   On: 12/17/2022 20:15   CT SUPER D CHEST WO MONARCH PILOT  Result Date: 12/17/2022 CLINICAL DATA:  Non-small cell lung cancer, ongoing immunotherapy, pleural metastatic disease * Tracking Code: BO * EXAM: CT CHEST WITHOUT CONTRAST TECHNIQUE: Multidetector CT imaging of the chest was performed using thin slice collimation for electromagnetic bronchoscopy planning purposes, without intravenous contrast. RADIATION DOSE REDUCTION: This exam was performed according to the departmental dose-optimization program which includes automated exposure control, adjustment of the mA and/or kV according to patient size and/or use of iterative reconstruction technique. COMPARISON:  Multiple exams, including PET-CT 12/03/2022 FINDINGS: Cardiovascular: Coronary, aortic arch, and branch vessel atherosclerotic vascular disease. Trace pericardial effusion. Mediastinum/Nodes: Mediastinal adenopathy was recently hypermetabolic with representative nodes including a 1.1 cm AP window lymph node on image 58 of series 2 and a 1.6 cm subcarinal lymph node on image 72 series 2. Lungs/Pleura: Very large right pleural effusion with only a small amount of aerated right middle lobe and complete atelectasis of the right lower lobe and right upper lobe. Extensive rind like density along the pleural is irregular and compatible with  pleural metastatic disease. There is also nodular infiltration of the pleural adipose tissue at the right anterior lung base adjacent to the pericardium compatible with tumor infiltration. The patient has a known right hilar and infrahilar mass which is obscured by the surrounding atelectasis. Severe emphysema. A pleural-based nodule in the left lower lobe superior segment measures 2.2 by 2.4 cm on image 79 of  series 4 and was hypermetabolic on the 12/03/2022 PET-CT compatible with a contralateral metastatic lesion. Upper Abdomen: No adrenal mass. Scattered colonic diverticula. Abdominal aortic atherosclerosis. Musculoskeletal: Probable hemangioma at the L2 vertebral level. Not previously hypermetabolic. IMPRESSION: 1. Very large right pleural effusion with only a small amount of aerated right middle lobe and complete atelectasis of the right lower lobe and right upper lobe. 2. Extensive rind like density along the pleural is irregular and compatible with pleural metastatic disease. There is also nodular infiltration of the pleural adipose tissue at the right anterior lung base adjacent to the pericardium compatible with tumor infiltration. 3. The patient has a known right hilar and infrahilar mass which is obscured by the surrounding atelectasis. 4. Mediastinal adenopathy was recently hypermetabolic on the 12/03/2022 PET-CT compatible with metastatic disease. 5. A pleural-based nodule in the left lower lobe superior segment measures 2.2 by 2.4 cm and was hypermetabolic on the 12/03/2022 PET-CT compatible with a contralateral metastatic lesion. 6. Severe emphysema. 7. Coronary, aortic arch, and branch vessel atherosclerotic vascular disease. 8. Trace pericardial effusion. 9. Probable hemangioma at the L2 vertebral level. Aortic Atherosclerosis (ICD10-I70.0) and Emphysema (ICD10-J43.9). Electronically Signed   By: Gaylyn Rong M.D.   On: 12/17/2022 18:06   DG Chest Port 1 View  Result Date:  12/17/2022 CLINICAL DATA:  Status post bronchoscopy EXAM: PORTABLE CHEST 1 VIEW COMPARISON:  11/20/2022 FINDINGS: There is only a miniscule amount of aerated lung in the right mid upper chest, otherwise the right hemithorax is completely opacified. Indistinct pulmonary vasculature on the left possibly from pulmonary venous hypertension. No blunting of the left costophrenic angle. No visible pneumothorax. IMPRESSION: 1. Near complete opacification of the right hemithorax, with only a miniscule amount of aerated lung in the right mid upper chest. 2. Indistinct pulmonary vasculature on the left possibly from pulmonary venous hypertension. Electronically Signed   By: Gaylyn Rong M.D.   On: 12/17/2022 17:56   DG Chest Port 1 View  Result Date: 12/17/2022 CLINICAL DATA:  Status post thoracentesis EXAM: PORTABLE CHEST 1 VIEW COMPARISON:  Chest x-ray dated June 17th 2020 FINDINGS: Visualized cardiac and mediastinal contours are unchanged. Moderate right pleural effusion, decreased in size when compared with the prior exam. Lung predominant heterogeneous opacities, likely atelectasis. Persistent right upper lobe collapse. No evidence of pneumothorax. IMPRESSION: 1. Moderate right pleural effusion, decreased in size. 2. No evidence of pneumothorax. 3. Persistent right upper lobe collapse. Electronically Signed   By: Allegra Lai M.D.   On: 12/17/2022 16:42   MR Brain W Wo Contrast  Result Date: 12/16/2022 CLINICAL DATA:  Lung cancer, evaluate for metastatic disease EXAM: MRI HEAD WITHOUT AND WITH CONTRAST TECHNIQUE: Multiplanar, multiecho pulse sequences of the brain and surrounding structures were obtained without and with intravenous contrast. CONTRAST:  10mL GADAVIST GADOBUTROL 1 MMOL/ML IV SOLN COMPARISON:  08/25/2019 FINDINGS: Brain: No restricted diffusion to suggest acute or subacute infarct. No abnormal parenchymal or meningeal enhancement. No acute hemorrhage, mass, mass effect, or midline shift.  No hydrocephalus or extra-axial collection. Partial empty sella. Craniocervical junction. No hemosiderin deposition to suggest remote hemorrhage. Normal cerebral volume for age. Scattered T2 hyperintense signal in the periventricular white matter, likely the sequela of mild chronic small vessel ischemic disease. Vascular: Normal arterial flow voids. Skull and upper cervical spine: Normal marrow signal. Sinuses/Orbits: Clear paranasal sinuses. No acute finding in the orbits. Status post left lens replacement. Other: The mastoid air cells are well aerated. IMPRESSION: No acute intracranial process. No evidence of  metastatic disease in the brain. Electronically Signed   By: Wiliam Ke M.D.   On: 12/16/2022 03:30   NM PET Image Restag (PS) Skull Base To Thigh  Result Date: 12/03/2022 CLINICAL DATA:  Subsequent treatment strategy for non-small cell lung cancer. EXAM: NUCLEAR MEDICINE PET SKULL BASE TO THIGH TECHNIQUE: 12.1 mCi F-18 FDG was injected intravenously. Full-ring PET imaging was performed from the skull base to thigh after the radiotracer. CT data was obtained and used for attenuation correction and anatomic localization. Fasting blood glucose: 101 mg/dl COMPARISON:  PET-CT 42/59/5638 CT 11/16/2022 FINDINGS: Mediastinal blood pool activity: SUV max 2.1 Liver activity: SUV max NA NECK: No hypermetabolic lymph nodes in the neck. Incidental CT findings: None. CHEST: Rim intense hypermetabolic activity within the RIGHT hemithorax pleural space. This thin rim of metabolic 2 borders in large RIGHT pleural effusion. More nodular pleural thickening in the medial aspect of the RIGHT middle lobe with SUV max equal 8.5. Nodular thickening measuring up to 9 mm (image 70/4). Hypermetabolic thickening at the RIGHT hilum measures 3.5 cm surrounds the RIGHT upper lobe bronchi. Activity is intense with SUV max equal 8.5. There is postobstructive collapse of the RIGHT upper lobe. Large RIGHT effusion. Within the superior  segment LEFT lower lobe 22 mm hypermetabolic nodule with SUV max equal 9.5. There is small bilateral hypermetabolic mediastinal adenopathy and subcarinal adenopathy. Incidental CT findings: None. ABDOMEN/PELVIS: Limited view of the liver, kidneys, pancreas are unremarkable. Normal adrenal glands. Incidental CT findings: Adrenal glands normal. No metastatic adenopathy. No liver metastasis. SKELETON: No focal hypermetabolic activity to suggest skeletal metastasis. Several sclerotic lesions in the pelvis and spine without metabolic activity. Incidental CT findings: None. IMPRESSION: 1. Hypermetabolic mass at the RIGHT hilum consistent with bronchogenic carcinoma. 2. Postobstructive collapse of the RIGHT upper lobe. 3. Uniform hypermetabolic pleural thickening in the RIGHT lung consistent with pleural metastasis. 4. Large RIGHT effusion. 5. Hypermetabolic nodule in the superior segment of the LEFT lower lobe consistent with pulmonary metastasis. Synchronous bronchogenic carcinoma versus metastatic lesion. 6. Hypermetabolic bilateral mediastinal adenopathy consistent with nodal metastasis. 7. No evidence of metastatic disease outside the thorax. 8. No skeletal metastasis. Electronically Signed   By: Genevive Bi M.D.   On: 12/03/2022 16:33   US THORACENTESIS ASP PLEURAL SPACE W/IMG GUIDE  Result Date: 11/20/2022 INDICATION: Right pleural effusion EXAM: ULTRASOUND GUIDED RIGHT THORACENTESIS MEDICATIONS: 7 cc 1% lidocaine COMPLICATIONS: None immediate. PROCEDURE: An ultrasound guided thoracentesis was thoroughly discussed with the patient and questions answered. The benefits, risks, alternatives and complications were also discussed. The patient understands and wishes to proceed with the procedure. Written consent was obtained. Ultrasound was performed to localize and mark an adequate pocket of fluid in the right chest. The area was then prepped and draped in the normal sterile fashion. 1% Lidocaine was used for  local anesthesia. Under ultrasound guidance a catheter was introduced. Thoracentesis was performed. The catheter was removed and a dressing applied. FINDINGS: A total of approximately 1 L of hazy yellow fluid was removed. Samples were sent to the laboratory as requested by the clinical team. IMPRESSION: Successful ultrasound guided right thoracentesis yielding 1 L of pleural fluid. Follow-up chest x-ray revealed no evidence of pneumothorax. Procedure performed by Mina Marble, PA-C Electronically Signed   By: Olive Bass M.D.   On: 11/20/2022 15:47   DG Chest Port 1 View  Result Date: 11/20/2022 CLINICAL DATA:  s/p thoracentesis, right EXAM: PORTABLE CHEST 1 VIEW COMPARISON:  Multiple prior CTs FINDINGS: There is right  upper lobe collapse. The heart appears within normal limits. No significant pleural effusion appreciated status post right thoracentesis. No pneumothorax. The left lung is clear. No acute osseous abnormality. IMPRESSION: 1. No pneumothorax status post right thoracentesis. No significant residual pleural effusion. 2. Known right upper lobe collapse. Electronically Signed   By: Olive Bass M.D.   On: 11/20/2022 14:57   CT CHEST ABDOMEN PELVIS W CONTRAST  Result Date: 11/16/2022 CLINICAL DATA:  Lung cancer restaging, ongoing immunotherapy * Tracking Code: BO * EXAM: CT CHEST, ABDOMEN, AND PELVIS WITH CONTRAST TECHNIQUE: Multidetector CT imaging of the chest, abdomen and pelvis was performed following the standard protocol during bolus administration of intravenous contrast. RADIATION DOSE REDUCTION: This exam was performed according to the departmental dose-optimization program which includes automated exposure control, adjustment of the mA and/or kV according to patient size and/or use of iterative reconstruction technique. CONTRAST:  OMNIPAQUE IOHEXOL 300 MG/ML  SOLN COMPARISON:  CT chest, 05/16/2022, PET-CT, 02/05/2022 FINDINGS: CT CHEST FINDINGS Cardiovascular: Aortic  atherosclerosis. Normal heart size. Scattered left coronary artery calcifications. No pericardial effusion. Mediastinum/Nodes: Interval increase in circumferential soft tissue thickening about the right hilum (series 2, image 24). Slight interval increase in size in enlarged AP window lymph nodes, measuring up to 2.3 x 1.1 cm, previously 1.7 x 0.9 cm (series 2, image 42). Thyroid gland, trachea, and esophagus demonstrate no significant findings. Lungs/Pleura: New moderate right pleural effusion with diffuse, nodular, enhancing pleural thickening (series 2, image 29). Proximal obstruction of the right upper lobe segmental bronchi with complete atelectasis or consolidation of the right upper lobe, new compared to prior examination (series 2, image 19). Interval enlargement of a subpleural mass of the dependent superior segment left lower lobe, measuring 2.4 x 2.1 cm, previously 1.8 x 1.2 cm (series 3, image 68). Severe underlying emphysema. Musculoskeletal: No chest wall abnormality. No acute osseous findings. CT ABDOMEN PELVIS FINDINGS Hepatobiliary: No solid liver abnormality is seen. No gallstones, gallbladder wall thickening, or biliary dilatation. Pancreas: Unremarkable. No pancreatic ductal dilatation or surrounding inflammatory changes. Spleen: Normal in size without significant abnormality. Adrenals/Urinary Tract: Adrenal glands are unremarkable. Kidneys are normal, without renal calculi, solid lesion, or hydronephrosis. Bladder is unremarkable. Stomach/Bowel: Stomach is within normal limits. Appendix appears normal. No evidence of bowel wall thickening, distention, or inflammatory changes. Pancolonic diverticulosis. Moderate burden of stool throughout the colon. Vascular/Lymphatic: Aortic atherosclerosis. No enlarged abdominal or pelvic lymph nodes. Reproductive: Prostatomegaly. Other: No abdominal wall hernia or abnormality. No ascites. Musculoskeletal: No acute osseous findings. Unchanged sclerotic,  trabeculated vertebral body hemangiomata of T10 and L2 (series 6, image 100). No suspicious osseous lesions. 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 IMPRESSION: 1. New moderate right pleural effusion with diffuse, nodular, enhancing pleural thickening consistent with pleural metastatic disease. 2. Proximal obstruction of the right upper lobe segmental bronchi with complete atelectasis or consolidation of the right upper lobe, consistent with local malignant recurrence and postobstructive atelectasis of the upper lobe. 3. Interval increase in circumferential soft tissue thickening about the right hilum. Slight interval increase in size in enlarged AP window lymph nodes. 4. Interval enlargement of a subpleural mass of the dependent superior segment left lower lobe consistent with an enlarging pulmonary metastasis or metachronous primary lung malignancy. 5. Emphysema. 6. Coronary artery disease. 7. Prostatomegaly. These results will be called to the ordering clinician or representative by the Radiologist Assistant, and communication documented in the PACS or [employer]. Aortic Atherosclerosis (ICD10-I70.0) and Emphysema (ICD10-J43.9). Electronically Signed  By: Jearld Lesch M.D.   On: 11/16/2022 17:13

## 2022-12-24 NOTE — Assessment & Plan Note (Signed)
Port was removed, will need arrange for port placement

## 2022-12-24 NOTE — Progress Notes (Signed)
DISCONTINUE ON PATHWAY REGIMEN - Non-Small Cell Lung     A cycle is every 14 days:     Durvalumab   **Always confirm dose/schedule in your pharmacy ordering system**  REASON: Disease Progression PRIOR TREATMENT: [id number]: Durvalumab 10 mg/kg q14 Days x up to 12 Months TREATMENT RESPONSE: Progressive Disease (PD)  START ON PATHWAY REGIMEN - Non-Small Cell Lung     A cycle is every 21 days:     Bevacizumab-xxxx      Paclitaxel      Carboplatin   **Always confirm dose/schedule in your pharmacy ordering system**  Patient Characteristics: Stage IV Metastatic, Nonsquamous, Awaiting Molecular Test Results and Need to Start Chemotherapy, PS = 0, 1 Therapeutic Status: Stage IV Metastatic Histology: Nonsquamous Cell Broad Molecular Profiling Status: Awaiting Molecular Test Results and Need to Start Chemotherapy ECOG Performance Status: 1 Intent of Therapy: Non-Curative / Palliative Intent, Discussed with Patient

## 2022-12-25 ENCOUNTER — Telehealth: Payer: Self-pay

## 2022-12-25 ENCOUNTER — Ambulatory Visit
Admission: RE | Admit: 2022-12-25 | Discharge: 2022-12-25 | Disposition: A | Discharge status: Home / Self Care | Payer: Medicare Other | Source: Ambulatory Visit | Attending: Oncology | Admitting: Oncology

## 2022-12-25 ENCOUNTER — Ambulatory Visit
Admission: RE | Admit: 2022-12-25 | Discharge: 2022-12-25 | Disposition: A | Discharge status: Home / Self Care | Payer: Medicare Other | Attending: Oncology | Admitting: Oncology

## 2022-12-25 ENCOUNTER — Other Ambulatory Visit: Payer: Self-pay

## 2022-12-25 ENCOUNTER — Ambulatory Visit: Payer: Medicare Other

## 2022-12-25 ENCOUNTER — Ambulatory Visit
Admission: RE | Admit: 2022-12-25 | Discharge: 2022-12-25 | Disposition: A | Discharge status: Home / Self Care | Payer: Medicare Other | Source: Ambulatory Visit | Attending: Radiation Oncology | Admitting: Radiation Oncology

## 2022-12-25 DIAGNOSIS — J90 Pleural effusion, not elsewhere classified: Secondary | ICD-10-CM

## 2022-12-25 DIAGNOSIS — Z510 Encounter for antineoplastic radiation therapy: Secondary | ICD-10-CM | POA: Diagnosis not present

## 2022-12-25 LAB — RAD ONC ARIA SESSION SUMMARY
Course Elapsed Days: 0
Plan Fractions Treated to Date: 1
Plan Prescribed Dose Per Fraction: 2 Gy
Plan Total Fractions Prescribed: 20
Plan Total Prescribed Dose: 40 Gy
Reference Point Dosage Given to Date: 2 Gy
Reference Point Session Dosage Given: 2 Gy
Session Number: 1

## 2022-12-25 NOTE — Telephone Encounter (Signed)
Called and spoke to patient's daughter and informed her that Dr. Cathie Hoops would like for patient to get CXR on 6/25, 6/26 to monitor pleural effusion level. Entered CXR order.

## 2022-12-25 NOTE — Telephone Encounter (Signed)
-----   Message from [name], MD sent at 12/24/2022 10:57 PM EDT ----- Please also arrange patient to repeat CXR on 6/25 or 6/26, in order to monitor pleural effusion level.

## 2022-12-26 ENCOUNTER — Other Ambulatory Visit: Payer: Self-pay

## 2022-12-26 ENCOUNTER — Ambulatory Visit
Admission: RE | Admit: 2022-12-26 | Discharge: 2022-12-26 | Disposition: A | Discharge status: Home / Self Care | Payer: Medicare Other | Source: Ambulatory Visit | Attending: Radiation Oncology | Admitting: Radiation Oncology

## 2022-12-26 DIAGNOSIS — Z510 Encounter for antineoplastic radiation therapy: Secondary | ICD-10-CM | POA: Diagnosis not present

## 2022-12-26 LAB — RAD ONC ARIA SESSION SUMMARY
Course Elapsed Days: 1
Plan Fractions Treated to Date: 2
Plan Prescribed Dose Per Fraction: 2 Gy
Plan Total Fractions Prescribed: 20
Plan Total Prescribed Dose: 40 Gy
Reference Point Dosage Given to Date: 4 Gy
Reference Point Session Dosage Given: 2 Gy
Session Number: 2

## 2022-12-27 ENCOUNTER — Telehealth (INDEPENDENT_AMBULATORY_CARE_PROVIDER_SITE_OTHER): Payer: Self-pay

## 2022-12-27 ENCOUNTER — Other Ambulatory Visit: Payer: Medicare Other

## 2022-12-27 ENCOUNTER — Other Ambulatory Visit: Payer: Self-pay

## 2022-12-27 ENCOUNTER — Ambulatory Visit
Admission: RE | Admit: 2022-12-27 | Discharge: 2022-12-27 | Disposition: A | Discharge status: Home / Self Care | Payer: Medicare Other | Source: Ambulatory Visit | Attending: Radiation Oncology | Admitting: Radiation Oncology

## 2022-12-27 DIAGNOSIS — Z510 Encounter for antineoplastic radiation therapy: Secondary | ICD-10-CM | POA: Diagnosis not present

## 2022-12-27 DIAGNOSIS — J90 Pleural effusion, not elsewhere classified: Secondary | ICD-10-CM

## 2022-12-27 LAB — RAD ONC ARIA SESSION SUMMARY
Course Elapsed Days: 2
Plan Fractions Treated to Date: 3
Plan Prescribed Dose Per Fraction: 2 Gy
Plan Total Fractions Prescribed: 20
Plan Total Prescribed Dose: 40 Gy
Reference Point Dosage Given to Date: 6 Gy
Reference Point Session Dosage Given: 2 Gy
Session Number: 3

## 2022-12-27 NOTE — Telephone Encounter (Signed)
Spoke with the patient and he is scheduled on 01/08/23 for a port placement with Dr. Wyn Quaker at the [facility] with a 11:00 am arrival time. Pre-procedure instructions were discussed and will be sent to Mychart and mailed. Patient was offered 12/28/22 and 01/01/23 and declined both.

## 2022-12-28 ENCOUNTER — Ambulatory Visit
Admission: RE | Admit: 2022-12-28 | Discharge: 2022-12-28 | Disposition: A | Discharge status: Home / Self Care | Payer: Medicare Other | Source: Ambulatory Visit | Attending: Interventional Radiology | Admitting: Interventional Radiology

## 2022-12-28 ENCOUNTER — Other Ambulatory Visit: Payer: Self-pay

## 2022-12-28 ENCOUNTER — Ambulatory Visit
Admission: RE | Admit: 2022-12-28 | Discharge: 2022-12-28 | Disposition: A | Discharge status: Home / Self Care | Payer: Medicare Other | Source: Ambulatory Visit | Attending: Radiation Oncology | Admitting: Radiation Oncology

## 2022-12-28 ENCOUNTER — Other Ambulatory Visit: Payer: Self-pay | Admitting: Interventional Radiology

## 2022-12-28 ENCOUNTER — Ambulatory Visit
Admission: RE | Admit: 2022-12-28 | Discharge: 2022-12-28 | Disposition: A | Discharge status: Home / Self Care | Payer: Medicare Other | Source: Ambulatory Visit | Attending: Oncology | Admitting: Oncology

## 2022-12-28 DIAGNOSIS — J90 Pleural effusion, not elsewhere classified: Secondary | ICD-10-CM

## 2022-12-28 DIAGNOSIS — Z510 Encounter for antineoplastic radiation therapy: Secondary | ICD-10-CM | POA: Diagnosis not present

## 2022-12-28 LAB — RAD ONC ARIA SESSION SUMMARY
Course Elapsed Days: 3
Plan Fractions Treated to Date: 4
Plan Prescribed Dose Per Fraction: 2 Gy
Plan Total Fractions Prescribed: 20
Plan Total Prescribed Dose: 40 Gy
Reference Point Dosage Given to Date: 8 Gy
Reference Point Session Dosage Given: 2 Gy
Session Number: 4

## 2022-12-28 MED ORDER — LIDOCAINE HCL (PF) 1 % IJ SOLN
10.0000 mL | Freq: Once | INTRAMUSCULAR | Status: AC
  Administered 2022-12-28: 10 mL via INTRADERMAL
  Filled 2022-12-28: qty 10

## 2022-12-31 ENCOUNTER — Encounter: Payer: Self-pay | Admitting: Oncology

## 2022-12-31 ENCOUNTER — Ambulatory Visit
Admission: RE | Admit: 2022-12-31 | Discharge: 2022-12-31 | Disposition: A | Discharge status: Home / Self Care | Payer: Medicare Other | Source: Ambulatory Visit | Attending: Radiation Oncology | Admitting: Radiation Oncology

## 2022-12-31 ENCOUNTER — Other Ambulatory Visit: Payer: Self-pay

## 2022-12-31 DIAGNOSIS — Z87891 Personal history of nicotine dependence: Secondary | ICD-10-CM | POA: Diagnosis not present

## 2022-12-31 DIAGNOSIS — Z510 Encounter for antineoplastic radiation therapy: Secondary | ICD-10-CM | POA: Diagnosis not present

## 2022-12-31 DIAGNOSIS — C3412 Malignant neoplasm of upper lobe, left bronchus or lung: Secondary | ICD-10-CM | POA: Diagnosis present

## 2022-12-31 LAB — RAD ONC ARIA SESSION SUMMARY
Course Elapsed Days: 6
Plan Fractions Treated to Date: 5
Plan Prescribed Dose Per Fraction: 2 Gy
Plan Total Fractions Prescribed: 20
Plan Total Prescribed Dose: 40 Gy
Reference Point Dosage Given to Date: 10 Gy
Reference Point Session Dosage Given: 2 Gy
Session Number: 5

## 2022-12-31 MED FILL — Fosaprepitant Dimeglumine For IV Infusion 150 MG (Base Eq): INTRAVENOUS | Qty: 5 | Status: AC

## 2022-12-31 MED FILL — Dexamethasone Sodium Phosphate Inj 100 MG/10ML: INTRAMUSCULAR | Qty: 1 | Status: AC

## 2023-01-01 ENCOUNTER — Ambulatory Visit: Payer: Medicare Other

## 2023-01-01 ENCOUNTER — Ambulatory Visit: Payer: Medicare Other | Admitting: Oncology

## 2023-01-01 ENCOUNTER — Inpatient Hospital Stay (HOSPITAL_BASED_OUTPATIENT_CLINIC_OR_DEPARTMENT_OTHER): Payer: Medicare Other | Admitting: Oncology

## 2023-01-01 ENCOUNTER — Other Ambulatory Visit: Payer: Self-pay

## 2023-01-01 ENCOUNTER — Encounter: Payer: Self-pay | Admitting: Oncology

## 2023-01-01 ENCOUNTER — Inpatient Hospital Stay: Payer: Medicare Other

## 2023-01-01 ENCOUNTER — Ambulatory Visit
Admission: RE | Admit: 2023-01-01 | Discharge: 2023-01-01 | Disposition: A | Discharge status: Home / Self Care | Payer: Medicare Other | Source: Ambulatory Visit | Attending: Radiation Oncology | Admitting: Radiation Oncology

## 2023-01-01 ENCOUNTER — Other Ambulatory Visit: Payer: Medicare Other

## 2023-01-01 VITALS — BP 124/68 | HR 82 | Temp 95.9°F | Ht 72.0 in | Wt 207.9 lb

## 2023-01-01 VITALS — BP 118/64 | HR 85 | Resp 16

## 2023-01-01 DIAGNOSIS — Z79899 Other long term (current) drug therapy: Secondary | ICD-10-CM | POA: Insufficient documentation

## 2023-01-01 DIAGNOSIS — C3491 Malignant neoplasm of unspecified part of right bronchus or lung: Secondary | ICD-10-CM

## 2023-01-01 DIAGNOSIS — Z5111 Encounter for antineoplastic chemotherapy: Secondary | ICD-10-CM | POA: Insufficient documentation

## 2023-01-01 DIAGNOSIS — J90 Pleural effusion, not elsewhere classified: Secondary | ICD-10-CM | POA: Insufficient documentation

## 2023-01-01 DIAGNOSIS — Z5112 Encounter for antineoplastic immunotherapy: Secondary | ICD-10-CM | POA: Insufficient documentation

## 2023-01-01 DIAGNOSIS — G893 Neoplasm related pain (acute) (chronic): Secondary | ICD-10-CM | POA: Insufficient documentation

## 2023-01-01 DIAGNOSIS — F419 Anxiety disorder, unspecified: Secondary | ICD-10-CM | POA: Insufficient documentation

## 2023-01-01 DIAGNOSIS — C801 Malignant (primary) neoplasm, unspecified: Secondary | ICD-10-CM

## 2023-01-01 DIAGNOSIS — R000 Tachycardia, unspecified: Secondary | ICD-10-CM | POA: Insufficient documentation

## 2023-01-01 DIAGNOSIS — Z923 Personal history of irradiation: Secondary | ICD-10-CM | POA: Insufficient documentation

## 2023-01-01 DIAGNOSIS — C782 Secondary malignant neoplasm of pleura: Secondary | ICD-10-CM | POA: Insufficient documentation

## 2023-01-01 DIAGNOSIS — C3411 Malignant neoplasm of upper lobe, right bronchus or lung: Secondary | ICD-10-CM | POA: Insufficient documentation

## 2023-01-01 DIAGNOSIS — Z9221 Personal history of antineoplastic chemotherapy: Secondary | ICD-10-CM | POA: Insufficient documentation

## 2023-01-01 DIAGNOSIS — Z87891 Personal history of nicotine dependence: Secondary | ICD-10-CM | POA: Insufficient documentation

## 2023-01-01 DIAGNOSIS — Z7982 Long term (current) use of aspirin: Secondary | ICD-10-CM | POA: Insufficient documentation

## 2023-01-01 DIAGNOSIS — R634 Abnormal weight loss: Secondary | ICD-10-CM

## 2023-01-01 DIAGNOSIS — F411 Generalized anxiety disorder: Secondary | ICD-10-CM | POA: Diagnosis not present

## 2023-01-01 DIAGNOSIS — Z510 Encounter for antineoplastic radiation therapy: Secondary | ICD-10-CM | POA: Diagnosis not present

## 2023-01-01 DIAGNOSIS — I4892 Unspecified atrial flutter: Secondary | ICD-10-CM | POA: Insufficient documentation

## 2023-01-01 LAB — CMP (CANCER CENTER ONLY)
ALT: 11 U/L (ref 0–44)
AST: 13 U/L — ABNORMAL LOW (ref 15–41)
Albumin: 3.3 g/dL — ABNORMAL LOW (ref 3.5–5.0)
Alkaline Phosphatase: 59 U/L (ref 38–126)
Anion gap: 8 (ref 5–15)
BUN: 13 mg/dL (ref 8–23)
CO2: 30 mmol/L (ref 22–32)
Calcium: 9 mg/dL (ref 8.9–10.3)
Chloride: 99 mmol/L (ref 98–111)
Creatinine: 0.92 mg/dL (ref 0.61–1.24)
GFR, Estimated: >60 mL/min (ref >60)
Glucose, Bld: 135 mg/dL — ABNORMAL HIGH (ref 70–99)
Potassium: 4.7 mmol/L (ref 3.5–5.1)
Sodium: 137 mmol/L (ref 135–145)
Total Bilirubin: 0.6 mg/dL (ref 0.3–1.2)
Total Protein: 6.9 g/dL (ref 6.5–8.1)

## 2023-01-01 LAB — CBC WITH DIFFERENTIAL (CANCER CENTER ONLY)
Abs Immature Granulocytes: 0.04 10*3/uL (ref 0.00–0.07)
Basophils Absolute: 0 10*3/uL (ref 0.0–0.1)
Basophils Relative: 0 %
Eosinophils Absolute: 0.2 10*3/uL (ref 0.0–0.5)
Eosinophils Relative: 2 %
HCT: 43.8 % (ref 39.0–52.0)
Hemoglobin: 14.5 g/dL (ref 13.0–17.0)
Immature Granulocytes: 0 %
Lymphocytes Relative: 5 %
Lymphs Abs: 0.5 10*3/uL — ABNORMAL LOW (ref 0.7–4.0)
MCH: 28.2 pg (ref 26.0–34.0)
MCHC: 33.1 g/dL (ref 30.0–36.0)
MCV: 85 fL (ref 80.0–100.0)
Monocytes Absolute: 0.8 10*3/uL (ref 0.1–1.0)
Monocytes Relative: 8 %
Neutro Abs: 9.5 10*3/uL — ABNORMAL HIGH (ref 1.7–7.7)
Neutrophils Relative %: 85 %
Platelet Count: 308 10*3/uL (ref 150–400)
RBC: 5.15 MIL/uL (ref 4.22–5.81)
RDW: 12.7 % (ref 11.5–15.5)
WBC Count: 11.1 10*3/uL — ABNORMAL HIGH (ref 4.0–10.5)
nRBC: 0 % (ref 0.0–0.2)

## 2023-01-01 LAB — RAD ONC ARIA SESSION SUMMARY
Course Elapsed Days: 7
Plan Fractions Treated to Date: 6
Plan Prescribed Dose Per Fraction: 2 Gy
Plan Total Fractions Prescribed: 20
Plan Total Prescribed Dose: 40 Gy
Reference Point Dosage Given to Date: 12 Gy
Reference Point Session Dosage Given: 2 Gy
Session Number: 6

## 2023-01-01 LAB — PROTEIN, URINE, RANDOM: Total Protein, Urine: 7 mg/dL

## 2023-01-01 MED ORDER — SODIUM CHLORIDE 0.9 % IV SOLN
150.0000 mg/m2 | Freq: Once | INTRAVENOUS | Status: AC
  Administered 2023-01-01: 330 mg via INTRAVENOUS
  Filled 2023-01-01: qty 55

## 2023-01-01 MED ORDER — PALONOSETRON HCL INJECTION 0.25 MG/5ML
0.2500 mg | Freq: Once | INTRAVENOUS | Status: AC
  Administered 2023-01-01: 0.25 mg via INTRAVENOUS
  Filled 2023-01-01: qty 5

## 2023-01-01 MED ORDER — SODIUM CHLORIDE 0.9 % IV SOLN
10.0000 mg | Freq: Once | INTRAVENOUS | Status: AC
  Administered 2023-01-01: 10 mg via INTRAVENOUS
  Filled 2023-01-01: qty 10

## 2023-01-01 MED ORDER — SODIUM CHLORIDE 0.9 % IV SOLN
Freq: Once | INTRAVENOUS | Status: AC
  Filled 2023-01-01: qty 250

## 2023-01-01 MED ORDER — DIPHENHYDRAMINE HCL 50 MG/ML IJ SOLN
50.0000 mg | Freq: Once | INTRAMUSCULAR | Status: AC
  Administered 2023-01-01: 50 mg via INTRAVENOUS
  Filled 2023-01-01: qty 1

## 2023-01-01 MED ORDER — FAMOTIDINE IN NACL 20-0.9 MG/50ML-% IV SOLN
20.0000 mg | Freq: Once | INTRAVENOUS | Status: AC
  Administered 2023-01-01: 20 mg via INTRAVENOUS
  Filled 2023-01-01: qty 50

## 2023-01-01 MED ORDER — SODIUM CHLORIDE 0.9 % IV SOLN
15.0000 mg/kg | Freq: Once | INTRAVENOUS | Status: AC
  Administered 2023-01-01: 1400 mg via INTRAVENOUS
  Filled 2023-01-01: qty 8

## 2023-01-01 MED ORDER — SODIUM CHLORIDE 0.9 % IV SOLN
585.0000 mg | Freq: Once | INTRAVENOUS | Status: AC
  Administered 2023-01-01: 590 mg via INTRAVENOUS
  Filled 2023-01-01: qty 59

## 2023-01-01 MED ORDER — SODIUM CHLORIDE 0.9 % IV SOLN
150.0000 mg | Freq: Once | INTRAVENOUS | Status: AC
  Administered 2023-01-01: 150 mg via INTRAVENOUS
  Filled 2023-01-01: qty 150

## 2023-01-01 NOTE — Progress Notes (Signed)
C/o cough late in the evening and at bedtime. Daughter states he coughs too much at times can't talk.

## 2023-01-01 NOTE — Patient Instructions (Signed)
[facility] [place] [facility]  Discharge Instructions: Thank you for choosing [facility] to provide your oncology and hematology care.  If you have a lab appointment with the Cancer Center, please go directly to the Cancer Center and check in at the registration area.  Wear comfortable clothing and clothing appropriate for easy access to any Portacath or PICC line.   We strive to give you quality time with your provider. You may need to reschedule your appointment if you arrive late (15 or more minutes).  Arriving late affects you and other patients whose appointments are after yours.  Also, if you miss three or more appointments without notifying the office, you may be dismissed from the clinic at the provider's discretion.      For prescription refill requests, have your pharmacy contact our office and allow 72 hours for refills to be completed.     To help prevent nausea and vomiting after your treatment, we encourage you to take your nausea medication as directed.  BELOW ARE SYMPTOMS THAT SHOULD BE REPORTED IMMEDIATELY: *FEVER GREATER THAN 100.4 F (38 C) OR HIGHER *CHILLS OR SWEATING *NAUSEA AND VOMITING THAT IS NOT CONTROLLED WITH YOUR NAUSEA MEDICATION *UNUSUAL SHORTNESS OF BREATH *UNUSUAL BRUISING OR BLEEDING *URINARY PROBLEMS (pain or burning when urinating, or frequent urination) *BOWEL PROBLEMS (unusual diarrhea, constipation, pain near the anus) TENDERNESS IN MOUTH AND THROAT WITH OR WITHOUT PRESENCE OF ULCERS (sore throat, sores in mouth, or a toothache) UNUSUAL RASH, SWELLING OR PAIN  UNUSUAL VAGINAL DISCHARGE OR ITCHING   Items with * indicate a potential emergency and should be followed up as soon as possible or go to the Emergency Department if any problems should occur.  Please show the CHEMOTHERAPY ALERT CARD or IMMUNOTHERAPY ALERT CARD at check-in to the Emergency Department and triage nurse.  Should you have questions after your visit  or need to cancel or reschedule your appointment, please contact [facility] [place] [facility]  [phone] and follow the prompts.  Office hours are 8:00 a.m. to 4:30 p.m. Monday - Friday. Please note that voicemails left after 4:00 p.m. may not be returned until the following business day.  We are closed weekends and major holidays. You have access to a nurse at all times for urgent questions. Please call the main number to the clinic [phone] and follow the prompts.  For any non-urgent questions, you may also contact your provider using MyChart. We now offer e-Visits for anyone 18 and older to request care online for non-urgent symptoms. For details visit [email or web address].   Also download the MyChart app! Go to the app store, search "MyChart", open the app, select [facility], and log in with your MyChart username and password.    

## 2023-01-01 NOTE — Assessment & Plan Note (Signed)
Treatment plan as listed above. 

## 2023-01-01 NOTE — Assessment & Plan Note (Signed)
Recommend low-dose lorazepam 0.5 mg every 12 hours as needed for anxiety 

## 2023-01-01 NOTE — Progress Notes (Signed)
Hematology/Oncology Progress note [phone] [phone]       Patient Care Team: [name], NP as PCP - General (Family Medicine) [name], RN as Registered Nurse [name], MD as Consulting Physician (Oncology) [name], MD as Referring Physician (Radiation Oncology) Wyn Quaker Marlow Baars, MD as Referring Physician (Vascular Surgery)   REASON FOR VISIT Follow up for immunotherapy treatment for stage IIIA right lung adenocarcinoma, and presumed stage I left lung cancer   ASSESSMENT & PLAN:   Cancer Staging  Primary lung adenocarcinoma, right [facility]) Staging form: Lung, AJCC 8th Edition - Clinical stage from 12/13/2017: Stage IIIA (cT3, cN1, cM0) - Signed by [name], MD on 12/13/2017 - Pathologic stage from 12/24/2022: Stage IV (pTX, pNX, pM1) - Signed by [name], MD on 12/24/2022   Primary lung adenocarcinoma, right (HCC) #History of stage IIIA cT3 N1 M0 lung adenocarcinoma, s/p concurrent chemoradiation [Aug 2019].  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  Brain MRI is negative for metastasis. Thoracentesis cytology and biopsy via bronchoscopy both confirm adenocarcinoma Recurrent stage IV lung adenocarcinoma. Recommend systemic chemotherapy plus immunotherapy. [facility] NGS/PD-L1 are pending. recommend systemic chemotherapy with carboplatin/Taxol/bevacizumab.   Will add immunotherapy after confirming no actionable mutations on NGS.   If PD-L1 is above 50%, in the future, he may be maintained on monotherapy immunotherapy.  Labs are reviewed and discussed with patient. Proceed with carboplatin/Taxol/bevacizumab Rationale and side effects were reviewed and discussed with patient and daughter.  They agree with the plan.  Follow up with Dr. Rushie Chestnut  for palliative radiation for occluded right upper lobe.   Anxiety  associated with cancer diagnosis (HCC) Recommend low-dose lorazepam 0.5 mg every 12 hours as needed for anxiety  Pleural effusion Palliative therapeutic thoracentesis as needed.  Expect to improve with chemotherapy treatment.  Consider pleural catheter if refractory to treatment.    Encounter for antineoplastic chemotherapy Treatment plan as listed above.   Weight loss Refer to nutritionist   Orders Placed This Encounter  Procedures   CBC with Differential (Cancer Center Only)    Standing Status:   Future    Standing Expiration Date:   01/01/2024   CBC with Differential (Cancer Center Only)    Standing Status:   Future    Standing Expiration Date:   01/01/2024   Ambulatory Referral to [facility] Nutrition    Referral Priority:   Routine    Referral Type:   Consultation    Referral Reason:   Specialty Services Required    Number of Visits Requested:   1   Follow up  1 week lab MD  All questions were answered. The patient knows to call the clinic with any problems, questions or concerns.  [name], MD, PhD [facility] Hematology Oncology 01/01/2023      Oncology History:  Mark Mccarty is a  72 y.o.  male with presents for stage IIIA right lung adenocarcinoma, and presumed stage I left lung cancer now with stage IV recurrent lung adenocarcinoma. Former 43 pack year smoking history.   Oncology History  Primary lung adenocarcinoma, right (HCC)  12/13/2017 Initial Diagnosis   cT3 N1 M0 right upper lobe lung adenocarcinoma  August 2019 Finished  concurrent chemoradiation.   03/17/2019 Finished 1 year of durvalumab treatment.   12/13/2017 Cancer Staging   Staging form: Lung, AJCC 8th Edition - Clinical stage from 12/13/2017:  Stage IIIA (cT3, cN1, cM0) - Signed by [name], MD on 12/13/2017   12/31/2017 - 02/19/2018 Chemotherapy   Concurrent chemotherapy carboplatin and Taxol. With radiation   03/24/2018 - 03/17/2019 Chemotherapy   LUNG DURVALUMAB Q14D x 1 year     12/01/2020  Imaging   PET  1. The 1.1 cm in long axis left apical nodule has maximum SUV of 2.2. Given the progression in size over the last year, the appearance is suspicious for small/low-grade adenocarcinoma.  2. Upper normal sized AP window lymph nodes have maximum SUV just above blood pool, and are nonspecific.   Tumor board discussion 12/29/20  Slow-growing left upper lobe nodule.  CT-guided biopsy and biopsy via bronchoscopy are both of high risk due to underlying emphysema.  Consensus reached upon continue monitoring and repeat CT in 3 months.  If nodule continues to grow, he may benefit from empiric SBRT     03/13/2021 Imaging   CT without contrast showed slightly increase of left upper lobe nodule 12x79mm versus 11 x 8 mm.  Slightly increasing size.  Remains suspicious for bronchogenic neoplasm. Subtle nodularity along the posterior left upper lobe approximately 6 mm is unchanged.  Posttreatment changes distort the right hilum are unchanged.   04/20/2021 - 04/25/2021 Radiation Therapy   Patient received radiation to left upper lobe  nodule for presumed Stage I left upper lobe lung cancer.    07/18/2021 Imaging    CT chest without contrast showed stable posttreatment changes within the right lung.  No findings to suggest recurrent tumor or metastatic disease.  Interval decrease in size of previously characterized irregular nodule in the left upper lobe.    10/18/2021 Imaging    CT chest showed a stable examination of chronic postradiation masslike fibrosis in the right lung. Persistent subsolid nodule in the superior segment of the left lower lobe 1.3 x 1.2 cm.  Nonspecific.  COPD findings, aortic atherosclerosis.  Coronary artery disease.   01/17/2022 Imaging   01/17/2022, CT chest without contrast showed subsolid area in the left lower lobe is similar to prior imaging however with increasing nodularity.  Recommend PET scan for further evaluation Perihilar parenchymal destruction and scarring from  prior radiation in the right hilum extending into the right upper and lower lobe unchanged. Left upper lobe parenchymal scarring likely reflects treatment changes.  Marked pulmonary emphysema in the background.  Aortic atherosclerosis.   02/06/2022 Imaging   PET restaging 1. Focus of increasing nodularity in the subpleural left lower lobe identified as concerning on the recent CT chest shows only minimal FDG uptake on PET imaging today. While this is reassuring and the finding may reflect nfectious/inflammatory etiology, low-grade or well differentiated neoplasm can be poorly FDG avid. Close continued follow-up recommended. 2. Stable perifissural left lower lobe sub solid nodule, also with low level FDG uptake. Continued attention on follow-up recommende    05/17/2022 Imaging   1. Stable perihilar post radiation change and volume loss in the RIGHT hemithorax. 2. Stable mild mediastinal lymphadenopathy. 3. Peripheral nodule in the LEFT lower lobe measures slightly larger but has benign morphology. 4. Stable thickening RIGHT adrenal gland. 5. No new or progressive lung cancer identified   11/16/2022 Imaging   CT chest abdomen pelvis w contrast showed 1. New moderate right pleural effusion with diffuse, nodular,enhancing pleural thickening consistent with pleural metastatic disease. 2. Proximal obstruction of the right upper lobe segmental bronchi with complete atelectasis or consolidation of the right upper lobe, consistent with local malignant recurrence and  postobstructive atelectasis of the upper lobe. 3. Interval increase in circumferential soft tissue thickening about the right hilum. Slight interval increase in size in enlarged AP window lymph nodes. 4. Interval enlargement of a subpleural mass of the dependent superior segment left lower lobe consistent with an enlarging pulmonary metastasis or metachronous primary lung malignancy.5. Emphysema. 6. Coronary artery disease. 7.  Prostatomegaly.    11/20/2022 Procedure   Thoracentesis right side Pathology is positive for adenocarcinoma.  Insufficient material for ancillary medical testing.    12/03/2022 Imaging   PET scan showed 1. Hypermetabolic mass at the RIGHT hilum consistent with bronchogenic carcinoma. 2. Postobstructive collapse of the RIGHT upper lobe. 3. Uniform hypermetabolic pleural thickening in the RIGHT lung consistent with pleural metastasis. 4. Large RIGHT effusion. 5. Hypermetabolic nodule in the superior segment of the LEFT lower lobe consistent with pulmonary metastasis. Synchronous bronchogenic carcinoma versus metastatic lesion. 6. Hypermetabolic bilateral mediastinal adenopathy consistent with nodal metastasis. 7. No evidence of metastatic disease outside the thorax. 8. No skeletal metastasis.      12/17/2022 Procedure   Patient underwent biopsy via bronchoscopy for additional tissue Pathology showed  Right upper lobe lavage-positive for non-small cell carcinoma Right upper lobe ENB assisted biopsy showed adenocarcinoma, acinar and solid patterns. Right upper lobe FNA positive for non-small cell carcinoma.   12/24/2022 Cancer Staging   Staging form: Lung, AJCC 8th Edition - Pathologic stage from 12/24/2022: Stage IV (pTX, pNX, pM1) - Signed by [name], MD on 12/24/2022 Stage prefix: Initial diagnosis   01/01/2023 -  Chemotherapy   Patient is on Treatment Plan : LUNG NSCLC Carboplatin + Paclitaxel + Bevacizumab q21d       Multiple  liver nodules,  07/11/2020 MRI liver with and without contrast showed stable hypervascular lesions in the liver parenchyma without new or progressive findings.  Lesions remain most suggestive of benign etiology.  PET scan did not show evaluation activities.   INTERVAL HISTORY Mark Mccarty is a 72 y.o. male who has above history reviewed by me presents for follow-up of stage III A non-small cell lung cancer. S/p therapeutic thoracentesis, sob  slightly better.  Appetite is fair. Lost 4 pounds.   . Review of Systems  Constitutional:  Positive for fatigue and unexpected weight change. Negative for appetite change, chills and fever.  [name]:   Negative for hearing loss and voice change.   Eyes:  Negative for eye problems and icterus.  Respiratory:  Positive for shortness of breath. Negative for chest tightness and cough.   Cardiovascular:  Negative for chest pain and leg swelling.  Gastrointestinal:  Negative for abdominal distention and abdominal pain.  Endocrine: Negative for hot flashes.  Genitourinary:  Negative for difficulty urinating, dysuria and frequency.   Musculoskeletal:  Negative for arthralgias.  Skin:  Negative for itching and rash.  Neurological:  Negative for headaches, light-headedness and numbness.  [name]:  Negative for adenopathy. Does not bruise/bleed easily.  Psychiatric/Behavioral:  Negative for confusion.      MEDICAL HISTORY:  Past Medical History:  Diagnosis Date   Abnormal nuclear stress test    Anxiety    a.) on BZO (lorazepam) PRN   Asthma    Avascular necrosis of femoral head (HCC)    BPH (benign prostatic hyperplasia)    Cataract    a.) s/p extraction on LEFT   Colon adenomas    COPD (chronic obstructive pulmonary disease) (HCC)    Coronary artery disease 11/11/2014   a.) MPI 11/11/2014: EF 56%, mild inf wall  ischemia; b.) LHC 12/16/2014: 20% mRCA - med mgmt   DDD (degenerative disc disease), lumbar    Diverticulosis    DM type 2 (diabetes mellitus, type 2) (HCC)    Duodenitis    Dysphagia    Dyspnea    ED (erectile dysfunction)    a.) on PDE5i (sildenafil) PRN   Elevated PSA    Former smoker    Gastritis    GERD (gastroesophageal reflux disease)    [name]    Hilar mass    History of hiatal hernia    Hyperlipidemia    Hypertension    Hyperuricemia    Hypokalemia    Left ventricular diastolic dysfunction 10/04/2016   a.) TTE 10/04/2016: EF 50%,  mild MR/TR/PR, mod AR, G1DD; b.) TTE 10/10/2017: EF 45%, triv PR, mild AR/MR/TR, G1DD; c.) TTE 03/30/2020: EF 50%, mild-mod AR, mild MR/TR/PR; d.) TTE 04/23/2022: EF >55%, mild LVH, mild LAE, triv TR, mild PR, mod AR/MR   Liver lesion    Obesity    Pedal edema    Peripheral polyneuropathy    Peyronie's disease 05/2022   Pleural effusion    Primary lung adenocarcinoma, right (HCC)    a.) stage IIIA (cT3, cN1, cM0) --> s/p concurrent chemoradiation 2019 --> carboplatin + pacilitaxel (12/31/2017 - 02/19/2018) followed by 1 year (03/24/2018 - 03/17/2019) adjuvant anti-PDL-1 mAB immunotherapy (durvalumab)   [name] (peptic ulcer disease)    Renal insufficiency    Umbilical hernia    [name]     SURGICAL HISTORY: Past Surgical History:  Procedure Laterality Date   CARDIAC CATHETERIZATION N/A 12/16/2014   Procedure: Left Heart Cath;  Surgeon: Marcina Millard, MD;  Location: [facility] INVASIVE CV LAB;  Service: Cardiovascular;  Laterality: N/A;   CATARACT EXTRACTION Left    COLONOSCOPY     COLONOSCOPY WITH PROPOFOL N/A 01/14/2020   Procedure: COLONOSCOPY WITH PROPOFOL;  Surgeon: Toledo, Boykin Nearing, MD;  Location: [facility] ENDOSCOPY;  Service: Gastroenterology;  Laterality: N/A;   CYST REMOVAL TRUNK     chest and back over time and it was removed   CYSTECTOMY     ENDOBRONCHIAL ULTRASOUND N/A 12/09/2017   Procedure: ENDOBRONCHIAL ULTRASOUND;  Surgeon: Shane Crutch, MD;  Location: [name];  Service: Pulmonary;  Laterality: N/A;   PORTA CATH INSERTION N/A 12/23/2017   Procedure: PORTA CATH INSERTION;  Surgeon: Annice Needy, MD;  Location: [facility] INVASIVE CV LAB;  Service: Cardiovascular;  Laterality: N/A;   PORTA CATH REMOVAL N/A 06/04/2022   Procedure: PORTA CATH REMOVAL;  Surgeon: Annice Needy, MD;  Location: [facility] INV[facility] CV LAB;  Service: Cardiovascular;  Laterality: N/A;   UPPER GI ENDOSCOPY     VIDEO BRONCHOSCOPY WITH ENDOBRONCHIAL ULTRASOUND N/A 12/17/2022   Procedure: VIDEO  BRONCHOSCOPY WITH ENDOBRONCHIAL ULTRASOUND;  Surgeon: Vida Rigger, MD;  Location: [name];  Service: Thoracic;  Laterality: N/A;    SOCIAL HISTORY: Social History   Socioeconomic History   Marital status: Divorced    Spouse name: Not on file   Number of children: Not on file   Years of education: Not on file   Highest education level: Not on file  Occupational History   Not on file  Tobacco Use   Smoking status: Former    Packs/day: 1.50    Years: 35.00    Additional pack years: 0.00    Total pack years: 52.50    Types: Cigarettes    Quit date: 06/22/2015    Years since quitting: 7.5    Passive  exposure: Past   Smokeless tobacco: Never  Vaping Use   Vaping Use: Never used  Substance and Sexual Activity   Alcohol use: Not Currently    Comment: weekends   Drug use: No   Sexual activity: Not Currently  Other Topics Concern   Not on file  Social History Narrative   Lives with son [name].   Social Determinants of Health   Financial Resource Strain: Not on file  Food Insecurity: Not on file  Transportation Needs: Not on file  Physical Activity: Not on file  Stress: Not on file  Social Connections: Not on file  Intimate Partner Violence: Not on file    FAMILY HISTORY: Family History  Problem Relation Age of Onset   Hypertension Mother    Breast cancer Mother    Heart attack Mother    Lung cancer Father    Heart disease Sister    Diabetes Sister    Colon cancer Sister     ALLERGIES:  is allergic to penicillins.  MEDICATIONS:  Current Outpatient Medications  Medication Sig Dispense Refill   acetaminophen (TYLENOL) 500 MG tablet Take 500 mg by mouth as needed.     albuterol (VENTOLIN HFA) 108 (90 Base) MCG/ACT inhaler Inhale 2 puffs into the lungs every 4 (four) hours as needed.     amLODipine (NORVASC) 10 MG tablet Take 10 mg by mouth every morning.     ascorbic acid (VITAMIN C) 1000 MG tablet Take 1,000 mg by mouth daily.     aspirin 81 MG tablet Take  81 mg by mouth daily.     carvedilol (COREG) 25 MG tablet Take 25 mg by mouth 2 (two) times daily with a meal.     cetirizine (ZYRTEC) 10 MG tablet Take 1 tablet by mouth as needed.     cholecalciferol (VITAMIN D3) 25 MCG (1000 UNIT) tablet Take 1,000 Units by mouth daily.     dexamethasone (DECADRON) 4 MG tablet Take 2 tablets (8mg ) by mouth daily starting the day after carboplatin for 3 days. Take with food 30 tablet 1   Fluticasone-Umeclidin-Vilant (TRELEGY ELLIPTA) 200-62.5-25 MCG/ACT AEPB Inhale 1 puff into the lungs every morning.     gabapentin (NEURONTIN) 300 MG capsule Take 300 mg by mouth at bedtime as needed.      ibuprofen (ADVIL,MOTRIN) 600 MG tablet Take 600 mg by mouth every 6 (six) hours as needed.     ipratropium-albuterol (DUONEB) 0.5-2.5 (3) MG/3ML SOLN Take 3 mLs by nebulization every 6 (six) hours as needed.     lidocaine-prilocaine (EMLA) cream Apply to affected area once 30 g 3   lisinopril (PRINIVIL,ZESTRIL) 40 MG tablet Take 40 mg by mouth every morning.     LORazepam (ATIVAN) 0.5 MG tablet Take 1 tablet (0.5 mg total) by mouth every 12 (twelve) hours as needed for anxiety. 30 tablet 0   ondansetron (ZOFRAN) 8 MG tablet Take 1 tablet (8 mg total) by mouth every 8 (eight) hours as needed for nausea or vomiting. Start on the third day after carboplatin. 30 tablet 1   potassium chloride (KLOR-CON) 10 MEQ tablet Take 1 tablet (10 mEq total) by mouth daily. (Patient taking differently: Take 10 mEq by mouth every morning.) 90 tablet 1   pravastatin (PRAVACHOL) 20 MG tablet Take 20 mg by mouth at bedtime.     prochlorperazine (COMPAZINE) 10 MG tablet Take 1 tablet (10 mg total) by mouth every 6 (six) hours as needed for nausea or vomiting. 30 tablet 1  senna (SENOKOT) 8.6 MG TABS tablet Take 2 tablets (17.2 mg total) by mouth daily. (Patient taking differently: Take 2 tablets by mouth as needed.) 120 each 0   sildenafil (VIAGRA) 50 MG tablet Take 1-2 tablets (50-100 mg total) by  mouth daily as needed for erectile dysfunction. 30 tablet 6   spironolactone (ALDACTONE) 25 MG tablet Take by mouth.     tamsulosin (FLOMAX) 0.4 MG CAPS capsule Take 0.4 mg by mouth daily after breakfast.     torsemide (DEMADEX) 20 MG tablet Take 20 mg by mouth every morning.     Vibegron (GEMTESA) 75 MG TABS Take 1 tablet (75 mg total) by mouth daily. (Patient taking differently: Take 1 tablet by mouth every morning.) 30 tablet 0   No current facility-administered medications for this visit.   Facility-Administered Medications Ordered in Other Visits  Medication Dose Route Frequency Provider Last Rate Last Admin   sodium chloride flush (NS) 0.9 % injection 10 mL  10 mL Intravenous PRN [name], MD   10 mL at 03/18/18 0858     PHYSICAL EXAMINATION: ECOG PERFORMANCE STATUS: 0 - Asymptomatic Vitals:   01/01/23 0822  BP: 124/68  Pulse: 82  Temp: (!) 95.9 F (35.5 C)  SpO2: (!) 86%   Filed Weights   01/01/23 0822  Weight: 207 lb 14.4 oz (94.3 kg)    Physical Exam Constitutional:      General: He is not in acute distress. [name]:     Head: Normocephalic and atraumatic.  Eyes:     General: No scleral icterus.    Pupils: Pupils are equal, round, and reactive to light.  Cardiovascular:     Rate and Rhythm: Normal rate and regular rhythm.     Heart sounds: Normal heart sounds.  Pulmonary:     Effort: Pulmonary effort is normal. No respiratory distress.     Comments: Decreased breath sound bilaterally Abdominal:     General: Bowel sounds are normal. There is no distension.     Palpations: Abdomen is soft.  Musculoskeletal:        General: No deformity. Normal range of motion.     Cervical back: Normal range of motion and neck supple.  Lymphadenopathy:     Cervical: No cervical adenopathy.  Skin:    General: Skin is warm and dry.     Findings: No erythema or rash.     Comments: Left supraclavicular fossa fullness, chronic.  Neurological:     Mental Status: He is alert and  oriented to person, place, and time. Mental status is at baseline.     Cranial Nerves: No cranial nerve deficit.      LABORATORY DATA:  I have reviewed the data as listed    Latest Ref Rng & Units 01/01/2023    8:25 AM 12/17/2022    9:36 PM 11/21/2022   10:13 AM  CBC  WBC 4.0 - 10.5 K/uL 11.1  8.3  15.3   Hemoglobin 13.0 - 17.0 g/dL 16.1  09.6  04.5   Hematocrit 39.0 - 52.0 % 43.8  45.0  45.7   Platelets 150 - 400 K/uL 308  359  270       Latest Ref Rng & Units 01/01/2023    8:24 AM 12/17/2022    9:36 PM 12/17/2022    2:56 PM  CMP  Glucose 70 - 99 mg/dL 409   811   BUN 8 - 23 mg/dL 13   11   Creatinine 9.14 - 1.24 mg/dL 7.82  0.81  0.82   Sodium 135 - 145 mmol/L 137   138   Potassium 3.5 - 5.1 mmol/L 4.7   3.6   Chloride 98 - 111 mmol/L 99   101   CO2 22 - 32 mmol/L 30   29   Calcium 8.9 - 10.3 mg/dL 9.0   8.1   Total Protein 6.5 - 8.1 g/dL 6.9   6.4   Total Bilirubin 0.3 - 1.2 mg/dL 0.6   0.6   Alkaline Phos 38 - 126 U/L 59   58   AST 15 - 41 U/L 13   16   ALT 0 - 44 U/L 11   13      RADIOGRAPHIC STUDIES: I have personally reviewed the radiological images as listed and agreed with the findings in the report.[name] US THORACENTESIS ASP PLEURAL SPACE W/IMG GUIDE  Result Date: 12/28/2022 INDICATION: Right pleural effusion EXAM: ULTRASOUND GUIDED RIGHT THORACENTESIS MEDICATIONS: None. COMPLICATIONS: None immediate. PROCEDURE: An ultrasound guided thoracentesis was thoroughly discussed with the patient and questions answered. The benefits, risks, alternatives and complications were also discussed. The patient understands and wishes to proceed with the procedure. Written consent was obtained. Ultrasound was performed to localize and mark an adequate pocket of fluid in the right chest. The area was then prepped and draped in the normal sterile fashion. 1% Lidocaine was used for local anesthesia. Under ultrasound guidance a 19 gauge, 7-cm, Yueh catheter was introduced. Thoracentesis was  performed. The catheter was removed and a dressing applied. FINDINGS: A total of approximately 2.0 L of thin serosanguineous fluid was removed. IMPRESSION: Successful ultrasound guided right thoracentesis yielding 2.0 L of serosanguineous pleural fluid. Electronically Signed   By: Olive Bass M.D.   On: 12/28/2022 15:30   DG Chest 2 View  Result Date: 12/28/2022 CLINICAL DATA:  Right pleural effusion EXAM: CHEST - 2 VIEW COMPARISON:  Previous studies including the examination of 12/18/2022 FINDINGS: There is a interval increase in size of large right pleural effusion. There is opacification of right mid and right lower lung fields. Homogeneous density in the medial right upper lung field corresponds to her case is seen right upper lobe. There is faint 2.1 cm nodular density in left parahilar region corresponding to the lung nodule in left lower lobe seen in previous CT. Scarring is seen in the left apex. IMPRESSION: There is large right pleural effusion with interval increase. Homogeneous opacity in the medial right upper lung field corresponds to atelectasis seen in previous CT. There is 2.1 cm nodular density in the left mid lung field corresponding to a nodular density in left lower lobe seen in previous CT. Electronically Signed   By: Ernie Avena M.D.   On: 12/28/2022 14:49   DG Chest Port 1 View  Result Date: 12/28/2022 CLINICAL DATA:  Right pleural effusion, status post thoracentesis EXAM: PORTABLE CHEST 1 VIEW COMPARISON:  Previous studies including the examination of 12/25/2022 FINDINGS: Transverse diameter of heart is increased. There is homogeneous opacity in the medial right upper lung field consistent with previously described atelectasis in right upper lobe. There is decreasing large right pleural effusion. There is no pneumothorax. Left lung is clear. Evaluation of right lower lung field is limited by the effusion. IMPRESSION: There is decrease in amount of right pleural effusion  after thoracentesis. There is no pneumothorax. Electronically Signed   By: Ernie Avena M.D.   On: 12/28/2022 14:44   DG Chest Port 1 View  Result Date: 12/18/2022 CLINICAL DATA:  Pleural effusion. EXAM: PORTABLE CHEST 1 VIEW COMPARISON:  12/17/2022 FINDINGS: Stable asymmetric elevation right hemidiaphragm. Interval decrease in right pleural effusion. Bibasilar atelectasis again noted. Cardiopericardial silhouette is at upper limits of normal for size. No acute bony abnormality. Soft tissue fullness in the medial right apex is similar to prior, compatible with right upper lobe collapse. IMPRESSION: 1. Interval decrease in right pleural effusion. 2. Persistent right upper lobe collapse. Electronically Signed   By: Kennith Center M.D.   On: 12/18/2022 12:20   US THORACENTESIS ASP PLEURAL SPACE W/IMG GUIDE  Result Date: 12/17/2022 INDICATION: Patient with right lung cancer with recurrent pleural effusion. Interventional radiology asked to perform a therapeutic thoracentesis. EXAM: ULTRASOUND GUIDED THORACENTESIS MEDICATIONS: 1% lidocaine 10 mL COMPLICATIONS: None immediate. PROCEDURE: An ultrasound guided thoracentesis was thoroughly discussed with the patient and questions answered. The benefits, risks, alternatives and complications were also discussed. The patient understands and wishes to proceed with the procedure. Written consent was obtained. Ultrasound was performed to localize and mark an adequate pocket of fluid in the right chest. The area was then prepped and draped in the normal sterile fashion. 1% Lidocaine was used for local anesthesia. Under ultrasound guidance a 6 Fr Safe-T-Centesis catheter was introduced. Thoracentesis was performed. The catheter was removed and a dressing applied. FINDINGS: A total of approximately 1.5 L of amber colored fluid was removed. IMPRESSION: Successful ultrasound guided right thoracentesis yielding 1.5 L of pleural fluid. Procedure performed by Alwyn Ren NP and supervised by Dr. Archer Asa. Electronically Signed   By: Malachy Moan M.D.   On: 12/17/2022 20:15   CT SUPER D CHEST WO MONARCH PILOT  Result Date: 12/17/2022 CLINICAL DATA:  Non-small cell lung cancer, ongoing immunotherapy, pleural metastatic disease * Tracking Code: BO * EXAM: CT CHEST WITHOUT CONTRAST TECHNIQUE: Multidetector CT imaging of the chest was performed using thin slice collimation for electromagnetic bronchoscopy planning purposes, without intravenous contrast. RADIATION DOSE REDUCTION: This exam was performed according to the departmental dose-optimization program which includes automated exposure control, adjustment of the mA and/or kV according to patient size and/or use of iterative reconstruction technique. COMPARISON:  Multiple exams, including PET-CT 12/03/2022 FINDINGS: Cardiovascular: Coronary, aortic arch, and branch vessel atherosclerotic vascular disease. Trace pericardial effusion. Mediastinum/Nodes: Mediastinal adenopathy was recently hypermetabolic with representative nodes including a 1.1 cm AP window lymph node on image 58 of series 2 and a 1.6 cm subcarinal lymph node on image 72 series 2. Lungs/Pleura: Very large right pleural effusion with only a small amount of aerated right middle lobe and complete atelectasis of the right lower lobe and right upper lobe. Extensive rind like density along the pleural is irregular and compatible with pleural metastatic disease. There is also nodular infiltration of the pleural adipose tissue at the right anterior lung base adjacent to the pericardium compatible with tumor infiltration. The patient has a known right hilar and infrahilar mass which is obscured by the surrounding atelectasis. Severe emphysema. A pleural-based nodule in the left lower lobe superior segment measures 2.2 by 2.4 cm on image 79 of series 4 and was hypermetabolic on the 12/03/2022 PET-CT compatible with a contralateral metastatic lesion. Upper  Abdomen: No adrenal mass. Scattered colonic diverticula. Abdominal aortic atherosclerosis. Musculoskeletal: Probable hemangioma at the L2 vertebral level. Not previously hypermetabolic. IMPRESSION: 1. Very large right pleural effusion with only a small amount of aerated right middle lobe and complete atelectasis of the right lower lobe and right upper lobe. 2. Extensive rind like density along the pleural is  irregular and compatible with pleural metastatic disease. There is also nodular infiltration of the pleural adipose tissue at the right anterior lung base adjacent to the pericardium compatible with tumor infiltration. 3. The patient has a known right hilar and infrahilar mass which is obscured by the surrounding atelectasis. 4. Mediastinal adenopathy was recently hypermetabolic on the 12/03/2022 PET-CT compatible with metastatic disease. 5. A pleural-based nodule in the left lower lobe superior segment measures 2.2 by 2.4 cm and was hypermetabolic on the 12/03/2022 PET-CT compatible with a contralateral metastatic lesion. 6. Severe emphysema. 7. Coronary, aortic arch, and branch vessel atherosclerotic vascular disease. 8. Trace pericardial effusion. 9. Probable hemangioma at the L2 vertebral level. Aortic Atherosclerosis (ICD10-I70.0) and Emphysema (ICD10-J43.9). Electronically Signed   By: Gaylyn Rong M.D.   On: 12/17/2022 18:06   DG Chest Port 1 View  Result Date: 12/17/2022 CLINICAL DATA:  Status post bronchoscopy EXAM: PORTABLE CHEST 1 VIEW COMPARISON:  11/20/2022 FINDINGS: There is only a miniscule amount of aerated lung in the right mid upper chest, otherwise the right hemithorax is completely opacified. Indistinct pulmonary vasculature on the left possibly from pulmonary venous hypertension. No blunting of the left costophrenic angle. No visible pneumothorax. IMPRESSION: 1. Near complete opacification of the right hemithorax, with only a miniscule amount of aerated lung in the right mid upper  chest. 2. Indistinct pulmonary vasculature on the left possibly from pulmonary venous hypertension. Electronically Signed   By: Gaylyn Rong M.D.   On: 12/17/2022 17:56   DG Chest Port 1 View  Result Date: 12/17/2022 CLINICAL DATA:  Status post thoracentesis EXAM: PORTABLE CHEST 1 VIEW COMPARISON:  Chest x-ray dated June 17th 2020 FINDINGS: Visualized cardiac and mediastinal contours are unchanged. Moderate right pleural effusion, decreased in size when compared with the prior exam. Lung predominant heterogeneous opacities, likely atelectasis. Persistent right upper lobe collapse. No evidence of pneumothorax. IMPRESSION: 1. Moderate right pleural effusion, decreased in size. 2. No evidence of pneumothorax. 3. Persistent right upper lobe collapse. Electronically Signed   By: Allegra Lai M.D.   On: 12/17/2022 16:42   MR Brain W Wo Contrast  Result Date: 12/16/2022 CLINICAL DATA:  Lung cancer, evaluate for metastatic disease EXAM: MRI HEAD WITHOUT AND WITH CONTRAST TECHNIQUE: Multiplanar, multiecho pulse sequences of the brain and surrounding structures were obtained without and with intravenous contrast. CONTRAST:  10mL GADAVIST GADOBUTROL 1 MMOL/ML IV SOLN COMPARISON:  08/25/2019 FINDINGS: Brain: No restricted diffusion to suggest acute or subacute infarct. No abnormal parenchymal or meningeal enhancement. No acute hemorrhage, mass, mass effect, or midline shift. No hydrocephalus or extra-axial collection. Partial empty sella. Craniocervical junction. No hemosiderin deposition to suggest remote hemorrhage. Normal cerebral volume for age. Scattered T2 hyperintense signal in the periventricular white matter, likely the sequela of mild chronic small vessel ischemic disease. Vascular: Normal arterial flow voids. Skull and upper cervical spine: Normal marrow signal. Sinuses/Orbits: Clear paranasal sinuses. No acute finding in the orbits. Status post left lens replacement. Other: The mastoid air cells  are well aerated. IMPRESSION: No acute intracranial process. No evidence of metastatic disease in the brain. Electronically Signed   By: Wiliam Ke M.D.   On: 12/16/2022 03:30   NM PET Image Restag (PS) Skull Base To Thigh  Result Date: 12/03/2022 CLINICAL DATA:  Subsequent treatment strategy for non-small cell lung cancer. EXAM: NUCLEAR MEDICINE PET SKULL BASE TO THIGH TECHNIQUE: 12.1 mCi F-18 FDG was injected intravenously. Full-ring PET imaging was performed from the skull base to thigh after the radiotracer.  CT data was obtained and used for attenuation correction and anatomic localization. Fasting blood glucose: 101 mg/dl COMPARISON:  PET-CT 21/30/8657 CT 11/16/2022 FINDINGS: Mediastinal blood pool activity: SUV max 2.1 Liver activity: SUV max NA NECK: No hypermetabolic lymph nodes in the neck. Incidental CT findings: None. CHEST: Rim intense hypermetabolic activity within the RIGHT hemithorax pleural space. This thin rim of metabolic 2 borders in large RIGHT pleural effusion. More nodular pleural thickening in the medial aspect of the RIGHT middle lobe with SUV max equal 8.5. Nodular thickening measuring up to 9 mm (image 70/4). Hypermetabolic thickening at the RIGHT hilum measures 3.5 cm surrounds the RIGHT upper lobe bronchi. Activity is intense with SUV max equal 8.5. There is postobstructive collapse of the RIGHT upper lobe. Large RIGHT effusion. Within the superior segment LEFT lower lobe 22 mm hypermetabolic nodule with SUV max equal 9.5. There is small bilateral hypermetabolic mediastinal adenopathy and subcarinal adenopathy. Incidental CT findings: None. ABDOMEN/PELVIS: Limited view of the liver, kidneys, pancreas are unremarkable. Normal adrenal glands. Incidental CT findings: Adrenal glands normal. No metastatic adenopathy. No liver metastasis. SKELETON: No focal hypermetabolic activity to suggest skeletal metastasis. Several sclerotic lesions in the pelvis and spine without metabolic activity.  Incidental CT findings: None. IMPRESSION: 1. Hypermetabolic mass at the RIGHT hilum consistent with bronchogenic carcinoma. 2. Postobstructive collapse of the RIGHT upper lobe. 3. Uniform hypermetabolic pleural thickening in the RIGHT lung consistent with pleural metastasis. 4. Large RIGHT effusion. 5. Hypermetabolic nodule in the superior segment of the LEFT lower lobe consistent with pulmonary metastasis. Synchronous bronchogenic carcinoma versus metastatic lesion. 6. Hypermetabolic bilateral mediastinal adenopathy consistent with nodal metastasis. 7. No evidence of metastatic disease outside the thorax. 8. No skeletal metastasis. Electronically Signed   By: Genevive Bi M.D.   On: 12/03/2022 16:33   US THORACENTESIS ASP PLEURAL SPACE W/IMG GUIDE  Result Date: 11/20/2022 INDICATION: Right pleural effusion EXAM: ULTRASOUND GUIDED RIGHT THORACENTESIS MEDICATIONS: 7 cc 1% lidocaine COMPLICATIONS: None immediate. PROCEDURE: An ultrasound guided thoracentesis was thoroughly discussed with the patient and questions answered. The benefits, risks, alternatives and complications were also discussed. The patient understands and wishes to proceed with the procedure. Written consent was obtained. Ultrasound was performed to localize and mark an adequate pocket of fluid in the right chest. The area was then prepped and draped in the normal sterile fashion. 1% Lidocaine was used for local anesthesia. Under ultrasound guidance a catheter was introduced. Thoracentesis was performed. The catheter was removed and a dressing applied. FINDINGS: A total of approximately 1 L of hazy yellow fluid was removed. Samples were sent to the laboratory as requested by the clinical team. IMPRESSION: Successful ultrasound guided right thoracentesis yielding 1 L of pleural fluid. Follow-up chest x-ray revealed no evidence of pneumothorax. Procedure performed by Mina Marble, PA-C Electronically Signed   By: Olive Bass M.D.   On:  11/20/2022 15:47   DG Chest Port 1 View  Result Date: 11/20/2022 CLINICAL DATA:  s/p thoracentesis, right EXAM: PORTABLE CHEST 1 VIEW COMPARISON:  Multiple prior CTs FINDINGS: There is right upper lobe collapse. The heart appears within normal limits. No significant pleural effusion appreciated status post right thoracentesis. No pneumothorax. The left lung is clear. No acute osseous abnormality. IMPRESSION: 1. No pneumothorax status post right thoracentesis. No significant residual pleural effusion. 2. Known right upper lobe collapse. Electronically Signed   By: Olive Bass M.D.   On: 11/20/2022 14:57   CT CHEST ABDOMEN PELVIS W CONTRAST  Result Date: 11/16/2022 CLINICAL  DATA:  Lung cancer restaging, ongoing immunotherapy * Tracking Code: BO * EXAM: CT CHEST, ABDOMEN, AND PELVIS WITH CONTRAST TECHNIQUE: Multidetector CT imaging of the chest, abdomen and pelvis was performed following the standard protocol during bolus administration of intravenous contrast. RADIATION DOSE REDUCTION: This exam was performed according to the departmental dose-optimization program which includes automated exposure control, adjustment of the mA and/or kV according to patient size and/or use of iterative reconstruction technique. CONTRAST:  OMNIPAQUE IOHEXOL 300 MG/ML  SOLN COMPARISON:  CT chest, 05/16/2022, PET-CT, 02/05/2022 FINDINGS: CT CHEST FINDINGS Cardiovascular: Aortic atherosclerosis. Normal heart size. Scattered left coronary artery calcifications. No pericardial effusion. Mediastinum/Nodes: Interval increase in circumferential soft tissue thickening about the right hilum (series 2, image 24). Slight interval increase in size in enlarged AP window lymph nodes, measuring up to 2.3 x 1.1 cm, previously 1.7 x 0.9 cm (series 2, image 42). Thyroid gland, trachea, and esophagus demonstrate no significant findings. Lungs/Pleura: New moderate right pleural effusion with diffuse, nodular, enhancing pleural thickening  (series 2, image 29). Proximal obstruction of the right upper lobe segmental bronchi with complete atelectasis or consolidation of the right upper lobe, new compared to prior examination (series 2, image 19). Interval enlargement of a subpleural mass of the dependent superior segment left lower lobe, measuring 2.4 x 2.1 cm, previously 1.8 x 1.2 cm (series 3, image 68). Severe underlying emphysema. Musculoskeletal: No chest wall abnormality. No acute osseous findings. CT ABDOMEN PELVIS FINDINGS Hepatobiliary: No solid liver abnormality is seen. No gallstones, gallbladder wall thickening, or biliary dilatation. Pancreas: Unremarkable. No pancreatic ductal dilatation or surrounding inflammatory changes. Spleen: Normal in size without significant abnormality. Adrenals/Urinary Tract: Adrenal glands are unremarkable. Kidneys are normal, without renal calculi, solid lesion, or hydronephrosis. Bladder is unremarkable. Stomach/Bowel: Stomach is within normal limits. Appendix appears normal. No evidence of bowel wall thickening, distention, or inflammatory changes. Pancolonic diverticulosis. Moderate burden of stool throughout the colon. Vascular/Lymphatic: Aortic atherosclerosis. No enlarged abdominal or pelvic lymph nodes. Reproductive: Prostatomegaly. Other: No abdominal wall hernia or abnormality. No ascites. Musculoskeletal: No acute osseous findings. Unchanged sclerotic, trabeculated vertebral body hemangiomata of T10 and L2 (series 6, image 100). No suspicious osseous lesions. 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 IMPRESSION: 1. New moderate right pleural effusion with diffuse, nodular, enhancing pleural thickening consistent with pleural metastatic disease. 2. Proximal obstruction of the right upper lobe segmental bronchi with complete atelectasis or consolidation of the right upper lobe, consistent with local malignant recurrence and postobstructive atelectasis of the upper lobe. 3.  Interval increase in circumferential soft tissue thickening about the right hilum. Slight interval increase in size in enlarged AP window lymph nodes. 4. Interval enlargement of a subpleural mass of the dependent superior segment left lower lobe consistent with an enlarging pulmonary metastasis or metachronous primary lung malignancy. 5. Emphysema. 6. Coronary artery disease. 7. Prostatomegaly. These results will be called to the ordering clinician or representative by the Radiologist Assistant, and communication documented in the PACS or [employer]. Aortic Atherosclerosis (ICD10-I70.0) and Emphysema (ICD10-J43.9). Electronically Signed   By: Jearld Lesch M.D.   On: 11/16/2022 17:13

## 2023-01-01 NOTE — H&P (View-Only) (Signed)
Hematology/Oncology Progress note [phone] [phone]       Patient Care Team: Coward, Shannon H, NP as PCP - General (Family Medicine) Rhode, Hayley, RN as Registered Nurse [name], MD as Consulting Physician (Oncology) Chrystal, Glenn, MD as Referring Physician (Radiation Oncology) Dew, Jason S, MD as Referring Physician (Vascular Surgery)   REASON FOR VISIT Follow up for immunotherapy treatment for stage IIIA right lung adenocarcinoma, and presumed stage I left lung cancer   ASSESSMENT & PLAN:   Cancer Staging  Primary lung adenocarcinoma, right (HCC) Staging form: Lung, AJCC 8th Edition - Clinical stage from 12/13/2017: Stage IIIA (cT3, cN1, cM0) - Signed by Lakeesha Fontanilla, MD on 12/13/2017 - Pathologic stage from 12/24/2022: Stage IV (pTX, pNX, pM1) - Signed by Ansh Fauble, MD on 12/24/2022   Primary lung adenocarcinoma, right (HCC) #History of stage IIIA cT3 N1 M0 lung adenocarcinoma, s/p concurrent chemoradiation [Aug 2019].  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  Brain MRI is negative for metastasis. Thoracentesis cytology and biopsy via bronchoscopy both confirm adenocarcinoma Recurrent stage IV lung adenocarcinoma. Recommend systemic chemotherapy plus immunotherapy. [facility] NGS/PD-L1 are pending. recommend systemic chemotherapy with carboplatin/Taxol/bevacizumab.   Will add immunotherapy after confirming no actionable mutations on NGS.   If PD-L1 is above 50%, in the future, he may be maintained on monotherapy immunotherapy.  Labs are reviewed and discussed with patient. Proceed with carboplatin/Taxol/bevacizumab Rationale and side effects were reviewed and discussed with patient and daughter.  They agree with the plan.  Follow up with Dr. Chrystal  for palliative radiation for occluded right upper lobe.   Anxiety  associated with cancer diagnosis (HCC) Recommend low-dose lorazepam 0.5 mg every 12 hours as needed for anxiety  Pleural effusion Palliative therapeutic thoracentesis as needed.  Expect to improve with chemotherapy treatment.  Consider pleural catheter if refractory to treatment.    Encounter for antineoplastic chemotherapy Treatment plan as listed above.   Weight loss Refer to nutritionist   Orders Placed This Encounter  Procedures   CBC with Differential (Cancer Center Only)    Standing Status:   Future    Standing Expiration Date:   01/01/2024   CBC with Differential (Cancer Center Only)    Standing Status:   Future    Standing Expiration Date:   01/01/2024   Ambulatory Referral to [facility]    Referral Priority:   Routine    Referral Type:   Consultation    Referral Reason:   Specialty Services Required    Number of Visits Requested:   1   Follow up  1 week lab MD  All questions were answered. The patient knows to call the clinic with any problems, questions or concerns.  [name], MD, PhD [facility] 01/01/2023      Oncology History:  Mark Mccarty is a  72 y.o.  male with presents for stage IIIA right lung adenocarcinoma, and presumed stage I left lung cancer now with stage IV recurrent lung adenocarcinoma. Former 43 pack year smoking history.   Oncology History  Primary lung adenocarcinoma, right (HCC)  12/13/2017 Initial Diagnosis   cT3 N1 M0 right upper lobe lung adenocarcinoma  August 2019 Finished  concurrent chemoradiation.   03/17/2019 Finished 1 year of durvalumab treatment.   12/13/2017 Cancer Staging   Staging form: Lung, AJCC 8th Edition - Clinical stage from 12/13/2017:   Stage IIIA (cT3, cN1, cM0) - Signed by Gracen Ringwald, MD on 12/13/2017   12/31/2017 - 02/19/2018 Chemotherapy   Concurrent chemotherapy carboplatin and Taxol. With radiation   03/24/2018 - 03/17/2019 Chemotherapy   LUNG DURVALUMAB Q14D x 1 year     12/01/2020  Imaging   PET  1. The 1.1 cm in long axis left apical nodule has maximum SUV of 2.2. Given the progression in size over the last year, the appearance is suspicious for small/low-grade adenocarcinoma.  2. Upper normal sized AP window lymph nodes have maximum SUV just above blood pool, and are nonspecific.   Tumor board discussion 12/29/20  Slow-growing left upper lobe nodule.  CT-guided biopsy and biopsy via bronchoscopy are both of high risk due to underlying emphysema.  Consensus reached upon continue monitoring and repeat CT in 3 months.  If nodule continues to grow, he may benefit from empiric SBRT     03/13/2021 Imaging   CT without contrast showed slightly increase of left upper lobe nodule 12x9mm versus 11 x 8 mm.  Slightly increasing size.  Remains suspicious for bronchogenic neoplasm. Subtle nodularity along the posterior left upper lobe approximately 6 mm is unchanged.  Posttreatment changes distort the right hilum are unchanged.   04/20/2021 - 04/25/2021 Radiation Therapy   Patient received radiation to left upper lobe  nodule for presumed Stage I left upper lobe lung cancer.    07/18/2021 Imaging    CT chest without contrast showed stable posttreatment changes within the right lung.  No findings to suggest recurrent tumor or metastatic disease.  Interval decrease in size of previously characterized irregular nodule in the left upper lobe.    10/18/2021 Imaging    CT chest showed a stable examination of chronic postradiation masslike fibrosis in the right lung. Persistent subsolid nodule in the superior segment of the left lower lobe 1.3 x 1.2 cm.  Nonspecific.  COPD findings, aortic atherosclerosis.  Coronary artery disease.   01/17/2022 Imaging   01/17/2022, CT chest without contrast showed subsolid area in the left lower lobe is similar to prior imaging however with increasing nodularity.  Recommend PET scan for further evaluation Perihilar parenchymal destruction and scarring from  prior radiation in the right hilum extending into the right upper and lower lobe unchanged. Left upper lobe parenchymal scarring likely reflects treatment changes.  Marked pulmonary emphysema in the background.  Aortic atherosclerosis.   02/06/2022 Imaging   PET restaging 1. Focus of increasing nodularity in the subpleural left lower lobe identified as concerning on the recent CT chest shows only minimal FDG uptake on PET imaging today. While this is reassuring and the finding may reflect nfectious/inflammatory etiology, low-grade or well differentiated neoplasm can be poorly FDG avid. Close continued follow-up recommended. 2. Stable perifissural left lower lobe sub solid nodule, also with low level FDG uptake. Continued attention on follow-up recommende    05/17/2022 Imaging   1. Stable perihilar post radiation change and volume loss in the RIGHT hemithorax. 2. Stable mild mediastinal lymphadenopathy. 3. Peripheral nodule in the LEFT lower lobe measures slightly larger but has benign morphology. 4. Stable thickening RIGHT adrenal gland. 5. No new or progressive lung cancer identified   11/16/2022 Imaging   CT chest abdomen pelvis w contrast showed 1. New moderate right pleural effusion with diffuse, nodular,enhancing pleural thickening consistent with pleural metastatic disease. 2. Proximal obstruction of the right upper lobe segmental bronchi with complete atelectasis or consolidation of the right upper lobe, consistent with local malignant recurrence and   postobstructive atelectasis of the upper lobe. 3. Interval increase in circumferential soft tissue thickening about the right hilum. Slight interval increase in size in enlarged AP window lymph nodes. 4. Interval enlargement of a subpleural mass of the dependent superior segment left lower lobe consistent with an enlarging pulmonary metastasis or metachronous primary lung malignancy.5. Emphysema. 6. Coronary artery disease. 7.  Prostatomegaly.    11/20/2022 Procedure   Thoracentesis right side Pathology is positive for adenocarcinoma.  Insufficient material for ancillary medical testing.    12/03/2022 Imaging   PET scan showed 1. Hypermetabolic mass at the RIGHT hilum consistent with bronchogenic carcinoma. 2. Postobstructive collapse of the RIGHT upper lobe. 3. Uniform hypermetabolic pleural thickening in the RIGHT lung consistent with pleural metastasis. 4. Large RIGHT effusion. 5. Hypermetabolic nodule in the superior segment of the LEFT lower lobe consistent with pulmonary metastasis. Synchronous bronchogenic carcinoma versus metastatic lesion. 6. Hypermetabolic bilateral mediastinal adenopathy consistent with nodal metastasis. 7. No evidence of metastatic disease outside the thorax. 8. No skeletal metastasis.      12/17/2022 Procedure   Patient underwent biopsy via bronchoscopy for additional tissue Pathology showed  Right upper lobe lavage-positive for non-small cell carcinoma Right upper lobe ENB assisted biopsy showed adenocarcinoma, acinar and solid patterns. Right upper lobe FNA positive for non-small cell carcinoma.   12/24/2022 Cancer Staging   Staging form: Lung, AJCC 8th Edition - Pathologic stage from 12/24/2022: Stage IV (pTX, pNX, pM1) - Signed by Jada Kuhnert, MD on 12/24/2022 Stage prefix: Initial diagnosis   01/01/2023 -  Chemotherapy   Patient is on Treatment Plan : LUNG NSCLC Carboplatin + Paclitaxel + Bevacizumab q21d       Multiple  liver nodules,  07/11/2020 MRI liver with and without contrast showed stable hypervascular lesions in the liver parenchyma without new or progressive findings.  Lesions remain most suggestive of benign etiology.  PET scan did not show evaluation activities.   INTERVAL HISTORY Mark Mccarty is a 72 y.o. male who has above history reviewed by me presents for follow-up of stage III A non-small cell lung cancer. S/p therapeutic thoracentesis, sob  slightly better.  Appetite is fair. Lost 4 pounds.   . Review of Systems  Constitutional:  Positive for fatigue and unexpected weight change. Negative for appetite change, chills and fever.  [name]:   Negative for hearing loss and voice change.   Eyes:  Negative for eye problems and icterus.  Respiratory:  Positive for shortness of breath. Negative for chest tightness and cough.   Cardiovascular:  Negative for chest pain and leg swelling.  Gastrointestinal:  Negative for abdominal distention and abdominal pain.  Endocrine: Negative for hot flashes.  Genitourinary:  Negative for difficulty urinating, dysuria and frequency.   Musculoskeletal:  Negative for arthralgias.  Skin:  Negative for itching and rash.  Neurological:  Negative for headaches, light-headedness and numbness.  [name]:  Negative for adenopathy. Does not bruise/bleed easily.  Psychiatric/Behavioral:  Negative for confusion.      MEDICAL HISTORY:  Past Medical History:  Diagnosis Date   Abnormal nuclear stress test    Anxiety    a.) on BZO (lorazepam) PRN   Asthma    Avascular necrosis of femoral head (HCC)    BPH (benign prostatic hyperplasia)    Cataract    a.) s/p extraction on LEFT   Colon adenomas    COPD (chronic obstructive pulmonary disease) (HCC)    Coronary artery disease 11/11/2014   a.) MPI 11/11/2014: EF 56%, mild inf wall   ischemia; b.) LHC 12/16/2014: 20% mRCA - med mgmt   DDD (degenerative disc disease), lumbar    Diverticulosis    DM type 2 (diabetes mellitus, type 2) (HCC)    Duodenitis    Dysphagia    Dyspnea    ED (erectile dysfunction)    a.) on PDE5i (sildenafil) PRN   Elevated PSA    Former smoker    Gastritis    GERD (gastroesophageal reflux disease)    [name]    Hilar mass    History of hiatal hernia    Hyperlipidemia    Hypertension    Hyperuricemia    Hypokalemia    Left ventricular diastolic dysfunction 10/04/2016   a.) TTE 10/04/2016: EF 50%,  mild MR/TR/PR, mod AR, G1DD; b.) TTE 10/10/2017: EF 45%, triv PR, mild AR/MR/TR, G1DD; c.) TTE 03/30/2020: EF 50%, mild-mod AR, mild MR/TR/PR; d.) TTE 04/23/2022: EF >55%, mild LVH, mild LAE, triv TR, mild PR, mod AR/MR   Liver lesion    Obesity    Pedal edema    Peripheral polyneuropathy    Peyronie's disease 05/2022   Pleural effusion    Primary lung adenocarcinoma, right (HCC)    a.) stage IIIA (cT3, cN1, cM0) --> s/p concurrent chemoradiation 2019 --> carboplatin + pacilitaxel (12/31/2017 - 02/19/2018) followed by 1 year (03/24/2018 - 03/17/2019) adjuvant anti-PDL-1 mAB immunotherapy (durvalumab)   [name] (peptic ulcer disease)    Renal insufficiency    Umbilical hernia    [name]     SURGICAL HISTORY: Past Surgical History:  Procedure Laterality Date   CARDIAC CATHETERIZATION N/A 12/16/2014   Procedure: Left Heart Cath;  Surgeon: Alexander Paraschos, MD;  Location: [facility];  Service: Cardiovascular;  Laterality: N/A;   CATARACT EXTRACTION Left    COLONOSCOPY     COLONOSCOPY WITH PROPOFOL N/A 01/14/2020   Procedure: COLONOSCOPY WITH PROPOFOL;  Surgeon: Toledo, Teodoro K, MD;  Location: [facility];  Service: Gastroenterology;  Laterality: N/A;   CYST REMOVAL TRUNK     chest and back over time and it was removed   CYSTECTOMY     ENDOBRONCHIAL ULTRASOUND N/A 12/09/2017   Procedure: ENDOBRONCHIAL ULTRASOUND;  Surgeon: Ramachandran, Pradeep, MD;  Location: [name];  Service: Pulmonary;  Laterality: N/A;   PORTA CATH INSERTION N/A 12/23/2017   Procedure: PORTA CATH INSERTION;  Surgeon: Dew, Jason S, MD;  Location: [facility];  Service: Cardiovascular;  Laterality: N/A;   PORTA CATH REMOVAL N/A 06/04/2022   Procedure: PORTA CATH REMOVAL;  Surgeon: Dew, Jason S, MD;  Location: [facility];  Service: Cardiovascular;  Laterality: N/A;   UPPER GI ENDOSCOPY     VIDEO BRONCHOSCOPY WITH ENDOBRONCHIAL ULTRASOUND N/A 12/17/2022   Procedure: VIDEO  BRONCHOSCOPY WITH ENDOBRONCHIAL ULTRASOUND;  Surgeon: Aleskerov, Fuad, MD;  Location: [name];  Service: Thoracic;  Laterality: N/A;    SOCIAL HISTORY: Social History   Socioeconomic History   Marital status: Divorced    Spouse name: Not on file   Number of children: Not on file   Years of education: Not on file   Highest education level: Not on file  Occupational History   Not on file  Tobacco Use   Smoking status: Former    Packs/day: 1.50    Years: 35.00    Additional pack years: 0.00    Total pack years: 52.50    Types: Cigarettes    Quit date: 06/22/2015    Years since quitting: 7.5    Passive   exposure: Past   Smokeless tobacco: Never  Vaping Use   Vaping Use: Never used  Substance and Sexual Activity   Alcohol use: Not Currently    Comment: weekends   Drug use: No   Sexual activity: Not Currently  Other Topics Concern   Not on file  Social History Narrative   Lives with son Joshua.   Social Determinants of Health   Financial Resource Strain: Not on file  Food Insecurity: Not on file  Transportation Needs: Not on file  Physical Activity: Not on file  Stress: Not on file  Social Connections: Not on file  Intimate Partner Violence: Not on file    FAMILY HISTORY: Family History  Problem Relation Age of Onset   Hypertension Mother    Breast cancer Mother    Heart attack Mother    Lung cancer Father    Heart disease Sister    Diabetes Sister    Colon cancer Sister     ALLERGIES:  is allergic to penicillins.  MEDICATIONS:  Current Outpatient Medications  Medication Sig Dispense Refill   acetaminophen (TYLENOL) 500 MG tablet Take 500 mg by mouth as needed.     albuterol (VENTOLIN HFA) 108 (90 Base) MCG/ACT inhaler Inhale 2 puffs into the lungs every 4 (four) hours as needed.     amLODipine (NORVASC) 10 MG tablet Take 10 mg by mouth every morning.     ascorbic acid (VITAMIN C) 1000 MG tablet Take 1,000 mg by mouth daily.     aspirin 81 MG tablet Take  81 mg by mouth daily.     carvedilol (COREG) 25 MG tablet Take 25 mg by mouth 2 (two) times daily with a meal.     cetirizine (ZYRTEC) 10 MG tablet Take 1 tablet by mouth as needed.     cholecalciferol (VITAMIN D3) 25 MCG (1000 UNIT) tablet Take 1,000 Units by mouth daily.     dexamethasone (DECADRON) 4 MG tablet Take 2 tablets (8mg) by mouth daily starting the day after carboplatin for 3 days. Take with food 30 tablet 1   Fluticasone-Umeclidin-Vilant (TRELEGY ELLIPTA) 200-62.5-25 MCG/ACT AEPB Inhale 1 puff into the lungs every morning.     gabapentin (NEURONTIN) 300 MG capsule Take 300 mg by mouth at bedtime as needed.      ibuprofen (ADVIL,MOTRIN) 600 MG tablet Take 600 mg by mouth every 6 (six) hours as needed.     ipratropium-albuterol (DUONEB) 0.5-2.5 (3) MG/3ML SOLN Take 3 mLs by nebulization every 6 (six) hours as needed.     lidocaine-prilocaine (EMLA) cream Apply to affected area once 30 g 3   lisinopril (PRINIVIL,ZESTRIL) 40 MG tablet Take 40 mg by mouth every morning.     LORazepam (ATIVAN) 0.5 MG tablet Take 1 tablet (0.5 mg total) by mouth every 12 (twelve) hours as needed for anxiety. 30 tablet 0   ondansetron (ZOFRAN) 8 MG tablet Take 1 tablet (8 mg total) by mouth every 8 (eight) hours as needed for nausea or vomiting. Start on the third day after carboplatin. 30 tablet 1   potassium chloride (KLOR-CON) 10 MEQ tablet Take 1 tablet (10 mEq total) by mouth daily. (Patient taking differently: Take 10 mEq by mouth every morning.) 90 tablet 1   pravastatin (PRAVACHOL) 20 MG tablet Take 20 mg by mouth at bedtime.     prochlorperazine (COMPAZINE) 10 MG tablet Take 1 tablet (10 mg total) by mouth every 6 (six) hours as needed for nausea or vomiting. 30 tablet 1     senna (SENOKOT) 8.6 MG TABS tablet Take 2 tablets (17.2 mg total) by mouth daily. (Patient taking differently: Take 2 tablets by mouth as needed.) 120 each 0   sildenafil (VIAGRA) 50 MG tablet Take 1-2 tablets (50-100 mg total) by  mouth daily as needed for erectile dysfunction. 30 tablet 6   spironolactone (ALDACTONE) 25 MG tablet Take by mouth.     tamsulosin (FLOMAX) 0.4 MG CAPS capsule Take 0.4 mg by mouth daily after breakfast.     torsemide (DEMADEX) 20 MG tablet Take 20 mg by mouth every morning.     Vibegron (GEMTESA) 75 MG TABS Take 1 tablet (75 mg total) by mouth daily. (Patient taking differently: Take 1 tablet by mouth every morning.) 30 tablet 0   No current facility-administered medications for this visit.   Facility-Administered Medications Ordered in Other Visits  Medication Dose Route Frequency Provider Last Rate Last Admin   sodium chloride flush (NS) 0.9 % injection 10 mL  10 mL Intravenous PRN Aya Geisel, MD   10 mL at 03/18/18 0858     PHYSICAL EXAMINATION: ECOG PERFORMANCE STATUS: 0 - Asymptomatic Vitals:   01/01/23 0822  BP: 124/68  Pulse: 82  Temp: (!) 95.9 F (35.5 C)  SpO2: (!) 86%   Filed Weights   01/01/23 0822  Weight: 207 lb 14.4 oz (94.3 kg)    Physical Exam Constitutional:      General: He is not in acute distress. [name]:     Head: Normocephalic and atraumatic.  Eyes:     General: No scleral icterus.    Pupils: Pupils are equal, round, and reactive to light.  Cardiovascular:     Rate and Rhythm: Normal rate and regular rhythm.     Heart sounds: Normal heart sounds.  Pulmonary:     Effort: Pulmonary effort is normal. No respiratory distress.     Comments: Decreased breath sound bilaterally Abdominal:     General: Bowel sounds are normal. There is no distension.     Palpations: Abdomen is soft.  Musculoskeletal:        General: No deformity. Normal range of motion.     Cervical back: Normal range of motion and neck supple.  Lymphadenopathy:     Cervical: No cervical adenopathy.  Skin:    General: Skin is warm and dry.     Findings: No erythema or rash.     Comments: Left supraclavicular fossa fullness, chronic.  Neurological:     Mental Status: He is alert and  oriented to person, place, and time. Mental status is at baseline.     Cranial Nerves: No cranial nerve deficit.      LABORATORY DATA:  I have reviewed the data as listed    Latest Ref Rng & Units 01/01/2023    8:25 AM 12/17/2022    9:36 PM 11/21/2022   10:13 AM  CBC  WBC 4.0 - 10.5 K/uL 11.1  8.3  15.3   Hemoglobin 13.0 - 17.0 g/dL 14.5  14.9  15.3   Hematocrit 39.0 - 52.0 % 43.8  45.0  45.7   Platelets 150 - 400 K/uL 308  359  270       Latest Ref Rng & Units 01/01/2023    8:24 AM 12/17/2022    9:36 PM 12/17/2022    2:56 PM  CMP  Glucose 70 - 99 mg/dL 135   141   BUN 8 - 23 mg/dL 13   11   Creatinine 0.61 - 1.24 mg/dL 0.92    0.81  0.82   Sodium 135 - 145 mmol/L 137   138   Potassium 3.5 - 5.1 mmol/L 4.7   3.6   Chloride 98 - 111 mmol/L 99   101   CO2 22 - 32 mmol/L 30   29   Calcium 8.9 - 10.3 mg/dL 9.0   8.1   Total Protein 6.5 - 8.1 g/dL 6.9   6.4   Total Bilirubin 0.3 - 1.2 mg/dL 0.6   0.6   Alkaline Phos 38 - 126 U/L 59   58   AST 15 - 41 U/L 13   16   ALT 0 - 44 U/L 11   13      RADIOGRAPHIC STUDIES: I have personally reviewed the radiological images as listed and agreed with the findings in the report.. US THORACENTESIS ASP PLEURAL SPACE W/IMG GUIDE  Result Date: 12/28/2022 INDICATION: Right pleural effusion EXAM: ULTRASOUND GUIDED RIGHT THORACENTESIS MEDICATIONS: None. COMPLICATIONS: None immediate. PROCEDURE: An ultrasound guided thoracentesis was thoroughly discussed with the patient and questions answered. The benefits, risks, alternatives and complications were also discussed. The patient understands and wishes to proceed with the procedure. Written consent was obtained. Ultrasound was performed to localize and mark an adequate pocket of fluid in the right chest. The area was then prepped and draped in the normal sterile fashion. 1% Lidocaine was used for local anesthesia. Under ultrasound guidance a 19 gauge, 7-cm, Yueh catheter was introduced. Thoracentesis was  performed. The catheter was removed and a dressing applied. FINDINGS: A total of approximately 2.0 L of thin serosanguineous fluid was removed. IMPRESSION: Successful ultrasound guided right thoracentesis yielding 2.0 L of serosanguineous pleural fluid. Electronically Signed   By: Yasser  El-Abd M.D.   On: 12/28/2022 15:30   DG Chest 2 View  Result Date: 12/28/2022 CLINICAL DATA:  Right pleural effusion EXAM: CHEST - 2 VIEW COMPARISON:  Previous studies including the examination of 12/18/2022 FINDINGS: There is a interval increase in size of large right pleural effusion. There is opacification of right mid and right lower lung fields. Homogeneous density in the medial right upper lung field corresponds to her case is seen right upper lobe. There is faint 2.1 cm nodular density in left parahilar region corresponding to the lung nodule in left lower lobe seen in previous CT. Scarring is seen in the left apex. IMPRESSION: There is large right pleural effusion with interval increase. Homogeneous opacity in the medial right upper lung field corresponds to atelectasis seen in previous CT. There is 2.1 cm nodular density in the left mid lung field corresponding to a nodular density in left lower lobe seen in previous CT. Electronically Signed   By: Palani  Rathinasamy M.D.   On: 12/28/2022 14:49   DG Chest Port 1 View  Result Date: 12/28/2022 CLINICAL DATA:  Right pleural effusion, status post thoracentesis EXAM: PORTABLE CHEST 1 VIEW COMPARISON:  Previous studies including the examination of 12/25/2022 FINDINGS: Transverse diameter of heart is increased. There is homogeneous opacity in the medial right upper lung field consistent with previously described atelectasis in right upper lobe. There is decreasing large right pleural effusion. There is no pneumothorax. Left lung is clear. Evaluation of right lower lung field is limited by the effusion. IMPRESSION: There is decrease in amount of right pleural effusion  after thoracentesis. There is no pneumothorax. Electronically Signed   By: Palani  Rathinasamy M.D.   On: 12/28/2022 14:44   DG Chest Port 1 View  Result Date: 12/18/2022 CLINICAL DATA:    Pleural effusion. EXAM: PORTABLE CHEST 1 VIEW COMPARISON:  12/17/2022 FINDINGS: Stable asymmetric elevation right hemidiaphragm. Interval decrease in right pleural effusion. Bibasilar atelectasis again noted. Cardiopericardial silhouette is at upper limits of normal for size. No acute bony abnormality. Soft tissue fullness in the medial right apex is similar to prior, compatible with right upper lobe collapse. IMPRESSION: 1. Interval decrease in right pleural effusion. 2. Persistent right upper lobe collapse. Electronically Signed   By: Eric  Mansell M.D.   On: 12/18/2022 12:20   US THORACENTESIS ASP PLEURAL SPACE W/IMG GUIDE  Result Date: 12/17/2022 INDICATION: Patient with right lung cancer with recurrent pleural effusion. Interventional radiology asked to perform a therapeutic thoracentesis. EXAM: ULTRASOUND GUIDED THORACENTESIS MEDICATIONS: 1% lidocaine 10 mL COMPLICATIONS: None immediate. PROCEDURE: An ultrasound guided thoracentesis was thoroughly discussed with the patient and questions answered. The benefits, risks, alternatives and complications were also discussed. The patient understands and wishes to proceed with the procedure. Written consent was obtained. Ultrasound was performed to localize and mark an adequate pocket of fluid in the right chest. The area was then prepped and draped in the normal sterile fashion. 1% Lidocaine was used for local anesthesia. Under ultrasound guidance a 6 Fr Safe-T-Centesis catheter was introduced. Thoracentesis was performed. The catheter was removed and a dressing applied. FINDINGS: A total of approximately 1.5 L of amber colored fluid was removed. IMPRESSION: Successful ultrasound guided right thoracentesis yielding 1.5 L of pleural fluid. Procedure performed by Jamie  Covington NP and supervised by Dr. McCullough. Electronically Signed   By: Heath  McCullough M.D.   On: 12/17/2022 20:15   CT SUPER D CHEST WO MONARCH PILOT  Result Date: 12/17/2022 CLINICAL DATA:  Non-small cell lung cancer, ongoing immunotherapy, pleural metastatic disease * Tracking Code: BO * EXAM: CT CHEST WITHOUT CONTRAST TECHNIQUE: Multidetector CT imaging of the chest was performed using thin slice collimation for electromagnetic bronchoscopy planning purposes, without intravenous contrast. RADIATION DOSE REDUCTION: This exam was performed according to the departmental dose-optimization program which includes automated exposure control, adjustment of the mA and/or kV according to patient size and/or use of iterative reconstruction technique. COMPARISON:  Multiple exams, including PET-CT 12/03/2022 FINDINGS: Cardiovascular: Coronary, aortic arch, and branch vessel atherosclerotic vascular disease. Trace pericardial effusion. Mediastinum/Nodes: Mediastinal adenopathy was recently hypermetabolic with representative nodes including a 1.1 cm AP window lymph node on image 58 of series 2 and a 1.6 cm subcarinal lymph node on image 72 series 2. Lungs/Pleura: Very large right pleural effusion with only a small amount of aerated right middle lobe and complete atelectasis of the right lower lobe and right upper lobe. Extensive rind like density along the pleural is irregular and compatible with pleural metastatic disease. There is also nodular infiltration of the pleural adipose tissue at the right anterior lung base adjacent to the pericardium compatible with tumor infiltration. The patient has a known right hilar and infrahilar mass which is obscured by the surrounding atelectasis. Severe emphysema. A pleural-based nodule in the left lower lobe superior segment measures 2.2 by 2.4 cm on image 79 of series 4 and was hypermetabolic on the 12/03/2022 PET-CT compatible with a contralateral metastatic lesion. Upper  Abdomen: No adrenal mass. Scattered colonic diverticula. Abdominal aortic atherosclerosis. Musculoskeletal: Probable hemangioma at the L2 vertebral level. Not previously hypermetabolic. IMPRESSION: 1. Very large right pleural effusion with only a small amount of aerated right middle lobe and complete atelectasis of the right lower lobe and right upper lobe. 2. Extensive rind like density along the pleural is   irregular and compatible with pleural metastatic disease. There is also nodular infiltration of the pleural adipose tissue at the right anterior lung base adjacent to the pericardium compatible with tumor infiltration. 3. The patient has a known right hilar and infrahilar mass which is obscured by the surrounding atelectasis. 4. Mediastinal adenopathy was recently hypermetabolic on the 12/03/2022 PET-CT compatible with metastatic disease. 5. A pleural-based nodule in the left lower lobe superior segment measures 2.2 by 2.4 cm and was hypermetabolic on the 12/03/2022 PET-CT compatible with a contralateral metastatic lesion. 6. Severe emphysema. 7. Coronary, aortic arch, and branch vessel atherosclerotic vascular disease. 8. Trace pericardial effusion. 9. Probable hemangioma at the L2 vertebral level. Aortic Atherosclerosis (ICD10-I70.0) and Emphysema (ICD10-J43.9). Electronically Signed   By: Walter  Liebkemann M.D.   On: 12/17/2022 18:06   DG Chest Port 1 View  Result Date: 12/17/2022 CLINICAL DATA:  Status post bronchoscopy EXAM: PORTABLE CHEST 1 VIEW COMPARISON:  11/20/2022 FINDINGS: There is only a miniscule amount of aerated lung in the right mid upper chest, otherwise the right hemithorax is completely opacified. Indistinct pulmonary vasculature on the left possibly from pulmonary venous hypertension. No blunting of the left costophrenic angle. No visible pneumothorax. IMPRESSION: 1. Near complete opacification of the right hemithorax, with only a miniscule amount of aerated lung in the right mid upper  chest. 2. Indistinct pulmonary vasculature on the left possibly from pulmonary venous hypertension. Electronically Signed   By: Walter  Liebkemann M.D.   On: 12/17/2022 17:56   DG Chest Port 1 View  Result Date: 12/17/2022 CLINICAL DATA:  Status post thoracentesis EXAM: PORTABLE CHEST 1 VIEW COMPARISON:  Chest x-ray dated June 17th 2020 FINDINGS: Visualized cardiac and mediastinal contours are unchanged. Moderate right pleural effusion, decreased in size when compared with the prior exam. Lung predominant heterogeneous opacities, likely atelectasis. Persistent right upper lobe collapse. No evidence of pneumothorax. IMPRESSION: 1. Moderate right pleural effusion, decreased in size. 2. No evidence of pneumothorax. 3. Persistent right upper lobe collapse. Electronically Signed   By: Leah  Strickland M.D.   On: 12/17/2022 16:42   MR Brain W Wo Contrast  Result Date: 12/16/2022 CLINICAL DATA:  Lung cancer, evaluate for metastatic disease EXAM: MRI HEAD WITHOUT AND WITH CONTRAST TECHNIQUE: Multiplanar, multiecho pulse sequences of the brain and surrounding structures were obtained without and with intravenous contrast. CONTRAST:  10mL GADAVIST GADOBUTROL 1 MMOL/ML IV SOLN COMPARISON:  08/25/2019 FINDINGS: Brain: No restricted diffusion to suggest acute or subacute infarct. No abnormal parenchymal or meningeal enhancement. No acute hemorrhage, mass, mass effect, or midline shift. No hydrocephalus or extra-axial collection. Partial empty sella. Craniocervical junction. No hemosiderin deposition to suggest remote hemorrhage. Normal cerebral volume for age. Scattered T2 hyperintense signal in the periventricular white matter, likely the sequela of mild chronic small vessel ischemic disease. Vascular: Normal arterial flow voids. Skull and upper cervical spine: Normal marrow signal. Sinuses/Orbits: Clear paranasal sinuses. No acute finding in the orbits. Status post left lens replacement. Other: The mastoid air cells  are well aerated. IMPRESSION: No acute intracranial process. No evidence of metastatic disease in the brain. Electronically Signed   By: Alison  Vasan M.D.   On: 12/16/2022 03:30   NM PET Image Restag (PS) Skull Base To Thigh  Result Date: 12/03/2022 CLINICAL DATA:  Subsequent treatment strategy for non-small cell lung cancer. EXAM: NUCLEAR MEDICINE PET SKULL BASE TO THIGH TECHNIQUE: 12.1 mCi F-18 FDG was injected intravenously. Full-ring PET imaging was performed from the skull base to thigh after the radiotracer.   CT data was obtained and used for attenuation correction and anatomic localization. Fasting blood glucose: 101 mg/dl COMPARISON:  PET-CT 02/05/2022 CT 11/16/2022 FINDINGS: Mediastinal blood pool activity: SUV max 2.1 Liver activity: SUV max NA NECK: No hypermetabolic lymph nodes in the neck. Incidental CT findings: None. CHEST: Rim intense hypermetabolic activity within the RIGHT hemithorax pleural space. This thin rim of metabolic 2 borders in large RIGHT pleural effusion. More nodular pleural thickening in the medial aspect of the RIGHT middle lobe with SUV max equal 8.5. Nodular thickening measuring up to 9 mm (image 70/4). Hypermetabolic thickening at the RIGHT hilum measures 3.5 cm surrounds the RIGHT upper lobe bronchi. Activity is intense with SUV max equal 8.5. There is postobstructive collapse of the RIGHT upper lobe. Large RIGHT effusion. Within the superior segment LEFT lower lobe 22 mm hypermetabolic nodule with SUV max equal 9.5. There is small bilateral hypermetabolic mediastinal adenopathy and subcarinal adenopathy. Incidental CT findings: None. ABDOMEN/PELVIS: Limited view of the liver, kidneys, pancreas are unremarkable. Normal adrenal glands. Incidental CT findings: Adrenal glands normal. No metastatic adenopathy. No liver metastasis. SKELETON: No focal hypermetabolic activity to suggest skeletal metastasis. Several sclerotic lesions in the pelvis and spine without metabolic activity.  Incidental CT findings: None. IMPRESSION: 1. Hypermetabolic mass at the RIGHT hilum consistent with bronchogenic carcinoma. 2. Postobstructive collapse of the RIGHT upper lobe. 3. Uniform hypermetabolic pleural thickening in the RIGHT lung consistent with pleural metastasis. 4. Large RIGHT effusion. 5. Hypermetabolic nodule in the superior segment of the LEFT lower lobe consistent with pulmonary metastasis. Synchronous bronchogenic carcinoma versus metastatic lesion. 6. Hypermetabolic bilateral mediastinal adenopathy consistent with nodal metastasis. 7. No evidence of metastatic disease outside the thorax. 8. No skeletal metastasis. Electronically Signed   By: Stewart  Edmunds M.D.   On: 12/03/2022 16:33   US THORACENTESIS ASP PLEURAL SPACE W/IMG GUIDE  Result Date: 11/20/2022 INDICATION: Right pleural effusion EXAM: ULTRASOUND GUIDED RIGHT THORACENTESIS MEDICATIONS: 7 cc 1% lidocaine COMPLICATIONS: None immediate. PROCEDURE: An ultrasound guided thoracentesis was thoroughly discussed with the patient and questions answered. The benefits, risks, alternatives and complications were also discussed. The patient understands and wishes to proceed with the procedure. Written consent was obtained. Ultrasound was performed to localize and mark an adequate pocket of fluid in the right chest. The area was then prepped and draped in the normal sterile fashion. 1% Lidocaine was used for local anesthesia. Under ultrasound guidance a catheter was introduced. Thoracentesis was performed. The catheter was removed and a dressing applied. FINDINGS: A total of approximately 1 L of hazy yellow fluid was removed. Samples were sent to the laboratory as requested by the clinical team. IMPRESSION: Successful ultrasound guided right thoracentesis yielding 1 L of pleural fluid. Follow-up chest x-ray revealed no evidence of pneumothorax. Procedure performed by Matthew Segal, PA-C Electronically Signed   By: Yasser  El-Abd M.D.   On:  11/20/2022 15:47   DG Chest Port 1 View  Result Date: 11/20/2022 CLINICAL DATA:  s/p thoracentesis, right EXAM: PORTABLE CHEST 1 VIEW COMPARISON:  Multiple prior CTs FINDINGS: There is right upper lobe collapse. The heart appears within normal limits. No significant pleural effusion appreciated status post right thoracentesis. No pneumothorax. The left lung is clear. No acute osseous abnormality. IMPRESSION: 1. No pneumothorax status post right thoracentesis. No significant residual pleural effusion. 2. Known right upper lobe collapse. Electronically Signed   By: Yasser  El-Abd M.D.   On: 11/20/2022 14:57   CT CHEST ABDOMEN PELVIS W CONTRAST  Result Date: 11/16/2022 CLINICAL   DATA:  Lung cancer restaging, ongoing immunotherapy * Tracking Code: BO * EXAM: CT CHEST, ABDOMEN, AND PELVIS WITH CONTRAST TECHNIQUE: Multidetector CT imaging of the chest, abdomen and pelvis was performed following the standard protocol during bolus administration of intravenous contrast. RADIATION DOSE REDUCTION: This exam was performed according to the departmental dose-optimization program which includes automated exposure control, adjustment of the mA and/or kV according to patient size and/or use of iterative reconstruction technique. CONTRAST:  100mL OMNIPAQUE IOHEXOL 300 MG/ML  SOLN COMPARISON:  CT chest, 05/16/2022, PET-CT, 02/05/2022 FINDINGS: CT CHEST FINDINGS Cardiovascular: Aortic atherosclerosis. Normal heart size. Scattered left coronary artery calcifications. No pericardial effusion. Mediastinum/Nodes: Interval increase in circumferential soft tissue thickening about the right hilum (series 2, image 24). Slight interval increase in size in enlarged AP window lymph nodes, measuring up to 2.3 x 1.1 cm, previously 1.7 x 0.9 cm (series 2, image 42). Thyroid gland, trachea, and esophagus demonstrate no significant findings. Lungs/Pleura: New moderate right pleural effusion with diffuse, nodular, enhancing pleural thickening  (series 2, image 29). Proximal obstruction of the right upper lobe segmental bronchi with complete atelectasis or consolidation of the right upper lobe, new compared to prior examination (series 2, image 19). Interval enlargement of a subpleural mass of the dependent superior segment left lower lobe, measuring 2.4 x 2.1 cm, previously 1.8 x 1.2 cm (series 3, image 68). Severe underlying emphysema. Musculoskeletal: No chest wall abnormality. No acute osseous findings. CT ABDOMEN PELVIS FINDINGS Hepatobiliary: No solid liver abnormality is seen. No gallstones, gallbladder wall thickening, or biliary dilatation. Pancreas: Unremarkable. No pancreatic ductal dilatation or surrounding inflammatory changes. Spleen: Normal in size without significant abnormality. Adrenals/Urinary Tract: Adrenal glands are unremarkable. Kidneys are normal, without renal calculi, solid lesion, or hydronephrosis. Bladder is unremarkable. Stomach/Bowel: Stomach is within normal limits. Appendix appears normal. No evidence of bowel wall thickening, distention, or inflammatory changes. Pancolonic diverticulosis. Moderate burden of stool throughout the colon. Vascular/Lymphatic: Aortic atherosclerosis. No enlarged abdominal or pelvic lymph nodes. Reproductive: Prostatomegaly. Other: No abdominal wall hernia or abnormality. No ascites. Musculoskeletal: No acute osseous findings. Unchanged sclerotic, trabeculated vertebral body hemangiomata of T10 and L2 (series 6, image 100). No suspicious osseous lesions. 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 IMPRESSION: 1. New moderate right pleural effusion with diffuse, nodular, enhancing pleural thickening consistent with pleural metastatic disease. 2. Proximal obstruction of the right upper lobe segmental bronchi with complete atelectasis or consolidation of the right upper lobe, consistent with local malignant recurrence and postobstructive atelectasis of the upper lobe. 3.  Interval increase in circumferential soft tissue thickening about the right hilum. Slight interval increase in size in enlarged AP window lymph nodes. 4. Interval enlargement of a subpleural mass of the dependent superior segment left lower lobe consistent with an enlarging pulmonary metastasis or metachronous primary lung malignancy. 5. Emphysema. 6. Coronary artery disease. 7. Prostatomegaly. These results will be called to the ordering clinician or representative by the Radiologist Assistant, and communication documented in the PACS or [facility] Dashboard. Aortic Atherosclerosis (ICD10-I70.0) and Emphysema (ICD10-J43.9). Electronically Signed   By: Alex D Bibbey M.D.   On: 11/16/2022 17:13    

## 2023-01-01 NOTE — Assessment & Plan Note (Addendum)
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  Brain MRI is negative for metastasis. Thoracentesis cytology and biopsy via bronchoscopy both confirm adenocarcinoma Recurrent stage IV lung adenocarcinoma. Recommend systemic chemotherapy plus immunotherapy. [facility] NGS/PD-L1 are pending. recommend systemic chemotherapy with carboplatin/Taxol/bevacizumab.   Will add immunotherapy after confirming no actionable mutations on NGS.   If PD-L1 is above 50%, in the future, he may be maintained on monotherapy immunotherapy.  Labs are reviewed and discussed with patient. Proceed with carboplatin/Taxol/bevacizumab Rationale and side effects were reviewed and discussed with patient and daughter.  They agree with the plan.  Follow up with Dr. Rushie Chestnut  for palliative radiation for occluded right upper lobe.

## 2023-01-01 NOTE — Assessment & Plan Note (Signed)
Refer to nutritionist 

## 2023-01-01 NOTE — Assessment & Plan Note (Signed)
Palliative therapeutic thoracentesis as needed.  Expect to improve with chemotherapy treatment.  Consider pleural catheter if refractory to treatment.

## 2023-01-02 ENCOUNTER — Other Ambulatory Visit: Payer: Self-pay

## 2023-01-02 ENCOUNTER — Ambulatory Visit
Admission: RE | Admit: 2023-01-02 | Discharge: 2023-01-02 | Disposition: A | Discharge status: Home / Self Care | Payer: Medicare Other | Source: Ambulatory Visit | Attending: Radiation Oncology | Admitting: Radiation Oncology

## 2023-01-02 ENCOUNTER — Inpatient Hospital Stay: Payer: Medicare Other

## 2023-01-02 DIAGNOSIS — Z510 Encounter for antineoplastic radiation therapy: Secondary | ICD-10-CM | POA: Diagnosis not present

## 2023-01-02 LAB — RAD ONC ARIA SESSION SUMMARY
Course Elapsed Days: 8
Plan Fractions Treated to Date: 7
Plan Prescribed Dose Per Fraction: 2 Gy
Plan Total Fractions Prescribed: 20
Plan Total Prescribed Dose: 40 Gy
Reference Point Dosage Given to Date: 14 Gy
Reference Point Session Dosage Given: 2 Gy
Session Number: 7

## 2023-01-04 ENCOUNTER — Other Ambulatory Visit: Payer: Self-pay

## 2023-01-04 ENCOUNTER — Encounter: Payer: Self-pay | Admitting: Oncology

## 2023-01-04 ENCOUNTER — Ambulatory Visit
Admission: RE | Admit: 2023-01-04 | Discharge: 2023-01-04 | Disposition: A | Discharge status: Home / Self Care | Payer: Medicare Other | Source: Ambulatory Visit | Attending: Radiation Oncology | Admitting: Radiation Oncology

## 2023-01-04 DIAGNOSIS — Z510 Encounter for antineoplastic radiation therapy: Secondary | ICD-10-CM | POA: Diagnosis not present

## 2023-01-04 DIAGNOSIS — C3491 Malignant neoplasm of unspecified part of right bronchus or lung: Secondary | ICD-10-CM

## 2023-01-04 LAB — RAD ONC ARIA SESSION SUMMARY
Course Elapsed Days: 10
Plan Fractions Treated to Date: 8
Plan Prescribed Dose Per Fraction: 2 Gy
Plan Total Fractions Prescribed: 20
Plan Total Prescribed Dose: 40 Gy
Reference Point Dosage Given to Date: 16 Gy
Reference Point Session Dosage Given: 2 Gy
Session Number: 8

## 2023-01-07 ENCOUNTER — Ambulatory Visit
Admission: RE | Admit: 2023-01-07 | Discharge: 2023-01-07 | Disposition: A | Discharge status: Home / Self Care | Payer: Medicare Other | Source: Ambulatory Visit | Attending: Radiation Oncology | Admitting: Radiation Oncology

## 2023-01-07 ENCOUNTER — Inpatient Hospital Stay: Payer: Medicare Other

## 2023-01-07 ENCOUNTER — Other Ambulatory Visit: Payer: Self-pay

## 2023-01-07 DIAGNOSIS — Z510 Encounter for antineoplastic radiation therapy: Secondary | ICD-10-CM | POA: Diagnosis not present

## 2023-01-07 LAB — RAD ONC ARIA SESSION SUMMARY
Course Elapsed Days: 13
Plan Fractions Treated to Date: 9
Plan Prescribed Dose Per Fraction: 2 Gy
Plan Total Fractions Prescribed: 20
Plan Total Prescribed Dose: 40 Gy
Reference Point Dosage Given to Date: 18 Gy
Reference Point Session Dosage Given: 2 Gy
Session Number: 9

## 2023-01-08 ENCOUNTER — Encounter
Admission: RE | Disposition: A | Discharge status: Home / Self Care | Payer: Self-pay | Source: Home / Self Care | Attending: Vascular Surgery

## 2023-01-08 ENCOUNTER — Other Ambulatory Visit: Payer: Self-pay

## 2023-01-08 ENCOUNTER — Ambulatory Visit
Admission: RE | Admit: 2023-01-08 | Discharge: 2023-01-08 | Disposition: A | Discharge status: Home / Self Care | Payer: Medicare Other | Source: Home / Self Care | Attending: Vascular Surgery | Admitting: Vascular Surgery

## 2023-01-08 ENCOUNTER — Inpatient Hospital Stay: Payer: Medicare Other

## 2023-01-08 ENCOUNTER — Ambulatory Visit
Admission: RE | Admit: 2023-01-08 | Discharge: 2023-01-08 | Disposition: A | Discharge status: Home / Self Care | Payer: Medicare Other | Source: Ambulatory Visit | Attending: Radiation Oncology | Admitting: Radiation Oncology

## 2023-01-08 ENCOUNTER — Encounter: Payer: Self-pay | Admitting: Vascular Surgery

## 2023-01-08 DIAGNOSIS — Z87891 Personal history of nicotine dependence: Secondary | ICD-10-CM | POA: Insufficient documentation

## 2023-01-08 DIAGNOSIS — J90 Pleural effusion, not elsewhere classified: Secondary | ICD-10-CM | POA: Insufficient documentation

## 2023-01-08 DIAGNOSIS — F419 Anxiety disorder, unspecified: Secondary | ICD-10-CM | POA: Insufficient documentation

## 2023-01-08 DIAGNOSIS — Z510 Encounter for antineoplastic radiation therapy: Secondary | ICD-10-CM | POA: Diagnosis not present

## 2023-01-08 DIAGNOSIS — C3490 Malignant neoplasm of unspecified part of unspecified bronchus or lung: Secondary | ICD-10-CM | POA: Diagnosis not present

## 2023-01-08 DIAGNOSIS — C3491 Malignant neoplasm of unspecified part of right bronchus or lung: Secondary | ICD-10-CM

## 2023-01-08 HISTORY — PX: PORTA CATH INSERTION: CATH118285

## 2023-01-08 LAB — CBC WITH DIFFERENTIAL (CANCER CENTER ONLY)
Abs Immature Granulocytes: 0.03 10*3/uL (ref 0.00–0.07)
Basophils Absolute: 0 10*3/uL (ref 0.0–0.1)
Basophils Relative: 0 %
Eosinophils Absolute: 0 10*3/uL (ref 0.0–0.5)
Eosinophils Relative: 1 %
HCT: 44.7 % (ref 39.0–52.0)
Hemoglobin: 14.7 g/dL (ref 13.0–17.0)
Immature Granulocytes: 1 %
Lymphocytes Relative: 6 %
Lymphs Abs: 0.3 10*3/uL — ABNORMAL LOW (ref 0.7–4.0)
MCH: 27.9 pg (ref 26.0–34.0)
MCHC: 32.9 g/dL (ref 30.0–36.0)
MCV: 84.8 fL (ref 80.0–100.0)
Monocytes Absolute: 0.3 10*3/uL (ref 0.1–1.0)
Monocytes Relative: 6 %
Neutro Abs: 4.2 10*3/uL (ref 1.7–7.7)
Neutrophils Relative %: 86 %
Platelet Count: 267 10*3/uL (ref 150–400)
RBC: 5.27 MIL/uL (ref 4.22–5.81)
RDW: 12.7 % (ref 11.5–15.5)
WBC Count: 4.9 10*3/uL (ref 4.0–10.5)
nRBC: 0 % (ref 0.0–0.2)

## 2023-01-08 LAB — CMP (CANCER CENTER ONLY)
ALT: 18 U/L (ref 0–44)
AST: 19 U/L (ref 15–41)
Albumin: 3.2 g/dL — ABNORMAL LOW (ref 3.5–5.0)
Alkaline Phosphatase: 59 U/L (ref 38–126)
Anion gap: 8 (ref 5–15)
BUN: 14 mg/dL (ref 8–23)
CO2: 29 mmol/L (ref 22–32)
Calcium: 8.9 mg/dL (ref 8.9–10.3)
Chloride: 99 mmol/L (ref 98–111)
Creatinine: 0.7 mg/dL (ref 0.61–1.24)
GFR, Estimated: >60 mL/min (ref >60)
Glucose, Bld: 127 mg/dL — ABNORMAL HIGH (ref 70–99)
Potassium: 4 mmol/L (ref 3.5–5.1)
Sodium: 136 mmol/L (ref 135–145)
Total Bilirubin: 0.4 mg/dL (ref 0.3–1.2)
Total Protein: 6.8 g/dL (ref 6.5–8.1)

## 2023-01-08 LAB — RAD ONC ARIA SESSION SUMMARY
Course Elapsed Days: 14
Plan Fractions Treated to Date: 10
Plan Prescribed Dose Per Fraction: 2 Gy
Plan Total Fractions Prescribed: 20
Plan Total Prescribed Dose: 40 Gy
Reference Point Dosage Given to Date: 20 Gy
Reference Point Session Dosage Given: 2 Gy
Session Number: 10

## 2023-01-08 LAB — GLUCOSE, CAPILLARY: Glucose-Capillary: 99 mg/dL (ref 70–99)

## 2023-01-08 SURGERY — PORTA CATH INSERTION
Anesthesia: Moderate Sedation

## 2023-01-08 MED ORDER — FAMOTIDINE 20 MG PO TABS: 40.0000 mg | ORAL_TABLET | Freq: Once | ORAL | Status: DC | PRN

## 2023-01-08 MED ORDER — HEPARIN (PORCINE) IN NACL 1000-0.9 UT/500ML-% IV SOLN
INTRAVENOUS | Status: DC | PRN
  Administered 2023-01-08: 500 mL

## 2023-01-08 MED ORDER — SODIUM CHLORIDE 0.9 % IV SOLN
INTRAVENOUS | Status: DC
  Administered 2023-01-08: 1000 mL via INTRAVENOUS

## 2023-01-08 MED ORDER — ONDANSETRON HCL 4 MG/2ML IJ SOLN: 4.0000 mg | Freq: Four times a day (QID) | INTRAMUSCULAR | Status: DC | PRN

## 2023-01-08 MED ORDER — HYDROMORPHONE HCL 1 MG/ML IJ SOLN: 1.0000 mg | Freq: Once | INTRAMUSCULAR | Status: DC | PRN

## 2023-01-08 MED ORDER — METHYLPREDNISOLONE SODIUM SUCC 125 MG IJ SOLR: 125.0000 mg | Freq: Once | INTRAMUSCULAR | Status: DC | PRN

## 2023-01-08 MED ORDER — CEFAZOLIN SODIUM-DEXTROSE 2-4 GM/100ML-% IV SOLN
2.0000 g | INTRAVENOUS | Status: AC
  Administered 2023-01-08: 2 g via INTRAVENOUS

## 2023-01-08 MED ORDER — MIDAZOLAM HCL 2 MG/2ML IJ SOLN
INTRAMUSCULAR | Status: DC | PRN
  Administered 2023-01-08 (×2): 1 mg via INTRAVENOUS

## 2023-01-08 MED ORDER — MIDAZOLAM HCL 2 MG/2ML IJ SOLN
INTRAMUSCULAR | Status: AC
  Filled 2023-01-08: qty 2

## 2023-01-08 MED ORDER — MIDAZOLAM HCL 2 MG/ML PO SYRP: 8.0000 mg | ORAL_SOLUTION | Freq: Once | ORAL | Status: DC | PRN

## 2023-01-08 MED ORDER — SODIUM CHLORIDE 0.9 % IV SOLN
80.0000 mg | Freq: Once | INTRAVENOUS | Status: AC
  Administered 2023-01-08: 80 mg
  Filled 2023-01-08: qty 2

## 2023-01-08 MED ORDER — DIPHENHYDRAMINE HCL 50 MG/ML IJ SOLN: 50.0000 mg | Freq: Once | INTRAMUSCULAR | Status: DC | PRN

## 2023-01-08 MED ORDER — LIDOCAINE-EPINEPHRINE (PF) 1 %-1:200000 IJ SOLN
INTRAMUSCULAR | Status: DC | PRN
  Administered 2023-01-08: 20 mL

## 2023-01-08 MED ORDER — CEFAZOLIN SODIUM-DEXTROSE 2-4 GM/100ML-% IV SOLN
INTRAVENOUS | Status: AC
  Filled 2023-01-08: qty 100

## 2023-01-08 MED ORDER — FENTANYL CITRATE (PF) 100 MCG/2ML IJ SOLN
INTRAMUSCULAR | Status: DC | PRN
  Administered 2023-01-08 (×2): 25 ug via INTRAVENOUS

## 2023-01-08 MED ORDER — FENTANYL CITRATE PF 50 MCG/ML IJ SOSY
PREFILLED_SYRINGE | INTRAMUSCULAR | Status: AC
  Filled 2023-01-08: qty 1

## 2023-01-08 SURGICAL SUPPLY — 12 items
ADH SKN CLS APL DERMABOND .7 (GAUZE/BANDAGES/DRESSINGS) ×1
DERMABOND ADVANCED .7 DNX12 (GAUZE/BANDAGES/DRESSINGS) IMPLANT
GOWN STRL REUS W/TWL 2XL LVL3 (GOWN DISPOSABLE) IMPLANT
KIT PORT POWER 8FR ISP CVUE (Port) IMPLANT
PACK ANGIOGRAPHY (CUSTOM PROCEDURE TRAY) ×1 IMPLANT
PENCIL ELECTRO HAND CTR (MISCELLANEOUS) IMPLANT
SPONGE XRAY 4X4 16PLY STRL (MISCELLANEOUS) IMPLANT
SUT MNCRL AB 4-0 PS2 18 (SUTURE) IMPLANT
SUT VIC AB 3-0 SH 27 (SUTURE) ×1
SUT VIC AB 3-0 SH 27X BRD (SUTURE) IMPLANT
TOWEL GREEN STERILE FF (TOWEL DISPOSABLE) IMPLANT
TUBING CONNECTING 10 (TUBING) IMPLANT

## 2023-01-08 NOTE — Discharge Instructions (Signed)
Implanted [facility] Guide  An implanted port is a type of central line that is placed under the skin. Central lines are used to provide IV access when treatment or nutrition needs to be given through a person's veins. Implanted ports are used for long-term IV access. An implanted port may be placed because: You need IV medicine that would be irritating to the small veins in your hands or arms. You need long-term IV medicines, such as antibiotics. You need IV nutrition for a long period. You need frequent blood draws for lab tests. You need dialysis.   Implanted ports are usually placed in the chest area, but they can also be placed in the upper arm, the abdomen, or the leg. An implanted port has two main parts: Reservoir. The reservoir is round and will appear as a small, raised area under your skin. The reservoir is the part where a needle is inserted to give medicines or draw blood. Catheter. The catheter is a thin, flexible tube that extends from the reservoir. The catheter is placed into a large vein. Medicine that is inserted into the reservoir goes into the catheter and then into the vein.   How will I care for my incision  You may shower tomorrow Please remove dressing in 24hrs not other skin care is needed  How is my port accessed? Special steps must be taken to access the port: Before the port is accessed, a numbing cream can be placed on the skin. This helps numb the skin over the port site. Your health care provider uses a sterile technique to access the port. Your health care provider must put on a mask and sterile gloves. The skin over your port is cleaned carefully with an antiseptic and allowed to dry. The port is gently pinched between sterile gloves, and a needle is inserted into the port. Only "non-coring" port needles should be used to access the port. Once the port is accessed, a blood return should be checked. This helps ensure that the port is in the vein and is not  clogged. If your port needs to remain accessed for a constant infusion, a clear (transparent) bandage will be placed over the needle site. The bandage and needle will need to be changed every week, or as directed by your health care provider.   What is flushing? Flushing helps keep the port from getting clogged. Follow your health care provider's instructions on how and when to flush the port. Ports are usually flushed with saline solution or a medicine called heparin. The need for flushing will depend on how the port is used. If the port is used for intermittent medicines or blood draws, the port will need to be flushed: After medicines have been given. After blood has been drawn. As part of routine maintenance. If a constant infusion is running, the port may not need to be flushed.   How long will my port stay implanted? The port can stay in for as long as your health care provider thinks it is needed. When it is time for the port to come out, surgery will be done to remove it. The procedure is similar to the one performed when the port was put in. When should I seek immediate medical care? When you have an implanted port, you should seek immediate medical care if: You notice a bad smell coming from the incision site. You have swelling, redness, or drainage at the incision site. You have more swelling or pain at the  port site or the surrounding area. You have a fever that is not controlled with medicine.   This information is not intended to replace advice given to you by your health care provider. Make sure you discuss any questions you have with your health care provider. Document Released: 06/18/2005 Document Revised: 11/24/2015 Document Reviewed: 02/23/2013 Elsevier Interactive Patient Education  2017 [employer].

## 2023-01-08 NOTE — Progress Notes (Signed)
Patient states oxygen levels run between 83 and 88 percent at home. Patient states he will talk with Dr. Cathie Hoops tomorrow at appointment about increasing oxygen.

## 2023-01-08 NOTE — Interval H&P Note (Signed)
History and Physical Interval Note:  01/08/2023 11:54 AM  Mark Mccarty  has presented today for surgery, with the diagnosis of Porta Cath Placement    R lung Ca.  The various methods of treatment have been discussed with the patient and family. After consideration of risks, benefits and other options for treatment, the patient has consented to  Procedure(s): PORTA CATH INSERTION (N/A) as a surgical intervention.  The patient's history has been reviewed, patient examined, no change in status, stable for surgery.  I have reviewed the patient's chart and labs.  Questions were answered to the patient's satisfaction.     Festus Barren

## 2023-01-08 NOTE — Op Note (Signed)
      [facility]       Operative Note  Date: 01/08/2023  Preoperative diagnosis:  1. Lung cancer  Postoperative diagnosis:  Same as above  Procedures: #1. Ultrasound guidance for vascular access to the right internal jugular vein. #2. Fluoroscopic guidance for placement of catheter. #3. Placement of CT compatible Port-A-Cath, right internal jugular vein.  Surgeon: Festus Barren, MD.   Anesthesia: Local with moderate conscious sedation for approximately 15  minutes using 2 mg of Versed and 50 mcg of Fentanyl  Fluoroscopy time: less than 1 minute  Contrast used: 0  Estimated blood loss: 10 cc  Indication for the procedure:  The patient is a 72 y.o.male with lung cancer.  The patient needs a Port-A-Cath for durable venous access, chemotherapy, lab draws, and CT scans. We are asked to place this. Risks and benefits were discussed and informed consent was obtained.  Description of procedure: The patient was brought to the vascular and interventional radiology suite.  Moderate conscious sedation was administered throughout the procedure during a face to face encounter with the patient with my supervision of the RN administering medicines and monitoring the patient's vital signs, pulse oximetry, telemetry and mental status throughout from the start of the procedure until the patient was taken to the recovery room. The right neck chest and shoulder were sterilely prepped and draped, and a sterile surgical field was created. Ultrasound was used to help visualize a patent right internal jugular vein. This was then accessed under direct ultrasound guidance without difficulty with the Seldinger needle and a permanent image was recorded. A J-wire was placed. After skin nick and dilatation, the peel-away sheath was then placed over the wire. I then anesthetized an area under the clavicle approximately 1-2 fingerbreadths. A transverse incision was created and an inferior pocket was  created with electrocautery and blunt dissection. The port was then brought onto the field, placed into the pocket and secured to the chest wall with 2 Prolene sutures. The catheter was connected to the port and tunneled from the subclavicular incision to the access site. Fluoroscopic guidance was then used to cut the catheter to an appropriate length. The catheter was then placed through the peel-away sheath and the peel-away sheath was removed. The catheter tip was parked in excellent location under fluorocoscopic guidance in the SVC. The pocket was then irrigated with antibiotic impregnated saline and the wound was closed with a running 3-0 Vicryl and a 4-0 Monocryl. The access incision was closed with a single 4-0 Monocryl. The Huber needle was used to withdraw blood and flush the port with heparinized saline. Dermabond was then placed as a dressing. The patient tolerated the procedure well and was taken to the recovery room in stable condition.   Festus Barren 01/08/2023 1:16 PM   This note was created with Dragon Medical transcription system. Any errors in dictation are purely unintentional.

## 2023-01-09 ENCOUNTER — Telehealth: Payer: Self-pay

## 2023-01-09 ENCOUNTER — Inpatient Hospital Stay (HOSPITAL_BASED_OUTPATIENT_CLINIC_OR_DEPARTMENT_OTHER): Payer: Medicare Other | Admitting: Oncology

## 2023-01-09 ENCOUNTER — Ambulatory Visit
Admission: RE | Admit: 2023-01-09 | Discharge: 2023-01-09 | Disposition: A | Discharge status: Home / Self Care | Payer: Medicare Other | Attending: Oncology | Admitting: Oncology

## 2023-01-09 ENCOUNTER — Ambulatory Visit
Admission: RE | Admit: 2023-01-09 | Discharge: 2023-01-09 | Disposition: A | Discharge status: Home / Self Care | Payer: Medicare Other | Source: Ambulatory Visit | Attending: Radiation Oncology | Admitting: Radiation Oncology

## 2023-01-09 ENCOUNTER — Inpatient Hospital Stay: Payer: Medicare Other

## 2023-01-09 ENCOUNTER — Other Ambulatory Visit: Payer: Self-pay

## 2023-01-09 ENCOUNTER — Ambulatory Visit
Admission: RE | Admit: 2023-01-09 | Discharge: 2023-01-09 | Disposition: A | Discharge status: Home / Self Care | Payer: Medicare Other | Source: Ambulatory Visit | Attending: Oncology | Admitting: Oncology

## 2023-01-09 ENCOUNTER — Encounter: Payer: Self-pay | Admitting: Vascular Surgery

## 2023-01-09 VITALS — BP 103/77 | HR 114 | Temp 97.8°F | Resp 20 | Wt 206.0 lb

## 2023-01-09 DIAGNOSIS — R0602 Shortness of breath: Secondary | ICD-10-CM | POA: Diagnosis not present

## 2023-01-09 DIAGNOSIS — J90 Pleural effusion, not elsewhere classified: Secondary | ICD-10-CM | POA: Diagnosis not present

## 2023-01-09 DIAGNOSIS — F411 Generalized anxiety disorder: Secondary | ICD-10-CM

## 2023-01-09 DIAGNOSIS — Z510 Encounter for antineoplastic radiation therapy: Secondary | ICD-10-CM | POA: Diagnosis not present

## 2023-01-09 DIAGNOSIS — C801 Malignant (primary) neoplasm, unspecified: Secondary | ICD-10-CM

## 2023-01-09 DIAGNOSIS — R634 Abnormal weight loss: Secondary | ICD-10-CM

## 2023-01-09 DIAGNOSIS — C3491 Malignant neoplasm of unspecified part of right bronchus or lung: Secondary | ICD-10-CM

## 2023-01-09 LAB — RAD ONC ARIA SESSION SUMMARY
Course Elapsed Days: 15
Plan Fractions Treated to Date: 11
Plan Prescribed Dose Per Fraction: 2 Gy
Plan Total Fractions Prescribed: 20
Plan Total Prescribed Dose: 40 Gy
Reference Point Dosage Given to Date: 22 Gy
Reference Point Session Dosage Given: 2 Gy
Session Number: 11

## 2023-01-09 MED ORDER — SENNOSIDES-DOCUSATE SODIUM 8.6-50 MG PO TABS: 2.0000 | ORAL_TABLET | Freq: Every day | ORAL | 2 refills | Status: DC

## 2023-01-09 NOTE — Telephone Encounter (Signed)
Request for Thoracentesis faxed to IR.  

## 2023-01-09 NOTE — Assessment & Plan Note (Addendum)
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  Brain MRI is negative for metastasis. Thoracentesis cytology and biopsy via bronchoscopy both confirm adenocarcinoma Recurrent stage IV lung adenocarcinoma. Recommend systemic chemotherapy plus immunotherapy. [facility] NGS/PD-L1 are pending. recommend systemic chemotherapy with carboplatin/Taxol/bevacizumab.   Will add immunotherapy after confirming no actionable mutations on NGS.   If PD-L1 is above 50%, in the future, he may be maintained on monotherapy immunotherapy.  Labs are reviewed and discussed with patient. Overall he tolerates chemotherapy.  Encourage oral hydration.   Follow up with Dr. Rushie Chestnut  for palliative radiation for occluded right upper lobe.

## 2023-01-09 NOTE — Progress Notes (Signed)
Pt reports constipation, no BM in 3 days. Instructed on miralax usage.pt verbalized understanding and written instructions given.

## 2023-01-09 NOTE — Progress Notes (Signed)
Hematology/Oncology Progress note [phone] [phone]       Patient Care Team: [name], NP as PCP - General (Family Medicine) [name], RN as Registered Nurse [name], MD as Consulting Physician (Oncology) [name], MD as Referring Physician (Radiation Oncology) Wyn Quaker Marlow Baars, MD as Referring Physician (Vascular Surgery)   REASON FOR VISIT Follow up for immunotherapy treatment for stage IIIA right lung adenocarcinoma, and presumed stage I left lung cancer   ASSESSMENT & PLAN:   Cancer Staging  Primary lung adenocarcinoma, right [facility]) Staging form: Lung, AJCC 8th Edition - Clinical stage from 12/13/2017: Stage IIIA (cT3, cN1, cM0) - Signed by [name], MD on 12/13/2017 - Pathologic stage from 12/24/2022: Stage IV (pTX, pNX, pM1) - Signed by [name], MD on 12/24/2022   Primary lung adenocarcinoma, right (HCC) #History of stage IIIA cT3 N1 M0 lung adenocarcinoma, s/p concurrent chemoradiation [Aug 2019].  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  Brain MRI is negative for metastasis. Thoracentesis cytology and biopsy via bronchoscopy both confirm adenocarcinoma Recurrent stage IV lung adenocarcinoma. Recommend systemic chemotherapy plus immunotherapy. [facility] NGS/PD-L1 are pending. recommend systemic chemotherapy with carboplatin/Taxol/bevacizumab.   Will add immunotherapy after confirming no actionable mutations on NGS.   If PD-L1 is above 50%, in the future, he may be maintained on monotherapy immunotherapy.  Labs are reviewed and discussed with patient. Overall he tolerates chemotherapy.  Encourage oral hydration.   Follow up with Dr. Rushie Chestnut  for palliative radiation for occluded right upper lobe.   Anxiety associated with cancer diagnosis (HCC) Recommend low-dose lorazepam 0.5 mg every 12  hours as needed for anxiety  Pleural effusion Palliative therapeutic thoracentesis as needed.  Expect to improve with chemotherapy treatment.  Consider pleural catheter if refractory to treatment.  Today's xray showed increased of pleural effusion. He is symptomatic, will arrange thoracentesis.   Weight loss Refer to nutritionist   Orders Placed This Encounter  Procedures   DG Chest 2 View    Standing Status:   Future    Number of Occurrences:   1    Standing Expiration Date:   01/09/2024    Order Specific Question:   Reason for Exam (SYMPTOM  OR DIAGNOSIS REQUIRED)    Answer:   shortness of breath    Order Specific Question:   Preferred imaging location?    Answer:   [facility]   CBC with Differential (Cancer Center Only)    Standing Status:   Future    Standing Expiration Date:   01/09/2024   CMP (Cancer Center only)    Standing Status:   Future    Standing Expiration Date:   01/09/2024   Follow up  1 week lab MD  All questions were answered. The patient knows to call the clinic with any problems, questions or concerns.  [name], MD, PhD [facility] Hematology Oncology 01/09/2023      Oncology History:  Mark Mccarty is a  72 y.o.  male with presents for stage IIIA right lung adenocarcinoma, and presumed stage I left lung cancer now with stage IV recurrent lung adenocarcinoma. Former 43 pack year smoking history.   Oncology History  Primary lung adenocarcinoma, right (HCC)  12/13/2017 Initial Diagnosis   cT3 N1 M0 right upper lobe lung adenocarcinoma  August 2019 Finished  concurrent chemoradiation.   03/17/2019 Finished 1 year  of durvalumab treatment.   12/13/2017 Cancer Staging   Staging form: Lung, AJCC 8th Edition - Clinical stage from 12/13/2017: Stage IIIA (cT3, cN1, cM0) - Signed by [name], MD on 12/13/2017   12/31/2017 - 02/19/2018 Chemotherapy   Concurrent chemotherapy carboplatin and Taxol. With radiation   03/24/2018 - 03/17/2019 Chemotherapy    LUNG DURVALUMAB Q14D x 1 year     12/01/2020 Imaging   PET  1. The 1.1 cm in long axis left apical nodule has maximum SUV of 2.2. Given the progression in size over the last year, the appearance is suspicious for small/low-grade adenocarcinoma.  2. Upper normal sized AP window lymph nodes have maximum SUV just above blood pool, and are nonspecific.   Tumor board discussion 12/29/20  Slow-growing left upper lobe nodule.  CT-guided biopsy and biopsy via bronchoscopy are both of high risk due to underlying emphysema.  Consensus reached upon continue monitoring and repeat CT in 3 months.  If nodule continues to grow, he may benefit from empiric SBRT     03/13/2021 Imaging   CT without contrast showed slightly increase of left upper lobe nodule 12x17mm versus 11 x 8 mm.  Slightly increasing size.  Remains suspicious for bronchogenic neoplasm. Subtle nodularity along the posterior left upper lobe approximately 6 mm is unchanged.  Posttreatment changes distort the right hilum are unchanged.   04/20/2021 - 04/25/2021 Radiation Therapy   Patient received radiation to left upper lobe  nodule for presumed Stage I left upper lobe lung cancer.    07/18/2021 Imaging    CT chest without contrast showed stable posttreatment changes within the right lung.  No findings to suggest recurrent tumor or metastatic disease.  Interval decrease in size of previously characterized irregular nodule in the left upper lobe.    10/18/2021 Imaging    CT chest showed a stable examination of chronic postradiation masslike fibrosis in the right lung. Persistent subsolid nodule in the superior segment of the left lower lobe 1.3 x 1.2 cm.  Nonspecific.  COPD findings, aortic atherosclerosis.  Coronary artery disease.   01/17/2022 Imaging   01/17/2022, CT chest without contrast showed subsolid area in the left lower lobe is similar to prior imaging however with increasing nodularity.  Recommend PET scan for further  evaluation Perihilar parenchymal destruction and scarring from prior radiation in the right hilum extending into the right upper and lower lobe unchanged. Left upper lobe parenchymal scarring likely reflects treatment changes.  Marked pulmonary emphysema in the background.  Aortic atherosclerosis.   02/06/2022 Imaging   PET restaging 1. Focus of increasing nodularity in the subpleural left lower lobe identified as concerning on the recent CT chest shows only minimal FDG uptake on PET imaging today. While this is reassuring and the finding may reflect nfectious/inflammatory etiology, low-grade or well differentiated neoplasm can be poorly FDG avid. Close continued follow-up recommended. 2. Stable perifissural left lower lobe sub solid nodule, also with low level FDG uptake. Continued attention on follow-up recommende    05/17/2022 Imaging   1. Stable perihilar post radiation change and volume loss in the RIGHT hemithorax. 2. Stable mild mediastinal lymphadenopathy. 3. Peripheral nodule in the LEFT lower lobe measures slightly larger but has benign morphology. 4. Stable thickening RIGHT adrenal gland. 5. No new or progressive lung cancer identified   11/16/2022 Imaging   CT chest abdomen pelvis w contrast showed 1. New moderate right pleural effusion with diffuse, nodular,enhancing pleural thickening consistent with pleural metastatic disease. 2. Proximal obstruction of the  right upper lobe segmental bronchi with complete atelectasis or consolidation of the right upper lobe, consistent with local malignant recurrence and postobstructive atelectasis of the upper lobe. 3. Interval increase in circumferential soft tissue thickening about the right hilum. Slight interval increase in size in enlarged AP window lymph nodes. 4. Interval enlargement of a subpleural mass of the dependent superior segment left lower lobe consistent with an enlarging pulmonary metastasis or metachronous primary lung  malignancy.5. Emphysema. 6. Coronary artery disease. 7. Prostatomegaly.    11/20/2022 Procedure   Thoracentesis right side Pathology is positive for adenocarcinoma.  Insufficient material for ancillary medical testing.    12/03/2022 Imaging   PET scan showed 1. Hypermetabolic mass at the RIGHT hilum consistent with bronchogenic carcinoma. 2. Postobstructive collapse of the RIGHT upper lobe. 3. Uniform hypermetabolic pleural thickening in the RIGHT lung consistent with pleural metastasis. 4. Large RIGHT effusion. 5. Hypermetabolic nodule in the superior segment of the LEFT lower lobe consistent with pulmonary metastasis. Synchronous bronchogenic carcinoma versus metastatic lesion. 6. Hypermetabolic bilateral mediastinal adenopathy consistent with nodal metastasis. 7. No evidence of metastatic disease outside the thorax. 8. No skeletal metastasis.      12/17/2022 Procedure   Patient underwent biopsy via bronchoscopy for additional tissue Pathology showed  Right upper lobe lavage-positive for non-small cell carcinoma Right upper lobe ENB assisted biopsy showed adenocarcinoma, acinar and solid patterns. Right upper lobe FNA positive for non-small cell carcinoma.   12/24/2022 Cancer Staging   Staging form: Lung, AJCC 8th Edition - Pathologic stage from 12/24/2022: Stage IV (pTX, pNX, pM1) - Signed by [name], MD on 12/24/2022 Stage prefix: Initial diagnosis   01/01/2023 -  Chemotherapy   Patient is on Treatment Plan : LUNG NSCLC Carboplatin + Paclitaxel + Bevacizumab q21d       Multiple  liver nodules,  07/11/2020 MRI liver with and without contrast showed stable hypervascular lesions in the liver parenchyma without new or progressive findings.  Lesions remain most suggestive of benign etiology.  PET scan did not show evaluation activities.   INTERVAL HISTORY Mark Mccarty is a 72 y.o. male who has above history reviewed by me presents for follow-up of stage III A non-small  cell lung cancer. Appetite is fair. Weight is stable.  + SOB, better for 1 week after last thoracentesis. SOB is worse again.  No nausea vomiting diarrhea.   . Review of Systems  Constitutional:  Positive for fatigue and unexpected weight change. Negative for appetite change, chills and fever.  [name]:   Negative for hearing loss and voice change.   Eyes:  Negative for eye problems and icterus.  Respiratory:  Positive for shortness of breath. Negative for chest tightness and cough.   Cardiovascular:  Negative for chest pain and leg swelling.  Gastrointestinal:  Negative for abdominal distention and abdominal pain.  Endocrine: Negative for hot flashes.  Genitourinary:  Negative for difficulty urinating, dysuria and frequency.   Musculoskeletal:  Negative for arthralgias.  Skin:  Negative for itching and rash.  Neurological:  Negative for headaches, light-headedness and numbness.  [name]:  Negative for adenopathy. Does not bruise/bleed easily.  Psychiatric/Behavioral:  Negative for confusion.      MEDICAL HISTORY:  Past Medical History:  Diagnosis Date   Abnormal nuclear stress test    Anxiety    a.) on BZO (lorazepam) PRN   Asthma    Avascular necrosis of femoral head (HCC)    BPH (benign prostatic hyperplasia)    Cataract    a.) s/p  extraction on LEFT   Colon adenomas    COPD (chronic obstructive pulmonary disease) (HCC)    Coronary artery disease 11/11/2014   a.) MPI 11/11/2014: EF 56%, mild inf wall ischemia; b.) LHC 12/16/2014: 20% mRCA - med mgmt   DDD (degenerative disc disease), lumbar    Diverticulosis    DM type 2 (diabetes mellitus, type 2) (HCC)    Duodenitis    Dysphagia    Dyspnea    ED (erectile dysfunction)    a.) on PDE5i (sildenafil) PRN   Elevated PSA    Former smoker    Gastritis    GERD (gastroesophageal reflux disease)    [name]    Hilar mass    History of hiatal hernia    Hyperlipidemia    Hypertension     Hyperuricemia    Hypokalemia    Left ventricular diastolic dysfunction 10/04/2016   a.) TTE 10/04/2016: EF 50%, mild MR/TR/PR, mod AR, G1DD; b.) TTE 10/10/2017: EF 45%, triv PR, mild AR/MR/TR, G1DD; c.) TTE 03/30/2020: EF 50%, mild-mod AR, mild MR/TR/PR; d.) TTE 04/23/2022: EF >55%, mild LVH, mild LAE, triv TR, mild PR, mod AR/MR   Liver lesion    Obesity    Pedal edema    Peripheral polyneuropathy    Peyronie's disease 05/2022   Pleural effusion    Primary lung adenocarcinoma, right (HCC)    a.) stage IIIA (cT3, cN1, cM0) --> s/p concurrent chemoradiation 2019 --> carboplatin + pacilitaxel (12/31/2017 - 02/19/2018) followed by 1 year (03/24/2018 - 03/17/2019) adjuvant anti-PDL-1 mAB immunotherapy (durvalumab)   [name] (peptic ulcer disease)    Renal insufficiency    Umbilical hernia    [name]     SURGICAL HISTORY: Past Surgical History:  Procedure Laterality Date   CARDIAC CATHETERIZATION N/A 12/16/2014   Procedure: Left Heart Cath;  Surgeon: Marcina Millard, MD;  Location: [facility];  Service: Cardiovascular;  Laterality: N/A;   CATARACT EXTRACTION Left    COLONOSCOPY     COLONOSCOPY WITH PROPOFOL N/A 01/14/2020   Procedure: COLONOSCOPY WITH PROPOFOL;  Surgeon: Toledo, Boykin Nearing, MD;  Location: [facility];  Service: Gastroenterology;  Laterality: N/A;   CYST REMOVAL TRUNK     chest and back over time and it was removed   CYSTECTOMY     ENDOBRONCHIAL ULTRASOUND N/A 12/09/2017   Procedure: ENDOBRONCHIAL ULTRASOUND;  Surgeon: Shane Crutch, MD;  Location: [name];  Service: Pulmonary;  Laterality: N/A;   PORTA CATH INSERTION N/A 12/23/2017   Procedure: PORTA CATH INSERTION;  Surgeon: Annice Needy, MD;  Location: [facility];  Service: Cardiovascular;  Laterality: N/A;   PORTA CATH INSERTION N/A 01/08/2023   Procedure: PORTA CATH INSERTION;  Surgeon: Annice Needy, MD;  Location: [facility];  Service: Cardiovascular;  Laterality:  N/A;   PORTA CATH REMOVAL N/A 06/04/2022   Procedure: PORTA CATH REMOVAL;  Surgeon: Annice Needy, MD;  Location: [facility];  Service: Cardiovascular;  Laterality: N/A;   UPPER GI ENDOSCOPY     VIDEO BRONCHOSCOPY WITH ENDOBRONCHIAL ULTRASOUND N/A 12/17/2022   Procedure: VIDEO BRONCHOSCOPY WITH ENDOBRONCHIAL ULTRASOUND;  Surgeon: Vida Rigger, MD;  Location: [name];  Service: Thoracic;  Laterality: N/A;    SOCIAL HISTORY: Social History   Socioeconomic History   Marital status: Divorced    Spouse name: Not on file   Number of children: Not on file   Years of education: Not on file   Highest education level: Not  on file  Occupational History   Not on file  Tobacco Use   Smoking status: Former    Packs/day: 1.50    Years: 35.00    Additional pack years: 0.00    Total pack years: 52.50    Types: Cigarettes    Quit date: 06/22/2015    Years since quitting: 7.5    Passive exposure: Past   Smokeless tobacco: Never  Vaping Use   Vaping Use: Never used  Substance and Sexual Activity   Alcohol use: Not Currently    Comment: weekends   Drug use: No   Sexual activity: Not Currently  Other Topics Concern   Not on file  Social History Narrative   Lives with son [name].   Social Determinants of Health   Financial Resource Strain: Not on file  Food Insecurity: Not on file  Transportation Needs: Not on file  Physical Activity: Not on file  Stress: Not on file  Social Connections: Not on file  Intimate Partner Violence: Not on file    FAMILY HISTORY: Family History  Problem Relation Age of Onset   Hypertension Mother    Breast cancer Mother    Heart attack Mother    Lung cancer Father    Heart disease Sister    Diabetes Sister    Colon cancer Sister     ALLERGIES:  is allergic to penicillins.  MEDICATIONS:  Current Outpatient Medications  Medication Sig Dispense Refill   acetaminophen (TYLENOL) 500 MG tablet Take 500 mg by mouth as needed.      albuterol (VENTOLIN HFA) 108 (90 Base) MCG/ACT inhaler Inhale 2 puffs into the lungs every 4 (four) hours as needed.     amLODipine (NORVASC) 10 MG tablet Take 10 mg by mouth every morning.     ascorbic acid (VITAMIN C) 1000 MG tablet Take 1,000 mg by mouth daily.     aspirin 81 MG tablet Take 81 mg by mouth daily.     carvedilol (COREG) 25 MG tablet Take 25 mg by mouth 2 (two) times daily with a meal.     cetirizine (ZYRTEC) 10 MG tablet Take 1 tablet by mouth as needed.     cholecalciferol (VITAMIN D3) 25 MCG (1000 UNIT) tablet Take 1,000 Units by mouth daily.     dexamethasone (DECADRON) 4 MG tablet Take 2 tablets (8mg ) by mouth daily starting the day after carboplatin for 3 days. Take with food 30 tablet 1   Fluticasone-Umeclidin-Vilant (TRELEGY ELLIPTA) 200-62.5-25 MCG/ACT AEPB Inhale 1 puff into the lungs every morning.     gabapentin (NEURONTIN) 300 MG capsule Take 300 mg by mouth at bedtime as needed.      ibuprofen (ADVIL,MOTRIN) 600 MG tablet Take 600 mg by mouth every 6 (six) hours as needed.     ipratropium-albuterol (DUONEB) 0.5-2.5 (3) MG/3ML SOLN Take 3 mLs by nebulization every 6 (six) hours as needed.     lidocaine-prilocaine (EMLA) cream Apply to affected area once 30 g 3   lisinopril (PRINIVIL,ZESTRIL) 40 MG tablet Take 40 mg by mouth every morning.     LORazepam (ATIVAN) 0.5 MG tablet Take 1 tablet (0.5 mg total) by mouth every 12 (twelve) hours as needed for anxiety. 30 tablet 0   ondansetron (ZOFRAN) 8 MG tablet Take 1 tablet (8 mg total) by mouth every 8 (eight) hours as needed for nausea or vomiting. Start on the third day after carboplatin. 30 tablet 1   potassium chloride (KLOR-CON) 10 MEQ tablet Take 1 tablet (10  mEq total) by mouth daily. (Patient taking differently: Take 10 mEq by mouth every morning.) 90 tablet 1   pravastatin (PRAVACHOL) 20 MG tablet Take 20 mg by mouth at bedtime.     prochlorperazine (COMPAZINE) 10 MG tablet Take 1 tablet (10 mg total) by mouth  every 6 (six) hours as needed for nausea or vomiting. 30 tablet 1   senna-docusate (SENOKOT-S) 8.6-50 MG tablet Take 2 tablets by mouth daily. 60 tablet 2   sildenafil (VIAGRA) 50 MG tablet Take 1-2 tablets (50-100 mg total) by mouth daily as needed for erectile dysfunction. 30 tablet 6   spironolactone (ALDACTONE) 25 MG tablet Take by mouth.     tamsulosin (FLOMAX) 0.4 MG CAPS capsule Take 0.4 mg by mouth daily after breakfast.     torsemide (DEMADEX) 20 MG tablet Take 20 mg by mouth every morning.     Vibegron (GEMTESA) 75 MG TABS Take 1 tablet (75 mg total) by mouth daily. (Patient taking differently: Take 1 tablet by mouth every morning.) 30 tablet 0   No current facility-administered medications for this visit.   Facility-Administered Medications Ordered in Other Visits  Medication Dose Route Frequency Provider Last Rate Last Admin   sodium chloride flush (NS) 0.9 % injection 10 mL  10 mL Intravenous PRN [name], MD   10 mL at 03/18/18 0858     PHYSICAL EXAMINATION: ECOG PERFORMANCE STATUS: 0 - Asymptomatic Vitals:   01/09/23 1028 01/09/23 1055  BP: 103/77   Pulse: (!) 114   Resp: 20   Temp: 97.8 F (36.6 C)   SpO2: 93% 93%   Filed Weights   01/09/23 1028  Weight: 206 lb (93.4 kg)    Physical Exam Constitutional:      General: He is not in acute distress. [name]:     Head: Normocephalic and atraumatic.  Eyes:     General: No scleral icterus.    Pupils: Pupils are equal, round, and reactive to light.  Cardiovascular:     Rate and Rhythm: Normal rate and regular rhythm.     Heart sounds: Normal heart sounds.  Pulmonary:     Effort: Pulmonary effort is normal. No respiratory distress.     Comments: Decreased breath sound bilaterally Abdominal:     General: Bowel sounds are normal. There is no distension.     Palpations: Abdomen is soft.  Musculoskeletal:        General: No deformity. Normal range of motion.     Cervical back: Normal range of motion and neck supple.   Lymphadenopathy:     Cervical: No cervical adenopathy.  Skin:    General: Skin is warm and dry.     Findings: No erythema or rash.     Comments: Left supraclavicular fossa fullness, chronic.  Neurological:     Mental Status: He is alert and oriented to person, place, and time. Mental status is at baseline.     Cranial Nerves: No cranial nerve deficit.      LABORATORY DATA:  I have reviewed the data as listed    Latest Ref Rng & Units 01/08/2023   10:36 AM 01/01/2023    8:25 AM 12/17/2022    9:36 PM  CBC  WBC 4.0 - 10.5 K/uL 4.9  11.1  8.3   Hemoglobin 13.0 - 17.0 g/dL 53.6  64.4  03.4   Hematocrit 39.0 - 52.0 % 44.7  43.8  45.0   Platelets 150 - 400 K/uL 267  308  359  Latest Ref Rng & Units 01/08/2023   10:36 AM 01/01/2023    8:24 AM 12/17/2022    9:36 PM  CMP  Glucose 70 - 99 mg/dL 960  454    BUN 8 - 23 mg/dL 14  13    Creatinine 0.98 - 1.24 mg/dL 1.19  1.47  8.29   Sodium 135 - 145 mmol/L 136  137    Potassium 3.5 - 5.1 mmol/L 4.0  4.7    Chloride 98 - 111 mmol/L 99  99    CO2 22 - 32 mmol/L 29  30    Calcium 8.9 - 10.3 mg/dL 8.9  9.0    Total Protein 6.5 - 8.1 g/dL 6.8  6.9    Total Bilirubin 0.3 - 1.2 mg/dL 0.4  0.6    Alkaline Phos 38 - 126 U/L 59  59    AST 15 - 41 U/L 19  13    ALT 0 - 44 U/L 18  11       RADIOGRAPHIC STUDIES: I have personally reviewed the radiological images as listed and agreed with the findings in the report.. DG Chest 2 View  Result Date: 01/09/2023 CLINICAL DATA:  Shortness of breath. Primary lung adenocarcinoma, rate EXAM: CHEST - 2 VIEW COMPARISON:  Chest radiographs 12/28/2022 and 12/25/2022; CT 12/14/2022 FINDINGS: New right chest wall porta catheter with tip overlying the superior vena cava. The mid aspect of this catheter curls approximately 2.5 cm superiorly at the right neck base before turning inferiorly and extending into the superior vena cava. Interval increase in homogeneous opacity indicating pleural effusion involving the  inferior 60% of the right hemithorax compared to 12/28/2022, now more similar to 12/25/2022. There is again homogeneous opacification from right upper lobe collapse. There is again an approximate 2-3 cm faint density overlying the left midlung corresponding to the nodule better seen on prior CT within the superior segment of the left lower lobe on prior CT. No left pleural effusion. No pneumothorax. Visualized left side of the cardiac silhouette and mediastinal contours are unremarkable. Mild multilevel degenerative disc changes of the thoracic spine. IMPRESSION: 1. New right chest wall porta catheter with tip overlying the superior vena cava. The mid aspect of this catheter curls approximately 2.5 cm superiorly at the right neck base, likely into the right internal jugular vein before turning inferiorly and extending into the superior vena cava. 2. Interval increase in homogeneous opacity indicating moderate-to-large pleural effusion involving the inferior 60% of the right hemithorax compared to 12/28/2022, now more similar to 12/25/2022. There also may be right lower lobe collapse, as seen on 12/14/2022 CT. 3. There is again homogeneous opacification of the superomedial right hemithorax from right upper lobe collapse. 4. There is again an approximate 2-3 cm faint density overlying the left midlung corresponding to the nodule better seen on prior CT within the superior segment of the left lower lobe on prior CT. Electronically Signed   By: Neita Garnet M.D.   On: 01/09/2023 12:38   PERIPHERAL VASCULAR CATHETERIZATION  Result Date: 01/08/2023 See surgical note for result.  US THORACENTESIS ASP PLEURAL SPACE W/IMG GUIDE  Result Date: 12/28/2022 INDICATION: Right pleural effusion EXAM: ULTRASOUND GUIDED RIGHT THORACENTESIS MEDICATIONS: None. COMPLICATIONS: None immediate. PROCEDURE: An ultrasound guided thoracentesis was thoroughly discussed with the patient and questions answered. The benefits, risks,  alternatives and complications were also discussed. The patient understands and wishes to proceed with the procedure. Written consent was obtained. Ultrasound was performed to localize and mark an  adequate pocket of fluid in the right chest. The area was then prepped and draped in the normal sterile fashion. 1% Lidocaine was used for local anesthesia. Under ultrasound guidance a 19 gauge, 7-cm, Yueh catheter was introduced. Thoracentesis was performed. The catheter was removed and a dressing applied. FINDINGS: A total of approximately 2.0 L of thin serosanguineous fluid was removed. IMPRESSION: Successful ultrasound guided right thoracentesis yielding 2.0 L of serosanguineous pleural fluid. Electronically Signed   By: Olive Bass M.D.   On: 12/28/2022 15:30   DG Chest 2 View  Result Date: 12/28/2022 CLINICAL DATA:  Right pleural effusion EXAM: CHEST - 2 VIEW COMPARISON:  Previous studies including the examination of 12/18/2022 FINDINGS: There is a interval increase in size of large right pleural effusion. There is opacification of right mid and right lower lung fields. Homogeneous density in the medial right upper lung field corresponds to her case is seen right upper lobe. There is faint 2.1 cm nodular density in left parahilar region corresponding to the lung nodule in left lower lobe seen in previous CT. Scarring is seen in the left apex. IMPRESSION: There is large right pleural effusion with interval increase. Homogeneous opacity in the medial right upper lung field corresponds to atelectasis seen in previous CT. There is 2.1 cm nodular density in the left mid lung field corresponding to a nodular density in left lower lobe seen in previous CT. Electronically Signed   By: Ernie Avena M.D.   On: 12/28/2022 14:49   DG Chest Port 1 View  Result Date: 12/28/2022 CLINICAL DATA:  Right pleural effusion, status post thoracentesis EXAM: PORTABLE CHEST 1 VIEW COMPARISON:  Previous studies including the  examination of 12/25/2022 FINDINGS: Transverse diameter of heart is increased. There is homogeneous opacity in the medial right upper lung field consistent with previously described atelectasis in right upper lobe. There is decreasing large right pleural effusion. There is no pneumothorax. Left lung is clear. Evaluation of right lower lung field is limited by the effusion. IMPRESSION: There is decrease in amount of right pleural effusion after thoracentesis. There is no pneumothorax. Electronically Signed   By: Ernie Avena M.D.   On: 12/28/2022 14:44   DG Chest Port 1 View  Result Date: 12/18/2022 CLINICAL DATA:  Pleural effusion. EXAM: PORTABLE CHEST 1 VIEW COMPARISON:  12/17/2022 FINDINGS: Stable asymmetric elevation right hemidiaphragm. Interval decrease in right pleural effusion. Bibasilar atelectasis again noted. Cardiopericardial silhouette is at upper limits of normal for size. No acute bony abnormality. Soft tissue fullness in the medial right apex is similar to prior, compatible with right upper lobe collapse. IMPRESSION: 1. Interval decrease in right pleural effusion. 2. Persistent right upper lobe collapse. Electronically Signed   By: Kennith Center M.D.   On: 12/18/2022 12:20   US THORACENTESIS ASP PLEURAL SPACE W/IMG GUIDE  Result Date: 12/17/2022 INDICATION: Patient with right lung cancer with recurrent pleural effusion. Interventional radiology asked to perform a therapeutic thoracentesis. EXAM: ULTRASOUND GUIDED THORACENTESIS MEDICATIONS: 1% lidocaine 10 mL COMPLICATIONS: None immediate. PROCEDURE: An ultrasound guided thoracentesis was thoroughly discussed with the patient and questions answered. The benefits, risks, alternatives and complications were also discussed. The patient understands and wishes to proceed with the procedure. Written consent was obtained. Ultrasound was performed to localize and mark an adequate pocket of fluid in the right chest. The area was then prepped and  draped in the normal sterile fashion. 1% Lidocaine was used for local anesthesia. Under ultrasound guidance a 6 Fr Safe-T-Centesis catheter was  introduced. Thoracentesis was performed. The catheter was removed and a dressing applied. FINDINGS: A total of approximately 1.5 L of amber colored fluid was removed. IMPRESSION: Successful ultrasound guided right thoracentesis yielding 1.5 L of pleural fluid. Procedure performed by Alwyn Ren NP and supervised by Dr. Archer Asa. Electronically Signed   By: Malachy Moan M.D.   On: 12/17/2022 20:15   CT SUPER D CHEST WO MONARCH PILOT  Result Date: 12/17/2022 CLINICAL DATA:  Non-small cell lung cancer, ongoing immunotherapy, pleural metastatic disease * Tracking Code: BO * EXAM: CT CHEST WITHOUT CONTRAST TECHNIQUE: Multidetector CT imaging of the chest was performed using thin slice collimation for electromagnetic bronchoscopy planning purposes, without intravenous contrast. RADIATION DOSE REDUCTION: This exam was performed according to the departmental dose-optimization program which includes automated exposure control, adjustment of the mA and/or kV according to patient size and/or use of iterative reconstruction technique. COMPARISON:  Multiple exams, including PET-CT 12/03/2022 FINDINGS: Cardiovascular: Coronary, aortic arch, and branch vessel atherosclerotic vascular disease. Trace pericardial effusion. Mediastinum/Nodes: Mediastinal adenopathy was recently hypermetabolic with representative nodes including a 1.1 cm AP window lymph node on image 58 of series 2 and a 1.6 cm subcarinal lymph node on image 72 series 2. Lungs/Pleura: Very large right pleural effusion with only a small amount of aerated right middle lobe and complete atelectasis of the right lower lobe and right upper lobe. Extensive rind like density along the pleural is irregular and compatible with pleural metastatic disease. There is also nodular infiltration of the pleural adipose tissue at  the right anterior lung base adjacent to the pericardium compatible with tumor infiltration. The patient has a known right hilar and infrahilar mass which is obscured by the surrounding atelectasis. Severe emphysema. A pleural-based nodule in the left lower lobe superior segment measures 2.2 by 2.4 cm on image 79 of series 4 and was hypermetabolic on the 12/03/2022 PET-CT compatible with a contralateral metastatic lesion. Upper Abdomen: No adrenal mass. Scattered colonic diverticula. Abdominal aortic atherosclerosis. Musculoskeletal: Probable hemangioma at the L2 vertebral level. Not previously hypermetabolic. IMPRESSION: 1. Very large right pleural effusion with only a small amount of aerated right middle lobe and complete atelectasis of the right lower lobe and right upper lobe. 2. Extensive rind like density along the pleural is irregular and compatible with pleural metastatic disease. There is also nodular infiltration of the pleural adipose tissue at the right anterior lung base adjacent to the pericardium compatible with tumor infiltration. 3. The patient has a known right hilar and infrahilar mass which is obscured by the surrounding atelectasis. 4. Mediastinal adenopathy was recently hypermetabolic on the 12/03/2022 PET-CT compatible with metastatic disease. 5. A pleural-based nodule in the left lower lobe superior segment measures 2.2 by 2.4 cm and was hypermetabolic on the 12/03/2022 PET-CT compatible with a contralateral metastatic lesion. 6. Severe emphysema. 7. Coronary, aortic arch, and branch vessel atherosclerotic vascular disease. 8. Trace pericardial effusion. 9. Probable hemangioma at the L2 vertebral level. Aortic Atherosclerosis (ICD10-I70.0) and Emphysema (ICD10-J43.9). Electronically Signed   By: Gaylyn Rong M.D.   On: 12/17/2022 18:06   DG Chest Port 1 View  Result Date: 12/17/2022 CLINICAL DATA:  Status post bronchoscopy EXAM: PORTABLE CHEST 1 VIEW COMPARISON:  11/20/2022  FINDINGS: There is only a miniscule amount of aerated lung in the right mid upper chest, otherwise the right hemithorax is completely opacified. Indistinct pulmonary vasculature on the left possibly from pulmonary venous hypertension. No blunting of the left costophrenic angle. No visible pneumothorax. IMPRESSION: 1. Near complete  opacification of the right hemithorax, with only a miniscule amount of aerated lung in the right mid upper chest. 2. Indistinct pulmonary vasculature on the left possibly from pulmonary venous hypertension. Electronically Signed   By: Gaylyn Rong M.D.   On: 12/17/2022 17:56   DG Chest Port 1 View  Result Date: 12/17/2022 CLINICAL DATA:  Status post thoracentesis EXAM: PORTABLE CHEST 1 VIEW COMPARISON:  Chest x-ray dated June 17th 2020 FINDINGS: Visualized cardiac and mediastinal contours are unchanged. Moderate right pleural effusion, decreased in size when compared with the prior exam. Lung predominant heterogeneous opacities, likely atelectasis. Persistent right upper lobe collapse. No evidence of pneumothorax. IMPRESSION: 1. Moderate right pleural effusion, decreased in size. 2. No evidence of pneumothorax. 3. Persistent right upper lobe collapse. Electronically Signed   By: Allegra Lai M.D.   On: 12/17/2022 16:42   MR Brain W Wo Contrast  Result Date: 12/16/2022 CLINICAL DATA:  Lung cancer, evaluate for metastatic disease EXAM: MRI HEAD WITHOUT AND WITH CONTRAST TECHNIQUE: Multiplanar, multiecho pulse sequences of the brain and surrounding structures were obtained without and with intravenous contrast. CONTRAST:  10mL GADAVIST GADOBUTROL 1 MMOL/ML IV SOLN COMPARISON:  08/25/2019 FINDINGS: Brain: No restricted diffusion to suggest acute or subacute infarct. No abnormal parenchymal or meningeal enhancement. No acute hemorrhage, mass, mass effect, or midline shift. No hydrocephalus or extra-axial collection. Partial empty sella. Craniocervical junction. No hemosiderin  deposition to suggest remote hemorrhage. Normal cerebral volume for age. Scattered T2 hyperintense signal in the periventricular white matter, likely the sequela of mild chronic small vessel ischemic disease. Vascular: Normal arterial flow voids. Skull and upper cervical spine: Normal marrow signal. Sinuses/Orbits: Clear paranasal sinuses. No acute finding in the orbits. Status post left lens replacement. Other: The mastoid air cells are well aerated. IMPRESSION: No acute intracranial process. No evidence of metastatic disease in the brain. Electronically Signed   By: Wiliam Ke M.D.   On: 12/16/2022 03:30   NM PET Image Restag (PS) Skull Base To Thigh  Result Date: 12/03/2022 CLINICAL DATA:  Subsequent treatment strategy for non-small cell lung cancer. EXAM: NUCLEAR MEDICINE PET SKULL BASE TO THIGH TECHNIQUE: 12.1 mCi F-18 FDG was injected intravenously. Full-ring PET imaging was performed from the skull base to thigh after the radiotracer. CT data was obtained and used for attenuation correction and anatomic localization. Fasting blood glucose: 101 mg/dl COMPARISON:  PET-CT 16/04/9603 CT 11/16/2022 FINDINGS: Mediastinal blood pool activity: SUV max 2.1 Liver activity: SUV max NA NECK: No hypermetabolic lymph nodes in the neck. Incidental CT findings: None. CHEST: Rim intense hypermetabolic activity within the RIGHT hemithorax pleural space. This thin rim of metabolic 2 borders in large RIGHT pleural effusion. More nodular pleural thickening in the medial aspect of the RIGHT middle lobe with SUV max equal 8.5. Nodular thickening measuring up to 9 mm (image 70/4). Hypermetabolic thickening at the RIGHT hilum measures 3.5 cm surrounds the RIGHT upper lobe bronchi. Activity is intense with SUV max equal 8.5. There is postobstructive collapse of the RIGHT upper lobe. Large RIGHT effusion. Within the superior segment LEFT lower lobe 22 mm hypermetabolic nodule with SUV max equal 9.5. There is small bilateral  hypermetabolic mediastinal adenopathy and subcarinal adenopathy. Incidental CT findings: None. ABDOMEN/PELVIS: Limited view of the liver, kidneys, pancreas are unremarkable. Normal adrenal glands. Incidental CT findings: Adrenal glands normal. No metastatic adenopathy. No liver metastasis. SKELETON: No focal hypermetabolic activity to suggest skeletal metastasis. Several sclerotic lesions in the pelvis and spine without metabolic activity. Incidental CT  findings: None. IMPRESSION: 1. Hypermetabolic mass at the RIGHT hilum consistent with bronchogenic carcinoma. 2. Postobstructive collapse of the RIGHT upper lobe. 3. Uniform hypermetabolic pleural thickening in the RIGHT lung consistent with pleural metastasis. 4. Large RIGHT effusion. 5. Hypermetabolic nodule in the superior segment of the LEFT lower lobe consistent with pulmonary metastasis. Synchronous bronchogenic carcinoma versus metastatic lesion. 6. Hypermetabolic bilateral mediastinal adenopathy consistent with nodal metastasis. 7. No evidence of metastatic disease outside the thorax. 8. No skeletal metastasis. Electronically Signed   By: Genevive Bi M.D.   On: 12/03/2022 16:33   US THORACENTESIS ASP PLEURAL SPACE W/IMG GUIDE  Result Date: 11/20/2022 INDICATION: Right pleural effusion EXAM: ULTRASOUND GUIDED RIGHT THORACENTESIS MEDICATIONS: 7 cc 1% lidocaine COMPLICATIONS: None immediate. PROCEDURE: An ultrasound guided thoracentesis was thoroughly discussed with the patient and questions answered. The benefits, risks, alternatives and complications were also discussed. The patient understands and wishes to proceed with the procedure. Written consent was obtained. Ultrasound was performed to localize and mark an adequate pocket of fluid in the right chest. The area was then prepped and draped in the normal sterile fashion. 1% Lidocaine was used for local anesthesia. Under ultrasound guidance a catheter was introduced. Thoracentesis was performed. The  catheter was removed and a dressing applied. FINDINGS: A total of approximately 1 L of hazy yellow fluid was removed. Samples were sent to the laboratory as requested by the clinical team. IMPRESSION: Successful ultrasound guided right thoracentesis yielding 1 L of pleural fluid. Follow-up chest x-ray revealed no evidence of pneumothorax. Procedure performed by Mina Marble, PA-C Electronically Signed   By: Olive Bass M.D.   On: 11/20/2022 15:47   DG Chest Port 1 View  Result Date: 11/20/2022 CLINICAL DATA:  s/p thoracentesis, right EXAM: PORTABLE CHEST 1 VIEW COMPARISON:  Multiple prior CTs FINDINGS: There is right upper lobe collapse. The heart appears within normal limits. No significant pleural effusion appreciated status post right thoracentesis. No pneumothorax. The left lung is clear. No acute osseous abnormality. IMPRESSION: 1. No pneumothorax status post right thoracentesis. No significant residual pleural effusion. 2. Known right upper lobe collapse. Electronically Signed   By: Olive Bass M.D.   On: 11/20/2022 14:57   CT CHEST ABDOMEN PELVIS W CONTRAST  Result Date: 11/16/2022 CLINICAL DATA:  Lung cancer restaging, ongoing immunotherapy * Tracking Code: BO * EXAM: CT CHEST, ABDOMEN, AND PELVIS WITH CONTRAST TECHNIQUE: Multidetector CT imaging of the chest, abdomen and pelvis was performed following the standard protocol during bolus administration of intravenous contrast. RADIATION DOSE REDUCTION: This exam was performed according to the departmental dose-optimization program which includes automated exposure control, adjustment of the mA and/or kV according to patient size and/or use of iterative reconstruction technique. CONTRAST:  OMNIPAQUE IOHEXOL 300 MG/ML  SOLN COMPARISON:  CT chest, 05/16/2022, PET-CT, 02/05/2022 FINDINGS: CT CHEST FINDINGS Cardiovascular: Aortic atherosclerosis. Normal heart size. Scattered left coronary artery calcifications. No pericardial effusion.  Mediastinum/Nodes: Interval increase in circumferential soft tissue thickening about the right hilum (series 2, image 24). Slight interval increase in size in enlarged AP window lymph nodes, measuring up to 2.3 x 1.1 cm, previously 1.7 x 0.9 cm (series 2, image 42). Thyroid gland, trachea, and esophagus demonstrate no significant findings. Lungs/Pleura: New moderate right pleural effusion with diffuse, nodular, enhancing pleural thickening (series 2, image 29). Proximal obstruction of the right upper lobe segmental bronchi with complete atelectasis or consolidation of the right upper lobe, new compared to prior examination (series 2, image 19). Interval enlargement of a  subpleural mass of the dependent superior segment left lower lobe, measuring 2.4 x 2.1 cm, previously 1.8 x 1.2 cm (series 3, image 68). Severe underlying emphysema. Musculoskeletal: No chest wall abnormality. No acute osseous findings. CT ABDOMEN PELVIS FINDINGS Hepatobiliary: No solid liver abnormality is seen. No gallstones, gallbladder wall thickening, or biliary dilatation. Pancreas: Unremarkable. No pancreatic ductal dilatation or surrounding inflammatory changes. Spleen: Normal in size without significant abnormality. Adrenals/Urinary Tract: Adrenal glands are unremarkable. Kidneys are normal, without renal calculi, solid lesion, or hydronephrosis. Bladder is unremarkable. Stomach/Bowel: Stomach is within normal limits. Appendix appears normal. No evidence of bowel wall thickening, distention, or inflammatory changes. Pancolonic diverticulosis. Moderate burden of stool throughout the colon. Vascular/Lymphatic: Aortic atherosclerosis. No enlarged abdominal or pelvic lymph nodes. Reproductive: Prostatomegaly. Other: No abdominal wall hernia or abnormality. No ascites. Musculoskeletal: No acute osseous findings. Unchanged sclerotic, trabeculated vertebral body hemangiomata of T10 and L2 (series 6, image 100). No suspicious osseous lesions.  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 IMPRESSION: 1. New moderate right pleural effusion with diffuse, nodular, enhancing pleural thickening consistent with pleural metastatic disease. 2. Proximal obstruction of the right upper lobe segmental bronchi with complete atelectasis or consolidation of the right upper lobe, consistent with local malignant recurrence and postobstructive atelectasis of the upper lobe. 3. Interval increase in circumferential soft tissue thickening about the right hilum. Slight interval increase in size in enlarged AP window lymph nodes. 4. Interval enlargement of a subpleural mass of the dependent superior segment left lower lobe consistent with an enlarging pulmonary metastasis or metachronous primary lung malignancy. 5. Emphysema. 6. Coronary artery disease. 7. Prostatomegaly. These results will be called to the ordering clinician or representative by the Radiologist Assistant, and communication documented in the PACS or [employer]. Aortic Atherosclerosis (ICD10-I70.0) and Emphysema (ICD10-J43.9). Electronically Signed   By: Jearld Lesch M.D.   On: 11/16/2022 17:13

## 2023-01-09 NOTE — Assessment & Plan Note (Addendum)
Palliative therapeutic thoracentesis as needed.  Expect to improve with chemotherapy treatment.  Consider pleural catheter if refractory to treatment.  Today's xray showed increased of pleural effusion. He is symptomatic, will arrange thoracentesis.

## 2023-01-09 NOTE — Assessment & Plan Note (Signed)
Recommend low-dose lorazepam 0.5 mg every 12 hours as needed for anxiety 

## 2023-01-09 NOTE — Telephone Encounter (Signed)
Per Dr. Cathie Hoops: his pleural effusion increased again. please arrange to get thoracentesis of right side.

## 2023-01-09 NOTE — Assessment & Plan Note (Signed)
Refer to nutritionist 

## 2023-01-10 ENCOUNTER — Encounter: Payer: Self-pay | Admitting: Oncology

## 2023-01-10 ENCOUNTER — Ambulatory Visit
Admission: RE | Admit: 2023-01-10 | Discharge: 2023-01-10 | Disposition: A | Discharge status: Home / Self Care | Payer: Medicare Other | Source: Ambulatory Visit | Attending: Radiation Oncology | Admitting: Radiation Oncology

## 2023-01-10 ENCOUNTER — Ambulatory Visit
Admission: RE | Admit: 2023-01-10 | Discharge: 2023-01-10 | Disposition: A | Discharge status: Home / Self Care | Payer: Medicare Other | Source: Ambulatory Visit | Attending: Oncology | Admitting: Oncology

## 2023-01-10 ENCOUNTER — Other Ambulatory Visit: Payer: Self-pay | Admitting: Radiology

## 2023-01-10 ENCOUNTER — Other Ambulatory Visit: Payer: Self-pay

## 2023-01-10 ENCOUNTER — Ambulatory Visit
Admission: RE | Admit: 2023-01-10 | Discharge: 2023-01-10 | Disposition: A | Discharge status: Home / Self Care | Payer: Medicare Other | Source: Ambulatory Visit | Attending: Radiology | Admitting: Radiology

## 2023-01-10 DIAGNOSIS — J90 Pleural effusion, not elsewhere classified: Secondary | ICD-10-CM | POA: Insufficient documentation

## 2023-01-10 DIAGNOSIS — I4892 Unspecified atrial flutter: Secondary | ICD-10-CM | POA: Insufficient documentation

## 2023-01-10 DIAGNOSIS — Z510 Encounter for antineoplastic radiation therapy: Secondary | ICD-10-CM | POA: Diagnosis not present

## 2023-01-10 LAB — RAD ONC ARIA SESSION SUMMARY
Course Elapsed Days: 16
Plan Fractions Treated to Date: 12
Plan Prescribed Dose Per Fraction: 2 Gy
Plan Total Fractions Prescribed: 20
Plan Total Prescribed Dose: 40 Gy
Reference Point Dosage Given to Date: 24 Gy
Reference Point Session Dosage Given: 2 Gy
Session Number: 12

## 2023-01-10 MED ORDER — LIDOCAINE HCL (PF) 1 % IJ SOLN
10.0000 mL | Freq: Once | INTRAMUSCULAR | Status: AC
  Administered 2023-01-10: 10 mL via INTRADERMAL
  Filled 2023-01-10: qty 10

## 2023-01-10 NOTE — Telephone Encounter (Signed)
Pt scheduled for thora today at 2:30. Pt aware of appt

## 2023-01-10 NOTE — Procedures (Addendum)
PROCEDURE SUMMARY:  Successful US guided right thoracentesis. Yielded 2.0 L of serosanguineous fluid. Pt tolerated procedure well. No immediate complications.  Specimen was not sent for labs. CXR ordered.  EBL < 5 mL  Cloretta Ned 01/10/2023 3:40 PM  CXR reviewed by Dr. Renette Butters, no PTX following the procedure and patient was discharged home.

## 2023-01-11 ENCOUNTER — Ambulatory Visit: Payer: Medicare Other

## 2023-01-14 ENCOUNTER — Other Ambulatory Visit: Payer: Self-pay

## 2023-01-14 ENCOUNTER — Ambulatory Visit: Payer: Medicare Other

## 2023-01-14 ENCOUNTER — Ambulatory Visit: Admission: RE | Admit: 2023-01-14 | Payer: Medicare Other | Source: Ambulatory Visit

## 2023-01-14 DIAGNOSIS — Z510 Encounter for antineoplastic radiation therapy: Secondary | ICD-10-CM | POA: Diagnosis not present

## 2023-01-14 LAB — RAD ONC ARIA SESSION SUMMARY
Course Elapsed Days: 20
Plan Fractions Treated to Date: 13
Plan Prescribed Dose Per Fraction: 2 Gy
Plan Total Fractions Prescribed: 20
Plan Total Prescribed Dose: 40 Gy
Reference Point Dosage Given to Date: 26 Gy
Reference Point Session Dosage Given: 2 Gy
Session Number: 13

## 2023-01-15 ENCOUNTER — Inpatient Hospital Stay: Payer: Medicare Other

## 2023-01-15 ENCOUNTER — Ambulatory Visit
Admission: RE | Admit: 2023-01-15 | Discharge: 2023-01-15 | Disposition: A | Discharge status: Home / Self Care | Payer: Medicare Other | Source: Ambulatory Visit | Attending: Radiation Oncology | Admitting: Radiation Oncology

## 2023-01-15 ENCOUNTER — Other Ambulatory Visit: Payer: Medicare Other

## 2023-01-15 ENCOUNTER — Other Ambulatory Visit: Payer: Self-pay

## 2023-01-15 ENCOUNTER — Ambulatory Visit: Payer: Medicare Other

## 2023-01-15 DIAGNOSIS — Z510 Encounter for antineoplastic radiation therapy: Secondary | ICD-10-CM | POA: Diagnosis not present

## 2023-01-15 LAB — RAD ONC ARIA SESSION SUMMARY
Course Elapsed Days: 21
Plan Fractions Treated to Date: 14
Plan Prescribed Dose Per Fraction: 2 Gy
Plan Total Fractions Prescribed: 20
Plan Total Prescribed Dose: 40 Gy
Reference Point Dosage Given to Date: 28 Gy
Reference Point Session Dosage Given: 2 Gy
Session Number: 14

## 2023-01-15 NOTE — Progress Notes (Signed)
Nutrition Assessment:  72 year old male with recurrent lung cancer.  Receiving chemotherapy and radiation.   Met with patient following radiation.  Patient reports that his appetite has improved in the last 2 weeks.  Prior to the last few weeks appetite has been decreased since April.  Does not eat much breakfast.  Lunch is usually a sandwich.  Dinner is meat and couple of side.  He lives with his son and mostly gets food out. Does not cook.  Denies nausea.  Takes medication for constipation.  Drinking mostly water and apple juice   Medications: reviewed  Labs: reviewed  Anthropometrics:   Weight 206 lb on 7/10 216 lb on 6/7 217 lb on 5/14 225 lb on 11/14  8% weight loss in the last 8 months, concerning  NUTRITION DIAGNOSIS: Unintentional weight loss related to cancer as evidenced by 8% weight loss in the last 8 months   INTERVENTION:  Encouraged high calorie, high protein foods.  Encouraged adding oral nutrition supplement for additional calories with weight loss.  Contact information provided    MONITORING, EVALUATION, GOAL: weight trends, intake   NEXT VISIT: Tuesday, August 6 phone follow-up  Jawara Latorre B. Freida Busman, RD, LDN Registered Dietitian [phone]

## 2023-01-16 ENCOUNTER — Ambulatory Visit: Payer: Medicare Other

## 2023-01-16 ENCOUNTER — Encounter: Payer: Self-pay | Admitting: Oncology

## 2023-01-16 ENCOUNTER — Other Ambulatory Visit: Payer: Self-pay

## 2023-01-16 ENCOUNTER — Inpatient Hospital Stay: Payer: Medicare Other

## 2023-01-16 ENCOUNTER — Inpatient Hospital Stay: Payer: Medicare Other | Admitting: Medical Oncology

## 2023-01-16 ENCOUNTER — Encounter: Payer: Self-pay | Admitting: Medical Oncology

## 2023-01-16 ENCOUNTER — Ambulatory Visit
Admission: RE | Admit: 2023-01-16 | Discharge: 2023-01-16 | Disposition: A | Discharge status: Home / Self Care | Payer: Medicare Other | Source: Ambulatory Visit | Attending: Radiation Oncology | Admitting: Radiation Oncology

## 2023-01-16 VITALS — BP 109/80 | HR 141 | Temp 97.3°F | Wt 208.0 lb

## 2023-01-16 DIAGNOSIS — C3491 Malignant neoplasm of unspecified part of right bronchus or lung: Secondary | ICD-10-CM

## 2023-01-16 DIAGNOSIS — R000 Tachycardia, unspecified: Secondary | ICD-10-CM

## 2023-01-16 DIAGNOSIS — Z95828 Presence of other vascular implants and grafts: Secondary | ICD-10-CM | POA: Diagnosis not present

## 2023-01-16 DIAGNOSIS — Z510 Encounter for antineoplastic radiation therapy: Secondary | ICD-10-CM | POA: Diagnosis not present

## 2023-01-16 LAB — CBC WITH DIFFERENTIAL (CANCER CENTER ONLY)
Abs Immature Granulocytes: 0.04 10*3/uL (ref 0.00–0.07)
Basophils Absolute: 0 10*3/uL (ref 0.0–0.1)
Basophils Relative: 1 %
Eosinophils Absolute: 0 10*3/uL (ref 0.0–0.5)
Eosinophils Relative: 0 %
HCT: 43.4 % (ref 39.0–52.0)
Hemoglobin: 14.3 g/dL (ref 13.0–17.0)
Immature Granulocytes: 1 %
Lymphocytes Relative: 4 %
Lymphs Abs: 0.2 10*3/uL — ABNORMAL LOW (ref 0.7–4.0)
MCH: 27.9 pg (ref 26.0–34.0)
MCHC: 32.9 g/dL (ref 30.0–36.0)
MCV: 84.6 fL (ref 80.0–100.0)
Monocytes Absolute: 0.4 10*3/uL (ref 0.1–1.0)
Monocytes Relative: 10 %
Neutro Abs: 3.7 10*3/uL (ref 1.7–7.7)
Neutrophils Relative %: 84 %
Platelet Count: 282 10*3/uL (ref 150–400)
RBC: 5.13 MIL/uL (ref 4.22–5.81)
RDW: 13.5 % (ref 11.5–15.5)
WBC Count: 4.4 10*3/uL (ref 4.0–10.5)
nRBC: 0 % (ref 0.0–0.2)

## 2023-01-16 LAB — CMP (CANCER CENTER ONLY)
ALT: 31 U/L (ref 0–44)
AST: 21 U/L (ref 15–41)
Albumin: 3.3 g/dL — ABNORMAL LOW (ref 3.5–5.0)
Alkaline Phosphatase: 58 U/L (ref 38–126)
Anion gap: 8 (ref 5–15)
BUN: 19 mg/dL (ref 8–23)
CO2: 31 mmol/L (ref 22–32)
Calcium: 8.5 mg/dL — ABNORMAL LOW (ref 8.9–10.3)
Chloride: 96 mmol/L — ABNORMAL LOW (ref 98–111)
Creatinine: 0.98 mg/dL (ref 0.61–1.24)
GFR, Estimated: >60 mL/min (ref >60)
Glucose, Bld: 140 mg/dL — ABNORMAL HIGH (ref 70–99)
Potassium: 4.1 mmol/L (ref 3.5–5.1)
Sodium: 135 mmol/L (ref 135–145)
Total Bilirubin: 0.2 mg/dL — ABNORMAL LOW (ref 0.3–1.2)
Total Protein: 6.3 g/dL — ABNORMAL LOW (ref 6.5–8.1)

## 2023-01-16 LAB — RAD ONC ARIA SESSION SUMMARY
Course Elapsed Days: 22
Plan Fractions Treated to Date: 15
Plan Prescribed Dose Per Fraction: 2 Gy
Plan Total Fractions Prescribed: 20
Plan Total Prescribed Dose: 40 Gy
Reference Point Dosage Given to Date: 30 Gy
Reference Point Session Dosage Given: 2 Gy
Session Number: 15

## 2023-01-16 NOTE — Progress Notes (Addendum)
Hematology and Oncology Follow Up Visit  Mark Mccarty [id number] [birth year] 72 y.o. 01/16/2023  Past Medical History:  Diagnosis Date   Abnormal nuclear stress test    Anxiety    a.) on BZO (lorazepam) PRN   Asthma    Avascular necrosis of femoral head (HCC)    BPH (benign prostatic hyperplasia)    Cataract    a.) s/p extraction on LEFT   Colon adenomas    COPD (chronic obstructive pulmonary disease) (HCC)    Coronary artery disease 11/11/2014   a.) MPI 11/11/2014: EF 56%, mild inf wall ischemia; b.) LHC 12/16/2014: 20% mRCA - med mgmt   DDD (degenerative disc disease), lumbar    Diverticulosis    DM type 2 (diabetes mellitus, type 2) (HCC)    Duodenitis    Dysphagia    Dyspnea    ED (erectile dysfunction)    a.) on PDE5i (sildenafil) PRN   Elevated PSA    Former smoker    Gastritis    GERD (gastroesophageal reflux disease)    [name]    Hilar mass    History of hiatal hernia    Hyperlipidemia    Hypertension    Hyperuricemia    Hypokalemia    Left ventricular diastolic dysfunction 10/04/2016   a.) TTE 10/04/2016: EF 50%, mild MR/TR/PR, mod AR, G1DD; b.) TTE 10/10/2017: EF 45%, triv PR, mild AR/MR/TR, G1DD; c.) TTE 03/30/2020: EF 50%, mild-mod AR, mild MR/TR/PR; d.) TTE 04/23/2022: EF >55%, mild LVH, mild LAE, triv TR, mild PR, mod AR/MR   Liver lesion    Obesity    Pedal edema    Peripheral polyneuropathy    Peyronie's disease 05/2022   Pleural effusion    Primary lung adenocarcinoma, right (HCC)    a.) stage IIIA (cT3, cN1, cM0) --> s/p concurrent chemoradiation 2019 --> carboplatin + pacilitaxel (12/31/2017 - 02/19/2018) followed by 1 year (03/24/2018 - 03/17/2019) adjuvant anti-PDL-1 mAB immunotherapy (durvalumab)   [name] (peptic ulcer disease)    Renal insufficiency    Umbilical hernia    [name]     Principle Diagnosis:  Recurrent stage IV lung adenocarcinoma   Current Therapy:   Carboplatin/Taxol/bevacizumab - s/p  cycle 1 on 01/01/2023 Palliative XRT s/p weekly treatment 14 of 20 PriorTherapy:   History of stage IIIA cT3 N1 M0 lung adenocarcinoma, s/p concurrent chemoradiation [Aug 2019]. Finished 1 year of durvalumab treatments, Presumed left upper lobe lung cancer status post SBRT in October 2022   Interim History:  Mark Mccarty is back for follow-up   Since his last visit to our office has has been seen by cardiology for his atrial flutter with RVR. He is currently wearing a 7 day holter monitor. At this visit he was started on Eliquis 5 mg BID. He was asked to hold his amlodipine and was started on metoprolol 50 mg BID. He also had a thoracentesis this same day which yielded 2.0L of fluid.   Today he reports that he is feeling ok overall. He denies any chest pain, dizziness. SOB is chronic and stable. He denies any fevers. He does mention that his port-a-cath looks "funny". Port was placed on 01/08/2023. Port has not been used in clinic. He was here to get his port flushed for the first time here today. Due to concerns this was not accessed and he had peripheral labs obtained instead. He reports that this area is not painful. He has not tried anything for it.  Wt Readings from Last 3 Encounters:  01/16/23 208 lb (94.3 kg)  01/09/23 206 lb (93.4 kg)  01/08/23 205 lb (93 kg)     Medications:   Current Outpatient Medications:    acetaminophen (TYLENOL) 500 MG tablet, Take 500 mg by mouth as needed., Disp: , Rfl:    albuterol (VENTOLIN HFA) 108 (90 Base) MCG/ACT inhaler, Inhale 2 puffs into the lungs every 4 (four) hours as needed., Disp: , Rfl:    amLODipine (NORVASC) 10 MG tablet, Take 10 mg by mouth every morning., Disp: , Rfl:    ascorbic acid (VITAMIN C) 1000 MG tablet, Take 1,000 mg by mouth daily., Disp: , Rfl:    aspirin 81 MG tablet, Take 81 mg by mouth daily., Disp: , Rfl:    carvedilol (COREG) 25 MG tablet, Take 25 mg by mouth 2 (two) times daily with a meal., Disp: , Rfl:     cetirizine (ZYRTEC) 10 MG tablet, Take 1 tablet by mouth as needed., Disp: , Rfl:    cholecalciferol (VITAMIN D3) 25 MCG (1000 UNIT) tablet, Take 1,000 Units by mouth daily., Disp: , Rfl:    dexamethasone (DECADRON) 4 MG tablet, Take 2 tablets (8mg ) by mouth daily starting the day after carboplatin for 3 days. Take with food, Disp: 30 tablet, Rfl: 1   Fluticasone-Umeclidin-Vilant (TRELEGY ELLIPTA) 200-62.5-25 MCG/ACT AEPB, Inhale 1 puff into the lungs every morning., Disp: , Rfl:    gabapentin (NEURONTIN) 300 MG capsule, Take 300 mg by mouth at bedtime as needed. , Disp: , Rfl:    ibuprofen (ADVIL,MOTRIN) 600 MG tablet, Take 600 mg by mouth every 6 (six) hours as needed., Disp: , Rfl:    ipratropium-albuterol (DUONEB) 0.5-2.5 (3) MG/3ML SOLN, Take 3 mLs by nebulization every 6 (six) hours as needed., Disp: , Rfl:    lidocaine-prilocaine (EMLA) cream, Apply to affected area once, Disp: 30 g, Rfl: 3   lisinopril (PRINIVIL,ZESTRIL) 40 MG tablet, Take 40 mg by mouth every morning., Disp: , Rfl:    LORazepam (ATIVAN) 0.5 MG tablet, Take 1 tablet (0.5 mg total) by mouth every 12 (twelve) hours as needed for anxiety., Disp: 30 tablet, Rfl: 0   ondansetron (ZOFRAN) 8 MG tablet, Take 1 tablet (8 mg total) by mouth every 8 (eight) hours as needed for nausea or vomiting. Start on the third day after carboplatin., Disp: 30 tablet, Rfl: 1   potassium chloride (KLOR-CON) 10 MEQ tablet, Take 1 tablet (10 mEq total) by mouth daily. (Patient taking differently: Take 10 mEq by mouth every morning.), Disp: 90 tablet, Rfl: 1   pravastatin (PRAVACHOL) 20 MG tablet, Take 20 mg by mouth at bedtime., Disp: , Rfl:    prochlorperazine (COMPAZINE) 10 MG tablet, Take 1 tablet (10 mg total) by mouth every 6 (six) hours as needed for nausea or vomiting., Disp: 30 tablet, Rfl: 1   senna-docusate (SENOKOT-S) 8.6-50 MG tablet, Take 2 tablets by mouth daily., Disp: 60 tablet, Rfl: 2   sildenafil (VIAGRA) 50 MG tablet, Take 1-2 tablets  (50-100 mg total) by mouth daily as needed for erectile dysfunction., Disp: 30 tablet, Rfl: 6   spironolactone (ALDACTONE) 25 MG tablet, Take by mouth., Disp: , Rfl:    tamsulosin (FLOMAX) 0.4 MG CAPS capsule, Take 0.4 mg by mouth daily after breakfast., Disp: , Rfl:    torsemide (DEMADEX) 20 MG tablet, Take 20 mg by mouth every morning., Disp: , Rfl:    Vibegron (GEMTESA) 75 MG TABS, Take 1 tablet (75 mg total) by mouth  daily. (Patient taking differently: Take 1 tablet by mouth every morning.), Disp: 30 tablet, Rfl: 0 No current facility-administered medications for this visit.  Facility-Administered Medications Ordered in Other Visits:    sodium chloride flush (NS) 0.9 % injection 10 mL, 10 mL, Intravenous, PRN, [name], MD, 10 mL at 03/18/18 0858  Allergies:  Allergies  Allergen Reactions   Penicillins Rash    Stomach hurt Has patient had a PCN reaction causing immediate rash, facial/tongue/throat swelling, SOB or lightheadedness with hypotension: yes Has patient had a PCN reaction causing severe rash involving mucus membranes or skin necrosis: no Has patient had a PCN reaction that required hospitalization: no Has patient had a PCN reaction occurring within the last 10 years: yes If all of the above answers are "NO", then may proceed with Cephalosporin use.     Past Medical History, Surgical history, Social history, and Family History were reviewed and updated.  Review of Systems: Review of Systems  Constitutional:  Negative for fever and unexpected weight change.  Respiratory:  Positive for shortness of breath. Negative for chest tightness.   Cardiovascular:  Negative for chest pain, leg swelling and palpitations.  Gastrointestinal:  Negative for constipation, diarrhea, nausea and vomiting.  Skin:  Positive for wound.     Physical Exam:  weight is 208 lb (94.3 kg). His tympanic temperature is 97.3 F (36.3 C) (abnormal). His blood pressure is 109/80 and his pulse is 141  (abnormal). His oxygen saturation is 97%.   Physical Exam Vitals and nursing note reviewed.  Constitutional:      General: He is not in acute distress.    Appearance: Normal appearance. He is not ill-appearing, toxic-appearing or diaphoretic.  [name]:     Head: Normocephalic and atraumatic.  Cardiovascular:     Rate and Rhythm: Regular rhythm. Tachycardia present.  Pulmonary:     Effort: Pulmonary effort is normal.     Breath sounds: Normal breath sounds.  Abdominal:     Palpations: Abdomen is soft.  Musculoskeletal:     Right lower leg: No edema.     Left lower leg: No edema.  Skin:    General: Skin is warm.     Coloration: Skin is not pale.     Comments: See below  Neurological:     Mental Status: He is alert.       Lab Results  Component Value Date   WBC 4.4 01/16/2023   HGB 14.3 01/16/2023   HCT 43.4 01/16/2023   MCV 84.6 01/16/2023   PLT 282 01/16/2023     Chemistry      Component Value Date/Time   NA 135 01/16/2023 1016   NA 138 07/10/2014 1328   K 4.1 01/16/2023 1016   K 3.1 (L) 07/10/2014 1328   CL 96 (L) 01/16/2023 1016   CL 101 07/10/2014 1328   CO2 31 01/16/2023 1016   CO2 31 07/10/2014 1328   BUN 19 01/16/2023 1016   BUN 14 07/10/2014 1328   CREATININE 0.98 01/16/2023 1016   CREATININE 1.16 07/10/2014 1328      Component Value Date/Time   CALCIUM 8.5 (L) 01/16/2023 1016   CALCIUM 8.9 07/10/2014 1328   ALKPHOS 58 01/16/2023 1016   ALKPHOS 60 07/10/2014 1328   AST 21 01/16/2023 1016   ALT 31 01/16/2023 1016   ALT 19 07/10/2014 1328   BILITOT 0.2 (L) 01/16/2023 1016      Assessment and Plan- Patient is a 72 y.o. male    Encounter Diagnoses  Name Primary?   Primary lung adenocarcinoma, right (HCC) Yes   Port-A-Cath in place    Tachycardia     Today I am concerned about his tachycardia which is worsened despite metoprolol 50 mg BID. I have called his cardiologist's office and discussed my concerns. Patient feels and looks stable. They  have asked that he be seen this morning in office. Pt is agreeable.   I am also concerned about his port-a-cath; question rejection vs other. This port has not been accessed yet by our office as his first treatment was performed peripherally prior to his port-a-cath placement. We have contacted Dr. Wyn Quaker who placed his port-a-cath. Awaiting further recommendations.   Case and plan discussed with Dr. Cathie Hoops.   Update: Discussed with Dr. Wyn Quaker. Patient will be assessed in their office today.   Disposition: Going to cardiology now Dr. Wyn Quaker being contacted regarding his port-a-cath Keep follow up with our office as planned -[place]    Clent Jacks PA-C 7/17/202411:04 AM

## 2023-01-17 ENCOUNTER — Ambulatory Visit
Admission: RE | Admit: 2023-01-17 | Discharge: 2023-01-17 | Disposition: A | Discharge status: Home / Self Care | Payer: Medicare Other | Source: Home / Self Care | Attending: Internal Medicine | Admitting: Internal Medicine

## 2023-01-17 ENCOUNTER — Other Ambulatory Visit: Payer: Self-pay

## 2023-01-17 ENCOUNTER — Ambulatory Visit: Payer: Medicare Other | Admitting: Anesthesiology

## 2023-01-17 ENCOUNTER — Ambulatory Visit
Admission: RE | Admit: 2023-01-17 | Discharge: 2023-01-17 | Disposition: A | Discharge status: Home / Self Care | Payer: Medicare Other | Attending: Internal Medicine | Admitting: Internal Medicine

## 2023-01-17 ENCOUNTER — Ambulatory Visit
Admission: RE | Admit: 2023-01-17 | Discharge: 2023-01-17 | Disposition: A | Discharge status: Home / Self Care | Payer: Medicare Other | Source: Ambulatory Visit | Attending: Radiation Oncology | Admitting: Radiation Oncology

## 2023-01-17 ENCOUNTER — Encounter
Admission: RE | Disposition: A | Discharge status: Home / Self Care | Payer: Self-pay | Source: Home / Self Care | Attending: Internal Medicine

## 2023-01-17 DIAGNOSIS — I4892 Unspecified atrial flutter: Secondary | ICD-10-CM | POA: Diagnosis not present

## 2023-01-17 DIAGNOSIS — Z87891 Personal history of nicotine dependence: Secondary | ICD-10-CM | POA: Diagnosis not present

## 2023-01-17 DIAGNOSIS — I351 Nonrheumatic aortic (valve) insufficiency: Secondary | ICD-10-CM | POA: Diagnosis not present

## 2023-01-17 DIAGNOSIS — I10 Essential (primary) hypertension: Secondary | ICD-10-CM | POA: Diagnosis not present

## 2023-01-17 DIAGNOSIS — E119 Type 2 diabetes mellitus without complications: Secondary | ICD-10-CM | POA: Insufficient documentation

## 2023-01-17 DIAGNOSIS — Z79899 Other long term (current) drug therapy: Secondary | ICD-10-CM | POA: Diagnosis not present

## 2023-01-17 DIAGNOSIS — C3491 Malignant neoplasm of unspecified part of right bronchus or lung: Secondary | ICD-10-CM | POA: Insufficient documentation

## 2023-01-17 DIAGNOSIS — J449 Chronic obstructive pulmonary disease, unspecified: Secondary | ICD-10-CM | POA: Insufficient documentation

## 2023-01-17 DIAGNOSIS — M109 Gout, unspecified: Secondary | ICD-10-CM | POA: Diagnosis not present

## 2023-01-17 DIAGNOSIS — I4819 Other persistent atrial fibrillation: Secondary | ICD-10-CM

## 2023-01-17 DIAGNOSIS — E785 Hyperlipidemia, unspecified: Secondary | ICD-10-CM | POA: Insufficient documentation

## 2023-01-17 DIAGNOSIS — Z510 Encounter for antineoplastic radiation therapy: Secondary | ICD-10-CM | POA: Diagnosis not present

## 2023-01-17 HISTORY — PX: TEE WITHOUT CARDIOVERSION: SHX5443

## 2023-01-17 HISTORY — PX: CARDIOVERSION: SHX1299

## 2023-01-17 LAB — RAD ONC ARIA SESSION SUMMARY
Course Elapsed Days: 23
Plan Fractions Treated to Date: 16
Plan Prescribed Dose Per Fraction: 2 Gy
Plan Total Fractions Prescribed: 20
Plan Total Prescribed Dose: 40 Gy
Reference Point Dosage Given to Date: 32 Gy
Reference Point Session Dosage Given: 2 Gy
Session Number: 16

## 2023-01-17 LAB — ECHO TEE

## 2023-01-17 SURGERY — TRANSESOPHAGEAL ECHOCARDIOGRAM (TEE)
Anesthesia: General

## 2023-01-17 MED ORDER — PROPOFOL 10 MG/ML IV BOLUS
INTRAVENOUS | Status: DC | PRN
Start: 2023-01-17 — End: 2023-01-17
  Administered 2023-01-17: 50 mg via INTRAVENOUS
  Administered 2023-01-17 (×2): 20 mg via INTRAVENOUS

## 2023-01-17 MED ORDER — LIDOCAINE VISCOUS HCL 2 % MT SOLN
OROMUCOSAL | Status: AC
  Filled 2023-01-17: qty 15

## 2023-01-17 MED ORDER — BUTAMBEN-TETRACAINE-BENZOCAINE 2-2-14 % EX AERO
INHALATION_SPRAY | CUTANEOUS | Status: AC
  Filled 2023-01-17: qty 5

## 2023-01-17 MED ORDER — SODIUM CHLORIDE 0.9 % IV SOLN: INTRAVENOUS | Status: DC

## 2023-01-17 NOTE — Transfer of Care (Signed)
Immediate Anesthesia Transfer of Care Note  Patient: HERBERT MARKEN  Procedure(s) Performed: TRANSESOPHAGEAL ECHOCARDIOGRAM (TEE) CARDIOVERSION  Patient Location:  spu  Anesthesia Type:General  Level of Consciousness: awake and alert   Airway & Oxygen Therapy: Patient Spontanous Breathing and Patient connected to nasal cannula oxygen  Post-op Assessment: Report given to RN and Post -op Vital signs reviewed and stable  Post vital signs: Reviewed  Last Vitals:  Vitals Value Taken Time  BP 121/81 01/17/23 1215  Temp    Pulse 77 01/17/23 1216  Resp 23 01/17/23 1216  SpO2 94 % 01/17/23 1216  Vitals shown include unfiled device data.  Last Pain:  Vitals:   01/17/23 1125  TempSrc: Oral  PainSc: 0-No pain         Complications: No notable events documented.

## 2023-01-17 NOTE — Progress Notes (Signed)
*  PRELIMINARY RESULTS* Echocardiogram Echocardiogram Transesophageal has been performed.  Cristela Blue 01/17/2023, 12:25 PM

## 2023-01-17 NOTE — CV Procedure (Signed)
TEE: Under moderate sedation, TEE was performed without complications: LA: Normal. Left atrial appendage: Normal without thrombus. Normal function. Inter atrial septum is intact without defect. Double contrast study negative for atrial level shunting. No late appearance of bubbles either.  Direct current cardioversion 01/17/2023 12:27 PM  Indication symptomatic A. Fibrillation.  Procedure: Using IV Propofol for achieving deep sedation, synchronized direct current cardioversion performed. Patient was delivered with 120 Joules of electricity X 120 with success to NSR. Patient tolerated the procedure well. No immediate complication noted.    Stable for discharge home.   Clotilde Dieter, DO 01/17/2023, 12:27 PM Office: [phone]

## 2023-01-17 NOTE — Anesthesia Postprocedure Evaluation (Signed)
Anesthesia Post Note  Patient: Mark Mccarty  Procedure(s) Performed: TRANSESOPHAGEAL ECHOCARDIOGRAM (TEE) CARDIOVERSION  Patient location during evaluation: Specials Recovery Anesthesia Type: General Level of consciousness: awake and alert Pain management: pain level controlled Vital Signs Assessment: post-procedure vital signs reviewed and stable Respiratory status: spontaneous breathing, nonlabored ventilation, respiratory function stable and patient connected to nasal cannula oxygen Cardiovascular status: blood pressure returned to baseline and stable Postop Assessment: no apparent nausea or vomiting Anesthetic complications: no   No notable events documented.   Last Vitals:  Vitals:   01/17/23 1215 01/17/23 1220  BP: 121/81 122/82  Pulse: 78 77  Resp: (!) 37 (!) 24  Temp:    SpO2: 94% 94%    Last Pain:  Vitals:   01/17/23 1125  TempSrc: Oral  PainSc: 0-No pain                 Louie Boston

## 2023-01-17 NOTE — Anesthesia Preprocedure Evaluation (Addendum)
Anesthesia Evaluation  Patient identified by MRN, date of birth, ID band Patient awake    Reviewed: Allergy & Precautions, NPO status , Patient's Chart, lab work & pertinent test results  History of Anesthesia Complications Negative for: history of anesthetic complications  Airway Mallampati: III  TM Distance: >3 FB Neck ROM: full    Dental no notable dental hx.    Pulmonary shortness of breath, asthma , COPD (4L),  oxygen dependent, former smoker Lung cancer   Pulmonary exam normal        Cardiovascular hypertension, On Medications + CAD  + dysrhythmias Atrial Fibrillation + Valvular Problems/Murmurs AI and MR  Rhythm:Irregular Rate:Abnormal  2D echocardiogram 04/23/2022 revealed normal left ventricular function, with LVEF greater than 55%, with moderate mitral regurgitation and aortic insufficiency.    Neuro/Psych  PSYCHIATRIC DISORDERS Anxiety      Neuromuscular disease    GI/Hepatic Neg liver ROS, hiatal hernia, [name],GERD  Controlled,,dysphagia   Endo/Other  diabetes    Renal/GU Renal disease  negative genitourinary   Musculoskeletal  (+) Arthritis ,    Abdominal   Peds  Hematology negative hematology ROS (+)   Anesthesia Other Findings Past Medical History: No date: Abnormal nuclear stress test No date: Anxiety     Comment:  a.) on BZO (lorazepam) PRN No date: Asthma No date: Avascular necrosis of femoral head (HCC) No date: BPH (benign prostatic hyperplasia) No date: Cataract     Comment:  a.) s/p extraction on LEFT No date: Colon adenomas No date: COPD (chronic obstructive pulmonary disease) (HCC) 11/11/2014: Coronary artery disease     Comment:  a.) MPI 11/11/2014: EF 56%, mild inf wall ischemia; b.)               LHC 12/16/2014: 20% mRCA - med mgmt No date: DDD (degenerative disc disease), lumbar No date: Diverticulosis No date: DM type 2 (diabetes mellitus, type 2) (HCC) No date: Duodenitis No  date: Dysphagia No date: Dyspnea No date: ED (erectile dysfunction)     Comment:  a.) on PDE5i (sildenafil) PRN No date: Elevated PSA No date: Former smoker No date: Gastritis No date: GERD (gastroesophageal reflux disease) No date: [name] No date: Hilar mass No date: History of hiatal hernia No date: Hyperlipidemia No date: Hypertension No date: Hyperuricemia No date: Hypokalemia 10/04/2016: Left ventricular diastolic dysfunction     Comment:  a.) TTE 10/04/2016: EF 50%, mild MR/TR/PR, mod AR, G1DD;              b.) TTE 10/10/2017: EF 45%, triv PR, mild AR/MR/TR, G1DD;              c.) TTE 03/30/2020: EF 50%, mild-mod AR, mild MR/TR/PR;               d.) TTE 04/23/2022: EF >55%, mild LVH, mild LAE, triv TR,              mild PR, mod AR/MR No date: Liver lesion No date: Obesity No date: Pedal edema No date: Peripheral polyneuropathy 05/2022: Peyronie's disease No date: Pleural effusion No date: Primary lung adenocarcinoma, right (HCC)     Comment:  a.) stage IIIA (cT3, cN1, cM0) --> s/p concurrent               chemoradiation 2019 --> carboplatin + pacilitaxel               (12/31/2017 - 02/19/2018) followed by 1 year (03/24/2018               -  03/17/2019) adjuvant anti-PDL-1 mAB immunotherapy               (durvalumab) No date: [name] (peptic ulcer disease) No date: Renal insufficiency No date: Umbilical hernia No date: [name]  Past Surgical History: 12/16/2014: CARDIAC CATHETERIZATION; N/A     Comment:  Procedure: Left Heart Cath;  Surgeon: Marcina Millard, MD;  Location: [facility];  Service:              Cardiovascular;  Laterality: N/A; No date: CATARACT EXTRACTION; Left No date: COLONOSCOPY 01/14/2020: COLONOSCOPY WITH PROPOFOL; N/A     Comment:  Procedure: COLONOSCOPY WITH PROPOFOL;  Surgeon: [place],               Boykin Nearing, MD;  Location: [facility];  Service:               Gastroenterology;   Laterality: N/A; No date: CYST REMOVAL TRUNK     Comment:  chest and back over time and it was removed No date: CYSTECTOMY 12/09/2017: ENDOBRONCHIAL ULTRASOUND; N/A     Comment:  Procedure: ENDOBRONCHIAL ULTRASOUND;  Surgeon:               Shane Crutch, MD;  Location: [name];  Service:              Pulmonary;  Laterality: N/A; 12/23/2017: PORTA CATH INSERTION; N/A     Comment:  Procedure: PORTA CATH INSERTION;  Surgeon: Annice Needy,              MD;  Location: [facility];  Service:               Cardiovascular;  Laterality: N/A; 01/08/2023: PORTA CATH INSERTION; N/A     Comment:  Procedure: PORTA CATH INSERTION;  Surgeon: Annice Needy,              MD;  Location: [facility];  Service:               Cardiovascular;  Laterality: N/A; 06/04/2022: PORTA CATH REMOVAL; N/A     Comment:  Procedure: PORTA CATH REMOVAL;  Surgeon: Annice Needy,               MD;  Location: [facility];  Service:               Cardiovascular;  Laterality: N/A; No date: UPPER GI ENDOSCOPY 12/17/2022: VIDEO BRONCHOSCOPY WITH ENDOBRONCHIAL ULTRASOUND; N/A     Comment:  Procedure: VIDEO BRONCHOSCOPY WITH ENDOBRONCHIAL               ULTRASOUND;  Surgeon: Vida Rigger, MD;  Location:               [name];  Service: Thoracic;  Laterality: N/A;     Reproductive/Obstetrics negative OB ROS                             Anesthesia Physical Anesthesia Plan  ASA: 3  Anesthesia Plan: General   Post-op Pain Management: Minimal or no pain anticipated   Induction: Intravenous  PONV Risk Score and Plan: Propofol infusion and TIVA  Airway Management Planned: Natural Airway and Nasal Cannula  Additional Equipment:   Intra-op Plan:   Post-operative Plan:   Informed Consent: I have reviewed the patients History and Physical, chart, labs  and discussed the procedure including the risks, benefits and alternatives for the proposed anesthesia with the  patient or authorized representative who has indicated his/her understanding and acceptance.     Dental Advisory Given  Plan Discussed with: Anesthesiologist, CRNA and Surgeon  Anesthesia Plan Comments: (Patient consented for risks of anesthesia including but not limited to:  - adverse reactions to medications - risk of airway placement if required - damage to eyes, teeth, lips or other oral mucosa - nerve damage due to positioning  - sore throat or hoarseness - Damage to heart, brain, nerves, lungs, other parts of body or loss of life  Patient voiced understanding.)        Anesthesia Quick Evaluation

## 2023-01-17 NOTE — H&P (Signed)
Established Patient Visit   Chief Complaint: Chief Complaint  Patient presents with   Shortness of Breath    Occas    Atrial Flutter    X 1 week    Date of Service: 01/16/2023 Date of Birth: 1951-02-03 PCP: Mechele Collin, NP [address] [place] [address]  History of Present Illness:   Mark Mccarty is a 72 y.o.male patient that presents for f/u.  Last seen by Dr. Darrold Junker 10/16/22.  Pt was doing well at that time with no changes to the plan of care.  Presents for f/u r/t:             1.  Essential hypertension             2.  Hyperlipidemia             3.  Normal coronary anatomy, normal LVF by cardiac cath 12/16/2014             4.  Moderate aortic insufficiency             5.  COPD             6.  Hyperuricemia and gout, exacerbated by HCTZ             7.  Right adenocarcinoma of the lung             8.  S/P R porta Cath placement 01/08/2023  Since last visit, Mark Mccarty reports concerns today: Acute worsening of chronic dyspnea started 2 months ago. Adenocarcinoma of lung noted to no longer be in remission but recurrent. Started radiation and chemo with Dr. Cathie Hoops 2 weeks ago.    2D echocardiogram 04/23/2022 revealed normal left ventricular function, with LVEF greater than 55%, with moderate mitral regurgitation and aortic insufficiency.  2D echo 03/30/2020 revealed LVEF of 50% with mild mitral regurgitation, tricuspid regurgitation, and aortic insufficiency. Prior 2D echocardiogram on 10/10/2017 revealed LVEF of 45-50% with mild to moderate aortic and mitral insufficiency which is largely unchanged from previous 2D echocardiogram on 09/30/2016.     The patient was in the cancer center and noted to be in atrial flutter with 2:1 conduction going at a rate of 142 bpm. I spoke with the cancer nurse and recommended patient be seen in our office. He has been on [facility] for only a week now and metoprolol is not controlling his rates as it should. Patient is agreeable to proceed with  TEE and DCCV to hopefully restore normal sinus rhythm. Patient denies chest pain, diaphoresis, syncope, edema, PND, orthopnea.   Past Medical and Surgical History  Past Medical History Past Medical History:  Diagnosis Date   Adenomatous polyp of colon 04/02/2014   Aortic insufficiency 01/20/2014   Avascular necrosis of femoral head (CMS/HHS-HCC) 12/10/2018   bilateral on 12/10/2018 MRI   Benign prostatic hyperplasia with nocturia 03/19/2018   Centrilobular emphysema (CMS/HHS-HCC)    DDD (degenerative disc disease), lumbar 08/04/2019   Diverticulosis    Dysphagia 04/02/2014   Elevated PSA, less than 10 ng/ml 03/22/2018   7.84 on 03/19/2018   Erectile dysfunction 01/20/2014   Gastritis and duodenitis    on 04/29/2014 EGD   Gastroesophageal reflux 03/06/2014   H/O adenomatous polyp of colon    H/O sebaceous cyst    multiple   History of [name] 03/1998   History of peptic ulcer disease    Hyperlipidemia associated with type 2 diabetes mellitus  (CMS/HHS-HCC)    Hypertension associated with  type 2 diabetes mellitus  (CMS/HHS-HCC) 03/06/2014   Left ventricular diastolic dysfunction 01/20/2014   LVEF 45% on echo 09/05/07 with moderate AI, MR, TR, mildly reduced     Pedal edema 12/04/2016   Peripheral polyneuropathy 03/18/2019   Primary lung adenocarcinoma, right (CMS/HHS-HCC) 03/19/2018   Renal insufficiency 01/20/2014   Type 2 diabetes mellitus with other specified complication (CMS/HHS-HCC)    Umbilical hernia without obstruction and without gangrene 04/07/2019   [name] 09/18/2018   25 OH vitamin D 14.0 on 09/18/18    Past Surgical History He has a past surgical history that includes Colonoscopy (04/16/2003); Colonoscopy (03/03/2009); Colonoscopy (04/29/2014); egd (04/29/2014); extraction cataract extracapsular w/insertion intraocular prosthesis (Left, 09/30/2014); Insertion central venous access device w/ subcutaneous port (Right, 12/23/2017); Cystectomy (N/A,  04/22/2019); and Colonoscopy (01/14/2020).   Medications and Allergies  Current Medications  Current Outpatient Medications on File Prior to Visit  Medication Sig Dispense Refill   acetaminophen (TYLENOL) 500 MG tablet Take 500 mg by mouth every 6 (six) hours as needed for Pain.     albuterol MDI, PROVENTIL, VENTOLIN, PROAIR, HFA 90 mcg/actuation inhaler Inhale 2 inhalations into the lungs every 4 (four) hours as needed 6.7 g 0   amLODIPine (NORVASC) 10 MG tablet Take 1 tablet (10 mg total) by mouth once daily 90 tablet 3   apixaban (ELIQUIS) 5 mg tablet Take 1 tablet (5 mg total) by mouth every 12 (twelve) hours 60 tablet 11   ascorbic acid, vitamin C, (VITAMIN C) 1000 MG tablet Take 1,000 mg by mouth once daily     aspirin 81 MG EC tablet Take 81 mg by mouth once daily.     cetirizine (ZYRTEC) 10 MG tablet Take 1 tablet (10 mg total) by mouth once daily 30 tablet 11   cholecalciferol (VITAMIN D3) 1000 unit capsule Take 1,000 Units by mouth once daily     dexAMETHasone (DECADRON) 4 MG tablet Take 2 tablets (8mg ) by mouth daily starting the day after carboplatin for 3 days. Take with food     fluticasone propionate (FLONASE) 50 mcg/actuation nasal spray Place 1 spray into both nostrils once daily (OK to use twice daily initially for about a week) 16 g 11   fluticasone-umeclidinium-vilanterol (TRELEGY ELLIPTA) 200-62.5-25 mcg inhaler Inhale 1 Puff into the lungs once daily 3 each 2   gabapentin (NEURONTIN) 300 MG capsule Take 3 capsules by mouth at bedtime and 1 capsule by mouth twice daily with breakfast and lunch for Neuropathy 440 capsule 3   ipratropium (ATROVENT) 0.06 % nasal spray Place 2 sprays into both nostrils 3 (three) times daily as needed for Rhinitis 15 mL 11   ipratropium-albuteroL (DUO-NEB) nebulizer solution Take 3 mLs by nebulization 4 (four) times daily as needed for Wheezing for up to 360 days 360 mL 11   lidocaine-prilocaine (EMLA) cream Apply to affected area once      LORazepam (ATIVAN) 0.5 MG tablet Take 0.5 mg by mouth every 12 (twelve) hours as needed     metoprolol tartrate (LOPRESSOR) 50 MG tablet Take 1 tablet (50 mg total) by mouth 2 (two) times daily 180 tablet 3   ondansetron (ZOFRAN) 8 MG tablet Take 8 mg by mouth     potassium chloride (KLOR-CON) 10 MEQ ER tablet Take 1 tablet (10 mEq total) by mouth once daily 90 tablet 3   pravastatin (PRAVACHOL) 20 MG tablet Take 1 tablet (20 mg total) by mouth once daily 90 tablet 3   prochlorperazine (COMPAZINE) 10 MG tablet Take  10 mg by mouth every 6 (six) hours as needed     sennosides (SENOKOT) 8.6 mg tablet Take 1 tablet by mouth once daily as needed     solifenacin (VESICARE) 10 MG tablet Take 1 tablet (10 mg total) by mouth once daily 90 tablet 3   spironolactone (ALDACTONE) 25 MG tablet Take 1 tablet (25 mg total) by mouth once daily TAKE TOGETHER WITH TORSEMIDE 20MG  EVERY DAY 30 tablet 11   tamsulosin (FLOMAX) 0.4 mg capsule Take 2 capsules (0.8 mg total) by mouth once daily Take 30 minutes after same meal each day. 180 capsule 3   TORsemide (DEMADEX) 20 MG tablet Take 1 tablet (20 mg total) by mouth once daily 90 tablet 3   vibegron (GEMTESA) 75 mg Tab Take 75 mg by mouth once daily     lisinopriL (ZESTRIL) 40 MG tablet Take 1 tablet (40 mg total) by mouth every morning (Patient not taking: Reported on 01/16/2023) 90 tablet 3   predniSONE (DELTASONE) 10 MG tablet 6 PO Q D X 1 DAY, THEN 5 PO Q D X 1 DAY, THEN 4 PO Q D X 1 DAY, THEN 3 PO Q D X 1 DAY, THEN 2 PO Q D X 1 DAY, THEN 1 PO Q D X 1 DAY (Patient not taking: Reported on 01/16/2023) 21 tablet 0   No current facility-administered medications on file prior to visit.    Allergies: Penicillins  Social and Family History  Social History  reports that he quit smoking about 7 years ago. His smoking use included cigarettes. He started smoking about 52 years ago. He has a 67.5 pack-year smoking history. He has been exposed to tobacco smoke. He has never  used smokeless tobacco. He reports that he does not currently use alcohol. He reports that he does not use drugs.  Family History Family History  Problem Relation Name Age of Onset   Colon cancer Sister     Myocardial Infarction (Heart attack) Mother     High blood pressure (Hypertension) Mother     Stroke Mother     Breast cancer Mother     Colon cancer Mother     Myocardial Infarction (Heart attack) Father     Prostatitis Brother     High blood pressure (Hypertension) Sister     Heart disease Sister     High blood pressure (Hypertension) Sister     Heart disease Sister     High blood pressure (Hypertension) Sister     ESRD-Dialysis Sister     High blood pressure (Hypertension) Sister     High blood pressure (Hypertension) Sister     High blood pressure (Hypertension) Brother     No Known Problems Brother     No Known Problems Brother     No Known Problems Brother     No Known Problems Son     No Known Problems Son     Brain/spinal defects Daughter     No Known Problems Daughter     Breast cancer Other     Gout Other     Heart disease Other     Kidney disease Other     Stroke Other      Review of Systems   Review of Systems  Respiratory:  Positive for shortness of breath.   Cardiovascular:  Negative for chest pain, palpitations and leg swelling.  Neurological:  Positive for dizziness.      Physical Examination   Vitals:BP 100/60 (BP Location: Left upper  arm, Patient Position: Sitting, BP Cuff Size: Adult)   Pulse (!) 143   Resp 16   Ht 182.9 cm (6')   Wt 93.9 kg (207 lb)   SpO2 95% Comment: 4L O2  BMI 28.07 kg/m  Ht:182.9 cm (6') Wt:93.9 kg (207 lb) WGN:FAOZ surface area is 2.18 meters squared. Body mass index is 28.07 kg/m.  Physical Exam Vitals reviewed.  Constitutional:      General: He is not in acute distress.    Appearance: Normal appearance.  Neck:     Vascular: No carotid bruit.  Cardiovascular:     Rate and Rhythm: Normal rate. Rhythm  irregularly irregular.     Pulses: Normal pulses.          Radial pulses are 2+ on the right side and 2+ on the left side.     Heart sounds: Normal heart sounds. No murmur heard. Pulmonary:     Effort: Pulmonary effort is normal. No respiratory distress.     Breath sounds: Normal breath sounds.  Musculoskeletal:     Right lower leg: No edema.     Left lower leg: No edema.  Neurological:     Mental Status: He is alert.     Data & Results   Recent Labs    09/20/20 1054 09/22/21 1103 09/19/22 0947  CHOLTOTAL 150 136 162  HDL 41.2 43.6 52.5  LDLCALC 76 59 75  VLDL 33 33 34  TRIG 164 166 171    Recent Labs    09/22/21 1103 04/16/22 0852 09/19/22 0947  NA 140 142 138  K 3.7 3.8 3.7  BUN 14 9 14   CREATININE 0.9 0.9 0.8  CO2 30.4 33.8* 25.4  GLUCOSE 107 124* 107  ALT 17 20 14   AST 16 22 18   TBILI 0.5 0.5 0.7  ALB 4.2 4.3 4.3    Recent Labs    07/01/20 1059 09/22/21 1103 09/19/22 0947  WBC 10.9* 8.0 7.1  HGB 15.2 15.2 15.4  HCT 45.0 43.8 46.5  MCV 88.1 85.5 86.4  PLT 250 238 234    Recent Labs    09/22/21 1103 04/16/22 0852 09/19/22 0947  TSH 1.887  --  2.013  HGBA1C 6.2* 6.3* 6.1*        Assessment   72 y.o. male with  Encounter Diagnoses  Name Primary?   Atrial flutter with rapid ventricular response (CMS/HHS-HCC) Yes   HTN, goal below 130/80    Moderate aortic insufficiency    Primary lung adenocarcinoma, right (CMS/HHS-HCC)    Chronic obstructive pulmonary disease, unspecified COPD type (CMS/HHS-HCC)     Plan   Orders Placed This Encounter  Procedures   ECG 12-lead   Echo complete   1. Atrial flutter with rapid ventricular response (CMS/HHS-HCC) -     ECG 12-lead -     Echo complete; Future  2. HTN, goal below 130/80 -     Echo complete; Future  3. Moderate aortic insufficiency -     Echo complete; Future  4. Primary lung adenocarcinoma, right (CMS/HHS-HCC) -     Echo complete; Future  5. Chronic obstructive pulmonary  disease, unspecified COPD type (CMS/HHS-HCC) -     Echo complete; Future   Keep scheduled office visit on Friday 01/18/2023. Schedule for TEE/Direct current cardioversion. I have discussed regarding risks benefits rate control vs rhythm control with the patient. Patient understands cardiac arrest and need for CPR, aspiration pneumonia, but not limited to these. Patient is willing. Will obtain echo once tachycardia  improves, likely some time in August as he will have some stunned myocardium post cardioversion. He is not a great candidate for amiodarone given his lung cancer. I have advised him to take an extra metoprolol pill when he is having sustained fast heart rates.   No follow-ups on file.   Attestation Statement:   I personally performed the service, non-incident to. (WP)   Sianni Cloninger, DO

## 2023-01-18 ENCOUNTER — Other Ambulatory Visit: Payer: Self-pay

## 2023-01-18 ENCOUNTER — Ambulatory Visit
Admission: RE | Admit: 2023-01-18 | Discharge: 2023-01-18 | Disposition: A | Discharge status: Home / Self Care | Payer: Medicare Other | Source: Ambulatory Visit | Attending: Radiation Oncology | Admitting: Radiation Oncology

## 2023-01-18 ENCOUNTER — Ambulatory Visit (INDEPENDENT_AMBULATORY_CARE_PROVIDER_SITE_OTHER): Payer: Medicare Other | Admitting: Vascular Surgery

## 2023-01-18 ENCOUNTER — Encounter: Payer: Self-pay | Admitting: Internal Medicine

## 2023-01-18 VITALS — BP 136/80 | HR 80 | Resp 15 | Wt 204.0 lb

## 2023-01-18 DIAGNOSIS — Z95828 Presence of other vascular implants and grafts: Secondary | ICD-10-CM

## 2023-01-18 DIAGNOSIS — E1169 Type 2 diabetes mellitus with other specified complication: Secondary | ICD-10-CM

## 2023-01-18 DIAGNOSIS — C3491 Malignant neoplasm of unspecified part of right bronchus or lung: Secondary | ICD-10-CM

## 2023-01-18 DIAGNOSIS — E1159 Type 2 diabetes mellitus with other circulatory complications: Secondary | ICD-10-CM

## 2023-01-18 DIAGNOSIS — Z510 Encounter for antineoplastic radiation therapy: Secondary | ICD-10-CM | POA: Diagnosis not present

## 2023-01-18 DIAGNOSIS — I152 Hypertension secondary to endocrine disorders: Secondary | ICD-10-CM

## 2023-01-18 LAB — RAD ONC ARIA SESSION SUMMARY
Course Elapsed Days: 24
Plan Fractions Treated to Date: 17
Plan Prescribed Dose Per Fraction: 2 Gy
Plan Total Fractions Prescribed: 20
Plan Total Prescribed Dose: 40 Gy
Reference Point Dosage Given to Date: 34 Gy
Reference Point Session Dosage Given: 2 Gy
Session Number: 17

## 2023-01-18 NOTE — Progress Notes (Unsigned)
MRN : [id number]  Mark Mccarty is a 72 y.o. (1950/07/18) male who presents with chief complaint of  Chief Complaint  Patient presents with   Follow-up    Check port-a-cath  .  History of Present Illness: Patient returns today in follow up of his port a cath. ***  Current Outpatient Medications  Medication Sig Dispense Refill   albuterol (VENTOLIN HFA) 108 (90 Base) MCG/ACT inhaler Inhale 2 puffs into the lungs every 4 (four) hours as needed.     amLODipine (NORVASC) 10 MG tablet Take 10 mg by mouth every morning.     ascorbic acid (VITAMIN C) 1000 MG tablet Take 1,000 mg by mouth daily.     aspirin 81 MG tablet Take 81 mg by mouth daily.     carvedilol (COREG) 25 MG tablet Take 25 mg by mouth 2 (two) times daily with a meal.     cholecalciferol (VITAMIN D3) 25 MCG (1000 UNIT) tablet Take 1,000 Units by mouth daily.     Fluticasone-Umeclidin-Vilant (TRELEGY ELLIPTA) 200-62.5-25 MCG/ACT AEPB Inhale 1 puff into the lungs every morning.     gabapentin (NEURONTIN) 300 MG capsule Take 300 mg by mouth at bedtime as needed.      ipratropium-albuterol (DUONEB) 0.5-2.5 (3) MG/3ML SOLN Take 3 mLs by nebulization every 6 (six) hours as needed.     lidocaine-prilocaine (EMLA) cream Apply to affected area once 30 g 3   lisinopril (PRINIVIL,ZESTRIL) 40 MG tablet Take 40 mg by mouth every morning.     LORazepam (ATIVAN) 0.5 MG tablet Take 1 tablet (0.5 mg total) by mouth every 12 (twelve) hours as needed for anxiety. 30 tablet 0   ondansetron (ZOFRAN) 8 MG tablet Take 1 tablet (8 mg total) by mouth every 8 (eight) hours as needed for nausea or vomiting. Start on the third day after carboplatin. 30 tablet 1   potassium chloride (KLOR-CON) 10 MEQ tablet Take 1 tablet (10 mEq total) by mouth daily. (Patient taking differently: Take 10 mEq by mouth every morning.) 90 tablet 1   pravastatin (PRAVACHOL) 20 MG tablet Take 20 mg by mouth at bedtime.     prochlorperazine (COMPAZINE) 10 MG tablet Take 1  tablet (10 mg total) by mouth every 6 (six) hours as needed for nausea or vomiting. 30 tablet 1   senna-docusate (SENOKOT-S) 8.6-50 MG tablet Take 2 tablets by mouth daily. 60 tablet 2   spironolactone (ALDACTONE) 25 MG tablet Take by mouth.     tamsulosin (FLOMAX) 0.4 MG CAPS capsule Take 0.4 mg by mouth daily after breakfast.     torsemide (DEMADEX) 20 MG tablet Take 20 mg by mouth every morning.     Vibegron (GEMTESA) 75 MG TABS Take 1 tablet (75 mg total) by mouth daily. (Patient taking differently: Take 1 tablet by mouth every morning.) 30 tablet 0   acetaminophen (TYLENOL) 500 MG tablet Take 500 mg by mouth as needed. (Patient not taking: Reported on 01/17/2023)     cetirizine (ZYRTEC) 10 MG tablet Take 1 tablet by mouth as needed. (Patient not taking: Reported on 01/17/2023)     dexamethasone (DECADRON) 4 MG tablet Take 2 tablets (8mg ) by mouth daily starting the day after carboplatin for 3 days. Take with food (Patient not taking: Reported on 01/17/2023) 30 tablet 1   ibuprofen (ADVIL,MOTRIN) 600 MG tablet Take 600 mg by mouth every 6 (six) hours as needed. (Patient not taking: Reported on 01/17/2023)     sildenafil (VIAGRA) 50 MG tablet Take  1-2 tablets (50-100 mg total) by mouth daily as needed for erectile dysfunction. (Patient not taking: Reported on 01/17/2023) 30 tablet 6   No current facility-administered medications for this visit.   Facility-Administered Medications Ordered in Other Visits  Medication Dose Route Frequency Provider Last Rate Last Admin   sodium chloride flush (NS) 0.9 % injection 10 mL  10 mL Intravenous PRN [name], MD   10 mL at 03/18/18 4098    Past Medical History:  Diagnosis Date   Abnormal nuclear stress test    Anxiety    a.) on BZO (lorazepam) PRN   Asthma    Avascular necrosis of femoral head (HCC)    BPH (benign prostatic hyperplasia)    Cataract    a.) s/p extraction on LEFT   Colon adenomas    COPD (chronic obstructive pulmonary disease) (HCC)     Coronary artery disease 11/11/2014   a.) MPI 11/11/2014: EF 56%, mild inf wall ischemia; b.) LHC 12/16/2014: 20% mRCA - med mgmt   DDD (degenerative disc disease), lumbar    Diverticulosis    DM type 2 (diabetes mellitus, type 2) (HCC)    Duodenitis    Dysphagia    Dyspnea    ED (erectile dysfunction)    a.) on PDE5i (sildenafil) PRN   Elevated PSA    Former smoker    Gastritis    GERD (gastroesophageal reflux disease)    [name]    Hilar mass    History of hiatal hernia    Hyperlipidemia    Hypertension    Hyperuricemia    Hypokalemia    Left ventricular diastolic dysfunction 10/04/2016   a.) TTE 10/04/2016: EF 50%, mild MR/TR/PR, mod AR, G1DD; b.) TTE 10/10/2017: EF 45%, triv PR, mild AR/MR/TR, G1DD; c.) TTE 03/30/2020: EF 50%, mild-mod AR, mild MR/TR/PR; d.) TTE 04/23/2022: EF >55%, mild LVH, mild LAE, triv TR, mild PR, mod AR/MR   Liver lesion    Obesity    Pedal edema    Peripheral polyneuropathy    Peyronie's disease 05/2022   Pleural effusion    Primary lung adenocarcinoma, right (HCC)    a.) stage IIIA (cT3, cN1, cM0) --> s/p concurrent chemoradiation 2019 --> carboplatin + pacilitaxel (12/31/2017 - 02/19/2018) followed by 1 year (03/24/2018 - 03/17/2019) adjuvant anti-PDL-1 mAB immunotherapy (durvalumab)   [name] (peptic ulcer disease)    Renal insufficiency    Umbilical hernia    [name]     Past Surgical History:  Procedure Laterality Date   CARDIAC CATHETERIZATION N/A 12/16/2014   Procedure: Left Heart Cath;  Surgeon: Marcina Millard, MD;  Location: [facility];  Service: Cardiovascular;  Laterality: N/A;   CARDIOVERSION N/A 01/17/2023   Procedure: CARDIOVERSION;  Surgeon: Clotilde Dieter, DO;  Location: [name];  Service: Cardiovascular;  Laterality: N/A;   CATARACT EXTRACTION Left    COLONOSCOPY     COLONOSCOPY WITH PROPOFOL N/A 01/14/2020   Procedure: COLONOSCOPY WITH PROPOFOL;  Surgeon: Toledo, Boykin Nearing,  MD;  Location: [facility];  Service: Gastroenterology;  Laterality: N/A;   CYST REMOVAL TRUNK     chest and back over time and it was removed   CYSTECTOMY     ENDOBRONCHIAL ULTRASOUND N/A 12/09/2017   Procedure: ENDOBRONCHIAL ULTRASOUND;  Surgeon: Shane Crutch, MD;  Location: [name];  Service: Pulmonary;  Laterality: N/A;   PORTA CATH INSERTION N/A 12/23/2017   Procedure: PORTA CATH INSERTION;  Surgeon: Annice Needy, MD;  Location: [facility];  Service: Cardiovascular;  Laterality: N/A;   PORTA CATH INSERTION N/A 01/08/2023   Procedure: PORTA CATH INSERTION;  Surgeon: Annice Needy, MD;  Location: [facility];  Service: Cardiovascular;  Laterality: N/A;   PORTA CATH REMOVAL N/A 06/04/2022   Procedure: PORTA CATH REMOVAL;  Surgeon: Annice Needy, MD;  Location: [facility];  Service: Cardiovascular;  Laterality: N/A;   TEE WITHOUT CARDIOVERSION N/A 01/17/2023   Procedure: TRANSESOPHAGEAL ECHOCARDIOGRAM (TEE);  Surgeon: Clotilde Dieter, DO;  Location: [name];  Service: Cardiovascular;  Laterality: N/A;   UPPER GI ENDOSCOPY     VIDEO BRONCHOSCOPY WITH ENDOBRONCHIAL ULTRASOUND N/A 12/17/2022   Procedure: VIDEO BRONCHOSCOPY WITH ENDOBRONCHIAL ULTRASOUND;  Surgeon: Vida Rigger, MD;  Location: [name];  Service: Thoracic;  Laterality: N/A;     Social History   Tobacco Use   Smoking status: Former    Current packs/day: 0.00    Average packs/day: 1.5 packs/day for 35.0 years (52.5 ttl pk-yrs)    Types: Cigarettes    Start date: 06/21/1980    Quit date: 06/22/2015    Years since quitting: 7.5    Passive exposure: Past   Smokeless tobacco: Never  Vaping Use   Vaping status: Never Used  Substance Use Topics   Alcohol use: Not Currently    Comment: weekends   Drug use: No      Family History  Problem Relation Age of Onset   Hypertension Mother    Breast cancer Mother    Heart attack Mother    Lung cancer Father    Heart disease Sister     Diabetes Sister    Colon cancer Sister      Allergies  Allergen Reactions   Penicillins Rash    Stomach hurt Has patient had a PCN reaction causing immediate rash, facial/tongue/throat swelling, SOB or lightheadedness with hypotension: yes Has patient had a PCN reaction causing severe rash involving mucus membranes or skin necrosis: no Has patient had a PCN reaction that required hospitalization: no Has patient had a PCN reaction occurring within the last 10 years: yes If all of the above answers are "NO", then may proceed with Cephalosporin use.      REVIEW OF SYSTEMS (Negative unless checked)  Constitutional: [x] Weight loss  [] Fever  [] Chills Cardiac: [] Chest pain   [] Chest pressure   [] Palpitations   [] Shortness of breath when laying flat   [] Shortness of breath at rest   [] Shortness of breath with exertion. Vascular:  [] Pain in legs with walking   [] Pain in legs at rest   [] Pain in legs when laying flat   [] Claudication   [] Pain in feet when walking  [] Pain in feet at rest  [] Pain in feet when laying flat   [] History of DVT   [] Phlebitis   [] Swelling in legs   [] Varicose veins   [x] Non-healing ulcers Pulmonary:   [] Uses home oxygen   [] Productive cough   [] Hemoptysis   [] Wheeze  [] COPD   [] Asthma Neurologic:  [] Dizziness  [] Blackouts   [] Seizures   [] History of stroke   [] History of TIA  [] Aphasia   [] Temporary blindness   [] Dysphagia   [] Weakness or numbness in arms   [] Weakness or numbness in legs Musculoskeletal:  [] Arthritis   [] Joint swelling   [] Joint pain   [] Low back pain Hematologic:  [] Easy bruising  [] Easy bleeding   [] Hypercoagulable state   [] Anemic   Gastrointestinal:  [] Blood in stool   [] Vomiting blood  [] Gastroesophageal reflux/heartburn   [] Abdominal pain  Genitourinary:  [] Chronic kidney disease   [] Difficult urination  [] Frequent urination  [] Burning with urination   [] Hematuria Skin:  [] Rashes   [x] Ulcers   [x] Wounds Psychological:  [] History of anxiety   []   History of major depression.  Physical Examination  BP 136/80 (BP Location: Left Arm)   Pulse 80   Resp 15   Wt 204 lb (92.5 kg)   BMI 27.67 kg/m  Gen:  WD/WN, NAD Head: [place]/AT, No temporalis wasting. Ear/Nose/Throat: Hearing grossly intact, nares w/o erythema or drainage Eyes: Conjunctiva clear. Sclera non-icteric Neck: Supple.  Trachea midline Pulmonary:  Good air movement, no use of accessory muscles.  Cardiac: RRR, no JVD Vascular: Port site is bruised.  There is a small area from the needle access site that has not epithelialized.  No erythema or purulent drainage present. Vessel Right Left  Radial Palpable Palpable           Neurologic: Sensation grossly intact in extremities.  Symmetrical.  Speech is fluent.  Psychiatric: Judgment intact, Mood & affect appropriate for pt's clinical situation. Dermatologic: No rashes or ulcers noted.  No cellulitis or open wounds.      Labs Recent Results (from the past 2160 hour(s))  BLADDER SCAN AMB NON-IMAGING     Status: None   Collection Time: 11/13/22 10:45 AM  Result Value Ref Range   Scan Result 22ml   Cytology - Non PAP;     Status: None   Collection Time: 11/20/22  2:16 PM  Result Value Ref Range   CYTOLOGY - NON GYN      CYTOLOGY - NON PAP CASE: [id number] PATIENT: Mark Mccarty Non-Gynecological Cytology Report     Specimen Submitted: A. Pleural fluid, right  Clinical History: History of stage IIIA cT3 N1 M0 lung adenocarcinoma, s/p concurrent chemoradiation      DIAGNOSIS: A. PLEURAL FLUID; RIGHT; THORACENTESIS: - POSITIVE FOR MALIGNANCY. - ADENOCARCINOMA IS PRESENT.  There is insufficient material for ancillary molecular testing.  Specimen is adequate for interpretation.  Comment: Rare atypical epithelial groups are present in the cell block Given this patient's history of lung adenocarcinoma a limited panel of stains was performed. The epithelial groups are positive for MOC31 and negative  for TTF-1. These findings are consistent with metastatic adenocarcinoma. The absence of TTF-1 staining may be due to the limited amount of diagnostic material in the cell block. Clinical correlation is required.  IHC slides were prepared by [facility], [place], [place]. All controls stained appropriately.  This test was developed and its performance characteristics determined by [facility]. It has not been cleared or approved by the [place] Food and Drug Administration. The FDA does not require this test to go through premarket FDA review. This test is used for clinical purposes. It should not be regarded as investigational or for research. This laboratory is certified under the Clinical Laboratory Improvement Amendments (CLIA) as qualified to perform high complexity clinical laboratory testing.   GROSS DESCRIPTION: A. Labeled: Right pleural fluid Received: Fresh Collection time: 2:03 PM on 11/20/2022 Placed into formalin time: Not applicable Volume: Approximately 1000 mL Description of fluid and container in which it is received: Received in a glass 1000 mL evacuated container is orange, cloudy fluid Cytospin slide(s) received: Yes, 1  Specimen material submitted for: Cell block and ThinPrep  The cell block m aterial is fixed in formalin for 6 hours prior to processing.  CM 11/21/2022  Final Diagnosis performed by Elijah Birk, MD.   Electronically  signed 11/23/2022 12:14:59PM The electronic signature indicates that the named Attending Pathologist has evaluated the specimen Technical component performed at [facility], [address], [place], [place] [address] Lab: [phone] Dir: Jolene Schimke, MD, MMM  Professional component performed at [facility], [facility], [address] [place], [place], [place] [address] Lab: [phone] Dir: Beryle Quant, MD   Lactate dehydrogenase (pleural or peritoneal fluid)     Status: Abnormal   Collection  Time: 11/20/22  2:16 PM  Result Value Ref Range   LD, Fluid 463 (H) 3 - 23 U/L    Comment: (NOTE) Results should be evaluated in conjunction with serum values    Fluid Type-FLDH CYTO PLEU     Comment: Performed at [facility], [address] [place], [place] [address]  Body fluid cell count with differential     Status: Abnormal   Collection Time: 11/20/22  2:16 PM  Result Value Ref Range   Fluid Type-FCT CYTO PLEU    Color, Fluid YELLOW (A) YELLOW   Appearance, Fluid CLOUDY (A) CLEAR   Total Nucleated Cell Count, Fluid 2,060 cu mm   Neutrophil Count, Fluid 0 %   Lymphs, Fluid 25 %   Monocyte-Macrophage-Serous Fluid 75 %   Eos, Fluid 0 %    Comment: Performed at [facility], [address] [place], [place] [address]  Gram stain     Status: None   Collection Time: 11/20/22  2:16 PM   Specimen: PATH Cytology Pleural fluid  Result Value Ref Range   Specimen Description      PLEURAL Performed at [facility], [address] [place], [place] [address]    Special Requests      NONE Performed at [facility], [address] [place], [place] [address]    Gram Stain      NO WBC SEEN NO ORGANISMS SEEN Performed at [facility], [address], [place], [place] [address]    Report Status 11/20/2022 FINAL   Albumin, pleural or peritoneal fluid      Status: None   Collection Time: 11/20/22  2:16 PM  Result Value Ref Range   Albumin, Fluid 2.5 g/dL    Comment: (NOTE) No normal range established for this test Results should be evaluated in conjunction with serum values    Fluid Type-FALB CYTO PLEU     Comment: Performed at [facility], [address] [place], [place] [address]  Protein, pleural or peritoneal fluid     Status: None   Collection Time: 11/20/22  2:16 PM  Result Value Ref Range   Total protein, fluid 3.9 g/dL    Comment: (NOTE) No normal range established for this test Results should be evaluated in  conjunction with serum values    Fluid Type-FTP CYTO PLEU     Comment: Performed at [facility], [address] [place], [place] [address]  Lactate dehydrogenase     Status: None   Collection Time: 11/21/22 10:13 AM  Result Value Ref Range   LDH 183 98 - 192 U/L    Comment: Performed at [facility], [address] [place], [place] [address]  Comprehensive metabolic panel     Status: Abnormal   Collection Time: 11/21/22 10:13 AM  Result Value Ref Range   Sodium 139 135 - 145 mmol/L   Potassium 3.7 3.5 - 5.1 mmol/L   Chloride 101 98 - 111 mmol/L   CO2 29 22 - 32 mmol/L   Glucose,  Bld 106 (H) 70 - 99 mg/dL    Comment: Glucose reference range applies only to samples taken after fasting for at least 8 hours.   BUN 12 8 - 23 mg/dL   Creatinine, Ser 2.72 0.61 - 1.24 mg/dL   Calcium 9.2 8.9 - 53.6 mg/dL   Total Protein 7.3 6.5 - 8.1 g/dL   Albumin 3.8 3.5 - 5.0 g/dL   AST 16 15 - 41 U/L   ALT 12 0 - 44 U/L   Alkaline Phosphatase 58 38 - 126 U/L   Total Bilirubin 0.8 0.3 - 1.2 mg/dL   GFR, Estimated >64 >40 mL/min    Comment: (NOTE) Calculated using the CKD-EPI Creatinine Equation (2021)    Anion gap 9 5 - 15    Comment: Performed at [facility], [address] [place], [place] [address]  CBC with Differential/Platelet     Status: Abnormal   Collection Time: 11/21/22 10:13 AM  Result Value Ref Range   WBC 15.3 (H) 4.0 - 10.5 K/uL   RBC 5.36 4.22 - 5.81 MIL/uL   Hemoglobin 15.3 13.0 - 17.0 g/dL   HCT 59.5 63.8 - 75.6 %   MCV 85.3 80.0 - 100.0 fL   MCH 28.5 26.0 - 34.0 pg   MCHC 33.5 30.0 - 36.0 g/dL   RDW 43.3 29.5 - 18.8 %   Platelets 270 150 - 400 K/uL   nRBC 0.0 0.0 - 0.2 %   Neutrophils Relative % 88 %   Neutro Abs 13.6 (H) 1.7 - 7.7 K/uL   Lymphocytes Relative 5 %   Lymphs Abs 0.7 0.7 - 4.0 K/uL   Monocytes Relative 6 %   Monocytes Absolute 0.9 0.1 - 1.0 K/uL   Eosinophils Relative 0 %   Eosinophils Absolute 0.0 0.0 - 0.5 K/uL   Basophils  Relative 0 %   Basophils Absolute 0.0 0.0 - 0.1 K/uL   Immature Granulocytes 1 %   Abs Immature Granulocytes 0.09 (H) 0.00 - 0.07 K/uL    Comment: Performed at [facility], [address] [place], [place] [address]  Glucose, capillary     Status: Abnormal   Collection Time: 12/03/22  2:17 PM  Result Value Ref Range   Glucose-Capillary 101 (H) 70 - 99 mg/dL    Comment: Glucose reference range applies only to samples taken after fasting for at least 8 hours.  Protime-INR     Status: None   Collection Time: 12/11/22 11:07 AM  Result Value Ref Range   Prothrombin Time 13.6 11.4 - 15.2 seconds   INR 1.0 0.8 - 1.2    Comment: (NOTE) INR goal varies based on device and disease states. Performed at [facility], [address] [place], [place] [address]   APTT     Status: None   Collection Time: 12/11/22 11:07 AM  Result Value Ref Range   aPTT 32 24 - 36 seconds    Comment: Performed at [facility] [place], [address] [place], [place] [address]  Glucose, capillary     Status: Abnormal   Collection Time: 12/17/22 11:11 AM  Result Value Ref Range   Glucose-Capillary 132 (H) 70 - 99 mg/dL    Comment: Glucose reference range applies only to samples taken after fasting for at least 8 hours.  Cytology - Non PAP; right lung     Status: None   Collection Time: 12/17/22 12:46 PM  Result Value Ref Range   CYTOLOGY - NON GYN  CYTOLOGY - NON PAP CASE: [id number] PATIENT: Mark Mccarty Non-Gynecological Cytology Report     Specimen Submitted: A. Lung, right upper lobe; FNA  Clinical History: History of right lung adenocarcinoma with new hypermetabolic mass at the right hilum      DIAGNOSIS: A.  LUNG, RIGHT UPPER LOBE; ENB ASSISTED FNA: - POSITIVE FOR MALIGNANCY. - NON-SMALL CELL CARCINOMA IS PRESENT; SEE [id number].  The specimen is adequate for interpretation.  There is insufficient material for ancillary molecular testing.  See concurrent  cases [phone] and [id number].  GROSS DESCRIPTION: A. Site: Lung; right upper lobe Procedure: ENB Cytology specimen: FNA/needle biopsy, rinse Cytotechnologist(s): Monico Blitz, Olena Leatherwood Specimen material collected and submitted for: 1 diff Quik stained slides 1 Pap stained slides Specimen material submitted for: Cell block and ThinPrep  The cell block material is fixed in formalin for 6 hours prior to processing.  S pecimen description: Fixative: CytoLyt Volume: Approximately 20 mL Color: Colorless Transparency: Clear Tissue fragments present: No Collection brush(es) present: No  RB 12/17/2022  Final Diagnosis performed by Elijah Birk, MD.   Electronically signed 12/20/2022 2:26:48PM The electronic signature indicates that the named Attending Pathologist has evaluated the specimen Technical component performed at [facility], [address], [place], [place] [address] Lab: [phone] Dir: Jolene Schimke, MD, MMM  Professional component performed at [facility], [facility], [address] [place], [place], [place] [address] Lab: [phone] Dir: Beryle Quant, MD   Surgical pathology     Status: None   Collection Time: 12/17/22 12:47 PM  Result Value Ref Range   SURGICAL PATHOLOGY      SURGICAL PATHOLOGY CASE: [phone] PATIENT: Mark Mccarty Surgical Pathology Report     Specimen Submitted: A. Lung, right upper lobe  Clinical History: History of right lung adenocarcinoma with new hypermetabolic mass at the right hilum      DIAGNOSIS: A.  LUNG, RIGHT UPPER LOBE; ENB ASSISTED BIOPSY: - ADENOCARCINOMA, ACINAR AND SOLID PATTERNS.  There is sufficient material for ancillary molecular testing  See concurrent cases [phone] and [id number].  GROSS DESCRIPTION: A. Labeled: RUL biopsy Received: The specimen is collected during an ENB procedure and placed in formalin. Collection time: 1:18 PM on 12/17/2022 Placed into formalin time: 1:18 PM  on 12/17/2022 Tissue fragment(s): Multiple Size: Aggregate, 1.5 x 0.3 x 0.1 cm Description: Received are fragments of tan-pink soft tissue.  At the time of the procedure 2 Diff-Quik stained slides are performed. Entirely submitted in 1 cassette.  RB 12/17/2022   Final Diagnosis performed by Marlou Mccarty s, MD.   Electronically signed 12/20/2022 2:27:20PM The electronic signature indicates that the named Attending Pathologist has evaluated the specimen Technical component performed at [facility], [address], [place], [place] [address] Lab: [phone] Dir: Jolene Schimke, MD, MMM  Professional component performed at [facility], [facility], [address] [place], [place], [place] [address] Lab: [phone] Dir: Beryle Quant, MD   Cytology - Non PAP; station 7     Status: None   Collection Time: 12/17/22 12:49 PM  Result Value Ref Range   CYTOLOGY - NON GYN      CYTOLOGY - NON PAP CASE: [id number] PATIENT: Mark Mccarty Non-Gynecological Cytology Report     Specimen Submitted: A. Lung, right upper lobe; lavage  Clinical History: History of right lung adenocarcinoma with new hypermetabolic mass at the right hilum      DIAGNOSIS: A.  LUNG, RIGHT UPPER LOBE; BRONCHOSCOPY WITH LAVAGE: - POSITIVE FOR MALIGNANCY. - NON-SMALL CELL CARCINOMA IS PRESENT; SEE [id number].  The specimen is adequate for interpretation.  There is insufficient material for ancillary molecular testing.  See concurrent cases [phone] and [phone].  GROSS DESCRIPTION: A. Site: Lung; right upper lobe Procedure: Bronch Cytology specimen: Lavage Cytotechnologist(s): Monico Blitz, Olena Leatherwood Specimen material collected and submitted for: 0 diff Quik stained slides 0 Pap stained slides Specimen material submitted for: Cell block and ThinPrep  The cell block material is fixed in formalin for 6 hours prior to processing.  Speci men description: Fixative:  CytoLyt Volume: Approximately 50 mL Color: Colorless Transparency: Slightly cloudy Tissue fragments present: Yes Collection brush(es) present: No  RB 12/17/2022    Final Diagnosis performed by Elijah Birk, MD.   Electronically signed 12/20/2022 2:26:25PM The electronic signature indicates that the named Attending Pathologist has evaluated the specimen Technical component performed at [place], [address] [place], [place] [address] Lab: [phone] Dir: Jolene Schimke, MD, MMM  Professional component performed at [facility], [facility], [address] [place], [place], [place] [address] Lab: [phone] Dir: Beryle Quant, MD   Glucose, capillary     Status: Abnormal   Collection Time: 12/17/22  1:53 PM  Result Value Ref Range   Glucose-Capillary 150 (H) 70 - 99 mg/dL    Comment: Glucose reference range applies only to samples taken after fasting for at least 8 hours.   Comment 1 Notify RN    Comment 2 Document in Chart   Comprehensive metabolic panel     Status: Abnormal   Collection Time: 12/17/22  2:56 PM  Result Value Ref Range   Sodium 138 135 - 145 mmol/L   Potassium 3.6 3.5 - 5.1 mmol/L   Chloride 101 98 - 111 mmol/L   CO2 29 22 - 32 mmol/L   Glucose, Bld 141 (H) 70 - 99 mg/dL    Comment: Glucose reference range applies only to samples taken after fasting for at least 8 hours.   BUN 11 8 - 23 mg/dL   Creatinine, Ser 6.64 0.61 - 1.24 mg/dL   Calcium 8.1 (L) 8.9 - 10.3 mg/dL   Total Protein 6.4 (L) 6.5 - 8.1 g/dL   Albumin 3.3 (L) 3.5 - 5.0 g/dL   AST 16 15 - 41 U/L   ALT 13 0 - 44 U/L   Alkaline Phosphatase 58 38 - 126 U/L   Total Bilirubin 0.6 0.3 - 1.2 mg/dL   GFR, Estimated >40 >34 mL/min    Comment: (NOTE) Calculated using the CKD-EPI Creatinine Equation (2021)    Anion gap 8 5 - 15    Comment: Performed at [facility], [address] [place], [place] [address]  Glucose, capillary     Status: Abnormal   Collection Time: 12/17/22   9:14 PM  Result Value Ref Range   Glucose-Capillary 174 (H) 70 - 99 mg/dL    Comment: Glucose reference range applies only to samples taken after fasting for at least 8 hours.  CBC     Status: None   Collection Time: 12/17/22  9:36 PM  Result Value Ref Range   WBC 8.3 4.0 - 10.5 K/uL   RBC 5.26 4.22 - 5.81 MIL/uL   Hemoglobin 14.9 13.0 - 17.0 g/dL   HCT 56.3 87.5 - 64.3 %   MCV 85.6 80.0 - 100.0 fL   MCH 28.3 26.0 - 34.0 pg   MCHC 33.1 30.0 - 36.0 g/dL   RDW 32.9 51.8 - 84.1 %   Platelets 359 150 - 400 K/uL   nRBC 0.0 0.0 -  0.2 %    Comment: Performed at [facility], [address] [place], [place] [address]  Creatinine, serum     Status: None   Collection Time: 12/17/22  9:36 PM  Result Value Ref Range   Creatinine, Ser 0.81 0.61 - 1.24 mg/dL   GFR, Estimated >43 >32 mL/min    Comment: (NOTE) Calculated using the CKD-EPI Creatinine Equation (2021) Performed at [facility], [address] [place], [place] [address]   Protime-INR     Status: Abnormal   Collection Time: 12/18/22  4:32 AM  Result Value Ref Range   Prothrombin Time 15.7 (H) 11.4 - 15.2 seconds   INR 1.2 0.8 - 1.2    Comment: (NOTE) INR goal varies based on device and disease states. Performed at [facility], [address] [place], [place] [address]   APTT     Status: None   Collection Time: 12/18/22  4:32 AM  Result Value Ref Range   aPTT 31 24 - 36 seconds    Comment: Performed at [facility], [address] [place], [place] [address]  Rad Jeralene Huff Session Summary     Status: None   Collection Time: 12/25/22 10:02 AM  Result Value Ref Range   Course ID [email or web address]    Course Start Date 12/05/2022    Session Number 1    Course First Treatment Date 12/25/2022  9:59 AM    Course Last Treatment Date 12/25/2022 10:00 AM    Course Elapsed Days 0    Reference Point ID RLung DP    Reference Point Dosage Given to Date 2 Gy   Reference Point Session Dosage Given 2 Gy    Plan ID Lung_R    Plan Name Lung_Rt    Plan Fractions Treated to Date 1    Plan Total Fractions Prescribed 20    Plan Prescribed Dose Per Fraction 2 Gy   Plan Total Prescribed Dose 40.000000 Gy   Plan Primary Reference Point RLung DP   Rad Onc Aria Session Summary     Status: None   Collection Time: 12/26/22 10:17 AM  Result Value Ref Range   Course ID [email or web address]    Course Start Date 12/05/2022    Session Number 2    Course First Treatment Date 12/25/2022  9:59 AM    Course Last Treatment Date 12/26/2022 10:15 AM    Course Elapsed Days 1    Reference Point ID RLung DP    Reference Point Dosage Given to Date 4 Gy   Reference Point Session Dosage Given 2 Gy   Plan ID Lung_R    Plan Name Lung_Rt    Plan Fractions Treated to Date 2    Plan Total Fractions Prescribed 20    Plan Prescribed Dose Per Fraction 2 Gy   Plan Total Prescribed Dose 40.000000 Gy   Plan Primary Reference Point RLung DP   Rad Onc Aria Session Summary     Status: None   Collection Time: 12/27/22 10:02 AM  Result Value Ref Range   Course ID C3_Lung    Course Start Date 12/05/2022    Session Number 3    Course First Treatment Date 12/25/2022  9:59 AM    Course Last Treatment Date 12/27/2022  9:59 AM    Course Elapsed Days 2    Reference Point ID RLung DP    Reference Point Dosage Given to Date 6 Gy   Reference Point Session Dosage Given 2 Gy   Plan  ID Lung_R    Plan Name Lung_Rt    Plan Fractions Treated to Date 3    Plan Total Fractions Prescribed 20    Plan Prescribed Dose Per Fraction 2 Gy   Plan Total Prescribed Dose 40.000000 Gy   Plan Primary Reference Point RLung DP   Rad Onc Aria Session Summary     Status: None   Collection Time: 12/28/22 10:08 AM  Result Value Ref Range   Course ID [email or web address]    Course Start Date 12/05/2022    Session Number 4    Course First Treatment Date 12/25/2022  9:59 AM    Course Last Treatment Date 12/28/2022 10:06 AM    Course Elapsed Days 3    Reference Point ID RLung DP     Reference Point Dosage Given to Date 8 Gy   Reference Point Session Dosage Given 2 Gy   Plan ID Lung_R    Plan Name Lung_Rt    Plan Fractions Treated to Date 4    Plan Total Fractions Prescribed 20    Plan Prescribed Dose Per Fraction 2 Gy   Plan Total Prescribed Dose 40.000000 Gy   Plan Primary Reference Point RLung DP   Rad Onc Aria Session Summary     Status: None   Collection Time: 12/31/22 10:15 AM  Result Value Ref Range   Course ID C3_Lung    Course Start Date 12/05/2022    Session Number 5    Course First Treatment Date 12/25/2022  9:59 AM    Course Last Treatment Date 12/31/2022 10:12 AM    Course Elapsed Days 6    Reference Point ID RLung DP    Reference Point Dosage Given to Date 10 Gy   Reference Point Session Dosage Given 2 Gy   Plan ID Lung_R    Plan Name Lung_Rt    Plan Fractions Treated to Date 5    Plan Total Fractions Prescribed 20    Plan Prescribed Dose Per Fraction 2 Gy   Plan Total Prescribed Dose 40.000000 Gy   Plan Primary Reference Point RLung DP   Rad Onc Aria Session Summary     Status: None   Collection Time: 01/01/23  8:07 AM  Result Value Ref Range   Course ID C3_Lung    Course Start Date 12/05/2022    Session Number 6    Course First Treatment Date 12/25/2022  9:59 AM    Course Last Treatment Date 01/01/2023  8:06 AM    Course Elapsed Days 7    Reference Point ID RLung DP    Reference Point Dosage Given to Date 12 Gy   Reference Point Session Dosage Given 2 Gy   Plan ID Lung_R    Plan Name Lung_Rt    Plan Fractions Treated to Date 6    Plan Total Fractions Prescribed 20    Plan Prescribed Dose Per Fraction 2 Gy   Plan Total Prescribed Dose 40.000000 Gy   Plan Primary Reference Point RLung DP   CMP (Cancer Center only)     Status: Abnormal   Collection Time: 01/01/23  8:24 AM  Result Value Ref Range   Sodium 137 135 - 145 mmol/L   Potassium 4.7 3.5 - 5.1 mmol/L   Chloride 99 98 - 111 mmol/L   CO2 30 22 - 32 mmol/L   Glucose, Bld 135 (H) 70 -  99 mg/dL    Comment: Glucose reference range applies only to samples taken after fasting for at least 8 hours.  BUN 13 8 - 23 mg/dL   Creatinine 5.17 6.16 - 1.24 mg/dL   Calcium 9.0 8.9 - 07.3 mg/dL   Total Protein 6.9 6.5 - 8.1 g/dL   Albumin 3.3 (L) 3.5 - 5.0 g/dL   AST 13 (L) 15 - 41 U/L   ALT 11 0 - 44 U/L   Alkaline Phosphatase 59 38 - 126 U/L   Total Bilirubin 0.6 0.3 - 1.2 mg/dL   GFR, Estimated >71 >06 mL/min    Comment: (NOTE) Calculated using the CKD-EPI Creatinine Equation (2021)    Anion gap 8 5 - 15    Comment: Performed at [facility], [address] [place], [place] [address]  Protein, urine, random     Status: None   Collection Time: 01/01/23  8:24 AM  Result Value Ref Range   Total Protein, Urine 7 mg/dL    Comment: NO NORMAL RANGE ESTABLISHED FOR THIS TEST Performed at [facility], [address] [place], [place] [address]   CBC with Differential (Cancer Center Only)     Status: Abnormal   Collection Time: 01/01/23  8:25 AM  Result Value Ref Range   WBC Count 11.1 (H) 4.0 - 10.5 K/uL   RBC 5.15 4.22 - 5.81 MIL/uL   Hemoglobin 14.5 13.0 - 17.0 g/dL   HCT 03.5 00.9 - 38.1 %   MCV 85.0 80.0 - 100.0 fL   MCH 28.2 26.0 - 34.0 pg   MCHC 33.1 30.0 - 36.0 g/dL   RDW 82.9 93.7 - 16.9 %   Platelet Count 308 150 - 400 K/uL   nRBC 0.0 0.0 - 0.2 %   Neutrophils Relative % 85 %   Neutro Abs 9.5 (H) 1.7 - 7.7 K/uL   Lymphocytes Relative 5 %   Lymphs Abs 0.5 (L) 0.7 - 4.0 K/uL   Monocytes Relative 8 %   Monocytes Absolute 0.8 0.1 - 1.0 K/uL   Eosinophils Relative 2 %   Eosinophils Absolute 0.2 0.0 - 0.5 K/uL   Basophils Relative 0 %   Basophils Absolute 0.0 0.0 - 0.1 K/uL   Immature Granulocytes 0 %   Abs Immature Granulocytes 0.04 0.00 - 0.07 K/uL    Comment: Performed at [facility], [address] [place], [place] [address]  Rad Jeralene Huff Session Summary     Status: None   Collection Time: 01/02/23 10:06 AM  Result Value Ref Range    Course ID [email or web address]    Course Start Date 12/05/2022    Session Number 7    Course First Treatment Date 12/25/2022  9:59 AM    Course Last Treatment Date 01/02/2023 10:04 AM    Course Elapsed Days 8    Reference Point ID RLung DP    Reference Point Dosage Given to Date 14 Gy   Reference Point Session Dosage Given 2 Gy   Plan ID Lung_R    Plan Name Lung_Rt    Plan Fractions Treated to Date 7    Plan Total Fractions Prescribed 20    Plan Prescribed Dose Per Fraction 2 Gy   Plan Total Prescribed Dose 40.000000 Gy   Plan Primary Reference Point RLung DP   Rad Onc Aria Session Summary     Status: None   Collection Time: 01/04/23 10:07 AM  Result Value Ref Range   Course ID C3_Lung    Course Start Date 12/05/2022    Session Number 8    Course First Treatment Date 12/25/2022  9:59 AM  Course Last Treatment Date 01/04/2023 10:05 AM    Course Elapsed Days 10    Reference Point ID RLung DP    Reference Point Dosage Given to Date 16 Gy   Reference Point Session Dosage Given 2 Gy   Plan ID Lung_R    Plan Name Lung_Rt    Plan Fractions Treated to Date 8    Plan Total Fractions Prescribed 20    Plan Prescribed Dose Per Fraction 2 Gy   Plan Total Prescribed Dose 40.000000 Gy   Plan Primary Reference Point RLung DP   Rad Onc Aria Session Summary     Status: None   Collection Time: 01/07/23 10:02 AM  Result Value Ref Range   Course ID C3_Lung    Course Start Date 12/05/2022    Session Number 9    Course First Treatment Date 12/25/2022  9:59 AM    Course Last Treatment Date 01/07/2023 10:00 AM    Course Elapsed Days 13    Reference Point ID RLung DP    Reference Point Dosage Given to Date 18 Gy   Reference Point Session Dosage Given 2 Gy   Plan ID Lung_R    Plan Name Lung_Rt    Plan Fractions Treated to Date 9    Plan Total Fractions Prescribed 20    Plan Prescribed Dose Per Fraction 2 Gy   Plan Total Prescribed Dose 40.000000 Gy   Plan Primary Reference Point RLung DP   Rad Onc Aria Session  Summary     Status: None   Collection Time: 01/08/23 10:09 AM  Result Value Ref Range   Course ID C3_Lung    Course Start Date 12/05/2022    Session Number 10    Course First Treatment Date 12/25/2022  9:59 AM    Course Last Treatment Date 01/08/2023 10:07 AM    Course Elapsed Days 14    Reference Point ID RLung DP    Reference Point Dosage Given to Date 20 Gy   Reference Point Session Dosage Given 2 Gy   Plan ID Lung_R    Plan Name Lung_Rt    Plan Fractions Treated to Date 10    Plan Total Fractions Prescribed 20    Plan Prescribed Dose Per Fraction 2 Gy   Plan Total Prescribed Dose 40.000000 Gy   Plan Primary Reference Point RLung DP   CMP (Cancer Center only)     Status: Abnormal   Collection Time: 01/08/23 10:36 AM  Result Value Ref Range   Sodium 136 135 - 145 mmol/L   Potassium 4.0 3.5 - 5.1 mmol/L   Chloride 99 98 - 111 mmol/L   CO2 29 22 - 32 mmol/L   Glucose, Bld 127 (H) 70 - 99 mg/dL    Comment: Glucose reference range applies only to samples taken after fasting for at least 8 hours.   BUN 14 8 - 23 mg/dL   Creatinine 9.60 4.54 - 1.24 mg/dL   Calcium 8.9 8.9 - 09.8 mg/dL   Total Protein 6.8 6.5 - 8.1 g/dL   Albumin 3.2 (L) 3.5 - 5.0 g/dL   AST 19 15 - 41 U/L   ALT 18 0 - 44 U/L   Alkaline Phosphatase 59 38 - 126 U/L   Total Bilirubin 0.4 0.3 - 1.2 mg/dL   GFR, Estimated >11 >91 mL/min    Comment: (NOTE) Calculated using the CKD-EPI Creatinine Equation (2021)    Anion gap 8 5 - 15    Comment: Performed at [facility], [address] [place], [place] [address]  CBC with Differential (Cancer Center Only)     Status: Abnormal   Collection Time: 01/08/23 10:36 AM  Result Value Ref Range   WBC Count 4.9 4.0 - 10.5 K/uL   RBC 5.27 4.22 - 5.81 MIL/uL   Hemoglobin 14.7 13.0 - 17.0 g/dL   HCT 62.9 52.8 - 41.3 %   MCV 84.8 80.0 - 100.0 fL   MCH 27.9 26.0 - 34.0 pg   MCHC 32.9 30.0 - 36.0 g/dL   RDW 24.4 01.0 - 27.2 %   Platelet Count 267 150 - 400 K/uL   nRBC  0.0 0.0 - 0.2 %   Neutrophils Relative % 86 %   Neutro Abs 4.2 1.7 - 7.7 K/uL   Lymphocytes Relative 6 %   Lymphs Abs 0.3 (L) 0.7 - 4.0 K/uL   Monocytes Relative 6 %   Monocytes Absolute 0.3 0.1 - 1.0 K/uL   Eosinophils Relative 1 %   Eosinophils Absolute 0.0 0.0 - 0.5 K/uL   Basophils Relative 0 %   Basophils Absolute 0.0 0.0 - 0.1 K/uL   Immature Granulocytes 1 %   Abs Immature Granulocytes 0.03 0.00 - 0.07 K/uL    Comment: Performed at [facility] [address] [place], [place] [address]  Glucose, capillary     Status: None   Collection Time: 01/08/23 12:29 PM  Result Value Ref Range   Glucose-Capillary 99 70 - 99 mg/dL    Comment: Glucose reference range applies only to samples taken after fasting for at least 8 hours.  Rad Jeralene Huff Session Summary     Status: None   Collection Time: 01/09/23 10:04 AM  Result Value Ref Range   Course ID C3_Lung    Course Start Date 12/05/2022    Session Number 11    Course First Treatment Date 12/25/2022  9:59 AM    Course Last Treatment Date 01/09/2023 10:02 AM    Course Elapsed Days 15    Reference Point ID RLung DP    Reference Point Dosage Given to Date 22 Gy   Reference Point Session Dosage Given 2 Gy   Plan ID Lung_R    Plan Name Lung_Rt    Plan Fractions Treated to Date 11    Plan Total Fractions Prescribed 20    Plan Prescribed Dose Per Fraction 2 Gy   Plan Total Prescribed Dose 40.000000 Gy   Plan Primary Reference Point RLung DP   Rad Onc Aria Session Summary     Status: None   Collection Time: 01/10/23  9:52 AM  Result Value Ref Range   Course ID C3_Lung    Course Start Date 12/05/2022    Session Number 12    Course First Treatment Date 12/25/2022  9:59 AM    Course Last Treatment Date 01/10/2023  9:50 AM    Course Elapsed Days 16    Reference Point ID RLung DP    Reference Point Dosage Given to Date 24 Gy   Reference Point Session Dosage Given 2 Gy   Plan ID Lung_R    Plan Name Lung_Rt    Plan Fractions Treated  to Date 12    Plan Total Fractions Prescribed 20    Plan Prescribed Dose Per Fraction 2 Gy   Plan Total Prescribed Dose 40.000000 Gy   Plan Primary Reference Point RLung DP   Rad Onc Aria Session Summary     Status: None   Collection Time: 01/14/23 10:08  AM  Result Value Ref Range   Course ID C3_Lung    Course Start Date 12/05/2022    Session Number 13    Course First Treatment Date 12/25/2022  9:59 AM    Course Last Treatment Date 01/14/2023 10:06 AM    Course Elapsed Days 20    Reference Point ID RLung DP    Reference Point Dosage Given to Date 26 Gy   Reference Point Session Dosage Given 2 Gy   Plan ID Lung_R    Plan Name Lung_Rt    Plan Fractions Treated to Date 13    Plan Total Fractions Prescribed 20    Plan Prescribed Dose Per Fraction 2 Gy   Plan Total Prescribed Dose 40.000000 Gy   Plan Primary Reference Point RLung DP   Rad Onc Aria Session Summary     Status: None   Collection Time: 01/15/23  9:29 AM  Result Value Ref Range   Course ID C3_Lung    Course Start Date 12/05/2022    Session Number 14    Course First Treatment Date 12/25/2022  9:59 AM    Course Last Treatment Date 01/15/2023  9:27 AM    Course Elapsed Days 21    Reference Point ID RLung DP    Reference Point Dosage Given to Date 28 Gy   Reference Point Session Dosage Given 2 Gy   Plan ID Lung_R    Plan Name Lung_Rt    Plan Fractions Treated to Date 14    Plan Total Fractions Prescribed 20    Plan Prescribed Dose Per Fraction 2 Gy   Plan Total Prescribed Dose 40.000000 Gy   Plan Primary Reference Point RLung DP   Rad Onc Aria Session Summary     Status: None   Collection Time: 01/16/23  9:59 AM  Result Value Ref Range   Course ID C3_Lung    Course Start Date 12/05/2022    Session Number 15    Course First Treatment Date 12/25/2022  9:59 AM    Course Last Treatment Date 01/16/2023  9:57 AM    Course Elapsed Days 22    Reference Point ID RLung DP    Reference Point Dosage Given to Date 30 Gy   Reference  Point Session Dosage Given 2 Gy   Plan ID Lung_R    Plan Name Lung_Rt    Plan Fractions Treated to Date 15    Plan Total Fractions Prescribed 20    Plan Prescribed Dose Per Fraction 2 Gy   Plan Total Prescribed Dose 40.000000 Gy   Plan Primary Reference Point RLung DP   CMP (Cancer Center only)     Status: Abnormal   Collection Time: 01/16/23 10:16 AM  Result Value Ref Range   Sodium 135 135 - 145 mmol/L   Potassium 4.1 3.5 - 5.1 mmol/L   Chloride 96 (L) 98 - 111 mmol/L   CO2 31 22 - 32 mmol/L   Glucose, Bld 140 (H) 70 - 99 mg/dL    Comment: Glucose reference range applies only to samples taken after fasting for at least 8 hours.   BUN 19 8 - 23 mg/dL   Creatinine 1.61 0.96 - 1.24 mg/dL   Calcium 8.5 (L) 8.9 - 10.3 mg/dL   Total Protein 6.3 (L) 6.5 - 8.1 g/dL   Albumin 3.3 (L) 3.5 - 5.0 g/dL   AST 21 15 - 41 U/L   ALT 31 0 - 44 U/L   Alkaline Phosphatase 58 38 - 126 U/L  Total Bilirubin 0.2 (L) 0.3 - 1.2 mg/dL   GFR, Estimated >16 >10 mL/min    Comment: (NOTE) Calculated using the CKD-EPI Creatinine Equation (2021)    Anion gap 8 5 - 15    Comment: Performed at [facility], [address] [place], [place] [address]  CBC with Differential (Cancer Center Only)     Status: Abnormal   Collection Time: 01/16/23 10:16 AM  Result Value Ref Range   WBC Count 4.4 4.0 - 10.5 K/uL   RBC 5.13 4.22 - 5.81 MIL/uL   Hemoglobin 14.3 13.0 - 17.0 g/dL   HCT 40.9 81.1 - 91.4 %   MCV 84.6 80.0 - 100.0 fL   MCH 27.9 26.0 - 34.0 pg   MCHC 32.9 30.0 - 36.0 g/dL   RDW 78.2 95.6 - 21.3 %   Platelet Count 282 150 - 400 K/uL   nRBC 0.0 0.0 - 0.2 %   Neutrophils Relative % 84 %   Neutro Abs 3.7 1.7 - 7.7 K/uL   Lymphocytes Relative 4 %   Lymphs Abs 0.2 (L) 0.7 - 4.0 K/uL   Monocytes Relative 10 %   Monocytes Absolute 0.4 0.1 - 1.0 K/uL   Eosinophils Relative 0 %   Eosinophils Absolute 0.0 0.0 - 0.5 K/uL   Basophils Relative 1 %   Basophils Absolute 0.0 0.0 - 0.1 K/uL   Immature  Granulocytes 1 %   Abs Immature Granulocytes 0.04 0.00 - 0.07 K/uL    Comment: Performed at [facility], [address] [place], [place] [address]  Rad Jeralene Huff Session Summary     Status: None   Collection Time: 01/17/23 10:04 AM  Result Value Ref Range   Course ID [email or web address]    Course Start Date 12/05/2022    Session Number 16    Course First Treatment Date 12/25/2022  9:59 AM    Course Last Treatment Date 01/17/2023 10:02 AM    Course Elapsed Days 23    Reference Point ID RLung DP    Reference Point Dosage Given to Date 32 Gy   Reference Point Session Dosage Given 2 Gy   Plan ID Lung_R    Plan Name Lung_Rt    Plan Fractions Treated to Date 16    Plan Total Fractions Prescribed 20    Plan Prescribed Dose Per Fraction 2 Gy   Plan Total Prescribed Dose 40.000000 Gy   Plan Primary Reference Point RLung DP   ECHO TEE     Status: None   Collection Time: 01/17/23 12:25 PM  Result Value Ref Range   Est EF 55 - 60%     Radiology ECHO TEE  Result Date: 01/17/2023    TRANSESOPHOGEAL ECHO REPORT   Patient Name:   Mark Mccarty Date of Exam: 01/17/2023 Medical Rec #:  [id number]         Height:       72.0 in Accession #:    [id number]        Weight:       207.9 lb Date of Birth:  [birth year]         BSA:          2.166 m Patient Age:    72 years          BP:           122/82 mmHg Patient Gender: M                 HR:  77 bpm. Exam Location:  [facility] Procedure: Transesophageal Echo, Cardiac Doppler and Color Doppler Indications:     Not listed on TEE check-in sheet  History:         Patient has no prior history of Echocardiogram examinations.  Sonographer:     Cristela Blue Referring Phys:  [id number] [facility] CUSTOVIC Diagnosing Phys: Rozell Searing Custovic PROCEDURE: The transesophogeal probe was passed without difficulty through the esophogus of the patient. Sedation performed by different physician. The patient developed no complications during the procedure.  IMPRESSIONS  1. Left ventricular  ejection fraction, by estimation, is 55 to 60%. The left ventricle has normal function.  2. Right ventricular systolic function was not well visualized. The right ventricular size is not well visualized.  3. No left atrial/left atrial appendage thrombus was detected.  4. The mitral valve was not assessed. No evidence of mitral valve regurgitation.  5. The aortic valve was not assessed. Aortic valve regurgitation is not visualized. Conclusion(s)/Recommendation(s): Normal biventricular function without evidence of hemodynamically significant valvular heart disease. No LA/LAA thrombus identified. Successful cardioversion performed with restoration of normal sinus rhythm. FINDINGS  Left Ventricle: Left ventricular ejection fraction, by estimation, is 55 to 60%. The left ventricle has normal function. The left ventricular internal cavity size was normal in size. Right Ventricle: The right ventricular size is not well visualized. Right vetricular wall thickness was not assessed. Right ventricular systolic function was not well visualized. Left Atrium: Left atrial size was normal in size. No left atrial/left atrial appendage thrombus was detected. Right Atrium: Right atrial size was not assessed. Pericardium: The pericardium was not assessed. Mitral Valve: The mitral valve was not assessed. No evidence of mitral valve regurgitation. Tricuspid Valve: The tricuspid valve is not assessed. Tricuspid valve regurgitation is not demonstrated. Aortic Valve: The aortic valve was not assessed. Aortic valve regurgitation is not visualized. Pulmonic Valve: The pulmonic valve was not assessed. Pulmonic valve regurgitation is not visualized. Aorta: Aortic root could not be assessed. IAS/Shunts: The interatrial septum was not assessed. Clotilde Dieter Electronically signed by Clotilde Dieter Signature Date/Time: 01/17/2023/1:54:22 PM    Final    DG Chest Port 1 View  Result Date: 01/10/2023 CLINICAL DATA:  Right-sided thoracentesis.  EXAM: PORTABLE CHEST 1 VIEW COMPARISON:  One-view chest x-ray 01/09/2023 FINDINGS: The heart is enlarged.  A right IJ Port-A-Cath is stable. The right pleural effusion has decreased. No pneumothorax is present. No significant left-sided effusion is present. A left perihilar density is again noted. IMPRESSION: 1. Interval decrease in right pleural effusion without pneumothorax. 2. Stable left perihilar density. 3. Cardiomegaly without failure. Electronically Signed   By: Marin Roberts M.D.   On: 01/10/2023 16:25   US THORACENTESIS ASP PLEURAL SPACE W/IMG GUIDE  Result Date: 01/10/2023 INDICATION: Patient with history of right lung cancer and recurrent symptomatic pleural effusion request received for therapeutic thoracentesis. EXAM: ULTRASOUND GUIDED RIGHT THORACENTESIS MEDICATIONS: Local 1% lidocaine only. COMPLICATIONS: None immediate. PROCEDURE: An ultrasound guided thoracentesis was thoroughly discussed with the patient and questions answered. The benefits, risks, alternatives and complications were also discussed. The patient understands and wishes to proceed with the procedure. Written consent was obtained. Ultrasound was performed to localize and mark an adequate pocket of fluid in the right chest. The area was then prepped and draped in the normal sterile fashion. 1% Lidocaine was used for local anesthesia. Under ultrasound guidance a 19 gauge, 7-cm, Yueh catheter was introduced. Thoracentesis was performed. The catheter was removed and a dressing applied. FINDINGS: A total of  approximately 2 L of serosanguineous fluid was removed. IMPRESSION: Successful ultrasound guided right thoracentesis yielding 2 L of pleural fluid. This exam was performed by Pattricia Boss PA-C, and was supervised and interpreted by Dr. Juliette Alcide. Electronically Signed   By: Olive Bass M.D.   On: 01/10/2023 16:13   DG Chest 2 View  Result Date: 01/09/2023 CLINICAL DATA:  Shortness of breath. Primary lung adenocarcinoma,  rate EXAM: CHEST - 2 VIEW COMPARISON:  Chest radiographs 12/28/2022 and 12/25/2022; CT 12/14/2022 FINDINGS: New right chest wall porta catheter with tip overlying the superior vena cava. The mid aspect of this catheter curls approximately 2.5 cm superiorly at the right neck base before turning inferiorly and extending into the superior vena cava. Interval increase in homogeneous opacity indicating pleural effusion involving the inferior 60% of the right hemithorax compared to 12/28/2022, now more similar to 12/25/2022. There is again homogeneous opacification from right upper lobe collapse. There is again an approximate 2-3 cm faint density overlying the left midlung corresponding to the nodule better seen on prior CT within the superior segment of the left lower lobe on prior CT. No left pleural effusion. No pneumothorax. Visualized left side of the cardiac silhouette and mediastinal contours are unremarkable. Mild multilevel degenerative disc changes of the thoracic spine. IMPRESSION: 1. New right chest wall porta catheter with tip overlying the superior vena cava. The mid aspect of this catheter curls approximately 2.5 cm superiorly at the right neck base, likely into the right internal jugular vein before turning inferiorly and extending into the superior vena cava. 2. Interval increase in homogeneous opacity indicating moderate-to-large pleural effusion involving the inferior 60% of the right hemithorax compared to 12/28/2022, now more similar to 12/25/2022. There also may be right lower lobe collapse, as seen on 12/14/2022 CT. 3. There is again homogeneous opacification of the superomedial right hemithorax from right upper lobe collapse. 4. There is again an approximate 2-3 cm faint density overlying the left midlung corresponding to the nodule better seen on prior CT within the superior segment of the left lower lobe on prior CT. Electronically Signed   By: Neita Garnet M.D.   On: 01/09/2023 12:38    PERIPHERAL VASCULAR CATHETERIZATION  Result Date: 01/08/2023 See surgical note for result.  US THORACENTESIS ASP PLEURAL SPACE W/IMG GUIDE  Result Date: 12/28/2022 INDICATION: Right pleural effusion EXAM: ULTRASOUND GUIDED RIGHT THORACENTESIS MEDICATIONS: None. COMPLICATIONS: None immediate. PROCEDURE: An ultrasound guided thoracentesis was thoroughly discussed with the patient and questions answered. The benefits, risks, alternatives and complications were also discussed. The patient understands and wishes to proceed with the procedure. Written consent was obtained. Ultrasound was performed to localize and mark an adequate pocket of fluid in the right chest. The area was then prepped and draped in the normal sterile fashion. 1% Lidocaine was used for local anesthesia. Under ultrasound guidance a 19 gauge, 7-cm, Yueh catheter was introduced. Thoracentesis was performed. The catheter was removed and a dressing applied. FINDINGS: A total of approximately 2.0 L of thin serosanguineous fluid was removed. IMPRESSION: Successful ultrasound guided right thoracentesis yielding 2.0 L of serosanguineous pleural fluid. Electronically Signed   By: Olive Bass M.D.   On: 12/28/2022 15:30   DG Chest 2 View  Result Date: 12/28/2022 CLINICAL DATA:  Right pleural effusion EXAM: CHEST - 2 VIEW COMPARISON:  Previous studies including the examination of 12/18/2022 FINDINGS: There is a interval increase in size of large right pleural effusion. There is opacification of right mid and right lower lung  fields. Homogeneous density in the medial right upper lung field corresponds to her case is seen right upper lobe. There is faint 2.1 cm nodular density in left parahilar region corresponding to the lung nodule in left lower lobe seen in previous CT. Scarring is seen in the left apex. IMPRESSION: There is large right pleural effusion with interval increase. Homogeneous opacity in the medial right upper lung field corresponds  to atelectasis seen in previous CT. There is 2.1 cm nodular density in the left mid lung field corresponding to a nodular density in left lower lobe seen in previous CT. Electronically Signed   By: Ernie Avena M.D.   On: 12/28/2022 14:49   DG Chest Port 1 View  Result Date: 12/28/2022 CLINICAL DATA:  Right pleural effusion, status post thoracentesis EXAM: PORTABLE CHEST 1 VIEW COMPARISON:  Previous studies including the examination of 12/25/2022 FINDINGS: Transverse diameter of heart is increased. There is homogeneous opacity in the medial right upper lung field consistent with previously described atelectasis in right upper lobe. There is decreasing large right pleural effusion. There is no pneumothorax. Left lung is clear. Evaluation of right lower lung field is limited by the effusion. IMPRESSION: There is decrease in amount of right pleural effusion after thoracentesis. There is no pneumothorax. Electronically Signed   By: Ernie Avena M.D.   On: 12/28/2022 14:44    Assessment/Plan  No problem-specific Assessment & Plan notes found for this encounter.    Festus Barren, MD  01/18/2023 11:17 AM    This note was created with Dragon medical transcription system.  Any errors from dictation are purely unintentional

## 2023-01-21 ENCOUNTER — Other Ambulatory Visit: Payer: Self-pay

## 2023-01-21 ENCOUNTER — Ambulatory Visit: Payer: Medicare Other

## 2023-01-21 ENCOUNTER — Ambulatory Visit
Admission: RE | Admit: 2023-01-21 | Discharge: 2023-01-21 | Disposition: A | Discharge status: Home / Self Care | Payer: Medicare Other | Source: Ambulatory Visit | Attending: Radiation Oncology | Admitting: Radiation Oncology

## 2023-01-21 DIAGNOSIS — Z510 Encounter for antineoplastic radiation therapy: Secondary | ICD-10-CM | POA: Diagnosis not present

## 2023-01-21 LAB — RAD ONC ARIA SESSION SUMMARY
Course Elapsed Days: 27
Plan Fractions Treated to Date: 18
Plan Prescribed Dose Per Fraction: 2 Gy
Plan Total Fractions Prescribed: 20
Plan Total Prescribed Dose: 40 Gy
Reference Point Dosage Given to Date: 36 Gy
Reference Point Session Dosage Given: 2 Gy
Session Number: 18

## 2023-01-21 MED FILL — Fosaprepitant Dimeglumine For IV Infusion 150 MG (Base Eq): INTRAVENOUS | Qty: 5 | Status: AC

## 2023-01-21 MED FILL — Dexamethasone Sodium Phosphate Inj 100 MG/10ML: INTRAMUSCULAR | Qty: 1 | Status: AC

## 2023-01-22 ENCOUNTER — Inpatient Hospital Stay: Payer: Medicare Other

## 2023-01-22 ENCOUNTER — Ambulatory Visit: Payer: Medicare Other

## 2023-01-22 ENCOUNTER — Ambulatory Visit
Admission: RE | Admit: 2023-01-22 | Discharge: 2023-01-22 | Disposition: A | Discharge status: Home / Self Care | Payer: Medicare Other | Source: Ambulatory Visit | Attending: Oncology | Admitting: Oncology

## 2023-01-22 ENCOUNTER — Ambulatory Visit
Admission: RE | Admit: 2023-01-22 | Discharge: 2023-01-22 | Disposition: A | Discharge status: Home / Self Care | Payer: Medicare Other | Source: Ambulatory Visit | Attending: Radiation Oncology | Admitting: Radiation Oncology

## 2023-01-22 ENCOUNTER — Encounter: Payer: Self-pay | Admitting: Oncology

## 2023-01-22 ENCOUNTER — Inpatient Hospital Stay (HOSPITAL_BASED_OUTPATIENT_CLINIC_OR_DEPARTMENT_OTHER): Payer: Medicare Other | Admitting: Oncology

## 2023-01-22 ENCOUNTER — Other Ambulatory Visit: Payer: Self-pay

## 2023-01-22 VITALS — BP 144/84 | HR 75 | Temp 96.6°F | Resp 18 | Wt 205.6 lb

## 2023-01-22 VITALS — BP 136/80 | HR 84 | Resp 16

## 2023-01-22 DIAGNOSIS — Z95828 Presence of other vascular implants and grafts: Secondary | ICD-10-CM

## 2023-01-22 DIAGNOSIS — J90 Pleural effusion, not elsewhere classified: Secondary | ICD-10-CM

## 2023-01-22 DIAGNOSIS — C3491 Malignant neoplasm of unspecified part of right bronchus or lung: Secondary | ICD-10-CM

## 2023-01-22 DIAGNOSIS — F411 Generalized anxiety disorder: Secondary | ICD-10-CM | POA: Diagnosis not present

## 2023-01-22 DIAGNOSIS — C801 Malignant (primary) neoplasm, unspecified: Secondary | ICD-10-CM

## 2023-01-22 DIAGNOSIS — Z510 Encounter for antineoplastic radiation therapy: Secondary | ICD-10-CM | POA: Diagnosis not present

## 2023-01-22 DIAGNOSIS — Z5111 Encounter for antineoplastic chemotherapy: Secondary | ICD-10-CM

## 2023-01-22 DIAGNOSIS — R634 Abnormal weight loss: Secondary | ICD-10-CM

## 2023-01-22 LAB — RAD ONC ARIA SESSION SUMMARY
Course Elapsed Days: 28
Plan Fractions Treated to Date: 19
Plan Prescribed Dose Per Fraction: 2 Gy
Plan Total Fractions Prescribed: 20
Plan Total Prescribed Dose: 40 Gy
Reference Point Dosage Given to Date: 38 Gy
Reference Point Session Dosage Given: 2 Gy
Session Number: 19

## 2023-01-22 LAB — CBC WITH DIFFERENTIAL (CANCER CENTER ONLY)
Abs Immature Granulocytes: 0.05 10*3/uL (ref 0.00–0.07)
Basophils Absolute: 0 10*3/uL (ref 0.0–0.1)
Basophils Relative: 0 %
Eosinophils Absolute: 0 10*3/uL (ref 0.0–0.5)
Eosinophils Relative: 0 %
HCT: 46 % (ref 39.0–52.0)
Hemoglobin: 14.7 g/dL (ref 13.0–17.0)
Immature Granulocytes: 1 %
Lymphocytes Relative: 2 %
Lymphs Abs: 0.2 10*3/uL — ABNORMAL LOW (ref 0.7–4.0)
MCH: 27.7 pg (ref 26.0–34.0)
MCHC: 32 g/dL (ref 30.0–36.0)
MCV: 86.6 fL (ref 80.0–100.0)
Monocytes Absolute: 0.5 10*3/uL (ref 0.1–1.0)
Monocytes Relative: 4 %
Neutro Abs: 10.1 10*3/uL — ABNORMAL HIGH (ref 1.7–7.7)
Neutrophils Relative %: 93 %
Platelet Count: 144 10*3/uL — ABNORMAL LOW (ref 150–400)
RBC: 5.31 MIL/uL (ref 4.22–5.81)
RDW: 14.3 % (ref 11.5–15.5)
WBC Count: 10.8 10*3/uL — ABNORMAL HIGH (ref 4.0–10.5)
nRBC: 0 % (ref 0.0–0.2)

## 2023-01-22 LAB — CMP (CANCER CENTER ONLY)
ALT: 24 U/L (ref 0–44)
AST: 17 U/L (ref 15–41)
Albumin: 3.1 g/dL — ABNORMAL LOW (ref 3.5–5.0)
Alkaline Phosphatase: 58 U/L (ref 38–126)
Anion gap: 8 (ref 5–15)
BUN: 13 mg/dL (ref 8–23)
CO2: 30 mmol/L (ref 22–32)
Calcium: 8.6 mg/dL — ABNORMAL LOW (ref 8.9–10.3)
Chloride: 100 mmol/L (ref 98–111)
Creatinine: 0.81 mg/dL (ref 0.61–1.24)
GFR, Estimated: >60 mL/min (ref >60)
Glucose, Bld: 105 mg/dL — ABNORMAL HIGH (ref 70–99)
Potassium: 4.3 mmol/L (ref 3.5–5.1)
Sodium: 138 mmol/L (ref 135–145)
Total Bilirubin: 0.4 mg/dL (ref 0.3–1.2)
Total Protein: 6.2 g/dL — ABNORMAL LOW (ref 6.5–8.1)

## 2023-01-22 LAB — PROTEIN, URINE, RANDOM: Total Protein, Urine: 8 mg/dL

## 2023-01-22 MED ORDER — SODIUM CHLORIDE 0.9 % IV SOLN
585.0000 mg | Freq: Once | INTRAVENOUS | Status: AC
  Administered 2023-01-22: 590 mg via INTRAVENOUS
  Filled 2023-01-22: qty 59

## 2023-01-22 MED ORDER — SODIUM CHLORIDE 0.9 % IV SOLN
150.0000 mg | Freq: Once | INTRAVENOUS | Status: AC
  Administered 2023-01-22: 150 mg via INTRAVENOUS
  Filled 2023-01-22: qty 150

## 2023-01-22 MED ORDER — FAMOTIDINE IN NACL 20-0.9 MG/50ML-% IV SOLN
20.0000 mg | Freq: Once | INTRAVENOUS | Status: AC
  Administered 2023-01-22: 20 mg via INTRAVENOUS
  Filled 2023-01-22: qty 50

## 2023-01-22 MED ORDER — DIPHENHYDRAMINE HCL 50 MG/ML IJ SOLN
50.0000 mg | Freq: Once | INTRAMUSCULAR | Status: AC
  Administered 2023-01-22: 50 mg via INTRAVENOUS
  Filled 2023-01-22: qty 1

## 2023-01-22 MED ORDER — SODIUM CHLORIDE 0.9 % IV SOLN
Freq: Once | INTRAVENOUS | Status: AC
  Filled 2023-01-22: qty 250

## 2023-01-22 MED ORDER — PALONOSETRON HCL INJECTION 0.25 MG/5ML
0.2500 mg | Freq: Once | INTRAVENOUS | Status: AC
  Administered 2023-01-22: 0.25 mg via INTRAVENOUS
  Filled 2023-01-22: qty 5

## 2023-01-22 MED ORDER — SODIUM CHLORIDE 0.9 % IV SOLN
10.0000 mg | Freq: Once | INTRAVENOUS | Status: AC
  Administered 2023-01-22: 10 mg via INTRAVENOUS
  Filled 2023-01-22: qty 10

## 2023-01-22 MED ORDER — HEPARIN SOD (PORK) LOCK FLUSH 100 UNIT/ML IV SOLN
500.0000 [IU] | Freq: Once | INTRAVENOUS | Status: DC
  Filled 2023-01-22: qty 5

## 2023-01-22 MED ORDER — SODIUM CHLORIDE 0.9 % IV SOLN
150.0000 mg/m2 | Freq: Once | INTRAVENOUS | Status: AC
  Administered 2023-01-22: 330 mg via INTRAVENOUS
  Filled 2023-01-22: qty 55

## 2023-01-22 MED ORDER — SODIUM CHLORIDE 0.9 % IV SOLN
15.0000 mg/kg | Freq: Once | INTRAVENOUS | Status: AC
  Administered 2023-01-22: 1400 mg via INTRAVENOUS
  Filled 2023-01-22: qty 8

## 2023-01-22 MED ORDER — SODIUM CHLORIDE 0.9% FLUSH
10.0000 mL | Freq: Once | INTRAVENOUS | Status: DC
  Filled 2023-01-22: qty 10

## 2023-01-22 NOTE — Progress Notes (Signed)
Pt here for follow up. Pt reports that he had a follow up with Dr. Wyn Quaker last week regarding port and he was told that it would be a little while before port can be used. Pt also states that he feels like he does not need 02 continuously.

## 2023-01-22 NOTE — Progress Notes (Signed)
Hematology/Oncology Progress note [phone] [phone]       Patient Care Team: [name], NP as PCP - General (Family Medicine) [name], RN as Registered Nurse [name], MD as Consulting Physician (Oncology) [name], MD as Referring Physician (Radiation Oncology) Wyn Quaker Marlow Baars, MD as Referring Physician (Vascular Surgery)   REASON FOR VISIT Follow up for immunotherapy treatment for stage IIIA right lung adenocarcinoma, and presumed stage I left lung cancer   ASSESSMENT & PLAN:   Cancer Staging  Primary lung adenocarcinoma, right [facility]) Staging form: Lung, AJCC 8th Edition - Clinical stage from 12/13/2017: Stage IIIA (cT3, cN1, cM0) - Signed by [name], MD on 12/13/2017 - Pathologic stage from 12/24/2022: Stage IV (pTX, pNX, pM1) - Signed by [name], MD on 12/24/2022   Primary lung adenocarcinoma, right (HCC) #History of stage IIIA cT3 N1 M0 lung adenocarcinoma, s/p concurrent chemoradiation [Aug 2019].  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  Brain MRI is negative for metastasis. Thoracentesis cytology and biopsy via bronchoscopy both confirm adenocarcinoma Recurrent stage IV lung adenocarcinoma. Recommend systemic chemotherapy plus immunotherapy. [facility] NGS/PD-L1 showed KEAP1 missense, TP53 missense, SMARCA4 frameshift, PTPRD copy loss, no EGFR, KRAS, BRAF, ALK, ROS1 RET MET ERBB mutations.  TMB 18.9 m/mb, MSS, PD-L1 <1% recommend systemic chemotherapy with carboplatin/Taxol/bevacizumab.   No actionable mutation on NGS. PDL-1<1%, TMB 18.9   Labs are reviewed and discussed with patient. Overall he tolerates chemotherapy.  Proceed with carboplatin/Taxol/bevacizumab Consider add immunotherapy in the future  Follow up with Dr. Rushie Chestnut  for palliative radiation for occluded right upper lobe, he  finishes RT on 7/24   Port-A-Cath in place Medi port placed, small area of skin opening..  Will avoid using medi port, use peripheral line for treatment, I shared the picture of the port site and discussed with Dr. Wyn Quaker.  He recommends close monitor.  He will come back in 1 week to see NP to check port status.   Anxiety associated with cancer diagnosis (HCC) low-dose lorazepam 0.5 mg every 12 hours as needed for anxiety  Pleural effusion Palliative therapeutic thoracentesis as needed.  Expect to improve with chemotherapy treatment.  Consider pleural catheter if refractory to treatment.  Repeat CXR today  Encounter for antineoplastic chemotherapy Treatment plan as listed above.   Weight loss Follow up with nutritionist   Orders Placed This Encounter  Procedures   DG Chest 2 View    Standing Status:   Future    Standing Expiration Date:   01/22/2024    Order Specific Question:   Reason for Exam (SYMPTOM  OR DIAGNOSIS REQUIRED)    Answer:   pleural effusion    Order Specific Question:   Preferred imaging location?    Answer:   [facility]   Protein, urine, random    Standing Status:   Future    Standing Expiration Date:   02/12/2024   CBC with Differential (Cancer Center Only)    Standing Status:   Future    Standing Expiration Date:   02/12/2024   CMP (Cancer Center only)    Standing Status:   Future    Standing Expiration Date:   02/12/2024   Follow up  1 week lab MD  All questions were answered. The patient knows to call the clinic with any problems, questions or concerns.  [name], MD, PhD [facility]  Hematology Oncology 01/22/2023      Oncology History:  Mark Mccarty is a  72 y.o.  male with presents for stage IIIA right lung adenocarcinoma, and presumed stage I left lung cancer now with stage IV recurrent lung adenocarcinoma. Former 43 pack year smoking history.   Oncology History  Primary lung adenocarcinoma, right (HCC)  12/13/2017 Initial  Diagnosis   cT3 N1 M0 right upper lobe lung adenocarcinoma  August 2019 Finished  concurrent chemoradiation.   03/17/2019 Finished 1 year of durvalumab treatment.   12/13/2017 Cancer Staging   Staging form: Lung, AJCC 8th Edition - Clinical stage from 12/13/2017: Stage IIIA (cT3, cN1, cM0) - Signed by [name], MD on 12/13/2017   12/31/2017 - 02/19/2018 Chemotherapy   Concurrent chemotherapy carboplatin and Taxol. With radiation   03/24/2018 - 03/17/2019 Chemotherapy   LUNG DURVALUMAB Q14D x 1 year     12/01/2020 Imaging   PET  1. The 1.1 cm in long axis left apical nodule has maximum SUV of 2.2. Given the progression in size over the last year, the appearance is suspicious for small/low-grade adenocarcinoma.  2. Upper normal sized AP window lymph nodes have maximum SUV just above blood pool, and are nonspecific.   Tumor board discussion 12/29/20  Slow-growing left upper lobe nodule.  CT-guided biopsy and biopsy via bronchoscopy are both of high risk due to underlying emphysema.  Consensus reached upon continue monitoring and repeat CT in 3 months.  If nodule continues to grow, he may benefit from empiric SBRT     03/13/2021 Imaging   CT without contrast showed slightly increase of left upper lobe nodule 12x51mm versus 11 x 8 mm.  Slightly increasing size.  Remains suspicious for bronchogenic neoplasm. Subtle nodularity along the posterior left upper lobe approximately 6 mm is unchanged.  Posttreatment changes distort the right hilum are unchanged.   04/20/2021 - 04/25/2021 Radiation Therapy   Patient received radiation to left upper lobe  nodule for presumed Stage I left upper lobe lung cancer.    07/18/2021 Imaging    CT chest without contrast showed stable posttreatment changes within the right lung.  No findings to suggest recurrent tumor or metastatic disease.  Interval decrease in size of previously characterized irregular nodule in the left upper lobe.    10/18/2021 Imaging    CT chest  showed a stable examination of chronic postradiation masslike fibrosis in the right lung. Persistent subsolid nodule in the superior segment of the left lower lobe 1.3 x 1.2 cm.  Nonspecific.  COPD findings, aortic atherosclerosis.  Coronary artery disease.   01/17/2022 Imaging   01/17/2022, CT chest without contrast showed subsolid area in the left lower lobe is similar to prior imaging however with increasing nodularity.  Recommend PET scan for further evaluation Perihilar parenchymal destruction and scarring from prior radiation in the right hilum extending into the right upper and lower lobe unchanged. Left upper lobe parenchymal scarring likely reflects treatment changes.  Marked pulmonary emphysema in the background.  Aortic atherosclerosis.   02/06/2022 Imaging   PET restaging 1. Focus of increasing nodularity in the subpleural left lower lobe identified as concerning on the recent CT chest shows only minimal FDG uptake on PET imaging today. While this is reassuring and the finding may reflect nfectious/inflammatory etiology, low-grade or well differentiated neoplasm can be poorly FDG avid. Close continued follow-up recommended. 2. Stable perifissural left lower lobe sub solid nodule, also with low level FDG uptake. Continued attention on follow-up recommende  05/17/2022 Imaging   1. Stable perihilar post radiation change and volume loss in the RIGHT hemithorax. 2. Stable mild mediastinal lymphadenopathy. 3. Peripheral nodule in the LEFT lower lobe measures slightly larger but has benign morphology. 4. Stable thickening RIGHT adrenal gland. 5. No new or progressive lung cancer identified   11/16/2022 Imaging   CT chest abdomen pelvis w contrast showed 1. New moderate right pleural effusion with diffuse, nodular,enhancing pleural thickening consistent with pleural metastatic disease. 2. Proximal obstruction of the right upper lobe segmental bronchi with complete atelectasis or  consolidation of the right upper lobe, consistent with local malignant recurrence and postobstructive atelectasis of the upper lobe. 3. Interval increase in circumferential soft tissue thickening about the right hilum. Slight interval increase in size in enlarged AP window lymph nodes. 4. Interval enlargement of a subpleural mass of the dependent superior segment left lower lobe consistent with an enlarging pulmonary metastasis or metachronous primary lung malignancy.5. Emphysema. 6. Coronary artery disease. 7. Prostatomegaly.    11/20/2022 Procedure   Thoracentesis right side Pathology is positive for adenocarcinoma.  Insufficient material for ancillary medical testing.    12/03/2022 Imaging   PET scan showed 1. Hypermetabolic mass at the RIGHT hilum consistent with bronchogenic carcinoma. 2. Postobstructive collapse of the RIGHT upper lobe. 3. Uniform hypermetabolic pleural thickening in the RIGHT lung consistent with pleural metastasis. 4. Large RIGHT effusion. 5. Hypermetabolic nodule in the superior segment of the LEFT lower lobe consistent with pulmonary metastasis. Synchronous bronchogenic carcinoma versus metastatic lesion. 6. Hypermetabolic bilateral mediastinal adenopathy consistent with nodal metastasis. 7. No evidence of metastatic disease outside the thorax. 8. No skeletal metastasis.      12/17/2022 Procedure   Patient underwent biopsy via bronchoscopy for additional tissue Pathology showed  Right upper lobe lavage-positive for non-small cell carcinoma Right upper lobe ENB assisted biopsy showed adenocarcinoma, acinar and solid patterns. Right upper lobe FNA positive for non-small cell carcinoma.  [facility] NGS/PD-L1 showed KEAP1 missense, TP53 missense, SMARCA4 frameshift, PTPRD copy loss, no EGFR, KRAS, BRAF, ALK, ROS1 RET MET ERBB mutations.  TMB 18.9 m/mb, MSS, PD-L1 <1%    12/24/2022 Cancer Staging   Staging form: Lung, AJCC 8th Edition - Pathologic stage from  12/24/2022: Stage IV (pTX, pNX, pM1) - Signed by [name], MD on 12/24/2022 Stage prefix: Initial diagnosis   01/01/2023 -  Chemotherapy   Patient is on Treatment Plan : LUNG NSCLC Carboplatin + Paclitaxel + Bevacizumab q21d       Multiple  liver nodules,  07/11/2020 MRI liver with and without contrast showed stable hypervascular lesions in the liver parenchyma without new or progressive findings.  Lesions remain most suggestive of benign etiology.  PET scan did not show evaluation activities.   INTERVAL HISTORY Mark Mccarty is a 72 y.o. male who has above history reviewed by me presents for follow-up of stage III A non-small cell lung cancer. Appetite is fair. Weight is stable.  + SOB, better for last thoracentesis.  No nausea vomiting diarrhea.  Medi port wound is not completely healed. Was seen by Dr. Wyn Quaker last week.   . Review of Systems  Constitutional:  Positive for fatigue and unexpected weight change. Negative for appetite change, chills and fever.  [name]:   Negative for hearing loss and voice change.   Eyes:  Negative for eye problems and icterus.  Respiratory:  Positive for shortness of breath. Negative for chest tightness and cough.   Cardiovascular:  Negative for chest pain and leg swelling.  Gastrointestinal:  Negative for abdominal distention and abdominal pain.  Endocrine: Negative for hot flashes.  Genitourinary:  Negative for difficulty urinating, dysuria and frequency.   Musculoskeletal:  Negative for arthralgias.  Skin:  Negative for itching and rash.  Neurological:  Negative for headaches, light-headedness and numbness.  [name]:  Negative for adenopathy. Does not bruise/bleed easily.  Psychiatric/Behavioral:  Negative for confusion.      MEDICAL HISTORY:  Past Medical History:  Diagnosis Date   Abnormal nuclear stress test    Anxiety    a.) on BZO (lorazepam) PRN   Asthma    Avascular necrosis of femoral head (HCC)    BPH (benign prostatic  hyperplasia)    Cataract    a.) s/p extraction on LEFT   Colon adenomas    COPD (chronic obstructive pulmonary disease) (HCC)    Coronary artery disease 11/11/2014   a.) MPI 11/11/2014: EF 56%, mild inf wall ischemia; b.) LHC 12/16/2014: 20% mRCA - med mgmt   DDD (degenerative disc disease), lumbar    Diverticulosis    DM type 2 (diabetes mellitus, type 2) (HCC)    Duodenitis    Dysphagia    Dyspnea    ED (erectile dysfunction)    a.) on PDE5i (sildenafil) PRN   Elevated PSA    Former smoker    Gastritis    GERD (gastroesophageal reflux disease)    [name]    Hilar mass    History of hiatal hernia    Hyperlipidemia    Hypertension    Hyperuricemia    Hypokalemia    Left ventricular diastolic dysfunction 10/04/2016   a.) TTE 10/04/2016: EF 50%, mild MR/TR/PR, mod AR, G1DD; b.) TTE 10/10/2017: EF 45%, triv PR, mild AR/MR/TR, G1DD; c.) TTE 03/30/2020: EF 50%, mild-mod AR, mild MR/TR/PR; d.) TTE 04/23/2022: EF >55%, mild LVH, mild LAE, triv TR, mild PR, mod AR/MR   Liver lesion    Obesity    Pedal edema    Peripheral polyneuropathy    Peyronie's disease 05/2022   Pleural effusion    Primary lung adenocarcinoma, right (HCC)    a.) stage IIIA (cT3, cN1, cM0) --> s/p concurrent chemoradiation 2019 --> carboplatin + pacilitaxel (12/31/2017 - 02/19/2018) followed by 1 year (03/24/2018 - 03/17/2019) adjuvant anti-PDL-1 mAB immunotherapy (durvalumab)   [name] (peptic ulcer disease)    Renal insufficiency    Umbilical hernia    [name]     SURGICAL HISTORY: Past Surgical History:  Procedure Laterality Date   CARDIAC CATHETERIZATION N/A 12/16/2014   Procedure: Left Heart Cath;  Surgeon: Marcina Millard, MD;  Location: [facility] INVASIVE CV LAB;  Service: Cardiovascular;  Laterality: N/A;   CARDIOVERSION N/A 01/17/2023   Procedure: CARDIOVERSION;  Surgeon: Clotilde Dieter, DO;  Location: [name];  Service: Cardiovascular;  Laterality: N/A;   CATARACT  EXTRACTION Left    COLONOSCOPY     COLONOSCOPY WITH PROPOFOL N/A 01/14/2020   Procedure: COLONOSCOPY WITH PROPOFOL;  Surgeon: Toledo, Boykin Nearing, MD;  Location: [facility] ENDOSCOPY;  Service: Gastroenterology;  Laterality: N/A;   CYST REMOVAL TRUNK     chest and back over time and it was removed   CYSTECTOMY     ENDOBRONCHIAL ULTRASOUND N/A 12/09/2017   Procedure: ENDOBRONCHIAL ULTRASOUND;  Surgeon: Shane Crutch, MD;  Location: [name];  Service: Pulmonary;  Laterality: N/A;   PORTA CATH INSERTION N/A 12/23/2017   Procedure: PORTA CATH INSERTION;  Surgeon: Annice Needy, MD;  Location: [facility] INVAS[facility] CV LAB;  Service: Cardiovascular;  Laterality: N/A;  PORTA CATH INSERTION N/A 01/08/2023   Procedure: PORTA CATH INSERTION;  Surgeon: Annice Needy, MD;  Location: [facility];  Service: Cardiovascular;  Laterality: N/A;   PORTA CATH REMOVAL N/A 06/04/2022   Procedure: PORTA CATH REMOVAL;  Surgeon: Annice Needy, MD;  Location: [facility]VAS[facility] CV LAB;  Service: Cardiovascular;  Laterality: N/A;   TEE WITHOUT CARDIOVERSION N/A 01/17/2023   Procedure: TRANSESOPHAGEAL ECHOCARDIOGRAM (TEE);  Surgeon: Clotilde Dieter, DO;  Location: [name];  Service: Cardiovascular;  Laterality: N/A;   UPPER GI ENDOSCOPY     VIDEO BRONCHOSCOPY WITH ENDOBRONCHIAL ULTRASOUND N/A 12/17/2022   Procedure: VIDEO BRONCHOSCOPY WITH ENDOBRONCHIAL ULTRASOUND;  Surgeon: Vida Rigger, MD;  Location: [name];  Service: Thoracic;  Laterality: N/A;    SOCIAL HISTORY: Social History   Socioeconomic History   Marital status: Divorced    Spouse name: Not on file   Number of children: Not on file   Years of education: Not on file   Highest education level: Not on file  Occupational History   Not on file  Tobacco Use   Smoking status: Former    Current packs/day: 0.00    Average packs/day: 1.5 packs/day for 35.0 years (52.5 ttl pk-yrs)    Types: Cigarettes    Start date: 06/21/1980    Quit date: 06/22/2015     Years since quitting: 7.5    Passive exposure: Past   Smokeless tobacco: Never  Vaping Use   Vaping status: Never Used  Substance and Sexual Activity   Alcohol use: Not Currently    Comment: weekends   Drug use: No   Sexual activity: Not Currently  Other Topics Concern   Not on file  Social History Narrative   Lives with son [name].   Social Determinants of Health   Financial Resource Strain: Not on file  Food Insecurity: Not on file  Transportation Needs: Not on file  Physical Activity: Not on file  Stress: Not on file  Social Connections: Unknown (11/14/2021)   [facility]   Social Network    Social Network: Not on file  Intimate Partner Violence: Unknown (10/05/2021)   Received from [facility]   HITS    Physically Hurt: Not on file    Insult or Talk Down To: Not on file    Threaten Physical Harm: Not on file    Scream or Curse: Not on file    FAMILY HISTORY: Family History  Problem Relation Age of Onset   Hypertension Mother    Breast cancer Mother    Heart attack Mother    Lung cancer Father    Heart disease Sister    Diabetes Sister    Colon cancer Sister     ALLERGIES:  is allergic to penicillins.  MEDICATIONS:  Current Outpatient Medications  Medication Sig Dispense Refill   albuterol (VENTOLIN HFA) 108 (90 Base) MCG/ACT inhaler Inhale 2 puffs into the lungs every 4 (four) hours as needed.     amLODipine (NORVASC) 10 MG tablet Take 10 mg by mouth every morning.     ascorbic acid (VITAMIN C) 1000 MG tablet Take 1,000 mg by mouth daily.     aspirin 81 MG tablet Take 81 mg by mouth daily.     carvedilol (COREG) 25 MG tablet Take 25 mg by mouth 2 (two) times daily with a meal.     cholecalciferol (VITAMIN D3) 25 MCG (1000 UNIT) tablet Take 1,000 Units by mouth daily.     Fluticasone-Umeclidin-Vilant (TRELEGY  ELLIPTA) 200-62.5-25 MCG/ACT AEPB Inhale 1 puff into the lungs every morning.     gabapentin (NEURONTIN) 300 MG capsule Take 300 mg  by mouth at bedtime as needed.      ipratropium-albuterol (DUONEB) 0.5-2.5 (3) MG/3ML SOLN Take 3 mLs by nebulization every 6 (six) hours as needed.     lidocaine-prilocaine (EMLA) cream Apply to affected area once 30 g 3   lisinopril (PRINIVIL,ZESTRIL) 40 MG tablet Take 40 mg by mouth every morning.     LORazepam (ATIVAN) 0.5 MG tablet Take 1 tablet (0.5 mg total) by mouth every 12 (twelve) hours as needed for anxiety. 30 tablet 0   potassium chloride (KLOR-CON) 10 MEQ tablet Take 1 tablet (10 mEq total) by mouth daily. (Patient taking differently: Take 10 mEq by mouth every morning.) 90 tablet 1   pravastatin (PRAVACHOL) 20 MG tablet Take 20 mg by mouth at bedtime.     senna-docusate (SENOKOT-S) 8.6-50 MG tablet Take 2 tablets by mouth daily. 60 tablet 2   spironolactone (ALDACTONE) 25 MG tablet Take by mouth.     tamsulosin (FLOMAX) 0.4 MG CAPS capsule Take 0.4 mg by mouth daily after breakfast.     torsemide (DEMADEX) 20 MG tablet Take 20 mg by mouth every morning.     Vibegron (GEMTESA) 75 MG TABS Take 1 tablet (75 mg total) by mouth daily. (Patient taking differently: Take 1 tablet by mouth every morning.) 30 tablet 0   acetaminophen (TYLENOL) 500 MG tablet Take 500 mg by mouth as needed. (Patient not taking: Reported on 01/17/2023)     cetirizine (ZYRTEC) 10 MG tablet Take 1 tablet by mouth as needed. (Patient not taking: Reported on 01/17/2023)     dexamethasone (DECADRON) 4 MG tablet Take 2 tablets (8mg ) by mouth daily starting the day after carboplatin for 3 days. Take with food (Patient not taking: Reported on 01/17/2023) 30 tablet 1   ibuprofen (ADVIL,MOTRIN) 600 MG tablet Take 600 mg by mouth every 6 (six) hours as needed. (Patient not taking: Reported on 01/17/2023)     ondansetron (ZOFRAN) 8 MG tablet Take 1 tablet (8 mg total) by mouth every 8 (eight) hours as needed for nausea or vomiting. Start on the third day after carboplatin. (Patient not taking: Reported on 01/22/2023) 30 tablet 1    prochlorperazine (COMPAZINE) 10 MG tablet Take 1 tablet (10 mg total) by mouth every 6 (six) hours as needed for nausea or vomiting. (Patient not taking: Reported on 01/22/2023) 30 tablet 1   sildenafil (VIAGRA) 50 MG tablet Take 1-2 tablets (50-100 mg total) by mouth daily as needed for erectile dysfunction. (Patient not taking: Reported on 01/17/2023) 30 tablet 6   No current facility-administered medications for this visit.   Facility-Administered Medications Ordered in Other Visits  Medication Dose Route Frequency Provider Last Rate Last Admin   bevacizumab-awwb (MVASI) 1,400 mg in sodium chloride 0.9 % 100 mL chemo infusion  15 mg/kg (Treatment Plan Recorded) Intravenous Once [name], MD 312 mL/hr at 01/22/23 1233 1,400 mg at 01/22/23 1233   CARBOplatin (PARAPLATIN) 590 mg in sodium chloride 0.9 % 250 mL chemo infusion  590 mg Intravenous Once [name], MD       heparin lock flush 100 unit/mL  500 Units Intravenous Once [name], MD       PACLitaxel (TAXOL) 330 mg in sodium chloride 0.9 % 500 mL chemo infusion (> 80mg /m2)  150 mg/m2 (Treatment Plan Recorded) Intravenous Once [name], MD  sodium chloride flush (NS) 0.9 % injection 10 mL  10 mL Intravenous PRN [name], MD   10 mL at 03/18/18 0858   sodium chloride flush (NS) 0.9 % injection 10 mL  10 mL Intravenous Once [name], MD         PHYSICAL EXAMINATION: ECOG PERFORMANCE STATUS: 0 - Asymptomatic Vitals:   01/22/23 1058  BP: (!) 144/84  Pulse: 75  Resp: 18  Temp: (!) 96.6 F (35.9 C)  SpO2: 96%   Filed Weights   01/22/23 1058  Weight: 205 lb 9.6 oz (93.3 kg)    Physical Exam Constitutional:      General: He is not in acute distress. [name]:     Head: Normocephalic and atraumatic.  Eyes:     General: No scleral icterus.    Pupils: Pupils are equal, round, and reactive to light.  Cardiovascular:     Rate and Rhythm: Normal rate and regular rhythm.     Heart sounds: Normal heart sounds.  Pulmonary:      Effort: Pulmonary effort is normal. No respiratory distress.     Comments: Decreased breath sound bilaterally Abdominal:     General: Bowel sounds are normal. There is no distension.     Palpations: Abdomen is soft.  Musculoskeletal:        General: No deformity. Normal range of motion.     Cervical back: Normal range of motion and neck supple.  Lymphadenopathy:     Cervical: No cervical adenopathy.  Skin:    General: Skin is warm and dry.     Findings: No rash.     Comments: Left supraclavicular fossa fullness, chronic. Right chest wall medi port with surrounding bruising, small area of skin opening.  no discharge or tenderness.   Neurological:     Mental Status: He is alert and oriented to person, place, and time. Mental status is at baseline.     Cranial Nerves: No cranial nerve deficit.      LABORATORY DATA:  I have reviewed the data as listed    Latest Ref Rng & Units 01/22/2023   10:21 AM 01/16/2023   10:16 AM 01/08/2023   10:36 AM  CBC  WBC 4.0 - 10.5 K/uL 10.8  4.4  4.9   Hemoglobin 13.0 - 17.0 g/dL 13.0  86.5  78.4   Hematocrit 39.0 - 52.0 % 46.0  43.4  44.7   Platelets 150 - 400 K/uL 144  282  267       Latest Ref Rng & Units 01/22/2023   10:21 AM 01/16/2023   10:16 AM 01/08/2023   10:36 AM  CMP  Glucose 70 - 99 mg/dL 696  295  284   BUN 8 - 23 mg/dL 13  19  14    Creatinine 0.61 - 1.24 mg/dL 1.32  4.40  1.02   Sodium 135 - 145 mmol/L 138  135  136   Potassium 3.5 - 5.1 mmol/L 4.3  4.1  4.0   Chloride 98 - 111 mmol/L 100  96  99   CO2 22 - 32 mmol/L 30  31  29    Calcium 8.9 - 10.3 mg/dL 8.6  8.5  8.9   Total Protein 6.5 - 8.1 g/dL 6.2  6.3  6.8   Total Bilirubin 0.3 - 1.2 mg/dL 0.4  0.2  0.4   Alkaline Phos 38 - 126 U/L 58  58  59   AST 15 - 41 U/L 17  21  19  ALT 0 - 44 U/L 24  31  18       RADIOGRAPHIC STUDIES: I have personally reviewed the radiological images as listed and agreed with the findings in the report.[name] ECHO TEE  Result Date: 01/17/2023     TRANSESOPHOGEAL ECHO REPORT   Patient Name:   Mark Mccarty Date of Exam: 01/17/2023 Medical Rec #:  [id number]         Height:       72.0 in Accession #:    [id number]        Weight:       207.9 lb Date of Birth:  1950-11-29         BSA:          2.166 m Patient Age:    72 years          BP:           122/82 mmHg Patient Gender: M                 HR:           77 bpm. Exam Location:  [facility] Procedure: Transesophageal Echo, Cardiac Doppler and Color Doppler Indications:     Not listed on TEE check-in sheet  History:         Patient has no prior history of Echocardiogram examinations.  Sonographer:     Cristela Blue Referring Phys:  [id number] [facility] CUSTOVIC Diagnosing Phys: Rozell Searing Custovic PROCEDURE: The transesophogeal probe was passed without difficulty through the esophogus of the patient. Sedation performed by different physician. The patient developed no complications during the procedure.  IMPRESSIONS  1. Left ventricular ejection fraction, by estimation, is 55 to 60%. The left ventricle has normal function.  2. Right ventricular systolic function was not well visualized. The right ventricular size is not well visualized.  3. No left atrial/left atrial appendage thrombus was detected.  4. The mitral valve was not assessed. No evidence of mitral valve regurgitation.  5. The aortic valve was not assessed. Aortic valve regurgitation is not visualized. Conclusion(s)/Recommendation(s): Normal biventricular function without evidence of hemodynamically significant valvular heart disease. No LA/LAA thrombus identified. Successful cardioversion performed with restoration of normal sinus rhythm. FINDINGS  Left Ventricle: Left ventricular ejection fraction, by estimation, is 55 to 60%. The left ventricle has normal function. The left ventricular internal cavity size was normal in size. Right Ventricle: The right ventricular size is not well visualized. Right vetricular wall thickness was not assessed. Right ventricular  systolic function was not well visualized. Left Atrium: Left atrial size was normal in size. No left atrial/left atrial appendage thrombus was detected. Right Atrium: Right atrial size was not assessed. Pericardium: The pericardium was not assessed. Mitral Valve: The mitral valve was not assessed. No evidence of mitral valve regurgitation. Tricuspid Valve: The tricuspid valve is not assessed. Tricuspid valve regurgitation is not demonstrated. Aortic Valve: The aortic valve was not assessed. Aortic valve regurgitation is not visualized. Pulmonic Valve: The pulmonic valve was not assessed. Pulmonic valve regurgitation is not visualized. Aorta: Aortic root could not be assessed. IAS/Shunts: The interatrial septum was not assessed. Clotilde Dieter Electronically signed by Clotilde Dieter Signature Date/Time: 01/17/2023/1:54:22 PM    Final    DG Chest Port 1 View  Result Date: 01/10/2023 CLINICAL DATA:  Right-sided thoracentesis. EXAM: PORTABLE CHEST 1 VIEW COMPARISON:  One-view chest x-ray 01/09/2023 FINDINGS: The heart is enlarged.  A right IJ Port-A-Cath is stable. The right pleural effusion has decreased. No pneumothorax is present. No  significant left-sided effusion is present. A left perihilar density is again noted. IMPRESSION: 1. Interval decrease in right pleural effusion without pneumothorax. 2. Stable left perihilar density. 3. Cardiomegaly without failure. Electronically Signed   By: Marin Roberts M.D.   On: 01/10/2023 16:25   US THORACENTESIS ASP PLEURAL SPACE W/IMG GUIDE  Result Date: 01/10/2023 INDICATION: Patient with history of right lung cancer and recurrent symptomatic pleural effusion request received for therapeutic thoracentesis. EXAM: ULTRASOUND GUIDED RIGHT THORACENTESIS MEDICATIONS: Local 1% lidocaine only. COMPLICATIONS: None immediate. PROCEDURE: An ultrasound guided thoracentesis was thoroughly discussed with the patient and questions answered. The benefits, risks, alternatives  and complications were also discussed. The patient understands and wishes to proceed with the procedure. Written consent was obtained. Ultrasound was performed to localize and mark an adequate pocket of fluid in the right chest. The area was then prepped and draped in the normal sterile fashion. 1% Lidocaine was used for local anesthesia. Under ultrasound guidance a 19 gauge, 7-cm, Yueh catheter was introduced. Thoracentesis was performed. The catheter was removed and a dressing applied. FINDINGS: A total of approximately 2 L of serosanguineous fluid was removed. IMPRESSION: Successful ultrasound guided right thoracentesis yielding 2 L of pleural fluid. This exam was performed by Pattricia Boss PA-C, and was supervised and interpreted by Dr. Juliette Alcide. Electronically Signed   By: Olive Bass M.D.   On: 01/10/2023 16:13   DG Chest 2 View  Result Date: 01/09/2023 CLINICAL DATA:  Shortness of breath. Primary lung adenocarcinoma, rate EXAM: CHEST - 2 VIEW COMPARISON:  Chest radiographs 12/28/2022 and 12/25/2022; CT 12/14/2022 FINDINGS: New right chest wall porta catheter with tip overlying the superior vena cava. The mid aspect of this catheter curls approximately 2.5 cm superiorly at the right neck base before turning inferiorly and extending into the superior vena cava. Interval increase in homogeneous opacity indicating pleural effusion involving the inferior 60% of the right hemithorax compared to 12/28/2022, now more similar to 12/25/2022. There is again homogeneous opacification from right upper lobe collapse. There is again an approximate 2-3 cm faint density overlying the left midlung corresponding to the nodule better seen on prior CT within the superior segment of the left lower lobe on prior CT. No left pleural effusion. No pneumothorax. Visualized left side of the cardiac silhouette and mediastinal contours are unremarkable. Mild multilevel degenerative disc changes of the thoracic spine. IMPRESSION: 1.  New right chest wall porta catheter with tip overlying the superior vena cava. The mid aspect of this catheter curls approximately 2.5 cm superiorly at the right neck base, likely into the right internal jugular vein before turning inferiorly and extending into the superior vena cava. 2. Interval increase in homogeneous opacity indicating moderate-to-large pleural effusion involving the inferior 60% of the right hemithorax compared to 12/28/2022, now more similar to 12/25/2022. There also may be right lower lobe collapse, as seen on 12/14/2022 CT. 3. There is again homogeneous opacification of the superomedial right hemithorax from right upper lobe collapse. 4. There is again an approximate 2-3 cm faint density overlying the left midlung corresponding to the nodule better seen on prior CT within the superior segment of the left lower lobe on prior CT. Electronically Signed   By: Neita Garnet M.D.   On: 01/09/2023 12:38   PERIPHERAL VASCULAR CATHETERIZATION  Result Date: 01/08/2023 See surgical note for result.  US THORACENTESIS ASP PLEURAL SPACE W/IMG GUIDE  Result Date: 12/28/2022 INDICATION: Right pleural effusion EXAM: ULTRASOUND GUIDED RIGHT THORACENTESIS MEDICATIONS: None. COMPLICATIONS: None immediate.  PROCEDURE: An ultrasound guided thoracentesis was thoroughly discussed with the patient and questions answered. The benefits, risks, alternatives and complications were also discussed. The patient understands and wishes to proceed with the procedure. Written consent was obtained. Ultrasound was performed to localize and mark an adequate pocket of fluid in the right chest. The area was then prepped and draped in the normal sterile fashion. 1% Lidocaine was used for local anesthesia. Under ultrasound guidance a 19 gauge, 7-cm, Yueh catheter was introduced. Thoracentesis was performed. The catheter was removed and a dressing applied. FINDINGS: A total of approximately 2.0 L of thin serosanguineous fluid was  removed. IMPRESSION: Successful ultrasound guided right thoracentesis yielding 2.0 L of serosanguineous pleural fluid. Electronically Signed   By: Olive Bass M.D.   On: 12/28/2022 15:30   DG Chest 2 View  Result Date: 12/28/2022 CLINICAL DATA:  Right pleural effusion EXAM: CHEST - 2 VIEW COMPARISON:  Previous studies including the examination of 12/18/2022 FINDINGS: There is a interval increase in size of large right pleural effusion. There is opacification of right mid and right lower lung fields. Homogeneous density in the medial right upper lung field corresponds to her case is seen right upper lobe. There is faint 2.1 cm nodular density in left parahilar region corresponding to the lung nodule in left lower lobe seen in previous CT. Scarring is seen in the left apex. IMPRESSION: There is large right pleural effusion with interval increase. Homogeneous opacity in the medial right upper lung field corresponds to atelectasis seen in previous CT. There is 2.1 cm nodular density in the left mid lung field corresponding to a nodular density in left lower lobe seen in previous CT. Electronically Signed   By: Ernie Avena M.D.   On: 12/28/2022 14:49   DG Chest Port 1 View  Result Date: 12/28/2022 CLINICAL DATA:  Right pleural effusion, status post thoracentesis EXAM: PORTABLE CHEST 1 VIEW COMPARISON:  Previous studies including the examination of 12/25/2022 FINDINGS: Transverse diameter of heart is increased. There is homogeneous opacity in the medial right upper lung field consistent with previously described atelectasis in right upper lobe. There is decreasing large right pleural effusion. There is no pneumothorax. Left lung is clear. Evaluation of right lower lung field is limited by the effusion. IMPRESSION: There is decrease in amount of right pleural effusion after thoracentesis. There is no pneumothorax. Electronically Signed   By: Ernie Avena M.D.   On: 12/28/2022 14:44   DG Chest  Port 1 View  Result Date: 12/18/2022 CLINICAL DATA:  Pleural effusion. EXAM: PORTABLE CHEST 1 VIEW COMPARISON:  12/17/2022 FINDINGS: Stable asymmetric elevation right hemidiaphragm. Interval decrease in right pleural effusion. Bibasilar atelectasis again noted. Cardiopericardial silhouette is at upper limits of normal for size. No acute bony abnormality. Soft tissue fullness in the medial right apex is similar to prior, compatible with right upper lobe collapse. IMPRESSION: 1. Interval decrease in right pleural effusion. 2. Persistent right upper lobe collapse. Electronically Signed   By: Kennith Center M.D.   On: 12/18/2022 12:20   US THORACENTESIS ASP PLEURAL SPACE W/IMG GUIDE  Result Date: 12/17/2022 INDICATION: Patient with right lung cancer with recurrent pleural effusion. Interventional radiology asked to perform a therapeutic thoracentesis. EXAM: ULTRASOUND GUIDED THORACENTESIS MEDICATIONS: 1% lidocaine 10 mL COMPLICATIONS: None immediate. PROCEDURE: An ultrasound guided thoracentesis was thoroughly discussed with the patient and questions answered. The benefits, risks, alternatives and complications were also discussed. The patient understands and wishes to proceed with the procedure. Written consent was  obtained. Ultrasound was performed to localize and mark an adequate pocket of fluid in the right chest. The area was then prepped and draped in the normal sterile fashion. 1% Lidocaine was used for local anesthesia. Under ultrasound guidance a 6 Fr Safe-T-Centesis catheter was introduced. Thoracentesis was performed. The catheter was removed and a dressing applied. FINDINGS: A total of approximately 1.5 L of amber colored fluid was removed. IMPRESSION: Successful ultrasound guided right thoracentesis yielding 1.5 L of pleural fluid. Procedure performed by Alwyn Ren NP and supervised by Dr. Archer Asa. Electronically Signed   By: Malachy Moan M.D.   On: 12/17/2022 20:15   CT SUPER D CHEST WO  MONARCH PILOT  Result Date: 12/17/2022 CLINICAL DATA:  Non-small cell lung cancer, ongoing immunotherapy, pleural metastatic disease * Tracking Code: BO * EXAM: CT CHEST WITHOUT CONTRAST TECHNIQUE: Multidetector CT imaging of the chest was performed using thin slice collimation for electromagnetic bronchoscopy planning purposes, without intravenous contrast. RADIATION DOSE REDUCTION: This exam was performed according to the departmental dose-optimization program which includes automated exposure control, adjustment of the mA and/or kV according to patient size and/or use of iterative reconstruction technique. COMPARISON:  Multiple exams, including PET-CT 12/03/2022 FINDINGS: Cardiovascular: Coronary, aortic arch, and branch vessel atherosclerotic vascular disease. Trace pericardial effusion. Mediastinum/Nodes: Mediastinal adenopathy was recently hypermetabolic with representative nodes including a 1.1 cm AP window lymph node on image 58 of series 2 and a 1.6 cm subcarinal lymph node on image 72 series 2. Lungs/Pleura: Very large right pleural effusion with only a small amount of aerated right middle lobe and complete atelectasis of the right lower lobe and right upper lobe. Extensive rind like density along the pleural is irregular and compatible with pleural metastatic disease. There is also nodular infiltration of the pleural adipose tissue at the right anterior lung base adjacent to the pericardium compatible with tumor infiltration. The patient has a known right hilar and infrahilar mass which is obscured by the surrounding atelectasis. Severe emphysema. A pleural-based nodule in the left lower lobe superior segment measures 2.2 by 2.4 cm on image 79 of series 4 and was hypermetabolic on the 12/03/2022 PET-CT compatible with a contralateral metastatic lesion. Upper Abdomen: No adrenal mass. Scattered colonic diverticula. Abdominal aortic atherosclerosis. Musculoskeletal: Probable hemangioma at the L2 vertebral  level. Not previously hypermetabolic. IMPRESSION: 1. Very large right pleural effusion with only a small amount of aerated right middle lobe and complete atelectasis of the right lower lobe and right upper lobe. 2. Extensive rind like density along the pleural is irregular and compatible with pleural metastatic disease. There is also nodular infiltration of the pleural adipose tissue at the right anterior lung base adjacent to the pericardium compatible with tumor infiltration. 3. The patient has a known right hilar and infrahilar mass which is obscured by the surrounding atelectasis. 4. Mediastinal adenopathy was recently hypermetabolic on the 12/03/2022 PET-CT compatible with metastatic disease. 5. A pleural-based nodule in the left lower lobe superior segment measures 2.2 by 2.4 cm and was hypermetabolic on the 12/03/2022 PET-CT compatible with a contralateral metastatic lesion. 6. Severe emphysema. 7. Coronary, aortic arch, and branch vessel atherosclerotic vascular disease. 8. Trace pericardial effusion. 9. Probable hemangioma at the L2 vertebral level. Aortic Atherosclerosis (ICD10-I70.0) and Emphysema (ICD10-J43.9). Electronically Signed   By: Gaylyn Rong M.D.   On: 12/17/2022 18:06   DG Chest Port 1 View  Result Date: 12/17/2022 CLINICAL DATA:  Status post bronchoscopy EXAM: PORTABLE CHEST 1 VIEW COMPARISON:  11/20/2022 FINDINGS: There is  only a miniscule amount of aerated lung in the right mid upper chest, otherwise the right hemithorax is completely opacified. Indistinct pulmonary vasculature on the left possibly from pulmonary venous hypertension. No blunting of the left costophrenic angle. No visible pneumothorax. IMPRESSION: 1. Near complete opacification of the right hemithorax, with only a miniscule amount of aerated lung in the right mid upper chest. 2. Indistinct pulmonary vasculature on the left possibly from pulmonary venous hypertension. Electronically Signed   By: Gaylyn Rong  M.D.   On: 12/17/2022 17:56   DG Chest Port 1 View  Result Date: 12/17/2022 CLINICAL DATA:  Status post thoracentesis EXAM: PORTABLE CHEST 1 VIEW COMPARISON:  Chest x-ray dated June 17th 2020 FINDINGS: Visualized cardiac and mediastinal contours are unchanged. Moderate right pleural effusion, decreased in size when compared with the prior exam. Lung predominant heterogeneous opacities, likely atelectasis. Persistent right upper lobe collapse. No evidence of pneumothorax. IMPRESSION: 1. Moderate right pleural effusion, decreased in size. 2. No evidence of pneumothorax. 3. Persistent right upper lobe collapse. Electronically Signed   By: Allegra Lai M.D.   On: 12/17/2022 16:42   MR Brain W Wo Contrast  Result Date: 12/16/2022 CLINICAL DATA:  Lung cancer, evaluate for metastatic disease EXAM: MRI HEAD WITHOUT AND WITH CONTRAST TECHNIQUE: Multiplanar, multiecho pulse sequences of the brain and surrounding structures were obtained without and with intravenous contrast. CONTRAST:  10mL GADAVIST GADOBUTROL 1 MMOL/ML IV SOLN COMPARISON:  08/25/2019 FINDINGS: Brain: No restricted diffusion to suggest acute or subacute infarct. No abnormal parenchymal or meningeal enhancement. No acute hemorrhage, mass, mass effect, or midline shift. No hydrocephalus or extra-axial collection. Partial empty sella. Craniocervical junction. No hemosiderin deposition to suggest remote hemorrhage. Normal cerebral volume for age. Scattered T2 hyperintense signal in the periventricular white matter, likely the sequela of mild chronic small vessel ischemic disease. Vascular: Normal arterial flow voids. Skull and upper cervical spine: Normal marrow signal. Sinuses/Orbits: Clear paranasal sinuses. No acute finding in the orbits. Status post left lens replacement. Other: The mastoid air cells are well aerated. IMPRESSION: No acute intracranial process. No evidence of metastatic disease in the brain. Electronically Signed   By: Wiliam Ke M.D.   On: 12/16/2022 03:30   NM PET Image Restag (PS) Skull Base To Thigh  Result Date: 12/03/2022 CLINICAL DATA:  Subsequent treatment strategy for non-small cell lung cancer. EXAM: NUCLEAR MEDICINE PET SKULL BASE TO THIGH TECHNIQUE: 12.1 mCi F-18 FDG was injected intravenously. Full-ring PET imaging was performed from the skull base to thigh after the radiotracer. CT data was obtained and used for attenuation correction and anatomic localization. Fasting blood glucose: 101 mg/dl COMPARISON:  PET-CT 29/56/2130 CT 11/16/2022 FINDINGS: Mediastinal blood pool activity: SUV max 2.1 Liver activity: SUV max NA NECK: No hypermetabolic lymph nodes in the neck. Incidental CT findings: None. CHEST: Rim intense hypermetabolic activity within the RIGHT hemithorax pleural space. This thin rim of metabolic 2 borders in large RIGHT pleural effusion. More nodular pleural thickening in the medial aspect of the RIGHT middle lobe with SUV max equal 8.5. Nodular thickening measuring up to 9 mm (image 70/4). Hypermetabolic thickening at the RIGHT hilum measures 3.5 cm surrounds the RIGHT upper lobe bronchi. Activity is intense with SUV max equal 8.5. There is postobstructive collapse of the RIGHT upper lobe. Large RIGHT effusion. Within the superior segment LEFT lower lobe 22 mm hypermetabolic nodule with SUV max equal 9.5. There is small bilateral hypermetabolic mediastinal adenopathy and subcarinal adenopathy. Incidental CT findings: None. ABDOMEN/PELVIS: Limited  view of the liver, kidneys, pancreas are unremarkable. Normal adrenal glands. Incidental CT findings: Adrenal glands normal. No metastatic adenopathy. No liver metastasis. SKELETON: No focal hypermetabolic activity to suggest skeletal metastasis. Several sclerotic lesions in the pelvis and spine without metabolic activity. Incidental CT findings: None. IMPRESSION: 1. Hypermetabolic mass at the RIGHT hilum consistent with bronchogenic carcinoma. 2. Postobstructive  collapse of the RIGHT upper lobe. 3. Uniform hypermetabolic pleural thickening in the RIGHT lung consistent with pleural metastasis. 4. Large RIGHT effusion. 5. Hypermetabolic nodule in the superior segment of the LEFT lower lobe consistent with pulmonary metastasis. Synchronous bronchogenic carcinoma versus metastatic lesion. 6. Hypermetabolic bilateral mediastinal adenopathy consistent with nodal metastasis. 7. No evidence of metastatic disease outside the thorax. 8. No skeletal metastasis. Electronically Signed   By: Genevive Bi M.D.   On: 12/03/2022 16:33   US THORACENTESIS ASP PLEURAL SPACE W/IMG GUIDE  Result Date: 11/20/2022 INDICATION: Right pleural effusion EXAM: ULTRASOUND GUIDED RIGHT THORACENTESIS MEDICATIONS: 7 cc 1% lidocaine COMPLICATIONS: None immediate. PROCEDURE: An ultrasound guided thoracentesis was thoroughly discussed with the patient and questions answered. The benefits, risks, alternatives and complications were also discussed. The patient understands and wishes to proceed with the procedure. Written consent was obtained. Ultrasound was performed to localize and mark an adequate pocket of fluid in the right chest. The area was then prepped and draped in the normal sterile fashion. 1% Lidocaine was used for local anesthesia. Under ultrasound guidance a catheter was introduced. Thoracentesis was performed. The catheter was removed and a dressing applied. FINDINGS: A total of approximately 1 L of hazy yellow fluid was removed. Samples were sent to the laboratory as requested by the clinical team. IMPRESSION: Successful ultrasound guided right thoracentesis yielding 1 L of pleural fluid. Follow-up chest x-ray revealed no evidence of pneumothorax. Procedure performed by Mina Marble, PA-C Electronically Signed   By: Olive Bass M.D.   On: 11/20/2022 15:47   DG Chest Port 1 View  Result Date: 11/20/2022 CLINICAL DATA:  s/p thoracentesis, right EXAM: PORTABLE CHEST 1 VIEW  COMPARISON:  Multiple prior CTs FINDINGS: There is right upper lobe collapse. The heart appears within normal limits. No significant pleural effusion appreciated status post right thoracentesis. No pneumothorax. The left lung is clear. No acute osseous abnormality. IMPRESSION: 1. No pneumothorax status post right thoracentesis. No significant residual pleural effusion. 2. Known right upper lobe collapse. Electronically Signed   By: Olive Bass M.D.   On: 11/20/2022 14:57   CT CHEST ABDOMEN PELVIS W CONTRAST  Result Date: 11/16/2022 CLINICAL DATA:  Lung cancer restaging, ongoing immunotherapy * Tracking Code: BO * EXAM: CT CHEST, ABDOMEN, AND PELVIS WITH CONTRAST TECHNIQUE: Multidetector CT imaging of the chest, abdomen and pelvis was performed following the standard protocol during bolus administration of intravenous contrast. RADIATION DOSE REDUCTION: This exam was performed according to the departmental dose-optimization program which includes automated exposure control, adjustment of the mA and/or kV according to patient size and/or use of iterative reconstruction technique. CONTRAST:  OMNIPAQUE IOHEXOL 300 MG/ML  SOLN COMPARISON:  CT chest, 05/16/2022, PET-CT, 02/05/2022 FINDINGS: CT CHEST FINDINGS Cardiovascular: Aortic atherosclerosis. Normal heart size. Scattered left coronary artery calcifications. No pericardial effusion. Mediastinum/Nodes: Interval increase in circumferential soft tissue thickening about the right hilum (series 2, image 24). Slight interval increase in size in enlarged AP window lymph nodes, measuring up to 2.3 x 1.1 cm, previously 1.7 x 0.9 cm (series 2, image 42). Thyroid gland, trachea, and esophagus demonstrate no significant findings. Lungs/Pleura: New moderate  right pleural effusion with diffuse, nodular, enhancing pleural thickening (series 2, image 29). Proximal obstruction of the right upper lobe segmental bronchi with complete atelectasis or consolidation of the  right upper lobe, new compared to prior examination (series 2, image 19). Interval enlargement of a subpleural mass of the dependent superior segment left lower lobe, measuring 2.4 x 2.1 cm, previously 1.8 x 1.2 cm (series 3, image 68). Severe underlying emphysema. Musculoskeletal: No chest wall abnormality. No acute osseous findings. CT ABDOMEN PELVIS FINDINGS Hepatobiliary: No solid liver abnormality is seen. No gallstones, gallbladder wall thickening, or biliary dilatation. Pancreas: Unremarkable. No pancreatic ductal dilatation or surrounding inflammatory changes. Spleen: Normal in size without significant abnormality. Adrenals/Urinary Tract: Adrenal glands are unremarkable. Kidneys are normal, without renal calculi, solid lesion, or hydronephrosis. Bladder is unremarkable. Stomach/Bowel: Stomach is within normal limits. Appendix appears normal. No evidence of bowel wall thickening, distention, or inflammatory changes. Pancolonic diverticulosis. Moderate burden of stool throughout the colon. Vascular/Lymphatic: Aortic atherosclerosis. No enlarged abdominal or pelvic lymph nodes. Reproductive: Prostatomegaly. Other: No abdominal wall hernia or abnormality. No ascites. Musculoskeletal: No acute osseous findings. Unchanged sclerotic, trabeculated vertebral body hemangiomata of T10 and L2 (series 6, image 100). No suspicious osseous lesions. 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 IMPRESSION: 1. New moderate right pleural effusion with diffuse, nodular, enhancing pleural thickening consistent with pleural metastatic disease. 2. Proximal obstruction of the right upper lobe segmental bronchi with complete atelectasis or consolidation of the right upper lobe, consistent with local malignant recurrence and postobstructive atelectasis of the upper lobe. 3. Interval increase in circumferential soft tissue thickening about the right hilum. Slight interval increase in size in enlarged AP  window lymph nodes. 4. Interval enlargement of a subpleural mass of the dependent superior segment left lower lobe consistent with an enlarging pulmonary metastasis or metachronous primary lung malignancy. 5. Emphysema. 6. Coronary artery disease. 7. Prostatomegaly. These results will be called to the ordering clinician or representative by the Radiologist Assistant, and communication documented in the PACS or [employer]. Aortic Atherosclerosis (ICD10-I70.0) and Emphysema (ICD10-J43.9). Electronically Signed   By: Jearld Lesch M.D.   On: 11/16/2022 17:13

## 2023-01-22 NOTE — Assessment & Plan Note (Signed)
low-dose lorazepam 0.5 mg every 12 hours as needed for anxiety

## 2023-01-22 NOTE — Patient Instructions (Signed)

## 2023-01-22 NOTE — Assessment & Plan Note (Addendum)
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  Brain MRI is negative for metastasis. Thoracentesis cytology and biopsy via bronchoscopy both confirm adenocarcinoma Recurrent stage IV lung adenocarcinoma. Recommend systemic chemotherapy plus immunotherapy. [facility] NGS/PD-L1 showed KEAP1 missense, TP53 missense, SMARCA4 frameshift, PTPRD copy loss, no EGFR, KRAS, BRAF, ALK, ROS1 RET MET ERBB mutations.  TMB 18.9 m/mb, MSS, PD-L1 <1% recommend systemic chemotherapy with carboplatin/Taxol/bevacizumab.   No actionable mutation on NGS. PDL-1<1%, TMB 18.9   Labs are reviewed and discussed with patient. Overall he tolerates chemotherapy.  Proceed with carboplatin/Taxol/bevacizumab Consider add immunotherapy in the future  Follow up with Dr. Rushie Chestnut  for palliative radiation for occluded right upper lobe, he finishes RT on 7/24

## 2023-01-22 NOTE — Assessment & Plan Note (Addendum)
Medi port placed, small area of skin opening..  Will avoid using medi port, use peripheral line for treatment, I shared the picture of the port site and discussed with Dr. Wyn Quaker.  He recommends close monitor.  He will come back in 1 week to see NP to check port status.

## 2023-01-22 NOTE — Assessment & Plan Note (Signed)
Follow up with nutritionist.  

## 2023-01-22 NOTE — Assessment & Plan Note (Signed)
Treatment plan as listed above. 

## 2023-01-22 NOTE — Assessment & Plan Note (Signed)
Palliative therapeutic thoracentesis as needed.  Expect to improve with chemotherapy treatment.  Consider pleural catheter if refractory to treatment.  Repeat CXR today

## 2023-01-22 NOTE — Progress Notes (Signed)
Per Dr Cathie Hoops and Dr Wyn Quaker, okay to administer MVASI with wound present over [facility].

## 2023-01-23 ENCOUNTER — Ambulatory Visit: Admission: RE | Admit: 2023-01-23 | Payer: Medicare Other | Source: Ambulatory Visit

## 2023-01-23 ENCOUNTER — Other Ambulatory Visit: Payer: Self-pay

## 2023-01-23 DIAGNOSIS — Z510 Encounter for antineoplastic radiation therapy: Secondary | ICD-10-CM | POA: Diagnosis not present

## 2023-01-23 LAB — RAD ONC ARIA SESSION SUMMARY
Course Elapsed Days: 29
Plan Fractions Treated to Date: 20
Plan Prescribed Dose Per Fraction: 2 Gy
Plan Total Fractions Prescribed: 20
Plan Total Prescribed Dose: 40 Gy
Reference Point Dosage Given to Date: 40 Gy
Reference Point Session Dosage Given: 2 Gy
Session Number: 20

## 2023-01-23 NOTE — Assessment & Plan Note (Signed)
blood glucose control important in reducing the progression of atherosclerotic disease. Also, involved in wound healing. On appropriate medications.  

## 2023-01-23 NOTE — Assessment & Plan Note (Signed)
At this point, the port does not appear infected but his soft tissue is minimal and his healing potential is very poor.  For now, I would rest the port for the next week or 2 and we will plan to see him back in the office to reassess the port to see if it can be used at that time.

## 2023-01-23 NOTE — Assessment & Plan Note (Signed)
blood pressure control important in reducing the progression of atherosclerotic disease. On appropriate oral medications.  

## 2023-01-23 NOTE — Assessment & Plan Note (Signed)
Follows with oncology

## 2023-01-28 ENCOUNTER — Encounter: Payer: Self-pay | Admitting: Oncology

## 2023-01-29 ENCOUNTER — Ambulatory Visit (INDEPENDENT_AMBULATORY_CARE_PROVIDER_SITE_OTHER): Payer: Medicare Other | Admitting: Nurse Practitioner

## 2023-01-29 ENCOUNTER — Encounter (INDEPENDENT_AMBULATORY_CARE_PROVIDER_SITE_OTHER): Payer: Self-pay | Admitting: Nurse Practitioner

## 2023-01-29 VITALS — BP 161/91 | HR 80 | Resp 16 | Wt 207.8 lb

## 2023-01-29 DIAGNOSIS — E1169 Type 2 diabetes mellitus with other specified complication: Secondary | ICD-10-CM

## 2023-01-29 DIAGNOSIS — Z95828 Presence of other vascular implants and grafts: Secondary | ICD-10-CM

## 2023-01-29 MED ORDER — MUPIROCIN 2 % EX OINT: 1.0000 | TOPICAL_OINTMENT | Freq: Every day | CUTANEOUS | 3 refills | Status: DC

## 2023-01-29 NOTE — Progress Notes (Signed)
Subjective:    Patient ID: Mark Mccarty, male    DOB: February 18, 1951, 72 y.o.   MRN: [id number] Chief Complaint  Patient presents with   Wound Check    1 week follow up    Patient returns today in follow up of his port a cath.  Placed about 3 weeks previous for chemotherapy.  It continues to be bruised and somewhat reddened but there is also a small pinhole at the center of the area.  It is not healed since the last visit.  However the portion of the port itself is not visible.  The patient has not utilized anything for treatment of it.  He denies that it has been doing a drainage recently.  It continues to remain somewhat tender.      Review of Systems  Skin:  Positive for wound.  All other systems reviewed and are negative.      Objective:   Physical Exam Vitals reviewed.  [name]:     Head: Normocephalic.  Cardiovascular:     Rate and Rhythm: Normal rate.     Pulses:          Radial pulses are 1+ on the right side and 1+ on the left side.  Pulmonary:     Effort: Pulmonary effort is normal.  Skin:    General: Skin is warm and dry.  Neurological:     Mental Status: He is alert and oriented to person, place, and time.  Psychiatric:        Mood and Affect: Mood normal.        Behavior: Behavior normal.        Thought Content: Thought content normal.        Judgment: Judgment normal.     BP (!) 161/91 (BP Location: Left Arm)   Pulse 80   Resp 16   Wt 207 lb 12.8 oz (94.3 kg)   BMI 28.18 kg/m   Past Medical History:  Diagnosis Date   Abnormal nuclear stress test    Anxiety    a.) on BZO (lorazepam) PRN   Asthma    Avascular necrosis of femoral head (HCC)    BPH (benign prostatic hyperplasia)    Cataract    a.) s/p extraction on LEFT   Colon adenomas    COPD (chronic obstructive pulmonary disease) (HCC)    Coronary artery disease 11/11/2014   a.) MPI 11/11/2014: EF 56%, mild inf wall ischemia; b.) LHC 12/16/2014: 20% mRCA - med mgmt   DDD (degenerative  disc disease), lumbar    Diverticulosis    DM type 2 (diabetes mellitus, type 2) (HCC)    Duodenitis    Dysphagia    Dyspnea    ED (erectile dysfunction)    a.) on PDE5i (sildenafil) PRN   Elevated PSA    Former smoker    Gastritis    GERD (gastroesophageal reflux disease)    [name]    Hilar mass    History of hiatal hernia    Hyperlipidemia    Hypertension    Hyperuricemia    Hypokalemia    Left ventricular diastolic dysfunction 10/04/2016   a.) TTE 10/04/2016: EF 50%, mild MR/TR/PR, mod AR, G1DD; b.) TTE 10/10/2017: EF 45%, triv PR, mild AR/MR/TR, G1DD; c.) TTE 03/30/2020: EF 50%, mild-mod AR, mild MR/TR/PR; d.) TTE 04/23/2022: EF >55%, mild LVH, mild LAE, triv TR, mild PR, mod AR/MR   Liver lesion    Obesity    Pedal edema  Peripheral polyneuropathy    Peyronie's disease 05/2022   Pleural effusion    Primary lung adenocarcinoma, right (HCC)    a.) stage IIIA (cT3, cN1, cM0) --> s/p concurrent chemoradiation 2019 --> carboplatin + pacilitaxel (12/31/2017 - 02/19/2018) followed by 1 year (03/24/2018 - 03/17/2019) adjuvant anti-PDL-1 mAB immunotherapy (durvalumab)   [name] (peptic ulcer disease)    Renal insufficiency    Umbilical hernia    [name]     Social History   Socioeconomic History   Marital status: Divorced    Spouse name: Not on file   Number of children: Not on file   Years of education: Not on file   Highest education level: Not on file  Occupational History   Not on file  Tobacco Use   Smoking status: Former    Current packs/day: 0.00    Average packs/day: 1.5 packs/day for 35.0 years (52.5 ttl pk-yrs)    Types: Cigarettes    Start date: 06/21/1980    Quit date: 06/22/2015    Years since quitting: 7.6    Passive exposure: Past   Smokeless tobacco: Never  Vaping Use   Vaping status: Never Used  Substance and Sexual Activity   Alcohol use: Not Currently    Comment: weekends   Drug use: No   Sexual activity:  Not Currently  Other Topics Concern   Not on file  Social History Narrative   Lives with son Mark Mccarty.   Social Determinants of Health   Financial Resource Strain: Not on file  Food Insecurity: Not on file  Transportation Needs: Not on file  Physical Activity: Not on file  Stress: Not on file  Social Connections: Unknown (11/14/2021)   [facility]   Social Network    Social Network: Not on file  Intimate Partner Violence: Unknown (10/05/2021)   Received from [facility]   HITS    Physically Hurt: Not on file    Insult or Talk Down To: Not on file    Threaten Physical Harm: Not on file    Scream or Curse: Not on file    Past Surgical History:  Procedure Laterality Date   CARDIAC CATHETERIZATION N/A 12/16/2014   Procedure: Left Heart Cath;  Surgeon: Marcina Millard, MD;  Location: [facility]IVE [facility];  Service: Cardiovascular;  Laterality: N/A;   CARDIOVERSION N/A 01/17/2023   Procedure: CARDIOVERSION;  Surgeon: Clotilde Dieter, DO;  Location: [name];  Service: Cardiovascular;  Laterality: N/A;   CATARACT EXTRACTION Left    COLONOSCOPY     COLONOSCOPY WITH PROPOFOL N/A 01/14/2020   Procedure: COLONOSCOPY WITH PROPOFOL;  Surgeon: Toledo, Boykin Nearing, MD;  Location: [facility] ENDOSCOPY;  Service: Gastroenterology;  Laterality: N/A;   CYST REMOVAL TRUNK     chest and back over time and it was removed   CYSTECTOMY     ENDOBRONCHIAL ULTRASOUND N/A 12/09/2017   Procedure: ENDOBRONCHIAL ULTRASOUND;  Surgeon: Shane Crutch, MD;  Location: [name];  Service: Pulmonary;  Laterality: N/A;   PORTA CATH INSERTION N/A 12/23/2017   Procedure: PORTA CATH INSERTION;  Surgeon: Annice Needy, MD;  Location: [facility];  Service: Cardiovascular;  Laterality: N/A;   PORTA CATH INSERTION N/A 01/08/2023   Procedure: PORTA CATH INSERTION;  Surgeon: Annice Needy, MD;  Location: [facility];  Service: Cardiovascular;  Laterality: N/A;   PORTA CATH REMOVAL N/A  06/04/2022   Procedure: PORTA CATH REMOVAL;  Surgeon: Annice Needy, MD;  Location: [facility];  Service: Cardiovascular;  Laterality: N/A;   TEE WITHOUT CARDIOVERSION N/A 01/17/2023   Procedure: TRANSESOPHAGEAL ECHOCARDIOGRAM (TEE);  Surgeon: Clotilde Dieter, DO;  Location: [name];  Service: Cardiovascular;  Laterality: N/A;   UPPER GI ENDOSCOPY     VIDEO BRONCHOSCOPY WITH ENDOBRONCHIAL ULTRASOUND N/A 12/17/2022   Procedure: VIDEO BRONCHOSCOPY WITH ENDOBRONCHIAL ULTRASOUND;  Surgeon: Vida Rigger, MD;  Location: [name];  Service: Thoracic;  Laterality: N/A;    Family History  Problem Relation Age of Onset   Hypertension Mother    Breast cancer Mother    Heart attack Mother    Lung cancer Father    Heart disease Sister    Diabetes Sister    Colon cancer Sister     Allergies  Allergen Reactions   Penicillins Rash    Stomach hurt Has patient had a PCN reaction causing immediate rash, facial/tongue/throat swelling, SOB or lightheadedness with hypotension: yes Has patient had a PCN reaction causing severe rash involving mucus membranes or skin necrosis: no Has patient had a PCN reaction that required hospitalization: no Has patient had a PCN reaction occurring within the last 10 years: yes If all of the above answers are "NO", then may proceed with Cephalosporin use.        Latest Ref Rng & Units 01/22/2023   10:21 AM 01/16/2023   10:16 AM 01/08/2023   10:36 AM  CBC  WBC 4.0 - 10.5 K/uL 10.8  4.4  4.9   Hemoglobin 13.0 - 17.0 g/dL 04.5  40.9  81.1   Hematocrit 39.0 - 52.0 % 46.0  43.4  44.7   Platelets 150 - 400 K/uL 144  282  267       CMP     Component Value Date/Time   NA 138 01/22/2023 1021   NA 138 07/10/2014 1328   K 4.3 01/22/2023 1021   K 3.1 (L) 07/10/2014 1328   CL 100 01/22/2023 1021   CL 101 07/10/2014 1328   CO2 30 01/22/2023 1021   CO2 31 07/10/2014 1328   GLUCOSE 105 (H) 01/22/2023 1021   GLUCOSE 164 (H) 07/10/2014 1328   BUN 13  01/22/2023 1021   BUN 14 07/10/2014 1328   CREATININE 0.81 01/22/2023 1021   CREATININE 1.16 07/10/2014 1328   CALCIUM 8.6 (L) 01/22/2023 1021   CALCIUM 8.9 07/10/2014 1328   PROT 6.2 (L) 01/22/2023 1021   PROT 7.4 07/10/2014 1328   ALBUMIN 3.1 (L) 01/22/2023 1021   ALBUMIN 3.4 07/10/2014 1328   AST 17 01/22/2023 1021   ALT 24 01/22/2023 1021   ALT 19 07/10/2014 1328   ALKPHOS 58 01/22/2023 1021   ALKPHOS 60 07/10/2014 1328   BILITOT 0.4 01/22/2023 1021   GFRNONAA >60 01/22/2023 1021   GFRNONAA >60 07/10/2014 1328     No results found.     Assessment & Plan:   1. Port-A-Cath in place Recommended that the patient place any present on the ointment and cover with gauze to see if this helps improvement of the wound itself.  The patient has been getting peripheral IVs for his chemotherapy sessions and currently this is working well.  However if they are unable to obtain peripheral IV access or if his port becomes visible through the current wound that he has and it will be necessary to extract, possibly move the port to the opposite side of his chest.  In the absence of these issues, we will try to allow for several more weeks for the wound to heal.  Will plan on having him return in 3 weeks.  2. Type 2 diabetes mellitus with other specified complication, unspecified whether long term insulin use (HCC) Continue hypoglycemic medications as already ordered, these medications have been reviewed and there are no changes at this time.  Hgb A1C to be monitored as already arranged by primary service   Current Outpatient Medications on File Prior to Visit  Medication Sig Dispense Refill   albuterol (VENTOLIN HFA) 108 (90 Base) MCG/ACT inhaler Inhale 2 puffs into the lungs every 4 (four) hours as needed.     amLODipine (NORVASC) 10 MG tablet Take 10 mg by mouth every morning.     ascorbic acid (VITAMIN C) 1000 MG tablet Take 1,000 mg by mouth daily.     aspirin 81 MG tablet Take 81 mg by  mouth daily.     carvedilol (COREG) 25 MG tablet Take 25 mg by mouth 2 (two) times daily with a meal.     cholecalciferol (VITAMIN D3) 25 MCG (1000 UNIT) tablet Take 1,000 Units by mouth daily.     Fluticasone-Umeclidin-Vilant (TRELEGY ELLIPTA) 200-62.5-25 MCG/ACT AEPB Inhale 1 puff into the lungs every morning.     gabapentin (NEURONTIN) 300 MG capsule Take 300 mg by mouth at bedtime as needed.      ipratropium-albuterol (DUONEB) 0.5-2.5 (3) MG/3ML SOLN Take 3 mLs by nebulization every 6 (six) hours as needed.     lidocaine-prilocaine (EMLA) cream Apply to affected area once 30 g 3   lisinopril (PRINIVIL,ZESTRIL) 40 MG tablet Take 40 mg by mouth every morning.     LORazepam (ATIVAN) 0.5 MG tablet Take 1 tablet (0.5 mg total) by mouth every 12 (twelve) hours as needed for anxiety. 30 tablet 0   potassium chloride (KLOR-CON) 10 MEQ tablet Take 1 tablet (10 mEq total) by mouth daily. (Patient taking differently: Take 10 mEq by mouth every morning.) 90 tablet 1   pravastatin (PRAVACHOL) 20 MG tablet Take 20 mg by mouth at bedtime.     senna-docusate (SENOKOT-S) 8.6-50 MG tablet Take 2 tablets by mouth daily. 60 tablet 2   spironolactone (ALDACTONE) 25 MG tablet Take by mouth.     tamsulosin (FLOMAX) 0.4 MG CAPS capsule Take 0.4 mg by mouth daily after breakfast.     torsemide (DEMADEX) 20 MG tablet Take 20 mg by mouth every morning.     Vibegron (GEMTESA) 75 MG TABS Take 1 tablet (75 mg total) by mouth daily. (Patient taking differently: Take 1 tablet by mouth every morning.) 30 tablet 0   acetaminophen (TYLENOL) 500 MG tablet Take 500 mg by mouth as needed. (Patient not taking: Reported on 01/17/2023)     cetirizine (ZYRTEC) 10 MG tablet Take 1 tablet by mouth as needed. (Patient not taking: Reported on 01/17/2023)     dexamethasone (DECADRON) 4 MG tablet Take 2 tablets (8mg ) by mouth daily starting the day after carboplatin for 3 days. Take with food (Patient not taking: Reported on 01/17/2023) 30  tablet 1   ibuprofen (ADVIL,MOTRIN) 600 MG tablet Take 600 mg by mouth every 6 (six) hours as needed. (Patient not taking: Reported on 01/17/2023)     ondansetron (ZOFRAN) 8 MG tablet Take 1 tablet (8 mg total) by mouth every 8 (eight) hours as needed for nausea or vomiting. Start on the third day after carboplatin. (Patient not taking: Reported on 01/22/2023) 30 tablet 1   prochlorperazine (COMPAZINE) 10 MG tablet Take 1 tablet (10 mg total) by mouth every 6 (six) hours as needed  for nausea or vomiting. (Patient not taking: Reported on 01/22/2023) 30 tablet 1   sildenafil (VIAGRA) 50 MG tablet Take 1-2 tablets (50-100 mg total) by mouth daily as needed for erectile dysfunction. (Patient not taking: Reported on 01/17/2023) 30 tablet 6   Current Facility-Administered Medications on File Prior to Visit  Medication Dose Route Frequency Provider Last Rate Last Admin   sodium chloride flush (NS) 0.9 % injection 10 mL  10 mL Intravenous PRN [name], MD   10 mL at 03/18/18 0858    There are no Patient Instructions on file for this visit. No follow-ups on file.   Georgiana Spinner, NP

## 2023-01-30 ENCOUNTER — Inpatient Hospital Stay: Payer: Medicare Other

## 2023-01-30 ENCOUNTER — Inpatient Hospital Stay: Payer: Medicare Other | Admitting: Medical Oncology

## 2023-01-30 ENCOUNTER — Encounter: Payer: Self-pay | Admitting: Medical Oncology

## 2023-01-30 VITALS — BP 164/96 | HR 78 | Temp 98.5°F | Wt 204.0 lb

## 2023-01-30 DIAGNOSIS — D696 Thrombocytopenia, unspecified: Secondary | ICD-10-CM

## 2023-01-30 DIAGNOSIS — Z79899 Other long term (current) drug therapy: Secondary | ICD-10-CM | POA: Diagnosis not present

## 2023-01-30 DIAGNOSIS — C3491 Malignant neoplasm of unspecified part of right bronchus or lung: Secondary | ICD-10-CM

## 2023-01-30 DIAGNOSIS — Z95828 Presence of other vascular implants and grafts: Secondary | ICD-10-CM

## 2023-01-30 DIAGNOSIS — Z510 Encounter for antineoplastic radiation therapy: Secondary | ICD-10-CM | POA: Diagnosis not present

## 2023-01-30 LAB — CBC WITH DIFFERENTIAL (CANCER CENTER ONLY)
Abs Immature Granulocytes: 0 10*3/uL (ref 0.00–0.07)
Basophils Absolute: 0 10*3/uL (ref 0.0–0.1)
Basophils Relative: 0 %
Eosinophils Absolute: 0 10*3/uL (ref 0.0–0.5)
Eosinophils Relative: 1 %
HCT: 39.7 % (ref 39.0–52.0)
Hemoglobin: 13.4 g/dL (ref 13.0–17.0)
Immature Granulocytes: 0 %
Lymphocytes Relative: 9 %
Lymphs Abs: 0.2 10*3/uL — ABNORMAL LOW (ref 0.7–4.0)
MCH: 28.2 pg (ref 26.0–34.0)
MCHC: 33.8 g/dL (ref 30.0–36.0)
MCV: 83.4 fL (ref 80.0–100.0)
Monocytes Absolute: 0.1 10*3/uL (ref 0.1–1.0)
Monocytes Relative: 5 %
Neutro Abs: 2.2 10*3/uL (ref 1.7–7.7)
Neutrophils Relative %: 85 %
Platelet Count: 70 10*3/uL — ABNORMAL LOW (ref 150–400)
RBC: 4.76 MIL/uL (ref 4.22–5.81)
RDW: 13.8 % (ref 11.5–15.5)
WBC Count: 2.6 10*3/uL — ABNORMAL LOW (ref 4.0–10.5)
nRBC: 0 % (ref 0.0–0.2)

## 2023-01-30 NOTE — Progress Notes (Signed)
Hematology and Oncology Follow Up Visit  Mark Mccarty [id number] [birth year] 72 y.o. 01/30/2023  Past Medical History:  Diagnosis Date   Abnormal nuclear stress test    Anxiety    a.) on BZO (lorazepam) PRN   Asthma    Avascular necrosis of femoral head (HCC)    BPH (benign prostatic hyperplasia)    Cataract    a.) s/p extraction on LEFT   Colon adenomas    COPD (chronic obstructive pulmonary disease) (HCC)    Coronary artery disease 11/11/2014   a.) MPI 11/11/2014: EF 56%, mild inf wall ischemia; b.) LHC 12/16/2014: 20% mRCA - med mgmt   DDD (degenerative disc disease), lumbar    Diverticulosis    DM type 2 (diabetes mellitus, type 2) (HCC)    Duodenitis    Dysphagia    Dyspnea    ED (erectile dysfunction)    a.) on PDE5i (sildenafil) PRN   Elevated PSA    Former smoker    Gastritis    GERD (gastroesophageal reflux disease)    [name]    Hilar mass    History of hiatal hernia    Hyperlipidemia    Hypertension    Hyperuricemia    Hypokalemia    Left ventricular diastolic dysfunction 10/04/2016   a.) TTE 10/04/2016: EF 50%, mild MR/TR/PR, mod AR, G1DD; b.) TTE 10/10/2017: EF 45%, triv PR, mild AR/MR/TR, G1DD; c.) TTE 03/30/2020: EF 50%, mild-mod AR, mild MR/TR/PR; d.) TTE 04/23/2022: EF >55%, mild LVH, mild LAE, triv TR, mild PR, mod AR/MR   Liver lesion    Obesity    Pedal edema    Peripheral polyneuropathy    Peyronie's disease 05/2022   Pleural effusion    Primary lung adenocarcinoma, right (HCC)    a.) stage IIIA (cT3, cN1, cM0) --> s/p concurrent chemoradiation 2019 --> carboplatin + pacilitaxel (12/31/2017 - 02/19/2018) followed by 1 year (03/24/2018 - 03/17/2019) adjuvant anti-PDL-1 mAB immunotherapy (durvalumab)   [name] (peptic ulcer disease)    Renal insufficiency    Umbilical hernia    [name]     Principle Diagnosis:  Recurrent stage IV lung adenocarcinoma   Current Therapy:   Carboplatin/Taxol/bevacizumab - s/p  cycle 1 on 01/01/2023 Palliative XRT s/p weekly treatment 14 of 20 PriorTherapy:   History of stage IIIA cT3 N1 M0 lung adenocarcinoma, s/p concurrent chemoradiation [Aug 2019]. Finished 1 year of durvalumab treatments, Presumed left upper lobe lung cancer status post SBRT in October 2022   Interim History:  Mark Mccarty is back for follow-up   At his last visit with me on 01/16/2023 he expressed concerns regarding his port-a-cath. Port was placed on 01/08/2023. Port has not been used in clinic. At this visit he was referred back to his vascular surgeon for further evaluation. He is here today for recheck.   Today he reports that the port-a-cath isn't causing him any pain. Bruising is still present. Skin separation at port site has scabbed. He denies any new redness or any fevers. She was seen yesterday by Sheppard Plumber NP -vascular surgery. At this visit he was given a cream to apply to the port site. It was discussed that if port site did not improve or peripheral access could not successfully be made that revision would be suggested. He has not picked up the mupirocin ointment that was sent in but is planning on picking it up after our visit today.      Wt Readings from Last 3 Encounters:  01/30/23  204 lb (92.5 kg)  01/29/23 207 lb 12.8 oz (94.3 kg)  01/22/23 205 lb 9.6 oz (93.3 kg)     Medications:   Current Outpatient Medications:    albuterol (VENTOLIN HFA) 108 (90 Base) MCG/ACT inhaler, Inhale 2 puffs into the lungs every 4 (four) hours as needed., Disp: , Rfl:    amLODipine (NORVASC) 10 MG tablet, Take 10 mg by mouth every morning., Disp: , Rfl:    ascorbic acid (VITAMIN C) 1000 MG tablet, Take 1,000 mg by mouth daily., Disp: , Rfl:    aspirin 81 MG tablet, Take 81 mg by mouth daily., Disp: , Rfl:    carvedilol (COREG) 25 MG tablet, Take 25 mg by mouth 2 (two) times daily with a meal., Disp: , Rfl:    cholecalciferol (VITAMIN D3) 25 MCG (1000 UNIT) tablet, Take 1,000 Units by mouth  daily., Disp: , Rfl:    Fluticasone-Umeclidin-Vilant (TRELEGY ELLIPTA) 200-62.5-25 MCG/ACT AEPB, Inhale 1 puff into the lungs every morning., Disp: , Rfl:    gabapentin (NEURONTIN) 300 MG capsule, Take 300 mg by mouth at bedtime as needed. , Disp: , Rfl:    ipratropium-albuterol (DUONEB) 0.5-2.5 (3) MG/3ML SOLN, Take 3 mLs by nebulization every 6 (six) hours as needed., Disp: , Rfl:    lidocaine-prilocaine (EMLA) cream, Apply to affected area once, Disp: 30 g, Rfl: 3   lisinopril (PRINIVIL,ZESTRIL) 40 MG tablet, Take 40 mg by mouth every morning., Disp: , Rfl:    LORazepam (ATIVAN) 0.5 MG tablet, Take 1 tablet (0.5 mg total) by mouth every 12 (twelve) hours as needed for anxiety., Disp: 30 tablet, Rfl: 0   mupirocin ointment (BACTROBAN) 2 %, Apply 1 Application topically daily., Disp: 22 g, Rfl: 3   potassium chloride (KLOR-CON) 10 MEQ tablet, Take 1 tablet (10 mEq total) by mouth daily. (Patient taking differently: Take 10 mEq by mouth every morning.), Disp: 90 tablet, Rfl: 1   pravastatin (PRAVACHOL) 20 MG tablet, Take 20 mg by mouth at bedtime., Disp: , Rfl:    senna-docusate (SENOKOT-S) 8.6-50 MG tablet, Take 2 tablets by mouth daily., Disp: 60 tablet, Rfl: 2   spironolactone (ALDACTONE) 25 MG tablet, Take by mouth., Disp: , Rfl:    tamsulosin (FLOMAX) 0.4 MG CAPS capsule, Take 0.4 mg by mouth daily after breakfast., Disp: , Rfl:    torsemide (DEMADEX) 20 MG tablet, Take 20 mg by mouth every morning., Disp: , Rfl:    Vibegron (GEMTESA) 75 MG TABS, Take 1 tablet (75 mg total) by mouth daily. (Patient taking differently: Take 1 tablet by mouth every morning.), Disp: 30 tablet, Rfl: 0   acetaminophen (TYLENOL) 500 MG tablet, Take 500 mg by mouth as needed. (Patient not taking: Reported on 01/17/2023), Disp: , Rfl:    cetirizine (ZYRTEC) 10 MG tablet, Take 1 tablet by mouth as needed. (Patient not taking: Reported on 01/17/2023), Disp: , Rfl:    dexamethasone (DECADRON) 4 MG tablet, Take 2 tablets  (8mg ) by mouth daily starting the day after carboplatin for 3 days. Take with food (Patient not taking: Reported on 01/17/2023), Disp: 30 tablet, Rfl: 1   ibuprofen (ADVIL,MOTRIN) 600 MG tablet, Take 600 mg by mouth every 6 (six) hours as needed. (Patient not taking: Reported on 01/17/2023), Disp: , Rfl:    ondansetron (ZOFRAN) 8 MG tablet, Take 1 tablet (8 mg total) by mouth every 8 (eight) hours as needed for nausea or vomiting. Start on the third day after carboplatin. (Patient not taking: Reported on 01/22/2023), Disp: 30  tablet, Rfl: 1   prochlorperazine (COMPAZINE) 10 MG tablet, Take 1 tablet (10 mg total) by mouth every 6 (six) hours as needed for nausea or vomiting. (Patient not taking: Reported on 01/22/2023), Disp: 30 tablet, Rfl: 1   sildenafil (VIAGRA) 50 MG tablet, Take 1-2 tablets (50-100 mg total) by mouth daily as needed for erectile dysfunction. (Patient not taking: Reported on 01/17/2023), Disp: 30 tablet, Rfl: 6 No current facility-administered medications for this visit.  Facility-Administered Medications Ordered in Other Visits:    sodium chloride flush (NS) 0.9 % injection 10 mL, 10 mL, Intravenous, PRN, [name], MD, 10 mL at 03/18/18 0858  Allergies:  Allergies  Allergen Reactions   Penicillins Rash    Stomach hurt Has patient had a PCN reaction causing immediate rash, facial/tongue/throat swelling, SOB or lightheadedness with hypotension: yes Has patient had a PCN reaction causing severe rash involving mucus membranes or skin necrosis: no Has patient had a PCN reaction that required hospitalization: no Has patient had a PCN reaction occurring within the last 10 years: yes If all of the above answers are "NO", then may proceed with Cephalosporin use.     Past Medical History, Surgical history, Social history, and Family History were reviewed and updated.  Review of Systems: Review of Systems  Constitutional:  Negative for fever and unexpected weight change.   Respiratory:  Positive for shortness of breath. Negative for chest tightness.   Cardiovascular:  Negative for chest pain, leg swelling and palpitations.  Gastrointestinal:  Negative for constipation, diarrhea, nausea and vomiting.  Skin:  Positive for wound.     Physical Exam:  weight is 204 lb (92.5 kg). His tympanic temperature is 98.5 F (36.9 C). His blood pressure is 164/96 (abnormal) and his pulse is 78. His oxygen saturation is 98%.   Physical Exam Vitals and nursing note reviewed.  Constitutional:      General: He is not in acute distress.    Appearance: Normal appearance. He is not ill-appearing, toxic-appearing or diaphoretic.  [name]:     Head: Normocephalic and atraumatic.  Pulmonary:     Effort: Pulmonary effort is normal.  Musculoskeletal:     Right lower leg: No edema.     Left lower leg: No edema.  Skin:    General: Skin is warm.     Coloration: Skin is not pale.     Comments: See below  Neurological:     Mental Status: He is alert.   (01/16/2023) below  (01/30/2023) below   Lab Results  Component Value Date   WBC 2.6 (L) 01/30/2023   HGB 13.4 01/30/2023   HCT 39.7 01/30/2023   MCV 83.4 01/30/2023   PLT 70 (L) 01/30/2023     Chemistry      Component Value Date/Time   NA 138 01/22/2023 1021   NA 138 07/10/2014 1328   K 4.3 01/22/2023 1021   K 3.1 (L) 07/10/2014 1328   CL 100 01/22/2023 1021   CL 101 07/10/2014 1328   CO2 30 01/22/2023 1021   CO2 31 07/10/2014 1328   BUN 13 01/22/2023 1021   BUN 14 07/10/2014 1328   CREATININE 0.81 01/22/2023 1021   CREATININE 1.16 07/10/2014 1328      Component Value Date/Time   CALCIUM 8.6 (L) 01/22/2023 1021   CALCIUM 8.9 07/10/2014 1328   ALKPHOS 58 01/22/2023 1021   ALKPHOS 60 07/10/2014 1328   AST 17 01/22/2023 1021   ALT 24 01/22/2023 1021   ALT 19 07/10/2014 1328  BILITOT 0.4 01/22/2023 1021      Assessment and Plan- Patient is a 72 y.o. male    Encounter Diagnoses  Name Primary?    Port-A-Cath in place Yes   On antineoplastic chemotherapy    Thrombocytopenia (HCC)    I am happy to see that he was seen by vascular surgery regarding his port-a-cath. I agree with their plan. Today in office the area looks to be healing. We discussed the plan from vascular. Discussed red flag signs and symptoms. We also discussed his CBC results which showed a WBC of 2.6, ANC of 2.2, platelets of 70, Hgb of 13.4. Slight reduction in WBC and thrombocytopenia secondary to chemotherapy; appropriate for where he is in timeframe. Reviewed precautions. These are expected to normalize with time. We will recheck labs at his next scheduled visit.   Disposition: Keep 02/12/2023 visit as scheduled   Clent Jacks PA-C 7/31/202410:27 AM

## 2023-02-01 ENCOUNTER — Other Ambulatory Visit: Payer: Self-pay

## 2023-02-01 ENCOUNTER — Telehealth: Payer: Self-pay

## 2023-02-01 ENCOUNTER — Other Ambulatory Visit: Admission: RE | Admit: 2023-02-01 | Payer: Medicare Other | Source: Home / Self Care

## 2023-02-01 ENCOUNTER — Other Ambulatory Visit: Payer: Self-pay | Admitting: Oncology

## 2023-02-01 ENCOUNTER — Ambulatory Visit
Admission: RE | Admit: 2023-02-01 | Discharge: 2023-02-01 | Disposition: A | Discharge status: Home / Self Care | Payer: Medicare Other | Source: Ambulatory Visit | Attending: Student | Admitting: Student

## 2023-02-01 ENCOUNTER — Ambulatory Visit
Admission: RE | Admit: 2023-02-01 | Discharge: 2023-02-01 | Disposition: A | Discharge status: Home / Self Care | Payer: Medicare Other | Source: Ambulatory Visit | Attending: Oncology | Admitting: Oncology

## 2023-02-01 DIAGNOSIS — J90 Pleural effusion, not elsewhere classified: Secondary | ICD-10-CM

## 2023-02-01 MED ORDER — LIDOCAINE HCL (PF) 1 % IJ SOLN
10.0000 mL | Freq: Once | INTRAMUSCULAR | Status: AC
  Administered 2023-02-01: 10 mL via INTRADERMAL
  Filled 2023-02-01: qty 10

## 2023-02-01 NOTE — Procedures (Signed)
PROCEDURE SUMMARY:  Successful image-guided right thoracentesis. Yielded 2.4 L of serosanguineous fluid. Pt tolerated procedure well. No immediate complications. EBL = trace   Specimen was not sent for labs. CXR ordered.  Please see imaging section of Epic for full dictation.  Kennieth Francois PA-C 02/01/2023 1:34 PM

## 2023-02-01 NOTE — Telephone Encounter (Signed)
Pt has been scheduled for thoracentesis today at 12:45p. Pt aware of appt.

## 2023-02-01 NOTE — Telephone Encounter (Signed)
Patient states he was sent two weeks ago to get an Xray on his lungs to see if he had fluid on his lungs. Patient believes he needs fluid drawn off because he is short of breathe.

## 2023-02-01 NOTE — Telephone Encounter (Addendum)
Order has been placed and request for thoracentesis has been sent to IR

## 2023-02-05 ENCOUNTER — Inpatient Hospital Stay: Payer: Medicare Other | Attending: Oncology

## 2023-02-05 ENCOUNTER — Ambulatory Visit
Admission: RE | Admit: 2023-02-05 | Discharge: 2023-02-05 | Disposition: A | Discharge status: Home / Self Care | Payer: Medicare Other | Source: Ambulatory Visit | Attending: Emergency Medicine | Admitting: Emergency Medicine

## 2023-02-05 VITALS — BP 157/84 | HR 91 | Temp 97.9°F | Resp 18

## 2023-02-05 DIAGNOSIS — Z79899 Other long term (current) drug therapy: Secondary | ICD-10-CM | POA: Insufficient documentation

## 2023-02-05 DIAGNOSIS — C782 Secondary malignant neoplasm of pleura: Secondary | ICD-10-CM | POA: Insufficient documentation

## 2023-02-05 DIAGNOSIS — N390 Urinary tract infection, site not specified: Secondary | ICD-10-CM

## 2023-02-05 DIAGNOSIS — Z87891 Personal history of nicotine dependence: Secondary | ICD-10-CM | POA: Insufficient documentation

## 2023-02-05 DIAGNOSIS — J90 Pleural effusion, not elsewhere classified: Secondary | ICD-10-CM | POA: Insufficient documentation

## 2023-02-05 DIAGNOSIS — C3411 Malignant neoplasm of upper lobe, right bronchus or lung: Secondary | ICD-10-CM | POA: Insufficient documentation

## 2023-02-05 DIAGNOSIS — Z7982 Long term (current) use of aspirin: Secondary | ICD-10-CM | POA: Insufficient documentation

## 2023-02-05 DIAGNOSIS — Z5112 Encounter for antineoplastic immunotherapy: Secondary | ICD-10-CM | POA: Insufficient documentation

## 2023-02-05 DIAGNOSIS — Z7952 Long term (current) use of systemic steroids: Secondary | ICD-10-CM | POA: Insufficient documentation

## 2023-02-05 DIAGNOSIS — F419 Anxiety disorder, unspecified: Secondary | ICD-10-CM | POA: Insufficient documentation

## 2023-02-05 DIAGNOSIS — Z5111 Encounter for antineoplastic chemotherapy: Secondary | ICD-10-CM | POA: Insufficient documentation

## 2023-02-05 LAB — URINALYSIS, W/ REFLEX TO CULTURE (INFECTION SUSPECTED)
Bilirubin Urine: NEGATIVE
Glucose, UA: NEGATIVE mg/dL
Ketones, ur: NEGATIVE mg/dL
Nitrite: NEGATIVE
Protein, ur: 30 mg/dL — AB
Specific Gravity, Urine: 1.015 (ref 1.005–1.030)
pH: 6 (ref 5.0–8.0)

## 2023-02-05 MED ORDER — SULFAMETHOXAZOLE-TRIMETHOPRIM 800-160 MG PO TABS: 1.0000 | ORAL_TABLET | Freq: Two times a day (BID) | ORAL | 0 refills | Status: AC

## 2023-02-05 MED ORDER — PHENAZOPYRIDINE HCL 200 MG PO TABS: 200.0000 mg | ORAL_TABLET | Freq: Three times a day (TID) | ORAL | 0 refills | Status: DC

## 2023-02-05 NOTE — Progress Notes (Signed)
Nutrition Follow-up:  Patient with recurrent lung cancer. Receiving MVASI, carboplatin and paclitaxel.    Spoke with patient via phone for nutrition follow-up.  Reports that his appetite is doing ok.  Ate ham and egg biscuit and gravy biscuit this am for breakfast with juice and has drank an ensure plus.  Supper last night ate 1/2 steak, little bit of sweet potato, squash and corn on the cob.  Says that his taste is off, food taste bland.  Has had some nausea and takes medication that helps him feel better    Medications: reviewed  Labs: reviewed  Anthropometrics:   Weight 204 lb on 7/31 206 lb on 7/10 216 lb on 6/7 217 lb on 5/14 225 lb on 11/14   NUTRITION DIAGNOSIS: Unintentional weight loss continues   INTERVENTION:  Continue 350 calorie oral nutrition supplement Continue high calorie, high protein foods Continue antiemetics    MONITORING, EVALUATION, GOAL: weight trends, intake   NEXT VISIT: ~ 4 weeks   B. Freida Busman, RD, LDN Registered Dietitian [phone]

## 2023-02-05 NOTE — ED Triage Notes (Signed)
Pt presents with urinary frequency x 1 week and he noticed blood in his urine today.

## 2023-02-05 NOTE — Discharge Instructions (Addendum)
Take the Bactrim twice daily for 7 days with food for treatment of urinary tract infection.  Use the Pyridium every 8 hours as needed for urinary discomfort.  This will turn your urine a bright red-orange.  Increase your oral fluid intake so that you increase your urine production and or flushing your urinary system.  Take an over-the-counter probiotic, such as Culturelle-Align-Activia, 1 hour after each dose of antibiotic to prevent diarrhea or yeast infections from forming.  We will culture urine and change the antibiotics if necessary.  Return for reevaluation, or see your primary care provider, for any new or worsening symptoms.

## 2023-02-05 NOTE — ED Provider Notes (Signed)
[facility]    CSN: [id number] Arrival date & time: 02/05/23  1849      History   Chief Complaint Chief Complaint  Patient presents with   Urinary Frequency    Blood in urine - Entered by patient    HPI ALBERTH FEEBACK is a 72 y.o. male.   HPI  72 year old male with a past medical history significant for primary lung adenocarcinoma, chemo induced neuropathy, pleural effusions, gout, elevated PSA, and ED presents for evaluation of UTI symptoms that started 1 and half to 2 weeks ago with urgency, frequency, and burning.  This morning he noticed blood in his urine.  He has not had any fever, nausea, or vomiting.  He recently had a pleural effusion that was drained and he denies having a catheter for that procedure.  He is a patient of [facility].  Past Medical History:  Diagnosis Date   Abnormal nuclear stress test    Anxiety    a.) on BZO (lorazepam) PRN   Asthma    Avascular necrosis of femoral head (HCC)    BPH (benign prostatic hyperplasia)    Cataract    a.) s/p extraction on LEFT   Colon adenomas    COPD (chronic obstructive pulmonary disease) (HCC)    Coronary artery disease 11/11/2014   a.) MPI 11/11/2014: EF 56%, mild inf wall ischemia; b.) LHC 12/16/2014: 20% mRCA - med mgmt   DDD (degenerative disc disease), lumbar    Diverticulosis    DM type 2 (diabetes mellitus, type 2) (HCC)    Duodenitis    Dysphagia    Dyspnea    ED (erectile dysfunction)    a.) on PDE5i (sildenafil) PRN   Elevated PSA    Former smoker    Gastritis    GERD (gastroesophageal reflux disease)    [name]    Hilar mass    History of hiatal hernia    Hyperlipidemia    Hypertension    Hyperuricemia    Hypokalemia    Left ventricular diastolic dysfunction 10/04/2016   a.) TTE 10/04/2016: EF 50%, mild MR/TR/PR, mod AR, G1DD; b.) TTE 10/10/2017: EF 45%, triv PR, mild AR/MR/TR, G1DD; c.) TTE 03/30/2020: EF 50%, mild-mod AR, mild MR/TR/PR; d.) TTE  04/23/2022: EF >55%, mild LVH, mild LAE, triv TR, mild PR, mod AR/MR   Liver lesion    Obesity    Pedal edema    Peripheral polyneuropathy    Peyronie's disease 05/2022   Pleural effusion    Primary lung adenocarcinoma, right (HCC)    a.) stage IIIA (cT3, cN1, cM0) --> s/p concurrent chemoradiation 2019 --> carboplatin + pacilitaxel (12/31/2017 - 02/19/2018) followed by 1 year (03/24/2018 - 03/17/2019) adjuvant anti-PDL-1 mAB immunotherapy (durvalumab)   [name] (peptic ulcer disease)    Renal insufficiency    Umbilical hernia    [name]     Patient Active Problem List   Diagnosis Date Noted   Encounter for antineoplastic chemotherapy 01/01/2023   Weight loss 01/01/2023   Pleural effusion 12/17/2022   Anxiety associated with cancer diagnosis (HCC) 11/21/2022   Port-A-Cath in place 03/15/2020   Liver lesion 03/15/2020   DDD (degenerative disc disease), lumbar 08/04/2019   Obesity (BMI 30.0-34.9) 08/04/2019   Umbilical hernia without obstruction and without gangrene 04/07/2019   Centrilobular emphysema (HCC) 03/18/2019   Peripheral polyneuropathy 03/18/2019   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   H/O adenomatous polyp of colon 01/20/2019   Avascular necrosis of femoral head (HCC) 12/10/2018  [name] 09/18/2018   Elevated PSA, less than 10 ng/ml 03/22/2018   Benign prostatic hyperplasia with urinary obstruction 03/19/2018   Benign prostatic hyperplasia with nocturia 03/19/2018   Hypokalemia 01/07/2018   Goals of care, counseling/discussion 12/13/2017   Primary lung adenocarcinoma, right (HCC) 12/13/2017   SOB (shortness of breath) on exertion 10/02/2017   Incomplete immunization status 09/14/2017   Pedal edema 12/04/2016   Hyperuricemia 05/02/2016   Type 2 diabetes mellitus with other specified complication (HCC) 05/02/2016   High risk medication use 09/12/2015   S/P cardiac catheterization 12/23/2014   Chest pain with high risk for  cardiac etiology 12/16/2014   Abnormal nuclear stress test 12/16/2014   Adenomatous polyp of colon 04/02/2014   Dysphagia 04/02/2014   Family history of colon cancer 03/06/2014   Gastroesophageal reflux 03/06/2014   Hyperlipidemia associated with type 2 diabetes mellitus (HCC) 03/06/2014   Hypertension associated with type 2 diabetes mellitus (HCC) 03/06/2014   Aortic insufficiency 01/20/2014   Erectile dysfunction 01/20/2014   Former smoker 01/20/2014   Irregular heart beat 01/20/2014   Left ventricular diastolic dysfunction 01/20/2014   Peptic ulcer disease 01/20/2014    Past Surgical History:  Procedure Laterality Date   CARDIAC CATHETERIZATION N/A 12/16/2014   Procedure: Left Heart Cath;  Surgeon: Marcina Millard, MD;  Location: [facility];  Service: Cardiovascular;  Laterality: N/A;   CARDIOVERSION N/A 01/17/2023   Procedure: CARDIOVERSION;  Surgeon: Clotilde Dieter, DO;  Location: [name];  Service: Cardiovascular;  Laterality: N/A;   CATARACT EXTRACTION Left    COLONOSCOPY     COLONOSCOPY WITH PROPOFOL N/A 01/14/2020   Procedure: COLONOSCOPY WITH PROPOFOL;  Surgeon: Toledo, Boykin Nearing, MD;  Location: [facility];  Service: Gastroenterology;  Laterality: N/A;   CYST REMOVAL TRUNK     chest and back over time and it was removed   CYSTECTOMY     ENDOBRONCHIAL ULTRASOUND N/A 12/09/2017   Procedure: ENDOBRONCHIAL ULTRASOUND;  Surgeon: Shane Crutch, MD;  Location: [name];  Service: Pulmonary;  Laterality: N/A;   PORTA CATH INSERTION N/A 12/23/2017   Procedure: PORTA CATH INSERTION;  Surgeon: Annice Needy, MD;  Location: [facility];  Service: Cardiovascular;  Laterality: N/A;   PORTA CATH INSERTION N/A 01/08/2023   Procedure: PORTA CATH INSERTION;  Surgeon: Annice Needy, MD;  Location: [facility];  Service: Cardiovascular;  Laterality: N/A;   PORTA CATH REMOVAL N/A 06/04/2022   Procedure: PORTA CATH REMOVAL;  Surgeon: Annice Needy, MD;   Location: [facility];  Service: Cardiovascular;  Laterality: N/A;   TEE WITHOUT CARDIOVERSION N/A 01/17/2023   Procedure: TRANSESOPHAGEAL ECHOCARDIOGRAM (TEE);  Surgeon: Clotilde Dieter, DO;  Location: [name];  Service: Cardiovascular;  Laterality: N/A;   UPPER GI ENDOSCOPY     VIDEO BRONCHOSCOPY WITH ENDOBRONCHIAL ULTRASOUND N/A 12/17/2022   Procedure: VIDEO BRONCHOSCOPY WITH ENDOBRONCHIAL ULTRASOUND;  Surgeon: Vida Rigger, MD;  Location: [name];  Service: Thoracic;  Laterality: N/A;       Home Medications    Prior to Admission medications   Medication Sig Start Date End Date Taking? Authorizing Provider  phenazopyridine (PYRIDIUM) 200 MG tablet Take 1 tablet (200 mg total) by mouth 3 (three) times daily. 02/05/23  Yes Becky Augusta, NP  sulfamethoxazole-trimethoprim (BACTRIM DS) 800-160 MG tablet Take 1 tablet by mouth 2 (two) times daily for 7 days. 02/05/23 02/12/23 Yes Becky Augusta, NP  acetaminophen (TYLENOL) 500 MG tablet Take 500 mg by mouth as needed. Patient not  taking: Reported on 01/17/2023    [provider]  albuterol (VENTOLIN HFA) 108 (90 Base) MCG/ACT inhaler Inhale 2 puffs into the lungs every 4 (four) hours as needed. 11/08/22   [provider]  amLODipine (NORVASC) 10 MG tablet Take 10 mg by mouth every morning. 09/11/17   [provider]  ascorbic acid (VITAMIN C) 1000 MG tablet Take 1,000 mg by mouth daily.    Mickey Farber, MD  aspirin 81 MG tablet Take 81 mg by mouth daily.    [provider]  carvedilol (COREG) 25 MG tablet Take 25 mg by mouth 2 (two) times daily with a meal.    [provider]  cetirizine (ZYRTEC) 10 MG tablet Take 1 tablet by mouth as needed. Patient not taking: Reported on 01/17/2023 10/25/22 10/25/23  [provider]  cholecalciferol (VITAMIN D3) 25 MCG (1000 UNIT) tablet Take 1,000 Units by mouth daily.    [provider]  dexamethasone (DECADRON) 4 MG tablet Take 2 tablets  (8mg ) by mouth daily starting the day after carboplatin for 3 days. Take with food Patient not taking: Reported on 01/17/2023 12/24/22   [name], MD  Fluticasone-Umeclidin-Vilant (TRELEGY ELLIPTA) 200-62.5-25 MCG/ACT AEPB Inhale 1 puff into the lungs every morning.    [provider]  gabapentin (NEURONTIN) 300 MG capsule Take 300 mg by mouth at bedtime as needed.     [provider]  ibuprofen (ADVIL,MOTRIN) 600 MG tablet Take 600 mg by mouth every 6 (six) hours as needed. Patient not taking: Reported on 01/17/2023    [provider]  ipratropium-albuterol (DUONEB) 0.5-2.5 (3) MG/3ML SOLN Take 3 mLs by nebulization every 6 (six) hours as needed.    [provider]  lidocaine-prilocaine (EMLA) cream Apply to affected area once 12/24/22   [name], MD  lisinopril (PRINIVIL,ZESTRIL) 40 MG tablet Take 40 mg by mouth every morning.    [provider]  LORazepam (ATIVAN) 0.5 MG tablet Take 1 tablet (0.5 mg total) by mouth every 12 (twelve) hours as needed for anxiety. 11/21/22   [name], MD  mupirocin ointment (BACTROBAN) 2 % Apply 1 Application topically daily. 01/29/23   Georgiana Spinner, NP  ondansetron (ZOFRAN) 8 MG tablet Take 1 tablet (8 mg total) by mouth every 8 (eight) hours as needed for nausea or vomiting. Start on the third day after carboplatin. Patient not taking: Reported on 01/22/2023 12/24/22   [name], MD  potassium chloride (KLOR-CON) 10 MEQ tablet Take 1 tablet (10 mEq total) by mouth daily. Patient taking differently: Take 10 mEq by mouth every morning. 05/18/22   [name], MD  pravastatin (PRAVACHOL) 20 MG tablet Take 20 mg by mouth at bedtime. 09/15/18   [provider]  prochlorperazine (COMPAZINE) 10 MG tablet Take 1 tablet (10 mg total) by mouth every 6 (six) hours as needed for nausea or vomiting. Patient not taking: Reported on 01/22/2023 12/24/22   [name], MD  senna-docusate (SENOKOT-S) 8.6-50 MG tablet Take 2 tablets by mouth  daily. 01/09/23   [name], MD  sildenafil (VIAGRA) 50 MG tablet Take 1-2 tablets (50-100 mg total) by mouth daily as needed for erectile dysfunction. Patient not taking: Reported on 01/17/2023 05/09/21   Sondra Come, MD  spironolactone (ALDACTONE) 25 MG tablet Take by mouth. 12/17/22 12/17/23  [provider]  tamsulosin (FLOMAX) 0.4 MG CAPS capsule Take 0.4 mg by mouth daily after breakfast. 03/14/17   [provider]  torsemide (DEMADEX) 20 MG tablet  Take 20 mg by mouth every morning.    [provider]  Vibegron (GEMTESA) 75 MG TABS Take 1 tablet (75 mg total) by mouth daily. Patient taking differently: Take 1 tablet by mouth every morning. 11/13/22   Sondra Come, MD    Family History Family History  Problem Relation Age of Onset   Hypertension Mother    Breast cancer Mother    Heart attack Mother    Lung cancer Father    Heart disease Sister    Diabetes Sister    Colon cancer Sister     Social History Social History   Tobacco Use   Smoking status: Former    Current packs/day: 0.00    Average packs/day: 1.5 packs/day for 35.0 years (52.5 ttl pk-yrs)    Types: Cigarettes    Start date: 06/21/1980    Quit date: 06/22/2015    Years since quitting: 7.6    Passive exposure: Past   Smokeless tobacco: Never  Vaping Use   Vaping status: Never Used  Substance Use Topics   Alcohol use: Not Currently    Comment: weekends   Drug use: No     Allergies   Penicillins   Review of Systems Review of Systems  Constitutional:  Negative for fever.  Gastrointestinal:  Negative for nausea and vomiting.  Genitourinary:  Positive for dysuria, frequency, hematuria and urgency.  Musculoskeletal:  Negative for back pain.     Physical Exam Triage Vital Signs ED Triage Vitals  Encounter Vitals Group     BP      Systolic BP Percentile      Diastolic BP Percentile      Pulse      Resp      Temp      Temp src      SpO2      Weight      Height       Head Circumference      Peak Flow      Pain Score      Pain Loc      Pain Education      Exclude from Growth Chart    No data found.  Updated Vital Signs BP (!) 157/84   Pulse 91   Temp 97.9 F (36.6 C) (Oral)   Resp 18   SpO2 94%   Visual Acuity Right Eye Distance:   Left Eye Distance:   Bilateral Distance:    Right Eye Near:   Left Eye Near:    Bilateral Near:     Physical Exam Vitals and nursing note reviewed.  Constitutional:      Appearance: Normal appearance. He is not ill-appearing.  [name]:     Head: Normocephalic and atraumatic.  Cardiovascular:     Rate and Rhythm: Normal rate and regular rhythm.     Pulses: Normal pulses.     Heart sounds: Normal heart sounds. No murmur heard.    No friction rub. No gallop.  Pulmonary:     Effort: Pulmonary effort is normal.     Breath sounds: Normal breath sounds. No wheezing, rhonchi or rales.  Abdominal:     Tenderness: There is no right CVA tenderness or left CVA tenderness.  Skin:    General: Skin is warm.     Capillary Refill: Capillary refill takes less than 2 seconds.  Neurological:     General: No focal deficit present.     Mental Status: He is alert and oriented to person, place, and  time.      UC Treatments / Results  Labs (all labs ordered are listed, but only abnormal results are displayed) Labs Reviewed  URINALYSIS, W/ REFLEX TO CULTURE (INFECTION SUSPECTED) - Abnormal; Notable for the following components:      Result Value   APPearance HAZY (*)    Hgb urine dipstick MODERATE (*)    Protein, ur 30 (*)    Leukocytes,Ua SMALL (*)    Bacteria, UA MANY (*)    All other components within normal limits  URINE CULTURE    EKG   Radiology No results found.  Procedures Procedures (including critical care time)  Medications Ordered in UC Medications - No data to display  Initial Impression / Assessment and Plan / UC Course  I have reviewed the triage vital signs and the nursing  notes.  Pertinent labs & imaging results that were available during my care of the patient were reviewed by me and considered in my medical decision making (see chart for details).   Patient is a nontoxic-appearing 72 year old male presenting for evaluation of 2 weeks worth of UTI symptoms as outlined in HPI above.  On exam he is able to speak in full sentence without dyspnea or tachypnea and his lung sounds are clear to auscultation all fields.  He has no CVA tenderness on exam.  He has had dysuria with urgency and frequency for 1 and half to 2 weeks and then noticed blood in his urine this morning.  He has contacted [facility] who advised him to come into the urgent care for urinalysis.  I will order a urinalysis with reflex to culture.  Urinalysis shows hazy appearance with moderate hemoglobin, 30 protein, small leukocyte esterase but is negative for nitrates.  Reflex microscopy shows 21-50 WBCs, 21-50 RBCs, many bacteria, and WBC clumps.  Urine will reflex to culture.  I will discharge patient home with diagnosis of UTI and start him on Bactrim twice daily for 7 days given that he has an allergy to penicillins.  I will also prescribe Pyridium that he can use for urinary discomfort.   Final Clinical Impressions(s) / UC Diagnoses   Final diagnoses:  Lower urinary tract infectious disease     Discharge Instructions      Take the Bactrim twice daily for 7 days with food for treatment of urinary tract infection.  Use the Pyridium every 8 hours as needed for urinary discomfort.  This will turn your urine a bright red-orange.  Increase your oral fluid intake so that you increase your urine production and or flushing your urinary system.  Take an over-the-counter probiotic, such as Culturelle-Align-Activia, 1 hour after each dose of antibiotic to prevent diarrhea or yeast infections from forming.  We will culture urine and change the antibiotics if necessary.  Return for  reevaluation, or see your primary care provider, for any new or worsening symptoms.      ED Prescriptions     Medication Sig Dispense Auth. Provider   sulfamethoxazole-trimethoprim (BACTRIM DS) 800-160 MG tablet Take 1 tablet by mouth 2 (two) times daily for 7 days. 14 tablet Becky Augusta, NP   phenazopyridine (PYRIDIUM) 200 MG tablet Take 1 tablet (200 mg total) by mouth 3 (three) times daily. 6 tablet Becky Augusta, NP      PDMP not reviewed this encounter.   Becky Augusta, NP 02/05/23 1921

## 2023-02-06 ENCOUNTER — Other Ambulatory Visit: Payer: Self-pay

## 2023-02-06 ENCOUNTER — Emergency Department
Admission: EM | Admit: 2023-02-06 | Discharge: 2023-02-06 | Disposition: A | Discharge status: Home / Self Care | Payer: Medicare Other | Attending: Emergency Medicine | Admitting: Emergency Medicine

## 2023-02-06 DIAGNOSIS — J449 Chronic obstructive pulmonary disease, unspecified: Secondary | ICD-10-CM | POA: Insufficient documentation

## 2023-02-06 DIAGNOSIS — R040 Epistaxis: Secondary | ICD-10-CM | POA: Diagnosis present

## 2023-02-06 DIAGNOSIS — I10 Essential (primary) hypertension: Secondary | ICD-10-CM | POA: Diagnosis not present

## 2023-02-06 DIAGNOSIS — Z7901 Long term (current) use of anticoagulants: Secondary | ICD-10-CM | POA: Diagnosis not present

## 2023-02-06 DIAGNOSIS — E119 Type 2 diabetes mellitus without complications: Secondary | ICD-10-CM | POA: Insufficient documentation

## 2023-02-06 DIAGNOSIS — Z85118 Personal history of other malignant neoplasm of bronchus and lung: Secondary | ICD-10-CM | POA: Diagnosis not present

## 2023-02-06 MED ORDER — OXYMETAZOLINE HCL 0.05 % NA SOLN
1.0000 | Freq: Once | NASAL | Status: AC
  Administered 2023-02-06: 1 via NASAL
  Filled 2023-02-06: qty 30

## 2023-02-06 NOTE — ED Triage Notes (Signed)
Pt on Eliquis

## 2023-02-06 NOTE — ED Notes (Signed)
Pt bleeding from nose, RN notified. Assisted pt with tissue. Noticed pt bleeding has decreased.

## 2023-02-06 NOTE — ED Provider Notes (Signed)
[facility] Provider Note    Event Date/Time   First MD Initiated Contact with Patient 02/06/23 1931     (approximate)   History   Chief Complaint Epistaxis   HPI  Mark Mccarty is a 72 y.o. male with past medical history of hypertension, diabetes, hyperlipidemia, atrial fibrillation on Eliquis, COPD, and lung cancer who presents to the ED complaining of epistaxis.  Patient reports that about 1 hour prior to arrival he noticed bleeding from the left side of his nose.  Bleeding initially seem to stop on its own, but patient called EMS when it started bleeding again.  He denies any associated pain and has not had any injuries to his nose.  He denies similar issues in the past.     Physical Exam   Triage Vital Signs: ED Triage Vitals  Encounter Vitals Group     BP      Systolic BP Percentile      Diastolic BP Percentile      Pulse      Resp      Temp      Temp src      SpO2      Weight      Height      Head Circumference      Peak Flow      Pain Score      Pain Loc      Pain Education      Exclude from Growth Chart     Most recent vital signs: Vitals:   02/06/23 2000 02/06/23 2100  BP: (!) 163/92 (!) 167/96  Pulse: 84   Resp: 19   Temp:    SpO2: 94% 90%    Constitutional: Alert and oriented. Eyes: Conjunctivae are normal. Head: Atraumatic. Nose: Dried blood in the left nare. Mouth/Throat: Mucous membranes are moist. Cardiovascular: Normal rate, regular rhythm. Grossly normal heart sounds.  2+ radial pulses bilaterally. Respiratory: Normal respiratory effort.  No retractions. Lungs CTAB. Gastrointestinal: Soft and nontender. No distention. Musculoskeletal: No lower extremity tenderness nor edema.  Neurologic:  Normal speech and language. No gross focal neurologic deficits are appreciated.    ED Results / Procedures / Treatments   Labs (all labs ordered are listed, but only abnormal results are displayed) Labs Reviewed -  No data to display  PROCEDURES:  Critical Care performed: No  Procedures   MEDICATIONS ORDERED IN ED: Medications  oxymetazoline (AFRIN) 0.05 % nasal spray 1 spray (1 spray Each Nare Given 02/06/23 1959)     IMPRESSION / MDM / ASSESSMENT AND PLAN / ED COURSE  I reviewed the triage vital signs and the nursing notes.                              72 y.o. male with a past medical history of hypertension, hyperlipidemia, diabetes, atrial fibrillation on Eliquis, lung cancer, and COPD who presents to the ED complaining of epistaxis from the left nare starting 1 hour prior to arrival.  Patient's presentation is most consistent with acute complicated illness / injury requiring diagnostic workup.  Differential diagnosis includes, but is not limited to, anterior epistaxis, posterior epistaxis, anemia.  Patient nontoxic-appearing and in no acute distress, vital signs are unremarkable.  He initially just had dried blood in the left nare with no active bleeding, but began to have some bleeding shortly after my initial evaluation.  Clot was cleared from the left nare  and Afrin applied, pressure held for about 10 minutes.  Patient now with no ongoing bleeding, will observe here in the ED for any recurrence.  No symptoms to suggest significant anemia at this time.  No recurrent bleeding after observation for greater than 1 hour, patient appropriate for discharge home with PCP follow-up.  He will be provided with Afrin to use as needed, was counseled to not use this for more than 3 days in a row.  His son and daughter are at bedside and are in agreement with plan, they were counseled to have him return to the ED for new or worsening symptoms.      FINAL CLINICAL IMPRESSION(S) / ED DIAGNOSES   Final diagnoses:  Epistaxis     Rx / DC Orders   ED Discharge Orders     None        Note:  This document was prepared using Dragon voice recognition software and may include unintentional  dictation errors.   Chesley Noon, MD 02/06/23 2127

## 2023-02-06 NOTE — ED Triage Notes (Signed)
Pt BIB [facility] from home for epistaxis, pt is a lung cancer pt and is currently on abx for a UTI. Pt HTN for EMS.

## 2023-02-11 ENCOUNTER — Ambulatory Visit
Admission: RE | Admit: 2023-02-11 | Discharge: 2023-02-11 | Disposition: A | Discharge status: Home / Self Care | Payer: Medicare Other | Source: Ambulatory Visit | Attending: Radiation Oncology | Admitting: Radiation Oncology

## 2023-02-11 ENCOUNTER — Encounter: Payer: Self-pay | Admitting: Radiation Oncology

## 2023-02-11 ENCOUNTER — Other Ambulatory Visit: Payer: Self-pay | Admitting: *Deleted

## 2023-02-11 VITALS — BP 120/63 | HR 84 | Temp 97.2°F | Resp 16 | Wt 192.8 lb

## 2023-02-11 DIAGNOSIS — C342 Malignant neoplasm of middle lobe, bronchus or lung: Secondary | ICD-10-CM | POA: Insufficient documentation

## 2023-02-11 DIAGNOSIS — C3491 Malignant neoplasm of unspecified part of right bronchus or lung: Secondary | ICD-10-CM

## 2023-02-11 MED FILL — Dexamethasone Sodium Phosphate Inj 100 MG/10ML: INTRAMUSCULAR | Qty: 1 | Status: AC

## 2023-02-11 MED FILL — Fosaprepitant Dimeglumine For IV Infusion 150 MG (Base Eq): INTRAVENOUS | Qty: 5 | Status: AC

## 2023-02-11 NOTE — Progress Notes (Signed)
Radiation Oncology Follow up Note  Name: Mark Mccarty   Date:   02/11/2023 MRN:  [id number] DOB: 1950/07/15    This 72 y.o. male presents to the clinic today for 2-week follow-up reevaluation and patient receiving 40 Gray in 20 fractions for recurrent adenocarcinoma of the right lung with obstruction of the right upper lobe.  REFERRING PROVIDER: [name], NP  HPI: Patient is a 72 year old male initially treated with concurrent chemoradiation therapy for stage IIIa (T3 N1 M0) adenocarcinoma of the lung.  He finished 1 year of Durvalumab.  Recent CT scan showed progression of disease and post obstructive changes. He did have a nosebleed recently seen in the ED for that.  Otherwise specifically denies any significant change in his pulmonary status.  COMPLICATIONS OF TREATMENT: none  FOLLOW UP COMPLIANCE: keeps appointments   PHYSICAL EXAM:  BP 120/63 (BP Location: Left Arm, Patient Position: Sitting)   Pulse 84   Temp (!) 97.2 F (36.2 C) (Tympanic)   Resp 16   Wt 192 lb 12.8 oz (87.5 kg) Comment: thoracentsis on 02/01/23 with 2.4 liters removed  SpO2 96%   BMI 26.15 kg/m  Well-developed male on nasal oxygen in NAD.  Well-developed well-nourished patient in NAD. HEENT reveals PERLA, EOMI, discs not visualized.  Oral cavity is clear. No oral mucosal lesions are identified. Neck is clear without evidence of cervical or supraclavicular adenopathy. Lungs are clear to A&P. Cardiac examination is essentially unremarkable with regular rate and rhythm without murmur rub or thrill. Abdomen is benign with no organomegaly or masses noted. Motor sensory and DTR levels are equal and symmetric in the upper and lower extremities. Cranial nerves II through XII are grossly intact. Proprioception is intact. No peripheral adenopathy or edema is identified. No motor or sensory levels are noted. Crude visual fields are within normal range.  RADIOLOGY RESULTS: CT scan of the chest is ordered  PLAN:  This time like to reevaluate him with a CT scan of the chest.  That time we will make any determination when he would benefit from any further palliative radiation therapy.  I will see him back in follow-up after his CT scan to make that determination.  He continues to treat with medical oncologywith carboplatin/Taxol/bevacizumab.  Patient comprehends my recommendations well.  I would like to take this opportunity to thank you for allowing me to participate in the care of your patient.[name], MD

## 2023-02-12 ENCOUNTER — Inpatient Hospital Stay: Payer: Medicare Other

## 2023-02-12 ENCOUNTER — Inpatient Hospital Stay (HOSPITAL_BASED_OUTPATIENT_CLINIC_OR_DEPARTMENT_OTHER): Payer: Medicare Other | Admitting: Oncology

## 2023-02-12 ENCOUNTER — Other Ambulatory Visit: Payer: Medicare Other

## 2023-02-12 ENCOUNTER — Encounter: Payer: Self-pay | Admitting: Oncology

## 2023-02-12 ENCOUNTER — Ambulatory Visit: Payer: Medicare Other | Admitting: Oncology

## 2023-02-12 ENCOUNTER — Other Ambulatory Visit: Payer: Self-pay

## 2023-02-12 ENCOUNTER — Ambulatory Visit: Payer: Medicare Other

## 2023-02-12 VITALS — BP 141/79 | HR 73 | Temp 96.0°F | Resp 18 | Wt 193.2 lb

## 2023-02-12 DIAGNOSIS — C3491 Malignant neoplasm of unspecified part of right bronchus or lung: Secondary | ICD-10-CM

## 2023-02-12 DIAGNOSIS — Z5112 Encounter for antineoplastic immunotherapy: Secondary | ICD-10-CM | POA: Diagnosis present

## 2023-02-12 DIAGNOSIS — C782 Secondary malignant neoplasm of pleura: Secondary | ICD-10-CM | POA: Diagnosis not present

## 2023-02-12 DIAGNOSIS — F411 Generalized anxiety disorder: Secondary | ICD-10-CM

## 2023-02-12 DIAGNOSIS — R972 Elevated prostate specific antigen [PSA]: Secondary | ICD-10-CM

## 2023-02-12 DIAGNOSIS — Z5111 Encounter for antineoplastic chemotherapy: Secondary | ICD-10-CM

## 2023-02-12 DIAGNOSIS — J90 Pleural effusion, not elsewhere classified: Secondary | ICD-10-CM

## 2023-02-12 DIAGNOSIS — Z79899 Other long term (current) drug therapy: Secondary | ICD-10-CM | POA: Diagnosis not present

## 2023-02-12 DIAGNOSIS — Z87891 Personal history of nicotine dependence: Secondary | ICD-10-CM | POA: Diagnosis not present

## 2023-02-12 DIAGNOSIS — C801 Malignant (primary) neoplasm, unspecified: Secondary | ICD-10-CM

## 2023-02-12 DIAGNOSIS — Z95828 Presence of other vascular implants and grafts: Secondary | ICD-10-CM | POA: Diagnosis not present

## 2023-02-12 DIAGNOSIS — R634 Abnormal weight loss: Secondary | ICD-10-CM

## 2023-02-12 DIAGNOSIS — Z7982 Long term (current) use of aspirin: Secondary | ICD-10-CM | POA: Diagnosis not present

## 2023-02-12 DIAGNOSIS — Z7952 Long term (current) use of systemic steroids: Secondary | ICD-10-CM | POA: Diagnosis not present

## 2023-02-12 DIAGNOSIS — F419 Anxiety disorder, unspecified: Secondary | ICD-10-CM | POA: Diagnosis not present

## 2023-02-12 DIAGNOSIS — C3411 Malignant neoplasm of upper lobe, right bronchus or lung: Secondary | ICD-10-CM | POA: Diagnosis present

## 2023-02-12 LAB — CBC WITH DIFFERENTIAL (CANCER CENTER ONLY)
Abs Immature Granulocytes: 0.03 10*3/uL (ref 0.00–0.07)
Basophils Absolute: 0 10*3/uL (ref 0.0–0.1)
Basophils Relative: 0 %
Eosinophils Absolute: 0 10*3/uL (ref 0.0–0.5)
Eosinophils Relative: 0 %
HCT: 41.3 % (ref 39.0–52.0)
Hemoglobin: 13.2 g/dL (ref 13.0–17.0)
Immature Granulocytes: 1 %
Lymphocytes Relative: 12 %
Lymphs Abs: 0.8 10*3/uL (ref 0.7–4.0)
MCH: 27.7 pg (ref 26.0–34.0)
MCHC: 32 g/dL (ref 30.0–36.0)
MCV: 86.6 fL (ref 80.0–100.0)
Monocytes Absolute: 0.6 10*3/uL (ref 0.1–1.0)
Monocytes Relative: 9 %
Neutro Abs: 5.1 10*3/uL (ref 1.7–7.7)
Neutrophils Relative %: 78 %
Platelet Count: 350 10*3/uL (ref 150–400)
RBC: 4.77 MIL/uL (ref 4.22–5.81)
RDW: 15.6 % — ABNORMAL HIGH (ref 11.5–15.5)
WBC Count: 6.5 10*3/uL (ref 4.0–10.5)
nRBC: 0 % (ref 0.0–0.2)

## 2023-02-12 LAB — CMP (CANCER CENTER ONLY)
ALT: 17 U/L (ref 0–44)
AST: 21 U/L (ref 15–41)
Albumin: 3.2 g/dL — ABNORMAL LOW (ref 3.5–5.0)
Alkaline Phosphatase: 62 U/L (ref 38–126)
Anion gap: 7 (ref 5–15)
BUN: 13 mg/dL (ref 8–23)
CO2: 29 mmol/L (ref 22–32)
Calcium: 9.1 mg/dL (ref 8.9–10.3)
Chloride: 100 mmol/L (ref 98–111)
Creatinine: 0.91 mg/dL (ref 0.61–1.24)
GFR, Estimated: >60 mL/min (ref >60)
Glucose, Bld: 84 mg/dL (ref 70–99)
Potassium: 4.6 mmol/L (ref 3.5–5.1)
Sodium: 136 mmol/L (ref 135–145)
Total Bilirubin: 0.3 mg/dL (ref 0.3–1.2)
Total Protein: 7 g/dL (ref 6.5–8.1)

## 2023-02-12 LAB — PROTEIN, URINE, RANDOM: Total Protein, Urine: 12 mg/dL

## 2023-02-12 LAB — PSA: Prostatic Specific Antigen: 17.66 ng/mL — ABNORMAL HIGH (ref 0.00–4.00)

## 2023-02-12 MED ORDER — SODIUM CHLORIDE 0.9 % IV SOLN
150.0000 mg/m2 | Freq: Once | INTRAVENOUS | Status: AC
  Administered 2023-02-12: 318 mg via INTRAVENOUS
  Filled 2023-02-12: qty 53

## 2023-02-12 MED ORDER — SODIUM CHLORIDE 0.9 % IV SOLN
10.0000 mg | Freq: Once | INTRAVENOUS | Status: AC
  Administered 2023-02-12: 10 mg via INTRAVENOUS
  Filled 2023-02-12: qty 10

## 2023-02-12 MED ORDER — SODIUM CHLORIDE 0.9% FLUSH
10.0000 mL | Freq: Once | INTRAVENOUS | Status: DC
  Filled 2023-02-12: qty 10

## 2023-02-12 MED ORDER — SODIUM CHLORIDE 0.9 % IV SOLN
15.0000 mg/kg | Freq: Once | INTRAVENOUS | Status: AC
  Administered 2023-02-12: 1300 mg via INTRAVENOUS
  Filled 2023-02-12: qty 4

## 2023-02-12 MED ORDER — DIPHENHYDRAMINE HCL 50 MG/ML IJ SOLN
50.0000 mg | Freq: Once | INTRAMUSCULAR | Status: AC
  Administered 2023-02-12: 50 mg via INTRAVENOUS
  Filled 2023-02-12: qty 1

## 2023-02-12 MED ORDER — HEPARIN SOD (PORK) LOCK FLUSH 100 UNIT/ML IV SOLN
500.0000 [IU] | Freq: Once | INTRAVENOUS | Status: DC | PRN
  Filled 2023-02-12: qty 5

## 2023-02-12 MED ORDER — SODIUM CHLORIDE 0.9 % IV SOLN
585.0000 mg | Freq: Once | INTRAVENOUS | Status: AC
  Administered 2023-02-12: 590 mg via INTRAVENOUS
  Filled 2023-02-12: qty 59

## 2023-02-12 MED ORDER — SODIUM CHLORIDE 0.9 % IV SOLN
Freq: Once | INTRAVENOUS | Status: AC
  Filled 2023-02-12: qty 250

## 2023-02-12 MED ORDER — SODIUM CHLORIDE 0.9 % IV SOLN
150.0000 mg | Freq: Once | INTRAVENOUS | Status: AC
  Administered 2023-02-12: 150 mg via INTRAVENOUS
  Filled 2023-02-12: qty 150

## 2023-02-12 MED ORDER — PALONOSETRON HCL INJECTION 0.25 MG/5ML
0.2500 mg | Freq: Once | INTRAVENOUS | Status: AC
  Administered 2023-02-12: 0.25 mg via INTRAVENOUS

## 2023-02-12 MED ORDER — PROCHLORPERAZINE MALEATE 10 MG PO TABS: 10.0000 mg | ORAL_TABLET | Freq: Four times a day (QID) | ORAL | 3 refills | Status: DC | PRN

## 2023-02-12 MED ORDER — FAMOTIDINE IN NACL 20-0.9 MG/50ML-% IV SOLN
20.0000 mg | Freq: Once | INTRAVENOUS | Status: AC
  Administered 2023-02-12: 20 mg via INTRAVENOUS
  Filled 2023-02-12: qty 50

## 2023-02-12 NOTE — Patient Instructions (Signed)

## 2023-02-12 NOTE — Assessment & Plan Note (Signed)
Medi port placed, healing surgical site.  Continue monitor.  Hold off using until port site is fully healed.

## 2023-02-12 NOTE — Assessment & Plan Note (Signed)
Palliative therapeutic thoracentesis as needed.  Consider pleural catheter if refractory to treatment.

## 2023-02-12 NOTE — Assessment & Plan Note (Signed)
Treatment plan as listed above. 

## 2023-02-12 NOTE — Assessment & Plan Note (Signed)
Follow up with nutritionist  Recommend nutrition supplements.

## 2023-02-12 NOTE — Progress Notes (Signed)
Hematology/Oncology Progress note [phone] [phone]       Patient Care Team: [name], NP as PCP - General (Family Medicine) [name], RN as Registered Nurse [name], MD as Consulting Physician (Oncology) [name], MD as Referring Physician (Radiation Oncology) Wyn Quaker Marlow Baars, MD as Referring Physician (Vascular Surgery)   REASON FOR VISIT Follow up for immunotherapy treatment for stage IIIA right lung adenocarcinoma, and presumed stage I left lung cancer   ASSESSMENT & PLAN:   Cancer Staging  Primary lung adenocarcinoma, right [facility]) Staging form: Lung, AJCC 8th Edition - Clinical stage from 12/13/2017: Stage IIIA (cT3, cN1, cM0) - Signed by [name], MD on 12/13/2017 - Pathologic stage from 12/24/2022: Stage IV (pTX, pNX, pM1) - Signed by [name], MD on 12/24/2022   Primary lung adenocarcinoma, right (HCC) #History of stage IIIA cT3 N1 M0 lung adenocarcinoma, s/p concurrent chemoradiation [Aug 2019].  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  Brain MRI is negative for metastasis. Thoracentesis cytology and biopsy via bronchoscopy both confirm adenocarcinoma Recurrent stage IV lung adenocarcinoma. Recommend systemic chemotherapy plus immunotherapy. [employer] NGS/PD-L1 showed KEAP1 missense, TP53 missense, SMARCA4 frameshift, PTPRD copy loss, no EGFR, KRAS, BRAF, ALK, ROS1 RET MET ERBB mutations.  TMB 18.9 m/mb, MSS, PD-L1 <1% recommend systemic chemotherapy with carboplatin/Taxol/bevacizumab.   No actionable mutation on NGS. PDL-1<1%, TMB 18.9   Labs are reviewed and discussed with patient. Overall he tolerates chemotherapy.  Proceed with carboplatin/Taxol/bevacizumab Consider add immunotherapy in the future  Follow up with Dr. Rushie Chestnut  for palliative radiation for occluded right upper lobe, he  finishes RT on 7/24, Dr.Chrystal ordered repeat CT next week for evaluation of response.    Port-A-Cath in place Medi port placed, healing surgical site.  Continue monitor.  Hold off using until port site is fully healed.   Anxiety associated with cancer diagnosis (HCC) low-dose lorazepam 0.5 mg every 12 hours as needed for anxiety  Pleural effusion Palliative therapeutic thoracentesis as needed.  Consider pleural catheter if refractory to treatment.    Encounter for antineoplastic chemotherapy Treatment plan as listed above.   Weight loss Follow up with nutritionist  Recommend nutrition supplements.   Orders Placed This Encounter  Procedures   Protein, urine, random    Standing Status:   Future    Standing Expiration Date:   03/04/2024   CBC with Differential (Cancer Center Only)    Standing Status:   Future    Standing Expiration Date:   03/04/2024   CMP (Cancer Center only)    Standing Status:   Future    Standing Expiration Date:   03/04/2024   Protein, urine, random    Standing Status:   Future    Standing Expiration Date:   03/25/2024   CBC with Differential (Cancer Center Only)    Standing Status:   Future    Standing Expiration Date:   03/25/2024   CMP (Cancer Center only)    Standing Status:   Future    Standing Expiration Date:   03/25/2024   Protein, urine, random    Standing Status:   Future    Standing Expiration Date:   04/15/2024   CBC with Differential (Cancer Center Only)    Standing Status:   Future    Standing Expiration Date:   04/15/2024   CMP (Cancer Center only)    Standing  Status:   Future    Standing Expiration Date:   04/15/2024   Follow up  1 week lab MD  All questions were answered. The patient knows to call the clinic with any problems, questions or concerns.  [name], MD, PhD [facility] Hematology Oncology 02/12/2023      Oncology History:  KYAW ZARN is a  72 y.o.  male with presents for stage IIIA right lung  adenocarcinoma, and presumed stage I left lung cancer now with stage IV recurrent lung adenocarcinoma. Former 43 pack year smoking history.   Oncology History  Primary lung adenocarcinoma, right (HCC)  12/13/2017 Initial Diagnosis   cT3 N1 M0 right upper lobe lung adenocarcinoma  August 2019 Finished  concurrent chemoradiation.   03/17/2019 Finished 1 year of durvalumab treatment.   12/13/2017 Cancer Staging   Staging form: Lung, AJCC 8th Edition - Clinical stage from 12/13/2017: Stage IIIA (cT3, cN1, cM0) - Signed by [name], MD on 12/13/2017   12/31/2017 - 02/19/2018 Chemotherapy   Concurrent chemotherapy carboplatin and Taxol. With radiation   03/24/2018 - 03/17/2019 Chemotherapy   LUNG DURVALUMAB Q14D x 1 year     12/01/2020 Imaging   PET  1. The 1.1 cm in long axis left apical nodule has maximum SUV of 2.2. Given the progression in size over the last year, the appearance is suspicious for small/low-grade adenocarcinoma.  2. Upper normal sized AP window lymph nodes have maximum SUV just above blood pool, and are nonspecific.   Tumor board discussion 12/29/20  Slow-growing left upper lobe nodule.  CT-guided biopsy and biopsy via bronchoscopy are both of high risk due to underlying emphysema.  Consensus reached upon continue monitoring and repeat CT in 3 months.  If nodule continues to grow, he may benefit from empiric SBRT     03/13/2021 Imaging   CT without contrast showed slightly increase of left upper lobe nodule 12x59mm versus 11 x 8 mm.  Slightly increasing size.  Remains suspicious for bronchogenic neoplasm. Subtle nodularity along the posterior left upper lobe approximately 6 mm is unchanged.  Posttreatment changes distort the right hilum are unchanged.   04/20/2021 - 04/25/2021 Radiation Therapy   Patient received radiation to left upper lobe  nodule for presumed Stage I left upper lobe lung cancer.    07/18/2021 Imaging    CT chest without contrast showed stable posttreatment  changes within the right lung.  No findings to suggest recurrent tumor or metastatic disease.  Interval decrease in size of previously characterized irregular nodule in the left upper lobe.    10/18/2021 Imaging    CT chest showed a stable examination of chronic postradiation masslike fibrosis in the right lung. Persistent subsolid nodule in the superior segment of the left lower lobe 1.3 x 1.2 cm.  Nonspecific.  COPD findings, aortic atherosclerosis.  Coronary artery disease.   01/17/2022 Imaging   01/17/2022, CT chest without contrast showed subsolid area in the left lower lobe is similar to prior imaging however with increasing nodularity.  Recommend PET scan for further evaluation Perihilar parenchymal destruction and scarring from prior radiation in the right hilum extending into the right upper and lower lobe unchanged. Left upper lobe parenchymal scarring likely reflects treatment changes.  Marked pulmonary emphysema in the background.  Aortic atherosclerosis.   02/06/2022 Imaging   PET restaging 1. Focus of increasing nodularity in the subpleural left lower lobe identified as concerning on the recent CT chest shows only minimal FDG uptake on PET imaging today.  While this is reassuring and the finding may reflect nfectious/inflammatory etiology, low-grade or well differentiated neoplasm can be poorly FDG avid. Close continued follow-up recommended. 2. Stable perifissural left lower lobe sub solid nodule, also with low level FDG uptake. Continued attention on follow-up recommende    05/17/2022 Imaging   1. Stable perihilar post radiation change and volume loss in the RIGHT hemithorax. 2. Stable mild mediastinal lymphadenopathy. 3. Peripheral nodule in the LEFT lower lobe measures slightly larger but has benign morphology. 4. Stable thickening RIGHT adrenal gland. 5. No new or progressive lung cancer identified   11/16/2022 Imaging   CT chest abdomen pelvis w contrast showed 1. New moderate  right pleural effusion with diffuse, nodular,enhancing pleural thickening consistent with pleural metastatic disease. 2. Proximal obstruction of the right upper lobe segmental bronchi with complete atelectasis or consolidation of the right upper lobe, consistent with local malignant recurrence and postobstructive atelectasis of the upper lobe. 3. Interval increase in circumferential soft tissue thickening about the right hilum. Slight interval increase in size in enlarged AP window lymph nodes. 4. Interval enlargement of a subpleural mass of the dependent superior segment left lower lobe consistent with an enlarging pulmonary metastasis or metachronous primary lung malignancy.5. Emphysema. 6. Coronary artery disease. 7. Prostatomegaly.    11/20/2022 Procedure   Thoracentesis right side Pathology is positive for adenocarcinoma.  Insufficient material for ancillary medical testing.    12/03/2022 Imaging   PET scan showed 1. Hypermetabolic mass at the RIGHT hilum consistent with bronchogenic carcinoma. 2. Postobstructive collapse of the RIGHT upper lobe. 3. Uniform hypermetabolic pleural thickening in the RIGHT lung consistent with pleural metastasis. 4. Large RIGHT effusion. 5. Hypermetabolic nodule in the superior segment of the LEFT lower lobe consistent with pulmonary metastasis. Synchronous bronchogenic carcinoma versus metastatic lesion. 6. Hypermetabolic bilateral mediastinal adenopathy consistent with nodal metastasis. 7. No evidence of metastatic disease outside the thorax. 8. No skeletal metastasis.      12/17/2022 Procedure   Patient underwent biopsy via bronchoscopy for additional tissue Pathology showed  Right upper lobe lavage-positive for non-small cell carcinoma Right upper lobe ENB assisted biopsy showed adenocarcinoma, acinar and solid patterns. Right upper lobe FNA positive for non-small cell carcinoma.  [employer] NGS/PD-L1 showed KEAP1 missense, TP53 missense,  SMARCA4 frameshift, PTPRD copy loss, no EGFR, KRAS, BRAF, ALK, ROS1 RET MET ERBB mutations.  TMB 18.9 m/mb, MSS, PD-L1 <1%    12/24/2022 Cancer Staging   Staging form: Lung, AJCC 8th Edition - Pathologic stage from 12/24/2022: Stage IV (pTX, pNX, pM1) - Signed by [name], MD on 12/24/2022 Stage prefix: Initial diagnosis   01/01/2023 -  Chemotherapy   Patient is on Treatment Plan : LUNG NSCLC Carboplatin + Paclitaxel + Bevacizumab q21d       Multiple  liver nodules,  07/11/2020 MRI liver with and without contrast showed stable hypervascular lesions in the liver parenchyma without new or progressive findings.  Lesions remain most suggestive of benign etiology.  PET scan did not show evaluation activities.   INTERVAL HISTORY BEHNAM SHAWHAN is a 72 y.o. male who has above history reviewed by me presents for follow-up of stage III A non-small cell lung cancer. Appetite is fair. Weight is stable.  + SOB, better for last thoracentesis - [right side on 02/01/23] No nausea vomiting diarrhea.  Appetite is fair, lost 9 pounds since last visit.  Recent proteous mirabilis UTI, s/p a course bactrim, improved symptoms  . Review of Systems  Constitutional:  Positive for fatigue and unexpected  weight change. Negative for appetite change, chills and fever.  [name]:   Negative for hearing loss and voice change.   Eyes:  Negative for eye problems and icterus.  Respiratory:  Positive for shortness of breath. Negative for chest tightness and cough.   Cardiovascular:  Negative for chest pain and leg swelling.  Gastrointestinal:  Negative for abdominal distention and abdominal pain.  Endocrine: Negative for hot flashes.  Genitourinary:  Negative for difficulty urinating, dysuria and frequency.   Musculoskeletal:  Negative for arthralgias.  Skin:  Negative for itching and rash.  Neurological:  Negative for headaches, light-headedness and numbness.  [name]:  Negative for adenopathy. Does not bruise/bleed  easily.  Psychiatric/Behavioral:  Negative for confusion.      MEDICAL HISTORY:  Past Medical History:  Diagnosis Date   Abnormal nuclear stress test    Anxiety    a.) on BZO (lorazepam) PRN   Asthma    Avascular necrosis of femoral head (HCC)    BPH (benign prostatic hyperplasia)    Cataract    a.) s/p extraction on LEFT   Colon adenomas    COPD (chronic obstructive pulmonary disease) (HCC)    Coronary artery disease 11/11/2014   a.) MPI 11/11/2014: EF 56%, mild inf wall ischemia; b.) LHC 12/16/2014: 20% mRCA - med mgmt   DDD (degenerative disc disease), lumbar    Diverticulosis    DM type 2 (diabetes mellitus, type 2) (HCC)    Duodenitis    Dysphagia    Dyspnea    ED (erectile dysfunction)    a.) on PDE5i (sildenafil) PRN   Elevated PSA    Former smoker    Gastritis    GERD (gastroesophageal reflux disease)    [name]    Hilar mass    History of hiatal hernia    Hyperlipidemia    Hypertension    Hyperuricemia    Hypokalemia    Left ventricular diastolic dysfunction 10/04/2016   a.) TTE 10/04/2016: EF 50%, mild MR/TR/PR, mod AR, G1DD; b.) TTE 10/10/2017: EF 45%, triv PR, mild AR/MR/TR, G1DD; c.) TTE 03/30/2020: EF 50%, mild-mod AR, mild MR/TR/PR; d.) TTE 04/23/2022: EF >55%, mild LVH, mild LAE, triv TR, mild PR, mod AR/MR   Liver lesion    Obesity    Pedal edema    Peripheral polyneuropathy    Peyronie's disease 05/2022   Pleural effusion    Primary lung adenocarcinoma, right (HCC)    a.) stage IIIA (cT3, cN1, cM0) --> s/p concurrent chemoradiation 2019 --> carboplatin + pacilitaxel (12/31/2017 - 02/19/2018) followed by 1 year (03/24/2018 - 03/17/2019) adjuvant anti-PDL-1 mAB immunotherapy (durvalumab)   [name] (peptic ulcer disease)    Renal insufficiency    Umbilical hernia    [name]     SURGICAL HISTORY: Past Surgical History:  Procedure Laterality Date   CARDIAC CATHETERIZATION N/A 12/16/2014   Procedure: Left Heart  Cath;  Surgeon: Marcina Millard, MD;  Location: [facility];  Service: Cardiovascular;  Laterality: N/A;   CARDIOVERSION N/A 01/17/2023   Procedure: CARDIOVERSION;  Surgeon: Clotilde Dieter, DO;  Location: [name];  Service: Cardiovascular;  Laterality: N/A;   CATARACT EXTRACTION Left    COLONOSCOPY     COLONOSCOPY WITH PROPOFOL N/A 01/14/2020   Procedure: COLONOSCOPY WITH PROPOFOL;  Surgeon: Toledo, Boykin Nearing, MD;  Location: [facility] ENDOSCOPY;  Service: Gastroenterology;  Laterality: N/A;   CYST REMOVAL TRUNK     chest and back over time and it was removed   CYSTECTOMY  ENDOBRONCHIAL ULTRASOUND N/A 12/09/2017   Procedure: ENDOBRONCHIAL ULTRASOUND;  Surgeon: Shane Crutch, MD;  Location: [name];  Service: Pulmonary;  Laterality: N/A;   PORTA CATH INSERTION N/A 12/23/2017   Procedure: PORTA CATH INSERTION;  Surgeon: Annice Needy, MD;  Location: [facility];  Service: Cardiovascular;  Laterality: N/A;   PORTA CATH INSERTION N/A 01/08/2023   Procedure: PORTA CATH INSERTION;  Surgeon: Annice Needy, MD;  Location: [facility];  Service: Cardiovascular;  Laterality: N/A;   PORTA CATH REMOVAL N/A 06/04/2022   Procedure: PORTA CATH REMOVAL;  Surgeon: Annice Needy, MD;  Location: [facility];  Service: Cardiovascular;  Laterality: N/A;   TEE WITHOUT CARDIOVERSION N/A 01/17/2023   Procedure: TRANSESOPHAGEAL ECHOCARDIOGRAM (TEE);  Surgeon: Clotilde Dieter, DO;  Location: [name];  Service: Cardiovascular;  Laterality: N/A;   UPPER GI ENDOSCOPY     VIDEO BRONCHOSCOPY WITH ENDOBRONCHIAL ULTRASOUND N/A 12/17/2022   Procedure: VIDEO BRONCHOSCOPY WITH ENDOBRONCHIAL ULTRASOUND;  Surgeon: Vida Rigger, MD;  Location: [name];  Service: Thoracic;  Laterality: N/A;    SOCIAL HISTORY: Social History   Socioeconomic History   Marital status: Divorced    Spouse name: Not on file   Number of children: Not on file   Years of education: Not on file    Highest education level: Not on file  Occupational History   Not on file  Tobacco Use   Smoking status: Former    Current packs/day: 0.00    Average packs/day: 1.5 packs/day for 35.0 years (52.5 ttl pk-yrs)    Types: Cigarettes    Start date: 06/21/1980    Quit date: 06/22/2015    Years since quitting: 7.6    Passive exposure: Past   Smokeless tobacco: Never  Vaping Use   Vaping status: Never Used  Substance and Sexual Activity   Alcohol use: Not Currently    Comment: weekends   Drug use: No   Sexual activity: Not Currently  Other Topics Concern   Not on file  Social History Narrative   Lives with son [name].   Social Determinants of Health   Financial Resource Strain: Not on file  Food Insecurity: Not on file  Transportation Needs: Not on file  Physical Activity: Not on file  Stress: Not on file  Social Connections: Unknown (11/14/2021)   [facility]   Social Network    Social Network: Not on file  Intimate Partner Violence: Unknown (10/05/2021)   Received from [facility]   HITS    Physically Hurt: Not on file    Insult or Talk Down To: Not on file    Threaten Physical Harm: Not on file    Scream or Curse: Not on file    FAMILY HISTORY: Family History  Problem Relation Age of Onset   Hypertension Mother    Breast cancer Mother    Heart attack Mother    Lung cancer Father    Heart disease Sister    Diabetes Sister    Colon cancer Sister     ALLERGIES:  is allergic to penicillins.  MEDICATIONS:  Current Outpatient Medications  Medication Sig Dispense Refill   acetaminophen (TYLENOL) 500 MG tablet Take 500 mg by mouth as needed.     albuterol (VENTOLIN HFA) 108 (90 Base) MCG/ACT inhaler Inhale 2 puffs into the lungs every 4 (four) hours as needed.     amLODipine (NORVASC) 10 MG tablet Take 10 mg by mouth every morning.  apixaban (ELIQUIS) 5 MG TABS tablet Take by mouth.     ascorbic acid (VITAMIN C) 1000 MG tablet Take 1,000 mg by  mouth daily.     aspirin 81 MG tablet Take 81 mg by mouth daily.     carvedilol (COREG) 25 MG tablet Take 25 mg by mouth 2 (two) times daily with a meal.     cetirizine (ZYRTEC) 10 MG tablet Take 1 tablet by mouth as needed.     cholecalciferol (VITAMIN D3) 25 MCG (1000 UNIT) tablet Take 1,000 Units by mouth daily.     dexamethasone (DECADRON) 4 MG tablet Take 2 tablets (8mg ) by mouth daily starting the day after carboplatin for 3 days. Take with food 30 tablet 1   Fluticasone-Umeclidin-Vilant (TRELEGY ELLIPTA) 200-62.5-25 MCG/ACT AEPB Inhale 1 puff into the lungs every morning.     gabapentin (NEURONTIN) 300 MG capsule Take 300 mg by mouth at bedtime as needed.      ibuprofen (ADVIL,MOTRIN) 600 MG tablet Take 600 mg by mouth every 6 (six) hours as needed.     ipratropium-albuterol (DUONEB) 0.5-2.5 (3) MG/3ML SOLN Take 3 mLs by nebulization every 6 (six) hours as needed.     lidocaine-prilocaine (EMLA) cream Apply to affected area once 30 g 3   lisinopril (PRINIVIL,ZESTRIL) 40 MG tablet Take 40 mg by mouth every morning.     LORazepam (ATIVAN) 0.5 MG tablet Take 1 tablet (0.5 mg total) by mouth every 12 (twelve) hours as needed for anxiety. 30 tablet 0   mupirocin ointment (BACTROBAN) 2 % Apply 1 Application topically daily. 22 g 3   ondansetron (ZOFRAN) 8 MG tablet Take 1 tablet (8 mg total) by mouth every 8 (eight) hours as needed for nausea or vomiting. Start on the third day after carboplatin. 30 tablet 1   potassium chloride (KLOR-CON) 10 MEQ tablet Take 1 tablet (10 mEq total) by mouth daily. (Patient taking differently: Take 10 mEq by mouth every morning.) 90 tablet 1   pravastatin (PRAVACHOL) 20 MG tablet Take 20 mg by mouth at bedtime.     senna-docusate (SENOKOT-S) 8.6-50 MG tablet Take 2 tablets by mouth daily. 60 tablet 2   spironolactone (ALDACTONE) 25 MG tablet Take by mouth.     tamsulosin (FLOMAX) 0.4 MG CAPS capsule Take 0.4 mg by mouth daily after breakfast.     torsemide  (DEMADEX) 20 MG tablet Take 20 mg by mouth every morning.     Vibegron (GEMTESA) 75 MG TABS Take 1 tablet (75 mg total) by mouth daily. (Patient taking differently: Take 1 tablet by mouth every morning.) 30 tablet 0   phenazopyridine (PYRIDIUM) 200 MG tablet Take 1 tablet (200 mg total) by mouth 3 (three) times daily. (Patient not taking: Reported on 02/12/2023) 6 tablet 0   prochlorperazine (COMPAZINE) 10 MG tablet Take 1 tablet (10 mg total) by mouth every 6 (six) hours as needed for nausea or vomiting. 90 tablet 3   sildenafil (VIAGRA) 50 MG tablet Take 1-2 tablets (50-100 mg total) by mouth daily as needed for erectile dysfunction. (Patient not taking: Reported on 01/17/2023) 30 tablet 6   sulfamethoxazole-trimethoprim (BACTRIM DS) 800-160 MG tablet Take 1 tablet by mouth 2 (two) times daily for 7 days. (Patient not taking: Reported on 02/12/2023) 14 tablet 0   No current facility-administered medications for this visit.   Facility-Administered Medications Ordered in Other Visits  Medication Dose Route Frequency Provider Last Rate Last Admin   bevacizumab-awwb (MVASI) 1,300 mg in sodium chloride 0.9 %  100 mL chemo infusion  15 mg/kg (Order-Specific) Intravenous Once [name], MD       CARBOplatin (PARAPLATIN) 590 mg in sodium chloride 0.9 % 250 mL chemo infusion  590 mg Intravenous Once [name], MD       dexamethasone (DECADRON) 10 mg in sodium chloride 0.9 % 50 mL IVPB  10 mg Intravenous Once [name], MD 204 mL/hr at 02/12/23 1100 10 mg at 02/12/23 1100   diphenhydrAMINE (BENADRYL) injection 50 mg  50 mg Intravenous Once [name], MD       famotidine (PEPCID) IVPB 20 mg premix  20 mg Intravenous Once [name], MD       fosaprepitant (EMEND) 150 mg in sodium chloride 0.9 % 145 mL IVPB  150 mg Intravenous Once [name], MD 450 mL/hr at 02/12/23 1059 150 mg at 02/12/23 1059   PACLitaxel (TAXOL) 318 mg in sodium chloride 0.9 % 500 mL chemo infusion (> 80mg /m2)  150 mg/m2 (Order-Specific) Intravenous  Once [name], MD       palonosetron (ALOXI) injection 0.25 mg  0.25 mg Intravenous Once [name], MD       sodium chloride flush (NS) 0.9 % injection 10 mL  10 mL Intravenous PRN [name], MD   10 mL at 03/18/18 0858   sodium chloride flush (NS) 0.9 % injection 10 mL  10 mL Intravenous Once [name], MD         PHYSICAL EXAMINATION: ECOG PERFORMANCE STATUS: 0 - Asymptomatic Vitals:   02/12/23 0921  BP: (!) 141/79  Pulse: 73  Resp: 18  Temp: (!) 96 F (35.6 C)  SpO2: 93%   Filed Weights   02/12/23 0921  Weight: 193 lb 3.2 oz (87.6 kg)    Physical Exam Constitutional:      General: He is not in acute distress. [name]:     Head: Normocephalic and atraumatic.  Eyes:     General: No scleral icterus.    Pupils: Pupils are equal, round, and reactive to light.  Cardiovascular:     Rate and Rhythm: Normal rate and regular rhythm.     Heart sounds: Normal heart sounds.  Pulmonary:     Effort: Pulmonary effort is normal. No respiratory distress.     Comments: Decreased breath sound bilaterally Abdominal:     General: Bowel sounds are normal. There is no distension.     Palpations: Abdomen is soft.  Musculoskeletal:        General: No deformity. Normal range of motion.     Cervical back: Normal range of motion and neck supple.  Lymphadenopathy:     Cervical: No cervical adenopathy.  Skin:    General: Skin is warm and dry.     Findings: No rash.     Comments: Left supraclavicular fossa fullness, chronic. Right chest wall medi port with surrounding bruising, small area of skin opening.  no discharge or tenderness.   Neurological:     Mental Status: He is alert and oriented to person, place, and time. Mental status is at baseline.     Cranial Nerves: No cranial nerve deficit.   02/12/23    LABORATORY DATA:  I have reviewed the data as listed    Latest Ref Rng & Units 02/12/2023    8:40 AM 01/30/2023    9:19 AM 01/22/2023   10:21 AM  CBC  WBC 4.0 - 10.5 K/uL 6.5  2.6   10.8   Hemoglobin 13.0 - 17.0 g/dL 09.8  13.4  14.7   Hematocrit 39.0 - 52.0 % 41.3  39.7  46.0   Platelets 150 - 400 K/uL 350  70  144       Latest Ref Rng & Units 02/12/2023    8:40 AM 01/22/2023   10:21 AM 01/16/2023   10:16 AM  CMP  Glucose 70 - 99 mg/dL 84  161  096   BUN 8 - 23 mg/dL 13  13  19    Creatinine 0.61 - 1.24 mg/dL 0.45  4.09  8.11   Sodium 135 - 145 mmol/L 136  138  135   Potassium 3.5 - 5.1 mmol/L 4.6  4.3  4.1   Chloride 98 - 111 mmol/L 100  100  96   CO2 22 - 32 mmol/L 29  30  31    Calcium 8.9 - 10.3 mg/dL 9.1  8.6  8.5   Total Protein 6.5 - 8.1 g/dL 7.0  6.2  6.3   Total Bilirubin 0.3 - 1.2 mg/dL 0.3  0.4  0.2   Alkaline Phos 38 - 126 U/L 62  58  58   AST 15 - 41 U/L 21  17  21    ALT 0 - 44 U/L 17  24  31       RADIOGRAPHIC STUDIES: I have personally reviewed the radiological images as listed and agreed with the findings in the report.[name] US THORACENTESIS ASP PLEURAL SPACE W/IMG GUIDE  Result Date: 02/01/2023 INDICATION: Recurrent right malignant pleural effusion EXAM: ULTRASOUND GUIDED RIGHT THORACENTESIS MEDICATIONS: 6 cc 1% lidocaine COMPLICATIONS: None immediate. PROCEDURE: An ultrasound guided thoracentesis was thoroughly discussed with the patient and questions answered. The benefits, risks, alternatives and complications were also discussed. The patient understands and wishes to proceed with the procedure. Written consent was obtained. Ultrasound was performed to localize and mark an adequate pocket of fluid in the right chest. The area was then prepped and draped in the normal sterile fashion. 1% Lidocaine was used for local anesthesia. Under ultrasound guidance a 6 Fr Safe-T-Centesis catheter was introduced. Thoracentesis was performed. The catheter was removed and a dressing applied. FINDINGS: A total of approximately 2.4 L of serosanguineous fluid was removed. Ordering provider did not request laboratory samples. IMPRESSION: Successful ultrasound guided right  thoracentesis yielding 2.4 L of pleural fluid. Follow-up chest x-ray revealed no evidence of pneumothorax. Procedure performed by Mina Marble, PA-C Electronically Signed   By: Olive Bass M.D.   On: 02/01/2023 14:26   DG Chest Port 1 View  Result Date: 02/01/2023 CLINICAL DATA:  Status post right thoracentesis. EXAM: PORTABLE CHEST 1 VIEW COMPARISON:  January 22, 2023. FINDINGS: Stable cardiomediastinal silhouette. Left lung is clear. Right internal jugular Port-A-Cath is unchanged. No pneumothorax is noted. Right pleural effusion is significantly smaller. IMPRESSION: No pneumothorax status post right-sided thoracentesis. Right pleural effusion is significantly smaller. Electronically Signed   By: Lupita Raider M.D.   On: 02/01/2023 14:13   DG Chest 2 View  Result Date: 01/29/2023 CLINICAL DATA:  Pleural effusion.  History of lung adenocarcinoma. EXAM: CHEST - 2 VIEW COMPARISON:  01/10/2023.  CT, 12/14/2022. FINDINGS: Opacity extends from the right mid hemithorax to the base obscuring the right hemidiaphragm and right heart border, increased compared to the prior exam, consistent with a large pleural effusion and associated atelectasis. Pleural thickening or loculated fluid extends over the right lung apex similar to the prior exam. Left lung is hyperexpanded. Focal nodular opacity in the left mid lung appears smaller than on the prior radiographs. Relative  lucency in the upper lobe is consistent with emphysema. There prominent bronchovascular and interstitial markings mostly in the lower lungs. Linear scarring noted at the left apex. These findings are stable. No left pleural effusion. No pneumothorax. Right anterior chest wall, internal jugular Port-A-Cath is stable. Cardiac silhouette is partly obscured, grossly normal in size. No convincing mediastinal or hilar mass or adenopathy. Skeletal structures intact. IMPRESSION: 1. Interval enlargement the right pleural effusion, now large, associated  compressive atelectasis. 2. No evidence of pneumonia or pulmonary edema. 3. Nodular opacity in the left mid lung, corresponding to the superior segment of the left lower lobe based on the prior CT, appears smaller than on the recent prior chest radiograph. No new nodules and no other change in the appearance of the left lung. Electronically Signed   By: Amie Portland M.D.   On: 01/29/2023 10:25   ECHO TEE  Result Date: 01/17/2023    TRANSESOPHOGEAL ECHO REPORT   Patient Name:   MUKESH WUNSCHEL Date of Exam: 01/17/2023 Medical Rec #:  [id number]         Height:       72.0 in Accession #:    [id number]        Weight:       207.9 lb Date of Birth:  12-25-1950         BSA:          2.166 m Patient Age:    72 years          BP:           122/82 mmHg Patient Gender: M                 HR:           77 bpm. Exam Location:  [facility] Procedure: Transesophageal Echo, Cardiac Doppler and Color Doppler Indications:     Not listed on TEE check-in sheet  History:         Patient has no prior history of Echocardiogram examinations.  Sonographer:     Cristela Blue Referring Phys:  [id number] [facility] CUSTOVIC Diagnosing Phys: Rozell Searing Custovic PROCEDURE: The transesophogeal probe was passed without difficulty through the esophogus of the patient. Sedation performed by different physician. The patient developed no complications during the procedure.  IMPRESSIONS  1. Left ventricular ejection fraction, by estimation, is 55 to 60%. The left ventricle has normal function.  2. Right ventricular systolic function was not well visualized. The right ventricular size is not well visualized.  3. No left atrial/left atrial appendage thrombus was detected.  4. The mitral valve was not assessed. No evidence of mitral valve regurgitation.  5. The aortic valve was not assessed. Aortic valve regurgitation is not visualized. Conclusion(s)/Recommendation(s): Normal biventricular function without evidence of hemodynamically significant valvular heart disease.  No LA/LAA thrombus identified. Successful cardioversion performed with restoration of normal sinus rhythm. FINDINGS  Left Ventricle: Left ventricular ejection fraction, by estimation, is 55 to 60%. The left ventricle has normal function. The left ventricular internal cavity size was normal in size. Right Ventricle: The right ventricular size is not well visualized. Right vetricular wall thickness was not assessed. Right ventricular systolic function was not well visualized. Left Atrium: Left atrial size was normal in size. No left atrial/left atrial appendage thrombus was detected. Right Atrium: Right atrial size was not assessed. Pericardium: The pericardium was not assessed. Mitral Valve: The mitral valve was not assessed. No evidence of mitral valve regurgitation. Tricuspid Valve: The tricuspid valve is not  assessed. Tricuspid valve regurgitation is not demonstrated. Aortic Valve: The aortic valve was not assessed. Aortic valve regurgitation is not visualized. Pulmonic Valve: The pulmonic valve was not assessed. Pulmonic valve regurgitation is not visualized. Aorta: Aortic root could not be assessed. IAS/Shunts: The interatrial septum was not assessed. Clotilde Dieter Electronically signed by Clotilde Dieter Signature Date/Time: 01/17/2023/1:54:22 PM    Final    DG Chest Port 1 View  Result Date: 01/10/2023 CLINICAL DATA:  Right-sided thoracentesis. EXAM: PORTABLE CHEST 1 VIEW COMPARISON:  One-view chest x-ray 01/09/2023 FINDINGS: The heart is enlarged.  A right IJ Port-A-Cath is stable. The right pleural effusion has decreased. No pneumothorax is present. No significant left-sided effusion is present. A left perihilar density is again noted. IMPRESSION: 1. Interval decrease in right pleural effusion without pneumothorax. 2. Stable left perihilar density. 3. Cardiomegaly without failure. Electronically Signed   By: Marin Roberts M.D.   On: 01/10/2023 16:25   US THORACENTESIS ASP PLEURAL SPACE W/IMG  GUIDE  Result Date: 01/10/2023 INDICATION: Patient with history of right lung cancer and recurrent symptomatic pleural effusion request received for therapeutic thoracentesis. EXAM: ULTRASOUND GUIDED RIGHT THORACENTESIS MEDICATIONS: Local 1% lidocaine only. COMPLICATIONS: None immediate. PROCEDURE: An ultrasound guided thoracentesis was thoroughly discussed with the patient and questions answered. The benefits, risks, alternatives and complications were also discussed. The patient understands and wishes to proceed with the procedure. Written consent was obtained. Ultrasound was performed to localize and mark an adequate pocket of fluid in the right chest. The area was then prepped and draped in the normal sterile fashion. 1% Lidocaine was used for local anesthesia. Under ultrasound guidance a 19 gauge, 7-cm, Yueh catheter was introduced. Thoracentesis was performed. The catheter was removed and a dressing applied. FINDINGS: A total of approximately 2 L of serosanguineous fluid was removed. IMPRESSION: Successful ultrasound guided right thoracentesis yielding 2 L of pleural fluid. This exam was performed by Pattricia Boss PA-C, and was supervised and interpreted by Dr. Juliette Alcide. Electronically Signed   By: Olive Bass M.D.   On: 01/10/2023 16:13   DG Chest 2 View  Result Date: 01/09/2023 CLINICAL DATA:  Shortness of breath. Primary lung adenocarcinoma, rate EXAM: CHEST - 2 VIEW COMPARISON:  Chest radiographs 12/28/2022 and 12/25/2022; CT 12/14/2022 FINDINGS: New right chest wall porta catheter with tip overlying the superior vena cava. The mid aspect of this catheter curls approximately 2.5 cm superiorly at the right neck base before turning inferiorly and extending into the superior vena cava. Interval increase in homogeneous opacity indicating pleural effusion involving the inferior 60% of the right hemithorax compared to 12/28/2022, now more similar to 12/25/2022. There is again homogeneous opacification  from right upper lobe collapse. There is again an approximate 2-3 cm faint density overlying the left midlung corresponding to the nodule better seen on prior CT within the superior segment of the left lower lobe on prior CT. No left pleural effusion. No pneumothorax. Visualized left side of the cardiac silhouette and mediastinal contours are unremarkable. Mild multilevel degenerative disc changes of the thoracic spine. IMPRESSION: 1. New right chest wall porta catheter with tip overlying the superior vena cava. The mid aspect of this catheter curls approximately 2.5 cm superiorly at the right neck base, likely into the right internal jugular vein before turning inferiorly and extending into the superior vena cava. 2. Interval increase in homogeneous opacity indicating moderate-to-large pleural effusion involving the inferior 60% of the right hemithorax compared to 12/28/2022, now more similar to 12/25/2022. There also may  be right lower lobe collapse, as seen on 12/14/2022 CT. 3. There is again homogeneous opacification of the superomedial right hemithorax from right upper lobe collapse. 4. There is again an approximate 2-3 cm faint density overlying the left midlung corresponding to the nodule better seen on prior CT within the superior segment of the left lower lobe on prior CT. Electronically Signed   By: Neita Garnet M.D.   On: 01/09/2023 12:38   PERIPHERAL VASCULAR CATHETERIZATION  Result Date: 01/08/2023 See surgical note for result.  US THORACENTESIS ASP PLEURAL SPACE W/IMG GUIDE  Result Date: 12/28/2022 INDICATION: Right pleural effusion EXAM: ULTRASOUND GUIDED RIGHT THORACENTESIS MEDICATIONS: None. COMPLICATIONS: None immediate. PROCEDURE: An ultrasound guided thoracentesis was thoroughly discussed with the patient and questions answered. The benefits, risks, alternatives and complications were also discussed. The patient understands and wishes to proceed with the procedure. Written consent was  obtained. Ultrasound was performed to localize and mark an adequate pocket of fluid in the right chest. The area was then prepped and draped in the normal sterile fashion. 1% Lidocaine was used for local anesthesia. Under ultrasound guidance a 19 gauge, 7-cm, Yueh catheter was introduced. Thoracentesis was performed. The catheter was removed and a dressing applied. FINDINGS: A total of approximately 2.0 L of thin serosanguineous fluid was removed. IMPRESSION: Successful ultrasound guided right thoracentesis yielding 2.0 L of serosanguineous pleural fluid. Electronically Signed   By: Olive Bass M.D.   On: 12/28/2022 15:30   DG Chest 2 View  Result Date: 12/28/2022 CLINICAL DATA:  Right pleural effusion EXAM: CHEST - 2 VIEW COMPARISON:  Previous studies including the examination of 12/18/2022 FINDINGS: There is a interval increase in size of large right pleural effusion. There is opacification of right mid and right lower lung fields. Homogeneous density in the medial right upper lung field corresponds to her case is seen right upper lobe. There is faint 2.1 cm nodular density in left parahilar region corresponding to the lung nodule in left lower lobe seen in previous CT. Scarring is seen in the left apex. IMPRESSION: There is large right pleural effusion with interval increase. Homogeneous opacity in the medial right upper lung field corresponds to atelectasis seen in previous CT. There is 2.1 cm nodular density in the left mid lung field corresponding to a nodular density in left lower lobe seen in previous CT. Electronically Signed   By: Ernie Avena M.D.   On: 12/28/2022 14:49   DG Chest Port 1 View  Result Date: 12/28/2022 CLINICAL DATA:  Right pleural effusion, status post thoracentesis EXAM: PORTABLE CHEST 1 VIEW COMPARISON:  Previous studies including the examination of 12/25/2022 FINDINGS: Transverse diameter of heart is increased. There is homogeneous opacity in the medial right upper  lung field consistent with previously described atelectasis in right upper lobe. There is decreasing large right pleural effusion. There is no pneumothorax. Left lung is clear. Evaluation of right lower lung field is limited by the effusion. IMPRESSION: There is decrease in amount of right pleural effusion after thoracentesis. There is no pneumothorax. Electronically Signed   By: Ernie Avena M.D.   On: 12/28/2022 14:44   DG Chest Port 1 View  Result Date: 12/18/2022 CLINICAL DATA:  Pleural effusion. EXAM: PORTABLE CHEST 1 VIEW COMPARISON:  12/17/2022 FINDINGS: Stable asymmetric elevation right hemidiaphragm. Interval decrease in right pleural effusion. Bibasilar atelectasis again noted. Cardiopericardial silhouette is at upper limits of normal for size. No acute bony abnormality. Soft tissue fullness in the medial right apex is similar  to prior, compatible with right upper lobe collapse. IMPRESSION: 1. Interval decrease in right pleural effusion. 2. Persistent right upper lobe collapse. Electronically Signed   By: Kennith Center M.D.   On: 12/18/2022 12:20   US THORACENTESIS ASP PLEURAL SPACE W/IMG GUIDE  Result Date: 12/17/2022 INDICATION: Patient with right lung cancer with recurrent pleural effusion. Interventional radiology asked to perform a therapeutic thoracentesis. EXAM: ULTRASOUND GUIDED THORACENTESIS MEDICATIONS: 1% lidocaine 10 mL COMPLICATIONS: None immediate. PROCEDURE: An ultrasound guided thoracentesis was thoroughly discussed with the patient and questions answered. The benefits, risks, alternatives and complications were also discussed. The patient understands and wishes to proceed with the procedure. Written consent was obtained. Ultrasound was performed to localize and mark an adequate pocket of fluid in the right chest. The area was then prepped and draped in the normal sterile fashion. 1% Lidocaine was used for local anesthesia. Under ultrasound guidance a 6 Fr Safe-T-Centesis  catheter was introduced. Thoracentesis was performed. The catheter was removed and a dressing applied. FINDINGS: A total of approximately 1.5 L of amber colored fluid was removed. IMPRESSION: Successful ultrasound guided right thoracentesis yielding 1.5 L of pleural fluid. Procedure performed by Alwyn Ren NP and supervised by Dr. Archer Asa. Electronically Signed   By: Malachy Moan M.D.   On: 12/17/2022 20:15   CT SUPER D CHEST WO MONARCH PILOT  Result Date: 12/17/2022 CLINICAL DATA:  Non-small cell lung cancer, ongoing immunotherapy, pleural metastatic disease * Tracking Code: BO * EXAM: CT CHEST WITHOUT CONTRAST TECHNIQUE: Multidetector CT imaging of the chest was performed using thin slice collimation for electromagnetic bronchoscopy planning purposes, without intravenous contrast. RADIATION DOSE REDUCTION: This exam was performed according to the departmental dose-optimization program which includes automated exposure control, adjustment of the mA and/or kV according to patient size and/or use of iterative reconstruction technique. COMPARISON:  Multiple exams, including PET-CT 12/03/2022 FINDINGS: Cardiovascular: Coronary, aortic arch, and branch vessel atherosclerotic vascular disease. Trace pericardial effusion. Mediastinum/Nodes: Mediastinal adenopathy was recently hypermetabolic with representative nodes including a 1.1 cm AP window lymph node on image 58 of series 2 and a 1.6 cm subcarinal lymph node on image 72 series 2. Lungs/Pleura: Very large right pleural effusion with only a small amount of aerated right middle lobe and complete atelectasis of the right lower lobe and right upper lobe. Extensive rind like density along the pleural is irregular and compatible with pleural metastatic disease. There is also nodular infiltration of the pleural adipose tissue at the right anterior lung base adjacent to the pericardium compatible with tumor infiltration. The patient has a known right hilar  and infrahilar mass which is obscured by the surrounding atelectasis. Severe emphysema. A pleural-based nodule in the left lower lobe superior segment measures 2.2 by 2.4 cm on image 79 of series 4 and was hypermetabolic on the 12/03/2022 PET-CT compatible with a contralateral metastatic lesion. Upper Abdomen: No adrenal mass. Scattered colonic diverticula. Abdominal aortic atherosclerosis. Musculoskeletal: Probable hemangioma at the L2 vertebral level. Not previously hypermetabolic. IMPRESSION: 1. Very large right pleural effusion with only a small amount of aerated right middle lobe and complete atelectasis of the right lower lobe and right upper lobe. 2. Extensive rind like density along the pleural is irregular and compatible with pleural metastatic disease. There is also nodular infiltration of the pleural adipose tissue at the right anterior lung base adjacent to the pericardium compatible with tumor infiltration. 3. The patient has a known right hilar and infrahilar mass which is obscured by the surrounding atelectasis. 4.  Mediastinal adenopathy was recently hypermetabolic on the 12/03/2022 PET-CT compatible with metastatic disease. 5. A pleural-based nodule in the left lower lobe superior segment measures 2.2 by 2.4 cm and was hypermetabolic on the 12/03/2022 PET-CT compatible with a contralateral metastatic lesion. 6. Severe emphysema. 7. Coronary, aortic arch, and branch vessel atherosclerotic vascular disease. 8. Trace pericardial effusion. 9. Probable hemangioma at the L2 vertebral level. Aortic Atherosclerosis (ICD10-I70.0) and Emphysema (ICD10-J43.9). Electronically Signed   By: Gaylyn Rong M.D.   On: 12/17/2022 18:06   DG Chest Port 1 View  Result Date: 12/17/2022 CLINICAL DATA:  Status post bronchoscopy EXAM: PORTABLE CHEST 1 VIEW COMPARISON:  11/20/2022 FINDINGS: There is only a miniscule amount of aerated lung in the right mid upper chest, otherwise the right hemithorax is completely  opacified. Indistinct pulmonary vasculature on the left possibly from pulmonary venous hypertension. No blunting of the left costophrenic angle. No visible pneumothorax. IMPRESSION: 1. Near complete opacification of the right hemithorax, with only a miniscule amount of aerated lung in the right mid upper chest. 2. Indistinct pulmonary vasculature on the left possibly from pulmonary venous hypertension. Electronically Signed   By: Gaylyn Rong M.D.   On: 12/17/2022 17:56   DG Chest Port 1 View  Result Date: 12/17/2022 CLINICAL DATA:  Status post thoracentesis EXAM: PORTABLE CHEST 1 VIEW COMPARISON:  Chest x-ray dated June 17th 2020 FINDINGS: Visualized cardiac and mediastinal contours are unchanged. Moderate right pleural effusion, decreased in size when compared with the prior exam. Lung predominant heterogeneous opacities, likely atelectasis. Persistent right upper lobe collapse. No evidence of pneumothorax. IMPRESSION: 1. Moderate right pleural effusion, decreased in size. 2. No evidence of pneumothorax. 3. Persistent right upper lobe collapse. Electronically Signed   By: Allegra Lai M.D.   On: 12/17/2022 16:42   MR Brain W Wo Contrast  Result Date: 12/16/2022 CLINICAL DATA:  Lung cancer, evaluate for metastatic disease EXAM: MRI HEAD WITHOUT AND WITH CONTRAST TECHNIQUE: Multiplanar, multiecho pulse sequences of the brain and surrounding structures were obtained without and with intravenous contrast. CONTRAST:  10mL GADAVIST GADOBUTROL 1 MMOL/ML IV SOLN COMPARISON:  08/25/2019 FINDINGS: Brain: No restricted diffusion to suggest acute or subacute infarct. No abnormal parenchymal or meningeal enhancement. No acute hemorrhage, mass, mass effect, or midline shift. No hydrocephalus or extra-axial collection. Partial empty sella. Craniocervical junction. No hemosiderin deposition to suggest remote hemorrhage. Normal cerebral volume for age. Scattered T2 hyperintense signal in the periventricular white  matter, likely the sequela of mild chronic small vessel ischemic disease. Vascular: Normal arterial flow voids. Skull and upper cervical spine: Normal marrow signal. Sinuses/Orbits: Clear paranasal sinuses. No acute finding in the orbits. Status post left lens replacement. Other: The mastoid air cells are well aerated. IMPRESSION: No acute intracranial process. No evidence of metastatic disease in the brain. Electronically Signed   By: Wiliam Ke M.D.   On: 12/16/2022 03:30   NM PET Image Restag (PS) Skull Base To Thigh  Result Date: 12/03/2022 CLINICAL DATA:  Subsequent treatment strategy for non-small cell lung cancer. EXAM: NUCLEAR MEDICINE PET SKULL BASE TO THIGH TECHNIQUE: 12.1 mCi F-18 FDG was injected intravenously. Full-ring PET imaging was performed from the skull base to thigh after the radiotracer. CT data was obtained and used for attenuation correction and anatomic localization. Fasting blood glucose: 101 mg/dl COMPARISON:  PET-CT 09/81/1914 CT 11/16/2022 FINDINGS: Mediastinal blood pool activity: SUV max 2.1 Liver activity: SUV max NA NECK: No hypermetabolic lymph nodes in the neck. Incidental CT findings: None. CHEST: Rim  intense hypermetabolic activity within the RIGHT hemithorax pleural space. This thin rim of metabolic 2 borders in large RIGHT pleural effusion. More nodular pleural thickening in the medial aspect of the RIGHT middle lobe with SUV max equal 8.5. Nodular thickening measuring up to 9 mm (image 70/4). Hypermetabolic thickening at the RIGHT hilum measures 3.5 cm surrounds the RIGHT upper lobe bronchi. Activity is intense with SUV max equal 8.5. There is postobstructive collapse of the RIGHT upper lobe. Large RIGHT effusion. Within the superior segment LEFT lower lobe 22 mm hypermetabolic nodule with SUV max equal 9.5. There is small bilateral hypermetabolic mediastinal adenopathy and subcarinal adenopathy. Incidental CT findings: None. ABDOMEN/PELVIS: Limited view of the liver,  kidneys, pancreas are unremarkable. Normal adrenal glands. Incidental CT findings: Adrenal glands normal. No metastatic adenopathy. No liver metastasis. SKELETON: No focal hypermetabolic activity to suggest skeletal metastasis. Several sclerotic lesions in the pelvis and spine without metabolic activity. Incidental CT findings: None. IMPRESSION: 1. Hypermetabolic mass at the RIGHT hilum consistent with bronchogenic carcinoma. 2. Postobstructive collapse of the RIGHT upper lobe. 3. Uniform hypermetabolic pleural thickening in the RIGHT lung consistent with pleural metastasis. 4. Large RIGHT effusion. 5. Hypermetabolic nodule in the superior segment of the LEFT lower lobe consistent with pulmonary metastasis. Synchronous bronchogenic carcinoma versus metastatic lesion. 6. Hypermetabolic bilateral mediastinal adenopathy consistent with nodal metastasis. 7. No evidence of metastatic disease outside the thorax. 8. No skeletal metastasis. Electronically Signed   By: Genevive Bi M.D.   On: 12/03/2022 16:33   US THORACENTESIS ASP PLEURAL SPACE W/IMG GUIDE  Result Date: 11/20/2022 INDICATION: Right pleural effusion EXAM: ULTRASOUND GUIDED RIGHT THORACENTESIS MEDICATIONS: 7 cc 1% lidocaine COMPLICATIONS: None immediate. PROCEDURE: An ultrasound guided thoracentesis was thoroughly discussed with the patient and questions answered. The benefits, risks, alternatives and complications were also discussed. The patient understands and wishes to proceed with the procedure. Written consent was obtained. Ultrasound was performed to localize and mark an adequate pocket of fluid in the right chest. The area was then prepped and draped in the normal sterile fashion. 1% Lidocaine was used for local anesthesia. Under ultrasound guidance a catheter was introduced. Thoracentesis was performed. The catheter was removed and a dressing applied. FINDINGS: A total of approximately 1 L of hazy yellow fluid was removed. Samples were sent  to the laboratory as requested by the clinical team. IMPRESSION: Successful ultrasound guided right thoracentesis yielding 1 L of pleural fluid. Follow-up chest x-ray revealed no evidence of pneumothorax. Procedure performed by Mina Marble, PA-C Electronically Signed   By: Olive Bass M.D.   On: 11/20/2022 15:47   DG Chest Port 1 View  Result Date: 11/20/2022 CLINICAL DATA:  s/p thoracentesis, right EXAM: PORTABLE CHEST 1 VIEW COMPARISON:  Multiple prior CTs FINDINGS: There is right upper lobe collapse. The heart appears within normal limits. No significant pleural effusion appreciated status post right thoracentesis. No pneumothorax. The left lung is clear. No acute osseous abnormality. IMPRESSION: 1. No pneumothorax status post right thoracentesis. No significant residual pleural effusion. 2. Known right upper lobe collapse. Electronically Signed   By: Olive Bass M.D.   On: 11/20/2022 14:57   CT CHEST ABDOMEN PELVIS W CONTRAST  Result Date: 11/16/2022 CLINICAL DATA:  Lung cancer restaging, ongoing immunotherapy * Tracking Code: BO * EXAM: CT CHEST, ABDOMEN, AND PELVIS WITH CONTRAST TECHNIQUE: Multidetector CT imaging of the chest, abdomen and pelvis was performed following the standard protocol during bolus administration of intravenous contrast. RADIATION DOSE REDUCTION: This exam was performed according  to the departmental dose-optimization program which includes automated exposure control, adjustment of the mA and/or kV according to patient size and/or use of iterative reconstruction technique. CONTRAST:  OMNIPAQUE IOHEXOL 300 MG/ML  SOLN COMPARISON:  CT chest, 05/16/2022, PET-CT, 02/05/2022 FINDINGS: CT CHEST FINDINGS Cardiovascular: Aortic atherosclerosis. Normal heart size. Scattered left coronary artery calcifications. No pericardial effusion. Mediastinum/Nodes: Interval increase in circumferential soft tissue thickening about the right hilum (series 2, image 24). Slight interval  increase in size in enlarged AP window lymph nodes, measuring up to 2.3 x 1.1 cm, previously 1.7 x 0.9 cm (series 2, image 42). Thyroid gland, trachea, and esophagus demonstrate no significant findings. Lungs/Pleura: New moderate right pleural effusion with diffuse, nodular, enhancing pleural thickening (series 2, image 29). Proximal obstruction of the right upper lobe segmental bronchi with complete atelectasis or consolidation of the right upper lobe, new compared to prior examination (series 2, image 19). Interval enlargement of a subpleural mass of the dependent superior segment left lower lobe, measuring 2.4 x 2.1 cm, previously 1.8 x 1.2 cm (series 3, image 68). Severe underlying emphysema. Musculoskeletal: No chest wall abnormality. No acute osseous findings. CT ABDOMEN PELVIS FINDINGS Hepatobiliary: No solid liver abnormality is seen. No gallstones, gallbladder wall thickening, or biliary dilatation. Pancreas: Unremarkable. No pancreatic ductal dilatation or surrounding inflammatory changes. Spleen: Normal in size without significant abnormality. Adrenals/Urinary Tract: Adrenal glands are unremarkable. Kidneys are normal, without renal calculi, solid lesion, or hydronephrosis. Bladder is unremarkable. Stomach/Bowel: Stomach is within normal limits. Appendix appears normal. No evidence of bowel wall thickening, distention, or inflammatory changes. Pancolonic diverticulosis. Moderate burden of stool throughout the colon. Vascular/Lymphatic: Aortic atherosclerosis. No enlarged abdominal or pelvic lymph nodes. Reproductive: Prostatomegaly. Other: No abdominal wall hernia or abnormality. No ascites. Musculoskeletal: No acute osseous findings. Unchanged sclerotic, trabeculated vertebral body hemangiomata of T10 and L2 (series 6, image 100). No suspicious osseous lesions. 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 IMPRESSION: 1. New moderate right pleural effusion with diffuse, nodular,  enhancing pleural thickening consistent with pleural metastatic disease. 2. Proximal obstruction of the right upper lobe segmental bronchi with complete atelectasis or consolidation of the right upper lobe, consistent with local malignant recurrence and postobstructive atelectasis of the upper lobe. 3. Interval increase in circumferential soft tissue thickening about the right hilum. Slight interval increase in size in enlarged AP window lymph nodes. 4. Interval enlargement of a subpleural mass of the dependent superior segment left lower lobe consistent with an enlarging pulmonary metastasis or metachronous primary lung malignancy. 5. Emphysema. 6. Coronary artery disease. 7. Prostatomegaly. These results will be called to the ordering clinician or representative by the Radiologist Assistant, and communication documented in the PACS or [employer]. Aortic Atherosclerosis (ICD10-I70.0) and Emphysema (ICD10-J43.9). Electronically Signed   By: Jearld Lesch M.D.   On: 11/16/2022 17:13

## 2023-02-12 NOTE — Assessment & Plan Note (Signed)
 low-dose lorazepam 0.5 mg every 12 hours as needed for anxiety

## 2023-02-12 NOTE — Assessment & Plan Note (Addendum)
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  Brain MRI is negative for metastasis. Thoracentesis cytology and biopsy via bronchoscopy both confirm adenocarcinoma Recurrent stage IV lung adenocarcinoma. Recommend systemic chemotherapy plus immunotherapy. [employer] NGS/PD-L1 showed KEAP1 missense, TP53 missense, SMARCA4 frameshift, PTPRD copy loss, no EGFR, KRAS, BRAF, ALK, ROS1 RET MET ERBB mutations.  TMB 18.9 m/mb, MSS, PD-L1 <1% recommend systemic chemotherapy with carboplatin/Taxol/bevacizumab.   No actionable mutation on NGS. PDL-1<1%, TMB 18.9   Labs are reviewed and discussed with patient. Overall he tolerates chemotherapy.  Proceed with carboplatin/Taxol/bevacizumab Consider add immunotherapy in the future  Follow up with Dr. Rushie Chestnut  for palliative radiation for occluded right upper lobe, he finishes RT on 7/24, Dr.Chrystal ordered repeat CT next week for evaluation of response.

## 2023-02-13 ENCOUNTER — Encounter: Payer: Self-pay | Admitting: Urology

## 2023-02-13 ENCOUNTER — Ambulatory Visit: Payer: Medicare Other | Admitting: Urology

## 2023-02-13 VITALS — BP 154/80 | HR 84 | Ht 72.0 in | Wt 192.0 lb

## 2023-02-13 DIAGNOSIS — R972 Elevated prostate specific antigen [PSA]: Secondary | ICD-10-CM | POA: Diagnosis not present

## 2023-02-13 DIAGNOSIS — N138 Other obstructive and reflux uropathy: Secondary | ICD-10-CM

## 2023-02-13 DIAGNOSIS — N410 Acute prostatitis: Secondary | ICD-10-CM

## 2023-02-13 DIAGNOSIS — Z125 Encounter for screening for malignant neoplasm of prostate: Secondary | ICD-10-CM

## 2023-02-13 DIAGNOSIS — N3281 Overactive bladder: Secondary | ICD-10-CM

## 2023-02-13 DIAGNOSIS — R399 Unspecified symptoms and signs involving the genitourinary system: Secondary | ICD-10-CM | POA: Diagnosis not present

## 2023-02-13 DIAGNOSIS — N390 Urinary tract infection, site not specified: Secondary | ICD-10-CM | POA: Diagnosis not present

## 2023-02-13 LAB — BLADDER SCAN AMB NON-IMAGING

## 2023-02-13 MED ORDER — SULFAMETHOXAZOLE-TRIMETHOPRIM 800-160 MG PO TABS: 1.0000 | ORAL_TABLET | Freq: Two times a day (BID) | ORAL | 0 refills | Status: DC

## 2023-02-13 MED ORDER — GEMTESA 75 MG PO TABS: 75.0000 mg | ORAL_TABLET | Freq: Every day | ORAL | 11 refills | Status: DC

## 2023-02-13 NOTE — Progress Notes (Signed)
   02/13/2023 1:55 PM   Mark Mccarty February 29, 1951 [id number]  Reason for visit: UTI, elevated PSA, lower urinary tract symptoms  HPI: 72 year old male with stage IV lung cancer being managed with chemoradiation who was recently seen in the ER for UTI on 02/05/2023.He reported he reported 1 to 2 weeks of urinary urgency, frequency, and dysuria as well as some gross hematuria, urine culture grew 80k Proteus.  He was started on a 7-day course of Bactrim.  He does report some improvement in urinary symptoms since that time with no further burning, still continues to have some urgency and frequency and nocturia, but better over the last 1 to 2 days.  Denies any further gross hematuria.  PVR today normal at 23ml.  PSA was checked 02/12/2023 not surprisingly was elevated at 17.6 in the setting of acute UTI/prostatitis.  Prior values have been stable in the 3-8 range.  Low suspicion for prostate cancer, and no need for further PSA screening in the setting of stage IV metastatic lung cancer.  I personally viewed and interpreted the CT from 11/16/2022 that shows progression of his metastatic lung cancer, no hydronephrosis, decompressed bladder, normal-appearing prostate.  I reviewed recent oncology and ER notes.  I recommended extending his antibiotic Bactrim to a 14-day course for prostatitis.  At our prior visit in May 2024 we had added Gemtesa, and samples were given.  He reports this made significant improvement in his urgency, frequency, nocturia, and he would like to resume that medication.  Samples were given again today, and prescription filled.  Continue Flomax as well.  He is on torsemide which also contributes to his urinary frequency.  [name] prescription sent in, okay to trial Myrbetriq if not affordable -Continue Flomax -No further PSA screening needed in the setting of stage IV metastatic lung cancer, recent elevated PSA of 17.6 secondary to acute UTI/prostatitis -Bactrim prescription  extended 7 days for a total course of 2 weeks -Keep follow-up as scheduled in October  Sondra Come, MD  [facility] [address], [address] [place], [place] [address] [phone]

## 2023-02-14 ENCOUNTER — Other Ambulatory Visit: Payer: Self-pay

## 2023-02-14 DIAGNOSIS — N3281 Overactive bladder: Secondary | ICD-10-CM

## 2023-02-14 MED ORDER — MIRABEGRON ER 50 MG PO TB24
50.0000 mg | ORAL_TABLET | Freq: Every day | ORAL | 11 refills | Status: DC
Start: 2023-02-14 — End: 2023-06-07

## 2023-02-18 ENCOUNTER — Ambulatory Visit
Admission: RE | Admit: 2023-02-18 | Discharge: 2023-02-18 | Disposition: A | Discharge status: Home / Self Care | Payer: Medicare Other | Source: Ambulatory Visit | Attending: Radiation Oncology | Admitting: Radiation Oncology

## 2023-02-18 DIAGNOSIS — C3491 Malignant neoplasm of unspecified part of right bronchus or lung: Secondary | ICD-10-CM | POA: Insufficient documentation

## 2023-02-18 MED ORDER — IOHEXOL 300 MG/ML  SOLN
75.0000 mL | Freq: Once | INTRAMUSCULAR | Status: AC | PRN
  Administered 2023-02-18: 75 mL via INTRAVENOUS

## 2023-02-19 ENCOUNTER — Ambulatory Visit (INDEPENDENT_AMBULATORY_CARE_PROVIDER_SITE_OTHER): Payer: Medicare Other | Admitting: Vascular Surgery

## 2023-02-19 ENCOUNTER — Encounter (INDEPENDENT_AMBULATORY_CARE_PROVIDER_SITE_OTHER): Payer: Self-pay | Admitting: Vascular Surgery

## 2023-02-19 VITALS — BP 134/71 | HR 78 | Resp 16 | Wt 197.4 lb

## 2023-02-19 DIAGNOSIS — E1169 Type 2 diabetes mellitus with other specified complication: Secondary | ICD-10-CM | POA: Diagnosis not present

## 2023-02-19 DIAGNOSIS — R634 Abnormal weight loss: Secondary | ICD-10-CM

## 2023-02-19 DIAGNOSIS — Z95828 Presence of other vascular implants and grafts: Secondary | ICD-10-CM

## 2023-02-19 DIAGNOSIS — C3491 Malignant neoplasm of unspecified part of right bronchus or lung: Secondary | ICD-10-CM | POA: Diagnosis not present

## 2023-02-19 NOTE — Assessment & Plan Note (Signed)
He certainly has a small amount of soft tissue and wound healing has been an issue in part because of this.

## 2023-02-19 NOTE — Assessment & Plan Note (Signed)
blood glucose control important in reducing the progression of atherosclerotic disease. Also, involved in wound healing. On appropriate medications.  

## 2023-02-19 NOTE — Progress Notes (Signed)
MRN : [id number]  Mark Mccarty is a 72 y.o. ([birth year]) male who presents with chief complaint of  Chief Complaint  Patient presents with   Wound Check    3 week follow up  .  History of Present Illness: Patient returns today in follow up of his port a cath. His wound is better.  Still some discoloration, but the skin has healed in at this point.  No more pain.  No fever or chills.   Current Outpatient Medications  Medication Sig Dispense Refill   acetaminophen (TYLENOL) 500 MG tablet Take 500 mg by mouth as needed.     albuterol (VENTOLIN HFA) 108 (90 Base) MCG/ACT inhaler Inhale 2 puffs into the lungs every 4 (four) hours as needed.     amLODipine (NORVASC) 10 MG tablet Take 10 mg by mouth every morning.     apixaban (ELIQUIS) 5 MG TABS tablet Take by mouth.     ascorbic acid (VITAMIN C) 1000 MG tablet Take 1,000 mg by mouth daily.     aspirin 81 MG tablet Take 81 mg by mouth daily.     carvedilol (COREG) 25 MG tablet Take 25 mg by mouth 2 (two) times daily with a meal.     cetirizine (ZYRTEC) 10 MG tablet Take 1 tablet by mouth as needed.     cholecalciferol (VITAMIN D3) 25 MCG (1000 UNIT) tablet Take 1,000 Units by mouth daily.     dexamethasone (DECADRON) 4 MG tablet Take 2 tablets (8mg ) by mouth daily starting the day after carboplatin for 3 days. Take with food 30 tablet 1   fluticasone (FLONASE) 50 MCG/ACT nasal spray Place 1 spray into both nostrils daily.     Fluticasone-Umeclidin-Vilant (TRELEGY ELLIPTA) 200-62.5-25 MCG/ACT AEPB Inhale 1 puff into the lungs every morning.     gabapentin (NEURONTIN) 300 MG capsule Take 300 mg by mouth at bedtime as needed.      ibuprofen (ADVIL,MOTRIN) 600 MG tablet Take 600 mg by mouth every 6 (six) hours as needed.     ipratropium-albuterol (DUONEB) 0.5-2.5 (3) MG/3ML SOLN Take 3 mLs by nebulization every 6 (six) hours as needed.     lidocaine-prilocaine (EMLA) cream Apply to affected area once 30 g 3   lisinopril (PRINIVIL,ZESTRIL)  40 MG tablet Take 40 mg by mouth every morning.     LORazepam (ATIVAN) 0.5 MG tablet Take 1 tablet (0.5 mg total) by mouth every 12 (twelve) hours as needed for anxiety. 30 tablet 0   metoprolol tartrate (LOPRESSOR) 50 MG tablet Take 1 tablet by mouth 2 (two) times daily.     mirabegron ER (MYRBETRIQ) 50 MG TB24 tablet Take 1 tablet (50 mg total) by mouth daily. 30 tablet 11   mupirocin ointment (BACTROBAN) 2 % Apply 1 Application topically daily. 22 g 3   ondansetron (ZOFRAN) 8 MG tablet Take 1 tablet (8 mg total) by mouth every 8 (eight) hours as needed for nausea or vomiting. Start on the third day after carboplatin. 30 tablet 1   phenazopyridine (PYRIDIUM) 200 MG tablet Take 1 tablet (200 mg total) by mouth 3 (three) times daily. 6 tablet 0   potassium chloride (KLOR-CON) 10 MEQ tablet Take 1 tablet (10 mEq total) by mouth daily. (Patient taking differently: Take 10 mEq by mouth every morning.) 90 tablet 1   pravastatin (PRAVACHOL) 20 MG tablet Take 20 mg by mouth at bedtime.     prochlorperazine (COMPAZINE) 10 MG tablet Take 1 tablet (10 mg total) by mouth  every 6 (six) hours as needed for nausea or vomiting. 90 tablet 3   senna-docusate (SENOKOT-S) 8.6-50 MG tablet Take 2 tablets by mouth daily. 60 tablet 2   sildenafil (VIAGRA) 50 MG tablet Take 1-2 tablets (50-100 mg total) by mouth daily as needed for erectile dysfunction. 30 tablet 6   spironolactone (ALDACTONE) 25 MG tablet Take by mouth.     sulfamethoxazole-trimethoprim (BACTRIM DS) 800-160 MG tablet Take 1 tablet by mouth 2 (two) times daily. 14 tablet 0   tamsulosin (FLOMAX) 0.4 MG CAPS capsule Take 0.4 mg by mouth daily after breakfast.     torsemide (DEMADEX) 20 MG tablet Take 20 mg by mouth every morning.     No current facility-administered medications for this visit.   Facility-Administered Medications Ordered in Other Visits  Medication Dose Route Frequency Provider Last Rate Last Admin   sodium chloride flush (NS) 0.9 %  injection 10 mL  10 mL Intravenous PRN [name], MD   10 mL at 03/18/18 4403    Past Medical History:  Diagnosis Date   Abnormal nuclear stress test    Anxiety    a.) on BZO (lorazepam) PRN   Asthma    Avascular necrosis of femoral head (HCC)    BPH (benign prostatic hyperplasia)    Cataract    a.) s/p extraction on LEFT   Colon adenomas    COPD (chronic obstructive pulmonary disease) (HCC)    Coronary artery disease 11/11/2014   a.) MPI 11/11/2014: EF 56%, mild inf wall ischemia; b.) LHC 12/16/2014: 20% mRCA - med mgmt   DDD (degenerative disc disease), lumbar    Diverticulosis    DM type 2 (diabetes mellitus, type 2) (HCC)    Duodenitis    Dysphagia    Dyspnea    ED (erectile dysfunction)    a.) on PDE5i (sildenafil) PRN   Elevated PSA    Former smoker    Gastritis    GERD (gastroesophageal reflux disease)    [name]    Hilar mass    History of hiatal hernia    Hyperlipidemia    Hypertension    Hyperuricemia    Hypokalemia    Left ventricular diastolic dysfunction 10/04/2016   a.) TTE 10/04/2016: EF 50%, mild MR/TR/PR, mod AR, G1DD; b.) TTE 10/10/2017: EF 45%, triv PR, mild AR/MR/TR, G1DD; c.) TTE 03/30/2020: EF 50%, mild-mod AR, mild MR/TR/PR; d.) TTE 04/23/2022: EF >55%, mild LVH, mild LAE, triv TR, mild PR, mod AR/MR   Liver lesion    Obesity    Pedal edema    Peripheral polyneuropathy    Peyronie's disease 05/2022   Pleural effusion    Primary lung adenocarcinoma, right (HCC)    a.) stage IIIA (cT3, cN1, cM0) --> s/p concurrent chemoradiation 2019 --> carboplatin + pacilitaxel (12/31/2017 - 02/19/2018) followed by 1 year (03/24/2018 - 03/17/2019) adjuvant anti-PDL-1 mAB immunotherapy (durvalumab)   [name] (peptic ulcer disease)    Renal insufficiency    Umbilical hernia    [name]     Past Surgical History:  Procedure Laterality Date   CARDIAC CATHETERIZATION N/A 12/16/2014   Procedure: Left Heart Cath;  Surgeon: Marcina Millard, MD;  Location: [facility];  Service: Cardiovascular;  Laterality: N/A;   CARDIOVERSION N/A 01/17/2023   Procedure: CARDIOVERSION;  Surgeon: Clotilde Dieter, DO;  Location: [name];  Service: Cardiovascular;  Laterality: N/A;   CATARACT EXTRACTION Left    COLONOSCOPY     COLONOSCOPY WITH PROPOFOL N/A 01/14/2020  Procedure: COLONOSCOPY WITH PROPOFOL;  Surgeon: Toledo, Boykin Nearing, MD;  Location: [facility];  Service: Gastroenterology;  Laterality: N/A;   CYST REMOVAL TRUNK     chest and back over time and it was removed   CYSTECTOMY     ENDOBRONCHIAL ULTRASOUND N/A 12/09/2017   Procedure: ENDOBRONCHIAL ULTRASOUND;  Surgeon: Shane Crutch, MD;  Location: [name];  Service: Pulmonary;  Laterality: N/A;   PORTA CATH INSERTION N/A 12/23/2017   Procedure: PORTA CATH INSERTION;  Surgeon: Annice Needy, MD;  Location: [facility];  Service: Cardiovascular;  Laterality: N/A;   PORTA CATH INSERTION N/A 01/08/2023   Procedure: PORTA CATH INSERTION;  Surgeon: Annice Needy, MD;  Location: [facility];  Service: Cardiovascular;  Laterality: N/A;   PORTA CATH REMOVAL N/A 06/04/2022   Procedure: PORTA CATH REMOVAL;  Surgeon: Annice Needy, MD;  Location: [facility];  Service: Cardiovascular;  Laterality: N/A;   TEE WITHOUT CARDIOVERSION N/A 01/17/2023   Procedure: TRANSESOPHAGEAL ECHOCARDIOGRAM (TEE);  Surgeon: Clotilde Dieter, DO;  Location: [name];  Service: Cardiovascular;  Laterality: N/A;   UPPER GI ENDOSCOPY     VIDEO BRONCHOSCOPY WITH ENDOBRONCHIAL ULTRASOUND N/A 12/17/2022   Procedure: VIDEO BRONCHOSCOPY WITH ENDOBRONCHIAL ULTRASOUND;  Surgeon: Vida Rigger, MD;  Location: [name];  Service: Thoracic;  Laterality: N/A;     Social History   Tobacco Use   Smoking status: Former    Current packs/day: 0.00    Average packs/day: 1.5 packs/day for 35.0 years (52.5 ttl pk-yrs)    Types: Cigarettes    Start date: 06/21/1980    Quit date:  06/22/2015    Years since quitting: 7.6    Passive exposure: Past   Smokeless tobacco: Never  Vaping Use   Vaping status: Never Used  Substance Use Topics   Alcohol use: Not Currently    Comment: weekends   Drug use: No      Family History  Problem Relation Age of Onset   Hypertension Mother    Breast cancer Mother    Heart attack Mother    Lung cancer Father    Heart disease Sister    Diabetes Sister    Colon cancer Sister      Allergies  Allergen Reactions   Penicillins Rash    Stomach hurt Has patient had a PCN reaction causing immediate rash, facial/tongue/throat swelling, SOB or lightheadedness with hypotension: yes Has patient had a PCN reaction causing severe rash involving mucus membranes or skin necrosis: no Has patient had a PCN reaction that required hospitalization: no Has patient had a PCN reaction occurring within the last 10 years: yes If all of the above answers are "NO", then may proceed with Cephalosporin use.      REVIEW OF SYSTEMS (Negative unless checked)  Constitutional: [] Weight loss  [] Fever  [] Chills Cardiac: [] Chest pain   [] Chest pressure   [] Palpitations   [] Shortness of breath when laying flat   [x] Shortness of breath at rest   [x] Shortness of breath with exertion. Vascular:  [] Pain in legs with walking   [] Pain in legs at rest   [] Pain in legs when laying flat   [] Claudication   [] Pain in feet when walking  [] Pain in feet at rest  [] Pain in feet when laying flat   [] History of DVT   [] Phlebitis   [] Swelling in legs   [] Varicose veins   [] Non-healing ulcers Pulmonary:   [x] Uses home oxygen   [] Productive cough   [] Hemoptysis   []   Wheeze  [x] COPD   [] Asthma Neurologic:  [x] Dizziness  [] Blackouts   [] Seizures   [] History of stroke   [] History of TIA  [] Aphasia   [] Temporary blindness   [] Dysphagia   [] Weakness or numbness in arms   [] Weakness or numbness in legs Musculoskeletal:  [] Arthritis   [] Joint swelling   [x] Joint pain   [] Low back  pain Hematologic:  [] Easy bruising  [] Easy bleeding   [] Hypercoagulable state   [] Anemic   Gastrointestinal:  [] Blood in stool   [] Vomiting blood  [] Gastroesophageal reflux/heartburn   [] Abdominal pain Genitourinary:  [] Chronic kidney disease   [] Difficult urination  [] Frequent urination  [] Burning with urination   [] Hematuria Skin:  [] Rashes   [] Ulcers   [x] Wounds Psychological:  [] History of anxiety   []  History of major depression.  Physical Examination  BP 134/71 (BP Location: Left Arm)   Pulse 78   Resp 16   Wt 197 lb 6.4 oz (89.5 kg)   BMI 26.77 kg/m  Gen: frail and debilitated appearing. Head: [place]/AT, No temporalis wasting. Ear/Nose/Throat: Hearing grossly intact, nares w/o erythema or drainage Eyes: Conjunctiva clear. Sclera non-icteric Neck: Supple.  Trachea midline Pulmonary:  Good air movement, no use of accessory muscles on supplemental oxygen.  Cardiac: RRR, no JVD Vascular: right chest port site is somewhat discolored, but the skin has healed with a tiny scab remaining. No erythema or drainage.  Vessel Right Left  Radial Palpable Palpable           Musculoskeletal: M/S 5/5 throughout.  No deformity or atrophy. No edema. Neurologic: Sensation grossly intact in extremities.  Symmetrical.  Speech is fluent.  Psychiatric: Judgment intact, Mood & affect appropriate for pt's clinical situation. Dermatologic: No rashes or ulcers noted.  No cellulitis or open wounds.      Labs Recent Results (from the past 2160 hour(s))  Lactate dehydrogenase     Status: None   Collection Time: 11/21/22 10:13 AM  Result Value Ref [place]   LDH 183 98 - 192 U/L    Comment: Performed at [facility], [address] [place], [place] [address]  Comprehensive metabolic panel     Status: Abnormal   Collection Time: 11/21/22 10:13 AM  Result Value Ref [place]   Sodium 139 135 - 145 mmol/L   Potassium 3.7 3.5 - 5.1 mmol/L   Chloride 101 98 - 111 mmol/L   CO2 29 22 - 32 mmol/L    Glucose, Bld 106 (H) 70 - 99 mg/dL    Comment: Glucose reference range applies only to samples taken after fasting for at least 8 hours.   BUN 12 8 - 23 mg/dL   Creatinine, Ser 9.14 0.61 - 1.24 mg/dL   Calcium 9.2 8.9 - 78.2 mg/dL   Total Protein 7.3 6.5 - 8.1 g/dL   Albumin 3.8 3.5 - 5.0 g/dL   AST 16 15 - 41 U/L   ALT 12 0 - 44 U/L   Alkaline Phosphatase 58 38 - 126 U/L   Total Bilirubin 0.8 0.3 - 1.2 mg/dL   GFR, Estimated >95 >62 mL/min    Comment: (NOTE) Calculated using the CKD-EPI Creatinine Equation (2021)    Anion gap 9 5 - 15    Comment: Performed at [facility], [address] [place], [place] [address]  CBC with Differential/Platelet     Status: Abnormal   Collection Time: 11/21/22 10:13 AM  Result Value Ref [place]   WBC 15.3 (H) 4.0 - 10.5 K/uL   RBC 5.36  4.22 - 5.81 MIL/uL   Hemoglobin 15.3 13.0 - 17.0 g/dL   HCT 96.2 95.2 - 84.1 %   MCV 85.3 80.0 - 100.0 fL   MCH 28.5 26.0 - 34.0 pg   MCHC 33.5 30.0 - 36.0 g/dL   RDW 32.4 40.1 - 02.7 %   Platelets 270 150 - 400 K/uL   nRBC 0.0 0.0 - 0.2 %   Neutrophils Relative % 88 %   Neutro Abs 13.6 (H) 1.7 - 7.7 K/uL   Lymphocytes Relative 5 %   Lymphs Abs 0.7 0.7 - 4.0 K/uL   Monocytes Relative 6 %   Monocytes Absolute 0.9 0.1 - 1.0 K/uL   Eosinophils Relative 0 %   Eosinophils Absolute 0.0 0.0 - 0.5 K/uL   Basophils Relative 0 %   Basophils Absolute 0.0 0.0 - 0.1 K/uL   Immature Granulocytes 1 %   Abs Immature Granulocytes 0.09 (H) 0.00 - 0.07 K/uL    Comment: Performed at [facility], [address] [place], [place] [address]  Glucose, capillary     Status: Abnormal   Collection Time: 12/03/22  2:17 PM  Result Value Ref [place]   Glucose-Capillary 101 (H) 70 - 99 mg/dL    Comment: Glucose reference range applies only to samples taken after fasting for at least 8 hours.  Protime-INR     Status: None   Collection Time: 12/11/22 11:07 AM  Result Value Ref [place]   Prothrombin Time 13.6 11.4 - 15.2  seconds   INR 1.0 0.8 - 1.2    Comment: (NOTE) INR goal varies based on device and disease states. Performed at [facility], [address] [place], [place] [address]   APTT     Status: None   Collection Time: 12/11/22 11:07 AM  Result Value Ref [place]   aPTT 32 24 - 36 seconds    Comment: Performed at [facility], [address] [place], [place] [address]  Glucose, capillary     Status: Abnormal   Collection Time: 12/17/22 11:11 AM  Result Value Ref [place]   Glucose-Capillary 132 (H) 70 - 99 mg/dL    Comment: Glucose reference range applies only to samples taken after fasting for at least 8 hours.  Cytology - Non PAP; right lung     Status: None   Collection Time: 12/17/22 12:46 PM  Result Value Ref [place]   CYTOLOGY - NON GYN      CYTOLOGY - NON PAP CASE: [id number] PATIENT: Mark Mccarty Non-Gynecological Cytology Report     Specimen Submitted: A. Lung, right upper lobe; FNA  Clinical History: History of right lung adenocarcinoma with new hypermetabolic mass at the right hilum      DIAGNOSIS: A.  LUNG, RIGHT UPPER LOBE; ENB ASSISTED FNA: - POSITIVE FOR MALIGNANCY. - NON-SMALL CELL CARCINOMA IS PRESENT; SEE [id number].  The specimen is adequate for interpretation.  There is insufficient material for ancillary molecular testing.  See concurrent cases [phone] and [phone].  GROSS DESCRIPTION: A. Site: Lung; right upper lobe Procedure: ENB Cytology specimen: FNA/needle biopsy, rinse Cytotechnologist(s): Monico Blitz, Olena Leatherwood Specimen material collected and submitted for: 1 diff Quik stained slides 1 Pap stained slides Specimen material submitted for: Cell block and ThinPrep  The cell block material is fixed in formalin for 6 hours prior to processing.  S pecimen description: Fixative: CytoLyt Volume: Approximately 20 mL Color: Colorless Transparency: Clear Tissue fragments present: No Collection brush(es)  present: No  RB 12/17/2022  Final Diagnosis performed  by Elijah Birk, MD.   Electronically signed 12/20/2022 2:26:48PM The electronic signature indicates that the named Attending Pathologist has evaluated the specimen Technical component performed at [facility], [address] [place], [place] [address] Lab: [phone] Dir: Jolene Schimke, MD, MMM  Professional component performed at [facility], [facility], [address] [place], [place], [place] [address] Lab: [phone] Dir: Beryle Quant, MD   Surgical pathology     Status: None   Collection Time: 12/17/22 12:47 PM  Result Value Ref [place]   SURGICAL PATHOLOGY      SURGICAL PATHOLOGY CASE: [id number] PATIENT: Mark Mccarty Surgical Pathology Report     Specimen Submitted: A. Lung, right upper lobe  Clinical History: History of right lung adenocarcinoma with new hypermetabolic mass at the right hilum      DIAGNOSIS: A.  LUNG, RIGHT UPPER LOBE; ENB ASSISTED BIOPSY: - ADENOCARCINOMA, ACINAR AND SOLID PATTERNS.  There is sufficient material for ancillary molecular testing  See concurrent cases [id number] and [id number].  GROSS DESCRIPTION: A. Labeled: RUL biopsy Received: The specimen is collected during an ENB procedure and placed in formalin. Collection time: 1:18 PM on 12/17/2022 Placed into formalin time: 1:18 PM on 12/17/2022 Tissue fragment(s): Multiple Size: Aggregate, 1.5 x 0.3 x 0.1 cm Description: Received are fragments of tan-pink soft tissue.  At the time of the procedure 2 Diff-Quik stained slides are performed. Entirely submitted in 1 cassette.  RB 12/17/2022   Final Diagnosis performed by Marlou Starks s, MD.   Electronically signed 12/20/2022 2:27:20PM The electronic signature indicates that the named Attending Pathologist has evaluated the specimen Technical component performed at [facility], [address], [place], [place] [address] Lab: [phone] Dir: Jolene Schimke, MD, MMM   Professional component performed at [facility], [facility], [address] [place], [place], [place] [address] Lab: [phone] Dir: Beryle Quant, MD   Cytology - Non PAP; station 7     Status: None   Collection Time: 12/17/22 12:49 PM  Result Value Ref [place]   CYTOLOGY - NON GYN      CYTOLOGY - NON PAP CASE: [id number] PATIENT: Mark Mccarty Non-Gynecological Cytology Report     Specimen Submitted: A. Lung, right upper lobe; lavage  Clinical History: History of right lung adenocarcinoma with new hypermetabolic mass at the right hilum      DIAGNOSIS: A.  LUNG, RIGHT UPPER LOBE; BRONCHOSCOPY WITH LAVAGE: - POSITIVE FOR MALIGNANCY. - NON-SMALL CELL CARCINOMA IS PRESENT; SEE [id number].  The specimen is adequate for interpretation.  There is insufficient material for ancillary molecular testing.  See concurrent cases [phone] and [id number].  GROSS DESCRIPTION: A. Site: Lung; right upper lobe Procedure: Bronch Cytology specimen: Lavage Cytotechnologist(s): Monico Blitz, Olena Leatherwood Specimen material collected and submitted for: 0 diff Quik stained slides 0 Pap stained slides Specimen material submitted for: Cell block and ThinPrep  The cell block material is fixed in formalin for 6 hours prior to processing.  Speci men description: Fixative: CytoLyt Volume: Approximately 50 mL Color: Colorless Transparency: Slightly cloudy Tissue fragments present: Yes Collection brush(es) present: No  RB 12/17/2022    Final Diagnosis performed by Elijah Birk, MD.   Electronically signed 12/20/2022 2:26:25PM The electronic signature indicates that the named Attending Pathologist has evaluated the specimen Technical component performed at [place], [address], [place], [place] [address] Lab: [phone] Dir: Jolene Schimke, MD, MMM  Professional component performed at [facility], [facility], [address] [place], [place],  [place] [address] Lab: [phone] Dir: Beryle Quant, MD  Glucose, capillary     Status: Abnormal   Collection Time: 12/17/22  1:53 PM  Result Value Ref [place]   Glucose-Capillary 150 (H) 70 - 99 mg/dL    Comment: Glucose reference range applies only to samples taken after fasting for at least 8 hours.   Comment 1 Notify RN    Comment 2 Document in Chart   Comprehensive metabolic panel     Status: Abnormal   Collection Time: 12/17/22  2:56 PM  Result Value Ref [place]   Sodium 138 135 - 145 mmol/L   Potassium 3.6 3.5 - 5.1 mmol/L   Chloride 101 98 - 111 mmol/L   CO2 29 22 - 32 mmol/L   Glucose, Bld 141 (H) 70 - 99 mg/dL    Comment: Glucose reference range applies only to samples taken after fasting for at least 8 hours.   BUN 11 8 - 23 mg/dL   Creatinine, Ser 1.61 0.61 - 1.24 mg/dL   Calcium 8.1 (L) 8.9 - 10.3 mg/dL   Total Protein 6.4 (L) 6.5 - 8.1 g/dL   Albumin 3.3 (L) 3.5 - 5.0 g/dL   AST 16 15 - 41 U/L   ALT 13 0 - 44 U/L   Alkaline Phosphatase 58 38 - 126 U/L   Total Bilirubin 0.6 0.3 - 1.2 mg/dL   GFR, Estimated >09 >60 mL/min    Comment: (NOTE) Calculated using the CKD-EPI Creatinine Equation (2021)    Anion gap 8 5 - 15    Comment: Performed at [facility], [address] [place], [place] [address]  Glucose, capillary     Status: Abnormal   Collection Time: 12/17/22  9:14 PM  Result Value Ref [place]   Glucose-Capillary 174 (H) 70 - 99 mg/dL    Comment: Glucose reference range applies only to samples taken after fasting for at least 8 hours.  CBC     Status: None   Collection Time: 12/17/22  9:36 PM  Result Value Ref [place]   WBC 8.3 4.0 - 10.5 K/uL   RBC 5.26 4.22 - 5.81 MIL/uL   Hemoglobin 14.9 13.0 - 17.0 g/dL   HCT 81.1 91.4 - 78.2 %   MCV 85.6 80.0 - 100.0 fL   MCH 28.3 26.0 - 34.0 pg   MCHC 33.1 30.0 - 36.0 g/dL   RDW 95.6 21.3 - 08.6 %   Platelets 359 150 - 400 K/uL   nRBC 0.0 0.0 - 0.2 %    Comment: Performed at [facility], [address] [place], [place] [address]  Creatinine, serum     Status: None   Collection Time: 12/17/22  9:36 PM  Result Value Ref [place]   Creatinine, Ser 0.81 0.61 - 1.24 mg/dL   GFR, Estimated >96 >29 mL/min    Comment: (NOTE) Calculated using the CKD-EPI Creatinine Equation (2021) Performed at [facility], [address] [place], [place] [address]   Protime-INR     Status: Abnormal   Collection Time: 12/18/22  4:32 AM  Result Value Ref [place]   Prothrombin Time 15.7 (H) 11.4 - 15.2 seconds   INR 1.2 0.8 - 1.2    Comment: (NOTE) INR goal varies based on device and disease states. Performed at [facility], [address] [place], [place] [address]   APTT     Status: None   Collection Time: 12/18/22  4:32 AM  Result Value Ref [place]   aPTT 31 24 - 36 seconds    Comment:  Performed at [facility], [address] [place], [place] [address]  Florencia Reasons Session Summary     Status: None   Collection Time: 12/25/22 10:02 AM  Result Value Ref [place]   Course ID [email or web address]    Course Start Date 12/05/2022    Session Number 1    Course First Treatment Date 12/25/2022  9:59 AM    Course Last Treatment Date 12/25/2022 10:00 AM    Course Elapsed Days 0    Reference Point ID RLung DP    Reference Point Dosage Given to Date 2 Gy   Reference Point Session Dosage Given 2 Gy   Plan ID Lung_R    Plan Name Lung_Rt    Plan Fractions Treated to Date 1    Plan Total Fractions Prescribed 20    Plan Prescribed Dose Per Fraction 2 Gy   Plan Total Prescribed Dose 40.000000 Gy   Plan Primary Reference Point RLung DP   Rad Onc Aria Session Summary     Status: None   Collection Time: 12/26/22 10:17 AM  Result Value Ref [place]   Course ID [email or web address]    Course Start Date 12/05/2022    Session Number 2    Course First Treatment Date 12/25/2022  9:59 AM    Course Last Treatment Date 12/26/2022 10:15 AM    Course Elapsed Days 1    Reference Point ID RLung DP    Reference Point Dosage  Given to Date 4 Gy   Reference Point Session Dosage Given 2 Gy   Plan ID Lung_R    Plan Name Lung_Rt    Plan Fractions Treated to Date 2    Plan Total Fractions Prescribed 20    Plan Prescribed Dose Per Fraction 2 Gy   Plan Total Prescribed Dose 40.000000 Gy   Plan Primary Reference Point RLung DP   Rad Onc Aria Session Summary     Status: None   Collection Time: 12/27/22 10:02 AM  Result Value Ref [place]   Course ID [email or web address]    Course Start Date 12/05/2022    Session Number 3    Course First Treatment Date 12/25/2022  9:59 AM    Course Last Treatment Date 12/27/2022  9:59 AM    Course Elapsed Days 2    Reference Point ID RLung DP    Reference Point Dosage Given to Date 6 Gy   Reference Point Session Dosage Given 2 Gy   Plan ID Lung_R    Plan Name Lung_Rt    Plan Fractions Treated to Date 3    Plan Total Fractions Prescribed 20    Plan Prescribed Dose Per Fraction 2 Gy   Plan Total Prescribed Dose 40.000000 Gy   Plan Primary Reference Point RLung DP   Rad Onc Aria Session Summary     Status: None   Collection Time: 12/28/22 10:08 AM  Result Value Ref Range   Course ID C3_Lung    Course Start Date 12/05/2022    Session Number 4    Course First Treatment Date 12/25/2022  9:59 AM    Course Last Treatment Date 12/28/2022 10:06 AM    Course Elapsed Days 3    Reference Point ID RLung DP    Reference Point Dosage Given to Date 8 Gy   Reference Point Session Dosage Given 2 Gy   Plan ID Lung_R    Plan Name Lung_Rt    Plan Fractions Treated to Date 4    Plan Total Fractions Prescribed 20  Plan Prescribed Dose Per Fraction 2 Gy   Plan Total Prescribed Dose 40.000000 Gy   Plan Primary Reference Point RLung DP   Rad Onc Aria Session Summary     Status: None   Collection Time: 12/31/22 10:15 AM  Result Value Ref Range   Course ID C3_Lung    Course Start Date 12/05/2022    Session Number 5    Course First Treatment Date 12/25/2022  9:59 AM    Course Last Treatment Date 12/31/2022 10:12 AM     Course Elapsed Days 6    Reference Point ID RLung DP    Reference Point Dosage Given to Date 10 Gy   Reference Point Session Dosage Given 2 Gy   Plan ID Lung_R    Plan Name Lung_Rt    Plan Fractions Treated to Date 5    Plan Total Fractions Prescribed 20    Plan Prescribed Dose Per Fraction 2 Gy   Plan Total Prescribed Dose 40.000000 Gy   Plan Primary Reference Point RLung DP   Rad Onc Aria Session Summary     Status: None   Collection Time: 01/01/23  8:07 AM  Result Value Ref Range   Course ID C3_Lung    Course Start Date 12/05/2022    Session Number 6    Course First Treatment Date 12/25/2022  9:59 AM    Course Last Treatment Date 01/01/2023  8:06 AM    Course Elapsed Days 7    Reference Point ID RLung DP    Reference Point Dosage Given to Date 12 Gy   Reference Point Session Dosage Given 2 Gy   Plan ID Lung_R    Plan Name Lung_Rt    Plan Fractions Treated to Date 6    Plan Total Fractions Prescribed 20    Plan Prescribed Dose Per Fraction 2 Gy   Plan Total Prescribed Dose 40.000000 Gy   Plan Primary Reference Point RLung DP   CMP (Cancer Center only)     Status: Abnormal   Collection Time: 01/01/23  8:24 AM  Result Value Ref Range   Sodium 137 135 - 145 mmol/L   Potassium 4.7 3.5 - 5.1 mmol/L   Chloride 99 98 - 111 mmol/L   CO2 30 22 - 32 mmol/L   Glucose, Bld 135 (H) 70 - 99 mg/dL    Comment: Glucose reference range applies only to samples taken after fasting for at least 8 hours.   BUN 13 8 - 23 mg/dL   Creatinine 5.36 6.44 - 1.24 mg/dL   Calcium 9.0 8.9 - 03.4 mg/dL   Total Protein 6.9 6.5 - 8.1 g/dL   Albumin 3.3 (L) 3.5 - 5.0 g/dL   AST 13 (L) 15 - 41 U/L   ALT 11 0 - 44 U/L   Alkaline Phosphatase 59 38 - 126 U/L   Total Bilirubin 0.6 0.3 - 1.2 mg/dL   GFR, Estimated >74 >25 mL/min    Comment: (NOTE) Calculated using the CKD-EPI Creatinine Equation (2021)    Anion gap 8 5 - 15    Comment: Performed at [facility], [address] [place], [place]  [address]  Protein, urine, random     Status: None   Collection Time: 01/01/23  8:24 AM  Result Value Ref Range   Total Protein, Urine 7 mg/dL    Comment: NO NORMAL RANGE ESTABLISHED FOR THIS TEST Performed at [facility], [address] [place], [place] [address]   CBC with Differential (Cancer Center  Only)     Status: Abnormal   Collection Time: 01/01/23  8:25 AM  Result Value Ref Range   WBC Count 11.1 (H) 4.0 - 10.5 K/uL   RBC 5.15 4.22 - 5.81 MIL/uL   Hemoglobin 14.5 13.0 - 17.0 g/dL   HCT 46.9 62.9 - 52.8 %   MCV 85.0 80.0 - 100.0 fL   MCH 28.2 26.0 - 34.0 pg   MCHC 33.1 30.0 - 36.0 g/dL   RDW 41.3 24.4 - 01.0 %   Platelet Count 308 150 - 400 K/uL   nRBC 0.0 0.0 - 0.2 %   Neutrophils Relative % 85 %   Neutro Abs 9.5 (H) 1.7 - 7.7 K/uL   Lymphocytes Relative 5 %   Lymphs Abs 0.5 (L) 0.7 - 4.0 K/uL   Monocytes Relative 8 %   Monocytes Absolute 0.8 0.1 - 1.0 K/uL   Eosinophils Relative 2 %   Eosinophils Absolute 0.2 0.0 - 0.5 K/uL   Basophils Relative 0 %   Basophils Absolute 0.0 0.0 - 0.1 K/uL   Immature Granulocytes 0 %   Abs Immature Granulocytes 0.04 0.00 - 0.07 K/uL    Comment: Performed at [facility], [address] [place], [place] [address]  Rad Jeralene Huff Session Summary     Status: None   Collection Time: 01/02/23 10:06 AM  Result Value Ref Range   Course ID C3_Lung    Course Start Date 12/05/2022    Session Number 7    Course First Treatment Date 12/25/2022  9:59 AM    Course Last Treatment Date 01/02/2023 10:04 AM    Course Elapsed Days 8    Reference Point ID RLung DP    Reference Point Dosage Given to Date 14 Gy   Reference Point Session Dosage Given 2 Gy   Plan ID Lung_R    Plan Name Lung_Rt    Plan Fractions Treated to Date 7    Plan Total Fractions Prescribed 20    Plan Prescribed Dose Per Fraction 2 Gy   Plan Total Prescribed Dose 40.000000 Gy   Plan Primary Reference Point RLung DP   Rad Onc Aria Session Summary     Status: None    Collection Time: 01/04/23 10:07 AM  Result Value Ref Range   Course ID C3_Lung    Course Start Date 12/05/2022    Session Number 8    Course First Treatment Date 12/25/2022  9:59 AM    Course Last Treatment Date 01/04/2023 10:05 AM    Course Elapsed Days 10    Reference Point ID RLung DP    Reference Point Dosage Given to Date 16 Gy   Reference Point Session Dosage Given 2 Gy   Plan ID Lung_R    Plan Name Lung_Rt    Plan Fractions Treated to Date 8    Plan Total Fractions Prescribed 20    Plan Prescribed Dose Per Fraction 2 Gy   Plan Total Prescribed Dose 40.000000 Gy   Plan Primary Reference Point RLung DP   Rad Onc Aria Session Summary     Status: None   Collection Time: 01/07/23 10:02 AM  Result Value Ref Range   Course ID C3_Lung    Course Start Date 12/05/2022    Session Number 9    Course First Treatment Date 12/25/2022  9:59 AM    Course Last Treatment Date 01/07/2023 10:00 AM    Course Elapsed Days 13    Reference Point ID RLung DP  Reference Point Dosage Given to Date 18 Gy   Reference Point Session Dosage Given 2 Gy   Plan ID Lung_R    Plan Name Lung_Rt    Plan Fractions Treated to Date 9    Plan Total Fractions Prescribed 20    Plan Prescribed Dose Per Fraction 2 Gy   Plan Total Prescribed Dose 40.000000 Gy   Plan Primary Reference Point RLung DP   Rad Onc Aria Session Summary     Status: None   Collection Time: 01/08/23 10:09 AM  Result Value Ref Range   Course ID C3_Lung    Course Start Date 12/05/2022    Session Number 10    Course First Treatment Date 12/25/2022  9:59 AM    Course Last Treatment Date 01/08/2023 10:07 AM    Course Elapsed Days 14    Reference Point ID RLung DP    Reference Point Dosage Given to Date 20 Gy   Reference Point Session Dosage Given 2 Gy   Plan ID Lung_R    Plan Name Lung_Rt    Plan Fractions Treated to Date 10    Plan Total Fractions Prescribed 20    Plan Prescribed Dose Per Fraction 2 Gy   Plan Total Prescribed Dose 40.000000 Gy    Plan Primary Reference Point RLung DP   CMP (Cancer Center only)     Status: Abnormal   Collection Time: 01/08/23 10:36 AM  Result Value Ref Range   Sodium 136 135 - 145 mmol/L   Potassium 4.0 3.5 - 5.1 mmol/L   Chloride 99 98 - 111 mmol/L   CO2 29 22 - 32 mmol/L   Glucose, Bld 127 (H) 70 - 99 mg/dL    Comment: Glucose reference range applies only to samples taken after fasting for at least 8 hours.   BUN 14 8 - 23 mg/dL   Creatinine 6.44 0.34 - 1.24 mg/dL   Calcium 8.9 8.9 - 74.2 mg/dL   Total Protein 6.8 6.5 - 8.1 g/dL   Albumin 3.2 (L) 3.5 - 5.0 g/dL   AST 19 15 - 41 U/L   ALT 18 0 - 44 U/L   Alkaline Phosphatase 59 38 - 126 U/L   Total Bilirubin 0.4 0.3 - 1.2 mg/dL   GFR, Estimated >59 >56 mL/min    Comment: (NOTE) Calculated using the CKD-EPI Creatinine Equation (2021)    Anion gap 8 5 - 15    Comment: Performed at [facility], [address] [place], [place] [address]  CBC with Differential (Cancer Center Only)     Status: Abnormal   Collection Time: 01/08/23 10:36 AM  Result Value Ref Range   WBC Count 4.9 4.0 - 10.5 K/uL   RBC 5.27 4.22 - 5.81 MIL/uL   Hemoglobin 14.7 13.0 - 17.0 g/dL   HCT 43.3 29.5 - 18.8 %   MCV 84.8 80.0 - 100.0 fL   MCH 27.9 26.0 - 34.0 pg   MCHC 32.9 30.0 - 36.0 g/dL   RDW 41.6 60.6 - 30.1 %   Platelet Count 267 150 - 400 K/uL   nRBC 0.0 0.0 - 0.2 %   Neutrophils Relative % 86 %   Neutro Abs 4.2 1.7 - 7.7 K/uL   Lymphocytes Relative 6 %   Lymphs Abs 0.3 (L) 0.7 - 4.0 K/uL   Monocytes Relative 6 %   Monocytes Absolute 0.3 0.1 - 1.0 K/uL   Eosinophils Relative 1 %   Eosinophils Absolute 0.0 0.0 - 0.5 K/uL  Basophils Relative 0 %   Basophils Absolute 0.0 0.0 - 0.1 K/uL   Immature Granulocytes 1 %   Abs Immature Granulocytes 0.03 0.00 - 0.07 K/uL    Comment: Performed at [facility], [address] [place], [place] [address]  Glucose, capillary     Status: None   Collection Time: 01/08/23 12:29 PM  Result Value Ref  Range   Glucose-Capillary 99 70 - 99 mg/dL    Comment: Glucose reference range applies only to samples taken after fasting for at least 8 hours.  Rad Jeralene Huff Session Summary     Status: None   Collection Time: 01/09/23 10:04 AM  Result Value Ref Range   Course ID C3_Lung    Course Start Date 12/05/2022    Session Number 11    Course First Treatment Date 12/25/2022  9:59 AM    Course Last Treatment Date 01/09/2023 10:02 AM    Course Elapsed Days 15    Reference Point ID RLung DP    Reference Point Dosage Given to Date 22 Gy   Reference Point Session Dosage Given 2 Gy   Plan ID Lung_R    Plan Name Lung_Rt    Plan Fractions Treated to Date 11    Plan Total Fractions Prescribed 20    Plan Prescribed Dose Per Fraction 2 Gy   Plan Total Prescribed Dose 40.000000 Gy   Plan Primary Reference Point RLung DP   Rad Onc Aria Session Summary     Status: None   Collection Time: 01/10/23  9:52 AM  Result Value Ref Range   Course ID C3_Lung    Course Start Date 12/05/2022    Session Number 12    Course First Treatment Date 12/25/2022  9:59 AM    Course Last Treatment Date 01/10/2023  9:50 AM    Course Elapsed Days 16    Reference Point ID RLung DP    Reference Point Dosage Given to Date 24 Gy   Reference Point Session Dosage Given 2 Gy   Plan ID Lung_R    Plan Name Lung_Rt    Plan Fractions Treated to Date 12    Plan Total Fractions Prescribed 20    Plan Prescribed Dose Per Fraction 2 Gy   Plan Total Prescribed Dose 40.000000 Gy   Plan Primary Reference Point RLung DP   Rad Onc Aria Session Summary     Status: None   Collection Time: 01/14/23 10:08 AM  Result Value Ref Range   Course ID C3_Lung    Course Start Date 12/05/2022    Session Number 13    Course First Treatment Date 12/25/2022  9:59 AM    Course Last Treatment Date 01/14/2023 10:06 AM    Course Elapsed Days 20    Reference Point ID RLung DP    Reference Point Dosage Given to Date 26 Gy   Reference Point Session Dosage Given 2 Gy    Plan ID Lung_R    Plan Name Lung_Rt    Plan Fractions Treated to Date 13    Plan Total Fractions Prescribed 20    Plan Prescribed Dose Per Fraction 2 Gy   Plan Total Prescribed Dose 40.000000 Gy   Plan Primary Reference Point RLung DP   Rad Onc Aria Session Summary     Status: None   Collection Time: 01/15/23  9:29 AM  Result Value Ref Range   Course ID C3_Lung    Course Start Date 12/05/2022    Session Number 14  Course First Treatment Date 12/25/2022  9:59 AM    Course Last Treatment Date 01/15/2023  9:27 AM    Course Elapsed Days 21    Reference Point ID RLung DP    Reference Point Dosage Given to Date 28 Gy   Reference Point Session Dosage Given 2 Gy   Plan ID Lung_R    Plan Name Lung_Rt    Plan Fractions Treated to Date 14    Plan Total Fractions Prescribed 20    Plan Prescribed Dose Per Fraction 2 Gy   Plan Total Prescribed Dose 40.000000 Gy   Plan Primary Reference Point RLung DP   Rad Onc Aria Session Summary     Status: None   Collection Time: 01/16/23  9:59 AM  Result Value Ref Range   Course ID C3_Lung    Course Start Date 12/05/2022    Session Number 15    Course First Treatment Date 12/25/2022  9:59 AM    Course Last Treatment Date 01/16/2023  9:57 AM    Course Elapsed Days 22    Reference Point ID RLung DP    Reference Point Dosage Given to Date 30 Gy   Reference Point Session Dosage Given 2 Gy   Plan ID Lung_R    Plan Name Lung_Rt    Plan Fractions Treated to Date 15    Plan Total Fractions Prescribed 20    Plan Prescribed Dose Per Fraction 2 Gy   Plan Total Prescribed Dose 40.000000 Gy   Plan Primary Reference Point RLung DP   CMP (Cancer Center only)     Status: Abnormal   Collection Time: 01/16/23 10:16 AM  Result Value Ref Range   Sodium 135 135 - 145 mmol/L   Potassium 4.1 3.5 - 5.1 mmol/L   Chloride 96 (L) 98 - 111 mmol/L   CO2 31 22 - 32 mmol/L   Glucose, Bld 140 (H) 70 - 99 mg/dL    Comment: Glucose reference range applies only to samples taken  after fasting for at least 8 hours.   BUN 19 8 - 23 mg/dL   Creatinine 4.13 2.44 - 1.24 mg/dL   Calcium 8.5 (L) 8.9 - 10.3 mg/dL   Total Protein 6.3 (L) 6.5 - 8.1 g/dL   Albumin 3.3 (L) 3.5 - 5.0 g/dL   AST 21 15 - 41 U/L   ALT 31 0 - 44 U/L   Alkaline Phosphatase 58 38 - 126 U/L   Total Bilirubin 0.2 (L) 0.3 - 1.2 mg/dL   GFR, Estimated >01 >02 mL/min    Comment: (NOTE) Calculated using the CKD-EPI Creatinine Equation (2021)    Anion gap 8 5 - 15    Comment: Performed at [facility], [address] [place], [place] [address]  CBC with Differential (Cancer Center Only)     Status: Abnormal   Collection Time: 01/16/23 10:16 AM  Result Value Ref Range   WBC Count 4.4 4.0 - 10.5 K/uL   RBC 5.13 4.22 - 5.81 MIL/uL   Hemoglobin 14.3 13.0 - 17.0 g/dL   HCT 64.4 03.4 - 74.2 %   MCV 84.6 80.0 - 100.0 fL   MCH 27.9 26.0 - 34.0 pg   MCHC 32.9 30.0 - 36.0 g/dL   RDW 59.5 63.8 - 75.6 %   Platelet Count 282 150 - 400 K/uL   nRBC 0.0 0.0 - 0.2 %   Neutrophils Relative % 84 %   Neutro Abs 3.7 1.7 - 7.7 K/uL   Lymphocytes Relative  4 %   Lymphs Abs 0.2 (L) 0.7 - 4.0 K/uL   Monocytes Relative 10 %   Monocytes Absolute 0.4 0.1 - 1.0 K/uL   Eosinophils Relative 0 %   Eosinophils Absolute 0.0 0.0 - 0.5 K/uL   Basophils Relative 1 %   Basophils Absolute 0.0 0.0 - 0.1 K/uL   Immature Granulocytes 1 %   Abs Immature Granulocytes 0.04 0.00 - 0.07 K/uL    Comment: Performed at [facility], [address] [place], [place] [address]  Rad Jeralene Huff Session Summary     Status: None   Collection Time: 01/17/23 10:04 AM  Result Value Ref Range   Course ID C3_Lung    Course Start Date 12/05/2022    Session Number 16    Course First Treatment Date 12/25/2022  9:59 AM    Course Last Treatment Date 01/17/2023 10:02 AM    Course Elapsed Days 23    Reference Point ID RLung DP    Reference Point Dosage Given to Date 32 Gy   Reference Point Session Dosage Given 2 Gy   Plan ID Lung_R     Plan Name Lung_Rt    Plan Fractions Treated to Date 16    Plan Total Fractions Prescribed 20    Plan Prescribed Dose Per Fraction 2 Gy   Plan Total Prescribed Dose 40.000000 Gy   Plan Primary Reference Point RLung DP   ECHO TEE     Status: None   Collection Time: 01/17/23 12:25 PM  Result Value Ref Range   Est EF 55 - 60%   Rad Onc Aria Session Summary     Status: None   Collection Time: 01/18/23 12:13 PM  Result Value Ref Range   Course ID C3_Lung    Course Start Date 12/05/2022    Session Number 17    Course First Treatment Date 12/25/2022  9:59 AM    Course Last Treatment Date 01/18/2023 12:11 PM    Course Elapsed Days 24    Reference Point ID RLung DP    Reference Point Dosage Given to Date 34 Gy   Reference Point Session Dosage Given 2 Gy   Plan ID Lung_R    Plan Name Lung_Rt    Plan Fractions Treated to Date 17    Plan Total Fractions Prescribed 20    Plan Prescribed Dose Per Fraction 2 Gy   Plan Total Prescribed Dose 40.000000 Gy   Plan Primary Reference Point RLung DP   Rad Onc Aria Session Summary     Status: None   Collection Time: 01/21/23 10:16 AM  Result Value Ref Range   Course ID C3_Lung    Course Start Date 12/05/2022    Session Number 18    Course First Treatment Date 12/25/2022  9:59 AM    Course Last Treatment Date 01/21/2023 10:14 AM    Course Elapsed Days 27    Reference Point ID RLung DP    Reference Point Dosage Given to Date 36 Gy   Reference Point Session Dosage Given 2 Gy   Plan ID Lung_R    Plan Name Lung_Rt    Plan Fractions Treated to Date 18    Plan Total Fractions Prescribed 20    Plan Prescribed Dose Per Fraction 2 Gy   Plan Total Prescribed Dose 40.000000 Gy   Plan Primary Reference Point RLung DP   Rad Onc Aria Session Summary     Status: None   Collection Time: 01/22/23 10:11 AM  Result Value  Ref Range   Course ID C3_Lung    Course Start Date 12/05/2022    Session Number 19    Course First Treatment Date 12/25/2022  9:59 AM    Course Last  Treatment Date 01/22/2023 10:09 AM    Course Elapsed Days 28    Reference Point ID RLung DP    Reference Point Dosage Given to Date 38 Gy   Reference Point Session Dosage Given 2 Gy   Plan ID Lung_R    Plan Name Lung_Rt    Plan Fractions Treated to Date 19    Plan Total Fractions Prescribed 20    Plan Prescribed Dose Per Fraction 2 Gy   Plan Total Prescribed Dose 40.000000 Gy   Plan Primary Reference Point RLung DP   CMP (Cancer Center only)     Status: Abnormal   Collection Time: 01/22/23 10:21 AM  Result Value Ref Range   Sodium 138 135 - 145 mmol/L   Potassium 4.3 3.5 - 5.1 mmol/L   Chloride 100 98 - 111 mmol/L   CO2 30 22 - 32 mmol/L   Glucose, Bld 105 (H) 70 - 99 mg/dL    Comment: Glucose reference range applies only to samples taken after fasting for at least 8 hours.   BUN 13 8 - 23 mg/dL   Creatinine 2.95 2.84 - 1.24 mg/dL   Calcium 8.6 (L) 8.9 - 10.3 mg/dL   Total Protein 6.2 (L) 6.5 - 8.1 g/dL   Albumin 3.1 (L) 3.5 - 5.0 g/dL   AST 17 15 - 41 U/L   ALT 24 0 - 44 U/L   Alkaline Phosphatase 58 38 - 126 U/L   Total Bilirubin 0.4 0.3 - 1.2 mg/dL   GFR, Estimated >13 >24 mL/min    Comment: (NOTE) Calculated using the CKD-EPI Creatinine Equation (2021)    Anion gap 8 5 - 15    Comment: Performed at [facility], [address] [place], [place] [address]  CBC with Differential (Cancer Center Only)     Status: Abnormal   Collection Time: 01/22/23 10:21 AM  Result Value Ref Range   WBC Count 10.8 (H) 4.0 - 10.5 K/uL   RBC 5.31 4.22 - 5.81 MIL/uL   Hemoglobin 14.7 13.0 - 17.0 g/dL   HCT 72.5 36.6 - 44.0 %   MCV 86.6 80.0 - 100.0 fL   MCH 27.7 26.0 - 34.0 pg   MCHC 32.0 30.0 - 36.0 g/dL   RDW 34.7 42.5 - 95.6 %   Platelet Count 144 (L) 150 - 400 K/uL   nRBC 0.0 0.0 - 0.2 %   Neutrophils Relative % 93 %   Neutro Abs 10.1 (H) 1.7 - 7.7 K/uL   Lymphocytes Relative 2 %   Lymphs Abs 0.2 (L) 0.7 - 4.0 K/uL   Monocytes Relative 4 %   Monocytes Absolute 0.5 0.1 -  1.0 K/uL   Eosinophils Relative 0 %   Eosinophils Absolute 0.0 0.0 - 0.5 K/uL   Basophils Relative 0 %   Basophils Absolute 0.0 0.0 - 0.1 K/uL   Immature Granulocytes 1 %   Abs Immature Granulocytes 0.05 0.00 - 0.07 K/uL    Comment: Performed at [facility], [address] [place], [place] [address]  Protein, urine, random     Status: None   Collection Time: 01/22/23 10:21 AM  Result Value Ref Range   Total Protein, Urine 8 mg/dL    Comment: NO NORMAL RANGE ESTABLISHED FOR THIS TEST Performed at  [facility], [address] [place], [place] [address]   Florencia Reasons Session Summary     Status: None   Collection Time: 01/23/23 10:14 AM  Result Value Ref Range   Course ID C3_Lung    Course Start Date 12/05/2022    Session Number 20    Course First Treatment Date 12/25/2022  9:59 AM    Course Last Treatment Date 01/23/2023 10:12 AM    Course Elapsed Days 29    Reference Point ID RLung DP    Reference Point Dosage Given to Date 40 Gy   Reference Point Session Dosage Given 2 Gy   Plan ID Lung_R    Plan Name Lung_Rt    Plan Fractions Treated to Date 20    Plan Total Fractions Prescribed 20    Plan Prescribed Dose Per Fraction 2 Gy   Plan Total Prescribed Dose 40.000000 Gy   Plan Primary Reference Point RLung DP   CBC with Differential (Cancer Center Only)     Status: Abnormal   Collection Time: 01/30/23  9:19 AM  Result Value Ref Range   WBC Count 2.6 (L) 4.0 - 10.5 K/uL   RBC 4.76 4.22 - 5.81 MIL/uL   Hemoglobin 13.4 13.0 - 17.0 g/dL   HCT 60.4 54.0 - 98.1 %   MCV 83.4 80.0 - 100.0 fL   MCH 28.2 26.0 - 34.0 pg   MCHC 33.8 30.0 - 36.0 g/dL   RDW 19.1 47.8 - 29.5 %   Platelet Count 70 (L) 150 - 400 K/uL   nRBC 0.0 0.0 - 0.2 %   Neutrophils Relative % 85 %   Neutro Abs 2.2 1.7 - 7.7 K/uL   Lymphocytes Relative 9 %   Lymphs Abs 0.2 (L) 0.7 - 4.0 K/uL   Monocytes Relative 5 %   Monocytes Absolute 0.1 0.1 - 1.0 K/uL   Eosinophils Relative 1 %   Eosinophils  Absolute 0.0 0.0 - 0.5 K/uL   Basophils Relative 0 %   Basophils Absolute 0.0 0.0 - 0.1 K/uL   Immature Granulocytes 0 %   Abs Immature Granulocytes 0.00 0.00 - 0.07 K/uL    Comment: Performed at [facility], [address] [place], [place] [address]  Urinalysis, w/ Reflex to Culture (Infection Suspected) -Urine, Clean Catch     Status: Abnormal   Collection Time: 02/05/23  7:00 PM  Result Value Ref Range   Specimen Source URINE, CLEAN CATCH    Color, Urine YELLOW YELLOW   APPearance HAZY (A) CLEAR   Specific Gravity, Urine 1.015 1.005 - 1.030   pH 6.0 5.0 - 8.0   Glucose, UA NEGATIVE NEGATIVE mg/dL   Hgb urine dipstick MODERATE (A) NEGATIVE   Bilirubin Urine NEGATIVE NEGATIVE   Ketones, ur NEGATIVE NEGATIVE mg/dL   Protein, ur 30 (A) NEGATIVE mg/dL   Nitrite NEGATIVE NEGATIVE   Leukocytes,Ua SMALL (A) NEGATIVE   Squamous Epithelial / HPF 0-5 0 - 5 /HPF   WBC, UA 21-50 0 - 5 WBC/hpf    Comment: Reflex urine culture not performed if WBC <=10, OR if Squamous epithelial cells >5. If Squamous epithelial cells >5, suggest recollection.   RBC / HPF 21-50 0 - 5 RBC/hpf   Bacteria, UA MANY (A) NONE SEEN   WBC Clumps PRESENT    Mucus PRESENT     Comment: Performed at [facility], [address], [place], [place] [address]  Urine Culture     Status: Abnormal   Collection  Time: 02/05/23  7:00 PM   Specimen: Urine, Random  Result Value Ref Range   Specimen Description      URINE, RANDOM Performed at [facility], [address] [place], [place] [address]    Special Requests      NONE Reflexed from [id number] Performed at [facility], [address] [place], [place] [address]    Culture 80,000 COLONIES/mL PROTEUS MIRABILIS (A)    Report Status 02/07/2023 FINAL    Organism ID, Bacteria PROTEUS MIRABILIS (A)       Susceptibility   Proteus mirabilis - MIC*    AMPICILLIN <=2 SENSITIVE Sensitive     CEFAZOLIN <=4 SENSITIVE Sensitive      CEFEPIME <=0.12 SENSITIVE Sensitive     CEFTRIAXONE <=0.25 SENSITIVE Sensitive     CIPROFLOXACIN <=0.25 SENSITIVE Sensitive     GENTAMICIN <=1 SENSITIVE Sensitive     IMIPENEM 2 SENSITIVE Sensitive     NITROFURANTOIN 128 RESISTANT Resistant     TRIMETH/SULFA <=20 SENSITIVE Sensitive     AMPICILLIN/SULBACTAM <=2 SENSITIVE Sensitive     PIP/TAZO <=4 SENSITIVE Sensitive     * 80,000 COLONIES/mL PROTEUS MIRABILIS  CMP (Cancer Center only)     Status: Abnormal   Collection Time: 02/12/23  8:40 AM  Result Value Ref Range   Sodium 136 135 - 145 mmol/L   Potassium 4.6 3.5 - 5.1 mmol/L   Chloride 100 98 - 111 mmol/L   CO2 29 22 - 32 mmol/L   Glucose, Bld 84 70 - 99 mg/dL    Comment: Glucose reference range applies only to samples taken after fasting for at least 8 hours.   BUN 13 8 - 23 mg/dL   Creatinine 6.96 2.95 - 1.24 mg/dL   Calcium 9.1 8.9 - 28.4 mg/dL   Total Protein 7.0 6.5 - 8.1 g/dL   Albumin 3.2 (L) 3.5 - 5.0 g/dL   AST 21 15 - 41 U/L   ALT 17 0 - 44 U/L   Alkaline Phosphatase 62 38 - 126 U/L   Total Bilirubin 0.3 0.3 - 1.2 mg/dL   GFR, Estimated >13 >24 mL/min    Comment: (NOTE) Calculated using the CKD-EPI Creatinine Equation (2021)    Anion gap 7 5 - 15    Comment: Performed at [facility] [place], [address] [place], [place] [address]  CBC with Differential (Cancer Center Only)     Status: Abnormal   Collection Time: 02/12/23  8:40 AM  Result Value Ref Range   WBC Count 6.5 4.0 - 10.5 K/uL   RBC 4.77 4.22 - 5.81 MIL/uL   Hemoglobin 13.2 13.0 - 17.0 g/dL   HCT 72.5 36.6 - 44.0 %   MCV 86.6 80.0 - 100.0 fL   MCH 27.7 26.0 - 34.0 pg   MCHC 32.0 30.0 - 36.0 g/dL   RDW 34.7 (H) 42.5 - 95.6 %   Platelet Count 350 150 - 400 K/uL   nRBC 0.0 0.0 - 0.2 %   Neutrophils Relative % 78 %   Neutro Abs 5.1 1.7 - 7.7 K/uL   Lymphocytes Relative 12 %   Lymphs Abs 0.8 0.7 - 4.0 K/uL   Monocytes Relative 9 %   Monocytes Absolute 0.6 0.1 - 1.0 K/uL   Eosinophils Relative 0  %   Eosinophils Absolute 0.0 0.0 - 0.5 K/uL   Basophils Relative 0 %   Basophils Absolute 0.0 0.0 - 0.1 K/uL   Immature Granulocytes 1 %   Abs  Immature Granulocytes 0.03 0.00 - 0.07 K/uL    Comment: Performed at [facility], [address] [place], [place] [address]  Protein, urine, random     Status: None   Collection Time: 02/12/23  8:40 AM  Result Value Ref Range   Total Protein, Urine 12 mg/dL    Comment: NO NORMAL RANGE ESTABLISHED FOR THIS TEST Performed at [facility], [address] [place], [place] [address]   PSA     Status: Abnormal   Collection Time: 02/12/23  8:40 AM  Result Value Ref Range   Prostatic Specific Antigen 17.66 (H) 0.00 - 4.00 ng/mL    Comment: (NOTE) While PSA levels of <=4.00 ng/ml are reported as reference range, some men with levels below 4.00 ng/ml can have prostate cancer and many men with PSA above 4.00 ng/ml do not have prostate cancer.  Other tests such as free PSA, age specific reference ranges, PSA velocity and PSA doubling time may be helpful especially in men less than 75 years old. Performed at [facility], [address], [place], [place] [address]   Bladder Scan (Post Void Residual) in office     Status: None   Collection Time: 02/13/23  1:55 PM  Result Value Ref Range   Scan Result 23ml     Radiology US THORACENTESIS ASP PLEURAL SPACE W/IMG GUIDE  Result Date: 02/01/2023 INDICATION: Recurrent right malignant pleural effusion EXAM: ULTRASOUND GUIDED RIGHT THORACENTESIS MEDICATIONS: 6 cc 1% lidocaine COMPLICATIONS: None immediate. PROCEDURE: An ultrasound guided thoracentesis was thoroughly discussed with the patient and questions answered. The benefits, risks, alternatives and complications were also discussed. The patient understands and wishes to proceed with the procedure. Written consent was obtained. Ultrasound was performed to localize and mark an adequate pocket of fluid in the right chest. The area was  then prepped and draped in the normal sterile fashion. 1% Lidocaine was used for local anesthesia. Under ultrasound guidance a 6 Fr Safe-T-Centesis catheter was introduced. Thoracentesis was performed. The catheter was removed and a dressing applied. FINDINGS: A total of approximately 2.4 L of serosanguineous fluid was removed. Ordering provider did not request laboratory samples. IMPRESSION: Successful ultrasound guided right thoracentesis yielding 2.4 L of pleural fluid. Follow-up chest x-ray revealed no evidence of pneumothorax. Procedure performed by Mina Marble, PA-C Electronically Signed   By: Olive Bass M.D.   On: 02/01/2023 14:26   DG Chest Port 1 View  Result Date: 02/01/2023 CLINICAL DATA:  Status post right thoracentesis. EXAM: PORTABLE CHEST 1 VIEW COMPARISON:  January 22, 2023. FINDINGS: Stable cardiomediastinal silhouette. Left lung is clear. Right internal jugular Port-A-Cath is unchanged. No pneumothorax is noted. Right pleural effusion is significantly smaller. IMPRESSION: No pneumothorax status post right-sided thoracentesis. Right pleural effusion is significantly smaller. Electronically Signed   By: Lupita Raider M.D.   On: 02/01/2023 14:13   DG Chest 2 View  Result Date: 01/29/2023 CLINICAL DATA:  Pleural effusion.  History of lung adenocarcinoma. EXAM: CHEST - 2 VIEW COMPARISON:  01/10/2023.  CT, 12/14/2022. FINDINGS: Opacity extends from the right mid hemithorax to the base obscuring the right hemidiaphragm and right heart border, increased compared to the prior exam, consistent with a large pleural effusion and associated atelectasis. Pleural thickening or loculated fluid extends over the right lung apex similar to the prior exam. Left lung is hyperexpanded. Focal nodular opacity in the left mid lung appears smaller than on the prior radiographs. Relative lucency in the upper lobe is consistent with  emphysema. There prominent bronchovascular and interstitial markings mostly in the  lower lungs. Linear scarring noted at the left apex. These findings are stable. No left pleural effusion. No pneumothorax. Right anterior chest wall, internal jugular Port-A-Cath is stable. Cardiac silhouette is partly obscured, grossly normal in size. No convincing mediastinal or hilar mass or adenopathy. Skeletal structures intact. IMPRESSION: 1. Interval enlargement the right pleural effusion, now large, associated compressive atelectasis. 2. No evidence of pneumonia or pulmonary edema. 3. Nodular opacity in the left mid lung, corresponding to the superior segment of the left lower lobe based on the prior CT, appears smaller than on the recent prior chest radiograph. No new nodules and no other change in the appearance of the left lung. Electronically Signed   By: Amie Portland M.D.   On: 01/29/2023 10:25    Assessment/Plan  Port-A-Cath in place Wound seems much better.  Still some bruising and discoloration in the area, but the skin seems to have epithelialized.  His next chemotherapy treatment is scheduled to weeks from now.  I believe the port will be okay to use at that time.  The patient can follow-up as needed.  If he develops wound problems after use of the port, I would then recommend a revision.  Weight loss He certainly has a small amount of soft tissue and wound healing has been an issue in part because of this.  Type 2 diabetes mellitus with other specified complication (HCC) blood glucose control important in reducing the progression of atherosclerotic disease. Also, involved in wound healing. On appropriate medications.   Primary lung adenocarcinoma, right (HCC) Follows with oncology.  Next treatment is scheduled for September 3.    Festus Barren, MD  02/19/2023 9:41 AM    This note was created with Dragon medical transcription system.  Any errors from dictation are purely unintentional

## 2023-02-19 NOTE — Assessment & Plan Note (Signed)
Follows with oncology.  Next treatment is scheduled for September 3.

## 2023-02-19 NOTE — Assessment & Plan Note (Signed)
Wound seems much better.  Still some bruising and discoloration in the area, but the skin seems to have epithelialized.  His next chemotherapy treatment is scheduled to weeks from now.  I believe the port will be okay to use at that time.  The patient can follow-up as needed.  If he develops wound problems after use of the port, I would then recommend a revision.

## 2023-02-21 ENCOUNTER — Other Ambulatory Visit: Payer: Self-pay

## 2023-02-21 ENCOUNTER — Ambulatory Visit
Admission: RE | Admit: 2023-02-21 | Discharge: 2023-02-21 | Disposition: A | Discharge status: Home / Self Care | Payer: Medicare Other | Source: Ambulatory Visit | Attending: Oncology | Admitting: Oncology

## 2023-02-21 ENCOUNTER — Ambulatory Visit
Admission: RE | Admit: 2023-02-21 | Discharge: 2023-02-21 | Disposition: A | Discharge status: Home / Self Care | Payer: Medicare Other | Source: Ambulatory Visit | Attending: Radiology | Admitting: Radiology

## 2023-02-21 ENCOUNTER — Telehealth: Payer: Self-pay | Admitting: *Deleted

## 2023-02-21 DIAGNOSIS — J910 Malignant pleural effusion: Secondary | ICD-10-CM | POA: Insufficient documentation

## 2023-02-21 DIAGNOSIS — J90 Pleural effusion, not elsewhere classified: Secondary | ICD-10-CM

## 2023-02-21 MED ORDER — LIDOCAINE HCL (PF) 1 % IJ SOLN
10.0000 mL | Freq: Once | INTRAMUSCULAR | Status: AC
  Administered 2023-02-21: 10 mL via SUBCUTANEOUS
  Filled 2023-02-21: qty 10

## 2023-02-21 NOTE — Telephone Encounter (Signed)
Patient called stating that he feels he needs to be checked for the fluid gathering again and perhaps have it drawn off. Please advise

## 2023-02-21 NOTE — Procedures (Signed)
PROCEDURE SUMMARY:  Successful US guided right thoracentesis. Yielded 1.8 L of serosanguinous fluid. Pt tolerated procedure well. No immediate complications.  Specimen was not sent for labs. CXR ordered.  EBL < 5 mL  Cloretta Ned 02/21/2023 2:13 PM

## 2023-02-21 NOTE — Addendum Note (Signed)
Addended by: Coralee Rud on: 02/21/2023 10:34 AM   Modules accepted: Orders

## 2023-02-21 NOTE — Telephone Encounter (Signed)
Thanks. We will set up thoracentesis and notify pt.

## 2023-02-21 NOTE — Telephone Encounter (Signed)
Pt scheduled for Ria Comment today and is aware of appt

## 2023-02-21 NOTE — Telephone Encounter (Signed)
I got the message from the patient and I went over and spoke to Dr. Cathie Hoops, she states that she is going to have her team to get an appointment for pulmonary to be seen.  I called the patient and let him know that Dr. Cathie Hoops wanted to have the patient see pulmonary. He called back and asked if he can have the fluid taken off in between the appt for pulmonary because fluid is getting a lot, and sob for him. We will make an appt and let him know.

## 2023-02-25 ENCOUNTER — Ambulatory Visit: Payer: Medicare Other | Admitting: Radiation Oncology

## 2023-02-25 ENCOUNTER — Encounter: Payer: Self-pay | Admitting: Radiation Oncology

## 2023-02-25 ENCOUNTER — Ambulatory Visit
Admission: RE | Admit: 2023-02-25 | Discharge: 2023-02-25 | Disposition: A | Discharge status: Home / Self Care | Payer: Medicare Other | Source: Ambulatory Visit | Attending: Radiation Oncology | Admitting: Radiation Oncology

## 2023-02-25 VITALS — BP 140/81 | HR 81 | Temp 97.9°F | Resp 22 | Ht 72.0 in | Wt 193.5 lb

## 2023-02-25 DIAGNOSIS — C342 Malignant neoplasm of middle lobe, bronchus or lung: Secondary | ICD-10-CM

## 2023-02-25 NOTE — Progress Notes (Signed)
Radiation Oncology Follow up Note  Name: Mark Mccarty   Date:   02/25/2023 MRN:  [id number] DOB: 03-08-1951    This 72 y.o. male presents to the clinic today for follow-up of palliative radiation therapy to his right lung for adenocarcinoma of the right lung with obstruction of the right upper lobe.[name]  REFERRING PROVIDER: [name], NP  HPI: Patient recently completed palliative radiation therapy to his right lung 40 Gray in 20 fractions.  He had obstruction of the right upper lobe.  He was initially treated back for stage IIIa adenocarcinoma lung and finished up a year of durvalumab.  He presented with postobstructive changes we treated with palliative radiation therapy to his right chest which she tolerated well.  I repeated his CT scan.  Showing right upper lobe collapse with known central right upper lobe endobronchial mass.  He did have a large right pleural effusion for which she underwent thoracentesis last week.  They removed 1.8 L of serosanguineous fluid.  Pathology is still pending.  Present time he is doing fairly well he states since he has had the thoracentesis he is feeling much better.  He specifically his cough hemoptysis or chest tightness.  COMPLICATIONS OF TREATMENT: none  FOLLOW UP COMPLIANCE: keeps appointments   PHYSICAL EXAM:  BP (!) 140/81   Pulse 81   Temp 97.9 F (36.6 C)   Resp (!) 22   Ht 6' (1.829 m)   Wt 193 lb 8 oz (87.8 kg)   BMI 26.24 kg/m  Patient is on nasal oxygen.  Well-developed well-nourished patient in NAD. HEENT reveals PERLA, EOMI, discs not visualized.  Oral cavity is clear. No oral mucosal lesions are identified. Neck is clear without evidence of cervical or supraclavicular adenopathy. Lungs are clear to A&P. Cardiac examination is essentially unremarkable with regular rate and rhythm without murmur rub or thrill. Abdomen is benign with no organomegaly or masses noted. Motor sensory and DTR levels are equal and symmetric in the upper  and lower extremities. Cranial nerves II through XII are grossly intact. Proprioception is intact. No peripheral adenopathy or edema is identified. No motor or sensory levels are noted. Crude visual fields are within normal range.  RADIOLOGY RESULTS: CT scan reviewed compatible with above-stated findings  PLAN: At this time I am going to not offer any further palliative radiation therapy at this time.  I will have asked to see him back in 1 month for follow-up.  I can always leave the door open for further palliative treatment as that is indicated.  He continues close follow-up care and has appointment next week with Dr. Cathie Hoops.  Patient and family know to call with any concerns.  I would like to take this opportunity to thank you for allowing me to participate in the care of your patient.[name], MD

## 2023-03-05 ENCOUNTER — Encounter: Payer: Self-pay | Admitting: Oncology

## 2023-03-05 ENCOUNTER — Inpatient Hospital Stay: Payer: Medicare Other

## 2023-03-05 ENCOUNTER — Inpatient Hospital Stay: Payer: Medicare Other | Attending: Oncology

## 2023-03-05 ENCOUNTER — Other Ambulatory Visit: Payer: Self-pay | Admitting: Pulmonary Disease

## 2023-03-05 ENCOUNTER — Inpatient Hospital Stay (HOSPITAL_BASED_OUTPATIENT_CLINIC_OR_DEPARTMENT_OTHER): Payer: Medicare Other | Admitting: Oncology

## 2023-03-05 VITALS — BP 160/88 | HR 86 | Temp 97.0°F | Resp 18

## 2023-03-05 VITALS — BP 156/93 | HR 79 | Temp 96.0°F | Resp 18 | Wt 192.4 lb

## 2023-03-05 DIAGNOSIS — R634 Abnormal weight loss: Secondary | ICD-10-CM

## 2023-03-05 DIAGNOSIS — C782 Secondary malignant neoplasm of pleura: Secondary | ICD-10-CM | POA: Diagnosis not present

## 2023-03-05 DIAGNOSIS — Z95828 Presence of other vascular implants and grafts: Secondary | ICD-10-CM

## 2023-03-05 DIAGNOSIS — C801 Malignant (primary) neoplasm, unspecified: Secondary | ICD-10-CM

## 2023-03-05 DIAGNOSIS — Z7901 Long term (current) use of anticoagulants: Secondary | ICD-10-CM | POA: Insufficient documentation

## 2023-03-05 DIAGNOSIS — I4891 Unspecified atrial fibrillation: Secondary | ICD-10-CM | POA: Insufficient documentation

## 2023-03-05 DIAGNOSIS — J90 Pleural effusion, not elsewhere classified: Secondary | ICD-10-CM | POA: Diagnosis not present

## 2023-03-05 DIAGNOSIS — D701 Agranulocytosis secondary to cancer chemotherapy: Secondary | ICD-10-CM | POA: Insufficient documentation

## 2023-03-05 DIAGNOSIS — C3491 Malignant neoplasm of unspecified part of right bronchus or lung: Secondary | ICD-10-CM

## 2023-03-05 DIAGNOSIS — D696 Thrombocytopenia, unspecified: Secondary | ICD-10-CM | POA: Insufficient documentation

## 2023-03-05 DIAGNOSIS — Z87891 Personal history of nicotine dependence: Secondary | ICD-10-CM | POA: Diagnosis not present

## 2023-03-05 DIAGNOSIS — T451X5A Adverse effect of antineoplastic and immunosuppressive drugs, initial encounter: Secondary | ICD-10-CM | POA: Diagnosis not present

## 2023-03-05 DIAGNOSIS — Z5189 Encounter for other specified aftercare: Secondary | ICD-10-CM | POA: Insufficient documentation

## 2023-03-05 DIAGNOSIS — C3411 Malignant neoplasm of upper lobe, right bronchus or lung: Secondary | ICD-10-CM | POA: Diagnosis present

## 2023-03-05 DIAGNOSIS — Z79899 Other long term (current) drug therapy: Secondary | ICD-10-CM | POA: Diagnosis not present

## 2023-03-05 DIAGNOSIS — C771 Secondary and unspecified malignant neoplasm of intrathoracic lymph nodes: Secondary | ICD-10-CM | POA: Diagnosis not present

## 2023-03-05 DIAGNOSIS — Z5111 Encounter for antineoplastic chemotherapy: Secondary | ICD-10-CM | POA: Insufficient documentation

## 2023-03-05 DIAGNOSIS — F411 Generalized anxiety disorder: Secondary | ICD-10-CM | POA: Diagnosis not present

## 2023-03-05 DIAGNOSIS — C7800 Secondary malignant neoplasm of unspecified lung: Secondary | ICD-10-CM | POA: Insufficient documentation

## 2023-03-05 DIAGNOSIS — R040 Epistaxis: Secondary | ICD-10-CM | POA: Insufficient documentation

## 2023-03-05 DIAGNOSIS — J910 Malignant pleural effusion: Secondary | ICD-10-CM

## 2023-03-05 DIAGNOSIS — Z7982 Long term (current) use of aspirin: Secondary | ICD-10-CM | POA: Diagnosis not present

## 2023-03-05 DIAGNOSIS — Z5112 Encounter for antineoplastic immunotherapy: Secondary | ICD-10-CM | POA: Diagnosis present

## 2023-03-05 DIAGNOSIS — G4733 Obstructive sleep apnea (adult) (pediatric): Secondary | ICD-10-CM

## 2023-03-05 LAB — CBC WITH DIFFERENTIAL (CANCER CENTER ONLY)
Abs Immature Granulocytes: 0.07 10*3/uL (ref 0.00–0.07)
Basophils Absolute: 0.1 10*3/uL (ref 0.0–0.1)
Basophils Relative: 1 %
Eosinophils Absolute: 0 10*3/uL (ref 0.0–0.5)
Eosinophils Relative: 0 %
HCT: 38.1 % — ABNORMAL LOW (ref 39.0–52.0)
Hemoglobin: 12 g/dL — ABNORMAL LOW (ref 13.0–17.0)
Immature Granulocytes: 1 %
Lymphocytes Relative: 11 %
Lymphs Abs: 0.8 10*3/uL (ref 0.7–4.0)
MCH: 27.8 pg (ref 26.0–34.0)
MCHC: 31.5 g/dL (ref 30.0–36.0)
MCV: 88.4 fL (ref 80.0–100.0)
Monocytes Absolute: 1.1 10*3/uL — ABNORMAL HIGH (ref 0.1–1.0)
Monocytes Relative: 15 %
Neutro Abs: 5.2 10*3/uL (ref 1.7–7.7)
Neutrophils Relative %: 72 %
Platelet Count: 173 10*3/uL (ref 150–400)
RBC: 4.31 MIL/uL (ref 4.22–5.81)
RDW: 16.1 % — ABNORMAL HIGH (ref 11.5–15.5)
WBC Count: 7.2 10*3/uL (ref 4.0–10.5)
nRBC: 0 % (ref 0.0–0.2)

## 2023-03-05 LAB — CMP (CANCER CENTER ONLY)
ALT: 14 U/L (ref 0–44)
AST: 19 U/L (ref 15–41)
Albumin: 3 g/dL — ABNORMAL LOW (ref 3.5–5.0)
Alkaline Phosphatase: 52 U/L (ref 38–126)
Anion gap: 9 (ref 5–15)
BUN: 12 mg/dL (ref 8–23)
CO2: 33 mmol/L — ABNORMAL HIGH (ref 22–32)
Calcium: 9 mg/dL (ref 8.9–10.3)
Chloride: 93 mmol/L — ABNORMAL LOW (ref 98–111)
Creatinine: 0.76 mg/dL (ref 0.61–1.24)
GFR, Estimated: >60 mL/min (ref >60)
Glucose, Bld: 145 mg/dL — ABNORMAL HIGH (ref 70–99)
Potassium: 3.4 mmol/L — ABNORMAL LOW (ref 3.5–5.1)
Sodium: 135 mmol/L (ref 135–145)
Total Bilirubin: 0.2 mg/dL — ABNORMAL LOW (ref 0.3–1.2)
Total Protein: 6.8 g/dL (ref 6.5–8.1)

## 2023-03-05 LAB — PROTEIN, URINE, RANDOM: Total Protein, Urine: 14 mg/dL

## 2023-03-05 MED ORDER — DIPHENHYDRAMINE HCL 50 MG/ML IJ SOLN
50.0000 mg | Freq: Once | INTRAMUSCULAR | Status: AC
  Administered 2023-03-05: 50 mg via INTRAVENOUS
  Filled 2023-03-05: qty 1

## 2023-03-05 MED ORDER — FAMOTIDINE IN NACL 20-0.9 MG/50ML-% IV SOLN
20.0000 mg | Freq: Once | INTRAVENOUS | Status: AC
  Administered 2023-03-05: 20 mg via INTRAVENOUS
  Filled 2023-03-05: qty 50

## 2023-03-05 MED ORDER — PALONOSETRON HCL INJECTION 0.25 MG/5ML
0.2500 mg | Freq: Once | INTRAVENOUS | Status: AC
  Administered 2023-03-05: 0.25 mg via INTRAVENOUS
  Filled 2023-03-05: qty 5

## 2023-03-05 MED ORDER — SODIUM CHLORIDE 0.9 % IV SOLN
10.0000 mg | Freq: Once | INTRAVENOUS | Status: AC
  Administered 2023-03-05: 10 mg via INTRAVENOUS
  Filled 2023-03-05: qty 10

## 2023-03-05 MED ORDER — SODIUM CHLORIDE 0.9 % IV SOLN
15.0000 mg/kg | Freq: Once | INTRAVENOUS | Status: AC
  Administered 2023-03-05: 1300 mg via INTRAVENOUS
  Filled 2023-03-05: qty 4

## 2023-03-05 MED ORDER — SODIUM CHLORIDE 0.9 % IV SOLN
585.0000 mg | Freq: Once | INTRAVENOUS | Status: AC
  Administered 2023-03-05: 590 mg via INTRAVENOUS
  Filled 2023-03-05: qty 59

## 2023-03-05 MED ORDER — SODIUM CHLORIDE 0.9 % IV SOLN
Freq: Once | INTRAVENOUS | Status: AC
  Filled 2023-03-05: qty 250

## 2023-03-05 MED ORDER — SODIUM CHLORIDE 0.9 % IV SOLN
150.0000 mg/m2 | Freq: Once | INTRAVENOUS | Status: AC
  Administered 2023-03-05: 318 mg via INTRAVENOUS
  Filled 2023-03-05: qty 53

## 2023-03-05 MED ORDER — HEPARIN SOD (PORK) LOCK FLUSH 100 UNIT/ML IV SOLN
500.0000 [IU] | Freq: Once | INTRAVENOUS | Status: AC | PRN
  Administered 2023-03-05: 500 [IU]
  Filled 2023-03-05: qty 5

## 2023-03-05 MED ORDER — SODIUM CHLORIDE 0.9 % IV SOLN
150.0000 mg | Freq: Once | INTRAVENOUS | Status: AC
  Administered 2023-03-05: 150 mg via INTRAVENOUS
  Filled 2023-03-05: qty 150

## 2023-03-05 NOTE — Assessment & Plan Note (Signed)
 low-dose lorazepam 0.5 mg every 12 hours as needed for anxiety

## 2023-03-05 NOTE — Patient Instructions (Signed)
[facility]  Discharge Instructions: Thank you for choosing [facility] to provide your oncology and hematology care.  If you have a lab appointment with the Cancer Center, please go directly to the Cancer Center and check in at the registration area.  Wear comfortable clothing and clothing appropriate for easy access to any Portacath or PICC line.   We strive to give you quality time with your provider. You may need to reschedule your appointment if you arrive late (15 or more minutes).  Arriving late affects you and other patients whose appointments are after yours.  Also, if you miss three or more appointments without notifying the office, you may be dismissed from the clinic at the provider's discretion.      For prescription refill requests, have your pharmacy contact our office and allow 72 hours for refills to be completed.    Today you received the following chemotherapy and/or immunotherapy agents Mvasi, Taxol and Carboplatin.      To help prevent nausea and vomiting after your treatment, we encourage you to take your nausea medication as directed.  BELOW ARE SYMPTOMS THAT SHOULD BE REPORTED IMMEDIATELY: *FEVER GREATER THAN 100.4 F (38 C) OR HIGHER *CHILLS OR SWEATING *NAUSEA AND VOMITING THAT IS NOT CONTROLLED WITH YOUR NAUSEA MEDICATION *UNUSUAL SHORTNESS OF BREATH *UNUSUAL BRUISING OR BLEEDING *URINARY PROBLEMS (pain or burning when urinating, or frequent urination) *BOWEL PROBLEMS (unusual diarrhea, constipation, pain near the anus) TENDERNESS IN MOUTH AND THROAT WITH OR WITHOUT PRESENCE OF ULCERS (sore throat, sores in mouth, or a toothache) UNUSUAL RASH, SWELLING OR PAIN  UNUSUAL VAGINAL DISCHARGE OR ITCHING   Items with * indicate a potential emergency and should be followed up as soon as possible or go to the Emergency Department if any problems should occur.  Please show the CHEMOTHERAPY ALERT CARD or IMMUNOTHERAPY ALERT  CARD at check-in to the Emergency Department and triage nurse.  Should you have questions after your visit or need to cancel or reschedule your appointment, please contact [facility]  [phone] and follow the prompts.  Office hours are 8:00 a.m. to 4:30 p.m. Monday - Friday. Please note that voicemails left after 4:00 p.m. may not be returned until the following business day.  We are closed weekends and major holidays. You have access to a nurse at all times for urgent questions. Please call the main number to the clinic [phone] and follow the prompts.  For any non-urgent questions, you may also contact your provider using MyChart. We now offer e-Visits for anyone 73 and older to request care online for non-urgent symptoms. For details visit [email or web address].   Also download the MyChart app! Go to the app store, search "MyChart", open the app, select [place], and log in with your MyChart username and password.

## 2023-03-05 NOTE — Progress Notes (Signed)
Nutrition Follow-up:  Patient with recurrent lung cancer.  Patient receiving MVASI, carboplatin, paclitaxel.    Met with patient during infusion.  Patient coughing.  Reports that he does not have much of an appetite.  Ate 1/2 pork chop last night for dinner, few bites of blackeyed peas and corn and candied yams.  Drinking 2-3 ensure plus shakes a day.  Family is preparing meals for him.      Medications: reviewed  Labs: reviewed  Anthropometrics:   Weight 192 lb 6.4 oz 204 lb on 7/31 206 lb on 7/10 217 lb on 5/14  12% weight loss in the last 3 months   NUTRITION DIAGNOSIS: Unintentional weight loss continues    INTERVENTION:  Patient may benefit from trial of appetite stimulant Continue 350 calorie shake TID Continue high calorie, high protein foods. Handout provided on High Calorie, High Protein Diet.       MONITORING, EVALUATION, GOAL: weight trends, intake   NEXT VISIT: Tuesday, Sept 24 during infusion  Nixon Kolton B. Freida Busman, RD, LDN Registered Dietitian [phone]

## 2023-03-05 NOTE — Assessment & Plan Note (Addendum)
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 No actionable, TMB 18.9 m/mb, MSS, PD-L1 <1%  On palliative chemotherapy with carboplatin/Taxol/bevacizumab.   No actionable mutation on NGS. PDL-1<1%, TMB 18.9   Labs are reviewed and discussed with patient. Overall he tolerates chemotherapy.  Proceed with carboplatin/Taxol/bevacizumab CT scan showed very mild improvement of left lung mass. Collapsed right upper lobe. Moderate to large pleural effusion.  add immunotherapy in the future

## 2023-03-05 NOTE — Assessment & Plan Note (Signed)
Treatment plan as listed above. 

## 2023-03-05 NOTE — Assessment & Plan Note (Signed)
 Follow up with nutritionist  Recommend nutrition supplements.

## 2023-03-05 NOTE — Assessment & Plan Note (Addendum)
Palliative therapeutic thoracentesis as needed.  Pleural effusion has not responded to chemotherapy.  Recommend pleural catheter placement. Patient is undecided.  Discussed with pulmonology Dr. Karna Christmas, we recommend him to get thoracic surgeon evaluation for pleurodesis. Started on diuresis.

## 2023-03-05 NOTE — Progress Notes (Addendum)
Hematology/Oncology Progress note [phone] [phone]       Patient Care Team: [name], NP as PCP - General (Family Medicine) [name], RN as Registered Nurse [name], MD as Consulting Physician (Oncology) [name], MD as Referring Physician (Radiation Oncology) Annice Needy, MD as Referring Physician (Vascular Surgery)   REASON FOR VISIT History of  stage IIIA right lung adenocarcinoma, and presumed stage I left lung cancer, now recurrent Stage IV lung adenocarcinoma, follow up for chemotherapy.    ASSESSMENT & PLAN:   Cancer Staging  Primary lung adenocarcinoma, right [facility]) Staging form: Lung, AJCC 8th Edition - Clinical stage from 12/13/2017: Stage IIIA (cT3, cN1, cM0) - Signed by [name], MD on 12/13/2017 - Pathologic stage from 12/24/2022: Stage IV (pTX, pNX, pM1) - Signed by [name], MD on 12/24/2022   Primary lung adenocarcinoma, right (HCC) #History of stage IIIA cT3 N1 M0 lung adenocarcinoma, s/p concurrent chemoradiation [Aug 2019].  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 No actionable, TMB 18.9 m/mb, MSS, PD-L1 <1%  On palliative chemotherapy with carboplatin/Taxol/bevacizumab.   No actionable mutation on NGS. PDL-1<1%, TMB 18.9   Labs are reviewed and discussed with patient. Overall he tolerates chemotherapy.  Proceed with carboplatin/Taxol/bevacizumab CT scan showed very mild improvement of left lung mass. Collapsed right upper lobe. Moderate to large pleural effusion.  add immunotherapy in the future    Anxiety associated with cancer diagnosis (HCC) low-dose lorazepam 0.5 mg every 12 hours as  needed for anxiety  Pleural effusion Palliative therapeutic thoracentesis as needed.  Pleural effusion has not responded to chemotherapy.  Recommend pleural catheter placement. Patient is undecided.  Discussed with pulmonology Dr. Karna Christmas, we recommend him to get thoracic surgeon evaluation for pleurodesis. Started on diuresis.   Encounter for antineoplastic chemotherapy Treatment plan as listed above.   Weight loss Follow up with nutritionist  Recommend nutrition supplements.   Port-A-Cath in place CXR showed malpositioned medi port, with catheter tip in SVC Ok to use medi port if it aspirate and flushes well. Discussed with vascular surgery Dr. Wyn Quaker.    No orders of the defined types were placed in this encounter.  Follow up  3 weeks  lab MD  All questions were answered. The patient knows to call the clinic with any problems, questions or concerns.  [name], MD, PhD [facility] Hematology Oncology 03/05/2023      Oncology History:  AZAVION BROMMER is a  72 y.o.  male with presents for stage IIIA right lung adenocarcinoma, and presumed stage I left lung cancer now with stage IV recurrent lung adenocarcinoma. Former 43 pack year smoking history.   Oncology History  Primary lung adenocarcinoma, right (HCC)  12/13/2017 Initial Diagnosis   cT3 N1 M0 right upper lobe lung adenocarcinoma  August 2019 Finished  concurrent chemoradiation.   03/17/2019 Finished 1 year of durvalumab treatment.   12/13/2017 Cancer Staging   Staging form: Lung, AJCC 8th Edition - Clinical stage from 12/13/2017: Stage IIIA (cT3, cN1, cM0) - Signed by [name], MD on 12/13/2017   12/31/2017 - 02/19/2018 Chemotherapy   Concurrent chemotherapy carboplatin and Taxol. With radiation   03/24/2018 - 03/17/2019 Chemotherapy   LUNG DURVALUMAB Q14D  x 1 year     12/01/2020 Imaging   PET  1. The 1.1 cm in long axis left apical nodule has maximum SUV of 2.2. Given the progression in size over the last year,  the appearance is suspicious for small/low-grade adenocarcinoma.  2. Upper normal sized AP window lymph nodes have maximum SUV just above blood pool, and are nonspecific.   Tumor board discussion 12/29/20  Slow-growing left upper lobe nodule.  CT-guided biopsy and biopsy via bronchoscopy are both of high risk due to underlying emphysema.  Consensus reached upon continue monitoring and repeat CT in 3 months.  If nodule continues to grow, he may benefit from empiric SBRT     03/13/2021 Imaging   CT without contrast showed slightly increase of left upper lobe nodule 12x83mm versus 11 x 8 mm.  Slightly increasing size.  Remains suspicious for bronchogenic neoplasm. Subtle nodularity along the posterior left upper lobe approximately 6 mm is unchanged.  Posttreatment changes distort the right hilum are unchanged.   04/20/2021 - 04/25/2021 Radiation Therapy   Patient received radiation to left upper lobe  nodule for presumed Stage I left upper lobe lung cancer.    07/18/2021 Imaging    CT chest without contrast showed stable posttreatment changes within the right lung.  No findings to suggest recurrent tumor or metastatic disease.  Interval decrease in size of previously characterized irregular nodule in the left upper lobe.    10/18/2021 Imaging    CT chest showed a stable examination of chronic postradiation masslike fibrosis in the right lung. Persistent subsolid nodule in the superior segment of the left lower lobe 1.3 x 1.2 cm.  Nonspecific.  COPD findings, aortic atherosclerosis.  Coronary artery disease.   01/17/2022 Imaging   01/17/2022, CT chest without contrast showed subsolid area in the left lower lobe is similar to prior imaging however with increasing nodularity.  Recommend PET scan for further evaluation Perihilar parenchymal destruction and scarring from prior radiation in the right hilum extending into the right upper and lower lobe unchanged. Left upper lobe parenchymal scarring likely  reflects treatment changes.  Marked pulmonary emphysema in the background.  Aortic atherosclerosis.   02/06/2022 Imaging   PET restaging 1. Focus of increasing nodularity in the subpleural left lower lobe identified as concerning on the recent CT chest shows only minimal FDG uptake on PET imaging today. While this is reassuring and the finding may reflect nfectious/inflammatory etiology, low-grade or well differentiated neoplasm can be poorly FDG avid. Close continued follow-up recommended. 2. Stable perifissural left lower lobe sub solid nodule, also with low level FDG uptake. Continued attention on follow-up recommende    05/17/2022 Imaging   1. Stable perihilar post radiation change and volume loss in the RIGHT hemithorax. 2. Stable mild mediastinal lymphadenopathy. 3. Peripheral nodule in the LEFT lower lobe measures slightly larger but has benign morphology. 4. Stable thickening RIGHT adrenal gland. 5. No new or progressive lung cancer identified   11/16/2022 Imaging   CT chest abdomen pelvis w contrast showed 1. New moderate right pleural effusion with diffuse, nodular,enhancing pleural thickening consistent with pleural metastatic disease. 2. Proximal obstruction of the right upper lobe segmental bronchi with complete atelectasis or consolidation of the right upper lobe, consistent with local malignant recurrence and postobstructive atelectasis of the upper lobe. 3. Interval increase in circumferential soft tissue thickening about the right hilum. Slight interval increase in size in enlarged AP window lymph nodes. 4. Interval enlargement of a subpleural mass of the dependent  superior segment left lower lobe consistent with an enlarging pulmonary metastasis or metachronous primary lung malignancy.5. Emphysema. 6. Coronary artery disease. 7. Prostatomegaly.    11/20/2022 Procedure   Thoracentesis right side Pathology is positive for adenocarcinoma.  Insufficient material for ancillary  medical testing.    12/03/2022 Imaging   PET scan showed 1. Hypermetabolic mass at the RIGHT hilum consistent with bronchogenic carcinoma. 2. Postobstructive collapse of the RIGHT upper lobe. 3. Uniform hypermetabolic pleural thickening in the RIGHT lung consistent with pleural metastasis. 4. Large RIGHT effusion. 5. Hypermetabolic nodule in the superior segment of the LEFT lower lobe consistent with pulmonary metastasis. Synchronous bronchogenic carcinoma versus metastatic lesion. 6. Hypermetabolic bilateral mediastinal adenopathy consistent with nodal metastasis. 7. No evidence of metastatic disease outside the thorax. 8. No skeletal metastasis.      12/17/2022 Procedure   Patient underwent biopsy via bronchoscopy for additional tissue Pathology showed  Right upper lobe lavage-positive for non-small cell carcinoma Right upper lobe ENB assisted biopsy showed adenocarcinoma, acinar and solid patterns. Right upper lobe FNA positive for non-small cell carcinoma.  Tempus NGS/PD-L1 showed KEAP1 missense, TP53 missense, SMARCA4 frameshift, PTPRD copy loss, no EGFR, KRAS, BRAF, ALK, ROS1 RET MET ERBB mutations.  TMB 18.9 m/mb, MSS, PD-L1 <1%    12/24/2022 Cancer Staging   Staging form: Lung, AJCC 8th Edition - Pathologic stage from 12/24/2022: Stage IV (pTX, pNX, pM1) - Signed by [name], MD on 12/24/2022 Stage prefix: Initial diagnosis   01/01/2023 -  Chemotherapy   Patient is on Treatment Plan : LUNG NSCLC Carboplatin + Paclitaxel + Bevacizumab q21d     02/23/2025 Imaging   CT chest w contrast  Right upper lobe collapse with known central right upper lobe/endobronchial mass, poorly visualized.   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 Adjacent right juxta diaphragmatic nodal metastases, improved. Malpositioned right chest port, as above, unchanged. Aortic Atherosclerosis  (ICD10-I70.0) and Emphysema (ICD10-J43.9).     Multiple  liver nodules,  07/11/2020 MRI liver with and without contrast showed stable hypervascular lesions in the liver parenchyma without new or progressive findings.  Lesions remain most suggestive of benign etiology.  PET scan did not show evaluation activities.  proteous mirabilis UTI, s/p a course bactrim, improved symptoms   INTERVAL HISTORY RANDARIUS PIEKARSKI is a 72 y.o. male who has above history reviewed by me presents for follow-up of stage III A non-small cell lung cancer. Appetite is fair. Weight is stable.  + SOB, better for last thoracentesis - [right side on 02/21/23] No nausea vomiting diarrhea.  Appetite is not good   . Review of Systems  Constitutional:  Positive for fatigue and unexpected weight change. Negative for appetite change, chills and fever.  [name]:   Negative for hearing loss and voice change.   Eyes:  Negative for eye problems and icterus.  Respiratory:  Positive for shortness of breath. Negative for chest tightness and cough.   Cardiovascular:  Negative for chest pain and leg swelling.  Gastrointestinal:  Negative for abdominal distention and abdominal pain.  Endocrine: Negative for hot flashes.  Genitourinary:  Negative for difficulty urinating, dysuria and frequency.   Musculoskeletal:  Negative for arthralgias.  Skin:  Negative for itching and rash.  Neurological:  Negative for headaches, light-headedness and numbness.  [name]:  Negative for adenopathy. Does not bruise/bleed easily.  Psychiatric/Behavioral:  Negative for confusion.      MEDICAL HISTORY:  Past Medical History:  Diagnosis Date   Abnormal nuclear stress test    Anxiety    a.) on BZO (lorazepam) PRN   Asthma    Avascular necrosis of femoral head (HCC)    BPH (benign prostatic hyperplasia)    Cataract    a.) s/p extraction on LEFT   Colon adenomas    COPD (chronic obstructive pulmonary disease) (HCC)    Coronary artery  disease 11/11/2014   a.) MPI 11/11/2014: EF 56%, mild inf wall ischemia; b.) LHC 12/16/2014: 20% mRCA - med mgmt   DDD (degenerative disc disease), lumbar    Diverticulosis    DM type 2 (diabetes mellitus, type 2) (HCC)    Duodenitis    Dysphagia    Dyspnea    ED (erectile dysfunction)    a.) on PDE5i (sildenafil) PRN   Elevated PSA    Former smoker    Gastritis    GERD (gastroesophageal reflux disease)    [name]    Hilar mass    History of hiatal hernia    Hyperlipidemia    Hypertension    Hyperuricemia    Hypokalemia    Left ventricular diastolic dysfunction 10/04/2016   a.) TTE 10/04/2016: EF 50%, mild MR/TR/PR, mod AR, G1DD; b.) TTE 10/10/2017: EF 45%, triv PR, mild AR/MR/TR, G1DD; c.) TTE 03/30/2020: EF 50%, mild-mod AR, mild MR/TR/PR; d.) TTE 04/23/2022: EF >55%, mild LVH, mild LAE, triv TR, mild PR, mod AR/MR   Liver lesion    Obesity    Pedal edema    Peripheral polyneuropathy    Peyronie's disease 05/2022   Pleural effusion    Primary lung adenocarcinoma, right (HCC)    a.) stage IIIA (cT3, cN1, cM0) --> s/p concurrent chemoradiation 2019 --> carboplatin + pacilitaxel (12/31/2017 - 02/19/2018) followed by 1 year (03/24/2018 - 03/17/2019) adjuvant anti-PDL-1 mAB immunotherapy (durvalumab)   [name] (peptic ulcer disease)    Renal insufficiency    Umbilical hernia    [name]     SURGICAL HISTORY: Past Surgical History:  Procedure Laterality Date   CARDIAC CATHETERIZATION N/A 12/16/2014   Procedure: Left Heart Cath;  Surgeon: Marcina Millard, MD;  Location: [facility] INVASIVE CV [facility];  Service: Cardiovascular;  Laterality: N/A;   CARDIOVERSION N/A 01/17/2023   Procedure: CARDIOVERSION;  Surgeon: Clotilde Dieter, DO;  Location: [name];  Service: Cardiovascular;  Laterality: N/A;   CATARACT EXTRACTION Left    COLONOSCOPY     COLONOSCOPY WITH PROPOFOL N/A 01/14/2020   Procedure: COLONOSCOPY WITH PROPOFOL;  Surgeon: Toledo, Boykin Nearing,  MD;  Location: [facility] ENDOSCOPY;  Service: Gastroenterology;  Laterality: N/A;   CYST REMOVAL TRUNK     chest and back over time and it was removed   CYSTECTOMY     ENDOBRONCHIAL ULTRASOUND N/A 12/09/2017   Procedure: ENDOBRONCHIAL ULTRASOUND;  Surgeon: Shane Crutch, MD;  Location: [name];  Service: Pulmonary;  Laterality: N/A;   PORTA CATH INSERTION N/A 12/23/2017   Procedure: PORTA CATH INSERTION;  Surgeon: Annice Needy, MD;  Location: [facility];  Service: Cardiovascular;  Laterality: N/A;   PORTA CATH INSERTION N/A 01/08/2023   Procedure: PORTA CATH INSERTION;  Surgeon: Annice Needy, MD;  Location: [facility];  Service: Cardiovascular;  Laterality: N/A;   PORTA CATH REMOVAL N/A 06/04/2022   Procedure: PORTA CATH REMOVAL;  Surgeon: Annice Needy, MD;  Location: [facility];  Service: Cardiovascular;  Laterality: N/A;   TEE WITHOUT CARDIOVERSION N/A 01/17/2023   Procedure: TRANSESOPHAGEAL ECHOCARDIOGRAM (TEE);  Surgeon: Clotilde Dieter, DO;  Location: [name];  Service: Cardiovascular;  Laterality: N/A;   UPPER GI ENDOSCOPY     VIDEO BRONCHOSCOPY WITH ENDOBRONCHIAL ULTRASOUND N/A 12/17/2022   Procedure: VIDEO BRONCHOSCOPY WITH ENDOBRONCHIAL ULTRASOUND;  Surgeon: Vida Rigger, MD;  Location: [name];  Service: Thoracic;  Laterality: N/A;    SOCIAL HISTORY: Social History   Socioeconomic History   Marital status: Divorced    Spouse name: Not on file   Number of children: Not on file   Years of education: Not on file   Highest education level: Not on file  Occupational History   Not on file  Tobacco Use   Smoking status: Former    Current packs/day: 0.00    Average packs/day: 1.5 packs/day for 35.0 years (52.5 ttl pk-yrs)    Types: Cigarettes    Start date: 06/21/1980    Quit date: 06/22/2015    Years since quitting: 7.7    Passive exposure: Past   Smokeless tobacco: Never  Vaping Use   Vaping status: Never Used  Substance and Sexual  Activity   Alcohol use: Not Currently    Comment: weekends   Drug use: No   Sexual activity: Not Currently  Other Topics Concern   Not on file  Social History Narrative   Lives with son [name].   Social Determinants of Health   Financial Resource Strain: Not on file  Food Insecurity: Not on file  Transportation Needs: Not on file  Physical Activity: Not on file  Stress: Not on file  Social Connections: Unknown (11/14/2021)   [facility]   Social Network    Social Network: Not on file  Intimate Partner Violence: Unknown (10/05/2021)   Received from [facility]   HITS    Physically Hurt: Not on file    Insult or Talk Down To: Not on file    Threaten Physical Harm: Not on file    Scream or Curse: Not on file    FAMILY HISTORY: Family History  Problem Relation Age of Onset   Hypertension Mother    Breast cancer Mother    Heart attack Mother    Lung cancer Father    Heart disease Sister    Diabetes Sister    Colon cancer Sister     ALLERGIES:  is allergic to penicillins.  MEDICATIONS:  Current Outpatient Medications  Medication Sig Dispense Refill   acetaminophen (TYLENOL) 500 MG tablet Take 500 mg by mouth as needed.     albuterol (VENTOLIN HFA) 108 (90 Base) MCG/ACT inhaler Inhale 2 puffs into the lungs every 4 (four) hours as needed.     amLODipine (NORVASC) 10 MG tablet Take 10 mg by mouth every morning.     apixaban (ELIQUIS) 5 MG TABS tablet Take by mouth.     ascorbic acid (VITAMIN C) 1000 MG tablet Take 1,000 mg by mouth daily.     aspirin 81 MG tablet Take 81 mg by mouth daily.     carvedilol (COREG) 25 MG tablet Take 25 mg by mouth 2 (two) times daily with a meal.     cetirizine (ZYRTEC) 10 MG tablet Take 1 tablet by mouth as needed.     cholecalciferol (VITAMIN D3) 25 MCG (1000 UNIT) tablet Take 1,000 Units by mouth daily.     dexamethasone (DECADRON) 4 MG tablet Take 2 tablets (8mg ) by mouth daily starting the day after carboplatin for 3  days. Take with food 30 tablet 1   fluticasone (FLONASE)  50 MCG/ACT nasal spray Place 1 spray into both nostrils daily.     Fluticasone-Umeclidin-Vilant (TRELEGY ELLIPTA) 200-62.5-25 MCG/ACT AEPB Inhale 1 puff into the lungs every morning.     gabapentin (NEURONTIN) 300 MG capsule Take 300 mg by mouth at bedtime as needed.      ibuprofen (ADVIL,MOTRIN) 600 MG tablet Take 600 mg by mouth every 6 (six) hours as needed.     ipratropium-albuterol (DUONEB) 0.5-2.5 (3) MG/3ML SOLN Take 3 mLs by nebulization every 6 (six) hours as needed.     lidocaine-prilocaine (EMLA) cream Apply to affected area once 30 g 3   lisinopril (PRINIVIL,ZESTRIL) 40 MG tablet Take 40 mg by mouth every morning.     LORazepam (ATIVAN) 0.5 MG tablet Take 1 tablet (0.5 mg total) by mouth every 12 (twelve) hours as needed for anxiety. 30 tablet 0   metoprolol tartrate (LOPRESSOR) 50 MG tablet Take 1 tablet by mouth 2 (two) times daily.     mirabegron ER (MYRBETRIQ) 50 MG TB24 tablet Take 1 tablet (50 mg total) by mouth daily. 30 tablet 11   mupirocin ointment (BACTROBAN) 2 % Apply 1 Application topically daily. 22 g 3   ondansetron (ZOFRAN) 8 MG tablet Take 1 tablet (8 mg total) by mouth every 8 (eight) hours as needed for nausea or vomiting. Start on the third day after carboplatin. 30 tablet 1   phenazopyridine (PYRIDIUM) 200 MG tablet Take 1 tablet (200 mg total) by mouth 3 (three) times daily. 6 tablet 0   potassium chloride (KLOR-CON) 10 MEQ tablet Take 1 tablet (10 mEq total) by mouth daily. (Patient taking differently: Take 10 mEq by mouth every morning.) 90 tablet 1   pravastatin (PRAVACHOL) 20 MG tablet Take 20 mg by mouth at bedtime.     prochlorperazine (COMPAZINE) 10 MG tablet Take 1 tablet (10 mg total) by mouth every 6 (six) hours as needed for nausea or vomiting. 90 tablet 3   senna-docusate (SENOKOT-S) 8.6-50 MG tablet Take 2 tablets by mouth daily. 60 tablet 2   sildenafil (VIAGRA) 50 MG tablet Take 1-2 tablets  (50-100 mg total) by mouth daily as needed for erectile dysfunction. 30 tablet 6   spironolactone (ALDACTONE) 25 MG tablet Take by mouth.     sulfamethoxazole-trimethoprim (BACTRIM DS) 800-160 MG tablet Take 1 tablet by mouth 2 (two) times daily. 14 tablet 0   tamsulosin (FLOMAX) 0.4 MG CAPS capsule Take 0.4 mg by mouth daily after breakfast.     torsemide (DEMADEX) 20 MG tablet Take 20 mg by mouth every morning.     No current facility-administered medications for this visit.   Facility-Administered Medications Ordered in Other Visits  Medication Dose Route Frequency Provider Last Rate Last Admin   bevacizumab-awwb (MVASI) 1,300 mg in sodium chloride 0.9 % 100 mL chemo infusion  15 mg/kg (Order-Specific) Intravenous Once [name], MD       CARBOplatin (PARAPLATIN) 590 mg in sodium chloride 0.9 % 250 mL chemo infusion  590 mg Intravenous Once [name], MD       dexamethasone (DECADRON) 10 mg in sodium chloride 0.9 % 50 mL IVPB  10 mg Intravenous Once [name], MD       famotidine (PEPCID) IVPB 20 mg premix  20 mg Intravenous Once [name], MD       fosaprepitant (EMEND) 150 mg in sodium chloride 0.9 % 145 mL IVPB  150 mg Intravenous Once [name], MD       PACLitaxel (TAXOL) 318 mg in  sodium chloride 0.9 % 500 mL chemo infusion (> 80mg /m2)  150 mg/m2 (Order-Specific) Intravenous Once [name], MD       sodium chloride flush (NS) 0.9 % injection 10 mL  10 mL Intravenous PRN [name], MD   10 mL at 03/18/18 0858     PHYSICAL EXAMINATION: ECOG PERFORMANCE STATUS: 0 - Asymptomatic Vitals:   03/05/23 0832  BP: (!) 156/93  Pulse: 79  Resp: 18  Temp: (!) 96 F (35.6 C)  SpO2: 96%   Filed Weights   03/05/23 0832  Weight: 192 lb 6.4 oz (87.3 kg)    Physical Exam Constitutional:      General: He is not in acute distress. [name]:     Head: Normocephalic and atraumatic.  Eyes:     General: No scleral icterus.    Pupils: Pupils are equal, round, and reactive to light.  Cardiovascular:      Rate and Rhythm: Normal rate and regular rhythm.     Heart sounds: Normal heart sounds.  Pulmonary:     Effort: Pulmonary effort is normal. No respiratory distress.     Comments: Decreased breath sound bilaterally Abdominal:     General: Bowel sounds are normal. There is no distension.     Palpations: Abdomen is soft.  Musculoskeletal:        General: No deformity. Normal range of motion.     Cervical back: Normal range of motion and neck supple.  Lymphadenopathy:     Cervical: No cervical adenopathy.  Skin:    General: Skin is warm and dry.     Findings: No rash.     Comments: Left supraclavicular fossa fullness, chronic. Right chest wall medi port with surrounding bruising, small area of skin opening.  no discharge or tenderness.   Neurological:     Mental Status: He is alert and oriented to person, place, and time. Mental status is at baseline.     Cranial Nerves: No cranial nerve deficit.      LABORATORY DATA:  I have reviewed the data as listed    Latest Ref Rng & Units 03/05/2023    7:54 AM 02/12/2023    8:40 AM 01/30/2023    9:19 AM  CBC  WBC 4.0 - 10.5 K/uL 7.2  6.5  2.6   Hemoglobin 13.0 - 17.0 g/dL 62.9  52.8  41.3   Hematocrit 39.0 - 52.0 % 38.1  41.3  39.7   Platelets 150 - 400 K/uL 173  350  70       Latest Ref Rng & Units 03/05/2023    7:53 AM 02/12/2023    8:40 AM 01/22/2023   10:21 AM  CMP  Glucose 70 - 99 mg/dL 244  84  010   BUN 8 - 23 mg/dL 12  13  13    Creatinine 0.61 - 1.24 mg/dL 2.72  5.36  6.44   Sodium 135 - 145 mmol/L 135  136  138   Potassium 3.5 - 5.1 mmol/L 3.4  4.6  4.3   Chloride 98 - 111 mmol/L 93  100  100   CO2 22 - 32 mmol/L 33  29  30   Calcium 8.9 - 10.3 mg/dL 9.0  9.1  8.6   Total Protein 6.5 - 8.1 g/dL 6.8  7.0  6.2   Total Bilirubin 0.3 - 1.2 mg/dL 0.2  0.3  0.4   Alkaline Phos 38 - 126 U/L 52  62  58   AST 15 - 41 U/L  19  21  17    ALT 0 - 44 U/L 14  17  24       RADIOGRAPHIC STUDIES: I have personally reviewed the  radiological images as listed and agreed with the findings in the report.. CT Chest W Contrast  Result Date: 02/24/2023 CLINICAL DATA:  Recurrent non-small cell right lung cancer, on immunotherapy. EXAM: CT CHEST WITH CONTRAST TECHNIQUE: Multidetector CT imaging of the chest was performed during intravenous contrast administration. RADIATION DOSE REDUCTION: This exam was performed according to the departmental dose-optimization program which includes automated exposure control, adjustment of the mA and/or kV according to patient size and/or use of iterative reconstruction technique. CONTRAST:  75mL OMNIPAQUE IOHEXOL 300 MG/ML  SOLN COMPARISON:  CT chest dated 12/14/2022.  PET-CT dated 12/03/2022. FINDINGS: Cardiovascular: Heart is normal in size. Trace anterior pericardial effusion. No evidence of thoracic aortic aneurysm. Very mild atherosclerotic calcifications of the aortic arch. Mild coronary atherosclerosis of the LAD. Right chest port is looped in the neck and terminates in the right brachiocephalic vein just below the thoracic inlet (series 2/image 29), unchanged from priors but malpositioned. Mediastinum/Nodes: No suspicious mediastinal lymphadenopathy. Right juxta diaphragmatic/epicardial nodal soft tissue measuring up to 9 mm short axis (series 2/image 109), previously 18 mm, reflecting improving nodal metastases. Visualized thyroid is unremarkable. 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 This is mildly improved from prior CT. Right upper lobe collapse with known central right upper lobe/endobronchial mass (series 2/images 50 and 54), poorly visualized. Moderate to severe centrilobular and paraseptal emphysematous changes, left upper lobe predominant. 16 mm irregular nodule in the posterior left upper lobe (series 3/image 19), non FDG  avid, favoring pleural-parenchymal scarring. 1.8 x 2.7 cm nodule in the superior segment left lower lobe (series 3/image 87), previously 2.1 x 2.7 cm, mildly improved. This corresponds to the patient's known contralateral pulmonary metastasis. Compressive atelectasis in the right middle and lower lobes. No pneumothorax. Upper Abdomen: Visualized upper abdomen is grossly unremarkable, noting stable subcentimeter nodular thickening of the left adrenal gland, which was non FDG avid on PET. Vascular calcifications. Musculoskeletal: Degenerative changes of the visualized thoracolumbar spine. Stable sclerotic lesions in the T7 vertebral body and posterior elements at T11, non FDG avid on PET, likely benign bone islands. IMPRESSION: Right upper lobe collapse with known central right upper lobe/endobronchial mass, poorly visualized. 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 Adjacent right juxta diaphragmatic nodal metastases, improved. Malpositioned right chest port, as above, unchanged. Aortic Atherosclerosis (ICD10-I70.0) and Emphysema (ICD10-J43.9). Electronically Signed   By: Charline Bills M.D.   On: 02/24/2023 01:12   US THORACENTESIS ASP PLEURAL SPACE W/IMG GUIDE  Result Date: 02/21/2023 INDICATION: Recurrent symptomatic right malignant pleural effusion. EXAM: ULTRASOUND GUIDED RIGHT THORACENTESIS MEDICATIONS: Local 1% lidocaine only. COMPLICATIONS: None immediate. PROCEDURE: An ultrasound guided thoracentesis was thoroughly discussed with the patient and questions answered. The benefits, risks, alternatives and complications were also discussed. The patient understands and wishes to proceed with the procedure. Written consent was obtained. Ultrasound was performed to localize and mark an adequate pocket of fluid in the right chest. The area was then prepped and draped in the normal sterile fashion. 1%  Lidocaine was used for local anesthesia. Under ultrasound guidance a 19 gauge, 7-cm, Yueh catheter was introduced. Thoracentesis was performed. The catheter was removed and a dressing applied. FINDINGS:  A total of approximately 1.8 L of serosanguineous fluid was removed. IMPRESSION: Successful ultrasound guided right thoracentesis yielding 1.8 L of pleural fluid. This exam was performed by Pattricia Boss PA-C, and was supervised and interpreted by Dr. Fredia Sorrow. Electronically Signed   By: Irish Lack M.D.   On: 02/21/2023 15:40   DG Chest Port 1 View  Result Date: 02/21/2023 CLINICAL DATA:  Status post right thoracentesis. EXAM: PORTABLE CHEST 1 VIEW COMPARISON:  February 01, 2023. FINDINGS: No pneumothorax is noted status post right thoracentesis. Small right pleural effusion remains. IMPRESSION: No pneumothorax status post right thoracentesis. Electronically Signed   By: Lupita Raider M.D.   On: 02/21/2023 15:04   US THORACENTESIS ASP PLEURAL SPACE W/IMG GUIDE  Result Date: 02/01/2023 INDICATION: Recurrent right malignant pleural effusion EXAM: ULTRASOUND GUIDED RIGHT THORACENTESIS MEDICATIONS: 6 cc 1% lidocaine COMPLICATIONS: None immediate. PROCEDURE: An ultrasound guided thoracentesis was thoroughly discussed with the patient and questions answered. The benefits, risks, alternatives and complications were also discussed. The patient understands and wishes to proceed with the procedure. Written consent was obtained. Ultrasound was performed to localize and mark an adequate pocket of fluid in the right chest. The area was then prepped and draped in the normal sterile fashion. 1% Lidocaine was used for local anesthesia. Under ultrasound guidance a 6 Fr Safe-T-Centesis catheter was introduced. Thoracentesis was performed. The catheter was removed and a dressing applied. FINDINGS: A total of approximately 2.4 L of serosanguineous fluid was removed. Ordering provider did not request laboratory samples.  IMPRESSION: Successful ultrasound guided right thoracentesis yielding 2.4 L of pleural fluid. Follow-up chest x-ray revealed no evidence of pneumothorax. Procedure performed by Mina Marble, PA-C Electronically Signed   By: Olive Bass M.D.   On: 02/01/2023 14:26   DG Chest Port 1 View  Result Date: 02/01/2023 CLINICAL DATA:  Status post right thoracentesis. EXAM: PORTABLE CHEST 1 VIEW COMPARISON:  January 22, 2023. FINDINGS: Stable cardiomediastinal silhouette. Left lung is clear. Right internal jugular Port-A-Cath is unchanged. No pneumothorax is noted. Right pleural effusion is significantly smaller. IMPRESSION: No pneumothorax status post right-sided thoracentesis. Right pleural effusion is significantly smaller. Electronically Signed   By: Lupita Raider M.D.   On: 02/01/2023 14:13   DG Chest 2 View  Result Date: 01/29/2023 CLINICAL DATA:  Pleural effusion.  History of lung adenocarcinoma. EXAM: CHEST - 2 VIEW COMPARISON:  01/10/2023.  CT, 12/14/2022. FINDINGS: Opacity extends from the right mid hemithorax to the base obscuring the right hemidiaphragm and right heart border, increased compared to the prior exam, consistent with a large pleural effusion and associated atelectasis. Pleural thickening or loculated fluid extends over the right lung apex similar to the prior exam. Left lung is hyperexpanded. Focal nodular opacity in the left mid lung appears smaller than on the prior radiographs. Relative lucency in the upper lobe is consistent with emphysema. There prominent bronchovascular and interstitial markings mostly in the lower lungs. Linear scarring noted at the left apex. These findings are stable. No left pleural effusion. No pneumothorax. Right anterior chest wall, internal jugular Port-A-Cath is stable. Cardiac silhouette is partly obscured, grossly normal in size. No convincing mediastinal or hilar mass or adenopathy. Skeletal structures intact. IMPRESSION: 1. Interval enlargement the right  pleural effusion, now large, associated compressive atelectasis. 2. No evidence of pneumonia or pulmonary edema. 3. Nodular opacity in the left mid lung, corresponding to the superior segment of the left lower lobe based on the prior CT, appears smaller than on the recent  prior chest radiograph. No new nodules and no other change in the appearance of the left lung. Electronically Signed   By: Amie Portland M.D.   On: 01/29/2023 10:25   ECHO TEE  Result Date: 01/17/2023    TRANSESOPHOGEAL ECHO REPORT   Patient Name:   MARKIES OSBEY Date of Exam: 01/17/2023 Medical Rec #:  [id number]         Height:       72.0 in Accession #:    [id number]        Weight:       207.9 lb Date of Birth:  [birth year]         BSA:          2.166 m Patient Age:    72 years          BP:           122/82 mmHg Patient Gender: M                 HR:           77 bpm. Exam Location:  [facility] Procedure: Transesophageal Echo, Cardiac Doppler and Color Doppler Indications:     Not listed on TEE check-in sheet  History:         Patient has no prior history of Echocardiogram examinations.  Sonographer:     Cristela Blue Referring Phys:  [id number] [facility] CUSTOVIC Diagnosing Phys: Rozell Searing Custovic PROCEDURE: The transesophogeal probe was passed without difficulty through the esophogus of the patient. Sedation performed by different physician. The patient developed no complications during the procedure.  IMPRESSIONS  1. Left ventricular ejection fraction, by estimation, is 55 to 60%. The left ventricle has normal function.  2. Right ventricular systolic function was not well visualized. The right ventricular size is not well visualized.  3. No left atrial/left atrial appendage thrombus was detected.  4. The mitral valve was not assessed. No evidence of mitral valve regurgitation.  5. The aortic valve was not assessed. Aortic valve regurgitation is not visualized. Conclusion(s)/Recommendation(s): Normal biventricular function without evidence of  hemodynamically significant valvular heart disease. No LA/LAA thrombus identified. Successful cardioversion performed with restoration of normal sinus rhythm. FINDINGS  Left Ventricle: Left ventricular ejection fraction, by estimation, is 55 to 60%. The left ventricle has normal function. The left ventricular internal cavity size was normal in size. Right Ventricle: The right ventricular size is not well visualized. Right vetricular wall thickness was not assessed. Right ventricular systolic function was not well visualized. Left Atrium: Left atrial size was normal in size. No left atrial/left atrial appendage thrombus was detected. Right Atrium: Right atrial size was not assessed. Pericardium: The pericardium was not assessed. Mitral Valve: The mitral valve was not assessed. No evidence of mitral valve regurgitation. Tricuspid Valve: The tricuspid valve is not assessed. Tricuspid valve regurgitation is not demonstrated. Aortic Valve: The aortic valve was not assessed. Aortic valve regurgitation is not visualized. Pulmonic Valve: The pulmonic valve was not assessed. Pulmonic valve regurgitation is not visualized. Aorta: Aortic root could not be assessed. IAS/Shunts: The interatrial septum was not assessed. Clotilde Dieter Electronically signed by Clotilde Dieter Signature Date/Time: 01/17/2023/1:54:22 PM    Final    DG Chest Port 1 View  Result Date: 01/10/2023 CLINICAL DATA:  Right-sided thoracentesis. EXAM: PORTABLE CHEST 1 VIEW COMPARISON:  One-view chest x-ray 01/09/2023 FINDINGS: The heart is enlarged.  A right IJ Port-A-Cath is stable. The right pleural effusion has decreased. No pneumothorax is present. No significant left-sided  effusion is present. A left perihilar density is again noted. IMPRESSION: 1. Interval decrease in right pleural effusion without pneumothorax. 2. Stable left perihilar density. 3. Cardiomegaly without failure. Electronically Signed   By: Marin Roberts M.D.   On: 01/10/2023  16:25   US THORACENTESIS ASP PLEURAL SPACE W/IMG GUIDE  Result Date: 01/10/2023 INDICATION: Patient with history of right lung cancer and recurrent symptomatic pleural effusion request received for therapeutic thoracentesis. EXAM: ULTRASOUND GUIDED RIGHT THORACENTESIS MEDICATIONS: Local 1% lidocaine only. COMPLICATIONS: None immediate. PROCEDURE: An ultrasound guided thoracentesis was thoroughly discussed with the patient and questions answered. The benefits, risks, alternatives and complications were also discussed. The patient understands and wishes to proceed with the procedure. Written consent was obtained. Ultrasound was performed to localize and mark an adequate pocket of fluid in the right chest. The area was then prepped and draped in the normal sterile fashion. 1% Lidocaine was used for local anesthesia. Under ultrasound guidance a 19 gauge, 7-cm, Yueh catheter was introduced. Thoracentesis was performed. The catheter was removed and a dressing applied. FINDINGS: A total of approximately 2 L of serosanguineous fluid was removed. IMPRESSION: Successful ultrasound guided right thoracentesis yielding 2 L of pleural fluid. This exam was performed by Pattricia Boss PA-C, and was supervised and interpreted by Dr. Juliette Alcide. Electronically Signed   By: Olive Bass M.D.   On: 01/10/2023 16:13   DG Chest 2 View  Result Date: 01/09/2023 CLINICAL DATA:  Shortness of breath. Primary lung adenocarcinoma, rate EXAM: CHEST - 2 VIEW COMPARISON:  Chest radiographs 12/28/2022 and 12/25/2022; CT 12/14/2022 FINDINGS: New right chest wall porta catheter with tip overlying the superior vena cava. The mid aspect of this catheter curls approximately 2.5 cm superiorly at the right neck base before turning inferiorly and extending into the superior vena cava. Interval increase in homogeneous opacity indicating pleural effusion involving the inferior 60% of the right hemithorax compared to 12/28/2022, now more similar to  12/25/2022. There is again homogeneous opacification from right upper lobe collapse. There is again an approximate 2-3 cm faint density overlying the left midlung corresponding to the nodule better seen on prior CT within the superior segment of the left lower lobe on prior CT. No left pleural effusion. No pneumothorax. Visualized left side of the cardiac silhouette and mediastinal contours are unremarkable. Mild multilevel degenerative disc changes of the thoracic spine. IMPRESSION: 1. New right chest wall porta catheter with tip overlying the superior vena cava. The mid aspect of this catheter curls approximately 2.5 cm superiorly at the right neck base, likely into the right internal jugular vein before turning inferiorly and extending into the superior vena cava. 2. Interval increase in homogeneous opacity indicating moderate-to-large pleural effusion involving the inferior 60% of the right hemithorax compared to 12/28/2022, now more similar to 12/25/2022. There also may be right lower lobe collapse, as seen on 12/14/2022 CT. 3. There is again homogeneous opacification of the superomedial right hemithorax from right upper lobe collapse. 4. There is again an approximate 2-3 cm faint density overlying the left midlung corresponding to the nodule better seen on prior CT within the superior segment of the left lower lobe on prior CT. Electronically Signed   By: Neita Garnet M.D.   On: 01/09/2023 12:38   PERIPHERAL VASCULAR CATHETERIZATION  Result Date: 01/08/2023 See surgical note for result.  US THORACENTESIS ASP PLEURAL SPACE W/IMG GUIDE  Result Date: 12/28/2022 INDICATION: Right pleural effusion EXAM: ULTRASOUND GUIDED RIGHT THORACENTESIS MEDICATIONS: None. COMPLICATIONS: None immediate. PROCEDURE: An  ultrasound guided thoracentesis was thoroughly discussed with the patient and questions answered. The benefits, risks, alternatives and complications were also discussed. The patient understands and wishes  to proceed with the procedure. Written consent was obtained. Ultrasound was performed to localize and mark an adequate pocket of fluid in the right chest. The area was then prepped and draped in the normal sterile fashion. 1% Lidocaine was used for local anesthesia. Under ultrasound guidance a 19 gauge, 7-cm, Yueh catheter was introduced. Thoracentesis was performed. The catheter was removed and a dressing applied. FINDINGS: A total of approximately 2.0 L of thin serosanguineous fluid was removed. IMPRESSION: Successful ultrasound guided right thoracentesis yielding 2.0 L of serosanguineous pleural fluid. Electronically Signed   By: Olive Bass M.D.   On: 12/28/2022 15:30   DG Chest 2 View  Result Date: 12/28/2022 CLINICAL DATA:  Right pleural effusion EXAM: CHEST - 2 VIEW COMPARISON:  Previous studies including the examination of 12/18/2022 FINDINGS: There is a interval increase in size of large right pleural effusion. There is opacification of right mid and right lower lung fields. Homogeneous density in the medial right upper lung field corresponds to her case is seen right upper lobe. There is faint 2.1 cm nodular density in left parahilar region corresponding to the lung nodule in left lower lobe seen in previous CT. Scarring is seen in the left apex. IMPRESSION: There is large right pleural effusion with interval increase. Homogeneous opacity in the medial right upper lung field corresponds to atelectasis seen in previous CT. There is 2.1 cm nodular density in the left mid lung field corresponding to a nodular density in left lower lobe seen in previous CT. Electronically Signed   By: Ernie Avena M.D.   On: 12/28/2022 14:49   DG Chest Port 1 View  Result Date: 12/28/2022 CLINICAL DATA:  Right pleural effusion, status post thoracentesis EXAM: PORTABLE CHEST 1 VIEW COMPARISON:  Previous studies including the examination of 12/25/2022 FINDINGS: Transverse diameter of heart is increased. There  is homogeneous opacity in the medial right upper lung field consistent with previously described atelectasis in right upper lobe. There is decreasing large right pleural effusion. There is no pneumothorax. Left lung is clear. Evaluation of right lower lung field is limited by the effusion. IMPRESSION: There is decrease in amount of right pleural effusion after thoracentesis. There is no pneumothorax. Electronically Signed   By: Ernie Avena M.D.   On: 12/28/2022 14:44   DG Chest Port 1 View  Result Date: 12/18/2022 CLINICAL DATA:  Pleural effusion. EXAM: PORTABLE CHEST 1 VIEW COMPARISON:  12/17/2022 FINDINGS: Stable asymmetric elevation right hemidiaphragm. Interval decrease in right pleural effusion. Bibasilar atelectasis again noted. Cardiopericardial silhouette is at upper limits of normal for size. No acute bony abnormality. Soft tissue fullness in the medial right apex is similar to prior, compatible with right upper lobe collapse. IMPRESSION: 1. Interval decrease in right pleural effusion. 2. Persistent right upper lobe collapse. Electronically Signed   By: Kennith Center M.D.   On: 12/18/2022 12:20   US THORACENTESIS ASP PLEURAL SPACE W/IMG GUIDE  Result Date: 12/17/2022 INDICATION: Patient with right lung cancer with recurrent pleural effusion. Interventional radiology asked to perform a therapeutic thoracentesis. EXAM: ULTRASOUND GUIDED THORACENTESIS MEDICATIONS: 1% lidocaine 10 mL COMPLICATIONS: None immediate. PROCEDURE: An ultrasound guided thoracentesis was thoroughly discussed with the patient and questions answered. The benefits, risks, alternatives and complications were also discussed. The patient understands and wishes to proceed with the procedure. Written consent was obtained. Ultrasound  was performed to localize and mark an adequate pocket of fluid in the right chest. The area was then prepped and draped in the normal sterile fashion. 1% Lidocaine was used for local anesthesia.  Under ultrasound guidance a 6 Fr Safe-T-Centesis catheter was introduced. Thoracentesis was performed. The catheter was removed and a dressing applied. FINDINGS: A total of approximately 1.5 L of amber colored fluid was removed. IMPRESSION: Successful ultrasound guided right thoracentesis yielding 1.5 L of pleural fluid. Procedure performed by Alwyn Ren NP and supervised by Dr. Archer Asa. Electronically Signed   By: Malachy Moan M.D.   On: 12/17/2022 20:15   CT SUPER D CHEST WO MONARCH PILOT  Result Date: 12/17/2022 CLINICAL DATA:  Non-small cell lung cancer, ongoing immunotherapy, pleural metastatic disease * Tracking Code: BO * EXAM: CT CHEST WITHOUT CONTRAST TECHNIQUE: Multidetector CT imaging of the chest was performed using thin slice collimation for electromagnetic bronchoscopy planning purposes, without intravenous contrast. RADIATION DOSE REDUCTION: This exam was performed according to the departmental dose-optimization program which includes automated exposure control, adjustment of the mA and/or kV according to patient size and/or use of iterative reconstruction technique. COMPARISON:  Multiple exams, including PET-CT 12/03/2022 FINDINGS: Cardiovascular: Coronary, aortic arch, and branch vessel atherosclerotic vascular disease. Trace pericardial effusion. Mediastinum/Nodes: Mediastinal adenopathy was recently hypermetabolic with representative nodes including a 1.1 cm AP window lymph node on image 58 of series 2 and a 1.6 cm subcarinal lymph node on image 72 series 2. Lungs/Pleura: Very large right pleural effusion with only a small amount of aerated right middle lobe and complete atelectasis of the right lower lobe and right upper lobe. Extensive rind like density along the pleural is irregular and compatible with pleural metastatic disease. There is also nodular infiltration of the pleural adipose tissue at the right anterior lung base adjacent to the pericardium compatible with tumor  infiltration. The patient has a known right hilar and infrahilar mass which is obscured by the surrounding atelectasis. Severe emphysema. A pleural-based nodule in the left lower lobe superior segment measures 2.2 by 2.4 cm on image 79 of series 4 and was hypermetabolic on the 12/03/2022 PET-CT compatible with a contralateral metastatic lesion. Upper Abdomen: No adrenal mass. Scattered colonic diverticula. Abdominal aortic atherosclerosis. Musculoskeletal: Probable hemangioma at the L2 vertebral level. Not previously hypermetabolic. IMPRESSION: 1. Very large right pleural effusion with only a small amount of aerated right middle lobe and complete atelectasis of the right lower lobe and right upper lobe. 2. Extensive rind like density along the pleural is irregular and compatible with pleural metastatic disease. There is also nodular infiltration of the pleural adipose tissue at the right anterior lung base adjacent to the pericardium compatible with tumor infiltration. 3. The patient has a known right hilar and infrahilar mass which is obscured by the surrounding atelectasis. 4. Mediastinal adenopathy was recently hypermetabolic on the 12/03/2022 PET-CT compatible with metastatic disease. 5. A pleural-based nodule in the left lower lobe superior segment measures 2.2 by 2.4 cm and was hypermetabolic on the 12/03/2022 PET-CT compatible with a contralateral metastatic lesion. 6. Severe emphysema. 7. Coronary, aortic arch, and branch vessel atherosclerotic vascular disease. 8. Trace pericardial effusion. 9. Probable hemangioma at the L2 vertebral level. Aortic Atherosclerosis (ICD10-I70.0) and Emphysema (ICD10-J43.9). Electronically Signed   By: Gaylyn Rong M.D.   On: 12/17/2022 18:06   DG Chest Port 1 View  Result Date: 12/17/2022 CLINICAL DATA:  Status post bronchoscopy EXAM: PORTABLE CHEST 1 VIEW COMPARISON:  11/20/2022 FINDINGS: There is only a  miniscule amount of aerated lung in the right mid upper  chest, otherwise the right hemithorax is completely opacified. Indistinct pulmonary vasculature on the left possibly from pulmonary venous hypertension. No blunting of the left costophrenic angle. No visible pneumothorax. IMPRESSION: 1. Near complete opacification of the right hemithorax, with only a miniscule amount of aerated lung in the right mid upper chest. 2. Indistinct pulmonary vasculature on the left possibly from pulmonary venous hypertension. Electronically Signed   By: Gaylyn Rong M.D.   On: 12/17/2022 17:56   DG Chest Port 1 View  Result Date: 12/17/2022 CLINICAL DATA:  Status post thoracentesis EXAM: PORTABLE CHEST 1 VIEW COMPARISON:  Chest x-ray dated June 17th 2020 FINDINGS: Visualized cardiac and mediastinal contours are unchanged. Moderate right pleural effusion, decreased in size when compared with the prior exam. Lung predominant heterogeneous opacities, likely atelectasis. Persistent right upper lobe collapse. No evidence of pneumothorax. IMPRESSION: 1. Moderate right pleural effusion, decreased in size. 2. No evidence of pneumothorax. 3. Persistent right upper lobe collapse. Electronically Signed   By: Allegra Lai M.D.   On: 12/17/2022 16:42   MR Brain W Wo Contrast  Result Date: 12/16/2022 CLINICAL DATA:  Lung cancer, evaluate for metastatic disease EXAM: MRI HEAD WITHOUT AND WITH CONTRAST TECHNIQUE: Multiplanar, multiecho pulse sequences of the brain and surrounding structures were obtained without and with intravenous contrast. CONTRAST:  10mL GADAVIST GADOBUTROL 1 MMOL/ML IV SOLN COMPARISON:  08/25/2019 FINDINGS: Brain: No restricted diffusion to suggest acute or subacute infarct. No abnormal parenchymal or meningeal enhancement. No acute hemorrhage, mass, mass effect, or midline shift. No hydrocephalus or extra-axial collection. Partial empty sella. Craniocervical junction. No hemosiderin deposition to suggest remote hemorrhage. Normal cerebral volume for age. Scattered  T2 hyperintense signal in the periventricular white matter, likely the sequela of mild chronic small vessel ischemic disease. Vascular: Normal arterial flow voids. Skull and upper cervical spine: Normal marrow signal. Sinuses/Orbits: Clear paranasal sinuses. No acute finding in the orbits. Status post left lens replacement. Other: The mastoid air cells are well aerated. IMPRESSION: No acute intracranial process. No evidence of metastatic disease in the brain. Electronically Signed   By: Wiliam Ke M.D.   On: 12/16/2022 03:30

## 2023-03-06 ENCOUNTER — Telehealth: Payer: Self-pay | Admitting: *Deleted

## 2023-03-06 ENCOUNTER — Other Ambulatory Visit: Payer: Self-pay

## 2023-03-06 DIAGNOSIS — J90 Pleural effusion, not elsewhere classified: Secondary | ICD-10-CM

## 2023-03-06 NOTE — Telephone Encounter (Signed)
Patient called and states that he has thought about offer to have procedure done at [facility] yesterday and he would like to proceed with it and needs appointment set up with surgeon for it. He is requesting a return call to further discuss matter.

## 2023-03-06 NOTE — Telephone Encounter (Addendum)
Called pt to clarify. He states he would like to proceed with Pleurx catheter placement.   Request for Pleurx cath faxed to IR.

## 2023-03-07 ENCOUNTER — Encounter: Payer: Self-pay | Admitting: Oncology

## 2023-03-07 ENCOUNTER — Ambulatory Visit: Admission: RE | Admit: 2023-03-07 | Payer: Medicare Other | Source: Ambulatory Visit

## 2023-03-07 NOTE — Progress Notes (Signed)
Gilmer Mor, DO sent to [facility] to schedule right pleural drainage catheter.  Moderate sedation.  Several thora performed, with recurrent non-small cell CA.  [address]

## 2023-03-07 NOTE — Telephone Encounter (Signed)
Called pt to inform him of Pleurx cath placement date. Pt is confused about the pleurx cath and would like to hold off to furhther discuss with Dr. Cathie Hoops. Thoracentesis has been reschedued to 9/6 and he has appt with cariothoracic surgeon on 9/13. Appt details have been mailed.

## 2023-03-08 ENCOUNTER — Ambulatory Visit
Admission: RE | Admit: 2023-03-08 | Discharge: 2023-03-08 | Disposition: A | Discharge status: Home / Self Care | Payer: Medicare Other | Source: Ambulatory Visit | Attending: Pulmonary Disease

## 2023-03-08 ENCOUNTER — Ambulatory Visit
Admission: RE | Admit: 2023-03-08 | Discharge: 2023-03-08 | Disposition: A | Discharge status: Home / Self Care | Payer: Medicare Other | Source: Ambulatory Visit | Attending: Student | Admitting: Student

## 2023-03-08 ENCOUNTER — Other Ambulatory Visit: Payer: Self-pay | Admitting: Student

## 2023-03-08 DIAGNOSIS — G4733 Obstructive sleep apnea (adult) (pediatric): Secondary | ICD-10-CM

## 2023-03-08 DIAGNOSIS — J90 Pleural effusion, not elsewhere classified: Secondary | ICD-10-CM | POA: Insufficient documentation

## 2023-03-08 DIAGNOSIS — Z9889 Other specified postprocedural states: Secondary | ICD-10-CM

## 2023-03-08 DIAGNOSIS — J910 Malignant pleural effusion: Secondary | ICD-10-CM

## 2023-03-08 DIAGNOSIS — J9811 Atelectasis: Secondary | ICD-10-CM | POA: Insufficient documentation

## 2023-03-08 MED ORDER — LIDOCAINE HCL (PF) 1 % IJ SOLN
10.0000 mL | Freq: Once | INTRAMUSCULAR | Status: AC
  Administered 2023-03-08: 10 mL via SUBCUTANEOUS
  Filled 2023-03-08: qty 10

## 2023-03-08 NOTE — Procedures (Signed)
PROCEDURE SUMMARY:  Successful image-guided right thoracentesis. Yielded 1.95 L of serosanguineous fluid. Pt tolerated procedure well. No immediate complications. EBL = trace   Specimen was not sent for labs. CXR ordered.  Please see imaging section of Epic for full dictation.  Kennieth Francois PA-C 03/08/2023 2:44 PM

## 2023-03-11 NOTE — Group Note (Deleted)

## 2023-03-15 ENCOUNTER — Encounter: Payer: Self-pay | Admitting: Oncology

## 2023-03-15 ENCOUNTER — Institutional Professional Consult (permissible substitution): Payer: Medicare Other | Admitting: Thoracic Surgery (Cardiothoracic Vascular Surgery)

## 2023-03-15 VITALS — BP 157/87 | HR 84 | Resp 18 | Ht 72.0 in | Wt 192.0 lb

## 2023-03-15 DIAGNOSIS — C3491 Malignant neoplasm of unspecified part of right bronchus or lung: Secondary | ICD-10-CM | POA: Diagnosis not present

## 2023-03-15 DIAGNOSIS — J90 Pleural effusion, not elsewhere classified: Secondary | ICD-10-CM

## 2023-03-17 NOTE — Progress Notes (Signed)
[address]       [place] [address]             [phone]                    Mark Mccarty [facility] Medical Record #[id number] Date of Birth: 01/22/1951  Referring: [name], MD Primary Care: [name], NP Primary Cardiologist: None  Chief Complaint:    Chief Complaint  Patient presents with   Consult    CT 8/19, PET 6/3    History of Present Illness:    IYAD MCVOY 72 y.o. male presents for surgical evaluation of recurrent right pleural effusion.  He has a hx of stage IV lung cancer.    Past Medical History:  Diagnosis Date   Abnormal nuclear stress test    Anxiety    a.) on BZO (lorazepam) PRN   Asthma    Avascular necrosis of femoral head (HCC)    BPH (benign prostatic hyperplasia)    Cataract    a.) s/p extraction on LEFT   Colon adenomas    COPD (chronic obstructive pulmonary disease) (HCC)    Coronary artery disease 11/11/2014   a.) MPI 11/11/2014: EF 56%, mild inf wall ischemia; b.) LHC 12/16/2014: 20% mRCA - med mgmt   DDD (degenerative disc disease), lumbar    Diverticulosis    DM type 2 (diabetes mellitus, type 2) (HCC)    Duodenitis    Dysphagia    Dyspnea    ED (erectile dysfunction)    a.) on PDE5i (sildenafil) PRN   Elevated PSA    Former smoker    Gastritis    GERD (gastroesophageal reflux disease)    [name]    Hilar mass    History of hiatal hernia    Hyperlipidemia    Hypertension    Hyperuricemia    Hypokalemia    Left ventricular diastolic dysfunction 10/04/2016   a.) TTE 10/04/2016: EF 50%, mild MR/TR/PR, mod AR, G1DD; b.) TTE 10/10/2017: EF 45%, triv PR, mild AR/MR/TR, G1DD; c.) TTE 03/30/2020: EF 50%, mild-mod AR, mild MR/TR/PR; d.) TTE 04/23/2022: EF >55%, mild LVH, mild LAE, triv TR, mild PR, mod AR/MR   Liver lesion    Obesity    Pedal edema    Peripheral polyneuropathy    Peyronie's disease 05/2022   Pleural effusion    Primary lung adenocarcinoma, right  (HCC)    a.) stage IIIA (cT3, cN1, cM0) --> s/p concurrent chemoradiation 2019 --> carboplatin + pacilitaxel (12/31/2017 - 02/19/2018) followed by 1 year (03/24/2018 - 03/17/2019) adjuvant anti-PDL-1 mAB immunotherapy (durvalumab)   [name] (peptic ulcer disease)    Renal insufficiency    Umbilical hernia    [name]     Past Surgical History:  Procedure Laterality Date   CARDIAC CATHETERIZATION N/A 12/16/2014   Procedure: Left Heart Cath;  Surgeon: Marcina Millard, MD;  Location: [facility];  Service: Cardiovascular;  Laterality: N/A;   CARDIOVERSION N/A 01/17/2023   Procedure: CARDIOVERSION;  Surgeon: Clotilde Dieter, DO;  Location: [name];  Service: Cardiovascular;  Laterality: N/A;   CATARACT EXTRACTION Left    COLONOSCOPY     COLONOSCOPY WITH PROPOFOL N/A 01/14/2020   Procedure: COLONOSCOPY WITH PROPOFOL;  Surgeon: Toledo, Boykin Nearing, MD;  Location: [facility];  Service: Gastroenterology;  Laterality: N/A;   CYST REMOVAL TRUNK     chest and back over time and it was removed  CYSTECTOMY     ENDOBRONCHIAL ULTRASOUND N/A 12/09/2017   Procedure: ENDOBRONCHIAL ULTRASOUND;  Surgeon: Shane Crutch, MD;  Location: [name];  Service: Pulmonary;  Laterality: N/A;   PORTA CATH INSERTION N/A 12/23/2017   Procedure: PORTA CATH INSERTION;  Surgeon: Annice Needy, MD;  Location: [facility];  Service: Cardiovascular;  Laterality: N/A;   PORTA CATH INSERTION N/A 01/08/2023   Procedure: PORTA CATH INSERTION;  Surgeon: Annice Needy, MD;  Location: [facility];  Service: Cardiovascular;  Laterality: N/A;   PORTA CATH REMOVAL N/A 06/04/2022   Procedure: PORTA CATH REMOVAL;  Surgeon: Annice Needy, MD;  Location: [facility];  Service: Cardiovascular;  Laterality: N/A;   TEE WITHOUT CARDIOVERSION N/A 01/17/2023   Procedure: TRANSESOPHAGEAL ECHOCARDIOGRAM (TEE);  Surgeon: Clotilde Dieter, DO;  Location: [name];  Service: Cardiovascular;   Laterality: N/A;   UPPER GI ENDOSCOPY     VIDEO BRONCHOSCOPY WITH ENDOBRONCHIAL ULTRASOUND N/A 12/17/2022   Procedure: VIDEO BRONCHOSCOPY WITH ENDOBRONCHIAL ULTRASOUND;  Surgeon: Vida Rigger, MD;  Location: [name];  Service: Thoracic;  Laterality: N/A;    Family History  Problem Relation Age of Onset   Hypertension Mother    Breast cancer Mother    Heart attack Mother    Lung cancer Father    Heart disease Sister    Diabetes Sister    Colon cancer Sister      Social History   Tobacco Use  Smoking Status Former   Current packs/day: 0.00   Average packs/day: 1.5 packs/day for 35.0 years (52.5 ttl pk-yrs)   Types: Cigarettes   Start date: 06/21/1980   Quit date: 06/22/2015   Years since quitting: 7.7   Passive exposure: Past  Smokeless Tobacco Never    Social History   Substance and Sexual Activity  Alcohol Use Not Currently   Comment: weekends     Allergies  Allergen Reactions   Penicillins Rash    Stomach hurt Has patient had a PCN reaction causing immediate rash, facial/tongue/throat swelling, SOB or lightheadedness with hypotension: yes Has patient had a PCN reaction causing severe rash involving mucus membranes or skin necrosis: no Has patient had a PCN reaction that required hospitalization: no Has patient had a PCN reaction occurring within the last 10 years: yes If all of the above answers are "NO", then may proceed with Cephalosporin use.     Current Outpatient Medications  Medication Sig Dispense Refill   acetaminophen (TYLENOL) 500 MG tablet Take 500 mg by mouth as needed.     albuterol (VENTOLIN HFA) 108 (90 Base) MCG/ACT inhaler Inhale 2 puffs into the lungs every 4 (four) hours as needed.     amLODipine (NORVASC) 10 MG tablet Take 10 mg by mouth every morning.     apixaban (ELIQUIS) 5 MG TABS tablet Take by mouth.     ascorbic acid (VITAMIN C) 1000 MG tablet Take 1,000 mg by mouth daily.     aspirin 81 MG tablet Take 81 mg by mouth daily.      carvedilol (COREG) 25 MG tablet Take 25 mg by mouth 2 (two) times daily with a meal.     cetirizine (ZYRTEC) 10 MG tablet Take 1 tablet by mouth as needed.     cholecalciferol (VITAMIN D3) 25 MCG (1000 UNIT) tablet Take 1,000 Units by mouth daily.     dexamethasone (DECADRON) 4 MG tablet Take 2 tablets (8mg ) by mouth daily starting the day after carboplatin for 3 days. Take with  food 30 tablet 1   fluticasone (FLONASE) 50 MCG/ACT nasal spray Place 1 spray into both nostrils daily.     Fluticasone-Umeclidin-Vilant (TRELEGY ELLIPTA) 200-62.5-25 MCG/ACT AEPB Inhale 1 puff into the lungs every morning.     gabapentin (NEURONTIN) 300 MG capsule Take 300 mg by mouth at bedtime as needed.      ibuprofen (ADVIL,MOTRIN) 600 MG tablet Take 600 mg by mouth every 6 (six) hours as needed.     ipratropium-albuterol (DUONEB) 0.5-2.5 (3) MG/3ML SOLN Take 3 mLs by nebulization every 6 (six) hours as needed.     lidocaine-prilocaine (EMLA) cream Apply to affected area once 30 g 3   lisinopril (PRINIVIL,ZESTRIL) 40 MG tablet Take 40 mg by mouth every morning.     LORazepam (ATIVAN) 0.5 MG tablet Take 1 tablet (0.5 mg total) by mouth every 12 (twelve) hours as needed for anxiety. 30 tablet 0   metoprolol tartrate (LOPRESSOR) 50 MG tablet Take 1 tablet by mouth 2 (two) times daily.     mirabegron ER (MYRBETRIQ) 50 MG TB24 tablet Take 1 tablet (50 mg total) by mouth daily. 30 tablet 11   mupirocin ointment (BACTROBAN) 2 % Apply 1 Application topically daily. 22 g 3   ondansetron (ZOFRAN) 8 MG tablet Take 1 tablet (8 mg total) by mouth every 8 (eight) hours as needed for nausea or vomiting. Start on the third day after carboplatin. 30 tablet 1   phenazopyridine (PYRIDIUM) 200 MG tablet Take 1 tablet (200 mg total) by mouth 3 (three) times daily. 6 tablet 0   potassium chloride (KLOR-CON) 10 MEQ tablet Take 1 tablet (10 mEq total) by mouth daily. (Patient taking differently: Take 10 mEq by mouth every morning.) 90 tablet 1    pravastatin (PRAVACHOL) 20 MG tablet Take 20 mg by mouth at bedtime.     prochlorperazine (COMPAZINE) 10 MG tablet Take 1 tablet (10 mg total) by mouth every 6 (six) hours as needed for nausea or vomiting. 90 tablet 3   senna-docusate (SENOKOT-S) 8.6-50 MG tablet Take 2 tablets by mouth daily. 60 tablet 2   sildenafil (VIAGRA) 50 MG tablet Take 1-2 tablets (50-100 mg total) by mouth daily as needed for erectile dysfunction. 30 tablet 6   spironolactone (ALDACTONE) 25 MG tablet Take by mouth.     sulfamethoxazole-trimethoprim (BACTRIM DS) 800-160 MG tablet Take 1 tablet by mouth 2 (two) times daily. 14 tablet 0   tamsulosin (FLOMAX) 0.4 MG CAPS capsule Take 0.4 mg by mouth daily after breakfast.     torsemide (DEMADEX) 20 MG tablet Take 20 mg by mouth every morning.     No current facility-administered medications for this visit.   Facility-Administered Medications Ordered in Other Visits  Medication Dose Route Frequency Provider Last Rate Last Admin   sodium chloride flush (NS) 0.9 % injection 10 mL  10 mL Intravenous PRN [name], MD   10 mL at 03/18/18 0858    Review of Systems  Constitutional:  Positive for malaise/fatigue.  Respiratory:  Positive for cough and shortness of breath.     PHYSICAL EXAMINATION: BP (!) 157/87 (BP Location: Left Arm, Patient Position: Sitting)   Pulse 84   Resp 18   Ht 6' (1.829 m)   Wt 192 lb (87.1 kg)   SpO2 93% Comment: RA  BMI 26.04 kg/m   Physical Exam Constitutional:      Appearance: He is ill-appearing.  Cardiovascular:     Rate and Rhythm: Normal rate.  Pulmonary:  Effort: No respiratory distress.  Abdominal:     General: There is no distension.  Skin:    General: Skin is warm and dry.  Neurological:     General: No focal deficit present.     Mental Status: He is alert and oriented to person, place, and time.      Diagnostic Studies & Laboratory data:     Recent Radiology Findings:   US THORACENTESIS ASP PLEURAL SPACE  W/IMG GUIDE  Result Date: 03/08/2023 INDICATION: Recurrent right pleural effusion. Request received for therapeutic thoracentesis EXAM: ULTRASOUND GUIDED RIGHT THORACENTESIS MEDICATIONS: 7 cc 1% lidocaine COMPLICATIONS: None immediate. PROCEDURE: An ultrasound guided thoracentesis was thoroughly discussed with the patient and questions answered. The benefits, risks, alternatives and complications were also discussed. The patient understands and wishes to proceed with the procedure. Written consent was obtained. Ultrasound was performed to localize and mark an adequate pocket of fluid in the right chest. The area was then prepped and draped in the normal sterile fashion. 1% Lidocaine was used for local anesthesia. Under ultrasound guidance a 6 Fr Safe-T-Centesis catheter was introduced. Thoracentesis was performed. The catheter was removed and a dressing applied. FINDINGS: A total of approximately 1.95 L of serosanguineous fluid was removed. Ordering provider did not request laboratory samples. IMPRESSION: Successful ultrasound guided right thoracentesis yielding 1.95 L of pleural fluid. Follow-up chest x-ray revealed no evidence of pneumothorax. Procedure performed by Mina Marble, PA-C Electronically Signed   By: Olive Bass M.D.   On: 03/08/2023 15:17   DG Chest Port 1 View  Result Date: 03/08/2023 CLINICAL DATA:  post right thoracentesis EXAM: PORTABLE CHEST 1 VIEW COMPARISON:  Multiple priors FINDINGS: Stable appearance of the cardiomediastinal silhouette with chronic atelectasis of the right upper lobe. Interval improvement in right effusion with residual small to moderate fluid. No pneumothorax. Left lung remains clear. Similar malpositioning of the right-sided chest port with redundant catheter in the right neck and tip of the catheter terminating in the proximal SVC. IMPRESSION: 1. No findings of pneumothorax following right-sided thoracentesis, with small to moderate residual effusion remaining. 2.  Similar malpositioning of the right-sided port with redundant catheter in the right neck and tip of the catheter terminating in the proximal SVC. 3. Other chronic findings as detailed. Electronically Signed   By: Olive Bass M.D.   On: 03/08/2023 15:12   CT Chest W Contrast  Result Date: 02/24/2023 CLINICAL DATA:  Recurrent non-small cell right lung cancer, on immunotherapy. EXAM: CT CHEST WITH CONTRAST TECHNIQUE: Multidetector CT imaging of the chest was performed during intravenous contrast administration. RADIATION DOSE REDUCTION: This exam was performed according to the departmental dose-optimization program which includes automated exposure control, adjustment of the mA and/or kV according to patient size and/or use of iterative reconstruction technique. CONTRAST:  75mL OMNIPAQUE IOHEXOL 300 MG/ML  SOLN COMPARISON:  CT chest dated 12/14/2022.  PET-CT dated 12/03/2022. FINDINGS: Cardiovascular: Heart is normal in size. Trace anterior pericardial effusion. No evidence of thoracic aortic aneurysm. Very mild atherosclerotic calcifications of the aortic arch. Mild coronary atherosclerosis of the LAD. Right chest port is looped in the neck and terminates in the right brachiocephalic vein just below the thoracic inlet (series 2/image 29), unchanged from priors but malpositioned. Mediastinum/Nodes: No suspicious mediastinal lymphadenopathy. Right juxta diaphragmatic/epicardial nodal soft tissue measuring up to 9 mm short axis (series 2/image 109), previously 18 mm, reflecting improving nodal metastases. Visualized thyroid is unremarkable. Lungs/Pleura: Moderate to large right pleural effusion, with associated mild posterior pleural thickening and medial  pleural-based nodularity, corresponding to the patient's known malignant pleural disease. This is mildly improved from prior CT. Right upper lobe collapse with known central right upper lobe/endobronchial mass (series 2/images 50 and 54), poorly visualized.  Moderate to severe centrilobular and paraseptal emphysematous changes, left upper lobe predominant. 16 mm irregular nodule in the posterior left upper lobe (series 3/image 19), non FDG avid, favoring pleural-parenchymal scarring. 1.8 x 2.7 cm nodule in the superior segment left lower lobe (series 3/image 87), previously 2.1 x 2.7 cm, mildly improved. This corresponds to the patient's known contralateral pulmonary metastasis. Compressive atelectasis in the right middle and lower lobes. No pneumothorax. Upper Abdomen: Visualized upper abdomen is grossly unremarkable, noting stable subcentimeter nodular thickening of the left adrenal gland, which was non FDG avid on PET. Vascular calcifications. Musculoskeletal: Degenerative changes of the visualized thoracolumbar spine. Stable sclerotic lesions in the T7 vertebral body and posterior elements at T11, non FDG avid on PET, likely benign bone islands. IMPRESSION: Right upper lobe collapse with known central right upper lobe/endobronchial mass, poorly visualized. 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 Adjacent right juxta diaphragmatic nodal metastases, improved. Malpositioned right chest port, as above, unchanged. Aortic Atherosclerosis (ICD10-I70.0) and Emphysema (ICD10-J43.9). Electronically Signed   By: Charline Bills M.D.   On: 02/24/2023 01:12   US THORACENTESIS ASP PLEURAL SPACE W/IMG GUIDE  Result Date: 02/21/2023 INDICATION: Recurrent symptomatic right malignant pleural effusion. EXAM: ULTRASOUND GUIDED RIGHT THORACENTESIS MEDICATIONS: Local 1% lidocaine only. COMPLICATIONS: None immediate. PROCEDURE: An ultrasound guided thoracentesis was thoroughly discussed with the patient and questions answered. The benefits, risks, alternatives and complications were also discussed. The patient understands and wishes to proceed with the procedure. Written consent  was obtained. Ultrasound was performed to localize and mark an adequate pocket of fluid in the right chest. The area was then prepped and draped in the normal sterile fashion. 1% Lidocaine was used for local anesthesia. Under ultrasound guidance a 19 gauge, 7-cm, Yueh catheter was introduced. Thoracentesis was performed. The catheter was removed and a dressing applied. FINDINGS: A total of approximately 1.8 L of serosanguineous fluid was removed. IMPRESSION: Successful ultrasound guided right thoracentesis yielding 1.8 L of pleural fluid. This exam was performed by Pattricia Boss PA-C, and was supervised and interpreted by Dr. Fredia Sorrow. Electronically Signed   By: Irish Lack M.D.   On: 02/21/2023 15:40   DG Chest Port 1 View  Result Date: 02/21/2023 CLINICAL DATA:  Status post right thoracentesis. EXAM: PORTABLE CHEST 1 VIEW COMPARISON:  February 01, 2023. FINDINGS: No pneumothorax is noted status post right thoracentesis. Small right pleural effusion remains. IMPRESSION: No pneumothorax status post right thoracentesis. Electronically Signed   By: Lupita Raider M.D.   On: 02/21/2023 15:04       I have independently reviewed the above radiology studies  and reviewed the findings with the patient.   Recent Lab Findings: Lab Results  Component Value Date   WBC 7.2 03/05/2023   HGB 12.0 (L) 03/05/2023   HCT 38.1 (L) 03/05/2023   PLT 173 03/05/2023   GLUCOSE 145 (H) 03/05/2023   ALT 14 03/05/2023   AST 19 03/05/2023   NA 135 03/05/2023   K 3.4 (L) 03/05/2023   CL 93 (L) 03/05/2023   CREATININE 0.76 03/05/2023   BUN 12 03/05/2023   CO2 33 (H) 03/05/2023   TSH 1.720 03/16/2021   INR 1.2 12/18/2022  Assessment / Plan:   72yo male with Stage IV lung cancer, and recurrent right effusion.  He has undergone thoracentesis on multiple occasions.  I personally reviewed his imaging and think that his lung is entrapped secondary to the radiation.  Given that possibility, performing a  pleurodesis would be unsuccessful since we will not be able to achieve pleural apposition.  I have recommended that he undergo pleurx catheter placement.  I will differ plans for this to his pulmonologist.        [facility]iezer [facility] 03/17/2023 10:51 AM

## 2023-03-18 ENCOUNTER — Other Ambulatory Visit: Payer: Self-pay

## 2023-03-18 ENCOUNTER — Inpatient Hospital Stay: Payer: Medicare Other

## 2023-03-18 ENCOUNTER — Encounter: Payer: Self-pay | Admitting: Hospice and Palliative Medicine

## 2023-03-18 ENCOUNTER — Other Ambulatory Visit: Payer: Self-pay | Admitting: Oncology

## 2023-03-18 ENCOUNTER — Telehealth: Payer: Self-pay | Admitting: *Deleted

## 2023-03-18 ENCOUNTER — Other Ambulatory Visit: Payer: Self-pay | Admitting: *Deleted

## 2023-03-18 ENCOUNTER — Inpatient Hospital Stay (HOSPITAL_BASED_OUTPATIENT_CLINIC_OR_DEPARTMENT_OTHER): Payer: Medicare Other | Admitting: Hospice and Palliative Medicine

## 2023-03-18 VITALS — BP 159/84 | HR 76 | Temp 96.2°F | Resp 22 | Ht 72.0 in | Wt 192.4 lb

## 2023-03-18 DIAGNOSIS — C3491 Malignant neoplasm of unspecified part of right bronchus or lung: Secondary | ICD-10-CM | POA: Diagnosis not present

## 2023-03-18 DIAGNOSIS — R040 Epistaxis: Secondary | ICD-10-CM | POA: Diagnosis not present

## 2023-03-18 DIAGNOSIS — D702 Other drug-induced agranulocytosis: Secondary | ICD-10-CM

## 2023-03-18 DIAGNOSIS — Z7901 Long term (current) use of anticoagulants: Secondary | ICD-10-CM

## 2023-03-18 DIAGNOSIS — Z5112 Encounter for antineoplastic immunotherapy: Secondary | ICD-10-CM | POA: Diagnosis not present

## 2023-03-18 LAB — CMP (CANCER CENTER ONLY)
ALT: 14 U/L (ref 0–44)
AST: 22 U/L (ref 15–41)
Albumin: 3.2 g/dL — ABNORMAL LOW (ref 3.5–5.0)
Alkaline Phosphatase: 51 U/L (ref 38–126)
Anion gap: 10 (ref 5–15)
BUN: 9 mg/dL (ref 8–23)
CO2: 31 mmol/L (ref 22–32)
Calcium: 9.3 mg/dL (ref 8.9–10.3)
Chloride: 94 mmol/L — ABNORMAL LOW (ref 98–111)
Creatinine: 0.67 mg/dL (ref 0.61–1.24)
GFR, Estimated: >60 mL/min (ref >60)
Glucose, Bld: 118 mg/dL — ABNORMAL HIGH (ref 70–99)
Potassium: 3.7 mmol/L (ref 3.5–5.1)
Sodium: 135 mmol/L (ref 135–145)
Total Bilirubin: 0.4 mg/dL (ref 0.3–1.2)
Total Protein: 7.1 g/dL (ref 6.5–8.1)

## 2023-03-18 LAB — CBC WITH DIFFERENTIAL (CANCER CENTER ONLY)
Abs Immature Granulocytes: 0 10*3/uL (ref 0.00–0.07)
Basophils Absolute: 0 10*3/uL (ref 0.0–0.1)
Basophils Relative: 0 %
Eosinophils Absolute: 0 10*3/uL (ref 0.0–0.5)
Eosinophils Relative: 1 %
HCT: 35.8 % — ABNORMAL LOW (ref 39.0–52.0)
Hemoglobin: 11.5 g/dL — ABNORMAL LOW (ref 13.0–17.0)
Immature Granulocytes: 0 %
Lymphocytes Relative: 31 %
Lymphs Abs: 0.6 10*3/uL — ABNORMAL LOW (ref 0.7–4.0)
MCH: 28.3 pg (ref 26.0–34.0)
MCHC: 32.1 g/dL (ref 30.0–36.0)
MCV: 88 fL (ref 80.0–100.0)
Monocytes Absolute: 0.4 10*3/uL (ref 0.1–1.0)
Monocytes Relative: 20 %
Neutro Abs: 0.8 10*3/uL — ABNORMAL LOW (ref 1.7–7.7)
Neutrophils Relative %: 48 %
Platelet Count: 118 10*3/uL — ABNORMAL LOW (ref 150–400)
RBC: 4.07 MIL/uL — ABNORMAL LOW (ref 4.22–5.81)
RDW: 16.9 % — ABNORMAL HIGH (ref 11.5–15.5)
WBC Count: 1.8 10*3/uL — ABNORMAL LOW (ref 4.0–10.5)
nRBC: 0 % (ref 0.0–0.2)

## 2023-03-18 LAB — PROTIME-INR
INR: 1.2 (ref 0.8–1.2)
Prothrombin Time: 15.7 s — ABNORMAL HIGH (ref 11.4–15.2)

## 2023-03-18 LAB — APTT: aPTT: 36 s (ref 24–36)

## 2023-03-18 MED ORDER — FILGRASTIM-SNDZ 480 MCG/0.8ML IJ SOSY
480.0000 ug | PREFILLED_SYRINGE | Freq: Once | INTRAMUSCULAR | Status: AC
  Administered 2023-03-18: 480 ug via SUBCUTANEOUS
  Filled 2023-03-18: qty 0.8

## 2023-03-18 NOTE — Progress Notes (Signed)
Patient c/o multiple episodes of nosebleeds.   Patient having hypertension today. patient spoke with cardiology team this am. He was advised already on medicaiton changes- see charting from [facility] cardiology. He was instructed by myself to continue monitoring bp readings and f/u with cardiologist re: hypertension.

## 2023-03-18 NOTE — Progress Notes (Signed)
[facility] [phone] [phone]  Patient Care Team: [name], NP as PCP - General (Family Medicine) [name], RN as Registered Nurse [name], MD as Consulting Physician (Oncology) [name], MD as Referring Physician (Radiation Oncology) Annice Needy, MD as Referring Physician (Vascular Surgery)   NAME OF PATIENT: Mark Mccarty  [id number]  June 23, 1951   DATE OF VISIT: 03/18/23  REASON FOR CONSULT: Mark Mccarty is a 72 y.o. 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  INTERVAL HISTORY: He last saw Dr. Cathie Hoops on 03/05/2023 and patient received cycle 4 carbo/Taxol/Bev.  Patient has malignant pleural effusion with recommendation for Pleurx the patient has been undecided.  Patient presents [facility] today for evaluation of epistaxis.  Patient reports that he has had intermittent nosebleeds over the past month.  Generally, they are short-lived and stop with pressure.  He has been previously seen in the emergency department for epistaxis.  Patient is on Eliquis for A-fib.  He also wears O2, which is nonhumidified.  Patient denies fever or chills.  Has intermittent nausea with chemotherapy.  Denies GI symptoms presently.  No shortness of breath or chest pain.  No GU symptoms.   Patient offers no further specific complaints today.   PAST MEDICAL HISTORY: Past Medical History:  Diagnosis Date   Abnormal nuclear stress test    Anxiety    a.) on BZO (lorazepam) PRN   Asthma    Avascular necrosis of femoral head (HCC)    BPH (benign prostatic hyperplasia)    Cataract    a.) s/p extraction on LEFT   Colon adenomas    COPD (chronic obstructive pulmonary disease) (HCC)    Coronary artery disease 11/11/2014   a.) MPI 11/11/2014: EF 56%, mild inf wall ischemia; b.) LHC 12/16/2014: 20% mRCA - med mgmt   DDD (degenerative  disc disease), lumbar    Diverticulosis    DM type 2 (diabetes mellitus, type 2) (HCC)    Duodenitis    Dysphagia    Dyspnea    ED (erectile dysfunction)    a.) on PDE5i (sildenafil) PRN   Elevated PSA    Former smoker    Gastritis    GERD (gastroesophageal reflux disease)    [name]    Hilar mass    History of hiatal hernia    Hyperlipidemia    Hypertension    Hyperuricemia    Hypokalemia    Left ventricular diastolic dysfunction 10/04/2016   a.) TTE 10/04/2016: EF 50%, mild MR/TR/PR, mod AR, G1DD; b.) TTE 10/10/2017: EF 45%, triv PR, mild AR/MR/TR, G1DD; c.) TTE 03/30/2020: EF 50%, mild-mod AR, mild MR/TR/PR; d.) TTE 04/23/2022: EF >55%, mild LVH, mild LAE, triv TR, mild PR, mod AR/MR   Liver lesion    Obesity    Pedal edema    Peripheral polyneuropathy    Peyronie's disease 05/2022   Pleural effusion    Primary lung adenocarcinoma, right (HCC)    a.) stage IIIA (cT3, cN1, cM0) --> s/p concurrent chemoradiation 2019 --> carboplatin + pacilitaxel (12/31/2017 - 02/19/2018) followed by 1 year (03/24/2018 - 03/17/2019) adjuvant anti-PDL-1 mAB immunotherapy (durvalumab)   [name] (peptic ulcer disease)    Renal insufficiency    Umbilical hernia    [name]     PAST SURGICAL HISTORY:  Past Surgical History:  Procedure Laterality Date   CARDIAC CATHETERIZATION N/A 12/16/2014   Procedure:  Left Heart Cath;  Surgeon: Marcina Millard, MD;  Location: [facility] INVASIVE CV LAB;  Service: Cardiovascular;  Laterality: N/A;   CARDIOVERSION N/A 01/17/2023   Procedure: CARDIOVERSION;  Surgeon: Clotilde Dieter, DO;  Location: [name];  Service: Cardiovascular;  Laterality: N/A;   CATARACT EXTRACTION Left    COLONOSCOPY     COLONOSCOPY WITH PROPOFOL N/A 01/14/2020   Procedure: COLONOSCOPY WITH PROPOFOL;  Surgeon: Toledo, Boykin Nearing, MD;  Location: [facility] ENDOSCOPY;  Service: Gastroenterology;  Laterality: N/A;   CYST REMOVAL TRUNK     chest and back over time  and it was removed   CYSTECTOMY     ENDOBRONCHIAL ULTRASOUND N/A 12/09/2017   Procedure: ENDOBRONCHIAL ULTRASOUND;  Surgeon: Shane Crutch, MD;  Location: [name];  Service: Pulmonary;  Laterality: N/A;   PORTA CATH INSERTION N/A 12/23/2017   Procedure: PORTA CATH INSERTION;  Surgeon: Annice Needy, MD;  Location: [facility] INVASIVE CV LAB;  Service: Cardiovascular;  Laterality: N/A;   PORTA CATH INSERTION N/A 01/08/2023   Procedure: PORTA CATH INSERTION;  Surgeon: Annice Needy, MD;  Location: [facility] INVASIVE CV LAB;  Service: Cardiovascular;  Laterality: N/A;   PORTA CATH REMOVAL N/A 06/04/2022   Procedure: PORTA CATH REMOVAL;  Surgeon: Annice Needy, MD;  Location: [facility] INVASIVE CV LAB;  Service: Cardiovascular;  Laterality: N/A;   TEE WITHOUT CARDIOVERSION N/A 01/17/2023   Procedure: TRANSESOPHAGEAL ECHOCARDIOGRAM (TEE);  Surgeon: Clotilde Dieter, DO;  Location: [name];  Service: Cardiovascular;  Laterality: N/A;   UPPER GI ENDOSCOPY     VIDEO BRONCHOSCOPY WITH ENDOBRONCHIAL ULTRASOUND N/A 12/17/2022   Procedure: VIDEO BRONCHOSCOPY WITH ENDOBRONCHIAL ULTRASOUND;  Surgeon: Vida Rigger, MD;  Location: [name];  Service: Thoracic;  Laterality: N/A;    HEMATOLOGY/ONCOLOGY HISTORY:  Oncology History  Primary lung adenocarcinoma, right (HCC)  12/13/2017 Initial Diagnosis   cT3 N1 M0 right upper lobe lung adenocarcinoma  August 2019 Finished  concurrent chemoradiation.   03/17/2019 Finished 1 year of durvalumab treatment.   12/13/2017 Cancer Staging   Staging form: Lung, AJCC 8th Edition - Clinical stage from 12/13/2017: Stage IIIA (cT3, cN1, cM0) - Signed by [name], MD on 12/13/2017   12/31/2017 - 02/19/2018 Chemotherapy   Concurrent chemotherapy carboplatin and Taxol. With radiation   03/24/2018 - 03/17/2019 Chemotherapy   LUNG DURVALUMAB Q14D x 1 year     12/01/2020 Imaging   PET  1. The 1.1 cm in long axis left apical nodule has maximum SUV of 2.2. Given the progression in size over  the last year, the appearance is suspicious for small/low-grade adenocarcinoma.  2. Upper normal sized AP window lymph nodes have maximum SUV just above blood pool, and are nonspecific.   Tumor board discussion 12/29/20  Slow-growing left upper lobe nodule.  CT-guided biopsy and biopsy via bronchoscopy are both of high risk due to underlying emphysema.  Consensus reached upon continue monitoring and repeat CT in 3 months.  If nodule continues to grow, he may benefit from empiric SBRT     03/13/2021 Imaging   CT without contrast showed slightly increase of left upper lobe nodule 12x32mm versus 11 x 8 mm.  Slightly increasing size.  Remains suspicious for bronchogenic neoplasm. Subtle nodularity along the posterior left upper lobe approximately 6 mm is unchanged.  Posttreatment changes distort the right hilum are unchanged.   04/20/2021 - 04/25/2021 Radiation Therapy   Patient received radiation to left upper lobe  nodule for presumed Stage I left upper lobe lung cancer.    07/18/2021 Imaging  CT chest without contrast showed stable posttreatment changes within the right lung.  No findings to suggest recurrent tumor or metastatic disease.  Interval decrease in size of previously characterized irregular nodule in the left upper lobe.    10/18/2021 Imaging    CT chest showed a stable examination of chronic postradiation masslike fibrosis in the right lung. Persistent subsolid nodule in the superior segment of the left lower lobe 1.3 x 1.2 cm.  Nonspecific.  COPD findings, aortic atherosclerosis.  Coronary artery disease.   01/17/2022 Imaging   01/17/2022, CT chest without contrast showed subsolid area in the left lower lobe is similar to prior imaging however with increasing nodularity.  Recommend PET scan for further evaluation Perihilar parenchymal destruction and scarring from prior radiation in the right hilum extending into the right upper and lower lobe unchanged. Left upper lobe parenchymal  scarring likely reflects treatment changes.  Marked pulmonary emphysema in the background.  Aortic atherosclerosis.   02/06/2022 Imaging   PET restaging 1. Focus of increasing nodularity in the subpleural left lower lobe identified as concerning on the recent CT chest shows only minimal FDG uptake on PET imaging today. While this is reassuring and the finding may reflect nfectious/inflammatory etiology, low-grade or well differentiated neoplasm can be poorly FDG avid. Close continued follow-up recommended. 2. Stable perifissural left lower lobe sub solid nodule, also with low level FDG uptake. Continued attention on follow-up recommende    05/17/2022 Imaging   1. Stable perihilar post radiation change and volume loss in the RIGHT hemithorax. 2. Stable mild mediastinal lymphadenopathy. 3. Peripheral nodule in the LEFT lower lobe measures slightly larger but has benign morphology. 4. Stable thickening RIGHT adrenal gland. 5. No new or progressive lung cancer identified   11/16/2022 Imaging   CT chest abdomen pelvis w contrast showed 1. New moderate right pleural effusion with diffuse, nodular,enhancing pleural thickening consistent with pleural metastatic disease. 2. Proximal obstruction of the right upper lobe segmental bronchi with complete atelectasis or consolidation of the right upper lobe, consistent with local malignant recurrence and postobstructive atelectasis of the upper lobe. 3. Interval increase in circumferential soft tissue thickening about the right hilum. Slight interval increase in size in enlarged AP window lymph nodes. 4. Interval enlargement of a subpleural mass of the dependent superior segment left lower lobe consistent with an enlarging pulmonary metastasis or metachronous primary lung malignancy.5. Emphysema. 6. Coronary artery disease. 7. Prostatomegaly.    11/20/2022 Procedure   Thoracentesis right side Pathology is positive for adenocarcinoma.  Insufficient  material for ancillary medical testing.    12/03/2022 Imaging   PET scan showed 1. Hypermetabolic mass at the RIGHT hilum consistent with bronchogenic carcinoma. 2. Postobstructive collapse of the RIGHT upper lobe. 3. Uniform hypermetabolic pleural thickening in the RIGHT lung consistent with pleural metastasis. 4. Large RIGHT effusion. 5. Hypermetabolic nodule in the superior segment of the LEFT lower lobe consistent with pulmonary metastasis. Synchronous bronchogenic carcinoma versus metastatic lesion. 6. Hypermetabolic bilateral mediastinal adenopathy consistent with nodal metastasis. 7. No evidence of metastatic disease outside the thorax. 8. No skeletal metastasis.      12/17/2022 Procedure   Patient underwent biopsy via bronchoscopy for additional tissue Pathology showed  Right upper lobe lavage-positive for non-small cell carcinoma Right upper lobe ENB assisted biopsy showed adenocarcinoma, acinar and solid patterns. Right upper lobe FNA positive for non-small cell carcinoma.  [facility] NGS/PD-L1 showed KEAP1 missense, TP53 missense, SMARCA4 frameshift, PTPRD copy loss, no EGFR, KRAS, BRAF, ALK, ROS1 RET MET ERBB mutations.  TMB 18.9 m/mb, MSS, PD-L1 <1%    12/24/2022 Cancer Staging   Staging form: Lung, AJCC 8th Edition - Pathologic stage from 12/24/2022: Stage IV (pTX, pNX, pM1) - Signed by [name], MD on 12/24/2022 Stage prefix: Initial diagnosis   01/01/2023 -  Chemotherapy   Patient is on Treatment Plan : LUNG NSCLC Carboplatin + Paclitaxel + Bevacizumab q21d     02/23/2025 Imaging   CT chest w contrast  Right upper lobe collapse with known central right upper lobe/endobronchial mass, poorly visualized.   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 Adjacent right juxta diaphragmatic nodal metastases, improved. Malpositioned right chest port, as above, unchanged. Aortic  Atherosclerosis (ICD10-I70.0) and Emphysema (ICD10-J43.9).     ALLERGIES:  is allergic to penicillins.  MEDICATIONS:  Current Outpatient Medications  Medication Sig Dispense Refill   acetaminophen (TYLENOL) 500 MG tablet Take 500 mg by mouth as needed.     albuterol (VENTOLIN HFA) 108 (90 Base) MCG/ACT inhaler Inhale 2 puffs into the lungs every 4 (four) hours as needed.     amLODipine (NORVASC) 10 MG tablet Take 10 mg by mouth every morning.     apixaban (ELIQUIS) 5 MG TABS tablet Take by mouth.     ascorbic acid (VITAMIN C) 1000 MG tablet Take 1,000 mg by mouth daily.     aspirin 81 MG tablet Take 81 mg by mouth daily.     carvedilol (COREG) 25 MG tablet Take 25 mg by mouth 2 (two) times daily with a meal.     cetirizine (ZYRTEC) 10 MG tablet Take 1 tablet by mouth as needed.     cholecalciferol (VITAMIN D3) 25 MCG (1000 UNIT) tablet Take 1,000 Units by mouth daily.     dexamethasone (DECADRON) 4 MG tablet Take 2 tablets (8mg ) by mouth daily starting the day after carboplatin for 3 days. Take with food 30 tablet 1   fluticasone (FLONASE) 50 MCG/ACT nasal spray Place 1 spray into both nostrils daily.     Fluticasone-Umeclidin-Vilant (TRELEGY ELLIPTA) 200-62.5-25 MCG/ACT AEPB Inhale 1 puff into the lungs every morning.     gabapentin (NEURONTIN) 300 MG capsule Take 300 mg by mouth at bedtime as needed.      ibuprofen (ADVIL,MOTRIN) 600 MG tablet Take 600 mg by mouth every 6 (six) hours as needed.     ipratropium-albuterol (DUONEB) 0.5-2.5 (3) MG/3ML SOLN Take 3 mLs by nebulization every 6 (six) hours as needed.     lidocaine-prilocaine (EMLA) cream Apply to affected area once 30 g 3   lisinopril (PRINIVIL,ZESTRIL) 40 MG tablet Take 40 mg by mouth every morning.     LORazepam (ATIVAN) 0.5 MG tablet Take 1 tablet (0.5 mg total) by mouth every 12 (twelve) hours as needed for anxiety. 30 tablet 0   metoprolol tartrate (LOPRESSOR) 50 MG tablet Take 1 tablet by mouth 2 (two) times daily.      mirabegron ER (MYRBETRIQ) 50 MG TB24 tablet Take 1 tablet (50 mg total) by mouth daily. 30 tablet 11   mupirocin ointment (BACTROBAN) 2 % Apply 1 Application topically daily. 22 g 3   ondansetron (ZOFRAN) 8 MG tablet Take 1 tablet (8 mg total) by mouth every 8 (eight) hours as needed for nausea or vomiting. Start on the third day after carboplatin. 30 tablet 1   phenazopyridine (PYRIDIUM) 200 MG tablet Take 1 tablet (200 mg total) by mouth 3 (three) times daily. 6 tablet 0  potassium chloride (KLOR-CON) 10 MEQ tablet Take 1 tablet (10 mEq total) by mouth daily. (Patient taking differently: Take 10 mEq by mouth every morning.) 90 tablet 1   pravastatin (PRAVACHOL) 20 MG tablet Take 20 mg by mouth at bedtime.     prochlorperazine (COMPAZINE) 10 MG tablet Take 1 tablet (10 mg total) by mouth every 6 (six) hours as needed for nausea or vomiting. 90 tablet 3   senna-docusate (SENOKOT-S) 8.6-50 MG tablet Take 2 tablets by mouth daily. 60 tablet 2   sildenafil (VIAGRA) 50 MG tablet Take 1-2 tablets (50-100 mg total) by mouth daily as needed for erectile dysfunction. 30 tablet 6   spironolactone (ALDACTONE) 25 MG tablet Take by mouth.     sulfamethoxazole-trimethoprim (BACTRIM DS) 800-160 MG tablet Take 1 tablet by mouth 2 (two) times daily. 14 tablet 0   tamsulosin (FLOMAX) 0.4 MG CAPS capsule Take 0.4 mg by mouth daily after breakfast.     torsemide (DEMADEX) 20 MG tablet Take 20 mg by mouth every morning.     No current facility-administered medications for this visit.   Facility-Administered Medications Ordered in Other Visits  Medication Dose Route Frequency Provider Last Rate Last Admin   sodium chloride flush (NS) 0.9 % injection 10 mL  10 mL Intravenous PRN [name], MD   10 mL at 03/18/18 0858    VITAL SIGNS: There were no vitals taken for this visit. There were no vitals filed for this visit.  Estimated body mass index is 26.04 kg/m as calculated from the following:   Height as of  03/15/23: 6' (1.829 m).   Weight as of 03/15/23: 192 lb (87.1 kg).  LABS: CBC:    Component Value Date/Time   WBC 7.2 03/05/2023 0754   WBC 8.3 12/17/2022 2136   HGB 12.0 (L) 03/05/2023 0754   HGB 15.8 07/10/2014 1328   HCT 38.1 (L) 03/05/2023 0754   HCT 47.2 07/10/2014 1328   PLT 173 03/05/2023 0754   PLT 236 07/10/2014 1328   MCV 88.4 03/05/2023 0754   MCV 93 07/10/2014 1328   NEUTROABS 5.2 03/05/2023 0754   NEUTROABS 7.0 (H) 07/10/2014 1328   LYMPHSABS 0.8 03/05/2023 0754   LYMPHSABS 2.0 07/10/2014 1328   MONOABS 1.1 (H) 03/05/2023 0754   MONOABS 0.8 07/10/2014 1328   EOSABS 0.0 03/05/2023 0754   EOSABS 0.1 07/10/2014 1328   BASOSABS 0.1 03/05/2023 0754   BASOSABS 0.1 07/10/2014 1328   Comprehensive Metabolic Panel:    Component Value Date/Time   NA 135 03/05/2023 0753   NA 138 07/10/2014 1328   K 3.4 (L) 03/05/2023 0753   K 3.1 (L) 07/10/2014 1328   CL 93 (L) 03/05/2023 0753   CL 101 07/10/2014 1328   CO2 33 (H) 03/05/2023 0753   CO2 31 07/10/2014 1328   BUN 12 03/05/2023 0753   BUN 14 07/10/2014 1328   CREATININE 0.76 03/05/2023 0753   CREATININE 1.16 07/10/2014 1328   GLUCOSE 145 (H) 03/05/2023 0753   GLUCOSE 164 (H) 07/10/2014 1328   CALCIUM 9.0 03/05/2023 0753   CALCIUM 8.9 07/10/2014 1328   AST 19 03/05/2023 0753   ALT 14 03/05/2023 0753   ALT 19 07/10/2014 1328   ALKPHOS 52 03/05/2023 0753   ALKPHOS 60 07/10/2014 1328   BILITOT 0.2 (L) 03/05/2023 0753   PROT 6.8 03/05/2023 0753   PROT 7.4 07/10/2014 1328   ALBUMIN 3.0 (L) 03/05/2023 0753   ALBUMIN 3.4 07/10/2014 1328    RADIOGRAPHIC STUDIES: US THORACENTESIS  ASP PLEURAL SPACE W/IMG GUIDE  Result Date: 03/08/2023 INDICATION: Recurrent right pleural effusion. Request received for therapeutic thoracentesis EXAM: ULTRASOUND GUIDED RIGHT THORACENTESIS MEDICATIONS: 7 cc 1% lidocaine COMPLICATIONS: None immediate. PROCEDURE: An ultrasound guided thoracentesis was thoroughly discussed with the patient and  questions answered. The benefits, risks, alternatives and complications were also discussed. The patient understands and wishes to proceed with the procedure. Written consent was obtained. Ultrasound was performed to localize and mark an adequate pocket of fluid in the right chest. The area was then prepped and draped in the normal sterile fashion. 1% Lidocaine was used for local anesthesia. Under ultrasound guidance a 6 Fr Safe-T-Centesis catheter was introduced. Thoracentesis was performed. The catheter was removed and a dressing applied. FINDINGS: A total of approximately 1.95 L of serosanguineous fluid was removed. Ordering provider did not request laboratory samples. IMPRESSION: Successful ultrasound guided right thoracentesis yielding 1.95 L of pleural fluid. Follow-up chest x-ray revealed no evidence of pneumothorax. Procedure performed by Mina Marble, PA-C Electronically Signed   By: Olive Bass M.D.   On: 03/08/2023 15:17   DG Chest Port 1 View  Result Date: 03/08/2023 CLINICAL DATA:  post right thoracentesis EXAM: PORTABLE CHEST 1 VIEW COMPARISON:  Multiple priors FINDINGS: Stable appearance of the cardiomediastinal silhouette with chronic atelectasis of the right upper lobe. Interval improvement in right effusion with residual small to moderate fluid. No pneumothorax. Left lung remains clear. Similar malpositioning of the right-sided chest port with redundant catheter in the right neck and tip of the catheter terminating in the proximal SVC. IMPRESSION: 1. No findings of pneumothorax following right-sided thoracentesis, with small to moderate residual effusion remaining. 2. Similar malpositioning of the right-sided port with redundant catheter in the right neck and tip of the catheter terminating in the proximal SVC. 3. Other chronic findings as detailed. Electronically Signed   By: Olive Bass M.D.   On: 03/08/2023 15:12   CT Chest W Contrast  Result Date: 02/24/2023 CLINICAL DATA:   Recurrent non-small cell right lung cancer, on immunotherapy. EXAM: CT CHEST WITH CONTRAST TECHNIQUE: Multidetector CT imaging of the chest was performed during intravenous contrast administration. RADIATION DOSE REDUCTION: This exam was performed according to the departmental dose-optimization program which includes automated exposure control, adjustment of the mA and/or kV according to patient size and/or use of iterative reconstruction technique. CONTRAST:  75mL OMNIPAQUE IOHEXOL 300 MG/ML  SOLN COMPARISON:  CT chest dated 12/14/2022.  PET-CT dated 12/03/2022. FINDINGS: Cardiovascular: Heart is normal in size. Trace anterior pericardial effusion. No evidence of thoracic aortic aneurysm. Very mild atherosclerotic calcifications of the aortic arch. Mild coronary atherosclerosis of the LAD. Right chest port is looped in the neck and terminates in the right brachiocephalic vein just below the thoracic inlet (series 2/image 29), unchanged from priors but malpositioned. Mediastinum/Nodes: No suspicious mediastinal lymphadenopathy. Right juxta diaphragmatic/epicardial nodal soft tissue measuring up to 9 mm short axis (series 2/image 109), previously 18 mm, reflecting improving nodal metastases. Visualized thyroid is unremarkable. 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 This is mildly improved from prior CT. Right upper lobe collapse with known central right upper lobe/endobronchial mass (series 2/images 50 and 54), poorly visualized. Moderate to severe centrilobular and paraseptal emphysematous changes, left upper lobe predominant. 16 mm irregular nodule in the posterior left upper lobe (series 3/image 19), non FDG avid, favoring pleural-parenchymal scarring. 1.8 x 2.7 cm nodule in the superior  segment left lower lobe (series 3/image 87), previously 2.1 x  2.7 cm, mildly improved. This corresponds to the patient's known contralateral pulmonary metastasis. Compressive atelectasis in the right middle and lower lobes. No pneumothorax. Upper Abdomen: Visualized upper abdomen is grossly unremarkable, noting stable subcentimeter nodular thickening of the left adrenal gland, which was non FDG avid on PET. Vascular calcifications. Musculoskeletal: Degenerative changes of the visualized thoracolumbar spine. Stable sclerotic lesions in the T7 vertebral body and posterior elements at T11, non FDG avid on PET, likely benign bone islands. IMPRESSION: Right upper lobe collapse with known central right upper lobe/endobronchial mass, poorly visualized. 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 Adjacent right juxta diaphragmatic nodal metastases, improved. Malpositioned right chest port, as above, unchanged. Aortic Atherosclerosis (ICD10-I70.0) and Emphysema (ICD10-J43.9). Electronically Signed   By: Charline Bills M.D.   On: 02/24/2023 01:12   US THORACENTESIS ASP PLEURAL SPACE W/IMG GUIDE  Result Date: 02/21/2023 INDICATION: Recurrent symptomatic right malignant pleural effusion. EXAM: ULTRASOUND GUIDED RIGHT THORACENTESIS MEDICATIONS: Local 1% lidocaine only. COMPLICATIONS: None immediate. PROCEDURE: An ultrasound guided thoracentesis was thoroughly discussed with the patient and questions answered. The benefits, risks, alternatives and complications were also discussed. The patient understands and wishes to proceed with the procedure. Written consent was obtained. Ultrasound was performed to localize and mark an adequate pocket of fluid in the right chest. The area was then prepped and draped in the normal sterile fashion. 1% Lidocaine was used for local anesthesia. Under ultrasound guidance a 19 gauge, 7-cm, Yueh catheter was introduced. Thoracentesis was performed. The  catheter was removed and a dressing applied. FINDINGS: A total of approximately 1.8 L of serosanguineous fluid was removed. IMPRESSION: Successful ultrasound guided right thoracentesis yielding 1.8 L of pleural fluid. This exam was performed by Pattricia Boss PA-C, and was supervised and interpreted by Dr. Fredia Sorrow. Electronically Signed   By: Irish Lack M.D.   On: 02/21/2023 15:40   DG Chest Port 1 View  Result Date: 02/21/2023 CLINICAL DATA:  Status post right thoracentesis. EXAM: PORTABLE CHEST 1 VIEW COMPARISON:  February 01, 2023. FINDINGS: No pneumothorax is noted status post right thoracentesis. Small right pleural effusion remains. IMPRESSION: No pneumothorax status post right thoracentesis. Electronically Signed   By: Lupita Raider M.D.   On: 02/21/2023 15:04    PERFORMANCE STATUS (ECOG) : 1 - Symptomatic but completely ambulatory  Review of Systems Unless otherwise noted, a complete review of systems is negative.  Physical Exam General: NAD Cardiovascular: regular rate and rhythm Pulmonary: clear ant fields Abdomen: soft, nontender, + bowel sounds GU: no suprapubic tenderness Extremities: no edema, no joint deformities Skin: no rashes Neurological: Weakness but otherwise nonfocal  IMPRESSION/PLAN: Stage IV lung cancer -on treatment with carbo/Taxol/Bev.  Epistaxis -patient mildly thrombocytopenic but platelets not low enough to anticipate spontaneous bleeding.  Patient is on Eliquis and would not recommend holding this due to clotting risk from A-fib.  Most likely, epistaxis is due to drying out of his nasal passages from nonhumidified O2.  I recommended that he switch to humidified oxygen and he will contact his oxygen supplier to request this.  Discussed with Dr. Cathie Hoops who also recommended ENT evaluation.  Will send referral.  Discussed ED triggers with patient.  Neutropenia -secondary to chemotherapy.  Discussed neutropenic precautions with patient in detail.  Dr. Cathie Hoops is  ordering G-CSF x 3.  RTC daily x 2 days for GCSF.  Case and plan  discussed with Dr. Cathie Hoops   Patient expressed understanding and was in agreement with this plan. He also understands that He can call clinic at any time with any questions, concerns, or complaints.   Thank you for allowing me to participate in the care of this very pleasant patient.   Time Total: 20 minutes  Visit consisted of counseling and education dealing with the complex and emotionally intense issues of symptom management in the setting of serious illness.Greater than 50%  of this time was spent counseling and coordinating care related to the above assessment and plan.  Signed by: Laurette Schimke, PhD, NP-C

## 2023-03-18 NOTE — Telephone Encounter (Signed)
Rn sent order to [employer] via [employer] for Oxygen humidification equipment.

## 2023-03-18 NOTE — Telephone Encounter (Signed)
Pt will need a lab encounter today as well.

## 2023-03-18 NOTE — Patient Instructions (Signed)
Please start taking over the counter claritin daily for 5 days to prevent side effects from the filgrastim injection.

## 2023-03-19 ENCOUNTER — Telehealth: Payer: Self-pay | Admitting: *Deleted

## 2023-03-19 ENCOUNTER — Other Ambulatory Visit: Payer: Self-pay | Admitting: Oncology

## 2023-03-19 ENCOUNTER — Inpatient Hospital Stay: Payer: Medicare Other

## 2023-03-19 ENCOUNTER — Encounter: Payer: Self-pay | Admitting: Oncology

## 2023-03-19 DIAGNOSIS — Z5112 Encounter for antineoplastic immunotherapy: Secondary | ICD-10-CM | POA: Diagnosis not present

## 2023-03-19 DIAGNOSIS — C3491 Malignant neoplasm of unspecified part of right bronchus or lung: Secondary | ICD-10-CM

## 2023-03-19 DIAGNOSIS — D702 Other drug-induced agranulocytosis: Secondary | ICD-10-CM

## 2023-03-19 MED ORDER — FILGRASTIM-SNDZ 480 MCG/0.8ML IJ SOSY
480.0000 ug | PREFILLED_SYRINGE | Freq: Once | INTRAMUSCULAR | Status: AC
  Administered 2023-03-19: 480 ug via SUBCUTANEOUS
  Filled 2023-03-19: qty 0.8

## 2023-03-19 NOTE — Telephone Encounter (Signed)
ENT referral faxed. 03/19/23- fax confirmation [email or web address].

## 2023-03-20 ENCOUNTER — Telehealth: Payer: Self-pay

## 2023-03-20 ENCOUNTER — Inpatient Hospital Stay: Payer: Medicare Other

## 2023-03-20 DIAGNOSIS — Z5112 Encounter for antineoplastic immunotherapy: Secondary | ICD-10-CM | POA: Diagnosis not present

## 2023-03-20 DIAGNOSIS — C3491 Malignant neoplasm of unspecified part of right bronchus or lung: Secondary | ICD-10-CM

## 2023-03-20 DIAGNOSIS — D702 Other drug-induced agranulocytosis: Secondary | ICD-10-CM

## 2023-03-20 MED ORDER — FILGRASTIM-SNDZ 480 MCG/0.8ML IJ SOSY
480.0000 ug | PREFILLED_SYRINGE | Freq: Once | INTRAMUSCULAR | Status: AC
  Administered 2023-03-20: 480 ug via SUBCUTANEOUS
  Filled 2023-03-20: qty 0.8

## 2023-03-20 NOTE — Progress Notes (Signed)
Patient for [name] on Thurs 03/21/2023, [employer] called and spoke with the patient on the phone and gave pre-procedure instructions. Mark Mccarty made pt aware to be here at 12:30p, NPO for 8 hours prior to procedure as well as Armed forces training and education officer. Lovette Cliche stated that Pt stated that he understood.  Called 03/20/2023

## 2023-03-20 NOTE — Telephone Encounter (Signed)
Pt accepted appt or Pleurx cath placement:   9/19 @ 1:30p arrive 12:30p  [facility]. NPO 8 hours prior to procedure and will need driver.   Pt verbalized understanding and be there tomorrow.   Will fax Physician written order to [facility].

## 2023-03-21 ENCOUNTER — Other Ambulatory Visit: Payer: Self-pay

## 2023-03-21 ENCOUNTER — Encounter: Payer: Self-pay | Admitting: Oncology

## 2023-03-21 ENCOUNTER — Encounter: Payer: Self-pay | Admitting: Radiology

## 2023-03-21 ENCOUNTER — Other Ambulatory Visit: Payer: Self-pay | Admitting: Student

## 2023-03-21 ENCOUNTER — Ambulatory Visit
Admission: RE | Admit: 2023-03-21 | Discharge: 2023-03-21 | Disposition: A | Discharge status: Home / Self Care | Payer: Medicare Other | Source: Ambulatory Visit | Attending: Oncology | Admitting: Oncology

## 2023-03-21 DIAGNOSIS — Z87891 Personal history of nicotine dependence: Secondary | ICD-10-CM | POA: Diagnosis not present

## 2023-03-21 DIAGNOSIS — I10 Essential (primary) hypertension: Secondary | ICD-10-CM | POA: Diagnosis not present

## 2023-03-21 DIAGNOSIS — Z01812 Encounter for preprocedural laboratory examination: Secondary | ICD-10-CM

## 2023-03-21 DIAGNOSIS — J90 Pleural effusion, not elsewhere classified: Secondary | ICD-10-CM | POA: Insufficient documentation

## 2023-03-21 DIAGNOSIS — F419 Anxiety disorder, unspecified: Secondary | ICD-10-CM | POA: Diagnosis not present

## 2023-03-21 DIAGNOSIS — J4489 Other specified chronic obstructive pulmonary disease: Secondary | ICD-10-CM | POA: Insufficient documentation

## 2023-03-21 DIAGNOSIS — C3491 Malignant neoplasm of unspecified part of right bronchus or lung: Secondary | ICD-10-CM | POA: Diagnosis not present

## 2023-03-21 DIAGNOSIS — E119 Type 2 diabetes mellitus without complications: Secondary | ICD-10-CM | POA: Diagnosis not present

## 2023-03-21 HISTORY — PX: IR GUIDED DRAIN W CATHETER PLACEMENT: IMG719

## 2023-03-21 MED ORDER — FENTANYL CITRATE (PF) 100 MCG/2ML IJ SOLN
INTRAMUSCULAR | Status: AC
  Filled 2023-03-21: qty 2

## 2023-03-21 MED ORDER — MIDAZOLAM HCL 2 MG/2ML IJ SOLN
INTRAMUSCULAR | Status: AC
  Filled 2023-03-21: qty 2

## 2023-03-21 MED ORDER — LIDOCAINE 1 % OPTIME INJ - NO CHARGE
13.0000 mL | Freq: Once | INTRAMUSCULAR | Status: AC
  Administered 2023-03-21: 13 mL via INTRADERMAL
  Filled 2023-03-21: qty 14

## 2023-03-21 MED ORDER — SODIUM CHLORIDE 0.9 % IV SOLN: INTRAVENOUS | Status: DC

## 2023-03-21 MED ORDER — FENTANYL CITRATE (PF) 100 MCG/2ML IJ SOLN
INTRAMUSCULAR | Status: AC | PRN
  Administered 2023-03-21 (×2): 25 ug via INTRAVENOUS

## 2023-03-21 MED ORDER — VANCOMYCIN HCL IN DEXTROSE 1-5 GM/200ML-% IV SOLN
1000.0000 mg | INTRAVENOUS | Status: AC
  Administered 2023-03-21: 1000 mg via INTRAVENOUS

## 2023-03-21 MED ORDER — HEPARIN SOD (PORK) LOCK FLUSH 100 UNIT/ML IV SOLN
INTRAVENOUS | Status: AC
  Filled 2023-03-21: qty 5

## 2023-03-21 MED ORDER — MIDAZOLAM HCL 2 MG/2ML IJ SOLN
INTRAMUSCULAR | Status: AC | PRN
Start: 2023-03-21 — End: 2023-03-21
  Administered 2023-03-21 (×2): .5 mg via INTRAVENOUS

## 2023-03-21 MED ORDER — VANCOMYCIN HCL IN DEXTROSE 1-5 GM/200ML-% IV SOLN
INTRAVENOUS | Status: AC
  Filled 2023-03-21: qty 200

## 2023-03-21 NOTE — H&P (Signed)
Chief Complaint: Patient was seen in consultation today for recurrent right pleural effusion  Referring Physician(s): Yu,Zhou  Supervising Physician: Gilmer Mor  Patient Status: [facility] - Out-pt  History of Present Illness: Mark Mccarty is a 72 y.o. male with complex PMH including anxiety, asthma, avascular necrosis of femoral head, COPD, type 2 diabetes mellitus, hypertension, lung cancer, and renal insufficiency. Patient is under the care of Dr Cathie Hoops for his lung adenocarcinoma of the right lung. Patient has had associated malignant right pleural effusions for which he has been receiving outpatient thoracentesis procedures. Dr Cathie Hoops has recommended tunneled PleurX catheter placement and referred patient to IR.  Past Medical History:  Diagnosis Date   Abnormal nuclear stress test    Anxiety    a.) on BZO (lorazepam) PRN   Asthma    Avascular necrosis of femoral head (HCC)    BPH (benign prostatic hyperplasia)    Cataract    a.) s/p extraction on LEFT   Colon adenomas    COPD (chronic obstructive pulmonary disease) (HCC)    Coronary artery disease 11/11/2014   a.) MPI 11/11/2014: EF 56%, mild inf wall ischemia; b.) LHC 12/16/2014: 20% mRCA - med mgmt   DDD (degenerative disc disease), lumbar    Diverticulosis    DM type 2 (diabetes mellitus, type 2) (HCC)    Duodenitis    Dysphagia    Dyspnea    ED (erectile dysfunction)    a.) on PDE5i (sildenafil) PRN   Elevated PSA    Former smoker    Gastritis    GERD (gastroesophageal reflux disease)    [name]    Hilar mass    History of hiatal hernia    Hyperlipidemia    Hypertension    Hyperuricemia    Hypokalemia    Left ventricular diastolic dysfunction 10/04/2016   a.) TTE 10/04/2016: EF 50%, mild MR/TR/PR, mod AR, G1DD; b.) TTE 10/10/2017: EF 45%, triv PR, mild AR/MR/TR, G1DD; c.) TTE 03/30/2020: EF 50%, mild-mod AR, mild MR/TR/PR; d.) TTE 04/23/2022: EF >55%, mild LVH, mild LAE, triv TR, mild PR,  mod AR/MR   Liver lesion    Obesity    Pedal edema    Peripheral polyneuropathy    Peyronie's disease 05/2022   Pleural effusion    Primary lung adenocarcinoma, right (HCC)    a.) stage IIIA (cT3, cN1, cM0) --> s/p concurrent chemoradiation 2019 --> carboplatin + pacilitaxel (12/31/2017 - 02/19/2018) followed by 1 year (03/24/2018 - 03/17/2019) adjuvant anti-PDL-1 mAB immunotherapy (durvalumab)   [name] (peptic ulcer disease)    Renal insufficiency    Umbilical hernia    [name]     Past Surgical History:  Procedure Laterality Date   CARDIAC CATHETERIZATION N/A 12/16/2014   Procedure: Left Heart Cath;  Surgeon: Marcina Millard, MD;  Location: [facility] INVASIVE CV LAB;  Service: Cardiovascular;  Laterality: N/A;   CARDIOVERSION N/A 01/17/2023   Procedure: CARDIOVERSION;  Surgeon: Clotilde Dieter, DO;  Location: [name];  Service: Cardiovascular;  Laterality: N/A;   CATARACT EXTRACTION Left    COLONOSCOPY     COLONOSCOPY WITH PROPOFOL N/A 01/14/2020   Procedure: COLONOSCOPY WITH PROPOFOL;  Surgeon: Toledo, Boykin Nearing, MD;  Location: [facility] ENDOSCOPY;  Service: Gastroenterology;  Laterality: N/A;   CYST REMOVAL TRUNK     chest and back over time and it was removed   CYSTECTOMY     ENDOBRONCHIAL ULTRASOUND N/A 12/09/2017   Procedure: ENDOBRONCHIAL ULTRASOUND;  Surgeon: Shane Crutch, MD;  Location: [name];  Service: Pulmonary;  Laterality: N/A;   PORTA CATH INSERTION N/A 12/23/2017   Procedure: PORTA CATH INSERTION;  Surgeon: Annice Needy, MD;  Location: [facility] INV[facility]IVE CV LAB;  Service: Cardiovascular;  Laterality: N/A;   PORTA CATH INSERTION N/A 01/08/2023   Procedure: PORTA CATH INSERTION;  Surgeon: Annice Needy, MD;  Location: [facility] INV[facility] LAB;  Service: Cardiovascular;  Laterality: N/A;   PORTA CATH REMOVAL N/A 06/04/2022   Procedure: PORTA CATH REMOVAL;  Surgeon: Annice Needy, MD;  Location: [facility] INVASIVE CV LAB;  Service: Cardiovascular;  Laterality: N/A;    TEE WITHOUT CARDIOVERSION N/A 01/17/2023   Procedure: TRANSESOPHAGEAL ECHOCARDIOGRAM (TEE);  Surgeon: Clotilde Dieter, DO;  Location: [name];  Service: Cardiovascular;  Laterality: N/A;   UPPER GI ENDOSCOPY     VIDEO BRONCHOSCOPY WITH ENDOBRONCHIAL ULTRASOUND N/A 12/17/2022   Procedure: VIDEO BRONCHOSCOPY WITH ENDOBRONCHIAL ULTRASOUND;  Surgeon: Vida Rigger, MD;  Location: [name];  Service: Thoracic;  Laterality: N/A;    Allergies: Penicillins  Medications: Prior to Admission medications   Medication Sig Start Date End Date Taking? Authorizing Provider  acetaminophen (TYLENOL) 500 MG tablet Take 500 mg by mouth as needed.    [provider]  albuterol (VENTOLIN HFA) 108 (90 Base) MCG/ACT inhaler Inhale 2 puffs into the lungs every 4 (four) hours as needed. 11/08/22   [provider]  amLODipine (NORVASC) 10 MG tablet Take 10 mg by mouth every morning. 09/11/17   [provider]  apixaban (ELIQUIS) 5 MG TABS tablet Take 5 mg by mouth 2 (two) times daily. 01/10/23   [provider]  ascorbic acid (VITAMIN C) 1000 MG tablet Take 1,000 mg by mouth daily.    Mickey Farber, MD  aspirin 81 MG tablet Take 81 mg by mouth daily.    [provider]  cetirizine (ZYRTEC) 10 MG tablet Take 1 tablet by mouth as needed. 10/25/22 10/25/23  [provider]  cholecalciferol (VITAMIN D3) 25 MCG (1000 UNIT) tablet Take 1,000 Units by mouth daily.    [provider]  dexamethasone (DECADRON) 4 MG tablet Take 2 tablets (8mg ) by mouth daily starting the day after carboplatin for 3 days. Take with food Patient not taking: Reported on 03/18/2023 12/24/22   [name], MD  fluticasone [facility]) 50 MCG/ACT nasal spray Place 1 spray into both nostrils daily. 01/21/23   [provider]  Fluticasone-Umeclidin-Vilant (TRELEGY ELLIPTA) 200-62.5-25 MCG/ACT AEPB Inhale 1 puff into the lungs every morning.    [provider]  gabapentin (NEURONTIN) 300  MG capsule Take 300 mg by mouth at bedtime as needed.     [provider]  ibuprofen (ADVIL,MOTRIN) 600 MG tablet Take 600 mg by mouth every 6 (six) hours as needed. Patient not taking: Reported on 03/18/2023    [provider]  ipratropium-albuterol (DUONEB) 0.5-2.5 (3) MG/3ML SOLN Take 3 mLs by nebulization every 6 (six) hours as needed.    [provider]  lidocaine-prilocaine (EMLA) cream Apply to affected area once Patient not taking: Reported on 03/18/2023 12/24/22   [name], MD  lisinopril (PRINIVIL,ZESTRIL) 40 MG tablet Take 40 mg by mouth every morning.    [provider]  LORazepam (ATIVAN) 0.5 MG tablet Take 1 tablet (0.5 mg total) by mouth every 12 (twelve) hours as needed for anxiety. 11/21/22   [name], MD  metoprolol tartrate (LOPRESSOR) 50 MG tablet Take 50 mg by mouth 2 (two) times daily.    [provider]  mirabegron ER (MYRBETRIQ) 50  MG TB24 tablet Take 1 tablet (50 mg total) by mouth daily. 02/14/23   Sondra Come, MD  mupirocin ointment (BACTROBAN) 2 % Apply 1 Application topically daily. Patient not taking: Reported on 03/18/2023 01/29/23   Georgiana Spinner, NP  ondansetron (ZOFRAN) 8 MG tablet Take 1 tablet (8 mg total) by mouth every 8 (eight) hours as needed for nausea or vomiting. Start on the third day after carboplatin. 12/24/22   [name], MD  potassium chloride (KLOR-CON) 10 MEQ tablet Take 1 tablet (10 mEq total) by mouth daily. Patient taking differently: Take 10 mEq by mouth every morning. 05/18/22   [name], MD  pravastatin (PRAVACHOL) 20 MG tablet Take 20 mg by mouth at bedtime. 09/15/18   [provider]  prochlorperazine (COMPAZINE) 10 MG tablet Take 1 tablet (10 mg total) by mouth every 6 (six) hours as needed for nausea or vomiting. 02/12/23   [name], MD  senna-docusate (SENOKOT-S) 8.6-50 MG tablet Take 2 tablets by mouth daily. 01/09/23   [name], MD  spironolactone (ALDACTONE) 25 MG tablet Take by mouth.  12/17/22 12/17/23  [provider]  tamsulosin (FLOMAX) 0.4 MG CAPS capsule Take 0.4 mg by mouth daily after breakfast. 03/14/17   [provider]  torsemide (DEMADEX) 20 MG tablet Take 20 mg by mouth every morning.    [provider]     Family History  Problem Relation Age of Onset   Hypertension Mother    Breast cancer Mother    Heart attack Mother    Lung cancer Father    Heart disease Sister    Diabetes Sister    Colon cancer Sister     Social History   Socioeconomic History   Marital status: Divorced    Spouse name: Not on file   Number of children: Not on file   Years of education: Not on file   Highest education level: Not on file  Occupational History   Not on file  Tobacco Use   Smoking status: Former    Current packs/day: 0.00    Average packs/day: 1.5 packs/day for 35.0 years (52.5 ttl pk-yrs)    Types: Cigarettes    Start date: 06/21/1980    Quit date: 06/22/2015    Years since quitting: 7.7    Passive exposure: Past   Smokeless tobacco: Never  Vaping Use   Vaping status: Never Used  Substance and Sexual Activity   Alcohol use: Not Currently    Comment: weekends   Drug use: No   Sexual activity: Not Currently  Other Topics Concern   Not on file  Social History Narrative   Lives with son [name].   Social Determinants of Health   Financial Resource Strain: Not on file  Food Insecurity: Not on file  Transportation Needs: Not on file  Physical Activity: Not on file  Stress: Not on file  Social Connections: Unknown (11/14/2021)   Received from [facility]   Social Network    Social Network: Not on file    Code Status: Full code  Review of Systems: A 12 point ROS discussed and pertinent positives are indicated in the HPI above.  All other systems are negative.  Review of Systems  Constitutional:  Negative for chills and fever.  Respiratory:  Positive for shortness of breath. Negative for chest tightness.    Cardiovascular:  Negative for chest pain and leg swelling.  Gastrointestinal:  Negative for abdominal pain, diarrhea, nausea and vomiting.  Neurological:  Negative for dizziness and headaches.  Psychiatric/Behavioral:  Negative for confusion.     Vital Signs: BP (!) 157/96   Pulse 91   Temp 98.1 F (36.7 C) (Oral)   Resp (!) 22   SpO2 95%    Physical Exam Vitals reviewed.  Constitutional:      General: He is not in acute distress.    Appearance: He is ill-appearing.  Cardiovascular:     Rate and Rhythm: Normal rate and regular rhythm.     Pulses: Normal pulses.     Heart sounds: Normal heart sounds.  Pulmonary:     Effort: Pulmonary effort is normal.     Breath sounds: Wheezing present.     Comments: Diffuse wheezing auscultated in all lung fields and diminished breath sounds on right Abdominal:     Palpations: Abdomen is soft.     Tenderness: There is no abdominal tenderness.  Musculoskeletal:     Right lower leg: No edema.     Left lower leg: No edema.  Skin:    General: Skin is warm and dry.  Neurological:     Mental Status: He is alert.  Psychiatric:        Mood and Affect: Mood normal.        Behavior: Behavior normal.        Thought Content: Thought content normal.        Judgment: Judgment normal.     Imaging: US THORACENTESIS ASP PLEURAL SPACE W/IMG GUIDE  Result Date: 03/08/2023 INDICATION: Recurrent right pleural effusion. Request received for therapeutic thoracentesis EXAM: ULTRASOUND GUIDED RIGHT THORACENTESIS MEDICATIONS: 7 cc 1% lidocaine COMPLICATIONS: None immediate. PROCEDURE: An ultrasound guided thoracentesis was thoroughly discussed with the patient and questions answered. The benefits, risks, alternatives and complications were also discussed. The patient understands and wishes to proceed with the procedure. Written consent was obtained. Ultrasound was performed to localize and mark an adequate pocket of fluid in the right chest. The area was  then prepped and draped in the normal sterile fashion. 1% Lidocaine was used for local anesthesia. Under ultrasound guidance a 6 Fr Safe-T-Centesis catheter was introduced. Thoracentesis was performed. The catheter was removed and a dressing applied. FINDINGS: A total of approximately 1.95 L of serosanguineous fluid was removed. Ordering provider did not request laboratory samples. IMPRESSION: Successful ultrasound guided right thoracentesis yielding 1.95 L of pleural fluid. Follow-up chest x-ray revealed no evidence of pneumothorax. Procedure performed by Mina Marble, PA-C Electronically Signed   By: Olive Bass M.D.   On: 03/08/2023 15:17   DG Chest Port 1 View  Result Date: 03/08/2023 CLINICAL DATA:  post right thoracentesis EXAM: PORTABLE CHEST 1 VIEW COMPARISON:  Multiple priors FINDINGS: Stable appearance of the cardiomediastinal silhouette with chronic atelectasis of the right upper lobe. Interval improvement in right effusion with residual small to moderate fluid. No pneumothorax. Left lung remains clear. Similar malpositioning of the right-sided chest port with redundant catheter in the right neck and tip of the catheter terminating in the proximal SVC. IMPRESSION: 1. No findings of pneumothorax following right-sided thoracentesis, with small to moderate residual effusion remaining. 2. Similar malpositioning of the right-sided port with redundant catheter in the right neck and tip of the catheter terminating in the proximal SVC. 3. Other chronic findings as detailed. Electronically Signed   By: Olive Bass M.D.   On: 03/08/2023 15:12   US THORACENTESIS ASP PLEURAL SPACE W/IMG GUIDE  Result Date: 02/21/2023 INDICATION: Recurrent symptomatic right malignant pleural effusion. EXAM: ULTRASOUND GUIDED  RIGHT THORACENTESIS MEDICATIONS: Local 1% lidocaine only. COMPLICATIONS: None immediate. PROCEDURE: An ultrasound guided thoracentesis was thoroughly discussed with the patient and questions  answered. The benefits, risks, alternatives and complications were also discussed. The patient understands and wishes to proceed with the procedure. Written consent was obtained. Ultrasound was performed to localize and mark an adequate pocket of fluid in the right chest. The area was then prepped and draped in the normal sterile fashion. 1% Lidocaine was used for local anesthesia. Under ultrasound guidance a 19 gauge, 7-cm, Yueh catheter was introduced. Thoracentesis was performed. The catheter was removed and a dressing applied. FINDINGS: A total of approximately 1.8 L of serosanguineous fluid was removed. IMPRESSION: Successful ultrasound guided right thoracentesis yielding 1.8 L of pleural fluid. This exam was performed by Pattricia Boss PA-C, and was supervised and interpreted by Dr. Fredia Sorrow. Electronically Signed   By: Irish Lack M.D.   On: 02/21/2023 15:40   DG Chest Port 1 View  Result Date: 02/21/2023 CLINICAL DATA:  Status post right thoracentesis. EXAM: PORTABLE CHEST 1 VIEW COMPARISON:  February 01, 2023. FINDINGS: No pneumothorax is noted status post right thoracentesis. Small right pleural effusion remains. IMPRESSION: No pneumothorax status post right thoracentesis. Electronically Signed   By: Lupita Raider M.D.   On: 02/21/2023 15:04    Labs:  CBC: Recent Labs    01/30/23 0919 02/12/23 0840 03/05/23 0754 03/18/23 1133  WBC 2.6* 6.5 7.2 1.8*  HGB 13.4 13.2 12.0* 11.5*  HCT 39.7 41.3 38.1* 35.8*  PLT 70* 350 173 118*    COAGS: Recent Labs    12/11/22 1107 12/18/22 0432 03/18/23 1133  INR 1.0 1.2 1.2  APTT 32 31 36    BMP: Recent Labs    01/22/23 1021 02/12/23 0840 03/05/23 0753 03/18/23 1133  NA 138 136 135 135  K 4.3 4.6 3.4* 3.7  CL 100 100 93* 94*  CO2 30 29 33* 31  GLUCOSE 105* 84 145* 118*  BUN 13 13 12 9   CALCIUM 8.6* 9.1 9.0 9.3  CREATININE 0.81 0.91 0.76 0.67  GFRNONAA >60 >60 >60 >60    LIVER FUNCTION TESTS: Recent Labs    01/22/23 1021  02/12/23 0840 03/05/23 0753 03/18/23 1133  BILITOT 0.4 0.3 0.2* 0.4  AST 17 21 19 22   ALT 24 17 14 14   ALKPHOS 58 62 52 51  PROT 6.2* 7.0 6.8 7.1  ALBUMIN 3.1* 3.2* 3.0* 3.2*    TUMOR MARKERS: No results for input(s): "AFPTM", "CEA", "CA199", "CHROMGRNA" in the last 8760 hours.  Assessment and Plan:  Mattix Badia is a 72 yo male known to IR service from multiple thoracentesis procedures. Patient presents today due to recurrent right pleural effusion secondary to lung adenocarcinoma. Patient is under the care of Dr Cathie Hoops who has requested right-sided image-guided tunneled PleurX catheter placement. Case has been reviewed with Dr Loreta Ave and is scheduled to proceed on 03/21/23. Patient is NPO.  Risks and benefits discussed with the patient/ family members including bleeding, infection, damage to adjacent structures, development of infection which can be life threatening, malfunctioning of the catheter which may require repair/replacement.   All questions were answered, there is an agreement to proceed.  Consent signed and in chart.    Thank you for this interesting consult.  I greatly enjoyed meeting ANTONIN LAFITTE and look forward to participating in their care.  A copy of this report was sent to the requesting provider on this date.  Electronically Signed: Kennieth Francois,  PA-C 03/21/2023, 12:30 PM   I spent a total of  15 Minutes in face to face in clinical consultation, greater than 50% of which was counseling/coordinating care for recurrent right pleural effusion

## 2023-03-21 NOTE — Procedures (Signed)
Interventional Radiology Procedure Note  Procedure: Placement of a right tunneled pleural drainage catheter.   Functional and ready for use.  ~1.6L aspirated.   Complications: None  Recommendations:  - Ok to use - Do not submerge - routine wound care - Dc 1 hr home  Signed,  Yvone Neu. Loreta Ave, DO

## 2023-03-22 ENCOUNTER — Telehealth: Payer: Self-pay | Admitting: *Deleted

## 2023-03-22 ENCOUNTER — Encounter: Payer: Self-pay | Admitting: *Deleted

## 2023-03-22 NOTE — Telephone Encounter (Signed)
Patient's son and daughter and patient arrived to cancer center this afternoon for one on one nursing education. RN provided pleurx cath teaching. Patient and family also provided written educational materials/drainage steps/brochure/dvd videos, and handouts. Patient given 2 pleurx bottles to drain at home given the fact that supplies have not come in from [facility]. Patient educated to call [facility] services to confirm shipment of supplies. RN spent 1 hour and half teaching patient and providing hands on demonstration and feedback. Discussed with patient on when to call provider. Pt instructed not to drain anymore than 1,000 ml per day. He was reminded not to get the dressing wet. If he does, he needs to change the dressing and coordinate his baths around dressing changes. Family practice draining procedure on demonstration model. Pt/family engaged in teaching and participation. Teach back process performed.  Patient desired to drain pleurx cath while in the cancer center. Pt drained per hospital policy 500 ml serous fluid drained from pleurx cath. 135/75; hr 78; oxygen sats - 97% on 2Liters of oxygen- home tank (s/p procedure). patient tolerated procedure well. No signs of infection present at pleurx cath site.

## 2023-03-25 MED FILL — Dexamethasone Sodium Phosphate Inj 100 MG/10ML: INTRAMUSCULAR | Qty: 1 | Status: AC

## 2023-03-25 MED FILL — Fosaprepitant Dimeglumine For IV Infusion 150 MG (Base Eq): INTRAVENOUS | Qty: 5 | Status: AC

## 2023-03-25 NOTE — Assessment & Plan Note (Signed)
CXR showed malpositioned medi port, with catheter tip in SVC Ok to use medi port if it aspirate and flushes well. Discussed with vascular surgery Dr. Wyn Quaker.

## 2023-03-26 ENCOUNTER — Inpatient Hospital Stay: Payer: Medicare Other

## 2023-03-26 ENCOUNTER — Inpatient Hospital Stay (HOSPITAL_BASED_OUTPATIENT_CLINIC_OR_DEPARTMENT_OTHER): Payer: Medicare Other | Admitting: Oncology

## 2023-03-26 ENCOUNTER — Encounter: Payer: Self-pay | Admitting: Oncology

## 2023-03-26 ENCOUNTER — Ambulatory Visit: Payer: Medicare Other

## 2023-03-26 VITALS — BP 124/71 | HR 93 | Temp 98.0°F | Resp 18 | Wt 183.3 lb

## 2023-03-26 DIAGNOSIS — C3491 Malignant neoplasm of unspecified part of right bronchus or lung: Secondary | ICD-10-CM

## 2023-03-26 DIAGNOSIS — R634 Abnormal weight loss: Secondary | ICD-10-CM

## 2023-03-26 DIAGNOSIS — J90 Pleural effusion, not elsewhere classified: Secondary | ICD-10-CM | POA: Diagnosis not present

## 2023-03-26 DIAGNOSIS — F411 Generalized anxiety disorder: Secondary | ICD-10-CM

## 2023-03-26 DIAGNOSIS — Z5112 Encounter for antineoplastic immunotherapy: Secondary | ICD-10-CM | POA: Diagnosis not present

## 2023-03-26 DIAGNOSIS — C801 Malignant (primary) neoplasm, unspecified: Secondary | ICD-10-CM

## 2023-03-26 DIAGNOSIS — Z5111 Encounter for antineoplastic chemotherapy: Secondary | ICD-10-CM

## 2023-03-26 LAB — CBC WITH DIFFERENTIAL (CANCER CENTER ONLY)
Abs Immature Granulocytes: 0.07 10*3/uL (ref 0.00–0.07)
Basophils Absolute: 0.1 10*3/uL (ref 0.0–0.1)
Basophils Relative: 1 %
Eosinophils Absolute: 0 10*3/uL (ref 0.0–0.5)
Eosinophils Relative: 0 %
HCT: 32.4 % — ABNORMAL LOW (ref 39.0–52.0)
Hemoglobin: 10.5 g/dL — ABNORMAL LOW (ref 13.0–17.0)
Immature Granulocytes: 1 %
Lymphocytes Relative: 6 %
Lymphs Abs: 0.5 10*3/uL — ABNORMAL LOW (ref 0.7–4.0)
MCH: 28.8 pg (ref 26.0–34.0)
MCHC: 32.4 g/dL (ref 30.0–36.0)
MCV: 88.8 fL (ref 80.0–100.0)
Monocytes Absolute: 0.6 10*3/uL (ref 0.1–1.0)
Monocytes Relative: 7 %
Neutro Abs: 6.5 10*3/uL (ref 1.7–7.7)
Neutrophils Relative %: 85 %
Platelet Count: 249 10*3/uL (ref 150–400)
RBC: 3.65 MIL/uL — ABNORMAL LOW (ref 4.22–5.81)
RDW: 17.7 % — ABNORMAL HIGH (ref 11.5–15.5)
WBC Count: 7.6 10*3/uL (ref 4.0–10.5)
nRBC: 0 % (ref 0.0–0.2)

## 2023-03-26 LAB — CMP (CANCER CENTER ONLY)
ALT: 12 U/L (ref 0–44)
AST: 17 U/L (ref 15–41)
Albumin: 2.9 g/dL — ABNORMAL LOW (ref 3.5–5.0)
Alkaline Phosphatase: 54 U/L (ref 38–126)
Anion gap: 6 (ref 5–15)
BUN: 10 mg/dL (ref 8–23)
CO2: 31 mmol/L (ref 22–32)
Calcium: 9.1 mg/dL (ref 8.9–10.3)
Chloride: 101 mmol/L (ref 98–111)
Creatinine: 0.65 mg/dL (ref 0.61–1.24)
GFR, Estimated: >60 mL/min (ref >60)
Glucose, Bld: 189 mg/dL — ABNORMAL HIGH (ref 70–99)
Potassium: 3.7 mmol/L (ref 3.5–5.1)
Sodium: 138 mmol/L (ref 135–145)
Total Bilirubin: 0.6 mg/dL (ref 0.3–1.2)
Total Protein: 6.5 g/dL (ref 6.5–8.1)

## 2023-03-26 LAB — PROTEIN, URINE, RANDOM: Total Protein, Urine: 14 mg/dL

## 2023-03-26 LAB — TSH: TSH: 2.513 u[IU]/mL (ref 0.350–4.500)

## 2023-03-26 MED ORDER — SODIUM CHLORIDE 0.9 % IV SOLN
145.0000 mg/m2 | Freq: Once | INTRAVENOUS | Status: AC
  Administered 2023-03-26: 300 mg via INTRAVENOUS
  Filled 2023-03-26: qty 50

## 2023-03-26 MED ORDER — SODIUM CHLORIDE 0.9 % IV SOLN
10.0000 mg | Freq: Once | INTRAVENOUS | Status: AC
  Administered 2023-03-26: 10 mg via INTRAVENOUS
  Filled 2023-03-26: qty 10

## 2023-03-26 MED ORDER — SODIUM CHLORIDE 0.9 % IV SOLN
150.0000 mg | Freq: Once | INTRAVENOUS | Status: AC
  Administered 2023-03-26: 150 mg via INTRAVENOUS
  Filled 2023-03-26: qty 150

## 2023-03-26 MED ORDER — SODIUM CHLORIDE 0.9 % IV SOLN
Freq: Once | INTRAVENOUS | Status: AC
  Filled 2023-03-26: qty 250

## 2023-03-26 MED ORDER — MEGESTROL ACETATE 40 MG PO TABS: 80.0000 mg | ORAL_TABLET | Freq: Every day | ORAL | 1 refills | Status: DC

## 2023-03-26 MED ORDER — SODIUM CHLORIDE 0.9 % IV SOLN
15.0000 mg/kg | Freq: Once | INTRAVENOUS | Status: AC
  Administered 2023-03-26: 1200 mg via INTRAVENOUS
  Filled 2023-03-26: qty 32

## 2023-03-26 MED ORDER — HEPARIN SOD (PORK) LOCK FLUSH 100 UNIT/ML IV SOLN
500.0000 [IU] | Freq: Once | INTRAVENOUS | Status: DC | PRN
  Filled 2023-03-26: qty 5

## 2023-03-26 MED ORDER — FAMOTIDINE IN NACL 20-0.9 MG/50ML-% IV SOLN
20.0000 mg | Freq: Once | INTRAVENOUS | Status: AC
  Administered 2023-03-26: 20 mg via INTRAVENOUS
  Filled 2023-03-26: qty 50

## 2023-03-26 MED ORDER — SODIUM CHLORIDE 0.9 % IV SOLN
520.0000 mg | Freq: Once | INTRAVENOUS | Status: AC
  Administered 2023-03-26: 520 mg via INTRAVENOUS
  Filled 2023-03-26: qty 52

## 2023-03-26 MED ORDER — SODIUM CHLORIDE 0.9 % IV SOLN
200.0000 mg | Freq: Once | INTRAVENOUS | Status: AC
  Administered 2023-03-26: 200 mg via INTRAVENOUS
  Filled 2023-03-26: qty 8

## 2023-03-26 MED ORDER — PALONOSETRON HCL INJECTION 0.25 MG/5ML
0.2500 mg | Freq: Once | INTRAVENOUS | Status: AC
  Administered 2023-03-26: 0.25 mg via INTRAVENOUS
  Filled 2023-03-26: qty 5

## 2023-03-26 MED ORDER — DIPHENHYDRAMINE HCL 50 MG/ML IJ SOLN
50.0000 mg | Freq: Once | INTRAMUSCULAR | Status: AC
  Administered 2023-03-26: 50 mg via INTRAVENOUS
  Filled 2023-03-26: qty 1

## 2023-03-26 MED ORDER — SODIUM CHLORIDE 0.9% FLUSH
10.0000 mL | INTRAVENOUS | Status: DC | PRN
  Filled 2023-03-26: qty 10

## 2023-03-26 NOTE — Assessment & Plan Note (Signed)
low-dose lorazepam 0.5 mg every 12 hours as needed for anxiety

## 2023-03-26 NOTE — Progress Notes (Signed)
Hematology/Oncology Progress note [phone] [phone]       Patient Care Team: [name], NP as PCP - General (Family Medicine) [name], RN as Registered Nurse [name], MD as Consulting Physician (Oncology) [name], MD as Referring Physician (Radiation Oncology) Annice Needy, MD as Referring Physician (Vascular Surgery)   REASON FOR VISIT History of  stage IIIA right lung adenocarcinoma, and presumed stage I left lung cancer, now recurrent Stage IV lung adenocarcinoma, follow up for chemotherapy.    ASSESSMENT & PLAN:   Cancer Staging  Primary lung adenocarcinoma, right [facility]) Staging form: Lung, AJCC 8th Edition - Clinical stage from 12/13/2017: Stage IIIA (cT3, cN1, cM0) - Signed by [name], MD on 12/13/2017 - Pathologic stage from 12/24/2022: Stage IV (pTX, pNX, pM1) - Signed by [name], MD on 12/24/2022   Primary lung adenocarcinoma, right (HCC) #History of stage IIIA cT3 N1 M0 lung adenocarcinoma, s/p concurrent chemoradiation [Aug 2019].  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 No actionable, TMB 18.9 m/mb, MSS, PD-L1 <1%  On palliative chemotherapy with carboplatin/Taxol/bevacizumab.   No actionable mutation on NGS. PDL-1<1%, TMB 18.9   Labs are reviewed and discussed with patient. Overall he tolerates chemotherapy.  Proceed with carboplatin/Taxol/bevacizumab/Keytruda Keytruda added on 03/26/23  Plan to repeat scan after 6 cycle of chemo.     Anxiety associated with cancer diagnosis (HCC) low-dose lorazepam 0.5 mg every 12 hours as needed for anxiety  Pleural effusion Palliative therapeutic  thoracentesis as needed.  Pleural effusion has not responded to chemotherapy.  S/p pleural catheter placement.   Encounter for antineoplastic chemotherapy Treatment plan as listed above.   Weight loss Follow up with nutritionist  Recommend nutrition supplements.  Recommend Megace 80mg  BID   No orders of the defined types were placed in this encounter.  Follow up  3 weeks  lab MD  All questions were answered. The patient knows to call the clinic with any problems, questions or concerns.  [name], MD, PhD [facility] Hematology Oncology 03/26/2023      Oncology History:  Mark Mccarty is a  72 y.o.  male with presents for stage IIIA right lung adenocarcinoma, and presumed stage I left lung cancer now with stage IV recurrent lung adenocarcinoma. Former 43 pack year smoking history.   Oncology History  Primary lung adenocarcinoma, right (HCC)  12/13/2017 Initial Diagnosis   cT3 N1 M0 right upper lobe lung adenocarcinoma  August 2019 Finished  concurrent chemoradiation.   03/17/2019 Finished 1 year of durvalumab treatment.   12/13/2017 Cancer Staging   Staging form: Lung, AJCC 8th Edition - Clinical stage from 12/13/2017: Stage IIIA (cT3, cN1, cM0) - Signed by [name], MD on 12/13/2017   12/31/2017 - 02/19/2018 Chemotherapy   Concurrent chemotherapy carboplatin and Taxol. With radiation   03/24/2018 - 03/17/2019 Chemotherapy   LUNG DURVALUMAB Q14D x 1 year     12/01/2020 Imaging   PET  1. The 1.1 cm in long axis left apical nodule has maximum SUV of 2.2. Given the progression in size over the last year, the appearance is suspicious for small/low-grade adenocarcinoma.  2. Upper normal sized AP window lymph nodes have maximum SUV just above blood pool,  and are nonspecific.   Tumor board discussion 12/29/20  Slow-growing left upper lobe nodule.  CT-guided biopsy and biopsy via bronchoscopy are both of high risk due to underlying emphysema.  Consensus reached upon continue  monitoring and repeat CT in 3 months.  If nodule continues to grow, he may benefit from empiric SBRT     03/13/2021 Imaging   CT without contrast showed slightly increase of left upper lobe nodule 12x31mm versus 11 x 8 mm.  Slightly increasing size.  Remains suspicious for bronchogenic neoplasm. Subtle nodularity along the posterior left upper lobe approximately 6 mm is unchanged.  Posttreatment changes distort the right hilum are unchanged.   04/20/2021 - 04/25/2021 Radiation Therapy   Patient received radiation to left upper lobe  nodule for presumed Stage I left upper lobe lung cancer.    07/18/2021 Imaging    CT chest without contrast showed stable posttreatment changes within the right lung.  No findings to suggest recurrent tumor or metastatic disease.  Interval decrease in size of previously characterized irregular nodule in the left upper lobe.    10/18/2021 Imaging    CT chest showed a stable examination of chronic postradiation masslike fibrosis in the right lung. Persistent subsolid nodule in the superior segment of the left lower lobe 1.3 x 1.2 cm.  Nonspecific.  COPD findings, aortic atherosclerosis.  Coronary artery disease.   01/17/2022 Imaging   01/17/2022, CT chest without contrast showed subsolid area in the left lower lobe is similar to prior imaging however with increasing nodularity.  Recommend PET scan for further evaluation Perihilar parenchymal destruction and scarring from prior radiation in the right hilum extending into the right upper and lower lobe unchanged. Left upper lobe parenchymal scarring likely reflects treatment changes.  Marked pulmonary emphysema in the background.  Aortic atherosclerosis.   02/06/2022 Imaging   PET restaging 1. Focus of increasing nodularity in the subpleural left lower lobe identified as concerning on the recent CT chest shows only minimal FDG uptake on PET imaging today. While this is reassuring and the finding may reflect  nfectious/inflammatory etiology, low-grade or well differentiated neoplasm can be poorly FDG avid. Close continued follow-up recommended. 2. Stable perifissural left lower lobe sub solid nodule, also with low level FDG uptake. Continued attention on follow-up recommende    05/17/2022 Imaging   1. Stable perihilar post radiation change and volume loss in the RIGHT hemithorax. 2. Stable mild mediastinal lymphadenopathy. 3. Peripheral nodule in the LEFT lower lobe measures slightly larger but has benign morphology. 4. Stable thickening RIGHT adrenal gland. 5. No new or progressive lung cancer identified   11/16/2022 Imaging   CT chest abdomen pelvis w contrast showed 1. New moderate right pleural effusion with diffuse, nodular,enhancing pleural thickening consistent with pleural metastatic disease. 2. Proximal obstruction of the right upper lobe segmental bronchi with complete atelectasis or consolidation of the right upper lobe, consistent with local malignant recurrence and postobstructive atelectasis of the upper lobe. 3. Interval increase in circumferential soft tissue thickening about the right hilum. Slight interval increase in size in enlarged AP window lymph nodes. 4. Interval enlargement of a subpleural mass of the dependent superior segment left lower lobe consistent with an enlarging pulmonary metastasis or metachronous primary lung malignancy.5. Emphysema. 6. Coronary artery disease. 7. Prostatomegaly.    11/20/2022 Procedure   Thoracentesis right side Pathology is positive for adenocarcinoma.  Insufficient material for ancillary medical testing.    12/03/2022 Imaging   PET scan showed 1. Hypermetabolic mass at the  RIGHT hilum consistent with bronchogenic carcinoma. 2. Postobstructive collapse of the RIGHT upper lobe. 3. Uniform hypermetabolic pleural thickening in the RIGHT lung consistent with pleural metastasis. 4. Large RIGHT effusion. 5. Hypermetabolic nodule in the  superior segment of the LEFT lower lobe consistent with pulmonary metastasis. Synchronous bronchogenic carcinoma versus metastatic lesion. 6. Hypermetabolic bilateral mediastinal adenopathy consistent with nodal metastasis. 7. No evidence of metastatic disease outside the thorax. 8. No skeletal metastasis.      12/17/2022 Procedure   Patient underwent biopsy via bronchoscopy for additional tissue Pathology showed  Right upper lobe lavage-positive for non-small cell carcinoma Right upper lobe ENB assisted biopsy showed adenocarcinoma, acinar and solid patterns. Right upper lobe FNA positive for non-small cell carcinoma.  [facility] NGS/PD-L1 showed KEAP1 missense, TP53 missense, SMARCA4 frameshift, PTPRD copy loss, no EGFR, KRAS, BRAF, ALK, ROS1 RET MET ERBB mutations.  TMB 18.9 m/mb, MSS, PD-L1 <1%    12/24/2022 Cancer Staging   Staging form: Lung, AJCC 8th Edition - Pathologic stage from 12/24/2022: Stage IV (pTX, pNX, pM1) - Signed by [name], MD on 12/24/2022 Stage prefix: Initial diagnosis   01/01/2023 -  Chemotherapy   Patient is on Treatment Plan : LUNG NSCLC Carboplatin + Paclitaxel + Bevacizumab q21d     02/23/2025 Imaging   CT chest w contrast  Right upper lobe collapse with known central right upper lobe/endobronchial mass, poorly visualized.   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 Adjacent right juxta diaphragmatic nodal metastases, improved. Malpositioned right chest port, as above, unchanged. Aortic Atherosclerosis (ICD10-I70.0) and Emphysema (ICD10-J43.9).     Multiple  liver nodules,  07/11/2020 MRI liver with and without contrast showed stable hypervascular lesions in the liver parenchyma without new or progressive findings.  Lesions remain most suggestive of benign etiology.  PET scan did not show evaluation activities.  proteous mirabilis UTI, s/p a course  bactrim, improved symptoms   INTERVAL HISTORY Mark Mccarty is a 72 y.o. male who has above history reviewed by me presents for follow-up of recurrent stage IV non-small cell lung cancer.  + SOB, better since pleurx placement on 03/21/23  No nausea vomiting diarrhea.  Appetite is not good, continues to lose weight.    . Review of Systems  Constitutional:  Positive for fatigue and unexpected weight change. Negative for appetite change, chills and fever.  [name]:   Negative for hearing loss and voice change.   Eyes:  Negative for eye problems and icterus.  Respiratory:  Positive for shortness of breath. Negative for chest tightness and cough.   Cardiovascular:  Negative for chest pain and leg swelling.  Gastrointestinal:  Negative for abdominal distention and abdominal pain.  Endocrine: Negative for hot flashes.  Genitourinary:  Negative for difficulty urinating, dysuria and frequency.   Musculoskeletal:  Negative for arthralgias.  Skin:  Negative for itching and rash.  Neurological:  Negative for headaches, light-headedness and numbness.  [name]:  Negative for adenopathy. Does not bruise/bleed easily.  Psychiatric/Behavioral:  Negative for confusion.      MEDICAL HISTORY:  Past Medical History:  Diagnosis Date   Abnormal nuclear stress test    Anxiety    a.) on BZO (lorazepam) PRN   Asthma    Avascular necrosis of femoral head (HCC)    BPH (benign prostatic hyperplasia)    Cataract    a.) s/p extraction on LEFT   Colon adenomas    COPD (chronic obstructive pulmonary disease) (  HCC)    Coronary artery disease 11/11/2014   a.) MPI 11/11/2014: EF 56%, mild inf wall ischemia; b.) LHC 12/16/2014: 20% mRCA - med mgmt   DDD (degenerative disc disease), lumbar    Diverticulosis    DM type 2 (diabetes mellitus, type 2) (HCC)    Duodenitis    Dysphagia    Dyspnea    ED (erectile dysfunction)    a.) on PDE5i (sildenafil) PRN   Elevated PSA    Former smoker    Gastritis     GERD (gastroesophageal reflux disease)    [name]    Hilar mass    History of hiatal hernia    Hyperlipidemia    Hypertension    Hyperuricemia    Hypokalemia    Left ventricular diastolic dysfunction 10/04/2016   a.) TTE 10/04/2016: EF 50%, mild MR/TR/PR, mod AR, G1DD; b.) TTE 10/10/2017: EF 45%, triv PR, mild AR/MR/TR, G1DD; c.) TTE 03/30/2020: EF 50%, mild-mod AR, mild MR/TR/PR; d.) TTE 04/23/2022: EF >55%, mild LVH, mild LAE, triv TR, mild PR, mod AR/MR   Liver lesion    Obesity    Pedal edema    Peripheral polyneuropathy    Peyronie's disease 05/2022   Pleural effusion    Primary lung adenocarcinoma, right (HCC)    a.) stage IIIA (cT3, cN1, cM0) --> s/p concurrent chemoradiation 2019 --> carboplatin + pacilitaxel (12/31/2017 - 02/19/2018) followed by 1 year (03/24/2018 - 03/17/2019) adjuvant anti-PDL-1 mAB immunotherapy (durvalumab)   [name] (peptic ulcer disease)    Renal insufficiency    Umbilical hernia    [name]     SURGICAL HISTORY: Past Surgical History:  Procedure Laterality Date   CARDIAC CATHETERIZATION N/A 12/16/2014   Procedure: Left Heart Cath;  Surgeon: Marcina Millard, MD;  Location: [facility];  Service: Cardiovascular;  Laterality: N/A;   CARDIOVERSION N/A 01/17/2023   Procedure: CARDIOVERSION;  Surgeon: Clotilde Dieter, DO;  Location: [name];  Service: Cardiovascular;  Laterality: N/A;   CATARACT EXTRACTION Left    COLONOSCOPY     COLONOSCOPY WITH PROPOFOL N/A 01/14/2020   Procedure: COLONOSCOPY WITH PROPOFOL;  Surgeon: Toledo, Boykin Nearing, MD;  Location: [facility] ENDOS[facility];  Service: Gastroenterology;  Laterality: N/A;   CYST REMOVAL TRUNK     chest and back over time and it was removed   CYSTECTOMY     ENDOBRONCHIAL ULTRASOUND N/A 12/09/2017   Procedure: ENDOBRONCHIAL ULTRASOUND;  Surgeon: Shane Crutch, MD;  Location: [name];  Service: Pulmonary;  Laterality: N/A;   IR GUIDED DRAIN W CATHETER  PLACEMENT  03/21/2023   PORTA CATH INSERTION N/A 12/23/2017   Procedure: PORTA CATH INSERTION;  Surgeon: Annice Needy, MD;  Location: [facility];  Service: Cardiovascular;  Laterality: N/A;   PORTA CATH INSERTION N/A 01/08/2023   Procedure: PORTA CATH INSERTION;  Surgeon: Annice Needy, MD;  Location: [facility];  Service: Cardiovascular;  Laterality: N/A;   PORTA CATH REMOVAL N/A 06/04/2022   Procedure: PORTA CATH REMOVAL;  Surgeon: Annice Needy, MD;  Location: [facility];  Service: Cardiovascular;  Laterality: N/A;   TEE WITHOUT CARDIOVERSION N/A 01/17/2023   Procedure: TRANSESOPHAGEAL ECHOCARDIOGRAM (TEE);  Surgeon: Clotilde Dieter, DO;  Location: [name];  Service: Cardiovascular;  Laterality: N/A;   UPPER GI ENDOSCOPY     VIDEO BRONCHOSCOPY WITH ENDOBRONCHIAL ULTRASOUND N/A 12/17/2022   Procedure: VIDEO BRONCHOSCOPY WITH ENDOBRONCHIAL ULTRASOUND;  Surgeon: Vida Rigger, MD;  Location: [name];  Service: Thoracic;  Laterality:  N/A;    SOCIAL HISTORY: Social History   Socioeconomic History   Marital status: Divorced    Spouse name: Not on file   Number of children: Not on file   Years of education: Not on file   Highest education level: Not on file  Occupational History   Not on file  Tobacco Use   Smoking status: Former    Current packs/day: 0.00    Average packs/day: 1.5 packs/day for 35.0 years (52.5 ttl pk-yrs)    Types: Cigarettes    Start date: 06/21/1980    Quit date: 06/22/2015    Years since quitting: 7.7    Passive exposure: Past   Smokeless tobacco: Never  Vaping Use   Vaping status: Never Used  Substance and Sexual Activity   Alcohol use: Not Currently    Comment: weekends   Drug use: No   Sexual activity: Not Currently  Other Topics Concern   Not on file  Social History Narrative   Lives with son [name].   Social Determinants of Health   Financial Resource Strain: Not on file  Food Insecurity: Not on file  Transportation  Needs: Not on file  Physical Activity: Not on file  Stress: Not on file  Social Connections: Unknown (11/14/2021)   Received from [facility]   Social Network    Social Network: Not on file  Intimate Partner Violence: Unknown (10/05/2021)   Received from [facility], [facility]   HITS    Physically Hurt: Not on file    Insult or Talk Down To: Not on file    Threaten Physical Harm: Not on file    Scream or Curse: Not on file    FAMILY HISTORY: Family History  Problem Relation Age of Onset   Hypertension Mother    Breast cancer Mother    Heart attack Mother    Lung cancer Father    Heart disease Sister    Diabetes Sister    Colon cancer Sister     ALLERGIES:  is allergic to penicillins.  MEDICATIONS:  Current Outpatient Medications  Medication Sig Dispense Refill   acetaminophen (TYLENOL) 500 MG tablet Take 500 mg by mouth as needed.     albuterol (VENTOLIN HFA) 108 (90 Base) MCG/ACT inhaler Inhale 2 puffs into the lungs every 4 (four) hours as needed.     amLODipine (NORVASC) 10 MG tablet Take 10 mg by mouth every morning.     apixaban (ELIQUIS) 5 MG TABS tablet Take 5 mg by mouth 2 (two) times daily.     ascorbic acid (VITAMIN C) 1000 MG tablet Take 1,000 mg by mouth daily.     aspirin 81 MG tablet Take 81 mg by mouth daily.     cetirizine (ZYRTEC) 10 MG tablet Take 1 tablet by mouth as needed.     cholecalciferol (VITAMIN D3) 25 MCG (1000 UNIT) tablet Take 1,000 Units by mouth daily.     dexamethasone (DECADRON) 4 MG tablet Take 2 tablets (8mg ) by mouth daily starting the day after carboplatin for 3 days. Take with food 30 tablet 1   fluticasone (FLONASE) 50 MCG/ACT nasal spray Place 1 spray into both nostrils daily.     Fluticasone-Umeclidin-Vilant (TRELEGY ELLIPTA) 200-62.5-25 MCG/ACT AEPB Inhale 1 puff into the lungs every morning.     gabapentin (NEURONTIN) 300 MG capsule Take 300 mg by mouth at bedtime as needed.      ipratropium-albuterol  (DUONEB) 0.5-2.5 (3) MG/3ML SOLN Take 3 mLs by nebulization  every 6 (six) hours as needed.     lisinopril (PRINIVIL,ZESTRIL) 40 MG tablet Take 40 mg by mouth every morning.     LORazepam (ATIVAN) 0.5 MG tablet Take 1 tablet (0.5 mg total) by mouth every 12 (twelve) hours as needed for anxiety. 30 tablet 0   megestrol (MEGACE) 40 MG tablet Take 2 tablets (80 mg total) by mouth daily. 120 tablet 1   metoprolol tartrate (LOPRESSOR) 50 MG tablet Take 50 mg by mouth 2 (two) times daily.     mirabegron ER (MYRBETRIQ) 50 MG TB24 tablet Take 1 tablet (50 mg total) by mouth daily. 30 tablet 11   ondansetron (ZOFRAN) 8 MG tablet Take 1 tablet (8 mg total) by mouth every 8 (eight) hours as needed for nausea or vomiting. Start on the third day after carboplatin. 30 tablet 1   potassium chloride (KLOR-CON) 10 MEQ tablet Take 1 tablet (10 mEq total) by mouth daily. 90 tablet 1   pravastatin (PRAVACHOL) 20 MG tablet Take 20 mg by mouth at bedtime.     prochlorperazine (COMPAZINE) 10 MG tablet Take 1 tablet (10 mg total) by mouth every 6 (six) hours as needed for nausea or vomiting. 90 tablet 3   senna-docusate (SENOKOT-S) 8.6-50 MG tablet Take 2 tablets by mouth daily. 60 tablet 2   spironolactone (ALDACTONE) 25 MG tablet Take by mouth.     tamsulosin (FLOMAX) 0.4 MG CAPS capsule Take 0.4 mg by mouth daily after breakfast.     torsemide (DEMADEX) 20 MG tablet Take 20 mg by mouth every morning.     ibuprofen (ADVIL,MOTRIN) 600 MG tablet Take 600 mg by mouth every 6 (six) hours as needed. (Patient not taking: Reported on 03/18/2023)     lidocaine-prilocaine (EMLA) cream Apply to affected area once (Patient not taking: Reported on 03/18/2023) 30 g 3   mupirocin ointment (BACTROBAN) 2 % Apply 1 Application topically daily. (Patient not taking: Reported on 03/18/2023) 22 g 3   No current facility-administered medications for this visit.   Facility-Administered Medications Ordered in Other Visits  Medication Dose Route  Frequency Provider Last Rate Last Admin   bevacizumab-awwb (MVASI) 1,200 mg in sodium chloride 0.9 % 100 mL chemo infusion  15 mg/kg (Order-Specific) Intravenous Once [name], MD       CARBOplatin (PARAPLATIN) 520 mg in sodium chloride 0.9 % 250 mL chemo infusion  520 mg Intravenous Once [name], MD       dexamethasone (DECADRON) 10 mg in sodium chloride 0.9 % 50 mL IVPB  10 mg Intravenous Once [name], MD       famotidine (PEPCID) IVPB 20 mg premix  20 mg Intravenous Once [name], MD 200 mL/hr at 03/26/23 0942 20 mg at 03/26/23 0942   fosaprepitant (EMEND) 150 mg in sodium chloride 0.9 % 145 mL IVPB  150 mg Intravenous Once [name], MD       heparin lock flush 100 unit/mL  500 Units Intracatheter Once PRN [name], MD       PACLitaxel (TAXOL) 300 mg in sodium chloride 0.9 % 250 mL chemo infusion (> 80mg /m2)  145 mg/m2 (Order-Specific) Intravenous Once [name], MD       pembrolizumab [facility]) 200 mg in sodium chloride 0.9 % 50 mL chemo infusion  200 mg Intravenous Once [name], MD       sodium chloride flush (NS) 0.9 % injection 10 mL  10 mL Intravenous PRN [name], MD   10 mL at 03/18/18 [phone]  sodium chloride flush (NS) 0.9 % injection 10 mL  10 mL Intracatheter PRN [name], MD         PHYSICAL EXAMINATION: ECOG PERFORMANCE STATUS: 0 - Asymptomatic Vitals:   03/26/23 0834  BP: 124/71  Pulse: 93  Resp: 18  Temp: 98 F (36.7 C)  SpO2: 97%   Filed Weights   03/26/23 0834  Weight: 183 lb 4.8 oz (83.1 kg)    Physical Exam Constitutional:      General: He is not in acute distress. [name]:     Head: Normocephalic and atraumatic.  Eyes:     General: No scleral icterus.    Pupils: Pupils are equal, round, and reactive to light.  Cardiovascular:     Rate and Rhythm: Normal rate and regular rhythm.     Heart sounds: Normal heart sounds.  Pulmonary:     Effort: Pulmonary effort is normal. No respiratory distress.     Comments: Decreased breath sound bilaterally Abdominal:      General: Bowel sounds are normal. There is no distension.     Palpations: Abdomen is soft.  Musculoskeletal:        General: No deformity. Normal range of motion.     Cervical back: Normal range of motion and neck supple.  Lymphadenopathy:     Cervical: No cervical adenopathy.  Skin:    General: Skin is warm and dry.     Findings: No rash.     Comments: Left supraclavicular fossa fullness, chronic. Right chest wall medi port with surrounding bruising, small area of skin opening.  no discharge or tenderness.   Neurological:     Mental Status: He is alert and oriented to person, place, and time. Mental status is at baseline.     Cranial Nerves: No cranial nerve deficit.      LABORATORY DATA:  I have reviewed the data as listed    Latest Ref Rng & Units 03/26/2023    7:57 AM 03/18/2023   11:33 AM 03/05/2023    7:54 AM  CBC  WBC 4.0 - 10.5 K/uL 7.6  1.8  7.2   Hemoglobin 13.0 - 17.0 g/dL 60.4  54.0  98.1   Hematocrit 39.0 - 52.0 % 32.4  35.8  38.1   Platelets 150 - 400 K/uL 249  118  173       Latest Ref Rng & Units 03/26/2023    7:57 AM 03/18/2023   11:33 AM 03/05/2023    7:53 AM  CMP  Glucose 70 - 99 mg/dL 191  478  295   BUN 8 - 23 mg/dL 10  9  12    Creatinine 0.61 - 1.24 mg/dL 6.21  3.08  6.57   Sodium 135 - 145 mmol/L 138  135  135   Potassium 3.5 - 5.1 mmol/L 3.7  3.7  3.4   Chloride 98 - 111 mmol/L 101  94  93   CO2 22 - 32 mmol/L 31  31  33   Calcium 8.9 - 10.3 mg/dL 9.1  9.3  9.0   Total Protein 6.5 - 8.1 g/dL 6.5  7.1  6.8   Total Bilirubin 0.3 - 1.2 mg/dL 0.6  0.4  0.2   Alkaline Phos 38 - 126 U/L 54  51  52   AST 15 - 41 U/L 17  22  19    ALT 0 - 44 U/L 12  14  14       RADIOGRAPHIC STUDIES: I have personally reviewed the radiological images  as listed and agreed with the findings in the report.Hardie Shackleton W Catheter Placement  Result Date: 03/21/2023 INDICATION: 72 year old male with a history of malignancy referred for right-sided pleural PleurX  EXAM: IMAGE GUIDED TUNNELED PLEURAL DRAINAGE CATHETER MEDICATIONS: 1 g vancomycin the antibiotics were administered within an appropriate time frame prior to the initiation of the procedure. ANESTHESIA/SEDATION: Moderate (conscious) sedation was employed during this procedure. A total of Versed 1.0 mg and Fentanyl 50 mcg was administered intravenously by the radiology nurse. Total intra-service moderate Sedation Time: 25 minutes. The patient's level of consciousness and vital signs were monitored continuously by radiology nursing throughout the procedure under my direct supervision. COMPLICATIONS: None PROCEDURE: The procedure, risks, benefits, and alternatives were explained to the patient and the patient's family. Specific risks that were addressed included bleeding, infection, pneumothorax, need for further procedure, chance of delayed pneumothorax or hemorrhage, hemoptysis, cardiopulmonary collapse, death. Questions regarding the procedure were encouraged and answered. The patient understands and consents to the procedure. The right chest wall was prepped with Betadine in a sterile fashion, and a sterile drape was applied covering the operative field. A sterile gown and sterile gloves were used for the procedure. Local anesthesia was provided with 1% Lidocaine. Ultrasound image documentation was performed. After creating a small skin incision, a 19 gauge needle was advanced into the pleural cavity under ultrasound guidance. A guide wire was then advanced under fluoroscopy into the pleural space. Pleural access was dilated serially and a 16-French peel-away sheath placed. The skin and subcutaneous tissues were generously infiltrated with 1% lidocaine from the puncture site over the pleura along the intercostal margin anteriorly. A small stab incision was made with 11 blade scalpel at the insertion site of the catheter, and the catheter was back tunneled to the site at the pleural puncture. A tunneled care fusion  catheter was placed. This was tunneled from the incision 5 cm anterior to the pleural access to the access site. The catheter was advanced through the peel-away sheath. The sheath was then removed. Final catheter positioning was confirmed with a fluoroscopic spot image. The access incision was closed with Dermabond. Dermabond was applied to the catheterization incision. Large volume thoracentesis was performed through the new catheter utilizing gravity drainage bag. The patient tolerated the procedure well and remained hemodynamically stable throughout. No complications were encountered and no significant blood loss was encountered. IMPRESSION: Status post image guided right pleural tunneled drainage catheter. Signed, Yvone Neu. Miachel Roux, RPVI [facility] Electronically Signed   By: Gilmer Mor D.O.   On: 03/21/2023 14:39   US THORACENTESIS ASP PLEURAL SPACE W/IMG GUIDE  Result Date: 03/08/2023 INDICATION: Recurrent right pleural effusion. Request received for therapeutic thoracentesis EXAM: ULTRASOUND GUIDED RIGHT THORACENTESIS MEDICATIONS: 7 cc 1% lidocaine COMPLICATIONS: None immediate. PROCEDURE: An ultrasound guided thoracentesis was thoroughly discussed with the patient and questions answered. The benefits, risks, alternatives and complications were also discussed. The patient understands and wishes to proceed with the procedure. Written consent was obtained. Ultrasound was performed to localize and mark an adequate pocket of fluid in the right chest. The area was then prepped and draped in the normal sterile fashion. 1% Lidocaine was used for local anesthesia. Under ultrasound guidance a 6 Fr Safe-T-Centesis catheter was introduced. Thoracentesis was performed. The catheter was removed and a dressing applied. FINDINGS: A total of approximately 1.95 L of serosanguineous fluid was removed. Ordering provider did not request laboratory samples.  IMPRESSION: Successful ultrasound guided  right thoracentesis yielding 1.95 L of pleural fluid. Follow-up chest x-ray revealed no evidence of pneumothorax. Procedure performed by Mina Marble, PA-C Electronically Signed   By: Olive Bass M.D.   On: 03/08/2023 15:17   DG Chest Port 1 View  Result Date: 03/08/2023 CLINICAL DATA:  post right thoracentesis EXAM: PORTABLE CHEST 1 VIEW COMPARISON:  Multiple priors FINDINGS: Stable appearance of the cardiomediastinal silhouette with chronic atelectasis of the right upper lobe. Interval improvement in right effusion with residual small to moderate fluid. No pneumothorax. Left lung remains clear. Similar malpositioning of the right-sided chest port with redundant catheter in the right neck and tip of the catheter terminating in the proximal SVC. IMPRESSION: 1. No findings of pneumothorax following right-sided thoracentesis, with small to moderate residual effusion remaining. 2. Similar malpositioning of the right-sided port with redundant catheter in the right neck and tip of the catheter terminating in the proximal SVC. 3. Other chronic findings as detailed. Electronically Signed   By: Olive Bass M.D.   On: 03/08/2023 15:12   CT Chest W Contrast  Result Date: 02/24/2023 CLINICAL DATA:  Recurrent non-small cell right lung cancer, on immunotherapy. EXAM: CT CHEST WITH CONTRAST TECHNIQUE: Multidetector CT imaging of the chest was performed during intravenous contrast administration. RADIATION DOSE REDUCTION: This exam was performed according to the departmental dose-optimization program which includes automated exposure control, adjustment of the mA and/or kV according to patient size and/or use of iterative reconstruction technique. CONTRAST:  75mL OMNIPAQUE IOHEXOL 300 MG/ML  SOLN COMPARISON:  CT chest dated 12/14/2022.  PET-CT dated 12/03/2022. FINDINGS: Cardiovascular: Heart is normal in size. Trace anterior pericardial effusion. No evidence of thoracic  aortic aneurysm. Very mild atherosclerotic calcifications of the aortic arch. Mild coronary atherosclerosis of the LAD. Right chest port is looped in the neck and terminates in the right brachiocephalic vein just below the thoracic inlet (series 2/image 29), unchanged from priors but malpositioned. Mediastinum/Nodes: No suspicious mediastinal lymphadenopathy. Right juxta diaphragmatic/epicardial nodal soft tissue measuring up to 9 mm short axis (series 2/image 109), previously 18 mm, reflecting improving nodal metastases. Visualized thyroid is unremarkable. 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 This is mildly improved from prior CT. Right upper lobe collapse with known central right upper lobe/endobronchial mass (series 2/images 50 and 54), poorly visualized. Moderate to severe centrilobular and paraseptal emphysematous changes, left upper lobe predominant. 16 mm irregular nodule in the posterior left upper lobe (series 3/image 19), non FDG avid, favoring pleural-parenchymal scarring. 1.8 x 2.7 cm nodule in the superior segment left lower lobe (series 3/image 87), previously 2.1 x 2.7 cm, mildly improved. This corresponds to the patient's known contralateral pulmonary metastasis. Compressive atelectasis in the right middle and lower lobes. No pneumothorax. Upper Abdomen: Visualized upper abdomen is grossly unremarkable, noting stable subcentimeter nodular thickening of the left adrenal gland, which was non FDG avid on PET. Vascular calcifications. Musculoskeletal: Degenerative changes of the visualized thoracolumbar spine. Stable sclerotic lesions in the T7 vertebral body and posterior elements at T11, non FDG avid on PET, likely benign bone islands. IMPRESSION: Right upper lobe collapse with known central right upper lobe/endobronchial mass, poorly  visualized. 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 Adjacent right juxta diaphragmatic nodal metastases, improved. Malpositioned right chest port, as above, unchanged. Aortic Atherosclerosis (ICD10-I70.0) and Emphysema (ICD10-J43.9). Electronically Signed  By: Charline Bills M.D.   On: 02/24/2023 01:12   US THORACENTESIS ASP PLEURAL SPACE W/IMG GUIDE  Result Date: 02/21/2023 INDICATION: Recurrent symptomatic right malignant pleural effusion. EXAM: ULTRASOUND GUIDED RIGHT THORACENTESIS MEDICATIONS: Local 1% lidocaine only. COMPLICATIONS: None immediate. PROCEDURE: An ultrasound guided thoracentesis was thoroughly discussed with the patient and questions answered. The benefits, risks, alternatives and complications were also discussed. The patient understands and wishes to proceed with the procedure. Written consent was obtained. Ultrasound was performed to localize and mark an adequate pocket of fluid in the right chest. The area was then prepped and draped in the normal sterile fashion. 1% Lidocaine was used for local anesthesia. Under ultrasound guidance a 19 gauge, 7-cm, Yueh catheter was introduced. Thoracentesis was performed. The catheter was removed and a dressing applied. FINDINGS: A total of approximately 1.8 L of serosanguineous fluid was removed. IMPRESSION: Successful ultrasound guided right thoracentesis yielding 1.8 L of pleural fluid. This exam was performed by Pattricia Boss PA-C, and was supervised and interpreted by Dr. Fredia Sorrow. Electronically Signed   By: Irish Lack M.D.   On: 02/21/2023 15:40   DG Chest Port 1 View  Result Date: 02/21/2023 CLINICAL DATA:  Status post right thoracentesis. EXAM: PORTABLE CHEST 1 VIEW COMPARISON:  February 01, 2023. FINDINGS: No pneumothorax is noted status post right thoracentesis. Small right pleural effusion remains. IMPRESSION: No pneumothorax  status post right thoracentesis. Electronically Signed   By: Lupita Raider M.D.   On: 02/21/2023 15:04   US THORACENTESIS ASP PLEURAL SPACE W/IMG GUIDE  Result Date: 02/01/2023 INDICATION: Recurrent right malignant pleural effusion EXAM: ULTRASOUND GUIDED RIGHT THORACENTESIS MEDICATIONS: 6 cc 1% lidocaine COMPLICATIONS: None immediate. PROCEDURE: An ultrasound guided thoracentesis was thoroughly discussed with the patient and questions answered. The benefits, risks, alternatives and complications were also discussed. The patient understands and wishes to proceed with the procedure. Written consent was obtained. Ultrasound was performed to localize and mark an adequate pocket of fluid in the right chest. The area was then prepped and draped in the normal sterile fashion. 1% Lidocaine was used for local anesthesia. Under ultrasound guidance a 6 Fr Safe-T-Centesis catheter was introduced. Thoracentesis was performed. The catheter was removed and a dressing applied. FINDINGS: A total of approximately 2.4 L of serosanguineous fluid was removed. Ordering provider did not request laboratory samples. IMPRESSION: Successful ultrasound guided right thoracentesis yielding 2.4 L of pleural fluid. Follow-up chest x-ray revealed no evidence of pneumothorax. Procedure performed by Mina Marble, PA-C Electronically Signed   By: Olive Bass M.D.   On: 02/01/2023 14:26   DG Chest Port 1 View  Result Date: 02/01/2023 CLINICAL DATA:  Status post right thoracentesis. EXAM: PORTABLE CHEST 1 VIEW COMPARISON:  January 22, 2023. FINDINGS: Stable cardiomediastinal silhouette. Left lung is clear. Right internal jugular Port-A-Cath is unchanged. No pneumothorax is noted. Right pleural effusion is significantly smaller. IMPRESSION: No pneumothorax status post right-sided thoracentesis. Right pleural effusion is significantly smaller. Electronically Signed   By: Lupita Raider M.D.   On: 02/01/2023 14:13   DG Chest 2 View  Result  Date: 01/29/2023 CLINICAL DATA:  Pleural effusion.  History of lung adenocarcinoma. EXAM: CHEST - 2 VIEW COMPARISON:  01/10/2023.  CT, 12/14/2022. FINDINGS: Opacity extends from the right mid hemithorax to the base obscuring the right hemidiaphragm and right heart border, increased compared to the prior exam, consistent with a large pleural effusion and associated atelectasis. Pleural thickening or loculated fluid extends over the right lung apex similar to the  prior exam. Left lung is hyperexpanded. Focal nodular opacity in the left mid lung appears smaller than on the prior radiographs. Relative lucency in the upper lobe is consistent with emphysema. There prominent bronchovascular and interstitial markings mostly in the lower lungs. Linear scarring noted at the left apex. These findings are stable. No left pleural effusion. No pneumothorax. Right anterior chest wall, internal jugular Port-A-Cath is stable. Cardiac silhouette is partly obscured, grossly normal in size. No convincing mediastinal or hilar mass or adenopathy. Skeletal structures intact. IMPRESSION: 1. Interval enlargement the right pleural effusion, now large, associated compressive atelectasis. 2. No evidence of pneumonia or pulmonary edema. 3. Nodular opacity in the left mid lung, corresponding to the superior segment of the left lower lobe based on the prior CT, appears smaller than on the recent prior chest radiograph. No new nodules and no other change in the appearance of the left lung. Electronically Signed   By: Amie Portland M.D.   On: 01/29/2023 10:25   ECHO TEE  Result Date: 01/17/2023    TRANSESOPHOGEAL ECHO REPORT   Patient Name:   MR. RENNO Date of Exam: 01/17/2023 Medical Rec #:  [id number]         Height:       72.0 in Accession #:    [id number]        Weight:       207.9 lb Date of Birth:  1950/10/05         BSA:          2.166 m Patient Age:    72 years          BP:           122/82 mmHg Patient Gender: M                  HR:           77 bpm. Exam Location:  [facility] Procedure: Transesophageal Echo, Cardiac Doppler and Color Doppler Indications:     Not listed on TEE check-in sheet  History:         Patient has no prior history of Echocardiogram examinations.  Sonographer:     Cristela Blue Referring Phys:  [id number] [facility] Diagnosing Phys: Rozell Searing Custovic PROCEDURE: The transesophogeal probe was passed without difficulty through the esophogus of the patient. Sedation performed by different physician. The patient developed no complications during the procedure.  IMPRESSIONS  1. Left ventricular ejection fraction, by estimation, is 55 to 60%. The left ventricle has normal function.  2. Right ventricular systolic function was not well visualized. The right ventricular size is not well visualized.  3. No left atrial/left atrial appendage thrombus was detected.  4. The mitral valve was not assessed. No evidence of mitral valve regurgitation.  5. The aortic valve was not assessed. Aortic valve regurgitation is not visualized. Conclusion(s)/Recommendation(s): Normal biventricular function without evidence of hemodynamically significant valvular heart disease. No LA/LAA thrombus identified. Successful cardioversion performed with restoration of normal sinus rhythm. FINDINGS  Left Ventricle: Left ventricular ejection fraction, by estimation, is 55 to 60%. The left ventricle has normal function. The left ventricular internal cavity size was normal in size. Right Ventricle: The right ventricular size is not well visualized. Right vetricular wall thickness was not assessed. Right ventricular systolic function was not well visualized. Left Atrium: Left atrial size was normal in size. No left atrial/left atrial appendage thrombus was detected. Right Atrium: Right atrial size was not assessed. Pericardium: The pericardium was not assessed.  Mitral Valve: The mitral valve was not assessed. No evidence of mitral valve regurgitation. Tricuspid Valve:  The tricuspid valve is not assessed. Tricuspid valve regurgitation is not demonstrated. Aortic Valve: The aortic valve was not assessed. Aortic valve regurgitation is not visualized. Pulmonic Valve: The pulmonic valve was not assessed. Pulmonic valve regurgitation is not visualized. Aorta: Aortic root could not be assessed. IAS/Shunts: The interatrial septum was not assessed. Clotilde Dieter Electronically signed by Clotilde Dieter Signature Date/Time: 01/17/2023/1:54:22 PM    Final    DG Chest Port 1 View  Result Date: 01/10/2023 CLINICAL DATA:  Right-sided thoracentesis. EXAM: PORTABLE CHEST 1 VIEW COMPARISON:  One-view chest x-ray 01/09/2023 FINDINGS: The heart is enlarged.  A right IJ Port-A-Cath is stable. The right pleural effusion has decreased. No pneumothorax is present. No significant left-sided effusion is present. A left perihilar density is again noted. IMPRESSION: 1. Interval decrease in right pleural effusion without pneumothorax. 2. Stable left perihilar density. 3. Cardiomegaly without failure. Electronically Signed   By: Marin Roberts M.D.   On: 01/10/2023 16:25   US THORACENTESIS ASP PLEURAL SPACE W/IMG GUIDE  Result Date: 01/10/2023 INDICATION: Patient with history of right lung cancer and recurrent symptomatic pleural effusion request received for therapeutic thoracentesis. EXAM: ULTRASOUND GUIDED RIGHT THORACENTESIS MEDICATIONS: Local 1% lidocaine only. COMPLICATIONS: None immediate. PROCEDURE: An ultrasound guided thoracentesis was thoroughly discussed with the patient and questions answered. The benefits, risks, alternatives and complications were also discussed. The patient understands and wishes to proceed with the procedure. Written consent was obtained. Ultrasound was performed to localize and mark an adequate pocket of fluid in the right chest. The area was then prepped and draped in the normal sterile fashion. 1% Lidocaine was used for local anesthesia. Under ultrasound  guidance a 19 gauge, 7-cm, Yueh catheter was introduced. Thoracentesis was performed. The catheter was removed and a dressing applied. FINDINGS: A total of approximately 2 L of serosanguineous fluid was removed. IMPRESSION: Successful ultrasound guided right thoracentesis yielding 2 L of pleural fluid. This exam was performed by Pattricia Boss PA-C, and was supervised and interpreted by Dr. Juliette Alcide. Electronically Signed   By: Olive Bass M.D.   On: 01/10/2023 16:13   DG Chest 2 View  Result Date: 01/09/2023 CLINICAL DATA:  Shortness of breath. Primary lung adenocarcinoma, rate EXAM: CHEST - 2 VIEW COMPARISON:  Chest radiographs 12/28/2022 and 12/25/2022; CT 12/14/2022 FINDINGS: New right chest wall porta catheter with tip overlying the superior vena cava. The mid aspect of this catheter curls approximately 2.5 cm superiorly at the right neck base before turning inferiorly and extending into the superior vena cava. Interval increase in homogeneous opacity indicating pleural effusion involving the inferior 60% of the right hemithorax compared to 12/28/2022, now more similar to 12/25/2022. There is again homogeneous opacification from right upper lobe collapse. There is again an approximate 2-3 cm faint density overlying the left midlung corresponding to the nodule better seen on prior CT within the superior segment of the left lower lobe on prior CT. No left pleural effusion. No pneumothorax. Visualized left side of the cardiac silhouette and mediastinal contours are unremarkable. Mild multilevel degenerative disc changes of the thoracic spine. IMPRESSION: 1. New right chest wall porta catheter with tip overlying the superior vena cava. The mid aspect of this catheter curls approximately 2.5 cm superiorly at the right neck base, likely into the right internal jugular vein before turning inferiorly and extending into the superior vena cava. 2. Interval increase in homogeneous opacity indicating moderate-to-large  pleural effusion involving the inferior 60% of the right hemithorax compared to 12/28/2022, now more similar to 12/25/2022. There also may be right lower lobe collapse, as seen on 12/14/2022 CT. 3. There is again homogeneous opacification of the superomedial right hemithorax from right upper lobe collapse. 4. There is again an approximate 2-3 cm faint density overlying the left midlung corresponding to the nodule better seen on prior CT within the superior segment of the left lower lobe on prior CT. Electronically Signed   By: Neita Garnet M.D.   On: 01/09/2023 12:38   PERIPHERAL VASCULAR CATHETERIZATION  Result Date: 01/08/2023 See surgical note for result.  US THORACENTESIS ASP PLEURAL SPACE W/IMG GUIDE  Result Date: 12/28/2022 INDICATION: Right pleural effusion EXAM: ULTRASOUND GUIDED RIGHT THORACENTESIS MEDICATIONS: None. COMPLICATIONS: None immediate. PROCEDURE: An ultrasound guided thoracentesis was thoroughly discussed with the patient and questions answered. The benefits, risks, alternatives and complications were also discussed. The patient understands and wishes to proceed with the procedure. Written consent was obtained. Ultrasound was performed to localize and mark an adequate pocket of fluid in the right chest. The area was then prepped and draped in the normal sterile fashion. 1% Lidocaine was used for local anesthesia. Under ultrasound guidance a 19 gauge, 7-cm, Yueh catheter was introduced. Thoracentesis was performed. The catheter was removed and a dressing applied. FINDINGS: A total of approximately 2.0 L of thin serosanguineous fluid was removed. IMPRESSION: Successful ultrasound guided right thoracentesis yielding 2.0 L of serosanguineous pleural fluid. Electronically Signed   By: Olive Bass M.D.   On: 12/28/2022 15:30   DG Chest Port 1 View  Result Date: 12/28/2022 CLINICAL DATA:  Right pleural effusion, status post thoracentesis EXAM: PORTABLE CHEST 1 VIEW COMPARISON:  Previous  studies including the examination of 12/25/2022 FINDINGS: Transverse diameter of heart is increased. There is homogeneous opacity in the medial right upper lung field consistent with previously described atelectasis in right upper lobe. There is decreasing large right pleural effusion. There is no pneumothorax. Left lung is clear. Evaluation of right lower lung field is limited by the effusion. IMPRESSION: There is decrease in amount of right pleural effusion after thoracentesis. There is no pneumothorax. Electronically Signed   By: Ernie Avena M.D.   On: 12/28/2022 14:44

## 2023-03-26 NOTE — Patient Instructions (Signed)
[place] [facility]  Discharge Instructions: Thank you for choosing [facility] to provide your oncology and hematology care.  If you have a lab appointment with the Cancer Center, please go directly to the Cancer Center and check in at the registration area.  Wear comfortable clothing and clothing appropriate for easy access to any Portacath or PICC line.   We strive to give you quality time with your provider. You may need to reschedule your appointment if you arrive late (15 or more minutes).  Arriving late affects you and other patients whose appointments are after yours.  Also, if you miss three or more appointments without notifying the office, you may be dismissed from the clinic at the provider's discretion.      For prescription refill requests, have your pharmacy contact our office and allow 72 hours for refills to be completed.    Today you received the following chemotherapy and/or immunotherapy agents- keytruda, bevacizumab, taxol, carboplatin       To help prevent nausea and vomiting after your treatment, we encourage you to take your nausea medication as directed.  BELOW ARE SYMPTOMS THAT SHOULD BE REPORTED IMMEDIATELY: *FEVER GREATER THAN 100.4 F (38 C) OR HIGHER *CHILLS OR SWEATING *NAUSEA AND VOMITING THAT IS NOT CONTROLLED WITH YOUR NAUSEA MEDICATION *UNUSUAL SHORTNESS OF BREATH *UNUSUAL BRUISING OR BLEEDING *URINARY PROBLEMS (pain or burning when urinating, or frequent urination) *BOWEL PROBLEMS (unusual diarrhea, constipation, pain near the anus) TENDERNESS IN MOUTH AND THROAT WITH OR WITHOUT PRESENCE OF ULCERS (sore throat, sores in mouth, or a toothache) UNUSUAL RASH, SWELLING OR PAIN  UNUSUAL VAGINAL DISCHARGE OR ITCHING   Items with * indicate a potential emergency and should be followed up as soon as possible or go to the Emergency Department if any problems should occur.  Please show the CHEMOTHERAPY ALERT CARD or  IMMUNOTHERAPY ALERT CARD at check-in to the Emergency Department and triage nurse.  Should you have questions after your visit or need to cancel or reschedule your appointment, please contact [place] [facility]  [phone] and follow the prompts.  Office hours are 8:00 a.m. to 4:30 p.m. Monday - Friday. Please note that voicemails left after 4:00 p.m. may not be returned until the following business day.  We are closed weekends and major holidays. You have access to a nurse at all times for urgent questions. Please call the main number to the clinic [phone] and follow the prompts.  For any non-urgent questions, you may also contact your provider using MyChart. We now offer e-Visits for anyone 84 and older to request care online for non-urgent symptoms. For details visit [email or web address].   Also download the MyChart app! Go to the app store, search "MyChart", open the app, select [place], and log in with your MyChart username and password.

## 2023-03-26 NOTE — Assessment & Plan Note (Addendum)
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 No actionable, TMB 18.9 m/mb, MSS, PD-L1 <1%  On palliative chemotherapy with carboplatin/Taxol/bevacizumab.   No actionable mutation on NGS. PDL-1<1%, TMB 18.9   Labs are reviewed and discussed with patient. Overall he tolerates chemotherapy.  Proceed with carboplatin/Taxol/bevacizumab/Keytruda Keytruda added on 03/26/23  Plan to repeat scan after 6 cycle of chemo.

## 2023-03-26 NOTE — Assessment & Plan Note (Signed)
Treatment plan as listed above.

## 2023-03-26 NOTE — Progress Notes (Signed)
Per Dr Cathie Hoops, ok to adjust Carboplatin dose based on today's CrCl = 79 mL/min.  New dose = 520 mg.  Ebony Hail, Pharm.D., CPP 03/26/2023@9 :42 AM

## 2023-03-26 NOTE — Assessment & Plan Note (Signed)
Follow up with nutritionist  Recommend nutrition supplements.  Recommend Megace 80mg  BID

## 2023-03-26 NOTE — Assessment & Plan Note (Signed)
Palliative therapeutic thoracentesis as needed.  Pleural effusion has not responded to chemotherapy.  S/p pleural catheter placement.

## 2023-03-26 NOTE — Progress Notes (Signed)
Nutrition Follow-up:  Patient with recurrent lung cancer.  Patient receiving MVASI, carboplatin, taxol, keytruda.  Met with patient during infusion.  Patient reports that his appetite is poor.  Yesterday ate better than has in awhile.  Lunch yesterday was chicken thigh, mashed potatoes, green beans and few bites of corn muffin.  Supper was roast beef sandwich and few fries.  Drank ensure plus this am before coming to clinic.  Says that he drinks that 2-3 times a day.      Medications: megace added  Labs: reviewed  Anthropometrics:   Weight 183 lb 4.8 oz today  192 lb 6.4 oz on 9/3 204 lb on 7/31 206 lb on 7/10 217 lb on 5/14  11% weight loss in the last 2 months, significant   NUTRITION DIAGNOSIS: Unintentional weight loss continues    INTERVENTION:  Encouraged patient to start megace to help stimulate appetite. Continue ensure plus TID, if possible Discussed high calorie, high protein foods to add to diet.     MONITORING, EVALUATION, GOAL: weight trends, intake   NEXT VISIT: Tuesday, Oct 15 during infusion  Adora Yeh B. Freida Busman, RD, LDN Registered Dietitian [phone]

## 2023-03-27 LAB — T4: T4, Total: 6.3 ug/dL (ref 4.5–12.0)

## 2023-03-29 ENCOUNTER — Encounter: Payer: Self-pay | Admitting: Oncology

## 2023-04-04 ENCOUNTER — Other Ambulatory Visit: Payer: Self-pay

## 2023-04-04 ENCOUNTER — Ambulatory Visit: Payer: Medicare Other | Admitting: Radiation Oncology

## 2023-04-08 ENCOUNTER — Encounter: Payer: Self-pay | Admitting: Oncology

## 2023-04-08 ENCOUNTER — Ambulatory Visit
Admission: RE | Admit: 2023-04-08 | Discharge: 2023-04-08 | Disposition: A | Discharge status: Home / Self Care | Payer: Medicare Other | Source: Ambulatory Visit | Attending: Radiation Oncology | Admitting: Radiation Oncology

## 2023-04-08 ENCOUNTER — Encounter: Payer: Self-pay | Admitting: Radiation Oncology

## 2023-04-08 VITALS — BP 146/78 | HR 92 | Temp 98.6°F | Resp 16 | Wt 179.0 lb

## 2023-04-08 DIAGNOSIS — C342 Malignant neoplasm of middle lobe, bronchus or lung: Secondary | ICD-10-CM | POA: Diagnosis present

## 2023-04-08 NOTE — Progress Notes (Signed)
Radiation Oncology Follow up Note  Name: Mark Mccarty   Date:   04/08/2023 MRN:  [id number] DOB: 1951-06-12    This 72 y.o. male presents to the clinic today for 1 month follow-up status post SBRT treatment.  To his left lung for presumed stage I disease and patient with known stage IV lung cancer currently undergoing immunotherapy and chemotherapy  REFERRING PROVIDER: [name], NP  HPI: Patient is a 72 year old male now out 1 month having had SBRT to his left lung for presumed stage I non-small cell lung cancer.  He has a history of stage IIIa (cT3 N1 M0) adenocarcinoma status post concurrent chemoradiation therapy in August 2019.  He completed 1 year of Durvalumab.  He is currently on palliative chemotherapy with carboplatin Taxol and bevacizumab.[name]  He recently in early September had thoracentesis with almost 2 L of serosanguineous fluid removed.  He is seen today is on nasal oxygen he specifically denies any cough hemoptysis chest tightness or dysphagia.  COMPLICATIONS OF TREATMENT: none  FOLLOW UP COMPLIANCE: keeps appointments   PHYSICAL EXAM:  BP (!) 146/78   Pulse 92   Temp 98.6 F (37 C) (Tympanic)   Resp 16   Wt 179 lb (81.2 kg)   BMI 24.28 kg/m  Well-developed male on nasal oxygen in NAD.  Well-developed well-nourished patient in NAD. HEENT reveals PERLA, EOMI, discs not visualized.  Oral cavity is clear. No oral mucosal lesions are identified. Neck is clear without evidence of cervical or supraclavicular adenopathy. Lungs are clear to A&P. Cardiac examination is essentially unremarkable with regular rate and rhythm without murmur rub or thrill. Abdomen is benign with no organomegaly or masses noted. Motor sensory and DTR levels are equal and symmetric in the upper and lower extremities. Cranial nerves II through XII are grossly intact. Proprioception is intact. No peripheral adenopathy or edema is identified. No motor or sensory levels are noted. Crude visual fields  are within normal range.  RADIOLOGY RESULTS: No current films to review  PLAN: Present time patient is stable.  Anticipate follow-up films in about 3 months which I will review when the become available.  I have asked to see him back in 4 to 5 months for follow-up.  He continues close follow-up care and treatment through medical oncology.  Patient and family know to call with any concerns.  I would like to take this opportunity to thank you for allowing me to participate in the care of your patient.[name], MD

## 2023-04-09 ENCOUNTER — Other Ambulatory Visit: Payer: Self-pay

## 2023-04-12 ENCOUNTER — Encounter: Payer: Self-pay | Admitting: Oncology

## 2023-04-15 MED FILL — Fosaprepitant Dimeglumine For IV Infusion 150 MG (Base Eq): INTRAVENOUS | Qty: 5 | Status: AC

## 2023-04-15 MED FILL — Dexamethasone Sodium Phosphate Inj 100 MG/10ML: INTRAMUSCULAR | Qty: 1 | Status: AC

## 2023-04-16 ENCOUNTER — Inpatient Hospital Stay: Payer: Medicare Other | Attending: Oncology

## 2023-04-16 ENCOUNTER — Encounter: Payer: Self-pay | Admitting: Oncology

## 2023-04-16 ENCOUNTER — Inpatient Hospital Stay: Payer: Medicare Other

## 2023-04-16 ENCOUNTER — Inpatient Hospital Stay (HOSPITAL_BASED_OUTPATIENT_CLINIC_OR_DEPARTMENT_OTHER): Payer: Medicare Other | Admitting: Oncology

## 2023-04-16 ENCOUNTER — Telehealth: Payer: Self-pay

## 2023-04-16 VITALS — BP 138/77 | HR 85 | Temp 97.7°F | Resp 18 | Wt 183.0 lb

## 2023-04-16 DIAGNOSIS — Z5111 Encounter for antineoplastic chemotherapy: Secondary | ICD-10-CM | POA: Diagnosis present

## 2023-04-16 DIAGNOSIS — C3491 Malignant neoplasm of unspecified part of right bronchus or lung: Secondary | ICD-10-CM

## 2023-04-16 DIAGNOSIS — F411 Generalized anxiety disorder: Secondary | ICD-10-CM

## 2023-04-16 DIAGNOSIS — Z9221 Personal history of antineoplastic chemotherapy: Secondary | ICD-10-CM | POA: Insufficient documentation

## 2023-04-16 DIAGNOSIS — Z923 Personal history of irradiation: Secondary | ICD-10-CM | POA: Diagnosis not present

## 2023-04-16 DIAGNOSIS — R634 Abnormal weight loss: Secondary | ICD-10-CM

## 2023-04-16 DIAGNOSIS — C7800 Secondary malignant neoplasm of unspecified lung: Secondary | ICD-10-CM | POA: Diagnosis not present

## 2023-04-16 DIAGNOSIS — Z7901 Long term (current) use of anticoagulants: Secondary | ICD-10-CM | POA: Insufficient documentation

## 2023-04-16 DIAGNOSIS — Z87891 Personal history of nicotine dependence: Secondary | ICD-10-CM | POA: Insufficient documentation

## 2023-04-16 DIAGNOSIS — C771 Secondary and unspecified malignant neoplasm of intrathoracic lymph nodes: Secondary | ICD-10-CM | POA: Diagnosis not present

## 2023-04-16 DIAGNOSIS — C801 Malignant (primary) neoplasm, unspecified: Secondary | ICD-10-CM

## 2023-04-16 DIAGNOSIS — Z7952 Long term (current) use of systemic steroids: Secondary | ICD-10-CM | POA: Diagnosis not present

## 2023-04-16 DIAGNOSIS — Z5112 Encounter for antineoplastic immunotherapy: Secondary | ICD-10-CM | POA: Diagnosis present

## 2023-04-16 DIAGNOSIS — J90 Pleural effusion, not elsewhere classified: Secondary | ICD-10-CM

## 2023-04-16 DIAGNOSIS — Z7982 Long term (current) use of aspirin: Secondary | ICD-10-CM | POA: Insufficient documentation

## 2023-04-16 DIAGNOSIS — F419 Anxiety disorder, unspecified: Secondary | ICD-10-CM | POA: Insufficient documentation

## 2023-04-16 DIAGNOSIS — C3411 Malignant neoplasm of upper lobe, right bronchus or lung: Secondary | ICD-10-CM | POA: Insufficient documentation

## 2023-04-16 DIAGNOSIS — Z79899 Other long term (current) drug therapy: Secondary | ICD-10-CM | POA: Diagnosis not present

## 2023-04-16 LAB — CBC WITH DIFFERENTIAL (CANCER CENTER ONLY)
Abs Immature Granulocytes: 0.05 10*3/uL (ref 0.00–0.07)
Basophils Absolute: 0 10*3/uL (ref 0.0–0.1)
Basophils Relative: 0 %
Eosinophils Absolute: 0 10*3/uL (ref 0.0–0.5)
Eosinophils Relative: 0 %
HCT: 31.9 % — ABNORMAL LOW (ref 39.0–52.0)
Hemoglobin: 10.5 g/dL — ABNORMAL LOW (ref 13.0–17.0)
Immature Granulocytes: 1 %
Lymphocytes Relative: 5 %
Lymphs Abs: 0.5 10*3/uL — ABNORMAL LOW (ref 0.7–4.0)
MCH: 29.2 pg (ref 26.0–34.0)
MCHC: 32.9 g/dL (ref 30.0–36.0)
MCV: 88.9 fL (ref 80.0–100.0)
Monocytes Absolute: 0.9 10*3/uL (ref 0.1–1.0)
Monocytes Relative: 10 %
Neutro Abs: 8.3 10*3/uL — ABNORMAL HIGH (ref 1.7–7.7)
Neutrophils Relative %: 84 %
Platelet Count: 261 10*3/uL (ref 150–400)
RBC: 3.59 MIL/uL — ABNORMAL LOW (ref 4.22–5.81)
RDW: 18 % — ABNORMAL HIGH (ref 11.5–15.5)
WBC Count: 9.8 10*3/uL (ref 4.0–10.5)
nRBC: 0 % (ref 0.0–0.2)

## 2023-04-16 LAB — CMP (CANCER CENTER ONLY)
ALT: 12 U/L (ref 0–44)
AST: 18 U/L (ref 15–41)
Albumin: 2.8 g/dL — ABNORMAL LOW (ref 3.5–5.0)
Alkaline Phosphatase: 48 U/L (ref 38–126)
Anion gap: 6 (ref 5–15)
BUN: 13 mg/dL (ref 8–23)
CO2: 28 mmol/L (ref 22–32)
Calcium: 9.1 mg/dL (ref 8.9–10.3)
Chloride: 101 mmol/L (ref 98–111)
Creatinine: 0.65 mg/dL (ref 0.61–1.24)
GFR, Estimated: >60 mL/min (ref >60)
Glucose, Bld: 164 mg/dL — ABNORMAL HIGH (ref 70–99)
Potassium: 3.5 mmol/L (ref 3.5–5.1)
Sodium: 135 mmol/L (ref 135–145)
Total Bilirubin: 0.4 mg/dL (ref 0.3–1.2)
Total Protein: 6.8 g/dL (ref 6.5–8.1)

## 2023-04-16 LAB — PROTEIN, URINE, RANDOM: Total Protein, Urine: 15 mg/dL

## 2023-04-16 MED ORDER — MEGESTROL ACETATE 40 MG PO TABS: 80.0000 mg | ORAL_TABLET | Freq: Two times a day (BID) | ORAL | 1 refills | Status: DC

## 2023-04-16 MED ORDER — SODIUM CHLORIDE 0.9 % IV SOLN
200.0000 mg | Freq: Once | INTRAVENOUS | Status: AC
  Administered 2023-04-16: 200 mg via INTRAVENOUS
  Filled 2023-04-16: qty 8

## 2023-04-16 MED ORDER — POTASSIUM CHLORIDE ER 10 MEQ PO TBCR: 10.0000 meq | EXTENDED_RELEASE_TABLET | Freq: Every day | ORAL | 1 refills | Status: DC

## 2023-04-16 MED ORDER — SODIUM CHLORIDE 0.9 % IV SOLN
15.0000 mg/kg | Freq: Once | INTRAVENOUS | Status: AC
  Administered 2023-04-16: 1200 mg via INTRAVENOUS
  Filled 2023-04-16: qty 48

## 2023-04-16 MED ORDER — FAMOTIDINE IN NACL 20-0.9 MG/50ML-% IV SOLN
20.0000 mg | Freq: Once | INTRAVENOUS | Status: AC
  Administered 2023-04-16: 20 mg via INTRAVENOUS
  Filled 2023-04-16: qty 50

## 2023-04-16 MED ORDER — SODIUM CHLORIDE 0.9 % IV SOLN
145.0000 mg/m2 | Freq: Once | INTRAVENOUS | Status: AC
  Administered 2023-04-16: 300 mg via INTRAVENOUS
  Filled 2023-04-16: qty 50

## 2023-04-16 MED ORDER — SODIUM CHLORIDE 0.9 % IV SOLN
522.5000 mg | Freq: Once | INTRAVENOUS | Status: AC
  Administered 2023-04-16: 520 mg via INTRAVENOUS
  Filled 2023-04-16: qty 52

## 2023-04-16 MED ORDER — PALONOSETRON HCL INJECTION 0.25 MG/5ML
0.2500 mg | Freq: Once | INTRAVENOUS | Status: AC
  Administered 2023-04-16: 0.25 mg via INTRAVENOUS
  Filled 2023-04-16: qty 5

## 2023-04-16 MED ORDER — HEPARIN SOD (PORK) LOCK FLUSH 100 UNIT/ML IV SOLN
500.0000 [IU] | Freq: Once | INTRAVENOUS | Status: DC | PRN
  Filled 2023-04-16: qty 5

## 2023-04-16 MED ORDER — SODIUM CHLORIDE 0.9 % IV SOLN
Freq: Once | INTRAVENOUS | Status: AC
  Filled 2023-04-16: qty 250

## 2023-04-16 MED ORDER — SODIUM CHLORIDE 0.9 % IV SOLN
10.0000 mg | Freq: Once | INTRAVENOUS | Status: AC
  Administered 2023-04-16: 10 mg via INTRAVENOUS
  Filled 2023-04-16: qty 10

## 2023-04-16 MED ORDER — DIPHENHYDRAMINE HCL 50 MG/ML IJ SOLN
50.0000 mg | Freq: Once | INTRAMUSCULAR | Status: AC
  Administered 2023-04-16: 50 mg via INTRAVENOUS
  Filled 2023-04-16: qty 1

## 2023-04-16 MED ORDER — SODIUM CHLORIDE 0.9 % IV SOLN
150.0000 mg | Freq: Once | INTRAVENOUS | Status: AC
  Administered 2023-04-16: 150 mg via INTRAVENOUS
  Filled 2023-04-16: qty 150

## 2023-04-16 NOTE — Progress Notes (Signed)
Hematology/Oncology Progress note [phone] [phone]       Patient Care Team: [name], NP as PCP - General (Family Medicine) [name], RN as Registered Nurse [name], MD as Consulting Physician (Oncology) [name], MD as Referring Physician (Radiation Oncology) Annice Needy, MD as Referring Physician (Vascular Surgery)   REASON FOR VISIT History of  stage IIIA right lung adenocarcinoma, and presumed stage I left lung cancer, now recurrent Stage IV lung adenocarcinoma, follow up for chemotherapy.    ASSESSMENT & PLAN:   Cancer Staging  Primary lung adenocarcinoma, right [facility]) Staging form: Lung, AJCC 8th Edition - Clinical stage from 12/13/2017: Stage IIIA (cT3, cN1, cM0) - Signed by [name], MD on 12/13/2017 - Pathologic stage from 12/24/2022: Stage IV (pTX, pNX, pM1) - Signed by [name], MD on 12/24/2022   Primary lung adenocarcinoma, right (HCC) #History of stage IIIA cT3 N1 M0 lung adenocarcinoma, s/p concurrent chemoradiation [Aug 2019].  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 No actionable, TMB 18.9 m/mb, MSS, PD-L1 <1%  On palliative chemotherapy with carboplatin/Taxol/bevacizumab.   No actionable mutation on NGS. PDL-1<1%, TMB 18.9   Labs are reviewed and discussed with patient. Overall he tolerates chemotherapy. Keytruda added on 03/26/23  Proceed with carboplatin/Taxol/bevacizumab/Keytruda  repeat CT scan prior to next chemo    Anxiety associated with cancer diagnosis (HCC) low-dose lorazepam 0.5 mg every 12 hours as needed for anxiety  Pleural effusion Palliative therapeutic thoracentesis as  needed.  Pleural effusion has not responded to chemotherapy.  S/p pleural catheter placement. Drain fluid PRN  Weight loss Follow up with nutritionist  Recommend nutrition supplements.  Recommend Megace 80mg  BID   Orders Placed This Encounter  Procedures   CT CHEST ABDOMEN PELVIS W CONTRAST    Standing Status:   Future    Standing Expiration Date:   04/15/2024    Order Specific Question:   If indicated for the ordered procedure, I authorize the administration of contrast media per Radiology protocol    Answer:   Yes    Order Specific Question:   Does the patient have a contrast media/X-ray dye allergy?    Answer:   No    Order Specific Question:   Preferred imaging location?    Answer:   [facility]    Order Specific Question:   If indicated for the ordered procedure, I authorize the administration of oral contrast media per Radiology protocol    Answer:   Yes   TSH    Standing Status:   Future    Standing Expiration Date:   05/06/2024   T4    Standing Status:   Future    Standing Expiration Date:   05/06/2024   Protein, urine, random    Standing Status:   Future    Standing Expiration Date:   05/06/2024   CBC with Differential (Cancer Center Only)    Standing Status:   Future    Standing Expiration Date:   05/06/2024   CMP (Cancer Center only)    Standing Status:   Future    Standing Expiration Date:   05/06/2024   Protein, urine, random    Standing Status:   Future  Standing Expiration Date:   05/27/2024   CBC with Differential (Cancer Center Only)    Standing Status:   Future    Standing Expiration Date:   05/27/2024   CMP (Cancer Center only)    Standing Status:   Future    Standing Expiration Date:   05/27/2024   Follow up  3 weeks  lab MD  All questions were answered. The patient knows to call the clinic with any problems, questions or concerns.  [name], MD, PhD [facility] Hematology Oncology 04/16/2023      Oncology History:  Mark Mccarty  is a  72 y.o.  male with presents for stage IIIA right lung adenocarcinoma, and presumed stage I left lung cancer now with stage IV recurrent lung adenocarcinoma. Former 43 pack year smoking history.   Oncology History  Primary lung adenocarcinoma, right (HCC)  12/13/2017 Initial Diagnosis   cT3 N1 M0 right upper lobe lung adenocarcinoma  August 2019 Finished  concurrent chemoradiation.   03/17/2019 Finished 1 year of durvalumab treatment.   12/13/2017 Cancer Staging   Staging form: Lung, AJCC 8th Edition - Clinical stage from 12/13/2017: Stage IIIA (cT3, cN1, cM0) - Signed by [name], MD on 12/13/2017   12/31/2017 - 02/19/2018 Chemotherapy   Concurrent chemotherapy carboplatin and Taxol. With radiation   03/24/2018 - 03/17/2019 Chemotherapy   LUNG DURVALUMAB Q14D x 1 year     12/01/2020 Imaging   PET  1. The 1.1 cm in long axis left apical nodule has maximum SUV of 2.2. Given the progression in size over the last year, the appearance is suspicious for small/low-grade adenocarcinoma.  2. Upper normal sized AP window lymph nodes have maximum SUV just above blood pool, and are nonspecific.   Tumor board discussion 12/29/20  Slow-growing left upper lobe nodule.  CT-guided biopsy and biopsy via bronchoscopy are both of high risk due to underlying emphysema.  Consensus reached upon continue monitoring and repeat CT in 3 months.  If nodule continues to grow, he may benefit from empiric SBRT     03/13/2021 Imaging   CT without contrast showed slightly increase of left upper lobe nodule 12x59mm versus 11 x 8 mm.  Slightly increasing size.  Remains suspicious for bronchogenic neoplasm. Subtle nodularity along the posterior left upper lobe approximately 6 mm is unchanged.  Posttreatment changes distort the right hilum are unchanged.   04/20/2021 - 04/25/2021 Radiation Therapy   Patient received radiation to left upper lobe  nodule for presumed Stage I left upper lobe lung cancer.    07/18/2021 Imaging     CT chest without contrast showed stable posttreatment changes within the right lung.  No findings to suggest recurrent tumor or metastatic disease.  Interval decrease in size of previously characterized irregular nodule in the left upper lobe.    10/18/2021 Imaging    CT chest showed a stable examination of chronic postradiation masslike fibrosis in the right lung. Persistent subsolid nodule in the superior segment of the left lower lobe 1.3 x 1.2 cm.  Nonspecific.  COPD findings, aortic atherosclerosis.  Coronary artery disease.   01/17/2022 Imaging   01/17/2022, CT chest without contrast showed subsolid area in the left lower lobe is similar to prior imaging however with increasing nodularity.  Recommend PET scan for further evaluation Perihilar parenchymal destruction and scarring from prior radiation in the right hilum extending into the right upper and lower lobe unchanged. Left upper lobe parenchymal scarring likely reflects treatment changes.  Marked pulmonary emphysema  in the background.  Aortic atherosclerosis.   02/06/2022 Imaging   PET restaging 1. Focus of increasing nodularity in the subpleural left lower lobe identified as concerning on the recent CT chest shows only minimal FDG uptake on PET imaging today. While this is reassuring and the finding may reflect nfectious/inflammatory etiology, low-grade or well differentiated neoplasm can be poorly FDG avid. Close continued follow-up recommended. 2. Stable perifissural left lower lobe sub solid nodule, also with low level FDG uptake. Continued attention on follow-up recommende    05/17/2022 Imaging   1. Stable perihilar post radiation change and volume loss in the RIGHT hemithorax. 2. Stable mild mediastinal lymphadenopathy. 3. Peripheral nodule in the LEFT lower lobe measures slightly larger but has benign morphology. 4. Stable thickening RIGHT adrenal gland. 5. No new or progressive lung cancer identified   11/16/2022 Imaging   CT  chest abdomen pelvis w contrast showed 1. New moderate right pleural effusion with diffuse, nodular,enhancing pleural thickening consistent with pleural metastatic disease. 2. Proximal obstruction of the right upper lobe segmental bronchi with complete atelectasis or consolidation of the right upper lobe, consistent with local malignant recurrence and postobstructive atelectasis of the upper lobe. 3. Interval increase in circumferential soft tissue thickening about the right hilum. Slight interval increase in size in enlarged AP window lymph nodes. 4. Interval enlargement of a subpleural mass of the dependent superior segment left lower lobe consistent with an enlarging pulmonary metastasis or metachronous primary lung malignancy.5. Emphysema. 6. Coronary artery disease. 7. Prostatomegaly.    11/20/2022 Procedure   Thoracentesis right side Pathology is positive for adenocarcinoma.  Insufficient material for ancillary medical testing.    12/03/2022 Imaging   PET scan showed 1. Hypermetabolic mass at the RIGHT hilum consistent with bronchogenic carcinoma. 2. Postobstructive collapse of the RIGHT upper lobe. 3. Uniform hypermetabolic pleural thickening in the RIGHT lung consistent with pleural metastasis. 4. Large RIGHT effusion. 5. Hypermetabolic nodule in the superior segment of the LEFT lower lobe consistent with pulmonary metastasis. Synchronous bronchogenic carcinoma versus metastatic lesion. 6. Hypermetabolic bilateral mediastinal adenopathy consistent with nodal metastasis. 7. No evidence of metastatic disease outside the thorax. 8. No skeletal metastasis.      12/17/2022 Procedure   Patient underwent biopsy via bronchoscopy for additional tissue Pathology showed  Right upper lobe lavage-positive for non-small cell carcinoma Right upper lobe ENB assisted biopsy showed adenocarcinoma, acinar and solid patterns. Right upper lobe FNA positive for non-small cell  carcinoma.  [facility] NGS/PD-L1 showed KEAP1 missense, TP53 missense, SMARCA4 frameshift, PTPRD copy loss, no EGFR, KRAS, BRAF, ALK, ROS1 RET MET ERBB mutations.  TMB 18.9 m/mb, MSS, PD-L1 <1%    12/24/2022 Cancer Staging   Staging form: Lung, AJCC 8th Edition - Pathologic stage from 12/24/2022: Stage IV (pTX, pNX, pM1) - Signed by [name], MD on 12/24/2022 Stage prefix: Initial diagnosis   01/01/2023 -  Chemotherapy   Patient is on Treatment Plan : LUNG NSCLC Carboplatin + Paclitaxel + Bevacizumab q21d     02/23/2025 Imaging   CT chest w contrast  Right upper lobe collapse with known central right upper lobe/endobronchial mass, poorly visualized.   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 Adjacent right juxta diaphragmatic nodal metastases, improved. Malpositioned right chest port, as above, unchanged. Aortic Atherosclerosis (ICD10-I70.0) and Emphysema (ICD10-J43.9).     Multiple  liver nodules,  07/11/2020 MRI liver with and without contrast showed stable hypervascular lesions in the liver parenchyma  without new or progressive findings.  Lesions remain most suggestive of benign etiology.  PET scan did not show evaluation activities.  proteous mirabilis UTI, s/p a course bactrim, improved symptoms   INTERVAL HISTORY Mark Mccarty is a 72 y.o. male who has above history reviewed by me presents for follow-up of recurrent stage IV non-small cell lung cancer.  + SOB, better since pleurx placement on 03/21/23. He gets pleurx drainage every other day. No nausea vomiting diarrhea.  Appetite is not good, weight is stable.  + mid chest pain with cough.    . Review of Systems  Constitutional:  Positive for fatigue and unexpected weight change. Negative for appetite change, chills and fever.  [name]:   Negative for hearing loss and voice change.   Eyes:  Negative for eye problems and icterus.   Respiratory:  Positive for shortness of breath. Negative for chest tightness and cough.   Cardiovascular:  Negative for chest pain and leg swelling.  Gastrointestinal:  Negative for abdominal distention and abdominal pain.  Endocrine: Negative for hot flashes.  Genitourinary:  Negative for difficulty urinating, dysuria and frequency.   Musculoskeletal:  Negative for arthralgias.  Skin:  Negative for itching and rash.  Neurological:  Negative for headaches, light-headedness and numbness.  [name]:  Negative for adenopathy. Does not bruise/bleed easily.  Psychiatric/Behavioral:  Negative for confusion.      MEDICAL HISTORY:  Past Medical History:  Diagnosis Date   Abnormal nuclear stress test    Anxiety    a.) on BZO (lorazepam) PRN   Asthma    Avascular necrosis of femoral head (HCC)    BPH (benign prostatic hyperplasia)    Cataract    a.) s/p extraction on LEFT   Colon adenomas    COPD (chronic obstructive pulmonary disease) (HCC)    Coronary artery disease 11/11/2014   a.) MPI 11/11/2014: EF 56%, mild inf wall ischemia; b.) LHC 12/16/2014: 20% mRCA - med mgmt   DDD (degenerative disc disease), lumbar    Diverticulosis    DM type 2 (diabetes mellitus, type 2) (HCC)    Duodenitis    Dysphagia    Dyspnea    ED (erectile dysfunction)    a.) on PDE5i (sildenafil) PRN   Elevated PSA    Former smoker    Gastritis    GERD (gastroesophageal reflux disease)    [name]    Hilar mass    History of hiatal hernia    Hyperlipidemia    Hypertension    Hyperuricemia    Hypokalemia    Left ventricular diastolic dysfunction 10/04/2016   a.) TTE 10/04/2016: EF 50%, mild MR/TR/PR, mod AR, G1DD; b.) TTE 10/10/2017: EF 45%, triv PR, mild AR/MR/TR, G1DD; c.) TTE 03/30/2020: EF 50%, mild-mod AR, mild MR/TR/PR; d.) TTE 04/23/2022: EF >55%, mild LVH, mild LAE, triv TR, mild PR, mod AR/MR   Liver lesion    Obesity    Pedal edema    Peripheral polyneuropathy     Peyronie's disease 05/2022   Pleural effusion    Primary lung adenocarcinoma, right (HCC)    a.) stage IIIA (cT3, cN1, cM0) --> s/p concurrent chemoradiation 2019 --> carboplatin + pacilitaxel (12/31/2017 - 02/19/2018) followed by 1 year (03/24/2018 - 03/17/2019) adjuvant anti-PDL-1 mAB immunotherapy (durvalumab)   [name] (peptic ulcer disease)    Renal insufficiency    Umbilical hernia    [name]     SURGICAL HISTORY: Past Surgical History:  Procedure Laterality Date   CARDIAC CATHETERIZATION  N/A 12/16/2014   Procedure: Left Heart Cath;  Surgeon: Marcina Millard, MD;  Location: [facility] INVASIVE CV LAB;  Service: Cardiovascular;  Laterality: N/A;   CARDIOVERSION N/A 01/17/2023   Procedure: CARDIOVERSION;  Surgeon: Clotilde Dieter, DO;  Location: [name];  Service: Cardiovascular;  Laterality: N/A;   CATARACT EXTRACTION Left    COLONOSCOPY     COLONOSCOPY WITH PROPOFOL N/A 01/14/2020   Procedure: COLONOSCOPY WITH PROPOFOL;  Surgeon: Toledo, Boykin Nearing, MD;  Location: [facility] ENDOSCOPY;  Service: Gastroenterology;  Laterality: N/A;   CYST REMOVAL TRUNK     chest and back over time and it was removed   CYSTECTOMY     ENDOBRONCHIAL ULTRASOUND N/A 12/09/2017   Procedure: ENDOBRONCHIAL ULTRASOUND;  Surgeon: Shane Crutch, MD;  Location: [name];  Service: Pulmonary;  Laterality: N/A;   IR GUIDED DRAIN W CATHETER PLACEMENT  03/21/2023   PORTA CATH INSERTION N/A 12/23/2017   Procedure: PORTA CATH INSERTION;  Surgeon: Annice Needy, MD;  Location: [facility] INVASIVE CV LAB;  Service: Cardiovascular;  Laterality: N/A;   PORTA CATH INSERTION N/A 01/08/2023   Procedure: PORTA CATH INSERTION;  Surgeon: Annice Needy, MD;  Location: [facility] INVASIVE CV LAB;  Service: Cardiovascular;  Laterality: N/A;   PORTA CATH REMOVAL N/A 06/04/2022   Procedure: PORTA CATH REMOVAL;  Surgeon: Annice Needy, MD;  Location: [facility] INVASIVE CV LAB;  Service: Cardiovascular;  Laterality: N/A;   TEE WITHOUT  CARDIOVERSION N/A 01/17/2023   Procedure: TRANSESOPHAGEAL ECHOCARDIOGRAM (TEE);  Surgeon: Clotilde Dieter, DO;  Location: [name];  Service: Cardiovascular;  Laterality: N/A;   UPPER GI ENDOSCOPY     VIDEO BRONCHOSCOPY WITH ENDOBRONCHIAL ULTRASOUND N/A 12/17/2022   Procedure: VIDEO BRONCHOSCOPY WITH ENDOBRONCHIAL ULTRASOUND;  Surgeon: Vida Rigger, MD;  Location: [name];  Service: Thoracic;  Laterality: N/A;    SOCIAL HISTORY: Social History   Socioeconomic History   Marital status: Divorced    Spouse name: Not on file   Number of children: Not on file   Years of education: Not on file   Highest education level: Not on file  Occupational History   Not on file  Tobacco Use   Smoking status: Former    Current packs/day: 0.00    Average packs/day: 1.5 packs/day for 35.0 years (52.5 ttl pk-yrs)    Types: Cigarettes    Start date: 06/21/1980    Quit date: 06/22/2015    Years since quitting: 7.8    Passive exposure: Past   Smokeless tobacco: Never  Vaping Use   Vaping status: Never Used  Substance and Sexual Activity   Alcohol use: Not Currently    Comment: weekends   Drug use: No   Sexual activity: Not Currently  Other Topics Concern   Not on file  Social History Narrative   Lives with son [name].   Social Determinants of Health   Financial Resource Strain: Not on file  Food Insecurity: Not on file  Transportation Needs: Not on file  Physical Activity: Not on file  Stress: Not on file  Social Connections: Unknown (11/14/2021)   Received from [facility]   Social Network    Social Network: Not on file  Intimate Partner Violence: Unknown (10/05/2021)   Received from [facility]   HITS    Physically Hurt: Not on file    Insult or Talk Down To: Not on file    Threaten Physical Harm: Not on file    Scream or Curse: Not on file  FAMILY HISTORY: Family History  Problem Relation Age of Onset   Hypertension Mother    Breast  cancer Mother    Heart attack Mother    Lung cancer Father    Heart disease Sister    Diabetes Sister    Colon cancer Sister     ALLERGIES:  is allergic to penicillins.  MEDICATIONS:  Current Outpatient Medications  Medication Sig Dispense Refill   acetaminophen (TYLENOL) 500 MG tablet Take 500 mg by mouth as needed.     albuterol (VENTOLIN HFA) 108 (90 Base) MCG/ACT inhaler Inhale 2 puffs into the lungs every 4 (four) hours as needed.     amLODipine (NORVASC) 10 MG tablet Take 10 mg by mouth every morning.     apixaban (ELIQUIS) 5 MG TABS tablet Take 5 mg by mouth 2 (two) times daily.     ascorbic acid (VITAMIN C) 1000 MG tablet Take 1,000 mg by mouth daily.     aspirin 81 MG tablet Take 81 mg by mouth daily.     cetirizine (ZYRTEC) 10 MG tablet Take 1 tablet by mouth as needed.     cholecalciferol (VITAMIN D3) 25 MCG (1000 UNIT) tablet Take 1,000 Units by mouth daily.     dexamethasone (DECADRON) 4 MG tablet Take 2 tablets (8mg ) by mouth daily starting the day after carboplatin for 3 days. Take with food 30 tablet 1   fluticasone (FLONASE) 50 MCG/ACT nasal spray Place 1 spray into both nostrils daily.     Fluticasone-Umeclidin-Vilant (TRELEGY ELLIPTA) 200-62.5-25 MCG/ACT AEPB Inhale 1 puff into the lungs every morning.     gabapentin (NEURONTIN) 300 MG capsule Take 300 mg by mouth at bedtime as needed.      ibuprofen (ADVIL,MOTRIN) 600 MG tablet Take 600 mg by mouth every 6 (six) hours as needed.     ipratropium-albuterol (DUONEB) 0.5-2.5 (3) MG/3ML SOLN Take 3 mLs by nebulization every 6 (six) hours as needed.     lidocaine-prilocaine (EMLA) cream Apply to affected area once 30 g 3   lisinopril (PRINIVIL,ZESTRIL) 40 MG tablet Take 40 mg by mouth every morning.     LORazepam (ATIVAN) 0.5 MG tablet Take 1 tablet (0.5 mg total) by mouth every 12 (twelve) hours as needed for anxiety. 30 tablet 0   metoprolol tartrate (LOPRESSOR) 50 MG tablet Take 50 mg by mouth 2 (two) times daily.      mirabegron ER (MYRBETRIQ) 50 MG TB24 tablet Take 1 tablet (50 mg total) by mouth daily. 30 tablet 11   mupirocin ointment (BACTROBAN) 2 % Apply 1 Application topically daily. 22 g 3   ondansetron (ZOFRAN) 8 MG tablet Take 1 tablet (8 mg total) by mouth every 8 (eight) hours as needed for nausea or vomiting. Start on the third day after carboplatin. 30 tablet 1   pravastatin (PRAVACHOL) 20 MG tablet Take 20 mg by mouth at bedtime.     prochlorperazine (COMPAZINE) 10 MG tablet Take 1 tablet (10 mg total) by mouth every 6 (six) hours as needed for nausea or vomiting. 90 tablet 3   senna-docusate (SENOKOT-S) 8.6-50 MG tablet Take 2 tablets by mouth daily. 60 tablet 2   spironolactone (ALDACTONE) 25 MG tablet Take by mouth.     tamsulosin (FLOMAX) 0.4 MG CAPS capsule Take 0.4 mg by mouth daily after breakfast.     torsemide (DEMADEX) 20 MG tablet Take 20 mg by mouth every morning.     megestrol (MEGACE) 40 MG tablet Take 2 tablets (80 mg total) by  mouth 2 (two) times daily. 120 tablet 1   potassium chloride (KLOR-CON) 10 MEQ tablet Take 1 tablet (10 mEq total) by mouth daily. 90 tablet 1   No current facility-administered medications for this visit.   Facility-Administered Medications Ordered in Other Visits  Medication Dose Route Frequency Provider Last Rate Last Admin   bevacizumab-awwb (MVASI) 1,200 mg in sodium chloride 0.9 % 100 mL chemo infusion  15 mg/kg (Order-Specific) Intravenous Once [name], MD       CARBOplatin (PARAPLATIN) 520 mg in sodium chloride 0.9 % 250 mL chemo infusion  520 mg Intravenous Once [name], MD       diphenhydrAMINE (BENADRYL) injection 50 mg  50 mg Intravenous Once [name], MD       famotidine (PEPCID) IVPB 20 mg premix  20 mg Intravenous Once [name], MD 200 mL/hr at 04/16/23 1006 20 mg at 04/16/23 1006   heparin lock flush 100 unit/mL  500 Units Intracatheter Once PRN [name], MD       PACLitaxel (TAXOL) 300 mg in sodium chloride 0.9 % 250 mL chemo infusion (>  80mg /m2)  145 mg/m2 (Order-Specific) Intravenous Once [name], MD       palonosetron (ALOXI) injection 0.25 mg  0.25 mg Intravenous Once [name], MD       pembrolizumab [facility]) 200 mg in sodium chloride 0.9 % 50 mL chemo infusion  200 mg Intravenous Once [name], MD       sodium chloride flush (NS) 0.9 % injection 10 mL  10 mL Intravenous PRN [name], MD   10 mL at 03/18/18 0858     PHYSICAL EXAMINATION: ECOG PERFORMANCE STATUS: 0 - Asymptomatic Vitals:   04/16/23 0851  BP: 138/77  Pulse: 85  Resp: 18  Temp: 97.7 F (36.5 C)  SpO2: 99%   Filed Weights   04/16/23 0851  Weight: 183 lb (83 kg)    Physical Exam Constitutional:      General: He is not in acute distress. [name]:     Head: Normocephalic and atraumatic.  Eyes:     General: No scleral icterus.    Pupils: Pupils are equal, round, and reactive to light.  Cardiovascular:     Rate and Rhythm: Normal rate and regular rhythm.     Heart sounds: Normal heart sounds.  Pulmonary:     Effort: Pulmonary effort is normal. No respiratory distress.     Comments: Decreased breath sound bilaterally Abdominal:     General: Bowel sounds are normal. There is no distension.     Palpations: Abdomen is soft.  Musculoskeletal:        General: No deformity. Normal range of motion.     Cervical back: Normal range of motion and neck supple.  Lymphadenopathy:     Cervical: No cervical adenopathy.  Skin:    General: Skin is warm and dry.     Findings: No rash.     Comments: Left supraclavicular fossa fullness, chronic. Right chest wall medi port with surrounding bruising, small area of skin opening.  no discharge or tenderness.   Neurological:     Mental Status: He is alert and oriented to person, place, and time. Mental status is at baseline.     Cranial Nerves: No cranial nerve deficit.      LABORATORY DATA:  I have reviewed the data as listed    Latest Ref Rng & Units 04/16/2023    8:27 AM 03/26/2023    7:57 AM  03/18/2023  11:33 AM  CBC  WBC 4.0 - 10.5 K/uL 9.8  7.6  1.8   Hemoglobin 13.0 - 17.0 g/dL 16.1  09.6  04.5   Hematocrit 39.0 - 52.0 % 31.9  32.4  35.8   Platelets 150 - 400 K/uL 261  249  118       Latest Ref Rng & Units 04/16/2023    8:27 AM 03/26/2023    7:57 AM 03/18/2023   11:33 AM  CMP  Glucose 70 - 99 mg/dL 409  811  914   BUN 8 - 23 mg/dL 13  10  9    Creatinine 0.61 - 1.24 mg/dL 7.82  9.56  2.13   Sodium 135 - 145 mmol/L 135  138  135   Potassium 3.5 - 5.1 mmol/L 3.5  3.7  3.7   Chloride 98 - 111 mmol/L 101  101  94   CO2 22 - 32 mmol/L 28  31  31    Calcium 8.9 - 10.3 mg/dL 9.1  9.1  9.3   Total Protein 6.5 - 8.1 g/dL 6.8  6.5  7.1   Total Bilirubin 0.3 - 1.2 mg/dL 0.4  0.6  0.4   Alkaline Phos 38 - 126 U/L 48  54  51   AST 15 - 41 U/L 18  17  22    ALT 0 - 44 U/L 12  12  14       RADIOGRAPHIC STUDIES: I have personally reviewed the radiological images as listed and agreed with the findings in the report.Hardie Shackleton W Catheter Placement  Result Date: 03/21/2023 INDICATION: 72 year old male with a history of malignancy referred for right-sided pleural PleurX EXAM: IMAGE GUIDED TUNNELED PLEURAL DRAINAGE CATHETER MEDICATIONS: 1 g vancomycin the antibiotics were administered within an appropriate time frame prior to the initiation of the procedure. ANESTHESIA/SEDATION: Moderate (conscious) sedation was employed during this procedure. A total of Versed 1.0 mg and Fentanyl 50 mcg was administered intravenously by the radiology nurse. Total intra-service moderate Sedation Time: 25 minutes. The patient's level of consciousness and vital signs were monitored continuously by radiology nursing throughout the procedure under my direct supervision. COMPLICATIONS: None PROCEDURE: The procedure, risks, benefits, and alternatives were explained to the patient and the patient's family. Specific risks that were addressed included bleeding, infection, pneumothorax, need for further  procedure, chance of delayed pneumothorax or hemorrhage, hemoptysis, cardiopulmonary collapse, death. Questions regarding the procedure were encouraged and answered. The patient understands and consents to the procedure. The right chest wall was prepped with Betadine in a sterile fashion, and a sterile drape was applied covering the operative field. A sterile gown and sterile gloves were used for the procedure. Local anesthesia was provided with 1% Lidocaine. Ultrasound image documentation was performed. After creating a small skin incision, a 19 gauge needle was advanced into the pleural cavity under ultrasound guidance. A guide wire was then advanced under fluoroscopy into the pleural space. Pleural access was dilated serially and a 16-French peel-away sheath placed. The skin and subcutaneous tissues were generously infiltrated with 1% lidocaine from the puncture site over the pleura along the intercostal margin anteriorly. A small stab incision was made with 11 blade scalpel at the insertion site of the catheter, and the catheter was back tunneled to the site at the pleural puncture. A tunneled care fusion catheter was placed. This was tunneled from the incision 5 cm anterior to the pleural access to the access site. The catheter was advanced through the peel-away sheath. The sheath was  then removed. Final catheter positioning was confirmed with a fluoroscopic spot image. The access incision was closed with Dermabond. Dermabond was applied to the catheterization incision. Large volume thoracentesis was performed through the new catheter utilizing gravity drainage bag. The patient tolerated the procedure well and remained hemodynamically stable throughout. No complications were encountered and no significant blood loss was encountered. IMPRESSION: Status post image guided right pleural tunneled drainage catheter. Signed, Yvone Neu. Miachel Roux, RPVI [facility]  Radiology Electronically Signed   By: Gilmer Mor D.O.   On: 03/21/2023 14:39   US THORACENTESIS ASP PLEURAL SPACE W/IMG GUIDE  Result Date: 03/08/2023 INDICATION: Recurrent right pleural effusion. Request received for therapeutic thoracentesis EXAM: ULTRASOUND GUIDED RIGHT THORACENTESIS MEDICATIONS: 7 cc 1% lidocaine COMPLICATIONS: None immediate. PROCEDURE: An ultrasound guided thoracentesis was thoroughly discussed with the patient and questions answered. The benefits, risks, alternatives and complications were also discussed. The patient understands and wishes to proceed with the procedure. Written consent was obtained. Ultrasound was performed to localize and mark an adequate pocket of fluid in the right chest. The area was then prepped and draped in the normal sterile fashion. 1% Lidocaine was used for local anesthesia. Under ultrasound guidance a 6 Fr Safe-T-Centesis catheter was introduced. Thoracentesis was performed. The catheter was removed and a dressing applied. FINDINGS: A total of approximately 1.95 L of serosanguineous fluid was removed. Ordering provider did not request laboratory samples. IMPRESSION: Successful ultrasound guided right thoracentesis yielding 1.95 L of pleural fluid. Follow-up chest x-ray revealed no evidence of pneumothorax. Procedure performed by Mina Marble, PA-C Electronically Signed   By: Olive Bass M.D.   On: 03/08/2023 15:17   DG Chest Port 1 View  Result Date: 03/08/2023 CLINICAL DATA:  post right thoracentesis EXAM: PORTABLE CHEST 1 VIEW COMPARISON:  Multiple priors FINDINGS: Stable appearance of the cardiomediastinal silhouette with chronic atelectasis of the right upper lobe. Interval improvement in right effusion with residual small to moderate fluid. No pneumothorax. Left lung remains clear. Similar malpositioning of the right-sided chest port with redundant catheter in the right neck and tip of the catheter terminating in the proximal SVC. IMPRESSION: 1.  No findings of pneumothorax following right-sided thoracentesis, with small to moderate residual effusion remaining. 2. Similar malpositioning of the right-sided port with redundant catheter in the right neck and tip of the catheter terminating in the proximal SVC. 3. Other chronic findings as detailed. Electronically Signed   By: Olive Bass M.D.   On: 03/08/2023 15:12   CT Chest W Contrast  Result Date: 02/24/2023 CLINICAL DATA:  Recurrent non-small cell right lung cancer, on immunotherapy. EXAM: CT CHEST WITH CONTRAST TECHNIQUE: Multidetector CT imaging of the chest was performed during intravenous contrast administration. RADIATION DOSE REDUCTION: This exam was performed according to the departmental dose-optimization program which includes automated exposure control, adjustment of the mA and/or kV according to patient size and/or use of iterative reconstruction technique. CONTRAST:  75mL OMNIPAQUE IOHEXOL 300 MG/ML  SOLN COMPARISON:  CT chest dated 12/14/2022.  PET-CT dated 12/03/2022. FINDINGS: Cardiovascular: Heart is normal in size. Trace anterior pericardial effusion. No evidence of thoracic aortic aneurysm. Very mild atherosclerotic calcifications of the aortic arch. Mild coronary atherosclerosis of the LAD. Right chest port is looped in the neck and terminates in the right brachiocephalic vein just below the thoracic inlet (series 2/image 29), unchanged from priors but malpositioned. Mediastinum/Nodes: No suspicious mediastinal lymphadenopathy. Right juxta diaphragmatic/epicardial nodal soft tissue measuring up to 9 mm short axis (  series 2/image 109), previously 18 mm, reflecting improving nodal metastases. Visualized thyroid is unremarkable. 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 This is mildly improved from prior CT. Right upper  lobe collapse with known central right upper lobe/endobronchial mass (series 2/images 50 and 54), poorly visualized. Moderate to severe centrilobular and paraseptal emphysematous changes, left upper lobe predominant. 16 mm irregular nodule in the posterior left upper lobe (series 3/image 19), non FDG avid, favoring pleural-parenchymal scarring. 1.8 x 2.7 cm nodule in the superior segment left lower lobe (series 3/image 87), previously 2.1 x 2.7 cm, mildly improved. This corresponds to the patient's known contralateral pulmonary metastasis. Compressive atelectasis in the right middle and lower lobes. No pneumothorax. Upper Abdomen: Visualized upper abdomen is grossly unremarkable, noting stable subcentimeter nodular thickening of the left adrenal gland, which was non FDG avid on PET. Vascular calcifications. Musculoskeletal: Degenerative changes of the visualized thoracolumbar spine. Stable sclerotic lesions in the T7 vertebral body and posterior elements at T11, non FDG avid on PET, likely benign bone islands. IMPRESSION: Right upper lobe collapse with known central right upper lobe/endobronchial mass, poorly visualized. 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 Adjacent right juxta diaphragmatic nodal metastases, improved. Malpositioned right chest port, as above, unchanged. Aortic Atherosclerosis (ICD10-I70.0) and Emphysema (ICD10-J43.9). Electronically Signed   By: Charline Bills M.D.   On: 02/24/2023 01:12   US THORACENTESIS ASP PLEURAL SPACE W/IMG GUIDE  Result Date: 02/21/2023 INDICATION: Recurrent symptomatic right malignant pleural effusion. EXAM: ULTRASOUND GUIDED RIGHT THORACENTESIS MEDICATIONS: Local 1% lidocaine only. COMPLICATIONS: None immediate. PROCEDURE: An ultrasound guided thoracentesis was thoroughly discussed with the patient and questions answered. The benefits, risks, alternatives and  complications were also discussed. The patient understands and wishes to proceed with the procedure. Written consent was obtained. Ultrasound was performed to localize and mark an adequate pocket of fluid in the right chest. The area was then prepped and draped in the normal sterile fashion. 1% Lidocaine was used for local anesthesia. Under ultrasound guidance a 19 gauge, 7-cm, Yueh catheter was introduced. Thoracentesis was performed. The catheter was removed and a dressing applied. FINDINGS: A total of approximately 1.8 L of serosanguineous fluid was removed. IMPRESSION: Successful ultrasound guided right thoracentesis yielding 1.8 L of pleural fluid. This exam was performed by Pattricia Boss PA-C, and was supervised and interpreted by Dr. Fredia Sorrow. Electronically Signed   By: Irish Lack M.D.   On: 02/21/2023 15:40   DG Chest Port 1 View  Result Date: 02/21/2023 CLINICAL DATA:  Status post right thoracentesis. EXAM: PORTABLE CHEST 1 VIEW COMPARISON:  February 01, 2023. FINDINGS: No pneumothorax is noted status post right thoracentesis. Small right pleural effusion remains. IMPRESSION: No pneumothorax status post right thoracentesis. Electronically Signed   By: Lupita Raider M.D.   On: 02/21/2023 15:04   US THORACENTESIS ASP PLEURAL SPACE W/IMG GUIDE  Result Date: 02/01/2023 INDICATION: Recurrent right malignant pleural effusion EXAM: ULTRASOUND GUIDED RIGHT THORACENTESIS MEDICATIONS: 6 cc 1% lidocaine COMPLICATIONS: None immediate. PROCEDURE: An ultrasound guided thoracentesis was thoroughly discussed with the patient and questions answered. The benefits, risks, alternatives and complications were also discussed. The patient understands and wishes to proceed with the procedure. Written consent was obtained. Ultrasound was performed to localize and mark an adequate pocket of fluid in the right chest. The area was then prepped and draped  in the normal sterile fashion. 1% Lidocaine was used for local  anesthesia. Under ultrasound guidance a 6 Fr Safe-T-Centesis catheter was introduced. Thoracentesis was performed. The catheter was removed and a dressing applied. FINDINGS: A total of approximately 2.4 L of serosanguineous fluid was removed. Ordering provider did not request laboratory samples. IMPRESSION: Successful ultrasound guided right thoracentesis yielding 2.4 L of pleural fluid. Follow-up chest x-ray revealed no evidence of pneumothorax. Procedure performed by Mina Marble, PA-C Electronically Signed   By: Olive Bass M.D.   On: 02/01/2023 14:26   DG Chest Port 1 View  Result Date: 02/01/2023 CLINICAL DATA:  Status post right thoracentesis. EXAM: PORTABLE CHEST 1 VIEW COMPARISON:  January 22, 2023. FINDINGS: Stable cardiomediastinal silhouette. Left lung is clear. Right internal jugular Port-A-Cath is unchanged. No pneumothorax is noted. Right pleural effusion is significantly smaller. IMPRESSION: No pneumothorax status post right-sided thoracentesis. Right pleural effusion is significantly smaller. Electronically Signed   By: Lupita Raider M.D.   On: 02/01/2023 14:13   DG Chest 2 View  Result Date: 01/29/2023 CLINICAL DATA:  Pleural effusion.  History of lung adenocarcinoma. EXAM: CHEST - 2 VIEW COMPARISON:  01/10/2023.  CT, 12/14/2022. FINDINGS: Opacity extends from the right mid hemithorax to the base obscuring the right hemidiaphragm and right heart border, increased compared to the prior exam, consistent with a large pleural effusion and associated atelectasis. Pleural thickening or loculated fluid extends over the right lung apex similar to the prior exam. Left lung is hyperexpanded. Focal nodular opacity in the left mid lung appears smaller than on the prior radiographs. Relative lucency in the upper lobe is consistent with emphysema. There prominent bronchovascular and interstitial markings mostly in the lower lungs. Linear scarring noted at the left apex. These findings are stable. No  left pleural effusion. No pneumothorax. Right anterior chest wall, internal jugular Port-A-Cath is stable. Cardiac silhouette is partly obscured, grossly normal in size. No convincing mediastinal or hilar mass or adenopathy. Skeletal structures intact. IMPRESSION: 1. Interval enlargement the right pleural effusion, now large, associated compressive atelectasis. 2. No evidence of pneumonia or pulmonary edema. 3. Nodular opacity in the left mid lung, corresponding to the superior segment of the left lower lobe based on the prior CT, appears smaller than on the recent prior chest radiograph. No new nodules and no other change in the appearance of the left lung. Electronically Signed   By: Amie Portland M.D.   On: 01/29/2023 10:25   ECHO TEE  Result Date: 01/17/2023    TRANSESOPHOGEAL ECHO REPORT   Patient Name:   Mark Mccarty Date of Exam: 01/17/2023 Medical Rec #:  [id number]         Height:       72.0 in Accession #:    [id number]        Weight:       207.9 lb Date of Birth:  [birth year]         BSA:          2.166 m Patient Age:    72 years          BP:           122/82 mmHg Patient Gender: M                 HR:           77 bpm. Exam Location:  [facility] Procedure: Transesophageal Echo, Cardiac Doppler and Color Doppler Indications:     Not listed on TEE  check-in sheet  History:         Patient has no prior history of Echocardiogram examinations.  Sonographer:     Cristela Blue Referring Phys:  [id number] [facility] CUSTOVIC Diagnosing Phys: Rozell Searing Custovic PROCEDURE: The transesophogeal probe was passed without difficulty through the esophogus of the patient. Sedation performed by different physician. The patient developed no complications during the procedure.  IMPRESSIONS  1. Left ventricular ejection fraction, by estimation, is 55 to 60%. The left ventricle has normal function.  2. Right ventricular systolic function was not well visualized. The right ventricular size is not well visualized.  3. No left atrial/left  atrial appendage thrombus was detected.  4. The mitral valve was not assessed. No evidence of mitral valve regurgitation.  5. The aortic valve was not assessed. Aortic valve regurgitation is not visualized. Conclusion(s)/Recommendation(s): Normal biventricular function without evidence of hemodynamically significant valvular heart disease. No LA/LAA thrombus identified. Successful cardioversion performed with restoration of normal sinus rhythm. FINDINGS  Left Ventricle: Left ventricular ejection fraction, by estimation, is 55 to 60%. The left ventricle has normal function. The left ventricular internal cavity size was normal in size. Right Ventricle: The right ventricular size is not well visualized. Right vetricular wall thickness was not assessed. Right ventricular systolic function was not well visualized. Left Atrium: Left atrial size was normal in size. No left atrial/left atrial appendage thrombus was detected. Right Atrium: Right atrial size was not assessed. Pericardium: The pericardium was not assessed. Mitral Valve: The mitral valve was not assessed. No evidence of mitral valve regurgitation. Tricuspid Valve: The tricuspid valve is not assessed. Tricuspid valve regurgitation is not demonstrated. Aortic Valve: The aortic valve was not assessed. Aortic valve regurgitation is not visualized. Pulmonic Valve: The pulmonic valve was not assessed. Pulmonic valve regurgitation is not visualized. Aorta: Aortic root could not be assessed. IAS/Shunts: The interatrial septum was not assessed. Clotilde Dieter Electronically signed by Clotilde Dieter Signature Date/Time: 01/17/2023/1:54:22 PM    Final

## 2023-04-16 NOTE — Assessment & Plan Note (Signed)
low-dose lorazepam 0.5 mg every 12 hours as needed for anxiety

## 2023-04-16 NOTE — Patient Instructions (Signed)
[place] [facility]  Discharge Instructions: Thank you for choosing [facility] to provide your oncology and hematology care.  If you have a lab appointment with the Cancer Center, please go directly to the Cancer Center and check in at the registration area.  Wear comfortable clothing and clothing appropriate for easy access to any Portacath or PICC line.   We strive to give you quality time with your provider. You may need to reschedule your appointment if you arrive late (15 or more minutes).  Arriving late affects you and other patients whose appointments are after yours.  Also, if you miss three or more appointments without notifying the office, you may be dismissed from the clinic at the provider's discretion.      For prescription refill requests, have your pharmacy contact our office and allow 72 hours for refills to be completed.    Today you received the following chemotherapy and/or immunotherapy agents Keytruda, MVASI, Taxol, Carboplatin      To help prevent nausea and vomiting after your treatment, we encourage you to take your nausea medication as directed.  BELOW ARE SYMPTOMS THAT SHOULD BE REPORTED IMMEDIATELY: *FEVER GREATER THAN 100.4 F (38 C) OR HIGHER *CHILLS OR SWEATING *NAUSEA AND VOMITING THAT IS NOT CONTROLLED WITH YOUR NAUSEA MEDICATION *UNUSUAL SHORTNESS OF BREATH *UNUSUAL BRUISING OR BLEEDING *URINARY PROBLEMS (pain or burning when urinating, or frequent urination) *BOWEL PROBLEMS (unusual diarrhea, constipation, pain near the anus) TENDERNESS IN MOUTH AND THROAT WITH OR WITHOUT PRESENCE OF ULCERS (sore throat, sores in mouth, or a toothache) UNUSUAL RASH, SWELLING OR PAIN  UNUSUAL VAGINAL DISCHARGE OR ITCHING   Items with * indicate a potential emergency and should be followed up as soon as possible or go to the Emergency Department if any problems should occur.  Please show the CHEMOTHERAPY ALERT CARD or IMMUNOTHERAPY  ALERT CARD at check-in to the Emergency Department and triage nurse.  Should you have questions after your visit or need to cancel or reschedule your appointment, please contact [place] [facility]  [phone] and follow the prompts.  Office hours are 8:00 a.m. to 4:30 p.m. Monday - Friday. Please note that voicemails left after 4:00 p.m. may not be returned until the following business day.  We are closed weekends and major holidays. You have access to a nurse at all times for urgent questions. Please call the main number to the clinic [phone] and follow the prompts.  For any non-urgent questions, you may also contact your provider using MyChart. We now offer e-Visits for anyone 66 and older to request care online for non-urgent symptoms. For details visit [email or web address].   Also download the MyChart app! Go to the app store, search "MyChart", open the app, select [place], and log in with your MyChart username and password.

## 2023-04-16 NOTE — Assessment & Plan Note (Signed)
Follow up with nutritionist  Recommend nutrition supplements.  Recommend Megace 80mg  BID

## 2023-04-16 NOTE — Progress Notes (Signed)
Nutrition Follow-up:  Patient with recurrent lung cancer.  Patient receiving carboplatin and taxol.    Met with patient during infusion.  Reports that his appetite is a little bit better.  He woke up at midnight recently and made himself and egg sandwich.  Drinks ensure 2 times a day.  Started the megace  Medications: megace  Labs: reviewed  Anthropometrics:   Weight 183 lb today 183 lb 4.8 oz on 10/1 192 lb 6.4 oz on 9/3 204 lb on 7/31   NUTRITION DIAGNOSIS: Unintentional weight loss stable   INTERVENTION:  Continue oral nutrition supplements Continue high calorie, high protein foods Continue appetite stimulant    MONITORING, EVALUATION, GOAL: weight trends, intake   NEXT VISIT: Tuesday, Nov 26 during infusion  Mark Mccarty, RD, LDN Registered Dietitian [phone]

## 2023-04-16 NOTE — Progress Notes (Signed)
Pt here for follow up. Pt reports sharp pain to chest when he coughs.

## 2023-04-16 NOTE — Assessment & Plan Note (Signed)
Palliative therapeutic thoracentesis as needed.  Pleural effusion has not responded to chemotherapy.  S/p pleural catheter placement. Drain fluid PRN

## 2023-04-16 NOTE — Assessment & Plan Note (Addendum)
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 No actionable, TMB 18.9 m/mb, MSS, PD-L1 <1%  On palliative chemotherapy with carboplatin/Taxol/bevacizumab.   No actionable mutation on NGS. PDL-1<1%, TMB 18.9   Labs are reviewed and discussed with patient. Overall he tolerates chemotherapy. Keytruda added on 03/26/23  Proceed with carboplatin/Taxol/bevacizumab/Keytruda  repeat CT scan prior to next chemo

## 2023-04-16 NOTE — Telephone Encounter (Signed)
Contacted [facility] to follow up on Pleurx cath supply request. Rep confirmed that they did receive order for supplies and pt will be contacted.   [facility] Ph: [phone] Fax: [phone]

## 2023-04-29 ENCOUNTER — Ambulatory Visit: Payer: Medicare Other

## 2023-05-06 MED FILL — Fosaprepitant Dimeglumine For IV Infusion 150 MG (Base Eq): INTRAVENOUS | Qty: 5 | Status: AC

## 2023-05-07 ENCOUNTER — Encounter: Payer: Self-pay | Admitting: Oncology

## 2023-05-07 ENCOUNTER — Inpatient Hospital Stay: Payer: Medicare Other

## 2023-05-07 ENCOUNTER — Inpatient Hospital Stay: Payer: Medicare Other | Attending: Oncology

## 2023-05-07 ENCOUNTER — Inpatient Hospital Stay (HOSPITAL_BASED_OUTPATIENT_CLINIC_OR_DEPARTMENT_OTHER): Payer: Medicare Other | Admitting: Oncology

## 2023-05-07 VITALS — BP 121/65 | HR 98 | Temp 96.8°F | Resp 18 | Wt 179.6 lb

## 2023-05-07 DIAGNOSIS — C3491 Malignant neoplasm of unspecified part of right bronchus or lung: Secondary | ICD-10-CM

## 2023-05-07 DIAGNOSIS — Z7901 Long term (current) use of anticoagulants: Secondary | ICD-10-CM | POA: Diagnosis not present

## 2023-05-07 DIAGNOSIS — Z9221 Personal history of antineoplastic chemotherapy: Secondary | ICD-10-CM | POA: Diagnosis not present

## 2023-05-07 DIAGNOSIS — R634 Abnormal weight loss: Secondary | ICD-10-CM | POA: Diagnosis not present

## 2023-05-07 DIAGNOSIS — J910 Malignant pleural effusion: Secondary | ICD-10-CM | POA: Diagnosis not present

## 2023-05-07 DIAGNOSIS — Z5111 Encounter for antineoplastic chemotherapy: Secondary | ICD-10-CM | POA: Insufficient documentation

## 2023-05-07 DIAGNOSIS — C771 Secondary and unspecified malignant neoplasm of intrathoracic lymph nodes: Secondary | ICD-10-CM | POA: Insufficient documentation

## 2023-05-07 DIAGNOSIS — Z5112 Encounter for antineoplastic immunotherapy: Secondary | ICD-10-CM | POA: Diagnosis present

## 2023-05-07 DIAGNOSIS — Z87891 Personal history of nicotine dependence: Secondary | ICD-10-CM | POA: Insufficient documentation

## 2023-05-07 DIAGNOSIS — E876 Hypokalemia: Secondary | ICD-10-CM | POA: Insufficient documentation

## 2023-05-07 DIAGNOSIS — Z79899 Other long term (current) drug therapy: Secondary | ICD-10-CM | POA: Diagnosis not present

## 2023-05-07 DIAGNOSIS — Z7982 Long term (current) use of aspirin: Secondary | ICD-10-CM | POA: Insufficient documentation

## 2023-05-07 DIAGNOSIS — J90 Pleural effusion, not elsewhere classified: Secondary | ICD-10-CM

## 2023-05-07 DIAGNOSIS — C3411 Malignant neoplasm of upper lobe, right bronchus or lung: Secondary | ICD-10-CM | POA: Diagnosis present

## 2023-05-07 DIAGNOSIS — Z923 Personal history of irradiation: Secondary | ICD-10-CM | POA: Insufficient documentation

## 2023-05-07 LAB — CBC WITH DIFFERENTIAL (CANCER CENTER ONLY)
Abs Immature Granulocytes: 0.03 10*3/uL (ref 0.00–0.07)
Basophils Absolute: 0 10*3/uL (ref 0.0–0.1)
Basophils Relative: 0 %
Eosinophils Absolute: 0 10*3/uL (ref 0.0–0.5)
Eosinophils Relative: 0 %
HCT: 32.6 % — ABNORMAL LOW (ref 39.0–52.0)
Hemoglobin: 10.6 g/dL — ABNORMAL LOW (ref 13.0–17.0)
Immature Granulocytes: 0 %
Lymphocytes Relative: 5 %
Lymphs Abs: 0.4 10*3/uL — ABNORMAL LOW (ref 0.7–4.0)
MCH: 29.6 pg (ref 26.0–34.0)
MCHC: 32.5 g/dL (ref 30.0–36.0)
MCV: 91.1 fL (ref 80.0–100.0)
Monocytes Absolute: 0.6 10*3/uL (ref 0.1–1.0)
Monocytes Relative: 8 %
Neutro Abs: 7 10*3/uL (ref 1.7–7.7)
Neutrophils Relative %: 87 %
Platelet Count: 176 10*3/uL (ref 150–400)
RBC: 3.58 MIL/uL — ABNORMAL LOW (ref 4.22–5.81)
RDW: 16.7 % — ABNORMAL HIGH (ref 11.5–15.5)
WBC Count: 8.1 10*3/uL (ref 4.0–10.5)
nRBC: 0 % (ref 0.0–0.2)

## 2023-05-07 LAB — CMP (CANCER CENTER ONLY)
ALT: 14 U/L (ref 0–44)
AST: 17 U/L (ref 15–41)
Albumin: 2.7 g/dL — ABNORMAL LOW (ref 3.5–5.0)
Alkaline Phosphatase: 50 U/L (ref 38–126)
Anion gap: 9 (ref 5–15)
BUN: 16 mg/dL (ref 8–23)
CO2: 27 mmol/L (ref 22–32)
Calcium: 9.2 mg/dL (ref 8.9–10.3)
Chloride: 100 mmol/L (ref 98–111)
Creatinine: 0.69 mg/dL (ref 0.61–1.24)
GFR, Estimated: >60 mL/min (ref >60)
Glucose, Bld: 171 mg/dL — ABNORMAL HIGH (ref 70–99)
Potassium: 3.6 mmol/L (ref 3.5–5.1)
Sodium: 136 mmol/L (ref 135–145)
Total Bilirubin: 0.4 mg/dL (ref <1.2)
Total Protein: 6.9 g/dL (ref 6.5–8.1)

## 2023-05-07 LAB — TSH: TSH: 3.084 u[IU]/mL (ref 0.350–4.500)

## 2023-05-07 LAB — PROTEIN, URINE, RANDOM: Total Protein, Urine: 32 mg/dL

## 2023-05-07 MED ORDER — SODIUM CHLORIDE 0.9 % IV SOLN
Freq: Once | INTRAVENOUS | Status: AC
Start: 2023-05-07 — End: 2023-05-07
  Filled 2023-05-07: qty 250

## 2023-05-07 MED ORDER — SODIUM CHLORIDE 0.9 % IV SOLN
150.0000 mg | Freq: Once | INTRAVENOUS | Status: AC
  Administered 2023-05-07: 150 mg via INTRAVENOUS
  Filled 2023-05-07: qty 150

## 2023-05-07 MED ORDER — SODIUM CHLORIDE 0.9 % IV SOLN
145.0000 mg/m2 | Freq: Once | INTRAVENOUS | Status: AC
Start: 2023-05-07 — End: 2023-05-07
  Administered 2023-05-07: 300 mg via INTRAVENOUS
  Filled 2023-05-07: qty 50

## 2023-05-07 MED ORDER — GUAIFENESIN-CODEINE 100-10 MG/5ML PO SYRP: 5.0000 mL | ORAL_SOLUTION | Freq: Three times a day (TID) | ORAL | 0 refills | Status: DC | PRN

## 2023-05-07 MED ORDER — FAMOTIDINE IN NACL 20-0.9 MG/50ML-% IV SOLN
20.0000 mg | Freq: Once | INTRAVENOUS | Status: AC
  Administered 2023-05-07: 20 mg via INTRAVENOUS
  Filled 2023-05-07: qty 50

## 2023-05-07 MED ORDER — HEPARIN SOD (PORK) LOCK FLUSH 100 UNIT/ML IV SOLN
500.0000 [IU] | Freq: Once | INTRAVENOUS | Status: AC | PRN
Start: 2023-05-07 — End: 2023-05-07
  Administered 2023-05-07: 500 [IU]
  Filled 2023-05-07: qty 5

## 2023-05-07 MED ORDER — DEXAMETHASONE SODIUM PHOSPHATE 10 MG/ML IJ SOLN
10.0000 mg | Freq: Once | INTRAMUSCULAR | Status: AC
  Administered 2023-05-07: 10 mg via INTRAVENOUS
  Filled 2023-05-07: qty 1

## 2023-05-07 MED ORDER — SODIUM CHLORIDE 0.9 % IV SOLN
522.5000 mg | Freq: Once | INTRAVENOUS | Status: AC
  Administered 2023-05-07: 520 mg via INTRAVENOUS
  Filled 2023-05-07: qty 52

## 2023-05-07 MED ORDER — BEVACIZUMAB-AWWB CHEMO INJECTION 400 MG/16ML
15.0000 mg/kg | Freq: Once | INTRAVENOUS | Status: AC
  Administered 2023-05-07: 1200 mg via INTRAVENOUS
  Filled 2023-05-07: qty 48

## 2023-05-07 MED ORDER — DIPHENHYDRAMINE HCL 50 MG/ML IJ SOLN
50.0000 mg | Freq: Once | INTRAMUSCULAR | Status: AC
  Administered 2023-05-07: 50 mg via INTRAVENOUS
  Filled 2023-05-07: qty 1

## 2023-05-07 MED ORDER — PALONOSETRON HCL INJECTION 0.25 MG/5ML
0.2500 mg | Freq: Once | INTRAVENOUS | Status: AC
  Administered 2023-05-07: 0.25 mg via INTRAVENOUS
  Filled 2023-05-07: qty 5

## 2023-05-07 MED ORDER — PEMBROLIZUMAB CHEMO INJECTION 100 MG/4ML
200.0000 mg | Freq: Once | INTRAVENOUS | Status: AC
  Administered 2023-05-07: 200 mg via INTRAVENOUS
  Filled 2023-05-07: qty 200

## 2023-05-07 NOTE — Patient Instructions (Signed)
[facility] - A DEPT OF [facility]  Discharge Instructions: Thank you for choosing [facility] to provide your oncology and hematology care.  If you have a lab appointment with the Cancer Center, please go directly to the Cancer Center and check in at the registration area.  Wear comfortable clothing and clothing appropriate for easy access to any Portacath or PICC line.   We strive to give you quality time with your provider. You may need to reschedule your appointment if you arrive late (15 or more minutes).  Arriving late affects you and other patients whose appointments are after yours.  Also, if you miss three or more appointments without notifying the office, you may be dismissed from the clinic at the provider's discretion.      For prescription refill requests, have your pharmacy contact our office and allow 72 hours for refills to be completed.    To help prevent nausea and vomiting after your treatment, we encourage you to take your nausea medication as directed.  BELOW ARE SYMPTOMS THAT SHOULD BE REPORTED IMMEDIATELY: *FEVER GREATER THAN 100.4 F (38 C) OR HIGHER *CHILLS OR SWEATING *NAUSEA AND VOMITING THAT IS NOT CONTROLLED WITH YOUR NAUSEA MEDICATION *UNUSUAL SHORTNESS OF BREATH *UNUSUAL BRUISING OR BLEEDING *URINARY PROBLEMS (pain or burning when urinating, or frequent urination) *BOWEL PROBLEMS (unusual diarrhea, constipation, pain near the anus) TENDERNESS IN MOUTH AND THROAT WITH OR WITHOUT PRESENCE OF ULCERS (sore throat, sores in mouth, or a toothache) UNUSUAL RASH, SWELLING OR PAIN  UNUSUAL VAGINAL DISCHARGE OR ITCHING   Items with * indicate a potential emergency and should be followed up as soon as possible or go to the Emergency Department if any problems should occur.  Please show the CHEMOTHERAPY ALERT CARD or IMMUNOTHERAPY ALERT CARD at check-in to the Emergency Department and triage nurse.  Should you have  questions after your visit or need to cancel or reschedule your appointment, please contact [facility] - A DEPT OF [facility]  [phone] and follow the prompts.  Office hours are 8:00 a.m. to 4:30 p.m. Monday - Friday. Please note that voicemails left after 4:00 p.m. may not be returned until the following business day.  We are closed weekends and major holidays. You have access to a nurse at all times for urgent questions. Please call the main number to the clinic [phone] and follow the prompts.  For any non-urgent questions, you may also contact your provider using MyChart. We now offer e-Visits for anyone 57 and older to request care online for non-urgent symptoms. For details visit [email or web address].   Also download the MyChart app! Go to the app store, search "MyChart", open the app, select [place], and log in with your MyChart username and password.

## 2023-05-07 NOTE — Assessment & Plan Note (Signed)
Follow up with nutritionist  Recommend nutrition supplements.  Recommend Megace 80mg  BID

## 2023-05-07 NOTE — Assessment & Plan Note (Signed)
Palliative therapeutic thoracentesis as needed.  Pleural effusion has not responded to chemotherapy.  S/p pleural catheter placement. Drain fluid PRN

## 2023-05-07 NOTE — Progress Notes (Signed)
Hematology/Oncology Progress note [phone] [phone]       Patient Care Team: [name], NP as PCP - General (Family Medicine) [name], RN as Registered Nurse [name], MD as Consulting Physician (Oncology) [name], MD as Referring Physician (Radiation Oncology) Annice Needy, MD as Referring Physician (Vascular Surgery)   REASON FOR VISIT History of  stage IIIA right lung adenocarcinoma, and presumed stage I left lung cancer, now recurrent Stage IV lung adenocarcinoma, follow up for chemotherapy.    ASSESSMENT & PLAN:   Cancer Staging  Primary lung adenocarcinoma, right [facility]) Staging form: Lung, AJCC 8th Edition - Clinical stage from 12/13/2017: Stage IIIA (cT3, cN1, cM0) - Signed by [name], MD on 12/13/2017 - Pathologic stage from 12/24/2022: Stage IV (pTX, pNX, pM1) - Signed by [name], MD on 12/24/2022   Primary lung adenocarcinoma, right (HCC) #History of stage IIIA cT3 N1 M0 lung adenocarcinoma, s/p concurrent chemoradiation [Aug 2019].  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 No actionable, TMB 18.9 m/mb, MSS, PD-L1 <1%  On palliative chemotherapy with carboplatin/Taxol/bevacizumab.   No actionable mutation on NGS. PDL-1<1%, TMB 18.9   Labs are reviewed and discussed with patient. Overall he tolerates chemotherapy. Keytruda added on 03/26/23  Proceed with carboplatin/Taxol/bevacizumab/Keytruda  Will reschedule CT scan prior to next chemo    Pleural effusion Palliative therapeutic thoracentesis as needed.  Pleural effusion has not responded to chemotherapy.  S/p pleural catheter placement. Drain fluid  PRN  Encounter for antineoplastic chemotherapy Treatment plan as listed above.   Weight loss Follow up with nutritionist  Recommend nutrition supplements.  Recommend Megace 80mg  BID  Hypokalemia Continue potassium chloride daily.   Orders Placed This Encounter  Procedures   Protein, urine, random    Standing Status:   Future    Standing Expiration Date:   06/17/2024   CBC with Differential (Cancer Center Only)    Standing Status:   Future    Standing Expiration Date:   06/17/2024   CMP (Cancer Center only)    Standing Status:   Future    Standing Expiration Date:   06/17/2024   Protein, urine, random    Standing Status:   Future    Standing Expiration Date:   07/08/2024   CBC with Differential (Cancer Center Only)    Standing Status:   Future    Standing Expiration Date:   07/08/2024   CMP (Cancer Center only)    Standing Status:   Future    Standing Expiration Date:   07/08/2024   Follow up  3 weeks  lab MD  All questions were answered. The patient knows to call the clinic with any problems, questions or concerns.  [name], MD, PhD [facility] Hematology Oncology 05/07/2023      Oncology History:  DEZMON CONOVER is a  72 y.o.  male with presents for stage IIIA right lung adenocarcinoma, and presumed stage I left lung cancer now with stage IV recurrent lung adenocarcinoma. Former 43 pack year smoking history.   Oncology History  Primary lung adenocarcinoma, right (HCC)  12/13/2017 Initial Diagnosis   cT3 N1 M0 right upper lobe lung adenocarcinoma  August 2019 Finished  concurrent chemoradiation.  03/17/2019 Finished 1 year of durvalumab treatment.   12/13/2017 Cancer Staging   Staging form: Lung, AJCC 8th Edition - Clinical stage from 12/13/2017: Stage IIIA (cT3, cN1, cM0) - Signed by [name], MD on 12/13/2017   12/31/2017 - 02/19/2018 Chemotherapy   Concurrent chemotherapy carboplatin and Taxol. With radiation   03/24/2018 - 03/17/2019 Chemotherapy   LUNG  DURVALUMAB Q14D x 1 year     12/01/2020 Imaging   PET  1. The 1.1 cm in long axis left apical nodule has maximum SUV of 2.2. Given the progression in size over the last year, the appearance is suspicious for small/low-grade adenocarcinoma.  2. Upper normal sized AP window lymph nodes have maximum SUV just above blood pool, and are nonspecific.   Tumor board discussion 12/29/20  Slow-growing left upper lobe nodule.  CT-guided biopsy and biopsy via bronchoscopy are both of high risk due to underlying emphysema.  Consensus reached upon continue monitoring and repeat CT in 3 months.  If nodule continues to grow, he may benefit from empiric SBRT     03/13/2021 Imaging   CT without contrast showed slightly increase of left upper lobe nodule 12x64mm versus 11 x 8 mm.  Slightly increasing size.  Remains suspicious for bronchogenic neoplasm. Subtle nodularity along the posterior left upper lobe approximately 6 mm is unchanged.  Posttreatment changes distort the right hilum are unchanged.   04/20/2021 - 04/25/2021 Radiation Therapy   Patient received radiation to left upper lobe  nodule for presumed Stage I left upper lobe lung cancer.    07/18/2021 Imaging    CT chest without contrast showed stable posttreatment changes within the right lung.  No findings to suggest recurrent tumor or metastatic disease.  Interval decrease in size of previously characterized irregular nodule in the left upper lobe.    10/18/2021 Imaging    CT chest showed a stable examination of chronic postradiation masslike fibrosis in the right lung. Persistent subsolid nodule in the superior segment of the left lower lobe 1.3 x 1.2 cm.  Nonspecific.  COPD findings, aortic atherosclerosis.  Coronary artery disease.   01/17/2022 Imaging   01/17/2022, CT chest without contrast showed subsolid area in the left lower lobe is similar to prior imaging however with increasing nodularity.  Recommend PET scan for further evaluation Perihilar  parenchymal destruction and scarring from prior radiation in the right hilum extending into the right upper and lower lobe unchanged. Left upper lobe parenchymal scarring likely reflects treatment changes.  Marked pulmonary emphysema in the background.  Aortic atherosclerosis.   02/06/2022 Imaging   PET restaging 1. Focus of increasing nodularity in the subpleural left lower lobe identified as concerning on the recent CT chest shows only minimal FDG uptake on PET imaging today. While this is reassuring and the finding may reflect nfectious/inflammatory etiology, low-grade or well differentiated neoplasm can be poorly FDG avid. Close continued follow-up recommended. 2. Stable perifissural left lower lobe sub solid nodule, also with low level FDG uptake. Continued attention on follow-up recommende    05/17/2022 Imaging   1. Stable perihilar post radiation change and volume loss in the RIGHT hemithorax. 2. Stable mild mediastinal lymphadenopathy. 3. Peripheral nodule in the LEFT lower lobe measures slightly larger but has benign morphology. 4. Stable thickening RIGHT adrenal gland. 5. No new or progressive lung cancer identified   11/16/2022 Imaging   CT chest abdomen pelvis w contrast showed 1. New moderate right pleural effusion with diffuse, nodular,enhancing pleural thickening consistent with pleural metastatic disease. 2.  Proximal obstruction of the right upper lobe segmental bronchi with complete atelectasis or consolidation of the right upper lobe, consistent with local malignant recurrence and postobstructive atelectasis of the upper lobe. 3. Interval increase in circumferential soft tissue thickening about the right hilum. Slight interval increase in size in enlarged AP window lymph nodes. 4. Interval enlargement of a subpleural mass of the dependent superior segment left lower lobe consistent with an enlarging pulmonary metastasis or metachronous primary lung malignancy.5. Emphysema. 6.  Coronary artery disease. 7. Prostatomegaly.    11/20/2022 Procedure   Thoracentesis right side Pathology is positive for adenocarcinoma.  Insufficient material for ancillary medical testing.    12/03/2022 Imaging   PET scan showed 1. Hypermetabolic mass at the RIGHT hilum consistent with bronchogenic carcinoma. 2. Postobstructive collapse of the RIGHT upper lobe. 3. Uniform hypermetabolic pleural thickening in the RIGHT lung consistent with pleural metastasis. 4. Large RIGHT effusion. 5. Hypermetabolic nodule in the superior segment of the LEFT lower lobe consistent with pulmonary metastasis. Synchronous bronchogenic carcinoma versus metastatic lesion. 6. Hypermetabolic bilateral mediastinal adenopathy consistent with nodal metastasis. 7. No evidence of metastatic disease outside the thorax. 8. No skeletal metastasis.      12/17/2022 Procedure   Patient underwent biopsy via bronchoscopy for additional tissue Pathology showed  Right upper lobe lavage-positive for non-small cell carcinoma Right upper lobe ENB assisted biopsy showed adenocarcinoma, acinar and solid patterns. Right upper lobe FNA positive for non-small cell carcinoma.  [facility] NGS/PD-L1 showed KEAP1 missense, TP53 missense, SMARCA4 frameshift, PTPRD copy loss, no EGFR, KRAS, BRAF, ALK, ROS1 RET MET ERBB mutations.  TMB 18.9 m/mb, MSS, PD-L1 <1%    12/24/2022 Cancer Staging   Staging form: Lung, AJCC 8th Edition - Pathologic stage from 12/24/2022: Stage IV (pTX, pNX, pM1) - Signed by [name], MD on 12/24/2022 Stage prefix: Initial diagnosis   01/01/2023 -  Chemotherapy   Patient is on Treatment Plan : LUNG NSCLC Carboplatin + Paclitaxel + Bevacizumab q21d     02/23/2025 Imaging   CT chest w contrast  Right upper lobe collapse with known central right upper lobe/endobronchial mass, poorly visualized.   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 Moderate to large malignant right  pleural effusion, mildly improved. Adjacent right juxta diaphragmatic nodal metastases, improved. Malpositioned right chest port, as above, unchanged. Aortic Atherosclerosis (ICD10-I70.0) and Emphysema (ICD10-J43.9).     Multiple  liver nodules,  07/11/2020 MRI liver with and without contrast showed stable hypervascular lesions in the liver parenchyma without new or progressive findings.  Lesions remain most suggestive of benign etiology.  PET scan did not show evaluation activities.  proteous mirabilis UTI, s/p a course bactrim, improved symptoms   INTERVAL HISTORY HOYLE BARKDULL is a 72 y.o. male who has above history reviewed by me presents for follow-up of recurrent stage IV non-small cell lung cancer.  + SOB, better since pleurx placement on 03/21/23. He gets pleurx drainage every other day. No nausea vomiting diarrhea.  Appetite is not good, weight is stable. CT was not done. No showed.  + mid chest pain with cough.  + weight losss   . Review of Systems  Constitutional:  Positive for appetite change, fatigue and unexpected weight change. Negative for chills and fever.  [name]:   Negative for hearing loss and voice change.   Eyes:  Negative for eye problems and icterus.  Respiratory:  Positive for shortness of breath. Negative for chest tightness and cough.   Cardiovascular:  Negative for leg  swelling.       Mid chest pain with cough  Gastrointestinal:  Negative for abdominal distention and abdominal pain.  Endocrine: Negative for hot flashes.  Genitourinary:  Negative for difficulty urinating, dysuria and frequency.   Musculoskeletal:  Negative for arthralgias.  Skin:  Negative for itching and rash.  Neurological:  Negative for headaches, light-headedness and numbness.  [name]:  Negative for adenopathy. Does not bruise/bleed easily.  Psychiatric/Behavioral:  Negative for confusion.      MEDICAL HISTORY:  Past Medical History:  Diagnosis Date   Abnormal nuclear  stress test    Anxiety    a.) on BZO (lorazepam) PRN   Asthma    Avascular necrosis of femoral head (HCC)    BPH (benign prostatic hyperplasia)    Cataract    a.) s/p extraction on LEFT   Colon adenomas    COPD (chronic obstructive pulmonary disease) (HCC)    Coronary artery disease 11/11/2014   a.) MPI 11/11/2014: EF 56%, mild inf wall ischemia; b.) LHC 12/16/2014: 20% mRCA - med mgmt   DDD (degenerative disc disease), lumbar    Diverticulosis    DM type 2 (diabetes mellitus, type 2) (HCC)    Duodenitis    Dysphagia    Dyspnea    ED (erectile dysfunction)    a.) on PDE5i (sildenafil) PRN   Elevated PSA    Former smoker    Gastritis    GERD (gastroesophageal reflux disease)    [name]    Hilar mass    History of hiatal hernia    Hyperlipidemia    Hypertension    Hyperuricemia    Hypokalemia    Left ventricular diastolic dysfunction 10/04/2016   a.) TTE 10/04/2016: EF 50%, mild MR/TR/PR, mod AR, G1DD; b.) TTE 10/10/2017: EF 45%, triv PR, mild AR/MR/TR, G1DD; c.) TTE 03/30/2020: EF 50%, mild-mod AR, mild MR/TR/PR; d.) TTE 04/23/2022: EF >55%, mild LVH, mild LAE, triv TR, mild PR, mod AR/MR   Liver lesion    Obesity    Pedal edema    Peripheral polyneuropathy    Peyronie's disease 05/2022   Pleural effusion    Primary lung adenocarcinoma, right (HCC)    a.) stage IIIA (cT3, cN1, cM0) --> s/p concurrent chemoradiation 2019 --> carboplatin + pacilitaxel (12/31/2017 - 02/19/2018) followed by 1 year (03/24/2018 - 03/17/2019) adjuvant anti-PDL-1 mAB immunotherapy (durvalumab)   [name] (peptic ulcer disease)    Renal insufficiency    Umbilical hernia    [name]     SURGICAL HISTORY: Past Surgical History:  Procedure Laterality Date   CARDIAC CATHETERIZATION N/A 12/16/2014   Procedure: Left Heart Cath;  Surgeon: Marcina Millard, MD;  Location: [facility];  Service: Cardiovascular;  Laterality: N/A;   CARDIOVERSION N/A 01/17/2023    Procedure: CARDIOVERSION;  Surgeon: Clotilde Dieter, DO;  Location: [name];  Service: Cardiovascular;  Laterality: N/A;   CATARACT EXTRACTION Left    COLONOSCOPY     COLONOSCOPY WITH PROPOFOL N/A 01/14/2020   Procedure: COLONOSCOPY WITH PROPOFOL;  Surgeon: Toledo, Boykin Nearing, MD;  Location: [facility];  Service: Gastroenterology;  Laterality: N/A;   CYST REMOVAL TRUNK     chest and back over time and it was removed   CYSTECTOMY     ENDOBRONCHIAL ULTRASOUND N/A 12/09/2017   Procedure: ENDOBRONCHIAL ULTRASOUND;  Surgeon: Shane Crutch, MD;  Location: [name];  Service: Pulmonary;  Laterality: N/A;   IR GUIDED DRAIN W CATHETER PLACEMENT  03/21/2023   PORTA CATH INSERTION N/A  12/23/2017   Procedure: PORTA CATH INSERTION;  Surgeon: Annice Needy, MD;  Location: [facility];  Service: Cardiovascular;  Laterality: N/A;   PORTA CATH INSERTION N/A 01/08/2023   Procedure: PORTA CATH INSERTION;  Surgeon: Annice Needy, MD;  Location: [facility];  Service: Cardiovascular;  Laterality: N/A;   PORTA CATH REMOVAL N/A 06/04/2022   Procedure: PORTA CATH REMOVAL;  Surgeon: Annice Needy, MD;  Location: [facility];  Service: Cardiovascular;  Laterality: N/A;   TEE WITHOUT CARDIOVERSION N/A 01/17/2023   Procedure: TRANSESOPHAGEAL ECHOCARDIOGRAM (TEE);  Surgeon: Clotilde Dieter, DO;  Location: [name];  Service: Cardiovascular;  Laterality: N/A;   UPPER GI ENDOSCOPY     VIDEO BRONCHOSCOPY WITH ENDOBRONCHIAL ULTRASOUND N/A 12/17/2022   Procedure: VIDEO BRONCHOSCOPY WITH ENDOBRONCHIAL ULTRASOUND;  Surgeon: Vida Rigger, MD;  Location: [name];  Service: Thoracic;  Laterality: N/A;    SOCIAL HISTORY: Social History   Socioeconomic History   Marital status: Divorced    Spouse name: Not on file   Number of children: Not on file   Years of education: Not on file   Highest education level: Not on file  Occupational History   Not on file  Tobacco Use   Smoking  status: Former    Current packs/day: 0.00    Average packs/day: 1.5 packs/day for 35.0 years (52.5 ttl pk-yrs)    Types: Cigarettes    Start date: 06/21/1980    Quit date: 06/22/2015    Years since quitting: 7.8    Passive exposure: Past   Smokeless tobacco: Never  Vaping Use   Vaping status: Never Used  Substance and Sexual Activity   Alcohol use: Not Currently    Comment: weekends   Drug use: No   Sexual activity: Not Currently  Other Topics Concern   Not on file  Social History Narrative   Lives with son [name].   Social Determinants of Health   Financial Resource Strain: Not on file  Food Insecurity: Not on file  Transportation Needs: Not on file  Physical Activity: Not on file  Stress: Not on file  Social Connections: Unknown (11/14/2021)   Received from [facility], [facility]   Social Network    Social Network: Not on file  Intimate Partner Violence: Unknown (10/05/2021)   Received from [facility], [facility]   HITS    Physically Hurt: Not on file    Insult or Talk Down To: Not on file    Threaten Physical Harm: Not on file    Scream or Curse: Not on file    FAMILY HISTORY: Family History  Problem Relation Age of Onset   Hypertension Mother    Breast cancer Mother    Heart attack Mother    Lung cancer Father    Heart disease Sister    Diabetes Sister    Colon cancer Sister     ALLERGIES:  is allergic to penicillins.  MEDICATIONS:  Current Outpatient Medications  Medication Sig Dispense Refill   acetaminophen (TYLENOL) 500 MG tablet Take 500 mg by mouth as needed.     albuterol (VENTOLIN HFA) 108 (90 Base) MCG/ACT inhaler Inhale 2 puffs into the lungs every 4 (four) hours as needed.     amLODipine (NORVASC) 10 MG tablet Take 10 mg by mouth every morning.     apixaban (ELIQUIS) 5 MG TABS tablet Take 5 mg by mouth 2 (two) times daily.     ascorbic acid (VITAMIN C) 1000 MG  tablet Take 1,000 mg by mouth daily.     aspirin 81 MG tablet Take 81  mg by mouth daily.     cetirizine (ZYRTEC) 10 MG tablet Take 1 tablet by mouth as needed.     cholecalciferol (VITAMIN D3) 25 MCG (1000 UNIT) tablet Take 1,000 Units by mouth daily.     dexamethasone (DECADRON) 4 MG tablet Take 2 tablets (8mg ) by mouth daily starting the day after carboplatin for 3 days. Take with food 30 tablet 1   fluticasone (FLONASE) 50 MCG/ACT nasal spray Place 1 spray into both nostrils daily.     Fluticasone-Umeclidin-Vilant (TRELEGY ELLIPTA) 200-62.5-25 MCG/ACT AEPB Inhale 1 puff into the lungs every morning.     gabapentin (NEURONTIN) 300 MG capsule Take 300 mg by mouth at bedtime as needed.      guaiFENesin-codeine (ROBITUSSIN AC) 100-10 MG/5ML syrup Take 5 mLs by mouth 3 (three) times daily as needed for cough. 473 mL 0   ibuprofen (ADVIL,MOTRIN) 600 MG tablet Take 600 mg by mouth every 6 (six) hours as needed.     ipratropium-albuterol (DUONEB) 0.5-2.5 (3) MG/3ML SOLN Take 3 mLs by nebulization every 6 (six) hours as needed.     lidocaine-prilocaine (EMLA) cream Apply to affected area once 30 g 3   lisinopril (PRINIVIL,ZESTRIL) 40 MG tablet Take 40 mg by mouth every morning.     LORazepam (ATIVAN) 0.5 MG tablet Take 1 tablet (0.5 mg total) by mouth every 12 (twelve) hours as needed for anxiety. 30 tablet 0   megestrol (MEGACE) 40 MG tablet Take 2 tablets (80 mg total) by mouth 2 (two) times daily. 120 tablet 1   metoprolol tartrate (LOPRESSOR) 50 MG tablet Take 50 mg by mouth 2 (two) times daily.     mirabegron ER (MYRBETRIQ) 50 MG TB24 tablet Take 1 tablet (50 mg total) by mouth daily. 30 tablet 11   mupirocin ointment (BACTROBAN) 2 % Apply 1 Application topically daily. 22 g 3   ondansetron (ZOFRAN) 8 MG tablet Take 1 tablet (8 mg total) by mouth every 8 (eight) hours as needed for nausea or vomiting. Start on the third day after carboplatin. 30 tablet 1   potassium chloride (KLOR-CON) 10 MEQ tablet Take 1 tablet (10 mEq total) by mouth daily. 90 tablet 1    pravastatin (PRAVACHOL) 20 MG tablet Take 20 mg by mouth at bedtime.     prochlorperazine (COMPAZINE) 10 MG tablet Take 1 tablet (10 mg total) by mouth every 6 (six) hours as needed for nausea or vomiting. 90 tablet 3   senna-docusate (SENOKOT-S) 8.6-50 MG tablet Take 2 tablets by mouth daily. 60 tablet 2   spironolactone (ALDACTONE) 25 MG tablet Take by mouth.     tamsulosin (FLOMAX) 0.4 MG CAPS capsule Take 0.4 mg by mouth daily after breakfast.     torsemide (DEMADEX) 20 MG tablet Take 20 mg by mouth every morning.     No current facility-administered medications for this visit.   Facility-Administered Medications Ordered in Other Visits  Medication Dose Route Frequency Provider Last Rate Last Admin   bevacizumab-awwb (MVASI) 1,200 mg in sodium chloride 0.9 % 100 mL chemo infusion  15 mg/kg (Order-Specific) Intravenous Once [name], MD       CARBOplatin (PARAPLATIN) 520 mg in sodium chloride 0.9 % 250 mL chemo infusion  520 mg Intravenous Once [name], MD       PACLitaxel (TAXOL) 300 mg in sodium chloride 0.9 % 250 mL chemo infusion (> 80mg /m2)  145  mg/m2 (Order-Specific) Intravenous Once [name], MD 100 mL/hr at 05/07/23 1051 300 mg at 05/07/23 1051   sodium chloride flush (NS) 0.9 % injection 10 mL  10 mL Intravenous PRN [name], MD   10 mL at 03/18/18 0858     PHYSICAL EXAMINATION: ECOG PERFORMANCE STATUS: 0 - Asymptomatic Vitals:   05/07/23 0825  BP: 121/65  Pulse: 98  Resp: 18  Temp: (!) 96.8 F (36 C)  SpO2: 100%   Filed Weights   05/07/23 0825  Weight: 179 lb 9.6 oz (81.5 kg)    Physical Exam Constitutional:      General: He is not in acute distress. [name]:     Head: Normocephalic and atraumatic.  Eyes:     General: No scleral icterus.    Pupils: Pupils are equal, round, and reactive to light.  Cardiovascular:     Rate and Rhythm: Normal rate and regular rhythm.     Heart sounds: Normal heart sounds.  Pulmonary:     Effort: Pulmonary effort is normal. No  respiratory distress.     Comments: Decreased breath sound bilaterally Abdominal:     General: Bowel sounds are normal. There is no distension.     Palpations: Abdomen is soft.  Musculoskeletal:        General: No deformity. Normal range of motion.     Cervical back: Normal range of motion and neck supple.  Lymphadenopathy:     Cervical: No cervical adenopathy.  Skin:    General: Skin is warm and dry.     Findings: No rash.     Comments: Left supraclavicular fossa fullness, chronic. Right chest wall medi port with surrounding bruising, small area of skin opening.  no discharge or tenderness.   Neurological:     Mental Status: He is alert and oriented to person, place, and time. Mental status is at baseline.     Cranial Nerves: No cranial nerve deficit.      LABORATORY DATA:  I have reviewed the data as listed    Latest Ref Rng & Units 05/07/2023    8:11 AM 04/16/2023    8:27 AM 03/26/2023    7:57 AM  CBC  WBC 4.0 - 10.5 K/uL 8.1  9.8  7.6   Hemoglobin 13.0 - 17.0 g/dL 16.1  09.6  04.5   Hematocrit 39.0 - 52.0 % 32.6  31.9  32.4   Platelets 150 - 400 K/uL 176  261  249       Latest Ref Rng & Units 05/07/2023    8:11 AM 04/16/2023    8:27 AM 03/26/2023    7:57 AM  CMP  Glucose 70 - 99 mg/dL 409  811  914   BUN 8 - 23 mg/dL 16  13  10    Creatinine 0.61 - 1.24 mg/dL 7.82  9.56  2.13   Sodium 135 - 145 mmol/L 136  135  138   Potassium 3.5 - 5.1 mmol/L 3.6  3.5  3.7   Chloride 98 - 111 mmol/L 100  101  101   CO2 22 - 32 mmol/L 27  28  31    Calcium 8.9 - 10.3 mg/dL 9.2  9.1  9.1   Total Protein 6.5 - 8.1 g/dL 6.9  6.8  6.5   Total Bilirubin <1.2 mg/dL 0.4  0.4  0.6   Alkaline Phos 38 - 126 U/L 50  48  54   AST 15 - 41 U/L 17  18  17    ALT  0 - 44 U/L 14  12  12       RADIOGRAPHIC STUDIES: I have personally reviewed the radiological images as listed and agreed with the findings in the report.Hardie Shackleton W Catheter Placement  Result Date: 03/21/2023 INDICATION:  72 year old male with a history of malignancy referred for right-sided pleural PleurX EXAM: IMAGE GUIDED TUNNELED PLEURAL DRAINAGE CATHETER MEDICATIONS: 1 g vancomycin the antibiotics were administered within an appropriate time frame prior to the initiation of the procedure. ANESTHESIA/SEDATION: Moderate (conscious) sedation was employed during this procedure. A total of Versed 1.0 mg and Fentanyl 50 mcg was administered intravenously by the radiology nurse. Total intra-service moderate Sedation Time: 25 minutes. The patient's level of consciousness and vital signs were monitored continuously by radiology nursing throughout the procedure under my direct supervision. COMPLICATIONS: None PROCEDURE: The procedure, risks, benefits, and alternatives were explained to the patient and the patient's family. Specific risks that were addressed included bleeding, infection, pneumothorax, need for further procedure, chance of delayed pneumothorax or hemorrhage, hemoptysis, cardiopulmonary collapse, death. Questions regarding the procedure were encouraged and answered. The patient understands and consents to the procedure. The right chest wall was prepped with Betadine in a sterile fashion, and a sterile drape was applied covering the operative field. A sterile gown and sterile gloves were used for the procedure. Local anesthesia was provided with 1% Lidocaine. Ultrasound image documentation was performed. After creating a small skin incision, a 19 gauge needle was advanced into the pleural cavity under ultrasound guidance. A guide wire was then advanced under fluoroscopy into the pleural space. Pleural access was dilated serially and a 16-French peel-away sheath placed. The skin and subcutaneous tissues were generously infiltrated with 1% lidocaine from the puncture site over the pleura along the intercostal margin anteriorly. A small stab incision was made with 11 blade scalpel at the insertion site of the catheter, and the  catheter was back tunneled to the site at the pleural puncture. A tunneled care fusion catheter was placed. This was tunneled from the incision 5 cm anterior to the pleural access to the access site. The catheter was advanced through the peel-away sheath. The sheath was then removed. Final catheter positioning was confirmed with a fluoroscopic spot image. The access incision was closed with Dermabond. Dermabond was applied to the catheterization incision. Large volume thoracentesis was performed through the new catheter utilizing gravity drainage bag. The patient tolerated the procedure well and remained hemodynamically stable throughout. No complications were encountered and no significant blood loss was encountered. IMPRESSION: Status post image guided right pleural tunneled drainage catheter. Signed, Yvone Neu. Miachel Roux, RPVI [facility] Radiology Electronically Signed   By: Gilmer Mor D.O.   On: 03/21/2023 14:39   US THORACENTESIS ASP PLEURAL SPACE W/IMG GUIDE  Result Date: 03/08/2023 INDICATION: Recurrent right pleural effusion. Request received for therapeutic thoracentesis EXAM: ULTRASOUND GUIDED RIGHT THORACENTESIS MEDICATIONS: 7 cc 1% lidocaine COMPLICATIONS: None immediate. PROCEDURE: An ultrasound guided thoracentesis was thoroughly discussed with the patient and questions answered. The benefits, risks, alternatives and complications were also discussed. The patient understands and wishes to proceed with the procedure. Written consent was obtained. Ultrasound was performed to localize and mark an adequate pocket of fluid in the right chest. The area was then prepped and draped in the normal sterile fashion. 1% Lidocaine was used for local anesthesia. Under ultrasound guidance a 6 Fr Safe-T-Centesis catheter was introduced. Thoracentesis was performed. The catheter was removed and a dressing applied. FINDINGS:  A total of approximately 1.95 L of  serosanguineous fluid was removed. Ordering provider did not request laboratory samples. IMPRESSION: Successful ultrasound guided right thoracentesis yielding 1.95 L of pleural fluid. Follow-up chest x-ray revealed no evidence of pneumothorax. Procedure performed by Mina Marble, PA-C Electronically Signed   By: Olive Bass M.D.   On: 03/08/2023 15:17   DG Chest Port 1 View  Result Date: 03/08/2023 CLINICAL DATA:  post right thoracentesis EXAM: PORTABLE CHEST 1 VIEW COMPARISON:  Multiple priors FINDINGS: Stable appearance of the cardiomediastinal silhouette with chronic atelectasis of the right upper lobe. Interval improvement in right effusion with residual small to moderate fluid. No pneumothorax. Left lung remains clear. Similar malpositioning of the right-sided chest port with redundant catheter in the right neck and tip of the catheter terminating in the proximal SVC. IMPRESSION: 1. No findings of pneumothorax following right-sided thoracentesis, with small to moderate residual effusion remaining. 2. Similar malpositioning of the right-sided port with redundant catheter in the right neck and tip of the catheter terminating in the proximal SVC. 3. Other chronic findings as detailed. Electronically Signed   By: Olive Bass M.D.   On: 03/08/2023 15:12   CT Chest W Contrast  Result Date: 02/24/2023 CLINICAL DATA:  Recurrent non-small cell right lung cancer, on immunotherapy. EXAM: CT CHEST WITH CONTRAST TECHNIQUE: Multidetector CT imaging of the chest was performed during intravenous contrast administration. RADIATION DOSE REDUCTION: This exam was performed according to the departmental dose-optimization program which includes automated exposure control, adjustment of the mA and/or kV according to patient size and/or use of iterative reconstruction technique. CONTRAST:  75mL OMNIPAQUE IOHEXOL 300 MG/ML  SOLN COMPARISON:  CT chest dated 12/14/2022.  PET-CT dated 12/03/2022. FINDINGS: Cardiovascular:  Heart is normal in size. Trace anterior pericardial effusion. No evidence of thoracic aortic aneurysm. Very mild atherosclerotic calcifications of the aortic arch. Mild coronary atherosclerosis of the LAD. Right chest port is looped in the neck and terminates in the right brachiocephalic vein just below the thoracic inlet (series 2/image 29), unchanged from priors but malpositioned. Mediastinum/Nodes: No suspicious mediastinal lymphadenopathy. Right juxta diaphragmatic/epicardial nodal soft tissue measuring up to 9 mm short axis (series 2/image 109), previously 18 mm, reflecting improving nodal metastases. Visualized thyroid is unremarkable. 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 This is mildly improved from prior CT. Right upper lobe collapse with known central right upper lobe/endobronchial mass (series 2/images 50 and 54), poorly visualized. Moderate to severe centrilobular and paraseptal emphysematous changes, left upper lobe predominant. 16 mm irregular nodule in the posterior left upper lobe (series 3/image 19), non FDG avid, favoring pleural-parenchymal scarring. 1.8 x 2.7 cm nodule in the superior segment left lower lobe (series 3/image 87), previously 2.1 x 2.7 cm, mildly improved. This corresponds to the patient's known contralateral pulmonary metastasis. Compressive atelectasis in the right middle and lower lobes. No pneumothorax. Upper Abdomen: Visualized upper abdomen is grossly unremarkable, noting stable subcentimeter nodular thickening of the left adrenal gland, which was non FDG avid on PET. Vascular calcifications. Musculoskeletal: Degenerative changes of the visualized thoracolumbar spine. Stable sclerotic lesions in the T7 vertebral body and posterior elements at T11, non FDG avid on PET, likely benign bone islands. IMPRESSION:  Right upper lobe collapse with known central right upper lobe/endobronchial mass, poorly visualized. 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 Adjacent  right juxta diaphragmatic nodal metastases, improved. Malpositioned right chest port, as above, unchanged. Aortic Atherosclerosis (ICD10-I70.0) and Emphysema (ICD10-J43.9). Electronically Signed   By: Charline Bills M.D.   On: 02/24/2023 01:12   US THORACENTESIS ASP PLEURAL SPACE W/IMG GUIDE  Result Date: 02/21/2023 INDICATION: Recurrent symptomatic right malignant pleural effusion. EXAM: ULTRASOUND GUIDED RIGHT THORACENTESIS MEDICATIONS: Local 1% lidocaine only. COMPLICATIONS: None immediate. PROCEDURE: An ultrasound guided thoracentesis was thoroughly discussed with the patient and questions answered. The benefits, risks, alternatives and complications were also discussed. The patient understands and wishes to proceed with the procedure. Written consent was obtained. Ultrasound was performed to localize and mark an adequate pocket of fluid in the right chest. The area was then prepped and draped in the normal sterile fashion. 1% Lidocaine was used for local anesthesia. Under ultrasound guidance a 19 gauge, 7-cm, Yueh catheter was introduced. Thoracentesis was performed. The catheter was removed and a dressing applied. FINDINGS: A total of approximately 1.8 L of serosanguineous fluid was removed. IMPRESSION: Successful ultrasound guided right thoracentesis yielding 1.8 L of pleural fluid. This exam was performed by Pattricia Boss PA-C, and was supervised and interpreted by Dr. Fredia Sorrow. Electronically Signed   By: Irish Lack M.D.   On: 02/21/2023 15:40   DG Chest Port 1 View  Result Date: 02/21/2023 CLINICAL DATA:  Status post right thoracentesis. EXAM: PORTABLE CHEST 1 VIEW COMPARISON:  February 01, 2023. FINDINGS: No pneumothorax is noted status post  right thoracentesis. Small right pleural effusion remains. IMPRESSION: No pneumothorax status post right thoracentesis. Electronically Signed   By: Lupita Raider M.D.   On: 02/21/2023 15:04

## 2023-05-07 NOTE — Assessment & Plan Note (Addendum)
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 No actionable, TMB 18.9 m/mb, MSS, PD-L1 <1%  On palliative chemotherapy with carboplatin/Taxol/bevacizumab.   No actionable mutation on NGS. PDL-1<1%, TMB 18.9   Labs are reviewed and discussed with patient. Overall he tolerates chemotherapy. Keytruda added on 03/26/23  Proceed with carboplatin/Taxol/bevacizumab/Keytruda  Will reschedule CT scan prior to next chemo

## 2023-05-07 NOTE — Assessment & Plan Note (Signed)
Continue potassium chloride 10meq daily.  

## 2023-05-07 NOTE — Assessment & Plan Note (Signed)
Treatment plan as listed above. 

## 2023-05-08 ENCOUNTER — Ambulatory Visit
Admission: RE | Admit: 2023-05-08 | Discharge: 2023-05-08 | Disposition: A | Discharge status: Home / Self Care | Payer: Medicare Other | Source: Ambulatory Visit | Attending: Oncology | Admitting: Oncology

## 2023-05-08 DIAGNOSIS — C3491 Malignant neoplasm of unspecified part of right bronchus or lung: Secondary | ICD-10-CM | POA: Insufficient documentation

## 2023-05-08 LAB — T4: T4, Total: 7.8 ug/dL (ref 4.5–12.0)

## 2023-05-08 MED ORDER — HEPARIN SOD (PORK) LOCK FLUSH 100 UNIT/ML IV SOLN
500.0000 [IU] | Freq: Once | INTRAVENOUS | Status: AC
  Administered 2023-05-08: 500 [IU] via INTRAVENOUS
  Filled 2023-05-08: qty 5

## 2023-05-08 MED ORDER — IOHEXOL 300 MG/ML  SOLN
80.0000 mL | Freq: Once | INTRAMUSCULAR | Status: AC | PRN
  Administered 2023-05-08: 80 mL via INTRAVENOUS

## 2023-05-08 MED ORDER — HEPARIN SOD (PORK) LOCK FLUSH 100 UNIT/ML IV SOLN
INTRAVENOUS | Status: AC
Start: 2023-05-08 — End: ?
  Filled 2023-05-08: qty 5

## 2023-05-15 ENCOUNTER — Ambulatory Visit: Payer: Medicare Other | Admitting: Urology

## 2023-05-18 ENCOUNTER — Encounter: Payer: Self-pay | Admitting: Oncology

## 2023-05-20 ENCOUNTER — Other Ambulatory Visit: Payer: Self-pay

## 2023-05-20 ENCOUNTER — Other Ambulatory Visit: Payer: Medicare Other

## 2023-05-20 DIAGNOSIS — Z9181 History of falling: Secondary | ICD-10-CM

## 2023-05-21 ENCOUNTER — Other Ambulatory Visit: Payer: Medicare Other

## 2023-05-21 ENCOUNTER — Ambulatory Visit
Admission: RE | Admit: 2023-05-21 | Discharge: 2023-05-21 | Disposition: A | Discharge status: Home / Self Care | Payer: Medicare Other | Source: Ambulatory Visit | Attending: Oncology | Admitting: Oncology

## 2023-05-21 DIAGNOSIS — R42 Dizziness and giddiness: Secondary | ICD-10-CM | POA: Diagnosis not present

## 2023-05-21 DIAGNOSIS — Z79899 Other long term (current) drug therapy: Secondary | ICD-10-CM | POA: Diagnosis not present

## 2023-05-21 DIAGNOSIS — Z9181 History of falling: Secondary | ICD-10-CM | POA: Insufficient documentation

## 2023-05-21 DIAGNOSIS — Z043 Encounter for examination and observation following other accident: Secondary | ICD-10-CM | POA: Diagnosis not present

## 2023-05-21 DIAGNOSIS — Z85118 Personal history of other malignant neoplasm of bronchus and lung: Secondary | ICD-10-CM | POA: Diagnosis not present

## 2023-05-24 ENCOUNTER — Encounter: Payer: Self-pay | Admitting: Oncology

## 2023-05-25 ENCOUNTER — Other Ambulatory Visit: Payer: Self-pay

## 2023-05-25 ENCOUNTER — Inpatient Hospital Stay
Admission: EM | Admit: 2023-05-25 | Discharge: 2023-05-26 | DRG: 309 | Disposition: A | Discharge status: Home / Self Care | Payer: Medicare Other | Attending: Internal Medicine | Admitting: Internal Medicine

## 2023-05-25 ENCOUNTER — Emergency Department: Payer: Medicare Other

## 2023-05-25 DIAGNOSIS — M879 Osteonecrosis, unspecified: Secondary | ICD-10-CM | POA: Diagnosis present

## 2023-05-25 DIAGNOSIS — R55 Syncope and collapse: Secondary | ICD-10-CM | POA: Diagnosis not present

## 2023-05-25 DIAGNOSIS — R000 Tachycardia, unspecified: Secondary | ICD-10-CM | POA: Diagnosis present

## 2023-05-25 DIAGNOSIS — I480 Paroxysmal atrial fibrillation: Secondary | ICD-10-CM | POA: Diagnosis not present

## 2023-05-25 DIAGNOSIS — I10 Essential (primary) hypertension: Secondary | ICD-10-CM | POA: Diagnosis present

## 2023-05-25 DIAGNOSIS — Z9221 Personal history of antineoplastic chemotherapy: Secondary | ICD-10-CM

## 2023-05-25 DIAGNOSIS — N529 Male erectile dysfunction, unspecified: Secondary | ICD-10-CM | POA: Diagnosis present

## 2023-05-25 DIAGNOSIS — Z8249 Family history of ischemic heart disease and other diseases of the circulatory system: Secondary | ICD-10-CM

## 2023-05-25 DIAGNOSIS — R109 Unspecified abdominal pain: Secondary | ICD-10-CM | POA: Diagnosis present

## 2023-05-25 DIAGNOSIS — Z8619 Personal history of other infectious and parasitic diseases: Secondary | ICD-10-CM

## 2023-05-25 DIAGNOSIS — Z7901 Long term (current) use of anticoagulants: Secondary | ICD-10-CM

## 2023-05-25 DIAGNOSIS — Z801 Family history of malignant neoplasm of trachea, bronchus and lung: Secondary | ICD-10-CM

## 2023-05-25 DIAGNOSIS — Z79899 Other long term (current) drug therapy: Secondary | ICD-10-CM

## 2023-05-25 DIAGNOSIS — K219 Gastro-esophageal reflux disease without esophagitis: Secondary | ICD-10-CM | POA: Diagnosis present

## 2023-05-25 DIAGNOSIS — Z880 Allergy status to penicillin: Secondary | ICD-10-CM

## 2023-05-25 DIAGNOSIS — Z833 Family history of diabetes mellitus: Secondary | ICD-10-CM

## 2023-05-25 DIAGNOSIS — I2510 Atherosclerotic heart disease of native coronary artery without angina pectoris: Secondary | ICD-10-CM | POA: Diagnosis present

## 2023-05-25 DIAGNOSIS — Z923 Personal history of irradiation: Secondary | ICD-10-CM

## 2023-05-25 DIAGNOSIS — C3411 Malignant neoplasm of upper lobe, right bronchus or lung: Secondary | ICD-10-CM | POA: Diagnosis present

## 2023-05-25 DIAGNOSIS — E785 Hyperlipidemia, unspecified: Secondary | ICD-10-CM | POA: Diagnosis present

## 2023-05-25 DIAGNOSIS — Z7951 Long term (current) use of inhaled steroids: Secondary | ICD-10-CM

## 2023-05-25 DIAGNOSIS — C3491 Malignant neoplasm of unspecified part of right bronchus or lung: Secondary | ICD-10-CM | POA: Diagnosis present

## 2023-05-25 DIAGNOSIS — Z87891 Personal history of nicotine dependence: Secondary | ICD-10-CM

## 2023-05-25 DIAGNOSIS — E876 Hypokalemia: Secondary | ICD-10-CM | POA: Diagnosis present

## 2023-05-25 DIAGNOSIS — R531 Weakness: Secondary | ICD-10-CM | POA: Diagnosis present

## 2023-05-25 DIAGNOSIS — I4891 Unspecified atrial fibrillation: Secondary | ICD-10-CM

## 2023-05-25 DIAGNOSIS — Z8711 Personal history of peptic ulcer disease: Secondary | ICD-10-CM

## 2023-05-25 DIAGNOSIS — E119 Type 2 diabetes mellitus without complications: Secondary | ICD-10-CM | POA: Diagnosis present

## 2023-05-25 DIAGNOSIS — I4892 Unspecified atrial flutter: Principal | ICD-10-CM | POA: Diagnosis present

## 2023-05-25 DIAGNOSIS — N400 Enlarged prostate without lower urinary tract symptoms: Secondary | ICD-10-CM | POA: Diagnosis present

## 2023-05-25 DIAGNOSIS — J4489 Other specified chronic obstructive pulmonary disease: Secondary | ICD-10-CM | POA: Diagnosis present

## 2023-05-25 DIAGNOSIS — Z7982 Long term (current) use of aspirin: Secondary | ICD-10-CM

## 2023-05-25 DIAGNOSIS — Z79818 Long term (current) use of other agents affecting estrogen receptors and estrogen levels: Secondary | ICD-10-CM

## 2023-05-25 DIAGNOSIS — F419 Anxiety disorder, unspecified: Secondary | ICD-10-CM | POA: Diagnosis present

## 2023-05-25 LAB — BASIC METABOLIC PANEL
Anion gap: 10 (ref 5–15)
BUN: 16 mg/dL (ref 8–23)
CO2: 28 mmol/L (ref 22–32)
Calcium: 8.6 mg/dL — ABNORMAL LOW (ref 8.9–10.3)
Chloride: 99 mmol/L (ref 98–111)
Creatinine, Ser: 0.78 mg/dL (ref 0.61–1.24)
GFR, Estimated: >60 mL/min (ref >60)
Glucose, Bld: 143 mg/dL — ABNORMAL HIGH (ref 70–99)
Potassium: 3.4 mmol/L — ABNORMAL LOW (ref 3.5–5.1)
Sodium: 137 mmol/L (ref 135–145)

## 2023-05-25 LAB — CBC
HCT: 40 % (ref 39.0–52.0)
Hemoglobin: 12.8 g/dL — ABNORMAL LOW (ref 13.0–17.0)
MCH: 29.4 pg (ref 26.0–34.0)
MCHC: 32 g/dL (ref 30.0–36.0)
MCV: 92 fL (ref 80.0–100.0)
Platelets: 306 10*3/uL (ref 150–400)
RBC: 4.35 MIL/uL (ref 4.22–5.81)
RDW: 16.1 % — ABNORMAL HIGH (ref 11.5–15.5)
WBC: 10.3 10*3/uL (ref 4.0–10.5)
nRBC: 0 % (ref 0.0–0.2)

## 2023-05-25 LAB — TROPONIN I (HIGH SENSITIVITY)
Troponin I (High Sensitivity): 32 ng/L — ABNORMAL HIGH (ref <18)
Troponin I (High Sensitivity): 40 ng/L — ABNORMAL HIGH (ref <18)

## 2023-05-25 LAB — MAGNESIUM: Magnesium: 1.6 mg/dL — ABNORMAL LOW (ref 1.7–2.4)

## 2023-05-25 LAB — TSH: TSH: 2.58 u[IU]/mL (ref 0.350–4.500)

## 2023-05-25 LAB — PHOSPHORUS: Phosphorus: 3.4 mg/dL (ref 2.5–4.6)

## 2023-05-25 MED ORDER — SENNOSIDES-DOCUSATE SODIUM 8.6-50 MG PO TABS
2.0000 | ORAL_TABLET | Freq: Every day | ORAL | Status: DC
  Administered 2023-05-25 – 2023-05-26 (×2): 2 via ORAL
  Filled 2023-05-25 (×2): qty 2

## 2023-05-25 MED ORDER — LISINOPRIL 10 MG PO TABS
40.0000 mg | ORAL_TABLET | Freq: Every day | ORAL | Status: DC
  Administered 2023-05-26: 40 mg via ORAL
  Filled 2023-05-25: qty 4

## 2023-05-25 MED ORDER — AMIODARONE HCL IN DEXTROSE 360-4.14 MG/200ML-% IV SOLN
60.0000 mg/h | INTRAVENOUS | Status: AC
  Administered 2023-05-25: 60 mg/h via INTRAVENOUS
  Filled 2023-05-25 (×2): qty 200

## 2023-05-25 MED ORDER — ETOMIDATE 2 MG/ML IV SOLN
INTRAVENOUS | Status: AC | PRN
  Administered 2023-05-25: 10 mg via INTRAVENOUS

## 2023-05-25 MED ORDER — TAMSULOSIN HCL 0.4 MG PO CAPS
0.4000 mg | ORAL_CAPSULE | Freq: Every day | ORAL | Status: DC
  Administered 2023-05-26: 0.4 mg via ORAL
  Filled 2023-05-25: qty 1

## 2023-05-25 MED ORDER — IPRATROPIUM-ALBUTEROL 0.5-2.5 (3) MG/3ML IN SOLN: 3.0000 mL | Freq: Four times a day (QID) | RESPIRATORY_TRACT | Status: DC | PRN

## 2023-05-25 MED ORDER — MEGESTROL ACETATE 20 MG PO TABS
80.0000 mg | ORAL_TABLET | Freq: Two times a day (BID) | ORAL | Status: DC
  Administered 2023-05-25 – 2023-05-26 (×2): 80 mg via ORAL
  Filled 2023-05-25 (×2): qty 4

## 2023-05-25 MED ORDER — AMLODIPINE BESYLATE 5 MG PO TABS
10.0000 mg | ORAL_TABLET | Freq: Every day | ORAL | Status: DC
  Administered 2023-05-26: 10 mg via ORAL
  Filled 2023-05-25: qty 2

## 2023-05-25 MED ORDER — AMIODARONE LOAD VIA INFUSION
150.0000 mg | Freq: Once | INTRAVENOUS | Status: AC
  Administered 2023-05-25: 150 mg via INTRAVENOUS
  Filled 2023-05-25: qty 83.34

## 2023-05-25 MED ORDER — TORSEMIDE 20 MG PO TABS: 20.0000 mg | ORAL_TABLET | ORAL | Status: DC

## 2023-05-25 MED ORDER — AMIODARONE HCL IN DEXTROSE 360-4.14 MG/200ML-% IV SOLN
30.0000 mg/h | INTRAVENOUS | Status: DC
  Administered 2023-05-25: 30 mg/h via INTRAVENOUS

## 2023-05-25 MED ORDER — FLUTICASONE FUROATE-VILANTEROL 200-25 MCG/ACT IN AEPB: 1.0000 | INHALATION_SPRAY | Freq: Every day | RESPIRATORY_TRACT | Status: DC

## 2023-05-25 MED ORDER — SPIRONOLACTONE 25 MG PO TABS
25.0000 mg | ORAL_TABLET | Freq: Every day | ORAL | Status: DC
  Administered 2023-05-26: 25 mg via ORAL
  Filled 2023-05-25: qty 1

## 2023-05-25 MED ORDER — APIXABAN 5 MG PO TABS
5.0000 mg | ORAL_TABLET | Freq: Two times a day (BID) | ORAL | Status: DC
  Administered 2023-05-25 – 2023-05-26 (×2): 5 mg via ORAL
  Filled 2023-05-25 (×2): qty 1

## 2023-05-25 MED ORDER — SODIUM CHLORIDE 0.9 % IV BOLUS
1000.0000 mL | Freq: Once | INTRAVENOUS | Status: AC
  Administered 2023-05-25: 1000 mL via INTRAVENOUS

## 2023-05-25 MED ORDER — LORAZEPAM 0.5 MG PO TABS
0.5000 mg | ORAL_TABLET | Freq: Two times a day (BID) | ORAL | Status: DC | PRN
  Administered 2023-05-25: 0.5 mg via ORAL
  Filled 2023-05-25: qty 1

## 2023-05-25 MED ORDER — METOPROLOL TARTRATE 50 MG PO TABS
50.0000 mg | ORAL_TABLET | Freq: Two times a day (BID) | ORAL | Status: DC
  Administered 2023-05-25 – 2023-05-26 (×2): 50 mg via ORAL
  Filled 2023-05-25 (×2): qty 1

## 2023-05-25 MED ORDER — ACETAMINOPHEN 500 MG PO TABS: 500.0000 mg | ORAL_TABLET | Freq: Four times a day (QID) | ORAL | Status: DC | PRN

## 2023-05-25 MED ORDER — PRAVASTATIN SODIUM 20 MG PO TABS
20.0000 mg | ORAL_TABLET | Freq: Every day | ORAL | Status: DC
  Administered 2023-05-25: 20 mg via ORAL
  Filled 2023-05-25: qty 1

## 2023-05-25 MED ORDER — METOPROLOL TARTRATE 5 MG/5ML IV SOLN
5.0000 mg | Freq: Once | INTRAVENOUS | Status: AC
Start: 2023-05-25 — End: 2023-05-25
  Administered 2023-05-25: 5 mg via INTRAVENOUS
  Filled 2023-05-25: qty 5

## 2023-05-25 MED ORDER — ETOMIDATE 2 MG/ML IV SOLN
0.1500 mg/kg | Freq: Once | INTRAVENOUS | Status: DC
  Filled 2023-05-25: qty 10

## 2023-05-25 MED ORDER — ASPIRIN 81 MG PO TBEC
81.0000 mg | DELAYED_RELEASE_TABLET | Freq: Every day | ORAL | Status: DC
Start: 2023-05-26 — End: 2023-05-26
  Administered 2023-05-26: 81 mg via ORAL
  Filled 2023-05-25: qty 1

## 2023-05-25 MED ORDER — LORATADINE 10 MG PO TABS
10.0000 mg | ORAL_TABLET | Freq: Every day | ORAL | Status: DC
  Filled 2023-05-25: qty 1

## 2023-05-25 MED ORDER — AMIODARONE HCL IN DEXTROSE 360-4.14 MG/200ML-% IV SOLN
30.0000 mg/h | INTRAVENOUS | Status: DC
Start: 2023-05-25 — End: 2023-05-26
  Administered 2023-05-25: 30 mg/h via INTRAVENOUS
  Filled 2023-05-25: qty 200

## 2023-05-25 MED ORDER — GABAPENTIN 300 MG PO CAPS: 300.0000 mg | ORAL_CAPSULE | Freq: Every evening | ORAL | Status: DC | PRN

## 2023-05-25 MED ORDER — AMIODARONE IV BOLUS ONLY 150 MG/100ML
INTRAVENOUS | Status: AC
  Filled 2023-05-25: qty 100

## 2023-05-25 MED ORDER — MIRABEGRON ER 50 MG PO TB24
50.0000 mg | ORAL_TABLET | Freq: Every day | ORAL | Status: DC
  Administered 2023-05-26: 50 mg via ORAL
  Filled 2023-05-25: qty 1

## 2023-05-25 MED ORDER — FLUTICASONE PROPIONATE 50 MCG/ACT NA SUSP: 1.0000 | Freq: Every day | NASAL | Status: DC | PRN

## 2023-05-25 MED ORDER — GUAIFENESIN-CODEINE 100-10 MG/5ML PO SOLN: 5.0000 mL | Freq: Three times a day (TID) | ORAL | Status: DC | PRN

## 2023-05-25 MED ORDER — PROCHLORPERAZINE MALEATE 10 MG PO TABS: 10.0000 mg | ORAL_TABLET | Freq: Four times a day (QID) | ORAL | Status: DC | PRN

## 2023-05-25 MED ORDER — UMECLIDINIUM BROMIDE 62.5 MCG/ACT IN AEPB: 1.0000 | INHALATION_SPRAY | Freq: Every day | RESPIRATORY_TRACT | Status: DC

## 2023-05-25 MED ORDER — POTASSIUM CHLORIDE CRYS ER 20 MEQ PO TBCR
40.0000 meq | EXTENDED_RELEASE_TABLET | Freq: Once | ORAL | Status: AC
  Administered 2023-05-25: 40 meq via ORAL
  Filled 2023-05-25: qty 2

## 2023-05-25 MED ORDER — SODIUM CHLORIDE 0.9 % IV BOLUS
1000.0000 mL | Freq: Once | INTRAVENOUS | Status: AC
Start: 2023-05-25 — End: 2023-05-25
  Administered 2023-05-25: 1000 mL via INTRAVENOUS

## 2023-05-25 MED ORDER — IBUPROFEN 600 MG PO TABS: 600.0000 mg | ORAL_TABLET | Freq: Four times a day (QID) | ORAL | Status: DC | PRN

## 2023-05-25 MED ORDER — ONDANSETRON HCL 4 MG/2ML IJ SOLN: 4.0000 mg | Freq: Four times a day (QID) | INTRAMUSCULAR | Status: DC | PRN

## 2023-05-25 NOTE — ED Provider Notes (Signed)
[facility] Provider Note    Event Date/Time   First MD Initiated Contact with Patient 05/25/23 1328     (approximate)   History   Weakness   HPI  Mark Mccarty is a 72 y.o. male who presents to the ER for evaluation of weakness and near syncope that occurred today.  Patient is on chemotherapy for stage IV lung cancer.  He is on Eliquis.  Did not have much of an appetite today.  Felt like he was about to pass out and called EMS.  Denies any worsening shortness of breath no chest pain currently no cough or hemoptysis.     Physical Exam   Triage Vital Signs: ED Triage Vitals  Encounter Vitals Group     BP 05/25/23 1313 (!) 71/57     Systolic BP Percentile --      Diastolic BP Percentile --      Pulse Rate 05/25/23 1313 (!) 149     Resp 05/25/23 1313 20     Temp 05/25/23 1313 97.9 F (36.6 C)     Temp Source 05/25/23 1313 Axillary     SpO2 05/25/23 1313 93 %     Weight 05/25/23 1310 180 lb (81.6 kg)     Height 05/25/23 1310 6' (1.829 m)     Head Circumference --      Peak Flow --      Pain Score 05/25/23 1310 0     Pain Loc --      Pain Education --      Exclude from Growth Chart --     Most recent vital signs: Vitals:   05/25/23 1554 05/25/23 1605  BP: (!) 116/96 (!) 153/103  Pulse: (!) 132 94  Resp: 18 18  Temp: 98 F (36.7 C)   SpO2: 96% 96%     Constitutional: Alert  Eyes: Conjunctivae are normal.  Head: Atraumatic. Nose: No congestion/rhinnorhea. Mouth/Throat: Mucous membranes are moist.   Neck: Painless ROM.  Cardiovascular:   Good peripheral circulation. Respiratory: Normal respiratory effort.  No retractions.  Gastrointestinal: Soft and nontender.  Musculoskeletal:  no deformity Neurologic:  MAE spontaneously. No gross focal neurologic deficits are appreciated.  Skin:  Skin is warm, dry and intact. No rash noted. Psychiatric: Mood and affect are normal. Speech and behavior are normal.    ED Results / Procedures  / Treatments   Labs (all labs ordered are listed, but only abnormal results are displayed) Labs Reviewed  CBC - Abnormal; Notable for the following components:      Result Value   Hemoglobin 12.8 (*)    RDW 16.1 (*)    All other components within normal limits  BASIC METABOLIC PANEL - Abnormal; Notable for the following components:   Potassium 3.4 (*)    Glucose, Bld 143 (*)    Calcium 8.6 (*)    All other components within normal limits  TROPONIN I (HIGH SENSITIVITY) - Abnormal; Notable for the following components:   Troponin I (High Sensitivity) 32 (*)    All other components within normal limits  URINALYSIS, ROUTINE W REFLEX MICROSCOPIC  MAGNESIUM  PHOSPHORUS     EKG  ED ECG REPORT I, Willy Eddy, the attending physician, personally viewed and interpreted this ECG.   Date: 05/25/2023  EKG Time: 13:24  Rate: 150  Rhythm: aflutter  Axis: normal  Intervals: normal  ST&T Change:  non specific st abn    RADIOLOGY Please see ED Course for my review and  interpretation.  I personally reviewed all radiographic images ordered to evaluate for the above acute complaints and reviewed radiology reports and findings.  These findings were personally discussed with the patient.  Please see medical record for radiology report.    PROCEDURES:  Critical Care performed: No  .Critical Care  Performed by: Willy Eddy, MD Authorized by: Willy Eddy, MD   Critical care provider statement:    Critical care time (minutes):  40   Critical care was necessary to treat or prevent imminent or life-threatening deterioration of the following conditions:  Cardiac failure   Critical care was time spent personally by me on the following activities:  Ordering and performing treatments and interventions, ordering and review of laboratory studies, ordering and review of radiographic studies, pulse oximetry, re-evaluation of patient's condition, review of old charts, obtaining  history from patient or surrogate, examination of patient, evaluation of patient's response to treatment, discussions with primary provider, discussions with consultants and development of treatment plan with patient or surrogate .Cardioversion  Date/Time: 05/25/2023 4:06 PM  Performed by: Willy Eddy, MD Authorized by: Willy Eddy, MD   Consent:    Consent obtained:  Written Pre-procedure details:    Rhythm:  Atrial fibrillation   Electrode placement:  Anterior-posterior Patient sedated: Yes. Refer to sedation procedure documentation for details of sedation.  Attempt one:    Cardioversion mode:  Synchronous   Waveform:  Biphasic   Shock (Joules):  100   Shock outcome:  Conversion to normal sinus rhythm Post-procedure details:    Patient status:  Awake   Patient tolerance of procedure:  Tolerated well, no immediate complications .Sedation  Date/Time: 05/25/2023 4:07 PM  Performed by: Willy Eddy, MD Authorized by: Willy Eddy, MD   Consent:    Consent obtained:  Written   Consent given by:  Patient   Alternatives discussed:  Analgesia without sedation Universal protocol:    Immediately prior to procedure, a time out was called: yes   Indications:    Procedure performed:  Cardioversion Pre-sedation assessment:    Time since last food or drink:  6   ASA classification: class 2 - patient with mild systemic disease     Mouth opening:  3 or more finger widths   Thyromental distance:  4 finger widths   Mallampati score:  II - soft palate, uvula, fauces visible   Neck mobility: normal     Pre-sedation assessments completed and reviewed: airway patency, cardiovascular function, hydration status, mental status, nausea/vomiting, respiratory function and temperature   Immediate pre-procedure details:    Reassessment: Patient reassessed immediately prior to procedure   Procedure details (see MAR for exact dosages):    Sedation:  Etomidate   Intended level  of sedation: deep   Total Provider sedation time (minutes):  10 Post-procedure details:    Procedure completion:  Tolerated well, no immediate complications    MEDICATIONS ORDERED IN ED: Medications  amiodarone (NEXTERONE) 1.8 mg/mL load via infusion 150 mg (150 mg Intravenous Bolus from Bag 05/25/23 1522)    Followed by  amiodarone (NEXTERONE PREMIX) 360-4.14 MG/200ML-% (1.8 mg/mL) IV infusion (60 mg/hr Intravenous New Bag/Given 05/25/23 1521)    Followed by  amiodarone (NEXTERONE PREMIX) 360-4.14 MG/200ML-% (1.8 mg/mL) IV infusion (has no administration in time range)  etomidate (AMIDATE) injection 12.24 mg (has no administration in time range)  potassium chloride SA (KLOR-CON M) CR tablet 40 mEq (has no administration in time range)  etomidate (AMIDATE) injection (10 mg Intravenous Given 05/25/23 1601)  acetaminophen (TYLENOL)  tablet 500 mg (has no administration in time range)  aspirin tablet 81 mg (has no administration in time range)  ibuprofen (ADVIL) tablet 600 mg (has no administration in time range)  megestrol (MEGACE) tablet 80 mg (has no administration in time range)  metoprolol tartrate (LOPRESSOR) tablet 50 mg (has no administration in time range)  pravastatin (PRAVACHOL) tablet 20 mg (has no administration in time range)  spironolactone (ALDACTONE) tablet 25 mg (has no administration in time range)  torsemide (DEMADEX) tablet 20 mg (has no administration in time range)  LORazepam (ATIVAN) tablet 0.5 mg (has no administration in time range)  prochlorperazine (COMPAZINE) tablet 10 mg (has no administration in time range)  senna-docusate (Senokot-S) tablet 2 tablet (has no administration in time range)  mirabegron ER (MYRBETRIQ) tablet 50 mg (has no administration in time range)  tamsulosin (FLOMAX) capsule 0.4 mg (has no administration in time range)  apixaban (ELIQUIS) tablet 5 mg (has no administration in time range)  gabapentin (NEURONTIN) capsule 300 mg (has no  administration in time range)  loratadine (CLARITIN) tablet 10 mg (has no administration in time range)  fluticasone (FLONASE) 50 MCG/ACT nasal spray 1 spray (has no administration in time range)  fluticasone furoate-vilanterol (BREO ELLIPTA) 200-25 MCG/ACT 1 puff (has no administration in time range)    And  umeclidinium bromide (INCRUSE ELLIPTA) 62.5 MCG/ACT 1 puff (has no administration in time range)  guaiFENesin-codeine (ROBITUSSIN AC) 100-10 MG/5ML syrup 5 mL (has no administration in time range)  ipratropium-albuterol (DUONEB) 0.5-2.5 (3) MG/3ML nebulizer solution 3 mL (has no administration in time range)  ondansetron (ZOFRAN) injection 4 mg (has no administration in time range)  sodium chloride 0.9 % bolus 1,000 mL (1,000 mLs Intravenous New Bag/Given 05/25/23 1415)  metoprolol tartrate (LOPRESSOR) injection 5 mg (5 mg Intravenous Given 05/25/23 1449)  sodium chloride 0.9 % bolus 1,000 mL (1,000 mLs Intravenous New Bag/Given 05/25/23 1504)     IMPRESSION / MDM / ASSESSMENT AND PLAN / ED COURSE  I reviewed the triage vital signs and the nursing notes.                              Differential diagnosis includes, but is not limited to, Asthma, copd, CHF, pna, ptx, malignancy, Pe, anemia  Patient presenting to the ER for evaluation of symptoms as described above.  Based on symptoms, risk factors and considered above differential, this presenting complaint could reflect a potentially life-threatening illness therefore the patient will be placed on continuous pulse oximetry and telemetry for monitoring.  Laboratory evaluation will be sent to evaluate for the above complaints.     Clinical Course as of 05/25/23 1607  Sat May 25, 2023  1436 Chest x-ray on my review and interpretation consistent with known metastatic lung disease. [PR]  1443 Patient's heart rate seems to be fixed in the 140s.  Looks like underlying a flutter.  Remains with good mentation well-perfused.  Is on metoprolol  we will see if this responds. [PR]  1502 Discussed patient's persistent a flutter with Dr. Llana Aliment of cardiology.  Plan will be to trial amiodarone bolus followed by infusion.  If he is able to convert with the bolus will admit for amnio loading.  If he does not convert given his poor lung reserve may be in best interest to proceed with cardioversion. [PR]  1545 Patient with very brief episode of slower a flutter but back in flutter with rate of 130s.  Will plan cardioversion.  Patient reiterates that he has been compliant with his Eliquis.  We discussed consent of sedation with etomidate and electrical cardioversion and patient is agreeable to proceeding.  Case was discussed in consultation with hospitalist for admission. [PR]  1606 Patient tolerated cardioversion without complication has converted to sinus rhythm. [PR]    Clinical Course User Index [PR] Willy Eddy, MD     FINAL CLINICAL IMPRESSION(S) / ED DIAGNOSES   Final diagnoses:  Atrial flutter with rapid ventricular response (HCC)     Rx / DC Orders   ED Discharge Orders     None        Note:  This document was prepared using Dragon voice recognition software and may include unintentional dictation errors.    Willy Eddy, MD 05/25/23 [phone]

## 2023-05-25 NOTE — Sedation Documentation (Signed)
Synchronized cardioversion performed. 1 Shocked at 100 J delivered. Pt converted to NSR

## 2023-05-25 NOTE — H&P (Signed)
History and Physical    Mark Mccarty HQI:[id number] DOB: 1950/10/11 DOA: 05/25/2023  PCP: [name], NP (Confirm with patient/family/NH records and if not entered, this has to be entered at [facility] point of entry) Patient coming from: Home  I have personally briefly reviewed patient's old medical records in [facility]  Chief Complaint: Feeling dizzy.  HPI: Mark Mccarty is a 72 y.o. 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  Symptoms started about 2 to 3 weeks ago, when patient developed significant dizziness especially when changing body position from lying down or sitting down to standing up lasted for few seconds.  Denies any chest pain palpitations shortness of breath.  Since yesterday he has been having more frequent episode and has had several near falls.  He has history of PAF and underwent cardioversion x 2 in the past.  ED Course: Heart rate in the 150-180s, EKG and telemonitoring showed a flutter.  Blood pressure 71/57.  Checks x-ray showed right upper lobe mass and persistent right-sided pleural effusion.  Blood work showed K3.4, creatinine 0.7.  Cardioversion performed in the ED and patient restored to normal sinus rhythm.  Review of Systems: As per HPI otherwise 14 point review of systems negative.    Past Medical History:  Diagnosis Date   Abnormal nuclear stress test    Anxiety    a.) on BZO (lorazepam) PRN   Asthma    Avascular necrosis of femoral head (HCC)    BPH (benign prostatic hyperplasia)    Cataract    a.) s/p extraction on LEFT   Colon adenomas    COPD (chronic obstructive pulmonary disease) (HCC)    Coronary artery disease 11/11/2014   a.) MPI 11/11/2014: EF 56%, mild inf wall ischemia; b.) LHC 12/16/2014: 20% mRCA - med mgmt   DDD (degenerative disc  disease), lumbar    Diverticulosis    DM type 2 (diabetes mellitus, type 2) (HCC)    Duodenitis    Dysphagia    Dyspnea    ED (erectile dysfunction)    a.) on PDE5i (sildenafil) PRN   Elevated PSA    Former smoker    Gastritis    GERD (gastroesophageal reflux disease)    [name]    Hilar mass    History of hiatal hernia    Hyperlipidemia    Hypertension    Hyperuricemia    Hypokalemia    Left ventricular diastolic dysfunction 10/04/2016   a.) TTE 10/04/2016: EF 50%, mild MR/TR/PR, mod AR, G1DD; b.) TTE 10/10/2017: EF 45%, triv PR, mild AR/MR/TR, G1DD; c.) TTE 03/30/2020: EF 50%, mild-mod AR, mild MR/TR/PR; d.) TTE 04/23/2022: EF >55%, mild LVH, mild LAE, triv TR, mild PR, mod AR/MR   Liver lesion    Obesity    Pedal edema    Peripheral polyneuropathy    Peyronie's disease 05/2022   Pleural effusion    Primary lung adenocarcinoma, right (HCC)    a.) stage IIIA (cT3, cN1, cM0) --> s/p concurrent chemoradiation 2019 --> carboplatin + pacilitaxel (12/31/2017 - 02/19/2018) followed by 1 year (03/24/2018 - 03/17/2019) adjuvant anti-PDL-1 mAB immunotherapy (durvalumab)   [name] (peptic ulcer disease)    Renal insufficiency    Umbilical hernia    [name]     Past Surgical History:  Procedure Laterality Date  CARDIAC CATHETERIZATION N/A 12/16/2014   Procedure: Left Heart Cath;  Surgeon: Marcina Millard, MD;  Location: [facility] INVASIVE CV LAB;  Service: Cardiovascular;  Laterality: N/A;   CARDIOVERSION N/A 01/17/2023   Procedure: CARDIOVERSION;  Surgeon: Clotilde Dieter, DO;  Location: [name];  Service: Cardiovascular;  Laterality: N/A;   CATARACT EXTRACTION Left    COLONOSCOPY     COLONOSCOPY WITH PROPOFOL N/A 01/14/2020   Procedure: COLONOSCOPY WITH PROPOFOL;  Surgeon: Toledo, Boykin Nearing, MD;  Location: [facility] ENDOSCOPY;  Service: Gastroenterology;  Laterality: N/A;   CYST REMOVAL TRUNK     chest and back over time and it was removed    CYSTECTOMY     ENDOBRONCHIAL ULTRASOUND N/A 12/09/2017   Procedure: ENDOBRONCHIAL ULTRASOUND;  Surgeon: Shane Crutch, MD;  Location: [name];  Service: Pulmonary;  Laterality: N/A;   IR GUIDED DRAIN W CATHETER PLACEMENT  03/21/2023   PORTA CATH INSERTION N/A 12/23/2017   Procedure: PORTA CATH INSERTION;  Surgeon: Annice Needy, MD;  Location: [facility] INVASIVE CV LAB;  Service: Cardiovascular;  Laterality: N/A;   PORTA CATH INSERTION N/A 01/08/2023   Procedure: PORTA CATH INSERTION;  Surgeon: Annice Needy, MD;  Location: [facility] INVASIVE CV LAB;  Service: Cardiovascular;  Laterality: N/A;   PORTA CATH REMOVAL N/A 06/04/2022   Procedure: PORTA CATH REMOVAL;  Surgeon: Annice Needy, MD;  Location: [facility] INVASIVE CV LAB;  Service: Cardiovascular;  Laterality: N/A;   TEE WITHOUT CARDIOVERSION N/A 01/17/2023   Procedure: TRANSESOPHAGEAL ECHOCARDIOGRAM (TEE);  Surgeon: Clotilde Dieter, DO;  Location: [name];  Service: Cardiovascular;  Laterality: N/A;   UPPER GI ENDOSCOPY     VIDEO BRONCHOSCOPY WITH ENDOBRONCHIAL ULTRASOUND N/A 12/17/2022   Procedure: VIDEO BRONCHOSCOPY WITH ENDOBRONCHIAL ULTRASOUND;  Surgeon: Vida Rigger, MD;  Location: [name];  Service: Thoracic;  Laterality: N/A;     reports that he quit smoking about 7 years ago. His smoking use included cigarettes. He started smoking about 42 years ago. He has a 52.5 pack-year smoking history. He has been exposed to tobacco smoke. He has never used smokeless tobacco. He reports that he does not currently use alcohol. He reports that he does not use drugs.  Allergies  Allergen Reactions   Penicillins Rash    Stomach hurt Has patient had a PCN reaction causing immediate rash, facial/tongue/throat swelling, SOB or lightheadedness with hypotension: yes Has patient had a PCN reaction causing severe rash involving mucus membranes or skin necrosis: no Has patient had a PCN reaction that required hospitalization: no Has patient had a PCN  reaction occurring within the last 10 years: yes If all of the above answers are "NO", then may proceed with Cephalosporin use.     Family History  Problem Relation Age of Onset   Hypertension Mother    Breast cancer Mother    Heart attack Mother    Lung cancer Father    Heart disease Sister    Diabetes Sister    Colon cancer Sister      Prior to Admission medications   Medication Sig Start Date End Date Taking? Authorizing Provider  acetaminophen (TYLENOL) 500 MG tablet Take 500 mg by mouth as needed.    [provider]  albuterol (VENTOLIN HFA) 108 (90 Base) MCG/ACT inhaler Inhale 2 puffs into the lungs every 4 (four) hours as needed. 11/08/22   [provider]  amLODipine (NORVASC) 10 MG tablet Take 10 mg by mouth every morning. 09/11/17   [provider]  apixaban (ELIQUIS) 5 MG TABS  tablet Take 5 mg by mouth 2 (two) times daily. 01/10/23   [provider]  ascorbic acid (VITAMIN C) 1000 MG tablet Take 1,000 mg by mouth daily.    Mickey Farber, MD  aspirin 81 MG tablet Take 81 mg by mouth daily.    [provider]  cetirizine (ZYRTEC) 10 MG tablet Take 1 tablet by mouth as needed. 10/25/22 10/25/23  [provider]  cholecalciferol (VITAMIN D3) 25 MCG (1000 UNIT) tablet Take 1,000 Units by mouth daily.    [provider]  dexamethasone (DECADRON) 4 MG tablet Take 2 tablets (8mg ) by mouth daily starting the day after carboplatin for 3 days. Take with food 12/24/22   [name], MD  fluticasone [facility]) 50 MCG/ACT nasal spray Place 1 spray into both nostrils daily. 01/21/23   [provider]  Fluticasone-Umeclidin-Vilant (TRELEGY ELLIPTA) 200-62.5-25 MCG/ACT AEPB Inhale 1 puff into the lungs every morning.    [provider]  gabapentin (NEURONTIN) 300 MG capsule Take 300 mg by mouth at bedtime as needed.     [provider]  guaiFENesin-codeine (ROBITUSSIN AC) 100-10 MG/5ML syrup Take 5 mLs by mouth 3  (three) times daily as needed for cough. 05/07/23   [name], MD  ibuprofen (ADVIL,MOTRIN) 600 MG tablet Take 600 mg by mouth every 6 (six) hours as needed.    [provider]  ipratropium-albuterol (DUONEB) 0.5-2.5 (3) MG/3ML SOLN Take 3 mLs by nebulization every 6 (six) hours as needed.    [provider]  lidocaine-prilocaine (EMLA) cream Apply to affected area once 12/24/22   [name], MD  lisinopril (PRINIVIL,ZESTRIL) 40 MG tablet Take 40 mg by mouth every morning.    [provider]  LORazepam (ATIVAN) 0.5 MG tablet Take 1 tablet (0.5 mg total) by mouth every 12 (twelve) hours as needed for anxiety. 11/21/22   [name], MD  megestrol (MEGACE) 40 MG tablet Take 2 tablets (80 mg total) by mouth 2 (two) times daily. 04/16/23   [name], MD  metoprolol tartrate (LOPRESSOR) 50 MG tablet Take 50 mg by mouth 2 (two) times daily.    [provider]  mirabegron ER (MYRBETRIQ) 50 MG TB24 tablet Take 1 tablet (50 mg total) by mouth daily. 02/14/23   Sondra Come, MD  mupirocin ointment (BACTROBAN) 2 % Apply 1 Application topically daily. 01/29/23   Georgiana Spinner, NP  ondansetron (ZOFRAN) 8 MG tablet Take 1 tablet (8 mg total) by mouth every 8 (eight) hours as needed for nausea or vomiting. Start on the third day after carboplatin. 12/24/22   [name], MD  potassium chloride (KLOR-CON) 10 MEQ tablet Take 1 tablet (10 mEq total) by mouth daily. 04/16/23   [name], MD  pravastatin (PRAVACHOL) 20 MG tablet Take 20 mg by mouth at bedtime. 09/15/18   [provider]  prochlorperazine (COMPAZINE) 10 MG tablet Take 1 tablet (10 mg total) by mouth every 6 (six) hours as needed for nausea or vomiting. 02/12/23   [name], MD  senna-docusate (SENOKOT-S) 8.6-50 MG tablet Take 2 tablets by mouth daily. 01/09/23   [name], MD  spironolactone (ALDACTONE) 25 MG tablet Take by mouth. 12/17/22 12/17/23  [provider]  tamsulosin (FLOMAX) 0.4 MG CAPS capsule Take 0.4 mg  by mouth daily after breakfast. 03/14/17   [provider]  torsemide (DEMADEX) 20 MG tablet Take 20 mg by mouth every morning.    [provider]    Physical Exam: Vitals:  05/25/23 1430 05/25/23 1554 05/25/23 1559 05/25/23 1605  BP: 97/70 (!) 116/96  (!) 153/103  Pulse: (!) 146 (!) 132  94  Resp: 13 18  18   Temp:  98 F (36.7 C)    TempSrc:      SpO2: 96% 96%  96%  Weight:   94.3 kg   Height:        Constitutional: NAD, calm, comfortable Vitals:   05/25/23 1430 05/25/23 1554 05/25/23 1559 05/25/23 1605  BP: 97/70 (!) 116/96  (!) 153/103  Pulse: (!) 146 (!) 132  94  Resp: 13 18  18   Temp:  98 F (36.7 C)    TempSrc:      SpO2: 96% 96%  96%  Weight:   94.3 kg   Height:       Eyes: PERRL, lids and conjunctivae normal ENMT: Mucous membranes are moist. Posterior pharynx clear of any exudate or lesions.Normal dentition.  Neck: normal, supple, no masses, no thyromegaly Respiratory: Diminished breathing sound on the right side, no wheezing, no crackles. Normal respiratory effort. No accessory muscle use.  Cardiovascular: Irregular heartbeat, no murmurs / rubs / gallops. No extremity edema. 2+ pedal pulses. No carotid bruits.  Abdomen: no tenderness, no masses palpated. No hepatosplenomegaly. Bowel sounds positive.  Musculoskeletal: no clubbing / cyanosis. No joint deformity upper and lower extremities. Good ROM, no contractures. Normal muscle tone.  Skin: no rashes, lesions, ulcers. No induration Neurologic: CN 2-12 grossly intact. Sensation intact, DTR normal. Strength 5/5 in all 4.  Psychiatric: Normal judgment and insight. Alert and oriented x 3. Normal mood.     Labs on Admission: I have personally reviewed following labs and imaging studies  CBC: Recent Labs  Lab 05/25/23 1332  WBC 10.3  HGB 12.8*  HCT 40.0  MCV 92.0  PLT 306   Basic Metabolic Panel: Recent Labs  Lab 05/25/23 1423  NA 137  K 3.4*  CL 99  CO2 28  GLUCOSE 143*  BUN 16   CREATININE 0.78  CALCIUM 8.6*   GFR: Estimated Creatinine Clearance: 101 mL/min (by C-G formula based on SCr of 0.78 mg/dL). Liver Function Tests: No results for input(s): "AST", "ALT", "ALKPHOS", "BILITOT", "PROT", "ALBUMIN" in the last 168 hours. No results for input(s): "LIPASE", "AMYLASE" in the last 168 hours. No results for input(s): "AMMONIA" in the last 168 hours. Coagulation Profile: No results for input(s): "INR", "PROTIME" in the last 168 hours. Cardiac Enzymes: No results for input(s): "CKTOTAL", "CKMB", "CKMBINDEX", "TROPONINI" in the last 168 hours. BNP (last 3 results) No results for input(s): "PROBNP" in the last 8760 hours. HbA1C: No results for input(s): "HGBA1C" in the last 72 hours. CBG: No results for input(s): "GLUCAP" in the last 168 hours. Lipid Profile: No results for input(s): "CHOL", "HDL", "LDLCALC", "TRIG", "CHOLHDL", "LDLDIRECT" in the last 72 hours. Thyroid Function Tests: No results for input(s): "TSH", "T4TOTAL", "FREET4", "T3FREE", "THYROIDAB" in the last 72 hours. Anemia Panel: No results for input(s): "VITAMINB12", "FOLATE", "FERRITIN", "TIBC", "IRON", "RETICCTPCT" in the last 72 hours. Urine analysis:    Component Value Date/Time   COLORURINE YELLOW 02/05/2023 1900   APPEARANCEUR HAZY (A) 02/05/2023 1900   APPEARANCEUR Clear 04/02/2018 1347   LABSPEC 1.015 02/05/2023 1900   LABSPEC 1.012 07/10/2014 1328   PHURINE 6.0 02/05/2023 1900   GLUCOSEU NEGATIVE 02/05/2023 1900   GLUCOSEU Negative 07/10/2014 1328   HGBUR MODERATE (A) 02/05/2023 1900   BILIRUBINUR NEGATIVE 02/05/2023 1900   BILIRUBINUR Negative 04/02/2018 1347   BILIRUBINUR Negative 07/10/2014  1328   KETONESUR NEGATIVE 02/05/2023 1900   PROTEINUR 30 (A) 02/05/2023 1900   NITRITE NEGATIVE 02/05/2023 1900   LEUKOCYTESUR SMALL (A) 02/05/2023 1900   LEUKOCYTESUR Negative 07/10/2014 1328    Radiological Exams on Admission: DG Chest Portable 1 View  Result Date:  05/25/2023 CLINICAL DATA:  Shortness of breath.  Lung cancer. EXAM: PORTABLE CHEST 1 VIEW COMPARISON:  Chest CT 05/08/2023.  Chest x-ray 03/08/2023. FINDINGS: Similar volume loss right hemithorax. Airspace disease noted right upper lung compatible with the consolidative disease seen on chest CT. Asymmetric elevation right hemidiaphragm. Small right pleural effusion with pleural drain identified at the right lung base. Opacity in the right apex compatible with loculated fluid and lung collapse as seen on the previous CT scan. Right Port-A-Cath again noted with tip position probably in the low right innominate vein. Left lung clear. Cardiopericardial silhouette is at upper limits of normal for size. Telemetry leads overlie the chest. IMPRESSION: 1. Similar volume loss right hemithorax with airspace disease right upper lung compatible with the consolidative disease seen on chest CT. 2. Small right pleural effusion with pleural drain at the right lung base. 3. Opacity in the right apex compatible with loculated fluid and lung collapse as seen on the previous CT scan. Electronically Signed   By: Kennith Center M.D.   On: 05/25/2023 14:22    EKG: Independently reviewed.  A flutter with RVR, no significant ST changes.  Assessment/Plan Principal Problem:   Afib (HCC) Active Problems:   Primary lung adenocarcinoma, right (HCC)   Near syncope   Flutter-fibrillation (HCC)  (please populate well all problems here in Problem List. (For example, if patient is on BP meds at home and you resume or decide to hold them, it is a problem that needs to be her. Same for CAD, COPD, HLD and so on)  A flutter with RVR -Resolved after cardioversion -ED physician discussed case with cardiology, who recommend continue amiodarone drip for today. -Continue metoprolol 50 mg twice daily -Continue Eliquis  Near syncope -Likely secondary to uncontrolled A-fib/a flutter, management as above -Echocardiogram was done 3 months ago,  will not repeat at this point -Recheck blood pressure and orthostatic vital signs  Hypokalemia -P.o. replacement, recheck K level tomorrow -Send magnesium and phosphorus level  Refractory HTN -Blood pressure running high after heart rate restored to normal sinus rhythm.  Resume home BP meds including amlodipine, lisinopril, beta-blocker and spironolactone. -Restart torsemide tomorrow  COPD -No acute concern, continue as needed DuoNebs, continue Breo  Stage IV lung cancer -Outpatient follow-up with oncology for continuous chemo and immunotherapy  DVT prophylaxis: Eliquis Code Status: Full code Family Communication: Son at bedside Disposition Plan: Patient is sick with a flutter status post cardioversion, requiring amiodarone drip and loading, at least for 48 hours, expect more than 2 midnight hospital stay. Consults called: Cardiology Admission status: PCU admission   Emeline General MD [facility] Pager [phone]  05/25/2023, 4:10 PM

## 2023-05-25 NOTE — ED Triage Notes (Signed)
Pt c/o weakness & dizziness off & on x several wks; worse today. Per son, pt gets dizzy when he stands up; he fell in the bathroom about 2 wks ago. Diagnosed with Stage IV lung cancer in April, on chemo.

## 2023-05-25 NOTE — ED Notes (Signed)
Pt taken guaf/codiene w/in the last hour

## 2023-05-26 ENCOUNTER — Inpatient Hospital Stay: Payer: Medicare Other

## 2023-05-26 DIAGNOSIS — K219 Gastro-esophageal reflux disease without esophagitis: Secondary | ICD-10-CM | POA: Diagnosis present

## 2023-05-26 DIAGNOSIS — E119 Type 2 diabetes mellitus without complications: Secondary | ICD-10-CM | POA: Diagnosis present

## 2023-05-26 DIAGNOSIS — Z801 Family history of malignant neoplasm of trachea, bronchus and lung: Secondary | ICD-10-CM | POA: Diagnosis not present

## 2023-05-26 DIAGNOSIS — Z833 Family history of diabetes mellitus: Secondary | ICD-10-CM | POA: Diagnosis not present

## 2023-05-26 DIAGNOSIS — N400 Enlarged prostate without lower urinary tract symptoms: Secondary | ICD-10-CM

## 2023-05-26 DIAGNOSIS — Z87891 Personal history of nicotine dependence: Secondary | ICD-10-CM | POA: Diagnosis not present

## 2023-05-26 DIAGNOSIS — I10 Essential (primary) hypertension: Secondary | ICD-10-CM | POA: Diagnosis present

## 2023-05-26 DIAGNOSIS — E785 Hyperlipidemia, unspecified: Secondary | ICD-10-CM

## 2023-05-26 DIAGNOSIS — Z8619 Personal history of other infectious and parasitic diseases: Secondary | ICD-10-CM | POA: Diagnosis not present

## 2023-05-26 DIAGNOSIS — I4892 Unspecified atrial flutter: Secondary | ICD-10-CM

## 2023-05-26 DIAGNOSIS — C3491 Malignant neoplasm of unspecified part of right bronchus or lung: Secondary | ICD-10-CM | POA: Diagnosis not present

## 2023-05-26 DIAGNOSIS — R000 Tachycardia, unspecified: Secondary | ICD-10-CM | POA: Diagnosis present

## 2023-05-26 DIAGNOSIS — C3411 Malignant neoplasm of upper lobe, right bronchus or lung: Secondary | ICD-10-CM | POA: Diagnosis present

## 2023-05-26 DIAGNOSIS — I4891 Unspecified atrial fibrillation: Secondary | ICD-10-CM | POA: Diagnosis not present

## 2023-05-26 DIAGNOSIS — J4489 Other specified chronic obstructive pulmonary disease: Secondary | ICD-10-CM | POA: Diagnosis present

## 2023-05-26 DIAGNOSIS — Z79899 Other long term (current) drug therapy: Secondary | ICD-10-CM | POA: Diagnosis not present

## 2023-05-26 DIAGNOSIS — Z923 Personal history of irradiation: Secondary | ICD-10-CM | POA: Diagnosis not present

## 2023-05-26 DIAGNOSIS — E876 Hypokalemia: Secondary | ICD-10-CM

## 2023-05-26 DIAGNOSIS — Z7901 Long term (current) use of anticoagulants: Secondary | ICD-10-CM | POA: Diagnosis not present

## 2023-05-26 DIAGNOSIS — M879 Osteonecrosis, unspecified: Secondary | ICD-10-CM | POA: Diagnosis present

## 2023-05-26 DIAGNOSIS — R109 Unspecified abdominal pain: Secondary | ICD-10-CM | POA: Diagnosis present

## 2023-05-26 DIAGNOSIS — R55 Syncope and collapse: Secondary | ICD-10-CM | POA: Diagnosis present

## 2023-05-26 DIAGNOSIS — I2510 Atherosclerotic heart disease of native coronary artery without angina pectoris: Secondary | ICD-10-CM | POA: Diagnosis present

## 2023-05-26 DIAGNOSIS — Z8249 Family history of ischemic heart disease and other diseases of the circulatory system: Secondary | ICD-10-CM | POA: Diagnosis not present

## 2023-05-26 DIAGNOSIS — I480 Paroxysmal atrial fibrillation: Secondary | ICD-10-CM | POA: Diagnosis present

## 2023-05-26 DIAGNOSIS — Z9221 Personal history of antineoplastic chemotherapy: Secondary | ICD-10-CM | POA: Diagnosis not present

## 2023-05-26 DIAGNOSIS — Z8711 Personal history of peptic ulcer disease: Secondary | ICD-10-CM | POA: Diagnosis not present

## 2023-05-26 LAB — BASIC METABOLIC PANEL
Anion gap: 10 (ref 5–15)
BUN: 10 mg/dL (ref 8–23)
CO2: 26 mmol/L (ref 22–32)
Calcium: 8.6 mg/dL — ABNORMAL LOW (ref 8.9–10.3)
Chloride: 100 mmol/L (ref 98–111)
Creatinine, Ser: 0.61 mg/dL (ref 0.61–1.24)
GFR, Estimated: >60 mL/min (ref >60)
Glucose, Bld: 117 mg/dL — ABNORMAL HIGH (ref 70–99)
Potassium: 3.4 mmol/L — ABNORMAL LOW (ref 3.5–5.1)
Sodium: 136 mmol/L (ref 135–145)

## 2023-05-26 MED ORDER — MAGNESIUM OXIDE 400 MG PO CAPS: 400.0000 mg | ORAL_CAPSULE | Freq: Every day | ORAL | 0 refills | Status: DC

## 2023-05-26 MED ORDER — MAGNESIUM SULFATE 2 GM/50ML IV SOLN
2.0000 g | Freq: Once | INTRAVENOUS | Status: AC
  Administered 2023-05-26: 2 g via INTRAVENOUS
  Filled 2023-05-26: qty 50

## 2023-05-26 MED ORDER — AMLODIPINE BESYLATE 10 MG PO TABS: 10.0000 mg | ORAL_TABLET | Freq: Every day | ORAL | 0 refills | Status: DC

## 2023-05-26 MED ORDER — AMIODARONE HCL 200 MG PO TABS
200.0000 mg | ORAL_TABLET | Freq: Two times a day (BID) | ORAL | Status: DC
  Administered 2023-05-26: 200 mg via ORAL
  Filled 2023-05-26: qty 1

## 2023-05-26 MED ORDER — HEPARIN SOD (PORK) LOCK FLUSH 100 UNIT/ML IV SOLN
500.0000 [IU] | Freq: Once | INTRAVENOUS | Status: AC
Start: 2023-05-26 — End: 2023-05-26
  Administered 2023-05-26: 500 [IU] via INTRAVENOUS
  Filled 2023-05-26: qty 5

## 2023-05-26 MED ORDER — POTASSIUM CHLORIDE CRYS ER 20 MEQ PO TBCR
40.0000 meq | EXTENDED_RELEASE_TABLET | Freq: Once | ORAL | Status: AC
  Administered 2023-05-26: 40 meq via ORAL
  Filled 2023-05-26: qty 2

## 2023-05-26 MED ORDER — AMIODARONE HCL 200 MG PO TABS: ORAL_TABLET | ORAL | 0 refills | Status: DC

## 2023-05-26 NOTE — Assessment & Plan Note (Signed)
On pravastatin.

## 2023-05-26 NOTE — ED Notes (Addendum)
Pt ambulated to the bathroom w steady gate. Pt denies SOB, dizziness or lightheadedness. Pt ambulated back to stretcher and reattached to cardiac monitor.  Daughter is at bedside

## 2023-05-26 NOTE — Assessment & Plan Note (Addendum)
With rapid heart rate.  History of paroxysmal atrial fibrillation.  Patient was cardioverted in the ER to normal sinus rhythm.  Was on amiodarone drip overnight.  Cardiology recommended amiodarone 200 mg twice a day for 2 weeks then once a day after that.  Patient already on metoprolol 50 mg twice a day.  Patient already on Eliquis.

## 2023-05-26 NOTE — Discharge Summary (Signed)
Physician Discharge Summary   Patient: Mark Mccarty MRN: [id number] DOB: 07/01/1951  Admit date:     05/25/2023  Discharge date: 05/26/23  Discharge Physician: Alford Highland   PCP: [name], NP   Recommendations at discharge:   Follow-up PCP 5 days Keep appointment with oncology Follow-up Dr. 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  Patient's had some symptoms for 2 to 3 weeks with some dizziness.  In the ER heart rate was 150-180.  Patient was cardioverted by ER physician.  Patient was on amiodarone drip.  Patient seen by cardiology and cleared to go home since in normal sinus rhythm.  Already on Eliquis.  Recommended amiodarone 200 mg twice a day for 2 weeks then once a day after that.  Assessment and Plan: * Flutter-fibrillation (HCC) With rapid heart rate.  History of paroxysmal atrial fibrillation.  Patient was cardioverted in the ER to normal sinus rhythm.  Was on amiodarone drip overnight.  Cardiology recommended amiodarone 200 mg twice a day for 2 weeks then once a day after that.  Patient already on metoprolol 50 mg twice a day.  Patient already on Eliquis.  Primary lung adenocarcinoma, right [facility]) Follow-up oncology as outpatient.  Keep scheduled appointment.  Near syncope Secondary to fast heart rate.  Hypomagnesemia Magnesium replaced IV while here will replace orally upon discharge.  Hypokalemia Potassium replaced orally.  Essential  hypertension Patient on Norvasc, lisinopril, metoprolol, spironolactone.  Hyperlipidemia, unspecified On pravastatin  BPH (benign prostatic hyperplasia) On Flomax         Consultants: Cardiology Procedures performed: Cardioversion by ER physician Disposition: Home Diet recommendation:  Cardiac diet DISCHARGE MEDICATION: Allergies as of 05/26/2023       Reactions   Penicillins Rash   Stomach hurt Has patient had a PCN reaction causing immediate rash, facial/tongue/throat swelling, SOB or lightheadedness with hypotension: yes Has patient had a PCN reaction causing severe rash involving mucus membranes or skin necrosis: no Has patient had a PCN reaction that required hospitalization: no Has patient had a PCN reaction occurring within the last 10 years: yes If all of the above answers are "NO", then may proceed with Cephalosporin use.        Medication List     STOP taking these medications    dexamethasone 4 MG tablet Commonly known as: DECADRON   lidocaine-prilocaine cream Commonly known as: EMLA   LORazepam 0.5 MG tablet Commonly known as: ATIVAN   mupirocin ointment 2 % Commonly known as: BACTROBAN   ondansetron 8 MG tablet Commonly known as: Zofran   Trelegy Ellipta 200-62.5-25 MCG/ACT Aepb Generic drug: Fluticasone-Umeclidin-Vilant       TAKE these medications    acetaminophen 500 MG tablet Commonly known as: TYLENOL Take 500 mg by mouth as needed.   albuterol 108 (90 Base) MCG/ACT inhaler Commonly known as: VENTOLIN HFA Inhale 2 puffs into the lungs every 4 (four) hours as needed.   amiodarone 200 MG tablet Commonly known as: PACERONE One tablet twice a day for two weeks then one tablet daily  after that   amLODipine 10 MG tablet Commonly known as: NORVASC Take 1 tablet (10 mg total) by mouth daily. Start taking on: May 27, 2023   apixaban 5 MG Tabs tablet Commonly known as: ELIQUIS Take 5 mg by mouth 2 (two) times daily.    ascorbic acid 1000 MG tablet Commonly known as: VITAMIN C Take 1,000 mg by mouth daily.   aspirin 81 MG tablet Take 81 mg by mouth daily.   cetirizine 10 MG tablet Commonly known as: ZYRTEC Take 1 tablet by mouth as needed.   cholecalciferol 25 MCG (1000 UNIT) tablet Commonly known as: VITAMIN D3 Take 1,000 Units by mouth daily.   fluticasone 50 MCG/ACT nasal spray Commonly known as: FLONASE Place 1 spray into both nostrils daily.   gabapentin 300 MG capsule Commonly known as: NEURONTIN Take 300 mg by mouth at bedtime as needed.   guaiFENesin-codeine 100-10 MG/5ML syrup Commonly known as: ROBITUSSIN AC Take 5 mLs by mouth 3 (three) times daily as needed for cough.   ipratropium-albuterol 0.5-2.5 (3) MG/3ML Soln Commonly known as: DUONEB Take 3 mLs by nebulization every 6 (six) hours as needed.   lisinopril 40 MG tablet Commonly known as: ZESTRIL Take 40 mg by mouth every morning.   Magnesium Oxide 400 MG Caps Take 1 capsule (400 mg total) by mouth daily.   megestrol 40 MG tablet Commonly known as: MEGACE Take 2 tablets (80 mg total) by mouth 2 (two) times daily.   metoprolol tartrate 50 MG tablet Commonly known as: LOPRESSOR Take 50 mg by mouth 2 (two) times daily.   mirabegron ER 50 MG Tb24 tablet Commonly known as: MYRBETRIQ Take 1 tablet (50 mg total) by mouth daily.   potassium chloride 10 MEQ tablet Commonly known as: KLOR-CON Take 1 tablet (10 mEq total) by mouth daily.   pravastatin 20 MG tablet Commonly known as: PRAVACHOL Take 20 mg by mouth at bedtime.   prochlorperazine 10 MG tablet Commonly known as: COMPAZINE Take 1 tablet (10 mg total) by mouth every 6 (six) hours as needed for nausea or vomiting.   senna-docusate 8.6-50 MG tablet Commonly known as: Senokot-S Take 2 tablets by mouth daily.   spironolactone 25 MG tablet Commonly known as: ALDACTONE Take 25 mg by mouth daily.   tamsulosin 0.4 MG Caps capsule Commonly known as:  FLOMAX Take 0.4 mg by mouth 2 (two) times daily.         Follow-up Information     [name], NP Follow up in 5 day(s).   Specialty: Family Medicine Contact information: [address] [place] [address] [phone]         Marcina Millard, MD Follow up in 2 week(s).   Specialty: Cardiology Contact information: [address] [facility] [place] [address] [phone]                Discharge Exam: Ceasar Mons Weights   05/25/23 1310 05/25/23 1559  Weight: 81.6 kg 94.3 kg   Physical Exam [name]:     Head: Normocephalic.     Mouth/Throat:     Pharynx: No oropharyngeal exudate.  Eyes:     General: Lids are normal.     Conjunctiva/sclera: Conjunctivae normal.  Cardiovascular:     Rate and Rhythm: Normal rate and regular rhythm.     Heart sounds: Normal heart sounds, S1 normal and S2 normal.  Pulmonary:     Breath sounds: Examination of the right-lower field reveals decreased breath sounds. Examination  of the left-lower field reveals decreased breath sounds. Decreased breath sounds present. No rhonchi or rales.  Abdominal:     Palpations: Abdomen is soft.     Tenderness: There is no abdominal tenderness.  Musculoskeletal:     Right lower leg: No swelling.     Left lower leg: No swelling.  Skin:    General: Skin is warm.     Findings: No rash.  Neurological:     Mental Status: He is alert and oriented to person, place, and time.      Condition at discharge: stable  The results of significant diagnostics from this hospitalization (including imaging, microbiology, ancillary and laboratory) are listed below for reference.   Imaging Studies: DG Chest 2 View  Result Date: 05/26/2023 CLINICAL DATA:  CHF. 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 EXAM: CHEST - 2 VIEW COMPARISON:  05/25/23 FINDINGS: There is a right IJ porta catheter with tip in the projection of the proximal SVC.  The tunneled pleural drainage catheter is again noted overlying the right lung base. The loculated right pleural effusion appears mildly increased in volume compared with the prior examination. Unchanged right apical opacification. Airspace opacities within the right upper lung is again noted in appears increased from the previous exam. Left lung is clear. Osseous structures appear intact. IMPRESSION: 1. Mild increase in volume of loculated right pleural effusion with tunneled pleural drainage catheter in place. 2. Increased airspace opacities within the right upper lung. 3. Unchanged right apical opacification. Electronically Signed   By: Signa Kell M.D.   On: 05/26/2023 07:59   DG Chest Portable 1 View  Result Date: 05/25/2023 CLINICAL DATA:  Shortness of breath.  Lung cancer. EXAM: PORTABLE CHEST 1 VIEW COMPARISON:  Chest CT 05/08/2023.  Chest x-ray 03/08/2023. FINDINGS: Similar volume loss right hemithorax. Airspace disease noted right upper lung compatible with the consolidative disease seen on chest CT. Asymmetric elevation right hemidiaphragm. Small right pleural effusion with pleural drain identified at the right lung base. Opacity in the right apex compatible with loculated fluid and lung collapse as seen on the previous CT scan. Right Port-A-Cath again noted with tip position probably in the low right innominate vein. Left lung clear. Cardiopericardial silhouette is at upper limits of normal for size. Telemetry leads overlie the chest. IMPRESSION: 1. Similar volume loss right hemithorax with airspace disease right upper lung compatible with the consolidative disease seen on chest CT. 2. Small right pleural effusion with pleural drain at the right lung base. 3. Opacity in the right apex compatible with loculated fluid and lung collapse as seen on the previous CT scan. Electronically Signed   By: Kennith Center M.D.   On: 05/25/2023 14:22   CT HEAD WO CONTRAST ( )  Result Date:  05/21/2023 CLINICAL DATA:  Status post fall. Dizziness. On blood thinners. History of lung cancer. EXAM: CT HEAD WITHOUT CONTRAST TECHNIQUE: Contiguous axial images were obtained from the base of the skull through the vertex without intravenous contrast. RADIATION DOSE REDUCTION: This exam was performed according to the departmental dose-optimization program which includes automated exposure control, adjustment of the mA and/or kV according to patient size and/or use of iterative reconstruction technique. COMPARISON:  Head MRI 12/05/2022 FINDINGS: Brain: There is no evidence of an acute infarct, intracranial hemorrhage, mass, midline shift, or extra-axial fluid collection. Slight cerebral atrophy is within normal limits for age. 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 Cerebral white matter hypodensities are nonspecific but compatible  with minimal chronic small vessel ischemic disease. Vascular: No hyperdense vessel. Skull: No acute fracture or suspicious osseous lesion. Sinuses/Orbits: Minimal fluid in the left sphenoid sinus. Clear mastoid air cells. Left cataract extraction. Other: None. IMPRESSION: No evidence of acute intracranial abnormality. Electronically Signed   By: Sebastian Ache M.D.   On: 05/21/2023 13:18   CT CHEST ABDOMEN PELVIS W CONTRAST  Result Date: 05/10/2023 CLINICAL DATA:  Right lung cancer restaging * Tracking Code: BO * EXAM: CT CHEST, ABDOMEN, AND PELVIS WITH CONTRAST TECHNIQUE: Multidetector CT imaging of the chest, abdomen and pelvis was performed following the standard protocol during bolus administration of intravenous contrast. RADIATION DOSE REDUCTION: This exam was performed according to the departmental dose-optimization program which includes automated exposure control, adjustment of the mA and/or kV according to patient size and/or use of iterative reconstruction technique. CONTRAST:  80mL OMNIPAQUE IOHEXOL 300 MG/ML  SOLN COMPARISON:  02/18/2023 (chest) and  11/16/2022 (abdomen/pelvis). FINDINGS: CT CHEST FINDINGS Cardiovascular: The Port-A-Cath catheter appears to loop up into the right internal jugular vein and extends caudad to terminate in the right brachiocephalic vein, similar to prior exams including 02/01/2023. Atheromatous vascular calcification of the thoracic aorta. Stable small pericardial effusion. Mediastinum/Nodes: Persistent hazy density around the right mainstem bronchus, without a discretely measurable lymph node or lesion in this vicinity. Lymph nodes just outside of the pericardium in the mediastinal adipose tissue measure up to 1.4 cm in short axis on image 47 series 2, previously 0.9 cm. There is some high density nodularity along the right anterior pericardium for example on image 46 series 2, possibly from pericardial lesion or adjacent lymph node. Previously hypermetabolic AP window lymph node 0.6 cm in short axis on image 28 series 2, formerly 0.8 cm on 02/18/2023. Lungs/Pleura: Collapsed and partially consolidated right upper lobe, hypodense medial portion measures about 1.7 cm in thickness on image 25 series 2, formerly about 2.2 cm in thickness. This hypodense portion the may represent original tumor but is indistinctly marginated against the atelectatic lung. The right upper lobe bronchus is truncated. Reduced size right pleural effusion with a right pleural drainage catheter in place. Currently the effusion is small to moderate. Notable enhancement along the parietal pleura. Trace gas along the pleural space at the lung base. Emphysema. Previously hypermetabolic posterior consolidation in the superior segment right lower lobe and posteriorly in the right middle lobe, this along was previously atelectatic. Current appearance could be from pneumonia or aspiration pneumonitis, versus prior radiation therapy. Scattered pleural nodularity anteriorly along the right middle lobe, small pleural deposits of tumor not excluded. Subpleural nodularity  in the right lower lobe observed, index lesion 5 mm in diameter on image 117 series 4. Stable left apical pleuroparenchymal scarring. A 3.0 by 2.0 cm masslike consolidation in the superior segment left lower lobe previously measured 2.7 by 1.9 cm by my measurements. Accordingly this is mildly enlarged. Musculoskeletal: Lower thoracic spondylosis. Probable sebaceous cyst or similar benign lesion along the junction of the right posterior neck and chest, 1.2 cm in short axis on image 1 series 2, previously not hypermetabolic. 0.9 cm sclerotic left eleventh rib lesion medially on image 57 series 2, not previously hypermetabolic. Probably a bone island. CT ABDOMEN PELVIS FINDINGS Hepatobiliary: Contracted gallbladder.  Otherwise unremarkable. Pancreas: Unremarkable Spleen: Unremarkable Adrenals/Urinary Tract: Fullness of both adrenal glands without a discrete mass. The kidneys appear unremarkable. Stomach/Bowel: Scattered colonic diverticula. Vascular/Lymphatic: Small right gastric node 0.7 cm in short axis on image 65 series 2 Atherosclerosis is present,  including aortoiliac atherosclerotic disease. Reproductive: Prominent median lobe of the prostate gland indents the bladder base. Other: Unremarkable Musculoskeletal: Left-sided lipoma interposed between the gluteus maximus and the greater trochanter. Hemangioma eccentric to the left in the L2 vertebral body, image 78 series 2. Probable bone island in the right iliac bone. Chronic avascular necrosis of both femoral heads. Degenerative endplate sclerosis inferiorly along the L3 endplate with a sclerotic lesion eccentric to the left in the L3 vertebral body which is nonspecific but not previously hypermetabolic. Probable superficial sebaceous cyst along the right upper thigh/groin region, 1.2 cm in short axis on image 132 series 2, present and not previously hypermetabolic PET-CT 12/03/2022. IMPRESSION: 1. Reduced size of the large right upper lobe central tumor,  currently 1.7 cm in transverse thickness and previously 2.2 cm. 2. Reduced size right pleural effusion with a right pleural drainage catheter in place. Currently the effusion is small to moderate. Enhancing right pleural metastatic foci. Trace gas along the pleural space at the lung base. 3. Airspace opacity in the superior segment right lower lobe and posteriorly in the right middle lobe, this along was previously atelectatic. Pneumonia not excluded. 4. Subpleural nodularity in the right lower lobe and right middle lobe observed, index lesion 5 mm in diameter. 5. Mild enlargement of a masslike consolidation in the superior segment left lower lobe, currently 3.0 by 2.0 cm, previously 2.7 by 1.9 cm. 6. Persistent hazy density around the right mainstem bronchus, without a discretely measurable lymph node or lesion in this vicinity. 7. Lymph nodes just outside of the pericardium in the mediastinal adipose tissue measure up to 1.4 cm in short axis, previously 0.9 cm. There is some high density nodularity along the right anterior pericardium for example, possibly from pericardial lesion or adjacent lymph node. 8. Stable small pericardial effusion. 9. Emphysema. 10. Chronic avascular necrosis of both femoral heads. 11. Prominent median lobe of the prostate gland indents the bladder base. 12. Scattered colonic diverticula. 13. Aortic atherosclerosis. Aortic Atherosclerosis (ICD10-I70.0) and Emphysema (ICD10-J43.9). Electronically Signed   By: Gaylyn Rong M.D.   On: 05/10/2023 08:47       Labs: CBC: Recent Labs  Lab 05/25/23 1332  WBC 10.3  HGB 12.8*  HCT 40.0  MCV 92.0  PLT 306   Basic Metabolic Panel: Recent Labs  Lab 05/25/23 1423 05/26/23 0654  NA 137 136  K 3.4* 3.4*  CL 99 100  CO2 28 26  GLUCOSE 143* 117*  BUN 16 10  CREATININE 0.78 0.61  CALCIUM 8.6* 8.6*  MG 1.6*  --   PHOS 3.4  --      Discharge time spent: greater than 30 minutes.  Signed: Alford Highland, MD [facility] 05/26/2023

## 2023-05-26 NOTE — Assessment & Plan Note (Addendum)
Follow-up oncology as outpatient.  Keep scheduled appointment.

## 2023-05-26 NOTE — Assessment & Plan Note (Signed)
Magnesium replaced IV while here will replace orally upon discharge.

## 2023-05-26 NOTE — Assessment & Plan Note (Signed)
Secondary to fast heart rate.

## 2023-05-26 NOTE — Assessment & Plan Note (Signed)
Patient on Norvasc, lisinopril, metoprolol, spironolactone.

## 2023-05-26 NOTE — Hospital Course (Signed)
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  Patient's had some symptoms for 2 to 3 weeks with some dizziness.  In the ER heart rate was 150-180.  Patient was cardioverted by ER physician.  Patient was on amiodarone drip.  Patient seen by cardiology and cleared to go home since in normal sinus rhythm.  Already on Eliquis.  Recommended amiodarone 200 mg twice a day for 2 weeks then once a day after that.

## 2023-05-26 NOTE — Assessment & Plan Note (Signed)
Potassium replaced orally

## 2023-05-26 NOTE — Consult Note (Signed)
CARDIOLOGY CONSULT NOTE               Patient ID: Mark Mccarty MRN: [id number] DOB/AGE: [birth year] 72 y.o.  Admit date: 05/25/2023 Referring Physician Dr. Mikey Mccarty hospitalist Primary Physician Mark Everts, NP Primary Cardiologist Dr. Darrold Mccarty Reason for Consultation atrial fibrillation rapid ventricular response  HPI: Patient is a 72 year old male history of stage IV right upper lung adenocarcinoma on chemo and immunotherapy last infusion was 2 weeks ago he has had paroxysmal atrial fibrillation on Eliquis for anticoagulation metoprolol for rate recently had an elective cardioversion after TEE patient reverted back to rapid atrial fibrillation and had an episode of near syncope so came to the emergency room his rate was above 150 patient was placed on heparin for anticoagulation and additional weight management was undertaken the patient underwent electrical cardioversion in the emergency room reverted back to sinus rhythm and has maintained sinus rhythm since he was placed on IV amiodarone drip as well as increased dose of metoprolol he has history of COPD hypertension coronary disease states he feels back to normal has had some mild episode of electrolyte imbalance denies any chest pain feels slightly weak.  Relatively back to normal  Review of systems complete and found to be negative unless listed above     Past Medical History:  Diagnosis Date   Abnormal nuclear stress test    Anxiety    a.) on BZO (lorazepam) PRN   Asthma    Avascular necrosis of femoral head (HCC)    BPH (benign prostatic hyperplasia)    Cataract    a.) s/p extraction on LEFT   Colon adenomas    COPD (chronic obstructive pulmonary disease) (HCC)    Coronary artery disease 11/11/2014   a.) MPI 11/11/2014: EF 56%, mild inf wall ischemia; b.) LHC 12/16/2014: 20% mRCA - med mgmt   DDD (degenerative disc disease), lumbar    Diverticulosis    DM type 2 (diabetes mellitus, type 2) (HCC)     Duodenitis    Dysphagia    Dyspnea    ED (erectile dysfunction)    a.) on PDE5i (sildenafil) PRN   Elevated PSA    Former smoker    Gastritis    GERD (gastroesophageal reflux disease)    [name]    Hilar mass    History of hiatal hernia    Hyperlipidemia    Hypertension    Hyperuricemia    Hypokalemia    Left ventricular diastolic dysfunction 10/04/2016   a.) TTE 10/04/2016: EF 50%, mild MR/TR/PR, mod AR, G1DD; b.) TTE 10/10/2017: EF 45%, triv PR, mild AR/MR/TR, G1DD; c.) TTE 03/30/2020: EF 50%, mild-mod AR, mild MR/TR/PR; d.) TTE 04/23/2022: EF >55%, mild LVH, mild LAE, triv TR, mild PR, mod AR/MR   Liver lesion    Obesity    Pedal edema    Peripheral polyneuropathy    Peyronie's disease 05/2022   Pleural effusion    Primary lung adenocarcinoma, right (HCC)    a.) stage IIIA (cT3, cN1, cM0) --> s/p concurrent chemoradiation 2019 --> carboplatin + pacilitaxel (12/31/2017 - 02/19/2018) followed by 1 year (03/24/2018 - 03/17/2019) adjuvant anti-PDL-1 mAB immunotherapy (durvalumab)   [name] (peptic ulcer disease)    Renal insufficiency    Umbilical hernia    [name]     Past Surgical History:  Procedure Laterality Date   CARDIAC CATHETERIZATION N/A 12/16/2014   Procedure: Left Heart Cath;  Surgeon: Mark Millard, MD;  Location: [facility];  Service: Cardiovascular;  Laterality: N/A;   CARDIOVERSION N/A 01/17/2023   Procedure: CARDIOVERSION;  Surgeon: Mark Dieter, DO;  Location: [name];  Service: Cardiovascular;  Laterality: N/A;   CATARACT EXTRACTION Left    COLONOSCOPY     COLONOSCOPY WITH PROPOFOL N/A 01/14/2020   Procedure: COLONOSCOPY WITH PROPOFOL;  Surgeon: Toledo, Boykin Nearing, MD;  Location: [facility];  Service: Gastroenterology;  Laterality: N/A;   CYST REMOVAL TRUNK     chest and back over time and it was removed   CYSTECTOMY     ENDOBRONCHIAL ULTRASOUND N/A 12/09/2017   Procedure: ENDOBRONCHIAL ULTRASOUND;   Surgeon: Mark Crutch, MD;  Location: [name];  Service: Pulmonary;  Laterality: N/A;   IR GUIDED DRAIN W CATHETER PLACEMENT  03/21/2023   PORTA CATH INSERTION N/A 12/23/2017   Procedure: PORTA CATH INSERTION;  Surgeon: Mark Needy, MD;  Location: [facility];  Service: Cardiovascular;  Laterality: N/A;   PORTA CATH INSERTION N/A 01/08/2023   Procedure: PORTA CATH INSERTION;  Surgeon: Mark Needy, MD;  Location: [facility];  Service: Cardiovascular;  Laterality: N/A;   PORTA CATH REMOVAL N/A 06/04/2022   Procedure: PORTA CATH REMOVAL;  Surgeon: Mark Needy, MD;  Location: [facility];  Service: Cardiovascular;  Laterality: N/A;   TEE WITHOUT CARDIOVERSION N/A 01/17/2023   Procedure: TRANSESOPHAGEAL ECHOCARDIOGRAM (TEE);  Surgeon: Mark Dieter, DO;  Location: [name];  Service: Cardiovascular;  Laterality: N/A;   UPPER GI ENDOSCOPY     VIDEO BRONCHOSCOPY WITH ENDOBRONCHIAL ULTRASOUND N/A 12/17/2022   Procedure: VIDEO BRONCHOSCOPY WITH ENDOBRONCHIAL ULTRASOUND;  Surgeon: Mark Rigger, MD;  Location: [name];  Service: Thoracic;  Laterality: N/A;    (Not in a hospital admission)  Social History   Socioeconomic History   Marital status: Divorced    Spouse name: Not on file   Number of children: Not on file   Years of education: Not on file   Highest education level: Not on file  Occupational History   Not on file  Tobacco Use   Smoking status: Former    Current packs/day: 0.00    Average packs/day: 1.5 packs/day for 35.0 years (52.5 ttl pk-yrs)    Types: Cigarettes    Start date: 06/21/1980    Quit date: 06/22/2015    Years since quitting: 7.9    Passive exposure: Past   Smokeless tobacco: Never  Vaping Use   Vaping status: Never Used  Substance and Sexual Activity   Alcohol use: Not Currently    Comment: weekends   Drug use: No   Sexual activity: Not Currently  Other Topics Concern   Not on file  Social History Narrative   Lives  with son Mark Mccarty.   Social Determinants of Health   Financial Resource Strain: Not on file  Food Insecurity: Not on file  Transportation Needs: Not on file  Physical Activity: Not on file  Stress: Not on file  Social Connections: Unknown (11/14/2021)   Received from [facility]   Social Network    Social Network: Not on file  Intimate Partner Violence: Unknown (10/05/2021)   [facility], [facility]   HITS    Physically Hurt: Not on file    Insult or Talk Down To: Not on file    Threaten Physical Harm: Not on file    Scream or Curse: Not on file    Family History  Problem Relation Age of Onset   Hypertension Mother  Breast cancer Mother    Heart attack Mother    Lung cancer Father    Heart disease Sister    Diabetes Sister    Colon cancer Sister       Review of systems complete and found to be negative unless listed above      PHYSICAL EXAM  General: Well developed, well nourished, in no acute distress HEENT:  Normocephalic and atramatic Neck:  No JVD.  Lungs: Clear bilaterally to auscultation and percussion. Heart: HRRR . Normal S1 and S2 without gallops or murmurs.  Abdomen: Bowel sounds are positive, abdomen soft and non-tender  Msk:  Back normal, normal gait. Normal strength and tone for age. Extremities: No clubbing, cyanosis or edema.   Neuro: Alert and oriented X 3. Psych:  Good affect, responds appropriately  Labs:   Lab Results  Component Value Date   WBC 10.3 05/25/2023   HGB 12.8 (L) 05/25/2023   HCT 40.0 05/25/2023   MCV 92.0 05/25/2023   PLT 306 05/25/2023    Recent Labs  Lab 05/26/23 0654  NA 136  K 3.4*  CL 100  CO2 26  BUN 10  CREATININE 0.61  CALCIUM 8.6*  GLUCOSE 117*   Lab Results  Component Value Date   TROPONINI < 0.02 07/10/2014   No results found for: "CHOL" No results found for: "HDL" No results found for: "LDLCALC" No results found for: "TRIG" No results found for: "CHOLHDL" No  results found for: "LDLDIRECT"    Radiology: DG Chest 2 View  Result Date: 05/26/2023 CLINICAL DATA:  CHF. 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 EXAM: CHEST - 2 VIEW COMPARISON:  05/25/23 FINDINGS: There is a right IJ porta catheter with tip in the projection of the proximal SVC. The tunneled pleural drainage catheter is again noted overlying the right lung base. The loculated right pleural effusion appears mildly increased in volume compared with the prior examination. Unchanged right apical opacification. Airspace opacities within the right upper lung is again noted in appears increased from the previous exam. Left lung is clear. Osseous structures appear intact. IMPRESSION: 1. Mild increase in volume of loculated right pleural effusion with tunneled pleural drainage catheter in place. 2. Increased airspace opacities within the right upper lung. 3. Unchanged right apical opacification. Electronically Signed   By: Signa Kell M.D.   On: 05/26/2023 07:59   DG Chest Portable 1 View  Result Date: 05/25/2023 CLINICAL DATA:  Shortness of breath.  Lung cancer. EXAM: PORTABLE CHEST 1 VIEW COMPARISON:  Chest CT 05/08/2023.  Chest x-ray 03/08/2023. FINDINGS: Similar volume loss right hemithorax. Airspace disease noted right upper lung compatible with the consolidative disease seen on chest CT. Asymmetric elevation right hemidiaphragm. Small right pleural effusion with pleural drain identified at the right lung base. Opacity in the right apex compatible with loculated fluid and lung collapse as seen on the previous CT scan. Right Port-A-Cath again noted with tip position probably in the low right innominate vein. Left lung clear. Cardiopericardial silhouette is at upper limits of normal for size. Telemetry leads overlie the chest. IMPRESSION: 1. Similar volume loss right hemithorax with airspace disease right upper lung compatible with the consolidative disease seen on chest  CT. 2. Small right pleural effusion with pleural drain at the right lung base. 3. Opacity in the right apex compatible with loculated fluid and lung collapse as seen on the previous CT scan. Electronically Signed   By: Kennith Center M.D.   On: 05/25/2023  14:22   CT HEAD WO CONTRAST ( )  Result Date: 05/21/2023 CLINICAL DATA:  Status post fall. Dizziness. On blood thinners. History of lung cancer. EXAM: CT HEAD WITHOUT CONTRAST TECHNIQUE: Contiguous axial images were obtained from the base of the skull through the vertex without intravenous contrast. RADIATION DOSE REDUCTION: This exam was performed according to the departmental dose-optimization program which includes automated exposure control, adjustment of the mA and/or kV according to patient size and/or use of iterative reconstruction technique. COMPARISON:  Head MRI 12/05/2022 FINDINGS: Brain: There is no evidence of an acute infarct, intracranial hemorrhage, mass, midline shift, or extra-axial fluid collection. Slight cerebral atrophy is within normal limits for age. 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 Cerebral white matter hypodensities are nonspecific but compatible with minimal chronic small vessel ischemic disease. Vascular: No hyperdense vessel. Skull: No acute fracture or suspicious osseous lesion. Sinuses/Orbits: Minimal fluid in the left sphenoid sinus. Clear mastoid air cells. Left cataract extraction. Other: None. IMPRESSION: No evidence of acute intracranial abnormality. Electronically Signed   By: Sebastian Ache M.D.   On: 05/21/2023 13:18   CT CHEST ABDOMEN PELVIS W CONTRAST  Result Date: 05/10/2023 CLINICAL DATA:  Right lung cancer restaging * Tracking Code: BO * EXAM: CT CHEST, ABDOMEN, AND PELVIS WITH CONTRAST TECHNIQUE: Multidetector CT imaging of the chest, abdomen and pelvis was performed following the standard protocol during bolus administration of intravenous contrast. RADIATION DOSE REDUCTION: This exam  was performed according to the departmental dose-optimization program which includes automated exposure control, adjustment of the mA and/or kV according to patient size and/or use of iterative reconstruction technique. CONTRAST:  80mL OMNIPAQUE IOHEXOL 300 MG/ML  SOLN COMPARISON:  02/18/2023 (chest) and 11/16/2022 (abdomen/pelvis). FINDINGS: CT CHEST FINDINGS Cardiovascular: The Port-A-Cath catheter appears to loop up into the right internal jugular vein and extends caudad to terminate in the right brachiocephalic vein, similar to prior exams including 02/01/2023. Atheromatous vascular calcification of the thoracic aorta. Stable small pericardial effusion. Mediastinum/Nodes: Persistent hazy density around the right mainstem bronchus, without a discretely measurable lymph node or lesion in this vicinity. Lymph nodes just outside of the pericardium in the mediastinal adipose tissue measure up to 1.4 cm in short axis on image 47 series 2, previously 0.9 cm. There is some high density nodularity along the right anterior pericardium for example on image 46 series 2, possibly from pericardial lesion or adjacent lymph node. Previously hypermetabolic AP window lymph node 0.6 cm in short axis on image 28 series 2, formerly 0.8 cm on 02/18/2023. Lungs/Pleura: Collapsed and partially consolidated right upper lobe, hypodense medial portion measures about 1.7 cm in thickness on image 25 series 2, formerly about 2.2 cm in thickness. This hypodense portion the may represent original tumor but is indistinctly marginated against the atelectatic lung. The right upper lobe bronchus is truncated. Reduced size right pleural effusion with a right pleural drainage catheter in place. Currently the effusion is small to moderate. Notable enhancement along the parietal pleura. Trace gas along the pleural space at the lung base. Emphysema. Previously hypermetabolic posterior consolidation in the superior segment right lower lobe and  posteriorly in the right middle lobe, this along was previously atelectatic. Current appearance could be from pneumonia or aspiration pneumonitis, versus prior radiation therapy. Scattered pleural nodularity anteriorly along the right middle lobe, small pleural deposits of tumor not excluded. Subpleural nodularity in the right lower lobe observed, index lesion 5 mm in diameter on image 117 series 4. Stable left apical pleuroparenchymal  scarring. A 3.0 by 2.0 cm masslike consolidation in the superior segment left lower lobe previously measured 2.7 by 1.9 cm by my measurements. Accordingly this is mildly enlarged. Musculoskeletal: Lower thoracic spondylosis. Probable sebaceous cyst or similar benign lesion along the junction of the right posterior neck and chest, 1.2 cm in short axis on image 1 series 2, previously not hypermetabolic. 0.9 cm sclerotic left eleventh rib lesion medially on image 57 series 2, not previously hypermetabolic. Probably a bone island. CT ABDOMEN PELVIS FINDINGS Hepatobiliary: Contracted gallbladder.  Otherwise unremarkable. Pancreas: Unremarkable Spleen: Unremarkable Adrenals/Urinary Tract: Fullness of both adrenal glands without a discrete mass. The kidneys appear unremarkable. Stomach/Bowel: Scattered colonic diverticula. Vascular/Lymphatic: Small right gastric node 0.7 cm in short axis on image 65 series 2 Atherosclerosis is present, including aortoiliac atherosclerotic disease. Reproductive: Prominent median lobe of the prostate gland indents the bladder base. Other: Unremarkable Musculoskeletal: Left-sided lipoma interposed between the gluteus maximus and the greater trochanter. Hemangioma eccentric to the left in the L2 vertebral body, image 78 series 2. Probable bone island in the right iliac bone. Chronic avascular necrosis of both femoral heads. Degenerative endplate sclerosis inferiorly along the L3 endplate with a sclerotic lesion eccentric to the left in the L3 vertebral body  which is nonspecific but not previously hypermetabolic. Probable superficial sebaceous cyst along the right upper thigh/groin region, 1.2 cm in short axis on image 132 series 2, present and not previously hypermetabolic PET-CT 12/03/2022. IMPRESSION: 1. Reduced size of the large right upper lobe central tumor, currently 1.7 cm in transverse thickness and previously 2.2 cm. 2. Reduced size right pleural effusion with a right pleural drainage catheter in place. Currently the effusion is small to moderate. Enhancing right pleural metastatic foci. Trace gas along the pleural space at the lung base. 3. Airspace opacity in the superior segment right lower lobe and posteriorly in the right middle lobe, this along was previously atelectatic. Pneumonia not excluded. 4. Subpleural nodularity in the right lower lobe and right middle lobe observed, index lesion 5 mm in diameter. 5. Mild enlargement of a masslike consolidation in the superior segment left lower lobe, currently 3.0 by 2.0 cm, previously 2.7 by 1.9 cm. 6. Persistent hazy density around the right mainstem bronchus, without a discretely measurable lymph node or lesion in this vicinity. 7. Lymph nodes just outside of the pericardium in the mediastinal adipose tissue measure up to 1.4 cm in short axis, previously 0.9 cm. There is some high density nodularity along the right anterior pericardium for example, possibly from pericardial lesion or adjacent lymph node. 8. Stable small pericardial effusion. 9. Emphysema. 10. Chronic avascular necrosis of both femoral heads. 11. Prominent median lobe of the prostate gland indents the bladder base. 12. Scattered colonic diverticula. 13. Aortic atherosclerosis. Aortic Atherosclerosis (ICD10-I70.0) and Emphysema (ICD10-J43.9). Electronically Signed   By: Gaylyn Rong M.D.   On: 05/10/2023 08:47    EKG: Normal sinus rhythm rate of about 90 nonspecific ST-T wave changes status post recent cardioversion from atrial  fibrillation  ASSESSMENT AND PLAN:  Atrial fibrillation rapid ventricular response Status post cardioversion COPD Hypertension Coronary artery disease Right upper lung adenocarcinoma Chemo and immunotherapy Generalized weakness Asthma Diabetes type 2 Ex-smoker GERD .  Plan Agree with emergent cardioversion in emergency room since patient is adequately anticoagulated Agree with amiodarone therapy IV load then switched to p.o. load Rate control with metoprolol continue current management Maintain anticoagulation with heparin with transition back to Eliquis prior to discharge Echocardiogram recently showed preserved left  ventricular function will continue current management Atrial fibrillation potentially related to cardiac irritation from chemotherapy radiation and lung cancer Do not recommend referral for ablation at this point because of the underlying comorbid disease Inhalers as necessary for COPD Continue advised patient refrain from tobacco abuse Signed: Alwyn Pea MD 05/26/2023, 2:17 PM

## 2023-05-26 NOTE — Assessment & Plan Note (Signed)
On Flomax

## 2023-05-26 NOTE — TOC Initial Note (Signed)
Transition of Care [facility]) - Initial/Assessment Note    Patient Details  Name: Mark Mccarty MRN: [id number] Date of Birth: 06-02-1951  [facility]) CM/SW Contact:    Colette Ribas, LCSWA Phone Number: 05/26/2023, 3:33 PM  Clinical Narrative:                    MS advised patient needs HH, He will add PT, spoke with family bedside patient has no preference but one that will accept isnurance. Daugther requested follow-up after acceptance. Spoke with Laurelyn Sickle [facility] who advised she will take referral. Relayed to Daughter.     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   Afib (HCC) 05/25/2023   Drug-induced neutropenia (HCC) 03/18/2023   Encounter for antineoplastic chemotherapy 01/01/2023   Weight loss 01/01/2023   Pleural effusion 12/17/2022   Anxiety associated with cancer diagnosis (HCC) 11/21/2022   Port-A-Cath in place 03/15/2020   Liver lesion 03/15/2020   DDD (degenerative disc disease), lumbar 08/04/2019   Obesity (BMI 30.0-34.9) 08/04/2019   Umbilical hernia without obstruction and without gangrene 04/07/2019   Centrilobular emphysema (HCC) 03/18/2019   Peripheral polyneuropathy 03/18/2019   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   H/O adenomatous polyp of colon 01/20/2019   Avascular necrosis of femoral head (HCC) 12/10/2018   [name] 09/18/2018    Elevated PSA, less than 10 ng/ml 03/22/2018   Benign prostatic hyperplasia with urinary obstruction 03/19/2018   Benign prostatic hyperplasia with nocturia 03/19/2018   Hypokalemia 01/07/2018   Goals of care, counseling/discussion 12/13/2017   Primary lung adenocarcinoma, right (HCC) 12/13/2017   SOB (shortness of breath) on exertion 10/02/2017   Incomplete immunization status 09/14/2017   Pedal edema 12/04/2016   Hyperuricemia 05/02/2016   Type 2 diabetes mellitus with other specified complication (HCC) 05/02/2016   High risk medication use 09/12/2015   S/P cardiac catheterization 12/23/2014   Chest pain with high risk for cardiac etiology 12/16/2014   Abnormal nuclear stress test 12/16/2014   Adenomatous polyp of colon 04/02/2014   Dysphagia 04/02/2014   Family history of colon cancer 03/06/2014   Gastroesophageal reflux 03/06/2014   Hyperlipidemia associated with type 2 diabetes mellitus (HCC) 03/06/2014   Hypertension associated with type 2 diabetes mellitus (HCC) 03/06/2014   Aortic insufficiency 01/20/2014   Erectile dysfunction 01/20/2014   Former smoker 01/20/2014   Irregular heart beat 01/20/2014   Left ventricular diastolic dysfunction 01/20/2014   Peptic ulcer disease 01/20/2014   PCP:  [name], NP Pharmacy:   [facility]  [place], [place] - [address] [place] [address] Phone: [phone] Fax: [phone]  [facility]) - [place], [place] - [address] [facility] [address] [place] [address] [place] [address] Phone: [phone] Fax: [phone]  [facility] [address], [place] - [address] [place] [address] Phone: [phone] Fax: [phone]     Social Determinants of Health (SDOH) Social History: SDOH Screenings   Social Connections: Unknown (11/14/2021)   Received from [facility], [facility]  Tobacco Use: Medium Risk (05/25/2023)   SDOH  Interventions:     Readmission Risk Interventions     No data to display

## 2023-05-27 MED FILL — Fosaprepitant Dimeglumine For IV Infusion 150 MG (Base Eq): INTRAVENOUS | Qty: 5 | Status: AC

## 2023-05-28 ENCOUNTER — Inpatient Hospital Stay: Payer: Medicare Other

## 2023-05-28 ENCOUNTER — Telehealth: Payer: Self-pay

## 2023-05-28 ENCOUNTER — Encounter: Payer: Self-pay | Admitting: Oncology

## 2023-05-28 ENCOUNTER — Inpatient Hospital Stay (HOSPITAL_BASED_OUTPATIENT_CLINIC_OR_DEPARTMENT_OTHER): Payer: Medicare Other | Admitting: Oncology

## 2023-05-28 VITALS — BP 121/64 | HR 82

## 2023-05-28 VITALS — BP 89/55 | HR 91 | Temp 96.9°F | Resp 18 | Wt 171.9 lb

## 2023-05-28 DIAGNOSIS — J90 Pleural effusion, not elsewhere classified: Secondary | ICD-10-CM | POA: Diagnosis not present

## 2023-05-28 DIAGNOSIS — C3491 Malignant neoplasm of unspecified part of right bronchus or lung: Secondary | ICD-10-CM

## 2023-05-28 DIAGNOSIS — Z5112 Encounter for antineoplastic immunotherapy: Secondary | ICD-10-CM | POA: Diagnosis not present

## 2023-05-28 DIAGNOSIS — B370 Candidal stomatitis: Secondary | ICD-10-CM | POA: Insufficient documentation

## 2023-05-28 DIAGNOSIS — I959 Hypotension, unspecified: Secondary | ICD-10-CM | POA: Insufficient documentation

## 2023-05-28 DIAGNOSIS — Z95828 Presence of other vascular implants and grafts: Secondary | ICD-10-CM

## 2023-05-28 DIAGNOSIS — R634 Abnormal weight loss: Secondary | ICD-10-CM | POA: Diagnosis not present

## 2023-05-28 DIAGNOSIS — R531 Weakness: Secondary | ICD-10-CM

## 2023-05-28 LAB — CBC WITH DIFFERENTIAL (CANCER CENTER ONLY)
Abs Immature Granulocytes: 0.06 10*3/uL (ref 0.00–0.07)
Basophils Absolute: 0 10*3/uL (ref 0.0–0.1)
Basophils Relative: 0 %
Eosinophils Absolute: 0 10*3/uL (ref 0.0–0.5)
Eosinophils Relative: 0 %
HCT: 31.5 % — ABNORMAL LOW (ref 39.0–52.0)
Hemoglobin: 10.1 g/dL — ABNORMAL LOW (ref 13.0–17.0)
Immature Granulocytes: 1 %
Lymphocytes Relative: 3 %
Lymphs Abs: 0.3 10*3/uL — ABNORMAL LOW (ref 0.7–4.0)
MCH: 29 pg (ref 26.0–34.0)
MCHC: 32.1 g/dL (ref 30.0–36.0)
MCV: 90.5 fL (ref 80.0–100.0)
Monocytes Absolute: 1.1 10*3/uL — ABNORMAL HIGH (ref 0.1–1.0)
Monocytes Relative: 11 %
Neutro Abs: 8.3 10*3/uL — ABNORMAL HIGH (ref 1.7–7.7)
Neutrophils Relative %: 85 %
Platelet Count: 257 10*3/uL (ref 150–400)
RBC: 3.48 MIL/uL — ABNORMAL LOW (ref 4.22–5.81)
RDW: 15.6 % — ABNORMAL HIGH (ref 11.5–15.5)
WBC Count: 9.9 10*3/uL (ref 4.0–10.5)
nRBC: 0 % (ref 0.0–0.2)

## 2023-05-28 LAB — CMP (CANCER CENTER ONLY)
ALT: 15 U/L (ref 0–44)
AST: 18 U/L (ref 15–41)
Albumin: 2.3 g/dL — ABNORMAL LOW (ref 3.5–5.0)
Alkaline Phosphatase: 51 U/L (ref 38–126)
Anion gap: 10 (ref 5–15)
BUN: 10 mg/dL (ref 8–23)
CO2: 26 mmol/L (ref 22–32)
Calcium: 8.8 mg/dL — ABNORMAL LOW (ref 8.9–10.3)
Chloride: 99 mmol/L (ref 98–111)
Creatinine: 0.52 mg/dL — ABNORMAL LOW (ref 0.61–1.24)
GFR, Estimated: >60 mL/min (ref >60)
Glucose, Bld: 177 mg/dL — ABNORMAL HIGH (ref 70–99)
Potassium: 3.5 mmol/L (ref 3.5–5.1)
Sodium: 135 mmol/L (ref 135–145)
Total Bilirubin: 0.5 mg/dL (ref <1.2)
Total Protein: 6.6 g/dL (ref 6.5–8.1)

## 2023-05-28 LAB — PROTEIN, URINE, RANDOM: Total Protein, Urine: 18 mg/dL

## 2023-05-28 MED ORDER — NYSTATIN 100000 UNIT/ML MT SUSP: 5.0000 mL | Freq: Four times a day (QID) | OROMUCOSAL | 2 refills | Status: DC

## 2023-05-28 MED ORDER — HEPARIN SOD (PORK) LOCK FLUSH 100 UNIT/ML IV SOLN
500.0000 [IU] | Freq: Once | INTRAVENOUS | Status: AC
  Administered 2023-05-28: 500 [IU] via INTRAVENOUS
  Filled 2023-05-28: qty 5

## 2023-05-28 MED ORDER — DEXAMETHASONE SODIUM PHOSPHATE 10 MG/ML IJ SOLN
10.0000 mg | Freq: Once | INTRAMUSCULAR | Status: AC
  Administered 2023-05-28: 10 mg via INTRAVENOUS
  Filled 2023-05-28: qty 1

## 2023-05-28 MED ORDER — SODIUM CHLORIDE 0.9 % IV SOLN
Freq: Once | INTRAVENOUS | Status: AC
  Filled 2023-05-28: qty 250

## 2023-05-28 NOTE — Telephone Encounter (Signed)
Referral for [facility] PT/OT/nursing placed in Epic. Message sent to J. Hinton.

## 2023-05-28 NOTE — Telephone Encounter (Signed)
HH referral cancelled. Per pt's daughter, referral for [facility] was sent when he went to ER over the weekend and was approved.

## 2023-05-28 NOTE — Progress Notes (Signed)
Nutrition Follow-up:  Patient with recurrent lung cancer.  Patient receiving carboplatin, taxol, bevacizumab, keytruda.  Held today, just received IV fluids. Recent ED visit with afib.  Met with patient and daughter today.  Patient does not have an appetite and this has been ongoing for several months.  Taking megace.  Drinking 2 ensure a day.  NFPE: Deferred in infusion area, with jacket on and fully clothed.   Medications: megace  Labs: reviewed  Anthropometrics:   Weight 171 lb 14.4 oz today  179 lb on 11/5 183 lb on 10/15 192 lb on 9/3 204 lb on 7/31  11% weight loss in the last 3 months, significant  NUTRITION DIAGNOSIS: Unintentional weight loss continues   MALNUTRITION DIAGNOSIS: Meets criteria for severe malnutrition in context of chronic illness as evidenced by 11% weight loss in the last 3 months and eating less than 75% of estimated energy needs   INTERVENTION:  Patient says that he can drink 3 ensure plus shakes a day Reviewed ways he can add calories and protein in diet.  Handout provided.  Daughter helping provide food for him.     MONITORING, EVALUATION, GOAL: weight trends, intake   NEXT VISIT: Thursday, Jan 2nd during infusion  [place] Audia B. Freida Busman, RD, LDN Registered Dietitian [phone]

## 2023-05-28 NOTE — Assessment & Plan Note (Signed)
Nystatin mouth wash swish and swallow.

## 2023-05-28 NOTE — Progress Notes (Signed)
Hematology/Oncology Progress note [phone] [phone]       Patient Care Team: [name], NP as PCP - General (Family Medicine) [name], RN as Registered Nurse [name], MD as Consulting Physician (Oncology) [name], MD as Referring Physician (Radiation Oncology) Annice Needy, MD as Referring Physician (Vascular Surgery)   REASON FOR VISIT History of  stage IIIA right lung adenocarcinoma, and presumed stage I left lung cancer, now recurrent Stage IV lung adenocarcinoma, follow up for chemotherapy.    ASSESSMENT & PLAN:   Cancer Staging  Primary lung adenocarcinoma, right [facility]) Staging form: Lung, AJCC 8th Edition - Clinical stage from 12/13/2017: Stage IIIA (cT3, cN1, cM0) - Signed by [name], MD on 12/13/2017 - Pathologic stage from 12/24/2022: Stage IV (pTX, pNX, pM1) - Signed by [name], MD on 12/24/2022   Primary lung adenocarcinoma, right (HCC) #History of stage IIIA cT3 N1 M0 lung adenocarcinoma, s/p concurrent chemoradiation [Aug 2019].  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 No actionable, TMB 18.9 m/mb, MSS, PD-L1 <1%  On palliative chemotherapy with carboplatin/Taxol/bevacizumab.   No actionable mutation on NGS. PDL-1<1%, TMB 18.9   Labs are reviewed and discussed with patient. Overall he tolerates chemotherapy. Keytruda added on 03/26/23  Hold carboplatin/Taxol/bevacizumab/Keytruda due to hypotension.  Nov 2024 CT showed partial response. I will de-escalate treatment to Bevacizumab and immunotherapy.  Refer to palliative care service.   Pleural effusion Palliative therapeutic thoracentesis as needed.   Pleural effusion has not responded to chemotherapy.  S/p pleural catheter placement. Drain fluid PRN per family he has about drained every other day  Weight loss Follow up with nutritionist  Recommend nutrition supplements.  Recommend Megace 80mg  BID   Hypotension Due to poor oral intake. IVF 1L NS today. IV Dex 10mg  x1 Recommend patient to follow up with cardiology with his BP meds.    Thrush Nystatin mouth wash swish and swallow.     Orders Placed This Encounter  Procedures   Protein, urine, random    Standing Status:   Future    Standing Expiration Date:   06/12/2024   CBC with Differential (Cancer Center Only)    Standing Status:   Future    Standing Expiration Date:   06/12/2024   CMP (Cancer Center only)    Standing Status:   Future    Standing Expiration Date:   06/12/2024   Ambulatory Referral to Palliative Care    Referral Priority:   Routine    Referral Type:   Consultation    Referral Reason:   Goals of Care    Number of Visits Requested:   1   Follow up  2 weeks  lab MD chemo All questions were answered. The patient knows to call the clinic with any problems, questions or concerns.  [name], MD, PhD [facility] Hematology Oncology 05/28/2023      Oncology History:  Mark Mccarty is a  72 y.o.  male with presents for stage IIIA right lung adenocarcinoma, and presumed stage I left lung cancer now with stage IV recurrent lung adenocarcinoma. Former 43 pack year smoking history.   Oncology History  Primary lung adenocarcinoma, right (HCC)  12/13/2017 Initial Diagnosis  cT3 N1 M0 right upper lobe lung adenocarcinoma  August 2019 Finished  concurrent chemoradiation.   03/17/2019 Finished 1 year of durvalumab treatment.   12/13/2017 Cancer Staging   Staging form: Lung, AJCC 8th Edition - Clinical stage from 12/13/2017: Stage IIIA (cT3, cN1, cM0) - Signed by [name], MD on 12/13/2017   12/31/2017 - 02/19/2018 Chemotherapy   Concurrent  chemotherapy carboplatin and Taxol. With radiation   03/24/2018 - 03/17/2019 Chemotherapy   LUNG DURVALUMAB Q14D x 1 year     12/01/2020 Imaging   PET  1. The 1.1 cm in long axis left apical nodule has maximum SUV of 2.2. Given the progression in size over the last year, the appearance is suspicious for small/low-grade adenocarcinoma.  2. Upper normal sized AP window lymph nodes have maximum SUV just above blood pool, and are nonspecific.   Tumor board discussion 12/29/20  Slow-growing left upper lobe nodule.  CT-guided biopsy and biopsy via bronchoscopy are both of high risk due to underlying emphysema.  Consensus reached upon continue monitoring and repeat CT in 3 months.  If nodule continues to grow, he may benefit from empiric SBRT     03/13/2021 Imaging   CT without contrast showed slightly increase of left upper lobe nodule 12x92mm versus 11 x 8 mm.  Slightly increasing size.  Remains suspicious for bronchogenic neoplasm. Subtle nodularity along the posterior left upper lobe approximately 6 mm is unchanged.  Posttreatment changes distort the right hilum are unchanged.   04/20/2021 - 04/25/2021 Radiation Therapy   Patient received radiation to left upper lobe  nodule for presumed Stage I left upper lobe lung cancer.    07/18/2021 Imaging    CT chest without contrast showed stable posttreatment changes within the right lung.  No findings to suggest recurrent tumor or metastatic disease.  Interval decrease in size of previously characterized irregular nodule in the left upper lobe.    10/18/2021 Imaging    CT chest showed a stable examination of chronic postradiation masslike fibrosis in the right lung. Persistent subsolid nodule in the superior segment of the left lower lobe 1.3 x 1.2 cm.  Nonspecific.  COPD findings, aortic atherosclerosis.  Coronary artery disease.   01/17/2022 Imaging   01/17/2022, CT chest without contrast showed subsolid area in the left lower lobe is similar to prior  imaging however with increasing nodularity.  Recommend PET scan for further evaluation Perihilar parenchymal destruction and scarring from prior radiation in the right hilum extending into the right upper and lower lobe unchanged. Left upper lobe parenchymal scarring likely reflects treatment changes.  Marked pulmonary emphysema in the background.  Aortic atherosclerosis.   02/06/2022 Imaging   PET restaging 1. Focus of increasing nodularity in the subpleural left lower lobe identified as concerning on the recent CT chest shows only minimal FDG uptake on PET imaging today. While this is reassuring and the finding may reflect nfectious/inflammatory etiology, low-grade or well differentiated neoplasm can be poorly FDG avid. Close continued follow-up recommended. 2. Stable perifissural left lower lobe sub solid nodule, also with low level FDG uptake. Continued attention on follow-up recommende    05/17/2022 Imaging   1. Stable perihilar post radiation change and volume loss in the RIGHT hemithorax. 2. Stable mild mediastinal lymphadenopathy. 3. Peripheral nodule in the LEFT lower lobe measures slightly larger but has benign morphology. 4. Stable thickening RIGHT adrenal gland. 5. No new or progressive lung cancer identified   11/16/2022 Imaging   CT chest abdomen pelvis w contrast showed  1. New moderate right pleural effusion with diffuse, nodular,enhancing pleural thickening consistent with pleural metastatic disease. 2. Proximal obstruction of the right upper lobe segmental bronchi with complete atelectasis or consolidation of the right upper lobe, consistent with local malignant recurrence and postobstructive atelectasis of the upper lobe. 3. Interval increase in circumferential soft tissue thickening about the right hilum. Slight interval increase in size in enlarged AP window lymph nodes. 4. Interval enlargement of a subpleural mass of the dependent superior segment left lower lobe consistent  with an enlarging pulmonary metastasis or metachronous primary lung malignancy.5. Emphysema. 6. Coronary artery disease. 7. Prostatomegaly.    11/20/2022 Procedure   Thoracentesis right side Pathology is positive for adenocarcinoma.  Insufficient material for ancillary medical testing.    12/03/2022 Imaging   PET scan showed 1. Hypermetabolic mass at the RIGHT hilum consistent with bronchogenic carcinoma. 2. Postobstructive collapse of the RIGHT upper lobe. 3. Uniform hypermetabolic pleural thickening in the RIGHT lung consistent with pleural metastasis. 4. Large RIGHT effusion. 5. Hypermetabolic nodule in the superior segment of the LEFT lower lobe consistent with pulmonary metastasis. Synchronous bronchogenic carcinoma versus metastatic lesion. 6. Hypermetabolic bilateral mediastinal adenopathy consistent with nodal metastasis. 7. No evidence of metastatic disease outside the thorax. 8. No skeletal metastasis.      12/17/2022 Procedure   Patient underwent biopsy via bronchoscopy for additional tissue Pathology showed  Right upper lobe lavage-positive for non-small cell carcinoma Right upper lobe ENB assisted biopsy showed adenocarcinoma, acinar and solid patterns. Right upper lobe FNA positive for non-small cell carcinoma.  [facility] NGS/PD-L1 showed KEAP1 missense, TP53 missense, SMARCA4 frameshift, PTPRD copy loss, no EGFR, KRAS, BRAF, ALK, ROS1 RET MET ERBB mutations.  TMB 18.9 m/mb, MSS, PD-L1 <1%    12/24/2022 Cancer Staging   Staging form: Lung, AJCC 8th Edition - Pathologic stage from 12/24/2022: Stage IV (pTX, pNX, pM1) - Signed by [name], MD on 12/24/2022 Stage prefix: Initial diagnosis   01/01/2023 -  Chemotherapy   Patient is on Treatment Plan : LUNG NSCLC Carboplatin + Paclitaxel + Bevacizumab q21d     05/10/2023 Imaging   /CT chest abdomen pelvis w contrast showed 1. Reduced size of the large right upper lobe central tumor, currently 1.7 cm in transverse thickness  and previously 2.2 cm. 2. Reduced size right pleural effusion with a right pleural drainage catheter in place. Currently the effusion is small to moderate.Enhancing right pleural metastatic foci. Trace gas along the pleural space at the lung base. 3. Airspace opacity in the superior segment right lower lobe and posteriorly in the right middle lobe, this along was previously atelectatic. Pneumonia not excluded. 4. Subpleural nodularity in the right lower lobe and right middle lobe observed, index lesion 5 mm in diameter. 5. Mild enlargement of a masslike consolidation in the superior segment left lower lobe, currently 3.0 by 2.0 cm, previously 2.7 by 1.9 cm. 6. Persistent hazy density around the right mainstem bronchus,without a discretely measurable lymph node or lesion in this vicinity. 7. Lymph nodes just outside of the pericardium in the mediastinal adipose tissue measure up to 1.4 cm in short axis, previously 0.9 cm. There is some high density nodularity along the right anterior pericardium for example, possibly from pericardial lesion or adjacent lymph node. 8. Stable small pericardial effusion. 9. Emphysema. 10. Chronic avascular necrosis of both femoral heads. 11. Prominent median lobe of the prostate gland indents the bladder base. 12. Scattered colonic diverticula. 13. Aortic atherosclerosis.    05/10/2023 Imaging   CT  chest abdomen pelvis w contrast showed  1. Reduced size of the large right upper lobe central tumor,currently 1.7 cm in transverse thickness and previously 2.2 cm. 2. Reduced size right pleural effusion with a right pleural drainage catheter in place. Currently the effusion is small to moderate.Enhancing right pleural metastatic foci. Trace gas along the pleural space at the lung base. 3. Airspace opacity in the superior segment right lower lobe and posteriorly in the right middle lobe, this along was previously atelectatic. Pneumonia not excluded. 4. Subpleural  nodularity in the right lower lobe and right middle lobe observed, index lesion 5 mm in diameter. 5. Mild enlargement of a masslike consolidation in the superior segment left lower lobe, currently 3.0 by 2.0 cm, previously 2.7 by 1.9 cm. 6. Persistent hazy density around the right mainstem bronchus, without a discretely measurable lymph node or lesion in this vicinity. 7. Lymph nodes just outside of the pericardium in the mediastinal adipose tissue measure up to 1.4 cm in short axis, previously 0.9 cm. There is some high density nodularity along the right anterior pericardium for example, possibly from pericardial lesion or adjacent lymph node. 8. Stable small pericardial effusion. 9. Emphysema. 10. Chronic avascular necrosis of both femoral heads. 11. Prominent median lobe of the prostate gland indents the bladder base. 12. Scattered colonic diverticula. 13. Aortic atherosclerosis.     02/24/2024 Imaging   CT chest w contrast  Right upper lobe collapse with known central right upper lobe/endobronchial mass, poorly visualized.   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 Adjacent right juxta diaphragmatic nodal metastases, improved. Malpositioned right chest port, as above, unchanged. Aortic Atherosclerosis (ICD10-I70.0) and Emphysema (ICD10-J43.9).     Multiple  liver nodules,  07/11/2020 MRI liver with and without contrast showed stable hypervascular lesions in the liver parenchyma without new or progressive findings.  Lesions remain most suggestive of benign etiology.  PET scan did not show evaluation activities.  proteous mirabilis UTI, s/p a course bactrim, improved symptoms   INTERVAL HISTORY Mark Mccarty is a 72 y.o. male who has above history reviewed by me presents for follow-up of recurrent stage IV non-small cell lung cancer.  + SOB, better since pleurx placement on  03/21/23. He gets pleurx drainage every other day. No nausea vomiting diarrhea.  Appetite is not good, weight is stable. CT was not done. No showed.  + mid chest pain with cough.  + weight loss, he has poor oral intake.    . Review of Systems  Constitutional:  Positive for appetite change, fatigue and unexpected weight change. Negative for chills and fever.  [name]:   Negative for hearing loss and voice change.   Eyes:  Negative for eye problems and icterus.  Respiratory:  Positive for shortness of breath. Negative for chest tightness and cough.   Cardiovascular:  Negative for leg swelling.  Gastrointestinal:  Negative for abdominal distention and abdominal pain.  Endocrine: Negative for hot flashes.  Genitourinary:  Negative for difficulty urinating, dysuria and frequency.   Musculoskeletal:  Negative for arthralgias.  Skin:  Negative for itching and rash.  Neurological:  Negative for headaches, light-headedness and numbness.  [name]:  Negative for adenopathy. Does not bruise/bleed easily.  Psychiatric/Behavioral:  Negative for confusion.      MEDICAL HISTORY:  Past Medical History:  Diagnosis Date   Abnormal nuclear stress test    Anxiety    a.) on BZO (lorazepam) PRN  Asthma    Avascular necrosis of femoral head (HCC)    BPH (benign prostatic hyperplasia)    Cataract    a.) s/p extraction on LEFT   Colon adenomas    COPD (chronic obstructive pulmonary disease) (HCC)    Coronary artery disease 11/11/2014   a.) MPI 11/11/2014: EF 56%, mild inf wall ischemia; b.) LHC 12/16/2014: 20% mRCA - med mgmt   DDD (degenerative disc disease), lumbar    Diverticulosis    DM type 2 (diabetes mellitus, type 2) (HCC)    Duodenitis    Dysphagia    Dyspnea    ED (erectile dysfunction)    a.) on PDE5i (sildenafil) PRN   Elevated PSA    Former smoker    Gastritis    GERD (gastroesophageal reflux disease)    [name]    Hilar mass    History of hiatal hernia     Hyperlipidemia    Hypertension    Hyperuricemia    Hypokalemia    Left ventricular diastolic dysfunction 10/04/2016   a.) TTE 10/04/2016: EF 50%, mild MR/TR/PR, mod AR, G1DD; b.) TTE 10/10/2017: EF 45%, triv PR, mild AR/MR/TR, G1DD; c.) TTE 03/30/2020: EF 50%, mild-mod AR, mild MR/TR/PR; d.) TTE 04/23/2022: EF >55%, mild LVH, mild LAE, triv TR, mild PR, mod AR/MR   Liver lesion    Obesity    Pedal edema    Peripheral polyneuropathy    Peyronie's disease 05/2022   Pleural effusion    Primary lung adenocarcinoma, right (HCC)    a.) stage IIIA (cT3, cN1, cM0) --> s/p concurrent chemoradiation 2019 --> carboplatin + pacilitaxel (12/31/2017 - 02/19/2018) followed by 1 year (03/24/2018 - 03/17/2019) adjuvant anti-PDL-1 mAB immunotherapy (durvalumab)   [name] (peptic ulcer disease)    Renal insufficiency    Umbilical hernia    [name]     SURGICAL HISTORY: Past Surgical History:  Procedure Laterality Date   CARDIAC CATHETERIZATION N/A 12/16/2014   Procedure: Left Heart Cath;  Surgeon: Marcina Millard, MD;  Location: [facility] IN[facility]IVE [facility];  Service: Cardiovascular;  Laterality: N/A;   CARDIOVERSION N/A 01/17/2023   Procedure: CARDIOVERSION;  Surgeon: Clotilde Dieter, DO;  Location: [name];  Service: Cardiovascular;  Laterality: N/A;   CATARACT EXTRACTION Left    COLONOSCOPY     COLONOSCOPY WITH PROPOFOL N/A 01/14/2020   Procedure: COLONOSCOPY WITH PROPOFOL;  Surgeon: Toledo, Boykin Nearing, MD;  Location: [facility] ENDOSCOPY;  Service: Gastroenterology;  Laterality: N/A;   CYST REMOVAL TRUNK     chest and back over time and it was removed   CYSTECTOMY     ENDOBRONCHIAL ULTRASOUND N/A 12/09/2017   Procedure: ENDOBRONCHIAL ULTRASOUND;  Surgeon: Shane Crutch, MD;  Location: [name];  Service: Pulmonary;  Laterality: N/A;   IR GUIDED DRAIN W CATHETER PLACEMENT  03/21/2023   PORTA CATH INSERTION N/A 12/23/2017   Procedure: PORTA CATH INSERTION;  Surgeon: Annice Needy, MD;   Location: [facility];  Service: Cardiovascular;  Laterality: N/A;   PORTA CATH INSERTION N/A 01/08/2023   Procedure: PORTA CATH INSERTION;  Surgeon: Annice Needy, MD;  Location: [facility];  Service: Cardiovascular;  Laterality: N/A;   PORTA CATH REMOVAL N/A 06/04/2022   Procedure: PORTA CATH REMOVAL;  Surgeon: Annice Needy, MD;  Location: [facility];  Service: Cardiovascular;  Laterality: N/A;   TEE WITHOUT CARDIOVERSION N/A 01/17/2023   Procedure: TRANSESOPHAGEAL ECHOCARDIOGRAM (TEE);  Surgeon: Clotilde Dieter, DO;  Location: [name];  Service: Cardiovascular;  Laterality: N/A;   UPPER GI ENDOSCOPY     VIDEO BRONCHOSCOPY WITH ENDOBRONCHIAL ULTRASOUND N/A 12/17/2022   Procedure: VIDEO BRONCHOSCOPY WITH ENDOBRONCHIAL ULTRASOUND;  Surgeon: Vida Rigger, MD;  Location: [name];  Service: Thoracic;  Laterality: N/A;    SOCIAL HISTORY: Social History   Socioeconomic History   Marital status: Divorced    Spouse name: Not on file   Number of children: Not on file   Years of education: Not on file   Highest education level: Not on file  Occupational History   Not on file  Tobacco Use   Smoking status: Former    Current packs/day: 0.00    Average packs/day: 1.5 packs/day for 35.0 years (52.5 ttl pk-yrs)    Types: Cigarettes    Start date: 06/21/1980    Quit date: 06/22/2015    Years since quitting: 7.9    Passive exposure: Past   Smokeless tobacco: Never  Vaping Use   Vaping status: Never Used  Substance and Sexual Activity   Alcohol use: Not Currently    Comment: weekends   Drug use: No   Sexual activity: Not Currently  Other Topics Concern   Not on file  Social History Narrative   Lives with son [name].   Social Determinants of Health   Financial Resource Strain: Not on file  Food Insecurity: Not on file  Transportation Needs: Not on file  Physical Activity: Not on file  Stress: Not on file  Social Connections: Unknown (11/14/2021)    Received from [facility]   Social Network    Social Network: Not on file  Intimate Partner Violence: Unknown (10/05/2021)   Received from [facility]   HITS    Physically Hurt: Not on file    Insult or Talk Down To: Not on file    Threaten Physical Harm: Not on file    Scream or Curse: Not on file    FAMILY HISTORY: Family History  Problem Relation Age of Onset   Hypertension Mother    Breast cancer Mother    Heart attack Mother    Lung cancer Father    Heart disease Sister    Diabetes Sister    Colon cancer Sister     ALLERGIES:  is allergic to penicillins.  MEDICATIONS:  Current Outpatient Medications  Medication Sig Dispense Refill   acetaminophen (TYLENOL) 500 MG tablet Take 500 mg by mouth as needed.     albuterol (VENTOLIN HFA) 108 (90 Base) MCG/ACT inhaler Inhale 2 puffs into the lungs every 4 (four) hours as needed.     amiodarone (PACERONE) 200 MG tablet One tablet twice a day for two weeks then one tablet daily after that 54 tablet 0   amLODipine (NORVASC) 10 MG tablet Take 1 tablet (10 mg total) by mouth daily. 30 tablet 0   apixaban (ELIQUIS) 5 MG TABS tablet Take 5 mg by mouth 2 (two) times daily.     ascorbic acid (VITAMIN C) 1000 MG tablet Take 1,000 mg by mouth daily.     aspirin 81 MG tablet Take 81 mg by mouth daily.     cetirizine (ZYRTEC) 10 MG tablet Take 1 tablet by mouth as needed.     cholecalciferol (VITAMIN D3) 25 MCG (1000 UNIT) tablet Take 1,000 Units by mouth daily.     fluticasone (FLONASE) 50 MCG/ACT nasal spray Place 1 spray into both nostrils daily.     gabapentin (NEURONTIN) 300 MG capsule Take 300 mg by mouth  at bedtime as needed.      guaiFENesin-codeine (ROBITUSSIN AC) 100-10 MG/5ML syrup Take 5 mLs by mouth 3 (three) times daily as needed for cough. 473 mL 0   ipratropium-albuterol (DUONEB) 0.5-2.5 (3) MG/3ML SOLN Take 3 mLs by nebulization every 6 (six) hours as needed.     lisinopril (PRINIVIL,ZESTRIL)  40 MG tablet Take 40 mg by mouth every morning.     Magnesium Oxide 400 MG CAPS Take 1 capsule (400 mg total) by mouth daily. 30 capsule 0   megestrol (MEGACE) 40 MG tablet Take 2 tablets (80 mg total) by mouth 2 (two) times daily. 120 tablet 1   metoprolol tartrate (LOPRESSOR) 50 MG tablet Take 50 mg by mouth 2 (two) times daily.     mirabegron ER (MYRBETRIQ) 50 MG TB24 tablet Take 1 tablet (50 mg total) by mouth daily. 30 tablet 11   potassium chloride (KLOR-CON) 10 MEQ tablet Take 1 tablet (10 mEq total) by mouth daily. 90 tablet 1   pravastatin (PRAVACHOL) 20 MG tablet Take 20 mg by mouth at bedtime.     prochlorperazine (COMPAZINE) 10 MG tablet Take 1 tablet (10 mg total) by mouth every 6 (six) hours as needed for nausea or vomiting. 90 tablet 3   senna-docusate (SENOKOT-S) 8.6-50 MG tablet Take 2 tablets by mouth daily. 60 tablet 2   spironolactone (ALDACTONE) 25 MG tablet Take 25 mg by mouth daily.     tamsulosin (FLOMAX) 0.4 MG CAPS capsule Take 0.4 mg by mouth 2 (two) times daily.     nystatin (MYCOSTATIN) 100000 UNIT/ML suspension Take 5 mLs (500,000 Units total) by mouth 4 (four) times daily. Swish and swallow. 473 mL 2   No current facility-administered medications for this visit.   Facility-Administered Medications Ordered in Other Visits  Medication Dose Route Frequency Provider Last Rate Last Admin   sodium chloride flush (NS) 0.9 % injection 10 mL  10 mL Intravenous PRN [name], MD   10 mL at 03/18/18 0858     PHYSICAL EXAMINATION: ECOG PERFORMANCE STATUS: 2 - Symptomatic, <50% confined to bed Vitals:   05/28/23 0838 05/28/23 0848  BP: (!) 85/49 (!) 89/55  Pulse: 91   Resp: 18   Temp: (!) 96.9 F (36.1 C)   SpO2: 97%    Filed Weights   05/28/23 0838  Weight: 171 lb 14.4 oz (78 kg)    Physical Exam Constitutional:      General: He is not in acute distress. [name]:     Head: Normocephalic and atraumatic.  Eyes:     General: No scleral icterus. Cardiovascular:      Rate and Rhythm: Normal rate and regular rhythm.     Heart sounds: Normal heart sounds.  Pulmonary:     Effort: Pulmonary effort is normal. No respiratory distress.     Comments: Decreased breath sound bilaterally Abdominal:     General: Bowel sounds are normal. There is no distension.     Palpations: Abdomen is soft.  Musculoskeletal:        General: No deformity. Normal range of motion.     Cervical back: Normal range of motion and neck supple.  Lymphadenopathy:     Cervical: No cervical adenopathy.  Skin:    General: Skin is warm and dry.     Findings: No rash.     Comments: Right chest wall medi port    Neurological:     Mental Status: He is alert and oriented to person, place, and time. Mental  status is at baseline.     Cranial Nerves: No cranial nerve deficit.      LABORATORY DATA:  I have reviewed the data as listed    Latest Ref Rng & Units 05/28/2023    7:58 AM 05/25/2023    1:32 PM 05/07/2023    8:11 AM  CBC  WBC 4.0 - 10.5 K/uL 9.9  10.3  8.1   Hemoglobin 13.0 - 17.0 g/dL 47.4  25.9  56.3   Hematocrit 39.0 - 52.0 % 31.5  40.0  32.6   Platelets 150 - 400 K/uL 257  306  176       Latest Ref Rng & Units 05/28/2023    7:58 AM 05/26/2023    6:54 AM 05/25/2023    2:23 PM  CMP  Glucose 70 - 99 mg/dL 875  643  329   BUN 8 - 23 mg/dL 10  10  16    Creatinine 0.61 - 1.24 mg/dL 5.18  8.41  6.60   Sodium 135 - 145 mmol/L 135  136  137   Potassium 3.5 - 5.1 mmol/L 3.5  3.4  3.4   Chloride 98 - 111 mmol/L 99  100  99   CO2 22 - 32 mmol/L 26  26  28    Calcium 8.9 - 10.3 mg/dL 8.8  8.6  8.6   Total Protein 6.5 - 8.1 g/dL 6.6     Total Bilirubin <1.2 mg/dL 0.5     Alkaline Phos 38 - 126 U/L 51     AST 15 - 41 U/L 18     ALT 0 - 44 U/L 15        RADIOGRAPHIC STUDIES: I have personally reviewed the radiological images as listed and agreed with the findings in the report.. DG Chest 2 View  Result Date: 05/26/2023 CLINICAL DATA:  CHF. 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 EXAM: CHEST - 2 VIEW COMPARISON:  05/25/23 FINDINGS: There is a right IJ porta catheter with tip in the projection of the proximal SVC. The tunneled pleural drainage catheter is again noted overlying the right lung base. The loculated right pleural effusion appears mildly increased in volume compared with the prior examination. Unchanged right apical opacification. Airspace opacities within the right upper lung is again noted in appears increased from the previous exam. Left lung is clear. Osseous structures appear intact. IMPRESSION: 1. Mild increase in volume of loculated right pleural effusion with tunneled pleural drainage catheter in place. 2. Increased airspace opacities within the right upper lung. 3. Unchanged right apical opacification. Electronically Signed   By: Signa Kell M.D.   On: 05/26/2023 07:59   DG Chest Portable 1 View  Result Date: 05/25/2023 CLINICAL DATA:  Shortness of breath.  Lung cancer. EXAM: PORTABLE CHEST 1 VIEW COMPARISON:  Chest CT 05/08/2023.  Chest x-ray 03/08/2023. FINDINGS: Similar volume loss right hemithorax. Airspace disease noted right upper lung compatible with the consolidative disease seen on chest CT. Asymmetric elevation right hemidiaphragm. Small right pleural effusion with pleural drain identified at the right lung base. Opacity in the right apex compatible with loculated fluid and lung collapse as seen on the previous CT scan. Right Port-A-Cath again noted with tip position probably in the low right innominate vein. Left lung clear. Cardiopericardial silhouette is at upper limits of normal for size. Telemetry leads overlie the chest. IMPRESSION: 1. Similar volume loss right hemithorax with airspace disease right upper lung compatible with the consolidative disease seen on chest  CT. 2. Small right pleural effusion with pleural drain at the right lung base. 3. Opacity in the right apex compatible with loculated  fluid and lung collapse as seen on the previous CT scan. Electronically Signed   By: Kennith Center M.D.   On: 05/25/2023 14:22   CT HEAD WO CONTRAST ( )  Result Date: 05/21/2023 CLINICAL DATA:  Status post fall. Dizziness. On blood thinners. History of lung cancer. EXAM: CT HEAD WITHOUT CONTRAST TECHNIQUE: Contiguous axial images were obtained from the base of the skull through the vertex without intravenous contrast. RADIATION DOSE REDUCTION: This exam was performed according to the departmental dose-optimization program which includes automated exposure control, adjustment of the mA and/or kV according to patient size and/or use of iterative reconstruction technique. COMPARISON:  Head MRI 12/05/2022 FINDINGS: Brain: There is no evidence of an acute infarct, intracranial hemorrhage, mass, midline shift, or extra-axial fluid collection. Slight cerebral atrophy is within normal limits for age. 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 Cerebral white matter hypodensities are nonspecific but compatible with minimal chronic small vessel ischemic disease. Vascular: No hyperdense vessel. Skull: No acute fracture or suspicious osseous lesion. Sinuses/Orbits: Minimal fluid in the left sphenoid sinus. Clear mastoid air cells. Left cataract extraction. Other: None. IMPRESSION: No evidence of acute intracranial abnormality. Electronically Signed   By: Sebastian Ache M.D.   On: 05/21/2023 13:18   CT CHEST ABDOMEN PELVIS W CONTRAST  Result Date: 05/10/2023 CLINICAL DATA:  Right lung cancer restaging * Tracking Code: BO * EXAM: CT CHEST, ABDOMEN, AND PELVIS WITH CONTRAST TECHNIQUE: Multidetector CT imaging of the chest, abdomen and pelvis was performed following the standard protocol during bolus administration of intravenous contrast. RADIATION DOSE REDUCTION: This exam was performed according to the departmental dose-optimization program which includes automated exposure control, adjustment of the mA  and/or kV according to patient size and/or use of iterative reconstruction technique. CONTRAST:  80mL OMNIPAQUE IOHEXOL 300 MG/ML  SOLN COMPARISON:  02/18/2023 (chest) and 11/16/2022 (abdomen/pelvis). FINDINGS: CT CHEST FINDINGS Cardiovascular: The Port-A-Cath catheter appears to loop up into the right internal jugular vein and extends caudad to terminate in the right brachiocephalic vein, similar to prior exams including 02/01/2023. Atheromatous vascular calcification of the thoracic aorta. Stable small pericardial effusion. Mediastinum/Nodes: Persistent hazy density around the right mainstem bronchus, without a discretely measurable lymph node or lesion in this vicinity. Lymph nodes just outside of the pericardium in the mediastinal adipose tissue measure up to 1.4 cm in short axis on image 47 series 2, previously 0.9 cm. There is some high density nodularity along the right anterior pericardium for example on image 46 series 2, possibly from pericardial lesion or adjacent lymph node. Previously hypermetabolic AP window lymph node 0.6 cm in short axis on image 28 series 2, formerly 0.8 cm on 02/18/2023. Lungs/Pleura: Collapsed and partially consolidated right upper lobe, hypodense medial portion measures about 1.7 cm in thickness on image 25 series 2, formerly about 2.2 cm in thickness. This hypodense portion the may represent original tumor but is indistinctly marginated against the atelectatic lung. The right upper lobe bronchus is truncated. Reduced size right pleural effusion with a right pleural drainage catheter in place. Currently the effusion is small to moderate. Notable enhancement along the parietal pleura. Trace gas along the pleural space at the lung base. Emphysema. Previously hypermetabolic posterior consolidation in the superior segment right lower lobe and posteriorly in the right middle lobe, this along was previously atelectatic. Current appearance could be from  pneumonia or aspiration  pneumonitis, versus prior radiation therapy. Scattered pleural nodularity anteriorly along the right middle lobe, small pleural deposits of tumor not excluded. Subpleural nodularity in the right lower lobe observed, index lesion 5 mm in diameter on image 117 series 4. Stable left apical pleuroparenchymal scarring. A 3.0 by 2.0 cm masslike consolidation in the superior segment left lower lobe previously measured 2.7 by 1.9 cm by my measurements. Accordingly this is mildly enlarged. Musculoskeletal: Lower thoracic spondylosis. Probable sebaceous cyst or similar benign lesion along the junction of the right posterior neck and chest, 1.2 cm in short axis on image 1 series 2, previously not hypermetabolic. 0.9 cm sclerotic left eleventh rib lesion medially on image 57 series 2, not previously hypermetabolic. Probably a bone island. CT ABDOMEN PELVIS FINDINGS Hepatobiliary: Contracted gallbladder.  Otherwise unremarkable. Pancreas: Unremarkable Spleen: Unremarkable Adrenals/Urinary Tract: Fullness of both adrenal glands without a discrete mass. The kidneys appear unremarkable. Stomach/Bowel: Scattered colonic diverticula. Vascular/Lymphatic: Small right gastric node 0.7 cm in short axis on image 65 series 2 Atherosclerosis is present, including aortoiliac atherosclerotic disease. Reproductive: Prominent median lobe of the prostate gland indents the bladder base. Other: Unremarkable Musculoskeletal: Left-sided lipoma interposed between the gluteus maximus and the greater trochanter. Hemangioma eccentric to the left in the L2 vertebral body, image 78 series 2. Probable bone island in the right iliac bone. Chronic avascular necrosis of both femoral heads. Degenerative endplate sclerosis inferiorly along the L3 endplate with a sclerotic lesion eccentric to the left in the L3 vertebral body which is nonspecific but not previously hypermetabolic. Probable superficial sebaceous cyst along the right upper thigh/groin region, 1.2  cm in short axis on image 132 series 2, present and not previously hypermetabolic PET-CT 12/03/2022. IMPRESSION: 1. Reduced size of the large right upper lobe central tumor, currently 1.7 cm in transverse thickness and previously 2.2 cm. 2. Reduced size right pleural effusion with a right pleural drainage catheter in place. Currently the effusion is small to moderate. Enhancing right pleural metastatic foci. Trace gas along the pleural space at the lung base. 3. Airspace opacity in the superior segment right lower lobe and posteriorly in the right middle lobe, this along was previously atelectatic. Pneumonia not excluded. 4. Subpleural nodularity in the right lower lobe and right middle lobe observed, index lesion 5 mm in diameter. 5. Mild enlargement of a masslike consolidation in the superior segment left lower lobe, currently 3.0 by 2.0 cm, previously 2.7 by 1.9 cm. 6. Persistent hazy density around the right mainstem bronchus, without a discretely measurable lymph node or lesion in this vicinity. 7. Lymph nodes just outside of the pericardium in the mediastinal adipose tissue measure up to 1.4 cm in short axis, previously 0.9 cm. There is some high density nodularity along the right anterior pericardium for example, possibly from pericardial lesion or adjacent lymph node. 8. Stable small pericardial effusion. 9. Emphysema. 10. Chronic avascular necrosis of both femoral heads. 11. Prominent median lobe of the prostate gland indents the bladder base. 12. Scattered colonic diverticula. 13. Aortic atherosclerosis. Aortic Atherosclerosis (ICD10-I70.0) and Emphysema (ICD10-J43.9). Electronically Signed   By: Gaylyn Rong M.D.   On: 05/10/2023 08:47   IR Guided Horace Porteous W Catheter Placement  Result Date: 03/21/2023 INDICATION: 72 year old male with a history of malignancy referred for right-sided pleural PleurX EXAM: IMAGE GUIDED TUNNELED PLEURAL DRAINAGE CATHETER MEDICATIONS: 1 g vancomycin the antibiotics were  administered within an appropriate time frame prior to the initiation of the procedure. ANESTHESIA/SEDATION: Moderate (conscious) sedation was  employed during this procedure. A total of Versed 1.0 mg and Fentanyl 50 mcg was administered intravenously by the radiology nurse. Total intra-service moderate Sedation Time: 25 minutes. The patient's level of consciousness and vital signs were monitored continuously by radiology nursing throughout the procedure under my direct supervision. COMPLICATIONS: None PROCEDURE: The procedure, risks, benefits, and alternatives were explained to the patient and the patient's family. Specific risks that were addressed included bleeding, infection, pneumothorax, need for further procedure, chance of delayed pneumothorax or hemorrhage, hemoptysis, cardiopulmonary collapse, death. Questions regarding the procedure were encouraged and answered. The patient understands and consents to the procedure. The right chest wall was prepped with Betadine in a sterile fashion, and a sterile drape was applied covering the operative field. A sterile gown and sterile gloves were used for the procedure. Local anesthesia was provided with 1% Lidocaine. Ultrasound image documentation was performed. After creating a small skin incision, a 19 gauge needle was advanced into the pleural cavity under ultrasound guidance. A guide wire was then advanced under fluoroscopy into the pleural space. Pleural access was dilated serially and a 16-French peel-away sheath placed. The skin and subcutaneous tissues were generously infiltrated with 1% lidocaine from the puncture site over the pleura along the intercostal margin anteriorly. A small stab incision was made with 11 blade scalpel at the insertion site of the catheter, and the catheter was back tunneled to the site at the pleural puncture. A tunneled care fusion catheter was placed. This was tunneled from the incision 5 cm anterior to the pleural access to the  access site. The catheter was advanced through the peel-away sheath. The sheath was then removed. Final catheter positioning was confirmed with a fluoroscopic spot image. The access incision was closed with Dermabond. Dermabond was applied to the catheterization incision. Large volume thoracentesis was performed through the new catheter utilizing gravity drainage bag. The patient tolerated the procedure well and remained hemodynamically stable throughout. No complications were encountered and no significant blood loss was encountered. IMPRESSION: Status post image guided right pleural tunneled drainage catheter. Signed, Yvone Neu. Miachel Roux, RPVI [facility] Electronically Signed   By: Gilmer Mor D.O.   On: 03/21/2023 14:39   US THORACENTESIS ASP PLEURAL SPACE W/IMG GUIDE  Result Date: 03/08/2023 INDICATION: Recurrent right pleural effusion. Request received for therapeutic thoracentesis EXAM: ULTRASOUND GUIDED RIGHT THORACENTESIS MEDICATIONS: 7 cc 1% lidocaine COMPLICATIONS: None immediate. PROCEDURE: An ultrasound guided thoracentesis was thoroughly discussed with the patient and questions answered. The benefits, risks, alternatives and complications were also discussed. The patient understands and wishes to proceed with the procedure. Written consent was obtained. Ultrasound was performed to localize and mark an adequate pocket of fluid in the right chest. The area was then prepped and draped in the normal sterile fashion. 1% Lidocaine was used for local anesthesia. Under ultrasound guidance a 6 Fr Safe-T-Centesis catheter was introduced. Thoracentesis was performed. The catheter was removed and a dressing applied. FINDINGS: A total of approximately 1.95 L of serosanguineous fluid was removed. Ordering provider did not request laboratory samples. IMPRESSION: Successful ultrasound guided right thoracentesis yielding 1.95 L of pleural fluid.  Follow-up chest x-ray revealed no evidence of pneumothorax. Procedure performed by Mina Marble, PA-C Electronically Signed   By: Olive Bass M.D.   On: 03/08/2023 15:17   DG Chest Port 1 View  Result Date: 03/08/2023 CLINICAL DATA:  post right thoracentesis EXAM: PORTABLE CHEST 1 VIEW COMPARISON:  Multiple priors FINDINGS: Stable appearance of  the cardiomediastinal silhouette with chronic atelectasis of the right upper lobe. Interval improvement in right effusion with residual small to moderate fluid. No pneumothorax. Left lung remains clear. Similar malpositioning of the right-sided chest port with redundant catheter in the right neck and tip of the catheter terminating in the proximal SVC. IMPRESSION: 1. No findings of pneumothorax following right-sided thoracentesis, with small to moderate residual effusion remaining. 2. Similar malpositioning of the right-sided port with redundant catheter in the right neck and tip of the catheter terminating in the proximal SVC. 3. Other chronic findings as detailed. Electronically Signed   By: Olive Bass M.D.   On: 03/08/2023 15:12

## 2023-05-28 NOTE — Assessment & Plan Note (Addendum)
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 No actionable, TMB 18.9 m/mb, MSS, PD-L1 <1%  On palliative chemotherapy with carboplatin/Taxol/bevacizumab.   No actionable mutation on NGS. PDL-1<1%, TMB 18.9   Labs are reviewed and discussed with patient. Overall he tolerates chemotherapy. Keytruda added on 03/26/23  Hold carboplatin/Taxol/bevacizumab/Keytruda due to hypotension.  Nov 2024 CT showed partial response. I will de-escalate treatment to Bevacizumab and immunotherapy.  Refer to palliative care service.

## 2023-05-28 NOTE — Assessment & Plan Note (Signed)
Due to poor oral intake. IVF 1L NS today. IV Dex 10mg  x1 Recommend patient to follow up with cardiology with his BP meds.

## 2023-05-28 NOTE — Assessment & Plan Note (Signed)
Follow up with nutritionist  Recommend nutrition supplements.  Recommend Megace 80mg  BID

## 2023-05-28 NOTE — Assessment & Plan Note (Signed)
Palliative therapeutic thoracentesis as needed.  Pleural effusion has not responded to chemotherapy.  S/p pleural catheter placement. Drain fluid PRN per family he has about drained every other day

## 2023-05-29 ENCOUNTER — Encounter: Payer: Self-pay | Admitting: Oncology

## 2023-05-29 NOTE — Addendum Note (Signed)
Addended by: [name] on: 05/29/2023 10:30 AM   Modules accepted: Orders

## 2023-06-04 ENCOUNTER — Other Ambulatory Visit: Payer: Self-pay

## 2023-06-04 ENCOUNTER — Inpatient Hospital Stay
Admission: EM | Admit: 2023-06-04 | Discharge: 2023-07-03 | DRG: 871 | Disposition: E | Payer: Medicare Other | Attending: Internal Medicine | Admitting: Internal Medicine

## 2023-06-04 ENCOUNTER — Emergency Department: Payer: Medicare Other

## 2023-06-04 DIAGNOSIS — Z66 Do not resuscitate: Secondary | ICD-10-CM | POA: Diagnosis not present

## 2023-06-04 DIAGNOSIS — J189 Pneumonia, unspecified organism: Secondary | ICD-10-CM | POA: Insufficient documentation

## 2023-06-04 DIAGNOSIS — I5032 Chronic diastolic (congestive) heart failure: Secondary | ICD-10-CM | POA: Diagnosis present

## 2023-06-04 DIAGNOSIS — B379 Candidiasis, unspecified: Secondary | ICD-10-CM | POA: Diagnosis present

## 2023-06-04 DIAGNOSIS — J9601 Acute respiratory failure with hypoxia: Secondary | ICD-10-CM | POA: Diagnosis not present

## 2023-06-04 DIAGNOSIS — W19XXXA Unspecified fall, initial encounter: Secondary | ICD-10-CM | POA: Diagnosis present

## 2023-06-04 DIAGNOSIS — Z87891 Personal history of nicotine dependence: Secondary | ICD-10-CM

## 2023-06-04 DIAGNOSIS — I10 Essential (primary) hypertension: Secondary | ICD-10-CM | POA: Diagnosis not present

## 2023-06-04 DIAGNOSIS — I472 Ventricular tachycardia, unspecified: Secondary | ICD-10-CM | POA: Diagnosis not present

## 2023-06-04 DIAGNOSIS — I700 Atherosclerosis of aorta: Secondary | ICD-10-CM | POA: Diagnosis present

## 2023-06-04 DIAGNOSIS — E46 Unspecified protein-calorie malnutrition: Secondary | ICD-10-CM | POA: Diagnosis present

## 2023-06-04 DIAGNOSIS — J441 Chronic obstructive pulmonary disease with (acute) exacerbation: Secondary | ICD-10-CM

## 2023-06-04 DIAGNOSIS — Z7901 Long term (current) use of anticoagulants: Secondary | ICD-10-CM

## 2023-06-04 DIAGNOSIS — Z800 Family history of malignant neoplasm of digestive organs: Secondary | ICD-10-CM

## 2023-06-04 DIAGNOSIS — I4891 Unspecified atrial fibrillation: Secondary | ICD-10-CM | POA: Diagnosis present

## 2023-06-04 DIAGNOSIS — C3491 Malignant neoplasm of unspecified part of right bronchus or lung: Secondary | ICD-10-CM | POA: Diagnosis present

## 2023-06-04 DIAGNOSIS — J910 Malignant pleural effusion: Secondary | ICD-10-CM | POA: Diagnosis present

## 2023-06-04 DIAGNOSIS — J159 Unspecified bacterial pneumonia: Secondary | ICD-10-CM | POA: Diagnosis present

## 2023-06-04 DIAGNOSIS — Z7189 Other specified counseling: Secondary | ICD-10-CM

## 2023-06-04 DIAGNOSIS — Z880 Allergy status to penicillin: Secondary | ICD-10-CM

## 2023-06-04 DIAGNOSIS — J440 Chronic obstructive pulmonary disease with acute lower respiratory infection: Secondary | ICD-10-CM | POA: Diagnosis present

## 2023-06-04 DIAGNOSIS — Z7982 Long term (current) use of aspirin: Secondary | ICD-10-CM

## 2023-06-04 DIAGNOSIS — I4892 Unspecified atrial flutter: Secondary | ICD-10-CM | POA: Diagnosis present

## 2023-06-04 DIAGNOSIS — Z79899 Other long term (current) drug therapy: Secondary | ICD-10-CM

## 2023-06-04 DIAGNOSIS — A419 Sepsis, unspecified organism: Principal | ICD-10-CM

## 2023-06-04 DIAGNOSIS — Z801 Family history of malignant neoplasm of trachea, bronchus and lung: Secondary | ICD-10-CM

## 2023-06-04 DIAGNOSIS — Z923 Personal history of irradiation: Secondary | ICD-10-CM

## 2023-06-04 DIAGNOSIS — Z803 Family history of malignant neoplasm of breast: Secondary | ICD-10-CM

## 2023-06-04 DIAGNOSIS — E669 Obesity, unspecified: Secondary | ICD-10-CM | POA: Diagnosis present

## 2023-06-04 DIAGNOSIS — R110 Nausea: Secondary | ICD-10-CM | POA: Diagnosis present

## 2023-06-04 DIAGNOSIS — E1169 Type 2 diabetes mellitus with other specified complication: Secondary | ICD-10-CM | POA: Diagnosis present

## 2023-06-04 DIAGNOSIS — Z9221 Personal history of antineoplastic chemotherapy: Secondary | ICD-10-CM

## 2023-06-04 DIAGNOSIS — J432 Centrilobular emphysema: Secondary | ICD-10-CM | POA: Diagnosis present

## 2023-06-04 DIAGNOSIS — I110 Hypertensive heart disease with heart failure: Secondary | ICD-10-CM | POA: Diagnosis present

## 2023-06-04 DIAGNOSIS — R590 Localized enlarged lymph nodes: Secondary | ICD-10-CM | POA: Diagnosis present

## 2023-06-04 DIAGNOSIS — Z860101 Personal history of adenomatous and serrated colon polyps: Secondary | ICD-10-CM

## 2023-06-04 DIAGNOSIS — I2510 Atherosclerotic heart disease of native coronary artery without angina pectoris: Secondary | ICD-10-CM | POA: Diagnosis present

## 2023-06-04 DIAGNOSIS — Z833 Family history of diabetes mellitus: Secondary | ICD-10-CM

## 2023-06-04 DIAGNOSIS — Z6823 Body mass index (BMI) 23.0-23.9, adult: Secondary | ICD-10-CM

## 2023-06-04 DIAGNOSIS — I959 Hypotension, unspecified: Secondary | ICD-10-CM | POA: Diagnosis not present

## 2023-06-04 DIAGNOSIS — Z515 Encounter for palliative care: Secondary | ICD-10-CM | POA: Diagnosis not present

## 2023-06-04 DIAGNOSIS — J9621 Acute and chronic respiratory failure with hypoxia: Secondary | ICD-10-CM | POA: Diagnosis present

## 2023-06-04 DIAGNOSIS — Z8249 Family history of ischemic heart disease and other diseases of the circulatory system: Secondary | ICD-10-CM

## 2023-06-04 DIAGNOSIS — R6520 Severe sepsis without septic shock: Secondary | ICD-10-CM

## 2023-06-04 DIAGNOSIS — N400 Enlarged prostate without lower urinary tract symptoms: Secondary | ICD-10-CM | POA: Diagnosis present

## 2023-06-04 DIAGNOSIS — E785 Hyperlipidemia, unspecified: Secondary | ICD-10-CM | POA: Diagnosis present

## 2023-06-04 DIAGNOSIS — Z8711 Personal history of peptic ulcer disease: Secondary | ICD-10-CM

## 2023-06-04 DIAGNOSIS — Z9842 Cataract extraction status, left eye: Secondary | ICD-10-CM

## 2023-06-04 DIAGNOSIS — F419 Anxiety disorder, unspecified: Secondary | ICD-10-CM | POA: Diagnosis present

## 2023-06-04 LAB — BRAIN NATRIURETIC PEPTIDE: B Natriuretic Peptide: 205.4 pg/mL — ABNORMAL HIGH (ref 0.0–100.0)

## 2023-06-04 LAB — RESP PANEL BY RT-PCR (RSV, FLU A&B, COVID)  RVPGX2
Influenza A by PCR: NEGATIVE
Influenza B by PCR: NEGATIVE
Resp Syncytial Virus by PCR: NEGATIVE
SARS Coronavirus 2 by RT PCR: NEGATIVE

## 2023-06-04 LAB — COMPREHENSIVE METABOLIC PANEL
ALT: 19 U/L (ref 0–44)
AST: 22 U/L (ref 15–41)
Albumin: 2.2 g/dL — ABNORMAL LOW (ref 3.5–5.0)
Alkaline Phosphatase: 68 U/L (ref 38–126)
Anion gap: 11 (ref 5–15)
BUN: 15 mg/dL (ref 8–23)
CO2: 25 mmol/L (ref 22–32)
Calcium: 9.1 mg/dL (ref 8.9–10.3)
Chloride: 97 mmol/L — ABNORMAL LOW (ref 98–111)
Creatinine, Ser: 0.74 mg/dL (ref 0.61–1.24)
GFR, Estimated: >60 mL/min (ref >60)
Glucose, Bld: 126 mg/dL — ABNORMAL HIGH (ref 70–99)
Potassium: 4 mmol/L (ref 3.5–5.1)
Sodium: 133 mmol/L — ABNORMAL LOW (ref 135–145)
Total Bilirubin: 0.5 mg/dL (ref <1.2)
Total Protein: 6.9 g/dL (ref 6.5–8.1)

## 2023-06-04 LAB — URINALYSIS, W/ REFLEX TO CULTURE (INFECTION SUSPECTED)
Bacteria, UA: NONE SEEN
Bilirubin Urine: NEGATIVE
Glucose, UA: NEGATIVE mg/dL
Hgb urine dipstick: NEGATIVE
Ketones, ur: NEGATIVE mg/dL
Leukocytes,Ua: NEGATIVE
Nitrite: NEGATIVE
Protein, ur: NEGATIVE mg/dL
Specific Gravity, Urine: 1.033 — ABNORMAL HIGH (ref 1.005–1.030)
pH: 5 (ref 5.0–8.0)

## 2023-06-04 LAB — CBC WITH DIFFERENTIAL/PLATELET
Abs Immature Granulocytes: 0.07 10*3/uL (ref 0.00–0.07)
Basophils Absolute: 0 10*3/uL (ref 0.0–0.1)
Basophils Relative: 0 %
Eosinophils Absolute: 0 10*3/uL (ref 0.0–0.5)
Eosinophils Relative: 0 %
HCT: 32.4 % — ABNORMAL LOW (ref 39.0–52.0)
Hemoglobin: 10.6 g/dL — ABNORMAL LOW (ref 13.0–17.0)
Immature Granulocytes: 1 %
Lymphocytes Relative: 2 %
Lymphs Abs: 0.3 10*3/uL — ABNORMAL LOW (ref 0.7–4.0)
MCH: 28.8 pg (ref 26.0–34.0)
MCHC: 32.7 g/dL (ref 30.0–36.0)
MCV: 88 fL (ref 80.0–100.0)
Monocytes Absolute: 1.4 10*3/uL — ABNORMAL HIGH (ref 0.1–1.0)
Monocytes Relative: 10 %
Neutro Abs: 12.4 10*3/uL — ABNORMAL HIGH (ref 1.7–7.7)
Neutrophils Relative %: 87 %
Platelets: 241 10*3/uL (ref 150–400)
RBC: 3.68 MIL/uL — ABNORMAL LOW (ref 4.22–5.81)
RDW: 15.9 % — ABNORMAL HIGH (ref 11.5–15.5)
WBC: 14.2 10*3/uL — ABNORMAL HIGH (ref 4.0–10.5)
nRBC: 0 % (ref 0.0–0.2)

## 2023-06-04 LAB — LACTIC ACID, PLASMA
Lactic Acid, Venous: 1.7 mmol/L (ref 0.5–1.9)
Lactic Acid, Venous: 1.1 mmol/L (ref 0.5–1.9)

## 2023-06-04 LAB — PROTIME-INR
INR: 1.5 — ABNORMAL HIGH (ref 0.8–1.2)
Prothrombin Time: 18.4 s — ABNORMAL HIGH (ref 11.4–15.2)

## 2023-06-04 LAB — TROPONIN I (HIGH SENSITIVITY): Troponin I (High Sensitivity): 53 ng/L — ABNORMAL HIGH (ref <18)

## 2023-06-04 LAB — PROCALCITONIN: Procalcitonin: 2.42 ng/mL

## 2023-06-04 MED ORDER — ASPIRIN 81 MG PO TBEC
81.0000 mg | DELAYED_RELEASE_TABLET | Freq: Every day | ORAL | Status: DC
  Administered 2023-06-05: 81 mg via ORAL
  Filled 2023-06-04: qty 1

## 2023-06-04 MED ORDER — SODIUM CHLORIDE 0.9% FLUSH
3.0000 mL | Freq: Two times a day (BID) | INTRAVENOUS | Status: DC
  Administered 2023-06-04 – 2023-06-05 (×3): 3 mL via INTRAVENOUS

## 2023-06-04 MED ORDER — SODIUM CHLORIDE 0.9 % IV SOLN
500.0000 mg | Freq: Once | INTRAVENOUS | Status: AC
  Administered 2023-06-04: 500 mg via INTRAVENOUS
  Filled 2023-06-04: qty 5

## 2023-06-04 MED ORDER — HYDROCOD POLI-CHLORPHE POLI ER 10-8 MG/5ML PO SUER: 5.0000 mL | Freq: Two times a day (BID) | ORAL | Status: DC | PRN

## 2023-06-04 MED ORDER — METOPROLOL TARTRATE 50 MG PO TABS
50.0000 mg | ORAL_TABLET | Freq: Two times a day (BID) | ORAL | Status: DC
  Administered 2023-06-04 – 2023-06-05 (×3): 50 mg via ORAL
  Filled 2023-06-04: qty 2
  Filled 2023-06-04 (×2): qty 1

## 2023-06-04 MED ORDER — VITAMIN C 500 MG PO TABS
1000.0000 mg | ORAL_TABLET | Freq: Every day | ORAL | Status: DC
  Administered 2023-06-05: 1000 mg via ORAL
  Filled 2023-06-04: qty 2

## 2023-06-04 MED ORDER — MAGNESIUM OXIDE -MG SUPPLEMENT 400 (240 MG) MG PO TABS
400.0000 mg | ORAL_TABLET | Freq: Every day | ORAL | Status: DC
  Administered 2023-06-05: 400 mg via ORAL
  Filled 2023-06-04: qty 1

## 2023-06-04 MED ORDER — INSULIN ASPART 100 UNIT/ML IJ SOLN
0.0000 [IU] | Freq: Three times a day (TID) | INTRAMUSCULAR | Status: DC
  Administered 2023-06-05: 2 [IU] via SUBCUTANEOUS
  Administered 2023-06-05: 1 [IU] via SUBCUTANEOUS
  Filled 2023-06-04 (×2): qty 1

## 2023-06-04 MED ORDER — VITAMIN D 25 MCG (1000 UNIT) PO TABS
1000.0000 [IU] | ORAL_TABLET | Freq: Every day | ORAL | Status: DC
  Administered 2023-06-05: 1000 [IU] via ORAL
  Filled 2023-06-04: qty 1

## 2023-06-04 MED ORDER — MEGESTROL ACETATE 20 MG PO TABS
80.0000 mg | ORAL_TABLET | Freq: Two times a day (BID) | ORAL | Status: DC
  Administered 2023-06-04 – 2023-06-05 (×3): 80 mg via ORAL
  Filled 2023-06-04 (×4): qty 4

## 2023-06-04 MED ORDER — MIRABEGRON ER 50 MG PO TB24
50.0000 mg | ORAL_TABLET | Freq: Every day | ORAL | Status: DC
Start: 2023-06-05 — End: 2023-06-06
  Administered 2023-06-05: 50 mg via ORAL
  Filled 2023-06-04 (×2): qty 1

## 2023-06-04 MED ORDER — PREDNISONE 20 MG PO TABS: 40.0000 mg | ORAL_TABLET | Freq: Every day | ORAL | Status: DC

## 2023-06-04 MED ORDER — VANCOMYCIN HCL IN DEXTROSE 1-5 GM/200ML-% IV SOLN
1000.0000 mg | Freq: Once | INTRAVENOUS | Status: AC
  Administered 2023-06-04: 1000 mg via INTRAVENOUS
  Filled 2023-06-04: qty 200

## 2023-06-04 MED ORDER — APIXABAN 5 MG PO TABS
5.0000 mg | ORAL_TABLET | Freq: Two times a day (BID) | ORAL | Status: DC
  Administered 2023-06-04 – 2023-06-05 (×3): 5 mg via ORAL
  Filled 2023-06-04 (×3): qty 1

## 2023-06-04 MED ORDER — AMIODARONE HCL 200 MG PO TABS
200.0000 mg | ORAL_TABLET | Freq: Two times a day (BID) | ORAL | Status: DC
  Administered 2023-06-04 – 2023-06-05 (×3): 200 mg via ORAL
  Filled 2023-06-04 (×3): qty 1

## 2023-06-04 MED ORDER — SODIUM CHLORIDE 0.9 % IV BOLUS (SEPSIS)
1000.0000 mL | Freq: Once | INTRAVENOUS | Status: AC
Start: 2023-06-04 — End: 2023-06-04
  Administered 2023-06-04: 1000 mL via INTRAVENOUS

## 2023-06-04 MED ORDER — IPRATROPIUM-ALBUTEROL 0.5-2.5 (3) MG/3ML IN SOLN
3.0000 mL | Freq: Four times a day (QID) | RESPIRATORY_TRACT | Status: DC
  Administered 2023-06-04 – 2023-06-06 (×7): 3 mL via RESPIRATORY_TRACT
  Filled 2023-06-04 (×7): qty 3

## 2023-06-04 MED ORDER — ACETAMINOPHEN 325 MG PO TABS: 650.0000 mg | ORAL_TABLET | Freq: Four times a day (QID) | ORAL | Status: DC | PRN

## 2023-06-04 MED ORDER — TAMSULOSIN HCL 0.4 MG PO CAPS
0.4000 mg | ORAL_CAPSULE | Freq: Two times a day (BID) | ORAL | Status: DC
  Administered 2023-06-04 – 2023-06-05 (×3): 0.4 mg via ORAL
  Filled 2023-06-04 (×3): qty 1

## 2023-06-04 MED ORDER — SODIUM CHLORIDE 0.9 % IV BOLUS (SEPSIS)
500.0000 mL | Freq: Once | INTRAVENOUS | Status: AC
  Administered 2023-06-04: 500 mL via INTRAVENOUS

## 2023-06-04 MED ORDER — ONDANSETRON HCL 4 MG PO TABS: 4.0000 mg | ORAL_TABLET | Freq: Four times a day (QID) | ORAL | Status: DC | PRN

## 2023-06-04 MED ORDER — LACTATED RINGERS IV SOLN: INTRAVENOUS | Status: DC

## 2023-06-04 MED ORDER — ONDANSETRON HCL 4 MG/2ML IJ SOLN: 4.0000 mg | Freq: Four times a day (QID) | INTRAMUSCULAR | Status: DC | PRN

## 2023-06-04 MED ORDER — SODIUM CHLORIDE 0.9% FLUSH
10.0000 mL | Freq: Two times a day (BID) | INTRAVENOUS | Status: DC
  Administered 2023-06-04 – 2023-06-05 (×4): 10 mL via INTRAVENOUS

## 2023-06-04 MED ORDER — PRAVASTATIN SODIUM 20 MG PO TABS
20.0000 mg | ORAL_TABLET | Freq: Every day | ORAL | Status: DC
  Administered 2023-06-04 – 2023-06-05 (×2): 20 mg via ORAL
  Filled 2023-06-04 (×2): qty 1

## 2023-06-04 MED ORDER — METHYLPREDNISOLONE SODIUM SUCC 125 MG IJ SOLR
125.0000 mg | Freq: Once | INTRAMUSCULAR | Status: AC
  Administered 2023-06-04: 125 mg via INTRAVENOUS
  Filled 2023-06-04: qty 2

## 2023-06-04 MED ORDER — IOHEXOL 350 MG/ML SOLN
75.0000 mL | Freq: Once | INTRAVENOUS | Status: AC | PRN
  Administered 2023-06-04: 75 mL via INTRAVENOUS

## 2023-06-04 MED ORDER — ACETAMINOPHEN 650 MG RE SUPP: 650.0000 mg | Freq: Four times a day (QID) | RECTAL | Status: DC | PRN

## 2023-06-04 MED ORDER — NYSTATIN 100000 UNIT/ML MT SUSP
5.0000 mL | Freq: Four times a day (QID) | OROMUCOSAL | Status: DC
  Administered 2023-06-04 – 2023-06-05 (×4): 500000 [IU] via ORAL
  Filled 2023-06-04 (×5): qty 5

## 2023-06-04 MED ORDER — SODIUM CHLORIDE 0.9 % IV SOLN
2.0000 g | Freq: Once | INTRAVENOUS | Status: AC
  Administered 2023-06-04: 2 g via INTRAVENOUS
  Filled 2023-06-04: qty 12.5

## 2023-06-04 MED ORDER — POLYETHYLENE GLYCOL 3350 17 G PO PACK: 17.0000 g | PACK | Freq: Every day | ORAL | Status: DC | PRN

## 2023-06-04 MED ORDER — CEFTRIAXONE SODIUM 1 G IJ SOLR
1.0000 g | INTRAMUSCULAR | Status: DC
  Administered 2023-06-05: 1 g via INTRAVENOUS
  Filled 2023-06-04: qty 10

## 2023-06-04 MED ORDER — SODIUM CHLORIDE 0.9 % IV SOLN
500.0000 mg | INTRAVENOUS | Status: DC
  Administered 2023-06-05: 500 mg via INTRAVENOUS
  Filled 2023-06-04 (×2): qty 5

## 2023-06-04 NOTE — Assessment & Plan Note (Signed)
In the setting of pneumonia, with increased shortness of breath and cough.  - Solu-Medrol 125 mg once followed by prednisone 40 mg daily - DuoNebs every 6 hours

## 2023-06-04 NOTE — Assessment & Plan Note (Signed)
History of atrial fibrillation/flutter with recent cardioversion on 05/25/2023.  Currently telemetry appears in sinus rhythm.  - EKG pending - Continue telemetry monitoring - Continue home regimen

## 2023-06-04 NOTE — Sepsis Progress Note (Signed)
Elink will follow per sepsis protocol  

## 2023-06-04 NOTE — Assessment & Plan Note (Signed)
In the setting of pneumonia and potential COPD exacerbation. BNP is elevated, however no evidence of hypervolemia on examination, nor diffuse interstitial edema on imaging to suggest volume overload.  - Continue supplemental oxygen to maintain oxygen saturation above 88% - Wean as tolerated

## 2023-06-04 NOTE — Assessment & Plan Note (Signed)
Patient states he would like to remain as full code at this time, but understands that his cancer is noncurable and potentially advancing.  He is open to a palliative care consultation.  - Consult palliative care in the a.m.

## 2023-06-04 NOTE — ED Provider Notes (Signed)
[facility] Provider Note    Event Date/Time   First MD Initiated Contact with Patient 06/04/23 1457     (approximate)   History   Weakness   HPI Mark Mccarty is a 72 y.o. male with history of lung adenocarcinoma, actively on chemotherapy, COPD, A-fib on Eliquis, HTN presenting today for weakness.  Patient states worsening weakness over the past several days to where he is now no longer able to ambulate.  Diminished appetite and feeling more short of breath.  Otherwise, denies fever, chest pain, abdominal pain, nausea, vomiting, diarrhea, constipation, leg pain, leg swelling.  Chart review: Patient with recent admission on 05/25/2023 for similar symptoms with weakness.  During that visit he was found to be in atrial flutter with RVR.  Required cardioversion at that time in the ED.     Physical Exam   Triage Vital Signs: ED Triage Vitals  Encounter Vitals Group     BP 06/04/23 1429 106/60     Systolic BP Percentile --      Diastolic BP Percentile --      Pulse Rate 06/04/23 1429 (!) 119     Resp 06/04/23 1429 (!) 28     Temp 06/04/23 1429 (!) 97 F (36.1 C)     Temp Source 06/04/23 1429 Oral     SpO2 06/04/23 1429 (!) 88 %     Weight 06/04/23 1435 171 lb 11.8 oz (77.9 kg)     Height 06/04/23 1435 6' (1.829 m)     Head Circumference --      Peak Flow --      Pain Score 06/04/23 1432 0     Pain Loc --      Pain Education --      Exclude from Growth Chart --     Most recent vital signs: Vitals:   06/04/23 1855 06/04/23 1900  BP: (!) 140/67 134/69  Pulse: (!) 108 (!) 108  Resp: (!) 23   Temp:    SpO2: 91% 91%   Physical Exam: I have reviewed the vital signs and nursing notes. General: Awake, alert, no acute distress.  Chronically ill and fatigued appearing Head:  Atraumatic, normocephalic.   ENT:  EOM intact, PERRL. Oral mucosa is pink and moist with no lesions. Neck: Neck is supple with full range of motion, No meningeal  signs. Cardiovascular:  tachycardia, RR, No murmurs. Peripheral pulses palpable and equal bilaterally. Respiratory:  Symmetrical chest wall expansion.  Crackles and rhonchi present throughout bilateral lung fields.   Musculoskeletal:  No cyanosis or edema. Moving extremities with full ROM Abdomen:  Soft, nontender, nondistended. Neuro:  GCS 15, moving all four extremities, interacting appropriately. Speech clear. Psych:  Calm, appropriate.   Skin:  Warm, dry, no rash.    ED Results / Procedures / Treatments   Labs (all labs ordered are listed, but only abnormal results are displayed) Labs Reviewed  COMPREHENSIVE METABOLIC PANEL - Abnormal; Notable for the following components:      Result Value   Sodium 133 (*)    Chloride 97 (*)    Glucose, Bld 126 (*)    Albumin 2.2 (*)    All other components within normal limits  CBC WITH DIFFERENTIAL/PLATELET - Abnormal; Notable for the following components:   WBC 14.2 (*)    RBC 3.68 (*)    Hemoglobin 10.6 (*)    HCT 32.4 (*)    RDW 15.9 (*)    Neutro Abs 12.4 (*)  Lymphs Abs 0.3 (*)    Monocytes Absolute 1.4 (*)    All other components within normal limits  PROTIME-INR - Abnormal; Notable for the following components:   Prothrombin Time 18.4 (*)    INR 1.5 (*)    All other components within normal limits  URINALYSIS, W/ REFLEX TO CULTURE (INFECTION SUSPECTED) - Abnormal; Notable for the following components:   Color, Urine YELLOW (*)    APPearance HAZY (*)    Specific Gravity, Urine 1.033 (*)    All other components within normal limits  BRAIN NATRIURETIC PEPTIDE - Abnormal; Notable for the following components:   B Natriuretic Peptide 205.4 (*)    All other components within normal limits  TROPONIN I (HIGH SENSITIVITY) - Abnormal; Notable for the following components:   Troponin I (High Sensitivity) 53 (*)    All other components within normal limits  RESP PANEL BY RT-PCR (RSV, FLU A&B, COVID)  RVPGX2  CULTURE, BLOOD (ROUTINE  X 2)  CULTURE, BLOOD (ROUTINE X 2)  LACTIC ACID, PLASMA  LACTIC ACID, PLASMA     EKG    RADIOLOGY Independently interpreted chest x-ray and CT chest with new infiltrates and progressive lung cancer along with right-sided effusion   PROCEDURES:  Critical Care performed: Yes, see critical care procedure note(s)  .Critical Care  Performed by: Janith Lima, MD Authorized by: Janith Lima, MD   Critical care provider statement:    Critical care time (minutes):  30   Critical care was necessary to treat or prevent imminent or life-threatening deterioration of the following conditions:  Sepsis   Critical care was time spent personally by me on the following activities:  Development of treatment plan with patient or surrogate, discussions with consultants, evaluation of patient's response to treatment, examination of patient, ordering and review of laboratory studies, ordering and review of radiographic studies, ordering and performing treatments and interventions, pulse oximetry, re-evaluation of patient's condition and review of old charts   I assumed direction of critical care for this patient from another provider in my specialty: no     Care discussed with: admitting provider      MEDICATIONS ORDERED IN ED: Medications  sodium chloride flush (NS) 0.9 % injection 10 mL (10 mLs Intravenous Given 06/04/23 1609)  azithromycin (ZITHROMAX) 500 mg in sodium chloride 0.9 % 250 mL IVPB (has no administration in time range)  sodium chloride 0.9 % bolus 1,000 mL (0 mLs Intravenous Stopped 06/04/23 1609)    And  sodium chloride 0.9 % bolus 1,000 mL (1,000 mLs Intravenous New Bag/Given 06/04/23 1756)    And  sodium chloride 0.9 % bolus 500 mL (0 mLs Intravenous Stopped 06/04/23 1556)  ceFEPIme (MAXIPIME) 2 g in sodium chloride 0.9 % 100 mL IVPB (0 g Intravenous Stopped 06/04/23 1630)  vancomycin (VANCOCIN) IVPB 1000 mg/200 mL premix (1,000 mg Intravenous New Bag/Given 06/04/23 1757)  iohexol  (OMNIPAQUE) 350 MG/ML injection 75 mL (75 mLs Intravenous Contrast Given 06/04/23 1715)     IMPRESSION / MDM / ASSESSMENT AND PLAN / ED COURSE  I reviewed the triage vital signs and the nursing notes.                              Differential diagnosis includes, but is not limited to, pneumonia, viral URI, chemotherapy induced weakness, worsening lung cancer, bacteremia  Patient's presentation is most consistent with acute presentation with potential threat to life or bodily function.  Patient is a 72 year old male with lung cancer on chemotherapy presenting today for worsening weakness and shortness of breath.  Patient was found to be hypoxic in the mid 80s and placed on 2 L.  Also tachycardic with tachypnea meeting sepsis criteria.  Blood cultures and lactic were drawn and patient given 30 cc/kg of fluid along with vancomycin and cefepime for likely suspected pneumonia source.  Laboratory workup notable for leukocytosis, chest x-ray and CTA chest showing concerns for new infiltrates concerning for possible pneumonia.  Also appears to have changes to his known lung cancer concerning for progression contributing to his hypoxia and symptoms today.  Will admit to the hospitalist for ongoing treatment of sepsis and further evaluation of progressive lung cancer.  The patient is on the cardiac monitor to evaluate for evidence of arrhythmia and/or significant heart rate changes.     FINAL CLINICAL IMPRESSION(S) / ED DIAGNOSES   Final diagnoses:  Sepsis, due to unspecified organism, unspecified whether acute organ dysfunction present (HCC)  Bacterial pneumonia  Acute hypoxic respiratory failure (HCC)     Rx / DC Orders   ED Discharge Orders     None        Note:  This document was prepared using Dragon voice recognition software and may include unintentional dictation errors.   Janith Lima, MD 06/04/23 [phone]

## 2023-06-04 NOTE — Consult Note (Signed)
CODE SEPSIS - PHARMACY COMMUNICATION  **Broad Spectrum Antibiotics should be administered within 1 hour of Sepsis diagnosis**  Time Code Sepsis Called/Page Received: 1520  Antibiotics Ordered: Vancomycin & Cefepime   Time of 1st antibiotic administration: 1600  Additional action taken by pharmacy: N/A  If necessary, Name of Provider/Nurse Contacted: N/A  Littie Deeds, PharmD Pharmacy Resident  06/04/2023 3:22 PM

## 2023-06-04 NOTE — Consult Note (Signed)
ED Pharmacy Antibiotic Sign Off An antibiotic consult was received from an ED provider for vancomycin and cefepime per pharmacy dosing. A chart review was completed to assess appropriateness.   The following one time order(s) were placed: Vancomycin 1000 mg IV x 1 and Cefepime 2 gm IV x 1   Further antibiotic and/or antibiotic pharmacy consults should be ordered by the admitting provider if indicated.   Thank you for allowing pharmacy to be a part of this patient's care.   Littie Deeds, PharmD Pharmacy Resident  06/04/2023 3:23 PM

## 2023-06-04 NOTE — Assessment & Plan Note (Addendum)
Patient presenting with tachycardia, tachypnea, elevated WBC with CT imaging concerning for obstructive pneumonia.  - Telemetry monitoring - S/p Vancomycin, Cefepime and Azithromycin - Management of pneumonia as noted below - Blood cultures pending - Procalcitonin pending - S/p 2.5 L bolus - Continue maintenance fluids

## 2023-06-04 NOTE — ED Triage Notes (Signed)
Pt presents to the ED POV from home with son for weakness, chills, and loss of appetite. Pt states that this has been going on for about 8 months, but seems to be worse today. Pt has lung cancer and is currently receiving chemo.

## 2023-06-04 NOTE — H&P (Signed)
History and Physical    Patient: Mark Mccarty [name]:[id number] DOB: 07-22-1950 DOA: 06/04/2023 DOS: the patient was seen and examined on 06/04/2023 PCP: [name], NP  Patient coming from: Home  Chief Complaint:  Chief Complaint  Patient presents with   Weakness   HPI: Mark Mccarty is a 72 y.o. 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  [name] states that for the last 1 week, he has been experiencing gradually worsening productive cough, shortness of breath and generalized weakness.  He endorses nausea but denies any vomiting.  Due to the weakness, he did have a fall yesterday where he hurt his ankle but denies any loss of consciousness or head trauma.  He notes that weakness has made it hard for him to walk.  He endorses poor appetite.  He denies any chest pain or palpitations.  He denies any lower extremity swelling  ED course: On arrival to the ED, patient was normotensive at 101/78 with heart rate of 115.  He was saturating at 91% on room air and subsequently placed on 3 L.  He was afebrile at 97.  Initial workup notable for WBC of 14.2, hemoglobin of 10.6, sodium 133, glucose 126, BNP 205, and troponin of 53.  Lactic acid negative x 2.  COVID-19, influenza and RSV PCR negative.  Urinalysis with increased Pacific gravity.  CTA was obtained that demonstrated increasing airspace opacity in the right upper lung with new discontinuously of the distal right mainstem bronchus and contained extraluminal gas next to consolidated right upper lung, new left hilar adenopathy.  Patient started on azithromycin, cefepime and vancomycin.  TRH contacted for admission.  Review of Systems: As mentioned in the history of present illness.  All other systems reviewed and are negative.  Past Medical History:  Diagnosis Date   Abnormal nuclear stress test    Anxiety    a.) on BZO (lorazepam) PRN   Asthma    Avascular necrosis of femoral head (HCC)    BPH (benign prostatic hyperplasia)    Cataract    a.) s/p extraction on LEFT   Colon adenomas    COPD (chronic obstructive pulmonary disease) (HCC)    Coronary artery disease 11/11/2014   a.) MPI 11/11/2014: EF 56%, mild inf wall ischemia; b.) LHC 12/16/2014: 20% mRCA - med mgmt   DDD (degenerative disc disease), lumbar    Diverticulosis    DM type 2 (diabetes mellitus, type 2) (HCC)    Duodenitis    Dysphagia    Dyspnea    ED (erectile dysfunction)    a.) on PDE5i (sildenafil) PRN   Elevated PSA    Former smoker    Gastritis    GERD (gastroesophageal reflux disease)    [name]    Hilar mass    History of hiatal hernia    Hyperlipidemia    Hypertension    Hyperuricemia    Hypokalemia    Left ventricular diastolic dysfunction 10/04/2016   a.) TTE 10/04/2016: EF 50%, mild MR/TR/PR, mod AR, G1DD; b.) TTE 10/10/2017: EF 45%, triv PR, mild AR/MR/TR, G1DD; c.) TTE 03/30/2020: EF 50%, mild-mod AR, mild MR/TR/PR; d.) TTE 04/23/2022: EF >55%, mild LVH, mild LAE, triv TR, mild PR, mod AR/MR   Liver lesion    Obesity    Pedal edema  Peripheral polyneuropathy    Peyronie's disease 05/2022   Pleural effusion    Primary lung adenocarcinoma, right (HCC)    a.) stage IIIA (cT3, cN1, cM0) --> s/p concurrent chemoradiation 2019 --> carboplatin + pacilitaxel (12/31/2017 - 02/19/2018) followed by 1 year (03/24/2018 - 03/17/2019) adjuvant anti-PDL-1 mAB immunotherapy (durvalumab)   [name] (peptic ulcer disease)    Renal insufficiency    Umbilical hernia    [name]    Past Surgical History:  Procedure Laterality Date   CARDIAC CATHETERIZATION N/A 12/16/2014   Procedure: Left Heart Cath;  Surgeon: Marcina Millard, MD;  Location: [facility]  INVASIVE CV LAB;  Service: Cardiovascular;  Laterality: N/A;   CARDIOVERSION N/A 01/17/2023   Procedure: CARDIOVERSION;  Surgeon: Clotilde Dieter, DO;  Location: [name];  Service: Cardiovascular;  Laterality: N/A;   CATARACT EXTRACTION Left    COLONOSCOPY     COLONOSCOPY WITH PROPOFOL N/A 01/14/2020   Procedure: COLONOSCOPY WITH PROPOFOL;  Surgeon: Toledo, Boykin Nearing, MD;  Location: [facility] ENDOSCOPY;  Service: Gastroenterology;  Laterality: N/A;   CYST REMOVAL TRUNK     chest and back over time and it was removed   CYSTECTOMY     ENDOBRONCHIAL ULTRASOUND N/A 12/09/2017   Procedure: ENDOBRONCHIAL ULTRASOUND;  Surgeon: Shane Crutch, MD;  Location: [name];  Service: Pulmonary;  Laterality: N/A;   IR GUIDED DRAIN W CATHETER PLACEMENT  03/21/2023   PORTA CATH INSERTION N/A 12/23/2017   Procedure: PORTA CATH INSERTION;  Surgeon: Annice Needy, MD;  Location: [facility] INVASIVE CV LAB;  Service: Cardiovascular;  Laterality: N/A;   PORTA CATH INSERTION N/A 01/08/2023   Procedure: PORTA CATH INSERTION;  Surgeon: Annice Needy, MD;  Location: [facility] INVASIVE CV LAB;  Service: Cardiovascular;  Laterality: N/A;   PORTA CATH REMOVAL N/A 06/04/2022   Procedure: PORTA CATH REMOVAL;  Surgeon: Annice Needy, MD;  Location: [facility] INVASIVE CV LAB;  Service: Cardiovascular;  Laterality: N/A;   TEE WITHOUT CARDIOVERSION N/A 01/17/2023   Procedure: TRANSESOPHAGEAL ECHOCARDIOGRAM (TEE);  Surgeon: Clotilde Dieter, DO;  Location: [name];  Service: Cardiovascular;  Laterality: N/A;   UPPER GI ENDOSCOPY     VIDEO BRONCHOSCOPY WITH ENDOBRONCHIAL ULTRASOUND N/A 12/17/2022   Procedure: VIDEO BRONCHOSCOPY WITH ENDOBRONCHIAL ULTRASOUND;  Surgeon: Vida Rigger, MD;  Location: [name];  Service: Thoracic;  Laterality: N/A;   Social History:  reports that he quit smoking about 7 years ago. His smoking use included cigarettes. He started smoking about 42 years ago. He has a 52.5 pack-year smoking history. He has been exposed  to tobacco smoke. He has never used smokeless tobacco. He reports that he does not currently use alcohol. He reports that he does not use drugs.  Allergies  Allergen Reactions   Penicillins Rash    Stomach hurt Has patient had a PCN reaction causing immediate rash, facial/tongue/throat swelling, SOB or lightheadedness with hypotension: yes Has patient had a PCN reaction causing severe rash involving mucus membranes or skin necrosis: no Has patient had a PCN reaction that required hospitalization: no Has patient had a PCN reaction occurring within the last 10 years: yes If all of the above answers are "NO", then may proceed with Cephalosporin use.     Family History  Problem Relation Age of Onset   Hypertension Mother    Breast cancer Mother    Heart attack Mother    Lung cancer Father    Heart disease Sister    Diabetes Sister    Colon cancer Sister  Prior to Admission medications   Medication Sig Start Date End Date Taking? Authorizing Provider  acetaminophen (TYLENOL) 500 MG tablet Take 500 mg by mouth as needed.    [provider]  albuterol (VENTOLIN HFA) 108 (90 Base) MCG/ACT inhaler Inhale 2 puffs into the lungs every 4 (four) hours as needed. 11/08/22   [provider]  amiodarone (PACERONE) 200 MG tablet One tablet twice a day for two weeks then one tablet daily after that 05/26/23   Alford Highland, MD  amLODipine (NORVASC) 10 MG tablet Take 1 tablet (10 mg total) by mouth daily. 05/27/23   Alford Highland, MD  apixaban (ELIQUIS) 5 MG TABS tablet Take 5 mg by mouth 2 (two) times daily. 01/10/23   [provider]  ascorbic acid (VITAMIN C) 1000 MG tablet Take 1,000 mg by mouth daily.    Mickey Farber, MD  aspirin 81 MG tablet Take 81 mg by mouth daily.    [provider]  cetirizine (ZYRTEC) 10 MG tablet Take 1 tablet by mouth as needed. 10/25/22 10/25/23  [provider]  cholecalciferol (VITAMIN D3) 25 MCG (1000 UNIT) tablet  Take 1,000 Units by mouth daily.    [provider]  fluticasone (FLONASE) 50 MCG/ACT nasal spray Place 1 spray into both nostrils daily. 01/21/23   [provider]  gabapentin (NEURONTIN) 300 MG capsule Take 300 mg by mouth at bedtime as needed.     [provider]  guaiFENesin-codeine (ROBITUSSIN AC) 100-10 MG/5ML syrup Take 5 mLs by mouth 3 (three) times daily as needed for cough. 05/07/23   [name], MD  ipratropium-albuterol (DUONEB) 0.5-2.5 (3) MG/3ML SOLN Take 3 mLs by nebulization every 6 (six) hours as needed.    [provider]  lisinopril (PRINIVIL,ZESTRIL) 40 MG tablet Take 40 mg by mouth every morning.    [provider]  Magnesium Oxide 400 MG CAPS Take 1 capsule (400 mg total) by mouth daily. 05/26/23   Alford Highland, MD  megestrol (MEGACE) 40 MG tablet Take 2 tablets (80 mg total) by mouth 2 (two) times daily. 04/16/23   [name], MD  metoprolol tartrate (LOPRESSOR) 50 MG tablet Take 50 mg by mouth 2 (two) times daily.    [provider]  mirabegron ER (MYRBETRIQ) 50 MG TB24 tablet Take 1 tablet (50 mg total) by mouth daily. 02/14/23   Sondra Come, MD  nystatin (MYCOSTATIN) 100000 UNIT/ML suspension Take 5 mLs (500,000 Units total) by mouth 4 (four) times daily. Swish and swallow. 05/28/23   [name], MD  potassium chloride (KLOR-CON) 10 MEQ tablet Take 1 tablet (10 mEq total) by mouth daily. 04/16/23   [name], MD  pravastatin (PRAVACHOL) 20 MG tablet Take 20 mg by mouth at bedtime. 09/15/18   [provider]  prochlorperazine (COMPAZINE) 10 MG tablet Take 1 tablet (10 mg total) by mouth every 6 (six) hours as needed for nausea or vomiting. 02/12/23   [name], MD  senna-docusate (SENOKOT-S) 8.6-50 MG tablet Take 2 tablets by mouth daily. 01/09/23   [name], MD  spironolactone (ALDACTONE) 25 MG tablet Take 25 mg by mouth daily. 12/17/22 12/17/23  [provider]  tamsulosin (FLOMAX) 0.4 MG CAPS capsule Take 0.4  mg by mouth 2 (two) times daily. 03/14/17   [provider]    Physical Exam: Vitals:   06/04/23 1700 06/04/23 1730 06/04/23 1855 06/04/23 1900  BP: 125/64 139/72 (!) 140/67 134/69  Pulse: (!) 110 (!) 110 (!) 108 (!) 108  Resp: (!) 22 (!) 27 (!) 23   Temp:      TempSrc:      SpO2: 91% 93% 91% 91%  Weight:      Height:       Physical Exam Vitals and nursing note reviewed.  Constitutional:      Appearance: He is ill-appearing.  [name]:     Head: Normocephalic and atraumatic.     Mouth/Throat:     Mouth: Mucous membranes are dry.  Cardiovascular:     Rate and Rhythm: Regular rhythm. Tachycardia present.     Heart sounds: No murmur heard. Pulmonary:     Effort: Tachypnea and accessory muscle usage present.     Breath sounds: Rhonchi (Right side) present. No decreased breath sounds, wheezing or rales.  Abdominal:     General: There is no distension.     Palpations: Abdomen is soft.     Tenderness: There is no abdominal tenderness. There is no guarding.  Musculoskeletal:     Right lower leg: No edema.     Left lower leg: Tenderness (Throughout) present. No swelling or deformity. No edema.  Skin:    General: Skin is warm and dry.     Findings: No bruising.  Neurological:     Mental Status: He is alert and oriented to person, place, and time. Mental status is at baseline.  Psychiatric:        Mood and Affect: Mood normal.        Behavior: Behavior normal.    Data Reviewed: CBC with WBC of 14.2, hemoglobin of 10.6, and platelets of 241 CMP with sodium of 133, potassium 4.0, bicarb 25, glucose 126, BUN 15, creatinine 0.74, AST 22, ALT 19, and GFR above 60 BNP 205 Troponin 53 Lactic acid 1.7 and then 1.1 INR 1.5 COVID-19, influenza and RSV PCR negative Urinalysis with increased Pacific gravity  DG Ankle Complete Left  Result Date: 06/04/2023 CLINICAL DATA:  Status post fall 2 days ago with left ankle pain and swelling EXAM: LEFT ANKLE COMPLETE - 3 VIEW COMPARISON:   None Available. FINDINGS: There are no findings of fracture or dislocation. No joint effusion. Subtle sclerosis in the distal fibular diaphysis. Ankle mortise is intact. Soft tissues are unremarkable. IMPRESSION: 1. No acute fracture or dislocation. 2. Subtle sclerosis in the distal fibular diaphysis may reflect sequela of remote injury. Electronically Signed   By: Agustin Cree M.D.   On: 06/04/2023 20:35   CT Angio Chest PE W and/or Wo Contrast  Result Date: 06/04/2023 CLINICAL DATA:  Lung carcinoma, shortness of breath EXAM: CT ANGIOGRAPHY CHEST WITH CONTRAST TECHNIQUE: Multidetector CT imaging of the chest was performed using the standard protocol during bolus administration of intravenous contrast. Multiplanar CT image reconstructions and MIPs were obtained to evaluate the vascular anatomy. RADIATION DOSE REDUCTION: This exam was performed according to the departmental dose-optimization program which includes automated exposure control, adjustment of the mA and/or kV according to patient size and/or use of iterative reconstruction technique. CONTRAST:  75mL OMNIPAQUE IOHEXOL 350 MG/ML SOLN COMPARISON:  05/08/2023 FINDINGS: Cardiovascular: Right IJ port catheter loops in the IJ tip in the right brachiocephalic vein as before. SVC patent. Heart size normal. Small pericardial effusion. Satisfactory opacification of pulmonary arteries noted, and there is no evidence of pulmonary emboli. Scattered left coronary calcifications. Adequate contrast opacification of the thoracic aorta with no evidence of dissection, aneurysm, or stenosis. There is scattered plaque in the arch and descending thoracic aorta. Classic 3-vessel brachiocephalic arch anatomy without  proximal stenosis. Mediastinum/Nodes: Right perihilar consolidation extending around the distal right mainstem bronchus with mild narrowing. Anterior pericardial lymph nodes measure up to 1.1 cm short axis diameter, stable. Stable 8 mm AP window node. New 1.1 cm  left hilar adenopathy. New discontinuity of the mainstem bronchus along its distal right aspect just above the carina. Contained extraluminal gas medial to the consolidated right upper lung. Lungs/Pleura: Tunneled right pleural drain catheter at the lung base with relatively stable loculated basilar effusion. Increasing airspace consolidation in apical and posterior segments right upper lung. Pulmonary emphysema. Stable 2.7 cm pleural based consolidation in the superior segment left lower lobe. Upper Abdomen: No adrenal mass.  No acute findings. Musculoskeletal: Vertebral endplate spurring at multiple levels in the lower thoracic spine. Stable sclerosis in the T10 vertebral body. Review of the MIP images confirms the above findings. IMPRESSION: 1. Negative for acute PE or thoracic aortic dissection. 2. Increasing airspace consolidation in the right upper lung with new discontinuity of the distal right mainstem bronchus and contained extraluminal gas medial to the consolidated right upper lung. 3. Stable loculated right pleural effusion with tunneled pleural drain catheter in place. 4. New 1.1 cm left hilar adenopathy. 5. Aortic Atherosclerosis (ICD10-I70.0) and Emphysema (ICD10-J43.9). Electronically Signed   By: Corlis Leak M.D.   On: 06/04/2023 18:44   DG Chest 2 View  Result Date: 06/04/2023 CLINICAL DATA:  Weakness. Chest pain. 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 EXAM: CHEST - 2 VIEW COMPARISON:  Chest radiograph dated May 26, 2023. FINDINGS: Right internal jugular Port-A-Cath with tip projecting over the proximal SVC. Similar position of a tunneled pleural drainage catheter at the right lung base. The loculated right pleural effusion is not significantly changed in volume compared to the prior exam. Similar right apical opacification. Slightly increased airspace opacities in the right upper lung. No focal consolidation in the left lung. Visualized osseous  structures are unchanged. IMPRESSION: 1. Similar loculated right pleural effusion with tunneled pleural drainage catheter at the right lung base. 2. Mildly increased airspace opacities in the right upper lung. 3. Similar right apical opacification. Electronically Signed   By: Hart Robinsons M.D.   On: 06/04/2023 15:48     Results are pending, will review when available.  Assessment and Plan:  * Sepsis [facility]) Patient presenting with tachycardia, tachypnea, elevated WBC with CT imaging concerning for obstructive pneumonia.  - Telemetry monitoring - S/p Vancomycin, Cefepime and Azithromycin - Management of pneumonia as noted below - Blood cultures pending - Procalcitonin pending - S/p 2.5 L bolus - Continue maintenance fluids  Postobstructive pneumonia CT imaging obtained today with increasing airspace consolidation of the right upper lung with new discontinuously of the distal right mainstem bronchus.  Uncertain what the extraluminal gas is.  Per chart review, patient's known stage IV cancer involved in the right upper lobe previously, however opacity in is enlarging, concerning for post obstructive pneumonia.  - S/p vancomycin, cefepime, azithromycin - Continue with Rocephin and azithromycin - Strep pneumo Legionella urinary antigens - Procalcitonin pending - Consider pulmonology consultation in the a.m.  Acute hypoxic respiratory failure (HCC) In the setting of pneumonia and potential COPD exacerbation. BNP is elevated, however no evidence of hypervolemia on examination, nor diffuse interstitial edema on imaging to suggest volume overload.  - Continue supplemental oxygen to maintain oxygen saturation above 88% - Wean as tolerated  COPD with acute exacerbation (HCC) In the setting of pneumonia, with increased shortness of breath and cough.  -  Solu-Medrol 125 mg once followed by prednisone 40 mg daily - DuoNebs every 6 hours  Essential hypertension Previous history of  hypertension, however it seems antihypertensives have been on hold due to hypotension.  - Continue to hold home antihypertensives  Primary lung adenocarcinoma, right [facility]) Patient had an original history of stage IIIa lung cancer followed by stage IV adenocarcinoma, currently on palliative chemotherapy.  I am concerned given changes on CT imaging today, that patient's cancer is advancing.  - Consider oncology consultation in the a.m. - Continue regular Pleurx drainage  Flutter-fibrillation (HCC) History of atrial fibrillation/flutter with recent cardioversion on 05/25/2023.  Currently telemetry appears in sinus rhythm.  - EKG pending - Continue telemetry monitoring - Continue home regimen  Goals of care, counseling/discussion Patient states he would like to remain as full code at this time, but understands that his cancer is noncurable and potentially advancing.  He is open to a palliative care consultation.  - Consult palliative care in the a.m.  Type 2 diabetes mellitus with other specified complication (HCC) - SSI, sensitive - A1c pending  Advance Care Planning:   Code Status: Full Code verified by patient  Consults: None  Family Communication: No family at bedside  Severity of Illness: The appropriate patient status for this patient is INPATIENT. Inpatient status is judged to be reasonable and necessary in order to provide the required intensity of service to ensure the patient's safety. The patient's presenting symptoms, physical exam findings, and initial radiographic and laboratory data in the context of their chronic comorbidities is felt to place them at high risk for further clinical deterioration. Furthermore, it is not anticipated that the patient will be medically stable for discharge from the hospital within 2 midnights of admission.   * I certify that at the point of admission it is my clinical judgment that the patient will require inpatient hospital care spanning  beyond 2 midnights from the point of admission due to high intensity of service, high risk for further deterioration and high frequency of surveillance required.*  Author: Verdene Lennert, MD 06/04/2023 8:52 PM  For on call review [email or web address].

## 2023-06-04 NOTE — Assessment & Plan Note (Addendum)
Hemoglobin A1c borderline at 6.4

## 2023-06-04 NOTE — Assessment & Plan Note (Addendum)
Patient had an original history of stage IIIa lung cancer followed by stage IV adenocarcinoma, currently on palliative chemotherapy.  I am concerned given changes on CT imaging today, that patient's cancer is advancing.  - Consider oncology consultation in the a.m. - Continue regular Pleurx drainage

## 2023-06-04 NOTE — Assessment & Plan Note (Signed)
Previous history of hypertension, however it seems antihypertensives have been on hold due to hypotension.  - Continue to hold home antihypertensives

## 2023-06-04 NOTE — Assessment & Plan Note (Addendum)
CT imaging obtained today with increasing airspace consolidation of the right upper lung with new discontinuously of the distal right mainstem bronchus.  Uncertain what the extraluminal gas is.  Per chart review, patient's known stage IV cancer involved in the right upper lobe previously, however opacity in is enlarging, concerning for post obstructive pneumonia.  - S/p vancomycin, cefepime, azithromycin - Continue with Rocephin and azithromycin - Strep pneumo Legionella urinary antigens - Procalcitonin pending - Consider pulmonology consultation in the a.m.

## 2023-06-05 ENCOUNTER — Other Ambulatory Visit: Payer: Self-pay

## 2023-06-05 ENCOUNTER — Encounter: Payer: Self-pay | Admitting: Oncology

## 2023-06-05 DIAGNOSIS — J441 Chronic obstructive pulmonary disease with (acute) exacerbation: Secondary | ICD-10-CM | POA: Diagnosis not present

## 2023-06-05 DIAGNOSIS — J9601 Acute respiratory failure with hypoxia: Secondary | ICD-10-CM | POA: Diagnosis not present

## 2023-06-05 DIAGNOSIS — A419 Sepsis, unspecified organism: Secondary | ICD-10-CM | POA: Diagnosis not present

## 2023-06-05 DIAGNOSIS — C3491 Malignant neoplasm of unspecified part of right bronchus or lung: Secondary | ICD-10-CM | POA: Diagnosis not present

## 2023-06-05 DIAGNOSIS — Z515 Encounter for palliative care: Secondary | ICD-10-CM | POA: Diagnosis not present

## 2023-06-05 DIAGNOSIS — J159 Unspecified bacterial pneumonia: Secondary | ICD-10-CM

## 2023-06-05 DIAGNOSIS — J189 Pneumonia, unspecified organism: Secondary | ICD-10-CM | POA: Diagnosis not present

## 2023-06-05 LAB — COMPREHENSIVE METABOLIC PANEL
ALT: 19 U/L (ref 0–44)
AST: 18 U/L (ref 15–41)
Albumin: 1.9 g/dL — ABNORMAL LOW (ref 3.5–5.0)
Alkaline Phosphatase: 62 U/L (ref 38–126)
Anion gap: 11 (ref 5–15)
BUN: 11 mg/dL (ref 8–23)
CO2: 24 mmol/L (ref 22–32)
Calcium: 8.4 mg/dL — ABNORMAL LOW (ref 8.9–10.3)
Chloride: 100 mmol/L (ref 98–111)
Creatinine, Ser: 0.48 mg/dL — ABNORMAL LOW (ref 0.61–1.24)
GFR, Estimated: >60 mL/min (ref >60)
Glucose, Bld: 150 mg/dL — ABNORMAL HIGH (ref 70–99)
Potassium: 3.4 mmol/L — ABNORMAL LOW (ref 3.5–5.1)
Sodium: 135 mmol/L (ref 135–145)
Total Bilirubin: 0.6 mg/dL (ref <1.2)
Total Protein: 6.5 g/dL (ref 6.5–8.1)

## 2023-06-05 LAB — HEMOGLOBIN A1C
Hgb A1c MFr Bld: 6 % — ABNORMAL HIGH (ref 4.8–5.6)
Mean Plasma Glucose: 125.5 mg/dL

## 2023-06-05 LAB — CBC
HCT: 29.1 % — ABNORMAL LOW (ref 39.0–52.0)
Hemoglobin: 9.5 g/dL — ABNORMAL LOW (ref 13.0–17.0)
MCH: 29 pg (ref 26.0–34.0)
MCHC: 32.6 g/dL (ref 30.0–36.0)
MCV: 88.7 fL (ref 80.0–100.0)
Platelets: 214 10*3/uL (ref 150–400)
RBC: 3.28 MIL/uL — ABNORMAL LOW (ref 4.22–5.81)
RDW: 15.9 % — ABNORMAL HIGH (ref 11.5–15.5)
WBC: 10.9 10*3/uL — ABNORMAL HIGH (ref 4.0–10.5)
nRBC: 0 % (ref 0.0–0.2)

## 2023-06-05 LAB — CBG MONITORING, ED
Glucose-Capillary: 153 mg/dL — ABNORMAL HIGH (ref 70–99)
Glucose-Capillary: 174 mg/dL — ABNORMAL HIGH (ref 70–99)
Glucose-Capillary: 127 mg/dL — ABNORMAL HIGH (ref 70–99)

## 2023-06-05 LAB — GLUCOSE, CAPILLARY
Glucose-Capillary: 140 mg/dL — ABNORMAL HIGH (ref 70–99)
Glucose-Capillary: 131 mg/dL — ABNORMAL HIGH (ref 70–99)

## 2023-06-05 LAB — MRSA NEXT GEN BY PCR, NASAL: MRSA by PCR Next Gen: NOT DETECTED

## 2023-06-05 LAB — STREP PNEUMONIAE URINARY ANTIGEN: Strep Pneumo Urinary Antigen: NEGATIVE

## 2023-06-05 MED ORDER — CHLORHEXIDINE GLUCONATE CLOTH 2 % EX PADS
6.0000 | MEDICATED_PAD | Freq: Every day | CUTANEOUS | Status: DC
  Administered 2023-06-05: 6 via TOPICAL

## 2023-06-05 MED ORDER — GLUCERNA SHAKE PO LIQD
237.0000 mL | Freq: Three times a day (TID) | ORAL | Status: DC
  Administered 2023-06-05: 237 mL via ORAL

## 2023-06-05 MED ORDER — POTASSIUM CHLORIDE CRYS ER 20 MEQ PO TBCR
20.0000 meq | EXTENDED_RELEASE_TABLET | Freq: Once | ORAL | Status: AC
  Administered 2023-06-05: 20 meq via ORAL
  Filled 2023-06-05: qty 1

## 2023-06-05 NOTE — ED Notes (Signed)
Pt's son and nephew at bedside. This RN updated pt's son on conversation Dr. Ophelia Charter had with pt. And notified son that palliative care would be coming to speak with them.

## 2023-06-05 NOTE — Consult Note (Signed)
Palliative Medicine [facility] at [facility] [phone] [phone]   Name: Mark Mccarty Date: 06/05/2023 MRN: [id number]  DOB: October 12, 1950  Patient Care Team: [name], NP as PCP - General (Family Medicine) [name], RN as Registered Nurse [name], MD as Consulting Physician (Oncology) [name], MD as Referring Physician (Radiation Oncology) Annice Needy, MD as Referring Physician (Vascular Surgery)    REASON FOR CONSULTATION: Mark Mccarty is a 72 y.o. 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  Patient was admitted to the hospital with postobstructive pneumonia.  Palliative care was consulted to address goals.  SOCIAL HISTORY:     reports that he quit smoking about 7 years ago. His smoking use included cigarettes. He started smoking about 42 years ago. He has a 52.5 pack-year smoking history. He has been exposed to tobacco smoke. He has never used smokeless tobacco. He reports that he does not currently use alcohol. He reports that he does not use drugs.  Patient is unmarried.  Lives at home with his son.  ADVANCE DIRECTIVES:  Not on file  CODE STATUS: DNR  PAST MEDICAL HISTORY: Past Medical History:  Diagnosis Date   Abnormal nuclear stress test    Anxiety    a.) on BZO (lorazepam) PRN   Asthma    Avascular necrosis of femoral head (HCC)    BPH (benign prostatic hyperplasia)    Cataract    a.) s/p extraction on LEFT   Colon adenomas    COPD (chronic obstructive pulmonary disease) (HCC)    Coronary artery disease 11/11/2014   a.) MPI 11/11/2014: EF 56%, mild inf wall ischemia; b.) LHC 12/16/2014: 20% mRCA - med mgmt   DDD (degenerative disc disease), lumbar    Diverticulosis    DM type 2 (diabetes mellitus, type 2) (HCC)    Duodenitis    Dysphagia    Dyspnea    ED (erectile dysfunction)    a.) on PDE5i (sildenafil) PRN    Elevated PSA    Former smoker    Gastritis    GERD (gastroesophageal reflux disease)    [name]    Hilar mass    History of hiatal hernia    Hyperlipidemia    Hypertension    Hyperuricemia    Hypokalemia    Left ventricular diastolic dysfunction 10/04/2016   a.) TTE 10/04/2016: EF 50%, mild MR/TR/PR, mod AR, G1DD; b.) TTE 10/10/2017: EF 45%, triv PR, mild AR/MR/TR, G1DD; c.) TTE 03/30/2020: EF 50%, mild-mod AR, mild MR/TR/PR; d.) TTE 04/23/2022: EF >55%, mild LVH, mild LAE, triv TR, mild PR, mod AR/MR   Liver lesion    Obesity    Pedal edema    Peripheral polyneuropathy    Peyronie's disease 05/2022   Pleural effusion    Primary lung adenocarcinoma, right (HCC)    a.) stage IIIA (cT3, cN1, cM0) --> s/p concurrent chemoradiation 2019 --> carboplatin + pacilitaxel (12/31/2017 - 02/19/2018) followed by 1 year (03/24/2018 - 03/17/2019) adjuvant anti-PDL-1 mAB immunotherapy (durvalumab)   [name] (peptic ulcer disease)    Renal insufficiency    Umbilical hernia    [name]     PAST SURGICAL HISTORY:  Past Surgical History:  Procedure Laterality Date   CARDIAC CATHETERIZATION N/A 12/16/2014   Procedure: Left Heart Cath;  Surgeon: Marcina Millard, MD;  Location: [facility] INVASIVE CV [facility];  Service: Cardiovascular;  Laterality: N/A;   CARDIOVERSION N/A  01/17/2023   Procedure: CARDIOVERSION;  Surgeon: Clotilde Dieter, DO;  Location: [name];  Service: Cardiovascular;  Laterality: N/A;   CATARACT EXTRACTION Left    COLONOSCOPY     COLONOSCOPY WITH PROPOFOL N/A 01/14/2020   Procedure: COLONOSCOPY WITH PROPOFOL;  Surgeon: Toledo, Boykin Nearing, MD;  Location: [facility] ENDOSCOPY;  Service: Gastroenterology;  Laterality: N/A;   CYST REMOVAL TRUNK     chest and back over time and it was removed   CYSTECTOMY     ENDOBRONCHIAL ULTRASOUND N/A 12/09/2017   Procedure: ENDOBRONCHIAL ULTRASOUND;  Surgeon: Shane Crutch, MD;  Location: [name];  Service:  Pulmonary;  Laterality: N/A;   IR GUIDED DRAIN W CATHETER PLACEMENT  03/21/2023   PORTA CATH INSERTION N/A 12/23/2017   Procedure: PORTA CATH INSERTION;  Surgeon: Annice Needy, MD;  Location: [facility];  Service: Cardiovascular;  Laterality: N/A;   PORTA CATH INSERTION N/A 01/08/2023   Procedure: PORTA CATH INSERTION;  Surgeon: Annice Needy, MD;  Location: [facility];  Service: Cardiovascular;  Laterality: N/A;   PORTA CATH REMOVAL N/A 06/04/2022   Procedure: PORTA CATH REMOVAL;  Surgeon: Annice Needy, MD;  Location: [facility];  Service: Cardiovascular;  Laterality: N/A;   TEE WITHOUT CARDIOVERSION N/A 01/17/2023   Procedure: TRANSESOPHAGEAL ECHOCARDIOGRAM (TEE);  Surgeon: Clotilde Dieter, DO;  Location: [name];  Service: Cardiovascular;  Laterality: N/A;   UPPER GI ENDOSCOPY     VIDEO BRONCHOSCOPY WITH ENDOBRONCHIAL ULTRASOUND N/A 12/17/2022   Procedure: VIDEO BRONCHOSCOPY WITH ENDOBRONCHIAL ULTRASOUND;  Surgeon: Vida Rigger, MD;  Location: [name];  Service: Thoracic;  Laterality: N/A;    HEMATOLOGY/ONCOLOGY HISTORY:  Oncology History  Primary lung adenocarcinoma, right (HCC)  12/13/2017 Initial Diagnosis   cT3 N1 M0 right upper lobe lung adenocarcinoma  August 2019 Finished  concurrent chemoradiation.   03/17/2019 Finished 1 year of durvalumab treatment.   12/13/2017 Cancer Staging   Staging form: Lung, AJCC 8th Edition - Clinical stage from 12/13/2017: Stage IIIA (cT3, cN1, cM0) - Signed by [name], MD on 12/13/2017   12/31/2017 - 02/19/2018 Chemotherapy   Concurrent chemotherapy carboplatin and Taxol. With radiation   03/24/2018 - 03/17/2019 Chemotherapy   LUNG DURVALUMAB Q14D x 1 year     12/01/2020 Imaging   PET  1. The 1.1 cm in long axis left apical nodule has maximum SUV of 2.2. Given the progression in size over the last year, the appearance is suspicious for small/low-grade adenocarcinoma.  2. Upper normal sized AP window lymph nodes have  maximum SUV just above blood pool, and are nonspecific.   Tumor board discussion 12/29/20  Slow-growing left upper lobe nodule.  CT-guided biopsy and biopsy via bronchoscopy are both of high risk due to underlying emphysema.  Consensus reached upon continue monitoring and repeat CT in 3 months.  If nodule continues to grow, he may benefit from empiric SBRT     03/13/2021 Imaging   CT without contrast showed slightly increase of left upper lobe nodule 12x54mm versus 11 x 8 mm.  Slightly increasing size.  Remains suspicious for bronchogenic neoplasm. Subtle nodularity along the posterior left upper lobe approximately 6 mm is unchanged.  Posttreatment changes distort the right hilum are unchanged.   04/20/2021 - 04/25/2021 Radiation Therapy   Patient received radiation to left upper lobe  nodule for presumed Stage I left upper lobe lung cancer.    07/18/2021 Imaging    CT chest without contrast showed stable posttreatment changes within the right  lung.  No findings to suggest recurrent tumor or metastatic disease.  Interval decrease in size of previously characterized irregular nodule in the left upper lobe.    10/18/2021 Imaging    CT chest showed a stable examination of chronic postradiation masslike fibrosis in the right lung. Persistent subsolid nodule in the superior segment of the left lower lobe 1.3 x 1.2 cm.  Nonspecific.  COPD findings, aortic atherosclerosis.  Coronary artery disease.   01/17/2022 Imaging   01/17/2022, CT chest without contrast showed subsolid area in the left lower lobe is similar to prior imaging however with increasing nodularity.  Recommend PET scan for further evaluation Perihilar parenchymal destruction and scarring from prior radiation in the right hilum extending into the right upper and lower lobe unchanged. Left upper lobe parenchymal scarring likely reflects treatment changes.  Marked pulmonary emphysema in the background.  Aortic atherosclerosis.   02/06/2022  Imaging   PET restaging 1. Focus of increasing nodularity in the subpleural left lower lobe identified as concerning on the recent CT chest shows only minimal FDG uptake on PET imaging today. While this is reassuring and the finding may reflect nfectious/inflammatory etiology, low-grade or well differentiated neoplasm can be poorly FDG avid. Close continued follow-up recommended. 2. Stable perifissural left lower lobe sub solid nodule, also with low level FDG uptake. Continued attention on follow-up recommende    05/17/2022 Imaging   1. Stable perihilar post radiation change and volume loss in the RIGHT hemithorax. 2. Stable mild mediastinal lymphadenopathy. 3. Peripheral nodule in the LEFT lower lobe measures slightly larger but has benign morphology. 4. Stable thickening RIGHT adrenal gland. 5. No new or progressive lung cancer identified   11/16/2022 Imaging   CT chest abdomen pelvis w contrast showed 1. New moderate right pleural effusion with diffuse, nodular,enhancing pleural thickening consistent with pleural metastatic disease. 2. Proximal obstruction of the right upper lobe segmental bronchi with complete atelectasis or consolidation of the right upper lobe, consistent with local malignant recurrence and postobstructive atelectasis of the upper lobe. 3. Interval increase in circumferential soft tissue thickening about the right hilum. Slight interval increase in size in enlarged AP window lymph nodes. 4. Interval enlargement of a subpleural mass of the dependent superior segment left lower lobe consistent with an enlarging pulmonary metastasis or metachronous primary lung malignancy.5. Emphysema. 6. Coronary artery disease. 7. Prostatomegaly.    11/20/2022 Procedure   Thoracentesis right side Pathology is positive for adenocarcinoma.  Insufficient material for ancillary medical testing.    12/03/2022 Imaging   PET scan showed 1. Hypermetabolic mass at the RIGHT hilum consistent  with bronchogenic carcinoma. 2. Postobstructive collapse of the RIGHT upper lobe. 3. Uniform hypermetabolic pleural thickening in the RIGHT lung consistent with pleural metastasis. 4. Large RIGHT effusion. 5. Hypermetabolic nodule in the superior segment of the LEFT lower lobe consistent with pulmonary metastasis. Synchronous bronchogenic carcinoma versus metastatic lesion. 6. Hypermetabolic bilateral mediastinal adenopathy consistent with nodal metastasis. 7. No evidence of metastatic disease outside the thorax. 8. No skeletal metastasis.      12/17/2022 Procedure   Patient underwent biopsy via bronchoscopy for additional tissue Pathology showed  Right upper lobe lavage-positive for non-small cell carcinoma Right upper lobe ENB assisted biopsy showed adenocarcinoma, acinar and solid patterns. Right upper lobe FNA positive for non-small cell carcinoma.  [facility] NGS/PD-L1 showed KEAP1 missense, TP53 missense, SMARCA4 frameshift, PTPRD copy loss, no EGFR, KRAS, BRAF, ALK, ROS1 RET MET ERBB mutations.  TMB 18.9 m/mb, MSS, PD-L1 <1%    12/24/2022  Cancer Staging   Staging form: Lung, AJCC 8th Edition - Pathologic stage from 12/24/2022: Stage IV (pTX, pNX, pM1) - Signed by [name], MD on 12/24/2022 Stage prefix: Initial diagnosis   01/01/2023 -  Chemotherapy   Patient is on Treatment Plan : LUNG NSCLC Carboplatin + Paclitaxel + Bevacizumab Keytruda     05/10/2023 Imaging   /CT chest abdomen pelvis w contrast showed 1. Reduced size of the large right upper lobe central tumor, currently 1.7 cm in transverse thickness and previously 2.2 cm. 2. Reduced size right pleural effusion with a right pleural drainage catheter in place. Currently the effusion is small to moderate.Enhancing right pleural metastatic foci. Trace gas along the pleural space at the lung base. 3. Airspace opacity in the superior segment right lower lobe and posteriorly in the right middle lobe, this along was  previously atelectatic. Pneumonia not excluded. 4. Subpleural nodularity in the right lower lobe and right middle lobe observed, index lesion 5 mm in diameter. 5. Mild enlargement of a masslike consolidation in the superior segment left lower lobe, currently 3.0 by 2.0 cm, previously 2.7 by 1.9 cm. 6. Persistent hazy density around the right mainstem bronchus,without a discretely measurable lymph node or lesion in this vicinity. 7. Lymph nodes just outside of the pericardium in the mediastinal adipose tissue measure up to 1.4 cm in short axis, previously 0.9 cm. There is some high density nodularity along the right anterior pericardium for example, possibly from pericardial lesion or adjacent lymph node. 8. Stable small pericardial effusion. 9. Emphysema. 10. Chronic avascular necrosis of both femoral heads. 11. Prominent median lobe of the prostate gland indents the bladder base. 12. Scattered colonic diverticula. 13. Aortic atherosclerosis.    05/10/2023 Imaging   CT chest abdomen pelvis w contrast showed  1. Reduced size of the large right upper lobe central tumor,currently 1.7 cm in transverse thickness and previously 2.2 cm. 2. Reduced size right pleural effusion with a right pleural drainage catheter in place. Currently the effusion is small to moderate.Enhancing right pleural metastatic foci. Trace gas along the pleural space at the lung base. 3. Airspace opacity in the superior segment right lower lobe and posteriorly in the right middle lobe, this along was previously atelectatic. Pneumonia not excluded. 4. Subpleural nodularity in the right lower lobe and right middle lobe observed, index lesion 5 mm in diameter. 5. Mild enlargement of a masslike consolidation in the superior segment left lower lobe, currently 3.0 by 2.0 cm, previously 2.7 by 1.9 cm. 6. Persistent hazy density around the right mainstem bronchus, without a discretely measurable lymph node or lesion in this  vicinity. 7. Lymph nodes just outside of the pericardium in the mediastinal adipose tissue measure up to 1.4 cm in short axis, previously 0.9 cm. There is some high density nodularity along the right anterior pericardium for example, possibly from pericardial lesion or adjacent lymph node. 8. Stable small pericardial effusion. 9. Emphysema. 10. Chronic avascular necrosis of both femoral heads. 11. Prominent median lobe of the prostate gland indents the bladder base. 12. Scattered colonic diverticula. 13. Aortic atherosclerosis.     02/24/2024 Imaging   CT chest w contrast  Right upper lobe collapse with known central right upper lobe/endobronchial mass, poorly visualized.   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 Adjacent right juxta diaphragmatic nodal metastases, improved. Malpositioned right chest port, as above, unchanged. Aortic Atherosclerosis (ICD10-I70.0) and Emphysema (ICD10-J43.9).  ALLERGIES:  is allergic to penicillins.  MEDICATIONS:  Current Facility-Administered Medications  Medication Dose Route Frequency Provider Last Rate Last Admin   acetaminophen (TYLENOL) tablet 650 mg  650 mg Oral Q6H PRN Verdene Lennert, MD       Or   acetaminophen (TYLENOL) suppository 650 mg  650 mg Rectal Q6H PRN Verdene Lennert, MD       amiodarone (PACERONE) tablet 200 mg  200 mg Oral BID Verdene Lennert, MD   200 mg at 06/05/23 0836   apixaban (ELIQUIS) tablet 5 mg  5 mg Oral BID Verdene Lennert, MD   5 mg at 06/05/23 1093   ascorbic acid (VITAMIN C) tablet 1,000 mg  1,000 mg Oral Daily Verdene Lennert, MD   1,000 mg at 06/05/23 2355   aspirin EC tablet 81 mg  81 mg Oral Daily Verdene Lennert, MD   81 mg at 06/05/23 0835   azithromycin (ZITHROMAX) 500 mg in sodium chloride 0.9 % 250 mL IVPB  500 mg Intravenous Q24H Rockwell Alexandria, RPH 250 mL/hr at 06/05/23 1546 500 mg at 06/05/23  1546   cefTRIAXone (ROCEPHIN) 1 g in sodium chloride 0.9 % 100 mL IVPB  1 g Intravenous Q24H Rockwell Alexandria, RPH   Stopped at 06/05/23 1548   chlorpheniramine-HYDROcodone (TUSSIONEX) 10-8 MG/5ML suspension 5 mL  5 mL Oral Q12H PRN Verdene Lennert, MD       cholecalciferol (VITAMIN D3) 25 MCG (1000 UNIT) tablet 1,000 Units  1,000 Units Oral Daily Verdene Lennert, MD   1,000 Units at 06/05/23 0835   insulin aspart (novoLOG) injection 0-9 Units  0-9 Units Subcutaneous TID WC Verdene Lennert, MD   2 Units at 06/05/23 0837   ipratropium-albuterol (DUONEB) 0.5-2.5 (3) MG/3ML nebulizer solution 3 mL  3 mL Nebulization Q6H Verdene Lennert, MD   3 mL at 06/05/23 1416   magnesium oxide (MAG-OX) tablet 400 mg  400 mg Oral Daily Verdene Lennert, MD   400 mg at 06/05/23 0836   megestrol (MEGACE) tablet 80 mg  80 mg Oral BID Verdene Lennert, MD   80 mg at 06/05/23 0925   metoprolol tartrate (LOPRESSOR) tablet 50 mg  50 mg Oral BID Verdene Lennert, MD   50 mg at 06/05/23 0834   mirabegron ER (MYRBETRIQ) tablet 50 mg  50 mg Oral Daily Verdene Lennert, MD   50 mg at 06/05/23 7322   nystatin (MYCOSTATIN) 100000 UNIT/ML suspension 500,000 Units  5 mL Oral QID Verdene Lennert, MD   500,000 Units at 06/05/23 1415   ondansetron (ZOFRAN) tablet 4 mg  4 mg Oral Q6H PRN Verdene Lennert, MD       Or   ondansetron (ZOFRAN) injection 4 mg  4 mg Intravenous Q6H PRN Verdene Lennert, MD       polyethylene glycol (MIRALAX / GLYCOLAX) packet 17 g  17 g Oral Daily PRN Verdene Lennert, MD       pravastatin (PRAVACHOL) tablet 20 mg  20 mg Oral QHS Verdene Lennert, MD   20 mg at 06/04/23 2118   [START ON 06/16/2023] predniSONE (DELTASONE) tablet 40 mg  40 mg Oral Q breakfast Verdene Lennert, MD       sodium chloride flush (NS) 0.9 % injection 10 mL  10 mL Intravenous Q12H Janith Lima, MD   10 mL at 06/05/23 0929   sodium chloride flush (NS) 0.9 % injection 3 mL  3 mL Intravenous Q12H Verdene Lennert, MD   3 mL at 06/05/23 0929    tamsulosin (FLOMAX) capsule  0.4 mg  0.4 mg Oral BID Verdene Lennert, MD   0.4 mg at 06/05/23 2956   Current Outpatient Medications  Medication Sig Dispense Refill   acetaminophen (TYLENOL) 500 MG tablet Take 500 mg by mouth every 4 (four) hours as needed for mild pain (pain score 1-3), moderate pain (pain score 4-6), fever or headache.     albuterol (VENTOLIN HFA) 108 (90 Base) MCG/ACT inhaler Inhale 2 puffs into the lungs every 4 (four) hours as needed.     amiodarone (PACERONE) 200 MG tablet One tablet twice a day for two weeks then one tablet daily after that 54 tablet 0   amLODipine (NORVASC) 10 MG tablet Take 1 tablet (10 mg total) by mouth daily. 30 tablet 0   apixaban (ELIQUIS) 5 MG TABS tablet Take 5 mg by mouth 2 (two) times daily.     ascorbic acid (VITAMIN C) 1000 MG tablet Take 1,000 mg by mouth daily.     aspirin 81 MG tablet Take 81 mg by mouth daily.     cetirizine (ZYRTEC) 10 MG tablet Take 1 tablet by mouth daily as needed for rhinitis or allergies.     cholecalciferol (VITAMIN D3) 25 MCG (1000 UNIT) tablet Take 1,000 Units by mouth daily.     fluticasone (FLONASE) 50 MCG/ACT nasal spray Place 1 spray into both nostrils daily as needed for rhinitis or allergies.     gabapentin (NEURONTIN) 300 MG capsule Take 300 mg by mouth at bedtime as needed.      guaiFENesin-codeine (ROBITUSSIN AC) 100-10 MG/5ML syrup Take 5 mLs by mouth 3 (three) times daily as needed for cough. 473 mL 0   ipratropium-albuterol (DUONEB) 0.5-2.5 (3) MG/3ML SOLN Take 3 mLs by nebulization every 6 (six) hours as needed.     lisinopril (PRINIVIL,ZESTRIL) 40 MG tablet Take 40 mg by mouth every morning.     magnesium oxide (MAG-OX) 400 (240 Mg) MG tablet Take 400 mg by mouth daily.     megestrol (MEGACE) 40 MG tablet Take 2 tablets (80 mg total) by mouth 2 (two) times daily. 120 tablet 1   metoprolol tartrate (LOPRESSOR) 50 MG tablet Take 50 mg by mouth 2 (two) times daily.     mirabegron ER (MYRBETRIQ) 50 MG  TB24 tablet Take 1 tablet (50 mg total) by mouth daily. 30 tablet 11   nystatin (MYCOSTATIN) 100000 UNIT/ML suspension Take 5 mLs (500,000 Units total) by mouth 4 (four) times daily. Swish and swallow. 473 mL 2   potassium chloride (KLOR-CON) 10 MEQ tablet Take 1 tablet (10 mEq total) by mouth daily. 90 tablet 1   pravastatin (PRAVACHOL) 20 MG tablet Take 20 mg by mouth at bedtime.     prochlorperazine (COMPAZINE) 10 MG tablet Take 1 tablet (10 mg total) by mouth every 6 (six) hours as needed for nausea or vomiting. 90 tablet 3   senna-docusate (SENOKOT-S) 8.6-50 MG tablet Take 2 tablets by mouth daily. (Patient taking differently: Take 2 tablets by mouth daily as needed for moderate constipation or mild constipation.) 60 tablet 2   spironolactone (ALDACTONE) 25 MG tablet Take 25 mg by mouth every other day.     tamsulosin (FLOMAX) 0.4 MG CAPS capsule Take 0.4 mg by mouth 2 (two) times daily.     Facility-Administered Medications Ordered in Other Encounters  Medication Dose Route Frequency Provider Last Rate Last Admin   sodium chloride flush (NS) 0.9 % injection 10 mL  10 mL Intravenous PRN [name], MD   10 mL  at 03/18/18 0858    VITAL SIGNS: BP (!) 145/74   Pulse 98   Temp 98.2 F (36.8 C) (Oral)   Resp (!) 21   Ht 6' (1.829 m)   Wt 171 lb 11.8 oz (77.9 kg)   SpO2 91%   BMI 23.29 kg/m  Filed Weights   06/04/23 1435  Weight: 171 lb 11.8 oz (77.9 kg)    Estimated body mass index is 23.29 kg/m as calculated from the following:   Height as of this encounter: 6' (1.829 m).   Weight as of this encounter: 171 lb 11.8 oz (77.9 kg).  LABS: CBC:    Component Value Date/Time   WBC 10.9 (H) 06/05/2023 0510   HGB 9.5 (L) 06/05/2023 0510   HGB 10.1 (L) 05/28/2023 0758   HGB 15.8 07/10/2014 1328   HCT 29.1 (L) 06/05/2023 0510   HCT 47.2 07/10/2014 1328   PLT 214 06/05/2023 0510   PLT 257 05/28/2023 0758   PLT 236 07/10/2014 1328   MCV 88.7 06/05/2023 0510   MCV 93 07/10/2014 1328    NEUTROABS 12.4 (H) 06/04/2023 1447   NEUTROABS 7.0 (H) 07/10/2014 1328   LYMPHSABS 0.3 (L) 06/04/2023 1447   LYMPHSABS 2.0 07/10/2014 1328   MONOABS 1.4 (H) 06/04/2023 1447   MONOABS 0.8 07/10/2014 1328   EOSABS 0.0 06/04/2023 1447   EOSABS 0.1 07/10/2014 1328   BASOSABS 0.0 06/04/2023 1447   BASOSABS 0.1 07/10/2014 1328   Comprehensive Metabolic Panel:    Component Value Date/Time   NA 135 06/05/2023 0510   NA 138 07/10/2014 1328   K 3.4 (L) 06/05/2023 0510   K 3.1 (L) 07/10/2014 1328   CL 100 06/05/2023 0510   CL 101 07/10/2014 1328   CO2 24 06/05/2023 0510   CO2 31 07/10/2014 1328   BUN 11 06/05/2023 0510   BUN 14 07/10/2014 1328   CREATININE 0.48 (L) 06/05/2023 0510   CREATININE 0.52 (L) 05/28/2023 0758   CREATININE 1.16 07/10/2014 1328   GLUCOSE 150 (H) 06/05/2023 0510   GLUCOSE 164 (H) 07/10/2014 1328   CALCIUM 8.4 (L) 06/05/2023 0510   CALCIUM 8.9 07/10/2014 1328   AST 18 06/05/2023 0510   AST 18 05/28/2023 0758   ALT 19 06/05/2023 0510   ALT 15 05/28/2023 0758   ALT 19 07/10/2014 1328   ALKPHOS 62 06/05/2023 0510   ALKPHOS 60 07/10/2014 1328   BILITOT 0.6 06/05/2023 0510   BILITOT 0.5 05/28/2023 0758   PROT 6.5 06/05/2023 0510   PROT 7.4 07/10/2014 1328   ALBUMIN 1.9 (L) 06/05/2023 0510   ALBUMIN 3.4 07/10/2014 1328    RADIOGRAPHIC STUDIES: DG Ankle Complete Left  Result Date: 06/04/2023 CLINICAL DATA:  Status post fall 2 days ago with left ankle pain and swelling EXAM: LEFT ANKLE COMPLETE - 3 VIEW COMPARISON:  None Available. FINDINGS: There are no findings of fracture or dislocation. No joint effusion. Subtle sclerosis in the distal fibular diaphysis. Ankle mortise is intact. Soft tissues are unremarkable. IMPRESSION: 1. No acute fracture or dislocation. 2. Subtle sclerosis in the distal fibular diaphysis may reflect sequela of remote injury. Electronically Signed   By: Agustin Cree M.D.   On: 06/04/2023 20:35   CT Angio Chest PE W and/or Wo  Contrast  Result Date: 06/04/2023 CLINICAL DATA:  Lung carcinoma, shortness of breath EXAM: CT ANGIOGRAPHY CHEST WITH CONTRAST TECHNIQUE: Multidetector CT imaging of the chest was performed using the standard protocol during bolus administration of intravenous contrast. Multiplanar CT image  reconstructions and MIPs were obtained to evaluate the vascular anatomy. RADIATION DOSE REDUCTION: This exam was performed according to the departmental dose-optimization program which includes automated exposure control, adjustment of the mA and/or kV according to patient size and/or use of iterative reconstruction technique. CONTRAST:  75mL OMNIPAQUE IOHEXOL 350 MG/ML SOLN COMPARISON:  05/08/2023 FINDINGS: Cardiovascular: Right IJ port catheter loops in the IJ tip in the right brachiocephalic vein as before. SVC patent. Heart size normal. Small pericardial effusion. Satisfactory opacification of pulmonary arteries noted, and there is no evidence of pulmonary emboli. Scattered left coronary calcifications. Adequate contrast opacification of the thoracic aorta with no evidence of dissection, aneurysm, or stenosis. There is scattered plaque in the arch and descending thoracic aorta. Classic 3-vessel brachiocephalic arch anatomy without proximal stenosis. Mediastinum/Nodes: Right perihilar consolidation extending around the distal right mainstem bronchus with mild narrowing. Anterior pericardial lymph nodes measure up to 1.1 cm short axis diameter, stable. Stable 8 mm AP window node. New 1.1 cm left hilar adenopathy. New discontinuity of the mainstem bronchus along its distal right aspect just above the carina. Contained extraluminal gas medial to the consolidated right upper lung. Lungs/Pleura: Tunneled right pleural drain catheter at the lung base with relatively stable loculated basilar effusion. Increasing airspace consolidation in apical and posterior segments right upper lung. Pulmonary emphysema. Stable 2.7 cm pleural  based consolidation in the superior segment left lower lobe. Upper Abdomen: No adrenal mass.  No acute findings. Musculoskeletal: Vertebral endplate spurring at multiple levels in the lower thoracic spine. Stable sclerosis in the T10 vertebral body. Review of the MIP images confirms the above findings. IMPRESSION: 1. Negative for acute PE or thoracic aortic dissection. 2. Increasing airspace consolidation in the right upper lung with new discontinuity of the distal right mainstem bronchus and contained extraluminal gas medial to the consolidated right upper lung. 3. Stable loculated right pleural effusion with tunneled pleural drain catheter in place. 4. New 1.1 cm left hilar adenopathy. 5. Aortic Atherosclerosis (ICD10-I70.0) and Emphysema (ICD10-J43.9). Electronically Signed   By: Corlis Leak M.D.   On: 06/04/2023 18:44   DG Chest 2 View  Result Date: 06/04/2023 CLINICAL DATA:  Weakness. Chest pain. 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 EXAM: CHEST - 2 VIEW COMPARISON:  Chest radiograph dated May 26, 2023. FINDINGS: Right internal jugular Port-A-Cath with tip projecting over the proximal SVC. Similar position of a tunneled pleural drainage catheter at the right lung base. The loculated right pleural effusion is not significantly changed in volume compared to the prior exam. Similar right apical opacification. Slightly increased airspace opacities in the right upper lung. No focal consolidation in the left lung. Visualized osseous structures are unchanged. IMPRESSION: 1. Similar loculated right pleural effusion with tunneled pleural drainage catheter at the right lung base. 2. Mildly increased airspace opacities in the right upper lung. 3. Similar right apical opacification. Electronically Signed   By: Hart Robinsons M.D.   On: 06/04/2023 15:48   DG Chest 2 View  Result Date: 05/26/2023 CLINICAL DATA:  CHF. Stage IV right upper lobe adenocarcinoma on chemotherapy  and immunotherapy. EXAM: CHEST - 2 VIEW COMPARISON:  05/25/23 FINDINGS: There is a right IJ porta catheter with tip in the projection of the proximal SVC. The tunneled pleural drainage catheter is again noted overlying the right lung base. The loculated right pleural effusion appears mildly increased in volume compared with the prior examination. Unchanged right apical opacification. Airspace opacities within the right upper lung is again noted  in appears increased from the previous exam. Left lung is clear. Osseous structures appear intact. IMPRESSION: 1. Mild increase in volume of loculated right pleural effusion with tunneled pleural drainage catheter in place. 2. Increased airspace opacities within the right upper lung. 3. Unchanged right apical opacification. Electronically Signed   By: Signa Kell M.D.   On: 05/26/2023 07:59   DG Chest Portable 1 View  Result Date: 05/25/2023 CLINICAL DATA:  Shortness of breath.  Lung cancer. EXAM: PORTABLE CHEST 1 VIEW COMPARISON:  Chest CT 05/08/2023.  Chest x-ray 03/08/2023. FINDINGS: Similar volume loss right hemithorax. Airspace disease noted right upper lung compatible with the consolidative disease seen on chest CT. Asymmetric elevation right hemidiaphragm. Small right pleural effusion with pleural drain identified at the right lung base. Opacity in the right apex compatible with loculated fluid and lung collapse as seen on the previous CT scan. Right Port-A-Cath again noted with tip position probably in the low right innominate vein. Left lung clear. Cardiopericardial silhouette is at upper limits of normal for size. Telemetry leads overlie the chest. IMPRESSION: 1. Similar volume loss right hemithorax with airspace disease right upper lung compatible with the consolidative disease seen on chest CT. 2. Small right pleural effusion with pleural drain at the right lung base. 3. Opacity in the right apex compatible with loculated fluid and lung collapse as seen on  the previous CT scan. Electronically Signed   By: Kennith Center M.D.   On: 05/25/2023 14:22   CT HEAD WO CONTRAST ( )  Result Date: 05/21/2023 CLINICAL DATA:  Status post fall. Dizziness. On blood thinners. History of lung cancer. EXAM: CT HEAD WITHOUT CONTRAST TECHNIQUE: Contiguous axial images were obtained from the base of the skull through the vertex without intravenous contrast. RADIATION DOSE REDUCTION: This exam was performed according to the departmental dose-optimization program which includes automated exposure control, adjustment of the mA and/or kV according to patient size and/or use of iterative reconstruction technique. COMPARISON:  Head MRI 12/05/2022 FINDINGS: Brain: There is no evidence of an acute infarct, intracranial hemorrhage, mass, midline shift, or extra-axial fluid collection. Slight cerebral atrophy is within normal limits for age. 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 Cerebral white matter hypodensities are nonspecific but compatible with minimal chronic small vessel ischemic disease. Vascular: No hyperdense vessel. Skull: No acute fracture or suspicious osseous lesion. Sinuses/Orbits: Minimal fluid in the left sphenoid sinus. Clear mastoid air cells. Left cataract extraction. Other: None. IMPRESSION: No evidence of acute intracranial abnormality. Electronically Signed   By: Sebastian Ache M.D.   On: 05/21/2023 13:18   CT CHEST ABDOMEN PELVIS W CONTRAST  Result Date: 05/10/2023 CLINICAL DATA:  Right lung cancer restaging * Tracking Code: BO * EXAM: CT CHEST, ABDOMEN, AND PELVIS WITH CONTRAST TECHNIQUE: Multidetector CT imaging of the chest, abdomen and pelvis was performed following the standard protocol during bolus administration of intravenous contrast. RADIATION DOSE REDUCTION: This exam was performed according to the departmental dose-optimization program which includes automated exposure control, adjustment of the mA and/or kV according to patient  size and/or use of iterative reconstruction technique. CONTRAST:  80mL OMNIPAQUE IOHEXOL 300 MG/ML  SOLN COMPARISON:  02/18/2023 (chest) and 11/16/2022 (abdomen/pelvis). FINDINGS: CT CHEST FINDINGS Cardiovascular: The Port-A-Cath catheter appears to loop up into the right internal jugular vein and extends caudad to terminate in the right brachiocephalic vein, similar to prior exams including 02/01/2023. Atheromatous vascular calcification of the thoracic aorta. Stable small pericardial effusion. Mediastinum/Nodes: Persistent hazy density around the  right mainstem bronchus, without a discretely measurable lymph node or lesion in this vicinity. Lymph nodes just outside of the pericardium in the mediastinal adipose tissue measure up to 1.4 cm in short axis on image 47 series 2, previously 0.9 cm. There is some high density nodularity along the right anterior pericardium for example on image 46 series 2, possibly from pericardial lesion or adjacent lymph node. Previously hypermetabolic AP window lymph node 0.6 cm in short axis on image 28 series 2, formerly 0.8 cm on 02/18/2023. Lungs/Pleura: Collapsed and partially consolidated right upper lobe, hypodense medial portion measures about 1.7 cm in thickness on image 25 series 2, formerly about 2.2 cm in thickness. This hypodense portion the may represent original tumor but is indistinctly marginated against the atelectatic lung. The right upper lobe bronchus is truncated. Reduced size right pleural effusion with a right pleural drainage catheter in place. Currently the effusion is small to moderate. Notable enhancement along the parietal pleura. Trace gas along the pleural space at the lung base. Emphysema. Previously hypermetabolic posterior consolidation in the superior segment right lower lobe and posteriorly in the right middle lobe, this along was previously atelectatic. Current appearance could be from pneumonia or aspiration pneumonitis, versus prior radiation  therapy. Scattered pleural nodularity anteriorly along the right middle lobe, small pleural deposits of tumor not excluded. Subpleural nodularity in the right lower lobe observed, index lesion 5 mm in diameter on image 117 series 4. Stable left apical pleuroparenchymal scarring. A 3.0 by 2.0 cm masslike consolidation in the superior segment left lower lobe previously measured 2.7 by 1.9 cm by my measurements. Accordingly this is mildly enlarged. Musculoskeletal: Lower thoracic spondylosis. Probable sebaceous cyst or similar benign lesion along the junction of the right posterior neck and chest, 1.2 cm in short axis on image 1 series 2, previously not hypermetabolic. 0.9 cm sclerotic left eleventh rib lesion medially on image 57 series 2, not previously hypermetabolic. Probably a bone island. CT ABDOMEN PELVIS FINDINGS Hepatobiliary: Contracted gallbladder.  Otherwise unremarkable. Pancreas: Unremarkable Spleen: Unremarkable Adrenals/Urinary Tract: Fullness of both adrenal glands without a discrete mass. The kidneys appear unremarkable. Stomach/Bowel: Scattered colonic diverticula. Vascular/Lymphatic: Small right gastric node 0.7 cm in short axis on image 65 series 2 Atherosclerosis is present, including aortoiliac atherosclerotic disease. Reproductive: Prominent median lobe of the prostate gland indents the bladder base. Other: Unremarkable Musculoskeletal: Left-sided lipoma interposed between the gluteus maximus and the greater trochanter. Hemangioma eccentric to the left in the L2 vertebral body, image 78 series 2. Probable bone island in the right iliac bone. Chronic avascular necrosis of both femoral heads. Degenerative endplate sclerosis inferiorly along the L3 endplate with a sclerotic lesion eccentric to the left in the L3 vertebral body which is nonspecific but not previously hypermetabolic. Probable superficial sebaceous cyst along the right upper thigh/groin region, 1.2 cm in short axis on image 132 series  2, present and not previously hypermetabolic PET-CT 12/03/2022. IMPRESSION: 1. Reduced size of the large right upper lobe central tumor, currently 1.7 cm in transverse thickness and previously 2.2 cm. 2. Reduced size right pleural effusion with a right pleural drainage catheter in place. Currently the effusion is small to moderate. Enhancing right pleural metastatic foci. Trace gas along the pleural space at the lung base. 3. Airspace opacity in the superior segment right lower lobe and posteriorly in the right middle lobe, this along was previously atelectatic. Pneumonia not excluded. 4. Subpleural nodularity in the right lower lobe and right middle lobe observed, index lesion  5 mm in diameter. 5. Mild enlargement of a masslike consolidation in the superior segment left lower lobe, currently 3.0 by 2.0 cm, previously 2.7 by 1.9 cm. 6. Persistent hazy density around the right mainstem bronchus, without a discretely measurable lymph node or lesion in this vicinity. 7. Lymph nodes just outside of the pericardium in the mediastinal adipose tissue measure up to 1.4 cm in short axis, previously 0.9 cm. There is some high density nodularity along the right anterior pericardium for example, possibly from pericardial lesion or adjacent lymph node. 8. Stable small pericardial effusion. 9. Emphysema. 10. Chronic avascular necrosis of both femoral heads. 11. Prominent median lobe of the prostate gland indents the bladder base. 12. Scattered colonic diverticula. 13. Aortic atherosclerosis. Aortic Atherosclerosis (ICD10-I70.0) and Emphysema (ICD10-J43.9). Electronically Signed   By: Gaylyn Rong M.D.   On: 05/10/2023 08:47    PERFORMANCE STATUS (ECOG) : 2 - Symptomatic, <50% confined to bed  Review of Systems Unless otherwise noted, a complete review of systems is negative.  Physical Exam General: NAD Pulmonary: Unlabored Extremities: no edema, no joint deformities Skin: no rashes Neurological: Weakness but  otherwise nonfocal  IMPRESSION: Patient with COPD on nocturnal O2, stage IV lung cancer on palliative chemotherapy, who presented to the emergency department with worsening shortness of breath.  CTA of the chest was negative for PE but showed increasing airspace consolidation of the right upper lung and stable loculated right pleural effusion with pleural catheter in place.  Patient being treated for postobstructive pneumonia.  Patient seen in the emergency department.  His son was also at bedside.  Patient describes doing well on cancer treatment until he became acutely short of breath several days ago leading to his presentation to the emergency department.  He is feeling better currently.  Discussed general goals.  Patient wants to speak with Dr. Cathie Hoops to get her input on how to proceed.  However, patient says he is inclined to continue receiving cancer treatment.  He is in agreement with current scope of treatment for his pneumonia.  Patient appropriately decided today to change CODE STATUS to DNR/DNI.  PLAN: -Continue current scope of treatment -Agree with DNR/DNI -Will plan outpatient follow-up  Case and plan discussed with Dr. Cathie Hoops  Time Total: 30 minutes  Visit consisted of counseling and education dealing with the complex and emotionally intense issues of symptom management and palliative care in the setting of serious and potentially life-threatening illness.Greater than 50%  of this time was spent counseling and coordinating care related to the above assessment and plan.  Signed by: Laurette Schimke, PhD, NP-C

## 2023-06-05 NOTE — ED Notes (Signed)
Oncology, Dr. Cathie Hoops at bedside.

## 2023-06-05 NOTE — Progress Notes (Signed)
Progress Note   Patient: Mark Mccarty IHK:[id number] DOB: Jul 28, 1950 DOA: 06/04/2023     1 DOS: the patient was seen and examined on 06/05/2023   Brief hospital course: 72yo with h/o stage 4 lung cancer on palliative chemo with malignant effusion s/p Pleurx catheter, afib on Eliquis, HFpEF, DM, COPD, HTN, and HLD who presented on 12/3 with worsening weakness.  He was diagnosed with sepsis due to postobstructive PNA and treated with Vanc/Cefepime -> Ceftriaxone/Azithromycin.  He is having frequent 3-4 beat runs of wide complex V tach this AM.  Assessment and Plan:  Sepsis due to postobstructive PNA Patient presenting with tachycardia, tachypnea, elevated WBC with CT imaging concerning for obstructive pneumonia Telemetry monitoring S/p Vancomycin, Cefepime and Azithromycin -> Ceftriaxone and Azithromycin Blood cultures NTD x 24 hours Procalcitonin 2.42 S/p 2.5 L bolus Continue maintenance fluids CT imaging with increasing airspace consolidation of the right upper lung with new discontinuity of the distal right mainstem bronchus Per chart review, patient's known stage IV cancer involved in the right upper lobe previously, however opacity in is enlarging, concerning for post obstructive pneumonia. Consider pulmonology consultation Oncology consulted Continue supplemental oxygen to maintain oxygen saturation above 88%   COPD with acute exacerbation  In the setting of pneumonia, with increased shortness of breath and cough. Solu-Medrol 125 mg once followed by prednisone 40 mg daily DuoNebs every 6 hours   Essential hypertension Previous history of hypertension, however it seems antihypertensives have been on hold due to hypotension. Continue to hold home antihypertensives   Primary lung adenocarcinoma, right  Patient had an original history of stage IIIa lung cancer followed by stage IV adenocarcinoma, currently on palliative chemotherapy.   Gven changes on CT imaging, patient's  cancer appears to be advancing. Oncology consulted with palliative care Continue regular Pleurx drainage   Atrial Flutter-fibrillation with runs of Vtach History of atrial fibrillation/flutter with recent cardioversion on 05/25/2023 He is having repeated runs of vtach  Continue Toprol XL, Amiodarone Consider cardiology consult   Goals of care, counseling/discussion Patient understood on admission that his cancer is noncurable and potentially advancing He is open to a palliative care consultation. Given concern for developing arrhythmia, further discussed code status and concern for disease progression Patient is now DNR/DNI   Type 2 diabetes mellitus with other specified complication  A1c is 6, good control Cover with sensitive-scale SSI    Consultants: Palliative care Oncology  Procedures: None  Antibiotics: Azithromycin 12/4-8 Ceftriaxone 12/4-8 Vancomycin x 1 Cefepime x 1  30 Day Unplanned Readmission Risk Score    Flowsheet Row ED to Hosp-Admission (Current) from 06/04/2023 in [facility] Emergency Department at [facility]  30 Day Unplanned Readmission Risk Score (%) 37.85 Filed at 06/05/2023 0801       This score is the patient's risk of an unplanned readmission within 30 days of being discharged (0 -100%). The score is based on dignosis, age, lab data, medications, orders, and past utilization.   Low:  0-14.9   Medium: 15-21.9   High: 22-29.9   Extreme: 30 and above          Subjective: Felt ok this AM.  Understands his cancer and if his "time is short" he would prefer to go home.  Willing to consider hospice.  Physical Exam: Vitals:   06/04/23 2345 06/05/23 0030 06/05/23 0130 06/05/23 0435  BP:  133/76 125/75 (!) 152/77  Pulse: 92 94 98 (!) 103  Resp: 19 18 18  (!) 21  Temp:  (!) 97.4 F (  36.3 C)  98.2 F (36.8 C)  TempSrc:  Oral    SpO2: 96% 96% 95% 97%  Weight:      Height:         Intake/Output Summary (Last 24 hours) at 06/05/2023  0808 Last data filed at 06/05/2023 0442 Gross per 24 hour  Intake 2040.92 ml  Output 425 ml  Net 1615.92 ml   Filed Weights   06/04/23 1435  Weight: 77.9 kg    Exam:  General:  Appears calm and comfortable and is in NAD, on RA Eyes:  PERRL, EOMI, normal lids, iris ENT:  grossly normal hearing, lips & tongue, mmm Neck:  no LAD, masses or thyromegaly Cardiovascular:  Irregularly irregular. No LE edema.  Respiratory:   CTA bilaterally with no wheezes/rales/rhonchi.  Normal respiratory effort. Abdomen:  soft, NT, ND Skin:  no rash or induration seen on limited exam Musculoskeletal:  grossly normal tone BUE/BLE, good ROM, no bony abnormality Psychiatric:  blunted mood and affect, speech fluent and appropriate, AOx3 Neurologic:  CN 2-12 grossly intact, moves all extremities in coordinated fashion  Data Reviewed: I have reviewed the patient's lab results since admission.  Pertinent labs for today include:  K+ 3.4 Glucose 150 Albumin 1.9 WBC 10.9 Hgb 9.5    Family Communication: None present; son was present during palliative care discussion later in the morning  Disposition: Status is: Inpatient Remains inpatient appropriate because: ongoing evaluation and treatment  Planned Discharge Destination: Home    Time spent: 50 minutes  Author: Jonah Blue, MD 06/05/2023 8:05 AM  For on call review [email or web address].

## 2023-06-05 NOTE — Progress Notes (Signed)
PHARMACY CONSULT NOTE - ELECTROLYTES  Pharmacy Consult for Electrolyte Monitoring and Replacement   Recent Labs: Height: 6' (182.9 cm) Weight: 77.9 kg (171 lb 11.8 oz) IBW/kg (Calculated) : 77.6 Estimated Creatinine Clearance: 93 mL/min (A) (by C-G formula based on SCr of 0.48 mg/dL (L)). Potassium (mmol/L)  Date Value  06/05/2023 3.4 (L)  07/10/2014 3.1 (L)   Magnesium (mg/dL)  Date Value  95/62/1308 1.6 (L)   Calcium (mg/dL)  Date Value  65/78/4696 8.4 (L)   Calcium, Total (mg/dL)  Date Value  29/52/8413 8.9   Albumin (g/dL)  Date Value  24/40/1027 1.9 (L)  07/10/2014 3.4   Phosphorus (mg/dL)  Date Value  25/36/6440 3.4   Sodium (mmol/L)  Date Value  06/05/2023 135  07/10/2014 138   Corrected Ca: 10.6 mg/dL  Assessment  Mark Mccarty is a 72 y.o. male presenting with worsening weakness. PMH significant for Stage IV adenocarcinoma on palliative chemotherapy, atrial fibrillation/flutter on Eliquis, HFpEF, Type 2 diabetes mellitus, COPD, malignant pleural effusion s/p Pleurx catheter, hyperlipidemia, hypertension. Pharmacy has been consulted to monitor and replace electrolytes.  Diet: PO MIVF: None Pertinent medications: magnesium oxide 400 mg daily  Goal of Therapy: Electrolytes WNL  Plan:  Give KCl 20 mEq PO x 1 No recent Mag or Phos Check BMP, Mg, Phos with AM labs  Thank you for allowing pharmacy to be a part of this patient's care.  Merryl Hacker, PharmD Clinical Pharmacist 06/05/2023 8:30 AM

## 2023-06-05 NOTE — ED Notes (Signed)
Pt. Cleaned by this RN and abby, RN. Bed bath provided with warm wipes and pt. Dried with towels. New brief and chuck applied. Changed pt's shirt and undershirt per request.

## 2023-06-05 NOTE — ED Notes (Signed)
Pt. Having frequent 3-4 beat runs of wide complex QRS, strip captured and sent to chart. Dr. Ophelia Charter notified.

## 2023-06-05 NOTE — ED Notes (Signed)
Oncology at bedside

## 2023-06-05 NOTE — ED Notes (Signed)
Dr. Ophelia Charter at bedside. pt. Had 8 beat VT, Dr. Ophelia Charter at bedside, aware. Pt. Asymptomatic.

## 2023-06-05 NOTE — Progress Notes (Signed)
Pt arrived to floor on 2L O2, home O2 dose. Pt is alert and oriented. Family at bedside. NAD.

## 2023-06-05 NOTE — ED Notes (Signed)
Dr. Ophelia Charter at bedside had thorough discussion with pt. About goals of care and end of life decisions. Pt. Verbalized that he does not want to be full code, Dr. Ophelia Charter states she will change him to DRN. See orders and consults.

## 2023-06-05 NOTE — Consult Note (Signed)
Hematology/Oncology Consult note [phone] [phone]      Patient Care Team: [name], NP as PCP - General (Family Medicine) [name], RN as Registered Nurse [name], MD as Consulting Physician (Oncology) [name], MD as Referring Physician (Radiation Oncology) Annice Needy, MD as Referring Physician (Vascular Surgery)   Name of the patient: Mark Mccarty  [id number]  [birth year]   REASON FOR COSULTATION:  Stage IV lung cancer, obstructive pneumonia History of presenting illness-  72 y.o. male with PMH listed at below who presents to ER for evaluation of generalized weakness, worsened productive cough, shortness of breath. Due to the weakness, he had a fall.  Denies any loss of consciousness or head trauma. Weakness has made him hard to ambulate.  Appetite is not good. Denies any fever, chills, chest pain, palpitation, diarrhea.   Allergies  Allergen Reactions   Penicillins Rash    Stomach hurt Has patient had a PCN reaction causing immediate rash, facial/tongue/throat swelling, SOB or lightheadedness with hypotension: yes Has patient had a PCN reaction causing severe rash involving mucus membranes or skin necrosis: no Has patient had a PCN reaction that required hospitalization: no Has patient had a PCN reaction occurring within the last 10 years: yes If all of the above answers are "NO", then may proceed with Cephalosporin use.     Patient Active Problem List   Diagnosis Date Noted   Primary lung adenocarcinoma, right (HCC) 12/13/2017    Priority: High   Hypotension 05/28/2023    Priority: Medium    Thrush 05/28/2023    Priority: Medium    Encounter for antineoplastic chemotherapy 01/01/2023    Priority: Medium    Weight loss 01/01/2023    Priority: Medium    Pleural effusion 12/17/2022    Priority: Medium    Anxiety associated with cancer diagnosis (HCC) 11/21/2022    Priority: Low   Port-A-Cath in place  03/15/2020    Priority: Low   Hypokalemia 01/07/2018    Priority: Low   Sepsis (HCC) 06/04/2023   Postobstructive pneumonia 06/04/2023   Acute hypoxic respiratory failure (HCC) 06/04/2023   COPD with acute exacerbation (HCC) 06/04/2023   Hypomagnesemia 05/26/2023   Abdominal pain 05/26/2023   Near syncope 05/25/2023   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   Drug-induced neutropenia (HCC) 03/18/2023   Atrial flutter with rapid ventricular response (HCC) 01/10/2023   Vitamin B12 deficiency 09/17/2022   OAB (overactive bladder) 09/22/2021   Liver lesion 03/15/2020   DDD (degenerative disc disease), lumbar 08/04/2019   Obesity (BMI 30.0-34.9) 08/04/2019   Umbilical hernia without obstruction and without gangrene 04/07/2019   Centrilobular emphysema (HCC) 03/18/2019   Peripheral polyneuropathy 03/18/2019   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   H/O adenomatous polyp of colon 01/20/2019   Avascular necrosis of femoral head (HCC) 12/10/2018   [name] 09/18/2018   Elevated PSA, less than 10 ng/ml 03/22/2018   BPH (benign prostatic hyperplasia) 03/19/2018   Benign prostatic hyperplasia with nocturia 03/19/2018   Goals of care, counseling/discussion 12/13/2017   SOB (shortness of breath) on exertion 10/02/2017   Incomplete immunization status 09/14/2017   Pedal edema 12/04/2016   Hyperuricemia 05/02/2016   Type 2 diabetes mellitus with other specified complication (HCC) 05/02/2016   High risk medication use 09/12/2015   S/P cardiac catheterization 12/23/2014   Chest pain with high risk for cardiac etiology 12/16/2014   Abnormal nuclear stress test 12/16/2014   Adenomatous polyp of colon 04/02/2014   Dysphagia 04/02/2014   Family  history of colon cancer 03/06/2014   Gastroesophageal reflux 03/06/2014   Hyperlipidemia, unspecified 03/06/2014   Essential hypertension 03/06/2014   Aortic insufficiency 01/20/2014   Erectile dysfunction 01/20/2014   Former  smoker 01/20/2014   Irregular heart beat 01/20/2014   Left ventricular diastolic dysfunction 01/20/2014   Peptic ulcer disease 01/20/2014     Past Medical History:  Diagnosis Date   Abnormal nuclear stress test    Anxiety    a.) on BZO (lorazepam) PRN   Asthma    Avascular necrosis of femoral head (HCC)    BPH (benign prostatic hyperplasia)    Cataract    a.) s/p extraction on LEFT   Colon adenomas    COPD (chronic obstructive pulmonary disease) (HCC)    Coronary artery disease 11/11/2014   a.) MPI 11/11/2014: EF 56%, mild inf wall ischemia; b.) LHC 12/16/2014: 20% mRCA - med mgmt   DDD (degenerative disc disease), lumbar    Diverticulosis    DM type 2 (diabetes mellitus, type 2) (HCC)    Duodenitis    Dysphagia    Dyspnea    ED (erectile dysfunction)    a.) on PDE5i (sildenafil) PRN   Elevated PSA    Former smoker    Gastritis    GERD (gastroesophageal reflux disease)    [name]    Hilar mass    History of hiatal hernia    Hyperlipidemia    Hypertension    Hyperuricemia    Hypokalemia    Left ventricular diastolic dysfunction 10/04/2016   a.) TTE 10/04/2016: EF 50%, mild MR/TR/PR, mod AR, G1DD; b.) TTE 10/10/2017: EF 45%, triv PR, mild AR/MR/TR, G1DD; c.) TTE 03/30/2020: EF 50%, mild-mod AR, mild MR/TR/PR; d.) TTE 04/23/2022: EF >55%, mild LVH, mild LAE, triv TR, mild PR, mod AR/MR   Liver lesion    Obesity    Pedal edema    Peripheral polyneuropathy    Peyronie's disease 05/2022   Pleural effusion    Primary lung adenocarcinoma, right (HCC)    a.) stage IIIA (cT3, cN1, cM0) --> s/p concurrent chemoradiation 2019 --> carboplatin + pacilitaxel (12/31/2017 - 02/19/2018) followed by 1 year (03/24/2018 - 03/17/2019) adjuvant anti-PDL-1 mAB immunotherapy (durvalumab)   [name] (peptic ulcer disease)    Renal insufficiency    Umbilical hernia    [name]      Past Surgical History:  Procedure Laterality Date   CARDIAC CATHETERIZATION  N/A 12/16/2014   Procedure: Left Heart Cath;  Surgeon: Marcina Millard, MD;  Location: [facility];  Service: Cardiovascular;  Laterality: N/A;   CARDIOVERSION N/A 01/17/2023   Procedure: CARDIOVERSION;  Surgeon: Clotilde Dieter, DO;  Location: [name];  Service: Cardiovascular;  Laterality: N/A;   CATARACT EXTRACTION Left    COLONOSCOPY     COLONOSCOPY WITH PROPOFOL N/A 01/14/2020   Procedure: COLONOSCOPY WITH PROPOFOL;  Surgeon: Toledo, Boykin Nearing, MD;  Location: [facility];  Service: Gastroenterology;  Laterality: N/A;   CYST REMOVAL TRUNK     chest and back over time and it was removed   CYSTECTOMY     ENDOBRONCHIAL ULTRASOUND N/A 12/09/2017   Procedure: ENDOBRONCHIAL ULTRASOUND;  Surgeon: Shane Crutch, MD;  Location: [name];  Service: Pulmonary;  Laterality: N/A;   IR GUIDED DRAIN W CATHETER PLACEMENT  03/21/2023   PORTA CATH INSERTION N/A 12/23/2017   Procedure: PORTA CATH INSERTION;  Surgeon: Annice Needy, MD;  Location: [facility];  Service: Cardiovascular;  Laterality: N/A;   PORTA CATH  INSERTION N/A 01/08/2023   Procedure: PORTA CATH INSERTION;  Surgeon: Annice Needy, MD;  Location: [facility];  Service: Cardiovascular;  Laterality: N/A;   PORTA CATH REMOVAL N/A 06/04/2022   Procedure: PORTA CATH REMOVAL;  Surgeon: Annice Needy, MD;  Location: [facility];  Service: Cardiovascular;  Laterality: N/A;   TEE WITHOUT CARDIOVERSION N/A 01/17/2023   Procedure: TRANSESOPHAGEAL ECHOCARDIOGRAM (TEE);  Surgeon: Clotilde Dieter, DO;  Location: [name];  Service: Cardiovascular;  Laterality: N/A;   UPPER GI ENDOSCOPY     VIDEO BRONCHOSCOPY WITH ENDOBRONCHIAL ULTRASOUND N/A 12/17/2022   Procedure: VIDEO BRONCHOSCOPY WITH ENDOBRONCHIAL ULTRASOUND;  Surgeon: Vida Rigger, MD;  Location: [name];  Service: Thoracic;  Laterality: N/A;    Social History   Socioeconomic History   Marital status: Divorced    Spouse name: Not on file    Number of children: Not on file   Years of education: Not on file   Highest education level: Not on file  Occupational History   Not on file  Tobacco Use   Smoking status: Former    Current packs/day: 0.00    Average packs/day: 1.5 packs/day for 35.0 years (52.5 ttl pk-yrs)    Types: Cigarettes    Start date: 06/21/1980    Quit date: 06/22/2015    Years since quitting: 7.9    Passive exposure: Past   Smokeless tobacco: Never  Vaping Use   Vaping status: Never Used  Substance and Sexual Activity   Alcohol use: Not Currently    Comment: weekends   Drug use: No   Sexual activity: Not Currently  Other Topics Concern   Not on file  Social History Narrative   Lives with son [name].   Social Determinants of Health   Financial Resource Strain: Not on file  Food Insecurity: Not on file  Transportation Needs: Not on file  Physical Activity: Not on file  Stress: Not on file  Social Connections: Unknown (11/14/2021)   Received from [facility], [facility]   Social Network    Social Network: Not on file  Intimate Partner Violence: Unknown (10/05/2021)   Received from [employer], [facility]   HITS    Physically Hurt: Not on file    Insult or Talk Down To: Not on file    Threaten Physical Harm: Not on file    Scream or Curse: Not on file     Family History  Problem Relation Age of Onset   Hypertension Mother    Breast cancer Mother    Heart attack Mother    Lung cancer Father    Heart disease Sister    Diabetes Sister    Colon cancer Sister      Current Facility-Administered Medications:    acetaminophen (TYLENOL) tablet 650 mg, 650 mg, Oral, Q6H PRN **OR** acetaminophen (TYLENOL) suppository 650 mg, 650 mg, Rectal, Q6H PRN, Verdene Lennert, MD   amiodarone (PACERONE) tablet 200 mg, 200 mg, Oral, BID, Verdene Lennert, MD, 200 mg at 06/05/23 0836   apixaban (ELIQUIS) tablet 5 mg, 5 mg, Oral, BID, Verdene Lennert, MD, 5 mg at 06/05/23 1610   ascorbic acid  (VITAMIN C) tablet 1,000 mg, 1,000 mg, Oral, Daily, Verdene Lennert, MD, 1,000 mg at 06/05/23 0836   aspirin EC tablet 81 mg, 81 mg, Oral, Daily, Verdene Lennert, MD, 81 mg at 06/05/23 0835   azithromycin (ZITHROMAX) 500 mg in sodium chloride 0.9 % 250 mL IVPB, 500 mg, Intravenous, Q24H, [place], [facility]  cefTRIAXone (ROCEPHIN) 1 g in sodium chloride 0.9 % 100 mL IVPB, 1 g, Intravenous, Q24H, [place], RPH   chlorpheniramine-HYDROcodone (TUSSIONEX) 10-8 MG/5ML suspension 5 mL, 5 mL, Oral, Q12H PRN, Verdene Lennert, MD   cholecalciferol (VITAMIN D3) 25 MCG (1000 UNIT) tablet 1,000 Units, 1,000 Units, Oral, Daily, Verdene Lennert, MD, 1,000 Units at 06/05/23 0835   insulin aspart (novoLOG) injection 0-9 Units, 0-9 Units, Subcutaneous, TID WC, Verdene Lennert, MD, 2 Units at 06/05/23 0837   ipratropium-albuterol (DUONEB) 0.5-2.5 (3) MG/3ML nebulizer solution 3 mL, 3 mL, Nebulization, Q6H, Verdene Lennert, MD, 3 mL at 06/05/23 0829   magnesium oxide (MAG-OX) tablet 400 mg, 400 mg, Oral, Daily, Verdene Lennert, MD, 400 mg at 06/05/23 0836   megestrol (MEGACE) tablet 80 mg, 80 mg, Oral, BID, Verdene Lennert, MD, 80 mg at 06/05/23 0925   metoprolol tartrate (LOPRESSOR) tablet 50 mg, 50 mg, Oral, BID, Verdene Lennert, MD, 50 mg at 06/05/23 0834   mirabegron ER (MYRBETRIQ) tablet 50 mg, 50 mg, Oral, Daily, Verdene Lennert, MD, 50 mg at 06/05/23 1610   nystatin (MYCOSTATIN) 100000 UNIT/ML suspension 500,000 Units, 5 mL, Oral, QID, Verdene Lennert, MD, 500,000 Units at 06/05/23 0839   ondansetron (ZOFRAN) tablet 4 mg, 4 mg, Oral, Q6H PRN **OR** ondansetron (ZOFRAN) injection 4 mg, 4 mg, Intravenous, Q6H PRN, Verdene Lennert, MD   polyethylene glycol (MIRALAX / GLYCOLAX) packet 17 g, 17 g, Oral, Daily PRN, Verdene Lennert, MD   pravastatin (PRAVACHOL) tablet 20 mg, 20 mg, Oral, QHS, Verdene Lennert, MD, 20 mg at 06/04/23 2118   [COMPLETED] methylPREDNISolone sodium succinate (SOLU-MEDROL) 125 mg/2  mL injection 125 mg, 125 mg, Intravenous, Once, 125 mg at 06/04/23 2118 **FOLLOWED BY** [START ON 06/26/2023] predniSONE (DELTASONE) tablet 40 mg, 40 mg, Oral, Q breakfast, Verdene Lennert, MD   sodium chloride flush (NS) 0.9 % injection 10 mL, 10 mL, Intravenous, Q12H, Janith Lima, MD, 10 mL at 06/05/23 0929   sodium chloride flush (NS) 0.9 % injection 3 mL, 3 mL, Intravenous, Q12H, Verdene Lennert, MD, 3 mL at 06/05/23 0929   tamsulosin (FLOMAX) capsule 0.4 mg, 0.4 mg, Oral, BID, Verdene Lennert, MD, 0.4 mg at 06/05/23 9604  Current Outpatient Medications:    acetaminophen (TYLENOL) 500 MG tablet, Take 500 mg by mouth every 4 (four) hours as needed for mild pain (pain score 1-3), moderate pain (pain score 4-6), fever or headache., Disp: , Rfl:    albuterol (VENTOLIN HFA) 108 (90 Base) MCG/ACT inhaler, Inhale 2 puffs into the lungs every 4 (four) hours as needed., Disp: , Rfl:    amiodarone (PACERONE) 200 MG tablet, One tablet twice a day for two weeks then one tablet daily after that, Disp: 54 tablet, Rfl: 0   amLODipine (NORVASC) 10 MG tablet, Take 1 tablet (10 mg total) by mouth daily., Disp: 30 tablet, Rfl: 0   apixaban (ELIQUIS) 5 MG TABS tablet, Take 5 mg by mouth 2 (two) times daily., Disp: , Rfl:    ascorbic acid (VITAMIN C) 1000 MG tablet, Take 1,000 mg by mouth daily., Disp: , Rfl:    aspirin 81 MG tablet, Take 81 mg by mouth daily., Disp: , Rfl:    cetirizine (ZYRTEC) 10 MG tablet, Take 1 tablet by mouth daily as needed for rhinitis or allergies., Disp: , Rfl:    cholecalciferol (VITAMIN D3) 25 MCG (1000 UNIT) tablet, Take 1,000 Units by mouth daily., Disp: , Rfl:    fluticasone (FLONASE) 50 MCG/ACT nasal spray, Place 1 spray into both  nostrils daily as needed for rhinitis or allergies., Disp: , Rfl:    gabapentin (NEURONTIN) 300 MG capsule, Take 300 mg by mouth at bedtime as needed. , Disp: , Rfl:    guaiFENesin-codeine (ROBITUSSIN AC) 100-10 MG/5ML syrup, Take 5 mLs by mouth 3  (three) times daily as needed for cough., Disp: 473 mL, Rfl: 0   ipratropium-albuterol (DUONEB) 0.5-2.5 (3) MG/3ML SOLN, Take 3 mLs by nebulization every 6 (six) hours as needed., Disp: , Rfl:    lisinopril (PRINIVIL,ZESTRIL) 40 MG tablet, Take 40 mg by mouth every morning., Disp: , Rfl:    magnesium oxide (MAG-OX) 400 (240 Mg) MG tablet, Take 400 mg by mouth daily., Disp: , Rfl:    megestrol (MEGACE) 40 MG tablet, Take 2 tablets (80 mg total) by mouth 2 (two) times daily., Disp: 120 tablet, Rfl: 1   metoprolol tartrate (LOPRESSOR) 50 MG tablet, Take 50 mg by mouth 2 (two) times daily., Disp: , Rfl:    mirabegron ER (MYRBETRIQ) 50 MG TB24 tablet, Take 1 tablet (50 mg total) by mouth daily., Disp: 30 tablet, Rfl: 11   nystatin (MYCOSTATIN) 100000 UNIT/ML suspension, Take 5 mLs (500,000 Units total) by mouth 4 (four) times daily. Swish and swallow., Disp: 473 mL, Rfl: 2   potassium chloride (KLOR-CON) 10 MEQ tablet, Take 1 tablet (10 mEq total) by mouth daily., Disp: 90 tablet, Rfl: 1   pravastatin (PRAVACHOL) 20 MG tablet, Take 20 mg by mouth at bedtime., Disp: , Rfl:    prochlorperazine (COMPAZINE) 10 MG tablet, Take 1 tablet (10 mg total) by mouth every 6 (six) hours as needed for nausea or vomiting., Disp: 90 tablet, Rfl: 3   senna-docusate (SENOKOT-S) 8.6-50 MG tablet, Take 2 tablets by mouth daily. (Patient taking differently: Take 2 tablets by mouth daily as needed for moderate constipation or mild constipation.), Disp: 60 tablet, Rfl: 2   spironolactone (ALDACTONE) 25 MG tablet, Take 25 mg by mouth every other day., Disp: , Rfl:    tamsulosin (FLOMAX) 0.4 MG CAPS capsule, Take 0.4 mg by mouth 2 (two) times daily., Disp: , Rfl:   Facility-Administered Medications Ordered in Other Encounters:    sodium chloride flush (NS) 0.9 % injection 10 mL, 10 mL, Intravenous, PRN, [name], MD, 10 mL at 03/18/18 0858  Review of Systems  Constitutional:  Positive for appetite change, fatigue and unexpected  weight change. Negative for chills and fever.  [name]:   Negative for hearing loss and voice change.   Eyes:  Negative for eye problems and icterus.  Respiratory:  Positive for cough and shortness of breath. Negative for chest tightness.   Cardiovascular:  Negative for chest pain and leg swelling.  Gastrointestinal:  Negative for abdominal distention and abdominal pain.  Endocrine: Negative for hot flashes.  Genitourinary:  Negative for difficulty urinating, dysuria and frequency.   Musculoskeletal:  Negative for arthralgias.  Skin:  Negative for itching and rash.  Neurological:  Negative for light-headedness and numbness.  [name]:  Negative for adenopathy. Does not bruise/bleed easily.  Psychiatric/Behavioral:  Negative for confusion.     PHYSICAL EXAM Vitals:   06/05/23 0730 06/05/23 0800 06/05/23 0830 06/05/23 1200  BP: (!) 146/77 (!) 157/78  136/77  Pulse: 89 (!) 110 93 91  Resp: (!) 26 (!) 27 (!) 23 18  Temp:   97.6 F (36.4 C)   TempSrc:   Oral   SpO2: 94% 95% 93% 92%  Weight:      Height:  Physical Exam Constitutional:      General: He is not in acute distress.    Appearance: He is not diaphoretic.  [name]:     Head: Normocephalic and atraumatic.  Eyes:     General: No scleral icterus. Cardiovascular:     Rate and Rhythm: Normal rate.  Pulmonary:     Effort: Pulmonary effort is normal. No respiratory distress.     Breath sounds: No wheezing.     Comments: Decreased breath sounds.  Pleurx  Abdominal:     General: Bowel sounds are normal. There is no distension.     Palpations: Abdomen is soft.  Musculoskeletal:        General: Normal range of motion.     Cervical back: Normal range of motion and neck supple.  Skin:    General: Skin is warm and dry.     Findings: No erythema.  Neurological:     Mental Status: He is alert and oriented to person, place, and time. Mental status is at baseline.     Cranial Nerves: No cranial nerve deficit.     Motor: No  abnormal muscle tone.  Psychiatric:        Mood and Affect: Mood and affect normal.       LABORATORY STUDIES    Latest Ref Rng & Units 06/05/2023    5:10 AM 06/04/2023    2:47 PM 05/28/2023    7:58 AM  CBC  WBC 4.0 - 10.5 K/uL 10.9  14.2  9.9   Hemoglobin 13.0 - 17.0 g/dL 9.5  16.1  09.6   Hematocrit 39.0 - 52.0 % 29.1  32.4  31.5   Platelets 150 - 400 K/uL 214  241  257       Latest Ref Rng & Units 06/05/2023    5:10 AM 06/04/2023    2:47 PM 05/28/2023    7:58 AM  CMP  Glucose 70 - 99 mg/dL 045  409  811   BUN 8 - 23 mg/dL 11  15  10    Creatinine 0.61 - 1.24 mg/dL 9.14  7.82  9.56   Sodium 135 - 145 mmol/L 135  133  135   Potassium 3.5 - 5.1 mmol/L 3.4  4.0  3.5   Chloride 98 - 111 mmol/L 100  97  99   CO2 22 - 32 mmol/L 24  25  26    Calcium 8.9 - 10.3 mg/dL 8.4  9.1  8.8   Total Protein 6.5 - 8.1 g/dL 6.5  6.9  6.6   Total Bilirubin <1.2 mg/dL 0.6  0.5  0.5   Alkaline Phos 38 - 126 U/L 62  68  51   AST 15 - 41 U/L 18  22  18    ALT 0 - 44 U/L 19  19  15       RADIOGRAPHIC STUDIES: I have personally reviewed the radiological images as listed and agreed with the findings in the report. DG Ankle Complete Left  Result Date: 06/04/2023 CLINICAL DATA:  Status post fall 2 days ago with left ankle pain and swelling EXAM: LEFT ANKLE COMPLETE - 3 VIEW COMPARISON:  None Available. FINDINGS: There are no findings of fracture or dislocation. No joint effusion. Subtle sclerosis in the distal fibular diaphysis. Ankle mortise is intact. Soft tissues are unremarkable. IMPRESSION: 1. No acute fracture or dislocation. 2. Subtle sclerosis in the distal fibular diaphysis may reflect sequela of remote injury. Electronically Signed   By: Agustin Cree M.D.   On:  06/04/2023 20:35   CT Angio Chest PE W and/or Wo Contrast  Result Date: 06/04/2023 CLINICAL DATA:  Lung carcinoma, shortness of breath EXAM: CT ANGIOGRAPHY CHEST WITH CONTRAST TECHNIQUE: Multidetector CT imaging of the chest was performed  using the standard protocol during bolus administration of intravenous contrast. Multiplanar CT image reconstructions and MIPs were obtained to evaluate the vascular anatomy. RADIATION DOSE REDUCTION: This exam was performed according to the departmental dose-optimization program which includes automated exposure control, adjustment of the mA and/or kV according to patient size and/or use of iterative reconstruction technique. CONTRAST:  75mL OMNIPAQUE IOHEXOL 350 MG/ML SOLN COMPARISON:  05/08/2023 FINDINGS: Cardiovascular: Right IJ port catheter loops in the IJ tip in the right brachiocephalic vein as before. SVC patent. Heart size normal. Small pericardial effusion. Satisfactory opacification of pulmonary arteries noted, and there is no evidence of pulmonary emboli. Scattered left coronary calcifications. Adequate contrast opacification of the thoracic aorta with no evidence of dissection, aneurysm, or stenosis. There is scattered plaque in the arch and descending thoracic aorta. Classic 3-vessel brachiocephalic arch anatomy without proximal stenosis. Mediastinum/Nodes: Right perihilar consolidation extending around the distal right mainstem bronchus with mild narrowing. Anterior pericardial lymph nodes measure up to 1.1 cm short axis diameter, stable. Stable 8 mm AP window node. New 1.1 cm left hilar adenopathy. New discontinuity of the mainstem bronchus along its distal right aspect just above the carina. Contained extraluminal gas medial to the consolidated right upper lung. Lungs/Pleura: Tunneled right pleural drain catheter at the lung base with relatively stable loculated basilar effusion. Increasing airspace consolidation in apical and posterior segments right upper lung. Pulmonary emphysema. Stable 2.7 cm pleural based consolidation in the superior segment left lower lobe. Upper Abdomen: No adrenal mass.  No acute findings. Musculoskeletal: Vertebral endplate spurring at multiple levels in the lower  thoracic spine. Stable sclerosis in the T10 vertebral body. Review of the MIP images confirms the above findings. IMPRESSION: 1. Negative for acute PE or thoracic aortic dissection. 2. Increasing airspace consolidation in the right upper lung with new discontinuity of the distal right mainstem bronchus and contained extraluminal gas medial to the consolidated right upper lung. 3. Stable loculated right pleural effusion with tunneled pleural drain catheter in place. 4. New 1.1 cm left hilar adenopathy. 5. Aortic Atherosclerosis (ICD10-I70.0) and Emphysema (ICD10-J43.9). Electronically Signed   By: Corlis Leak M.D.   On: 06/04/2023 18:44   DG Chest 2 View  Result Date: 06/04/2023 CLINICAL DATA:  Weakness. Chest pain. 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 EXAM: CHEST - 2 VIEW COMPARISON:  Chest radiograph dated May 26, 2023. FINDINGS: Right internal jugular Port-A-Cath with tip projecting over the proximal SVC. Similar position of a tunneled pleural drainage catheter at the right lung base. The loculated right pleural effusion is not significantly changed in volume compared to the prior exam. Similar right apical opacification. Slightly increased airspace opacities in the right upper lung. No focal consolidation in the left lung. Visualized osseous structures are unchanged. IMPRESSION: 1. Similar loculated right pleural effusion with tunneled pleural drainage catheter at the right lung base. 2. Mildly increased airspace opacities in the right upper lung. 3. Similar right apical opacification. Electronically Signed   By: Hart Robinsons M.D.   On: 06/04/2023 15:48   DG Chest 2 View  Result Date: 05/26/2023 CLINICAL DATA:  CHF. 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 EXAM: CHEST - 2 VIEW COMPARISON:  05/25/23 FINDINGS: There is a right IJ porta  catheter with tip in the projection of the proximal SVC. The tunneled pleural drainage  catheter is again noted overlying the right lung base. The loculated right pleural effusion appears mildly increased in volume compared with the prior examination. Unchanged right apical opacification. Airspace opacities within the right upper lung is again noted in appears increased from the previous exam. Left lung is clear. Osseous structures appear intact. IMPRESSION: 1. Mild increase in volume of loculated right pleural effusion with tunneled pleural drainage catheter in place. 2. Increased airspace opacities within the right upper lung. 3. Unchanged right apical opacification. Electronically Signed   By: Signa Kell M.D.   On: 05/26/2023 07:59   DG Chest Portable 1 View  Result Date: 05/25/2023 CLINICAL DATA:  Shortness of breath.  Lung cancer. EXAM: PORTABLE CHEST 1 VIEW COMPARISON:  Chest CT 05/08/2023.  Chest x-ray 03/08/2023. FINDINGS: Similar volume loss right hemithorax. Airspace disease noted right upper lung compatible with the consolidative disease seen on chest CT. Asymmetric elevation right hemidiaphragm. Small right pleural effusion with pleural drain identified at the right lung base. Opacity in the right apex compatible with loculated fluid and lung collapse as seen on the previous CT scan. Right Port-A-Cath again noted with tip position probably in the low right innominate vein. Left lung clear. Cardiopericardial silhouette is at upper limits of normal for size. Telemetry leads overlie the chest. IMPRESSION: 1. Similar volume loss right hemithorax with airspace disease right upper lung compatible with the consolidative disease seen on chest CT. 2. Small right pleural effusion with pleural drain at the right lung base. 3. Opacity in the right apex compatible with loculated fluid and lung collapse as seen on the previous CT scan. Electronically Signed   By: Kennith Center M.D.   On: 05/25/2023 14:22   CT HEAD WO CONTRAST ( )  Result Date: 05/21/2023 CLINICAL DATA:  Status post fall.  Dizziness. On blood thinners. History of lung cancer. EXAM: CT HEAD WITHOUT CONTRAST TECHNIQUE: Contiguous axial images were obtained from the base of the skull through the vertex without intravenous contrast. RADIATION DOSE REDUCTION: This exam was performed according to the departmental dose-optimization program which includes automated exposure control, adjustment of the mA and/or kV according to patient size and/or use of iterative reconstruction technique. COMPARISON:  Head MRI 12/05/2022 FINDINGS: Brain: There is no evidence of an acute infarct, intracranial hemorrhage, mass, midline shift, or extra-axial fluid collection. Slight cerebral atrophy is within normal limits for age. 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 Cerebral white matter hypodensities are nonspecific but compatible with minimal chronic small vessel ischemic disease. Vascular: No hyperdense vessel. Skull: No acute fracture or suspicious osseous lesion. Sinuses/Orbits: Minimal fluid in the left sphenoid sinus. Clear mastoid air cells. Left cataract extraction. Other: None. IMPRESSION: No evidence of acute intracranial abnormality. Electronically Signed   By: Sebastian Ache M.D.   On: 05/21/2023 13:18   CT CHEST ABDOMEN PELVIS W CONTRAST  Result Date: 05/10/2023 CLINICAL DATA:  Right lung cancer restaging * Tracking Code: BO * EXAM: CT CHEST, ABDOMEN, AND PELVIS WITH CONTRAST TECHNIQUE: Multidetector CT imaging of the chest, abdomen and pelvis was performed following the standard protocol during bolus administration of intravenous contrast. RADIATION DOSE REDUCTION: This exam was performed according to the departmental dose-optimization program which includes automated exposure control, adjustment of the mA and/or kV according to patient size and/or use of iterative reconstruction technique. CONTRAST:  80mL OMNIPAQUE IOHEXOL 300 MG/ML  SOLN COMPARISON:  02/18/2023 (chest) and 11/16/2022 (  abdomen/pelvis). FINDINGS: CT  CHEST FINDINGS Cardiovascular: The Port-A-Cath catheter appears to loop up into the right internal jugular vein and extends caudad to terminate in the right brachiocephalic vein, similar to prior exams including 02/01/2023. Atheromatous vascular calcification of the thoracic aorta. Stable small pericardial effusion. Mediastinum/Nodes: Persistent hazy density around the right mainstem bronchus, without a discretely measurable lymph node or lesion in this vicinity. Lymph nodes just outside of the pericardium in the mediastinal adipose tissue measure up to 1.4 cm in short axis on image 47 series 2, previously 0.9 cm. There is some high density nodularity along the right anterior pericardium for example on image 46 series 2, possibly from pericardial lesion or adjacent lymph node. Previously hypermetabolic AP window lymph node 0.6 cm in short axis on image 28 series 2, formerly 0.8 cm on 02/18/2023. Lungs/Pleura: Collapsed and partially consolidated right upper lobe, hypodense medial portion measures about 1.7 cm in thickness on image 25 series 2, formerly about 2.2 cm in thickness. This hypodense portion the may represent original tumor but is indistinctly marginated against the atelectatic lung. The right upper lobe bronchus is truncated. Reduced size right pleural effusion with a right pleural drainage catheter in place. Currently the effusion is small to moderate. Notable enhancement along the parietal pleura. Trace gas along the pleural space at the lung base. Emphysema. Previously hypermetabolic posterior consolidation in the superior segment right lower lobe and posteriorly in the right middle lobe, this along was previously atelectatic. Current appearance could be from pneumonia or aspiration pneumonitis, versus prior radiation therapy. Scattered pleural nodularity anteriorly along the right middle lobe, small pleural deposits of tumor not excluded. Subpleural nodularity in the right lower lobe observed, index  lesion 5 mm in diameter on image 117 series 4. Stable left apical pleuroparenchymal scarring. A 3.0 by 2.0 cm masslike consolidation in the superior segment left lower lobe previously measured 2.7 by 1.9 cm by my measurements. Accordingly this is mildly enlarged. Musculoskeletal: Lower thoracic spondylosis. Probable sebaceous cyst or similar benign lesion along the junction of the right posterior neck and chest, 1.2 cm in short axis on image 1 series 2, previously not hypermetabolic. 0.9 cm sclerotic left eleventh rib lesion medially on image 57 series 2, not previously hypermetabolic. Probably a bone island. CT ABDOMEN PELVIS FINDINGS Hepatobiliary: Contracted gallbladder.  Otherwise unremarkable. Pancreas: Unremarkable Spleen: Unremarkable Adrenals/Urinary Tract: Fullness of both adrenal glands without a discrete mass. The kidneys appear unremarkable. Stomach/Bowel: Scattered colonic diverticula. Vascular/Lymphatic: Small right gastric node 0.7 cm in short axis on image 65 series 2 Atherosclerosis is present, including aortoiliac atherosclerotic disease. Reproductive: Prominent median lobe of the prostate gland indents the bladder base. Other: Unremarkable Musculoskeletal: Left-sided lipoma interposed between the gluteus maximus and the greater trochanter. Hemangioma eccentric to the left in the L2 vertebral body, image 78 series 2. Probable bone island in the right iliac bone. Chronic avascular necrosis of both femoral heads. Degenerative endplate sclerosis inferiorly along the L3 endplate with a sclerotic lesion eccentric to the left in the L3 vertebral body which is nonspecific but not previously hypermetabolic. Probable superficial sebaceous cyst along the right upper thigh/groin region, 1.2 cm in short axis on image 132 series 2, present and not previously hypermetabolic PET-CT 12/03/2022. IMPRESSION: 1. Reduced size of the large right upper lobe central tumor, currently 1.7 cm in transverse thickness and  previously 2.2 cm. 2. Reduced size right pleural effusion with a right pleural drainage catheter in place. Currently the effusion is small to moderate. Enhancing right  pleural metastatic foci. Trace gas along the pleural space at the lung base. 3. Airspace opacity in the superior segment right lower lobe and posteriorly in the right middle lobe, this along was previously atelectatic. Pneumonia not excluded. 4. Subpleural nodularity in the right lower lobe and right middle lobe observed, index lesion 5 mm in diameter. 5. Mild enlargement of a masslike consolidation in the superior segment left lower lobe, currently 3.0 by 2.0 cm, previously 2.7 by 1.9 cm. 6. Persistent hazy density around the right mainstem bronchus, without a discretely measurable lymph node or lesion in this vicinity. 7. Lymph nodes just outside of the pericardium in the mediastinal adipose tissue measure up to 1.4 cm in short axis, previously 0.9 cm. There is some high density nodularity along the right anterior pericardium for example, possibly from pericardial lesion or adjacent lymph node. 8. Stable small pericardial effusion. 9. Emphysema. 10. Chronic avascular necrosis of both femoral heads. 11. Prominent median lobe of the prostate gland indents the bladder base. 12. Scattered colonic diverticula. 13. Aortic atherosclerosis. Aortic Atherosclerosis (ICD10-I70.0) and Emphysema (ICD10-J43.9). Electronically Signed   By: Gaylyn Rong M.D.   On: 05/10/2023 08:47   IR Guided Horace Porteous W Catheter Placement  Result Date: 03/21/2023 INDICATION: 72 year old male with a history of malignancy referred for right-sided pleural PleurX EXAM: IMAGE GUIDED TUNNELED PLEURAL DRAINAGE CATHETER MEDICATIONS: 1 g vancomycin the antibiotics were administered within an appropriate time frame prior to the initiation of the procedure. ANESTHESIA/SEDATION: Moderate (conscious) sedation was employed during this procedure. A total of Versed 1.0 mg and Fentanyl 50  mcg was administered intravenously by the radiology nurse. Total intra-service moderate Sedation Time: 25 minutes. The patient's level of consciousness and vital signs were monitored continuously by radiology nursing throughout the procedure under my direct supervision. COMPLICATIONS: None PROCEDURE: The procedure, risks, benefits, and alternatives were explained to the patient and the patient's family. Specific risks that were addressed included bleeding, infection, pneumothorax, need for further procedure, chance of delayed pneumothorax or hemorrhage, hemoptysis, cardiopulmonary collapse, death. Questions regarding the procedure were encouraged and answered. The patient understands and consents to the procedure. The right chest wall was prepped with Betadine in a sterile fashion, and a sterile drape was applied covering the operative field. A sterile gown and sterile gloves were used for the procedure. Local anesthesia was provided with 1% Lidocaine. Ultrasound image documentation was performed. After creating a small skin incision, a 19 gauge needle was advanced into the pleural cavity under ultrasound guidance. A guide wire was then advanced under fluoroscopy into the pleural space. Pleural access was dilated serially and a 16-French peel-away sheath placed. The skin and subcutaneous tissues were generously infiltrated with 1% lidocaine from the puncture site over the pleura along the intercostal margin anteriorly. A small stab incision was made with 11 blade scalpel at the insertion site of the catheter, and the catheter was back tunneled to the site at the pleural puncture. A tunneled care fusion catheter was placed. This was tunneled from the incision 5 cm anterior to the pleural access to the access site. The catheter was advanced through the peel-away sheath. The sheath was then removed. Final catheter positioning was confirmed with a fluoroscopic spot image. The access incision was closed with Dermabond.  Dermabond was applied to the catheterization incision. Large volume thoracentesis was performed through the new catheter utilizing gravity drainage bag. The patient tolerated the procedure well and remained hemodynamically stable throughout. No complications were encountered and no significant blood loss was  encountered. IMPRESSION: Status post image guided right pleural tunneled drainage catheter. Signed, Yvone Neu. Miachel Roux, RPVI [facility] Electronically Signed   By: Gilmer Mor D.O.   On: 03/21/2023 14:39   US THORACENTESIS ASP PLEURAL SPACE W/IMG GUIDE  Result Date: 03/08/2023 INDICATION: Recurrent right pleural effusion. Request received for therapeutic thoracentesis EXAM: ULTRASOUND GUIDED RIGHT THORACENTESIS MEDICATIONS: 7 cc 1% lidocaine COMPLICATIONS: None immediate. PROCEDURE: An ultrasound guided thoracentesis was thoroughly discussed with the patient and questions answered. The benefits, risks, alternatives and complications were also discussed. The patient understands and wishes to proceed with the procedure. Written consent was obtained. Ultrasound was performed to localize and mark an adequate pocket of fluid in the right chest. The area was then prepped and draped in the normal sterile fashion. 1% Lidocaine was used for local anesthesia. Under ultrasound guidance a 6 Fr Safe-T-Centesis catheter was introduced. Thoracentesis was performed. The catheter was removed and a dressing applied. FINDINGS: A total of approximately 1.95 L of serosanguineous fluid was removed. Ordering provider did not request laboratory samples. IMPRESSION: Successful ultrasound guided right thoracentesis yielding 1.95 L of pleural fluid. Follow-up chest x-ray revealed no evidence of pneumothorax. Procedure performed by Mina Marble, PA-C Electronically Signed   By: Olive Bass M.D.   On: 03/08/2023 15:17   DG Chest Port 1 View  Result Date:  03/08/2023 CLINICAL DATA:  post right thoracentesis EXAM: PORTABLE CHEST 1 VIEW COMPARISON:  Multiple priors FINDINGS: Stable appearance of the cardiomediastinal silhouette with chronic atelectasis of the right upper lobe. Interval improvement in right effusion with residual small to moderate fluid. No pneumothorax. Left lung remains clear. Similar malpositioning of the right-sided chest port with redundant catheter in the right neck and tip of the catheter terminating in the proximal SVC. IMPRESSION: 1. No findings of pneumothorax following right-sided thoracentesis, with small to moderate residual effusion remaining. 2. Similar malpositioning of the right-sided port with redundant catheter in the right neck and tip of the catheter terminating in the proximal SVC. 3. Other chronic findings as detailed. Electronically Signed   By: Olive Bass M.D.   On: 03/08/2023 15:12     Assessment and plan-    # Obstructive pneumonia Agree with IV antibiotics-Rocephin and azithromycin.  # Acute on chronic respiratory failure Secondary to obstructive pneumonia. Continue supplemental oxygen to maintain oxygen saturation above 88%.  Wean as tolerated.  # COPD exacerbation, agree with steroids, bronchodilators.  # Stage IV lung adenocarcinoma, Currently chemotherapy has been on hold. Recent restaging CT showed treatment response. Consult palliative care service Recommend DNR/DNI Continue current scope of care  # Pleural effusion, patient has pleural catheter placement  During fluid as needed  # Weight loss, malnutrition Continue Megace 80 mg twice daily. Nutrition supplements  # Thrush, pre-existing nystatin mouthwash swish and swallow  CODE STATUS DNR DVT prophylaxis patient is on Eliquis Thank you for allowing me to participate in the care of this patient.   [name], MD, PhD Hematology Oncology 06/05/2023

## 2023-06-05 NOTE — TOC Initial Note (Addendum)
Transition of Care [facility]) - Initial/Assessment Note    Patient Details  Name: Mark Mccarty MRN: [id number] Date of Birth: April 13, 1951  [facility] CM/SW Contact:    Marquita Palms, LCSW Phone Number: 06/05/2023, 1:02 PM  Clinical Narrative:                  Health risk assessment completed by CSW. CSW spoke with patient briefly at bedside. MD in to speak with patient. Patient needed to be changed. Patient reports he lives with his son and agreeable to [facility] if offered by MD. Patient reports that [facility] in [place]. Patient reports that he will schedule an appointment with primary.care provider. Patient reports that he would like a shower but unstable on his feet. No other needs at this time.       Patient Goals and CMS Choice            Expected Discharge Plan and Services                                              Prior Living Arrangements/Services                       Activities of Daily Living      Permission Sought/Granted                  Emotional Assessment              Admission diagnosis:  Sepsis [facility]) [A41.9] Patient Active Problem List   Diagnosis Date Noted   Sepsis (HCC) 06/04/2023   Postobstructive pneumonia 06/04/2023   Acute hypoxic respiratory failure (HCC) 06/04/2023   COPD with acute exacerbation (HCC) 06/04/2023   Hypotension 05/28/2023   Thrush 05/28/2023   Hypomagnesemia 05/26/2023   Abdominal pain 05/26/2023   Near syncope 05/25/2023   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   Drug-induced neutropenia (HCC) 03/18/2023   Atrial flutter with rapid ventricular response (HCC) 01/10/2023   Encounter for antineoplastic chemotherapy 01/01/2023   Weight loss 01/01/2023   Pleural effusion 12/17/2022   Anxiety associated with cancer diagnosis (HCC) 11/21/2022   Vitamin B12 deficiency 09/17/2022   OAB (overactive bladder) 09/22/2021   Port-A-Cath in place 03/15/2020   Liver lesion  03/15/2020   DDD (degenerative disc disease), lumbar 08/04/2019   Obesity (BMI 30.0-34.9) 08/04/2019   Umbilical hernia without obstruction and without gangrene 04/07/2019   Centrilobular emphysema (HCC) 03/18/2019   Peripheral polyneuropathy 03/18/2019   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   H/O adenomatous polyp of colon 01/20/2019   Avascular necrosis of femoral head (HCC) 12/10/2018   [name] 09/18/2018   Elevated PSA, less than 10 ng/ml 03/22/2018   BPH (benign prostatic hyperplasia) 03/19/2018   Benign prostatic hyperplasia with nocturia 03/19/2018   Hypokalemia 01/07/2018   Goals of care, counseling/discussion 12/13/2017   Primary lung adenocarcinoma, right (HCC) 12/13/2017   SOB (shortness of breath) on exertion 10/02/2017   Incomplete immunization status 09/14/2017   Pedal edema 12/04/2016   Hyperuricemia 05/02/2016   Type 2 diabetes mellitus with other specified complication (HCC) 05/02/2016   High risk medication use 09/12/2015   S/P cardiac catheterization 12/23/2014   Chest pain with high risk for cardiac etiology 12/16/2014   Abnormal nuclear stress test 12/16/2014   Adenomatous polyp of colon 04/02/2014   Dysphagia 04/02/2014  Family history of colon cancer 03/06/2014   Gastroesophageal reflux 03/06/2014   Hyperlipidemia, unspecified 03/06/2014   Essential hypertension 03/06/2014   Aortic insufficiency 01/20/2014   Erectile dysfunction 01/20/2014   Former smoker 01/20/2014   Irregular heart beat 01/20/2014   Left ventricular diastolic dysfunction 01/20/2014   Peptic ulcer disease 01/20/2014   PCP:  [name], NP Pharmacy:   [phone] [place], [place] - [address] [place] [address] Phone: [phone] Fax: [phone]  [facility]) - [place], [place] - [address] [facility] [address] [place] [address] [place] [address] Phone: [phone] Fax:  [phone]  [id number] - [place], [place] - [address] [place] [address] Phone: [phone] Fax: [phone]     Social Determinants of Health (SDOH) Social History: SDOH Screenings   Social Connections: Unknown (11/14/2021)   Received from [facility]  Tobacco Use: Medium Risk (06/04/2023)   SDOH Interventions:     Readmission Risk Interventions    06/05/2023    1:01 PM  Readmission Risk Prevention Plan  Transportation Screening Complete  Medication Review (RN Care Manager) Complete  HRI or Home Care Consult Complete  SW Recovery Care/Counseling Consult Complete  Palliative Care Screening Complete  Skilled Nursing Facility Complete

## 2023-06-05 NOTE — ED Notes (Signed)
Pt requested and given sandwich tray

## 2023-06-05 NOTE — ED Notes (Signed)
Pt. Sitting up in bed, eating dinner independently, no assistance.

## 2023-06-06 ENCOUNTER — Inpatient Hospital Stay: Payer: Medicare Other

## 2023-06-06 ENCOUNTER — Other Ambulatory Visit: Payer: Self-pay | Admitting: Oncology

## 2023-06-06 DIAGNOSIS — A419 Sepsis, unspecified organism: Secondary | ICD-10-CM | POA: Diagnosis not present

## 2023-06-06 DIAGNOSIS — J9601 Acute respiratory failure with hypoxia: Secondary | ICD-10-CM | POA: Diagnosis not present

## 2023-06-06 DIAGNOSIS — J189 Pneumonia, unspecified organism: Secondary | ICD-10-CM | POA: Diagnosis not present

## 2023-06-06 DIAGNOSIS — I4891 Unspecified atrial fibrillation: Secondary | ICD-10-CM | POA: Diagnosis not present

## 2023-06-06 DIAGNOSIS — J441 Chronic obstructive pulmonary disease with (acute) exacerbation: Secondary | ICD-10-CM | POA: Diagnosis not present

## 2023-06-06 LAB — GLUCOSE, CAPILLARY
Glucose-Capillary: 125 mg/dL — ABNORMAL HIGH (ref 70–99)
Glucose-Capillary: 156 mg/dL — ABNORMAL HIGH (ref 70–99)
Glucose-Capillary: 171 mg/dL — ABNORMAL HIGH (ref 70–99)

## 2023-06-06 LAB — CBC WITH DIFFERENTIAL/PLATELET
Abs Immature Granulocytes: 0.05 10*3/uL (ref 0.00–0.07)
Basophils Absolute: 0 10*3/uL (ref 0.0–0.1)
Basophils Relative: 0 %
Eosinophils Absolute: 0 10*3/uL (ref 0.0–0.5)
Eosinophils Relative: 0 %
HCT: 30.5 % — ABNORMAL LOW (ref 39.0–52.0)
Hemoglobin: 9.9 g/dL — ABNORMAL LOW (ref 13.0–17.0)
Immature Granulocytes: 0 %
Lymphocytes Relative: 1 %
Lymphs Abs: 0.2 10*3/uL — ABNORMAL LOW (ref 0.7–4.0)
MCH: 28.9 pg (ref 26.0–34.0)
MCHC: 32.5 g/dL (ref 30.0–36.0)
MCV: 89.2 fL (ref 80.0–100.0)
Monocytes Absolute: 1.3 10*3/uL — ABNORMAL HIGH (ref 0.1–1.0)
Monocytes Relative: 9 %
Neutro Abs: 12.6 10*3/uL — ABNORMAL HIGH (ref 1.7–7.7)
Neutrophils Relative %: 90 %
Platelets: 255 10*3/uL (ref 150–400)
RBC: 3.42 MIL/uL — ABNORMAL LOW (ref 4.22–5.81)
RDW: 16 % — ABNORMAL HIGH (ref 11.5–15.5)
Smear Review: NORMAL
WBC Morphology: INCREASED
WBC: 14.2 10*3/uL — ABNORMAL HIGH (ref 4.0–10.5)
nRBC: 0 % (ref 0.0–0.2)

## 2023-06-06 LAB — BLOOD GAS, ARTERIAL
Acid-Base Excess: 3.1 mmol/L — ABNORMAL HIGH (ref 0.0–2.0)
Bicarbonate: 27 mmol/L (ref 20.0–28.0)
Expiratory PAP: 8 cm[H2O]
FIO2: 70 %
Inspiratory PAP: 12 cm[H2O]
O2 Saturation: 88.6 %
Patient temperature: 37
pCO2 arterial: 38 mm[Hg] (ref 32–48)
pH, Arterial: 7.46 — ABNORMAL HIGH (ref 7.35–7.45)
pO2, Arterial: 57 mm[Hg] — ABNORMAL LOW (ref 83–108)

## 2023-06-06 LAB — COMPREHENSIVE METABOLIC PANEL
ALT: 19 U/L (ref 0–44)
AST: 21 U/L (ref 15–41)
Albumin: 1.9 g/dL — ABNORMAL LOW (ref 3.5–5.0)
Alkaline Phosphatase: 63 U/L (ref 38–126)
Anion gap: 13 (ref 5–15)
BUN: 15 mg/dL (ref 8–23)
CO2: 26 mmol/L (ref 22–32)
Calcium: 8.5 mg/dL — ABNORMAL LOW (ref 8.9–10.3)
Chloride: 97 mmol/L — ABNORMAL LOW (ref 98–111)
Creatinine, Ser: 0.61 mg/dL (ref 0.61–1.24)
GFR, Estimated: >60 mL/min (ref >60)
Glucose, Bld: 154 mg/dL — ABNORMAL HIGH (ref 70–99)
Potassium: 3.4 mmol/L — ABNORMAL LOW (ref 3.5–5.1)
Sodium: 136 mmol/L (ref 135–145)
Total Bilirubin: 0.6 mg/dL (ref <1.2)
Total Protein: 6.7 g/dL (ref 6.5–8.1)

## 2023-06-06 LAB — MAGNESIUM: Magnesium: 1.5 mg/dL — ABNORMAL LOW (ref 1.7–2.4)

## 2023-06-06 LAB — LEGIONELLA PNEUMOPHILA SEROGP 1 UR AG: L. pneumophila Serogp 1 Ur Ag: NEGATIVE

## 2023-06-06 LAB — PHOSPHORUS: Phosphorus: 4.1 mg/dL (ref 2.5–4.6)

## 2023-06-06 LAB — BRAIN NATRIURETIC PEPTIDE: B Natriuretic Peptide: 443.3 pg/mL — ABNORMAL HIGH (ref 0.0–100.0)

## 2023-06-06 MED ORDER — DEXTROSE 5 % IV SOLN
500.0000 mg | INTRAVENOUS | Status: DC
  Filled 2023-06-06: qty 5

## 2023-06-06 MED ORDER — ORAL CARE MOUTH RINSE: 15.0000 mL | OROMUCOSAL | Status: DC | PRN

## 2023-06-06 MED ORDER — MORPHINE SULFATE (PF) 2 MG/ML IV SOLN
2.0000 mg | Freq: Once | INTRAVENOUS | Status: AC
  Administered 2023-06-06: 2 mg via INTRAVENOUS

## 2023-06-06 MED ORDER — LORAZEPAM 2 MG/ML IJ SOLN
0.5000 mg | INTRAMUSCULAR | Status: DC | PRN
  Filled 2023-06-06: qty 1

## 2023-06-06 MED ORDER — MORPHINE 100MG IN NS 100ML (1MG/ML) PREMIX INFUSION
5.0000 mg/h | INTRAVENOUS | Status: DC
Start: 2023-06-06 — End: 2023-06-06
  Administered 2023-06-06: 5 mg/h via INTRAVENOUS
  Filled 2023-06-06: qty 100

## 2023-06-06 MED ORDER — MAGNESIUM SULFATE 2 GM/50ML IV SOLN
2.0000 g | Freq: Once | INTRAVENOUS | Status: AC
  Administered 2023-06-06: 2 g via INTRAVENOUS
  Filled 2023-06-06: qty 50

## 2023-06-06 MED ORDER — FUROSEMIDE 10 MG/ML IJ SOLN
20.0000 mg | Freq: Once | INTRAMUSCULAR | Status: AC
  Administered 2023-06-06: 20 mg via INTRAVENOUS
  Filled 2023-06-06: qty 2

## 2023-06-06 MED ORDER — SODIUM CHLORIDE 0.9 % IV SOLN
2.0000 g | Freq: Three times a day (TID) | INTRAVENOUS | Status: DC
  Administered 2023-06-06: 2 g via INTRAVENOUS
  Filled 2023-06-06 (×2): qty 12.5

## 2023-06-06 MED ORDER — GLYCOPYRROLATE 0.2 MG/ML IJ SOLN: 0.2000 mg | INTRAMUSCULAR | Status: DC | PRN

## 2023-06-06 MED ORDER — HALOPERIDOL LACTATE 5 MG/ML IJ SOLN: 0.5000 mg | INTRAMUSCULAR | Status: DC | PRN

## 2023-06-06 MED ORDER — POLYVINYL ALCOHOL 1.4 % OP SOLN: 1.0000 [drp] | Freq: Four times a day (QID) | OPHTHALMIC | Status: DC | PRN

## 2023-06-06 MED ORDER — MORPHINE SULFATE (PF) 2 MG/ML IV SOLN
2.0000 mg | Freq: Once | INTRAVENOUS | Status: AC
  Administered 2023-06-06: 2 mg via INTRAVENOUS
  Filled 2023-06-06: qty 1

## 2023-06-06 MED ORDER — DIPHENHYDRAMINE HCL 50 MG/ML IJ SOLN: 12.5000 mg | INTRAMUSCULAR | Status: DC | PRN

## 2023-06-06 MED ORDER — SODIUM CHLORIDE 0.9 % IV SOLN
500.0000 mg | INTRAVENOUS | Status: DC
  Filled 2023-06-06: qty 5

## 2023-06-06 MED ORDER — METHYLPREDNISOLONE SODIUM SUCC 125 MG IJ SOLR
125.0000 mg | Freq: Once | INTRAMUSCULAR | Status: AC
  Administered 2023-06-06: 125 mg via INTRAVENOUS
  Filled 2023-06-06: qty 2

## 2023-06-06 MED ORDER — LORAZEPAM 2 MG/ML IJ SOLN
1.0000 mg | INTRAMUSCULAR | Status: DC | PRN
  Administered 2023-06-06: 1 mg via INTRAVENOUS

## 2023-06-06 MED ORDER — BIOTENE DRY MOUTH MT LIQD: 15.0000 mL | OROMUCOSAL | Status: DC | PRN

## 2023-06-06 MED ORDER — MORPHINE BOLUS VIA INFUSION
2.0000 mg | INTRAVENOUS | Status: DC | PRN
Start: 2023-06-06 — End: 2023-06-06
  Administered 2023-06-06: 2 mg via INTRAVENOUS

## 2023-06-06 MED ORDER — METHYLPREDNISOLONE SODIUM SUCC 40 MG IJ SOLR: 40.0000 mg | Freq: Two times a day (BID) | INTRAMUSCULAR | Status: DC

## 2023-06-06 MED ORDER — HALOPERIDOL 0.5 MG PO TABS: 0.5000 mg | ORAL_TABLET | ORAL | Status: DC | PRN

## 2023-06-06 MED ORDER — GLYCOPYRROLATE 1 MG PO TABS: 1.0000 mg | ORAL_TABLET | ORAL | Status: DC | PRN

## 2023-06-06 MED ORDER — POTASSIUM CHLORIDE 10 MEQ/100ML IV SOLN
10.0000 meq | INTRAVENOUS | Status: AC
  Administered 2023-06-06: 10 meq via INTRAVENOUS
  Filled 2023-06-06 (×2): qty 100

## 2023-06-06 MED ORDER — FUROSEMIDE 10 MG/ML IJ SOLN
40.0000 mg | Freq: Once | INTRAMUSCULAR | Status: AC
  Administered 2023-06-06: 40 mg via INTRAVENOUS
  Filled 2023-06-06: qty 4

## 2023-06-06 MED ORDER — MORPHINE BOLUS VIA INFUSION: 2.0000 mg | INTRAVENOUS | Status: DC | PRN

## 2023-06-06 MED ORDER — LORAZEPAM 2 MG/ML IJ SOLN
0.5000 mg | INTRAMUSCULAR | Status: DC | PRN
  Administered 2023-06-06 (×2): 1 mg via INTRAVENOUS
  Filled 2023-06-06 (×2): qty 1

## 2023-06-06 MED ORDER — MORPHINE SULFATE (PF) 2 MG/ML IV SOLN
1.0000 mg | Freq: Once | INTRAVENOUS | Status: AC
  Administered 2023-06-06: 1 mg via INTRAVENOUS
  Filled 2023-06-06: qty 1

## 2023-06-06 MED ORDER — ORAL CARE MOUTH RINSE: 15.0000 mL | OROMUCOSAL | Status: DC

## 2023-06-06 MED ORDER — HALOPERIDOL LACTATE 2 MG/ML PO CONC: 0.5000 mg | ORAL | Status: DC | PRN

## 2023-06-06 MED ORDER — LORAZEPAM 1 MG PO TABS: 1.0000 mg | ORAL_TABLET | ORAL | Status: DC | PRN

## 2023-06-06 MED ORDER — MORPHINE SULFATE (PF) 2 MG/ML IV SOLN
2.0000 mg | INTRAVENOUS | Status: DC | PRN
  Administered 2023-06-06: 2 mg via INTRAVENOUS
  Filled 2023-06-06 (×2): qty 1

## 2023-06-06 MED ORDER — LORAZEPAM 2 MG/ML PO CONC: 1.0000 mg | ORAL | Status: DC | PRN

## 2023-06-09 LAB — CULTURE, BLOOD (ROUTINE X 2)
Culture: NO GROWTH
Culture: NO GROWTH
Special Requests: ADEQUATE
Special Requests: ADEQUATE

## 2023-06-13 ENCOUNTER — Other Ambulatory Visit: Payer: Medicare Other

## 2023-06-13 ENCOUNTER — Ambulatory Visit: Payer: Medicare Other

## 2023-06-13 ENCOUNTER — Ambulatory Visit: Payer: Medicare Other | Admitting: Oncology

## 2023-06-13 ENCOUNTER — Encounter: Payer: Medicare Other | Admitting: Hospice and Palliative Medicine

## 2023-06-18 ENCOUNTER — Ambulatory Visit: Payer: Medicare Other | Admitting: Oncology

## 2023-06-18 ENCOUNTER — Ambulatory Visit: Payer: Medicare Other

## 2023-06-18 ENCOUNTER — Other Ambulatory Visit: Payer: Medicare Other

## 2023-06-20 ENCOUNTER — Other Ambulatory Visit: Payer: Self-pay | Admitting: Oncology

## 2023-07-03 NOTE — Progress Notes (Signed)
Pt arrived to the unit as a rapid response from the floor, Pt Alert and responding appropriately, pt family aware patient is actively dying and moved to comfort care was immediately decided upon with hospitalist as well as palliative NP. Will continue to monitor and make comfortable per orders.

## 2023-07-03 NOTE — Progress Notes (Signed)
Notified by CCMD that patient's HR was in the 130s-140s. On assessment, patient's SpO2 at 86% on 15L HFNC and RR 36. Rapid response called and patient placed on BiPAP. Per NP Jon Billings, drain PleurX catheter and reassess respiratory status. After interventions, patient's HR now between 90s-100s, RR 30s-40s, and SpO2 92% on BiPAP. Plan of care ongoing.  Lamonte Richer, RN

## 2023-07-03 NOTE — Progress Notes (Signed)
Progress Note   Patient: Mark Mccarty:[id number] DOB: 10-09-1950 DOA: 06/04/2023     2 DOS: the patient was seen and examined on 06/02/2023   Brief hospital course: 73yo with h/o stage 4 lung cancer on palliative chemo with malignant effusion s/p Pleurx catheter, afib on Eliquis, HFpEF, DM, COPD, HTN, and HLD who presented on 12/3 with worsening weakness. He was diagnosed with sepsis due to postobstructive PNA and treated with Vanc/Cefepime -> Ceftriaxone/Azithromycin. He is having frequent 3-4 beat runs of wide complex V tach.  He also developed worsening respiratory failure overnight on 12/5 and was placed on BIPAP with broadening of antibiotics.  Assessment and Plan:  Sepsis due to postobstructive PNA Patient presenting with tachycardia, tachypnea, elevated WBC with CT imaging concerning for obstructive pneumonia Telemetry monitoring S/p Vancomycin, Cefepime and Azithromycin -> Ceftriaxone and Azithromycin -> Cefepime and Azithromycin Blood cultures NTD x 24 hours Procalcitonin 2.42 S/p 2.5 L bolus Continue maintenance fluids CT imaging with increasing airspace consolidation of the right upper lung with new discontinuity of the distal right mainstem bronchus Per chart review, patient's known stage IV cancer involved in the right upper lobe previously, however opacity in is enlarging, concerning for post obstructive pneumonia. Oncology consulted Patient with respiratory distress overnight and again this AM Having GOC conversations with family at bedside; attempted to discuss further with patient but he is too ill to effectively participate at this time Oncology and palliative care notified of worsening clinical status Transfer to SDU at this time Repeat CXR overnight unremarkable; another repeat film is ordered now   COPD with acute exacerbation  In the setting of pneumonia, with increased shortness of breath and cough. Solu-Medrol 125 mg -> prednisone -> Solumedrol DuoNebs  every 6 hours   Essential hypertension Previous history of hypertension, however it seems antihypertensives have been on hold due to hypotension. Continue to hold home antihypertensives   Primary lung adenocarcinoma, right  Patient had an original history of stage IIIa lung cancer followed by stage IV adenocarcinoma, currently on palliative chemotherapy.   Gven changes on CT imaging, patient's cancer appears to be advancing. Oncology consulted with palliative care Continue regular Pleurx drainage - overnight drainage with 50 cc   Atrial Flutter-fibrillation with runs of Vtach History of atrial fibrillation/flutter with recent cardioversion on 05/25/2023 He is having repeated runs of vtach  Continue Toprol XL, Amiodarone Consider cardiology consult   Goals of care, counseling/discussion Patient understood on admission that his cancer is noncurable and potentially advancing He is open to a palliative care consultation. Given concern for developing arrhythmia, further discussed code status and concern for disease progression Patient is now DNR/DNI Further discussed GOC with children, considering transition to comfort measures only   Type 2 diabetes mellitus with other specified complication  A1c is 6, good control Cover with sensitive-scale SSI      Consultants: Palliative care Oncology   Procedures: None   Antibiotics: Azithromycin 12/4-8 Ceftriaxone 12/4 Vancomycin x 1 Cefepime x 1, 12/5-  30 Day Unplanned Readmission Risk Score    Flowsheet Row ED to Hosp-Admission (Current) from 06/04/2023 in [facility] 1C MEDICAL TELEMETRY  30 Day Unplanned Readmission Risk Score (%) 39.09 Filed at 06/11/2023 0401       This score is the patient's risk of an unplanned readmission within 30 days of being discharged (0 -100%). The score is based on dignosis, age, lab data, medications, orders, and past utilization.   Low:  0-14.9   Medium: 15-21.9  High:  22-29.9   Extreme: 30 and above           Subjective: Respiratory distress this AM despite BIPAP, RRT called. "I want to go home."   Objective: Vitals:   06/21/2023 0507 06/22/2023 0735  BP: (!) 152/73 (!) 153/74  Pulse: (!) 113 (!) 117  Resp: (!) 45 20  Temp: 98 F (36.7 C) 98.4 F (36.9 C)  SpO2: (!) 88% 96%    Intake/Output Summary (Last 24 hours) at 06/26/2023 0754 Last data filed at 06/15/2023 1610 Gross per 24 hour  Intake 1170.72 ml  Output 1075 ml  Net 95.72 ml   Filed Weights   06/04/23 1435  Weight: 77.9 kg    Exam:  General:  Appears anxious, on BIPAP Eyes:  EOMI, normal lids, iris ENT:  grossly normal hearing, on BIPAP Neck:  no LAD, masses or thyromegaly Cardiovascular:  RR with tachycardia. No LE edema.  Respiratory:   Diffuse rhonchi.  Moderately increased respiratory effort. Abdomen:  soft, NT, ND Skin:  no rash or induration seen on limited exam Musculoskeletal:  grossly normal tone BUE/BLE, good ROM, no bony abnormality Psychiatric:  blunted mood and affect, speech limited by BIPAP Neurologic:  Unable to effectively perform  Data Reviewed: I have reviewed the patient's lab results since admission.  Pertinent labs for today include:   ABG: 7.46/38/57/27/88.6%  K+ 3.4 Glucose 154 Mag++ 1.5 Albumin 1.9 BNP 443.3 WBC 14.2 Hgb 9.9    Family Communication: Daughters at bedside  Disposition: Status is: Inpatient Remains inpatient appropriate because: critically ill   Total critical care time: 45 minutes Critical care time was exclusive of separately billable procedures and treating other patients. Critical care was necessary to treat or prevent imminent or life-threatening deterioration. Critical care was time spent personally by me on the following activities: development of treatment plan with patient and/or surrogate as well as nursing, discussions with consultants, evaluation of patient's response to treatment, examination of patient,  obtaining history from patient or surrogate, ordering and performing treatments and interventions, ordering and review of laboratory studies, ordering and review of radiographic studies, pulse oximetry and re-evaluation of patient's condition.   Unresulted Labs (From admission, onward)     Start     Ordered   06/04/23 2043  Legionella Pneumophila Serogp 1 Ur Ag  Once,   R        06/04/23 2048             Author: Jonah Blue, MD 06/18/2023 7:54 AM  For on call review [email or web address].

## 2023-07-03 NOTE — Progress Notes (Signed)
PHARMACY CONSULT NOTE - ELECTROLYTES  Pharmacy Consult for Electrolyte Monitoring and Replacement   Recent Labs: Height: 6' (182.9 cm) Weight: 77.9 kg (171 lb 11.8 oz) IBW/kg (Calculated) : 77.6 Estimated Creatinine Clearance: 93 mL/min (by C-G formula based on SCr of 0.61 mg/dL). Potassium (mmol/L)  Date Value  06/24/2023 3.4 (L)  07/10/2014 3.1 (L)   Magnesium (mg/dL)  Date Value  40/98/1191 1.5 (L)   Calcium (mg/dL)  Date Value  47/82/9562 8.5 (L)   Calcium, Total (mg/dL)  Date Value  13/01/6577 8.9   Albumin (g/dL)  Date Value  46/96/2952 1.9 (L)  07/10/2014 3.4   Phosphorus (mg/dL)  Date Value  84/13/2440 4.1   Sodium (mmol/L)  Date Value  06/21/2023 136  07/10/2014 138   Assessment  Mark Mccarty is a 73 y.o. male presenting with worsening weakness. PMH significant for Stage IV adenocarcinoma on palliative chemotherapy, atrial fibrillation/flutter on Eliquis, HFpEF, Type 2 diabetes mellitus, COPD, malignant pleural effusion s/p Pleurx catheter, hyperlipidemia, hypertension. Pharmacy has been consulted to monitor and replace electrolytes.  Diet: Regular MIVF: None Pertinent medications: MagOx 400 mg daily. Patient has received magnesium sulfate 4 g IV (2 g + 2 g) and is ordered Kcl 10 mEq IV x 4 doses on 12/5  Goal of Therapy:  --K > 4, Mg > 2  Plan:  Re-check labs at 1700 today given diuresis Patient is on BiPAP so will likely have to replace electrolytes orally  Thank you for allowing pharmacy to be a part of this patient's care.  Tressie Ellis 06/05/2023 7:57 AM

## 2023-07-03 NOTE — Progress Notes (Signed)
Pharmacy Antibiotic Note  Mark Mccarty is a 73 y.o. male admitted on 06/04/2023 with sepsis.  Pharmacy has been consulted for Cefepime dosing.  Plan: Cefepime 2 gm IV Q8H ordered to start on 12/5 @ 0630.  Height: 6' (182.9 cm) Weight: 77.9 kg (171 lb 11.8 oz) IBW/kg (Calculated) : 77.6  Temp (24hrs), Avg:97.8 F (36.6 C), Min:97.5 F (36.4 C), Max:98.2 F (36.8 C)  Recent Labs  Lab 06/04/23 1447 06/04/23 1802 06/05/23 0510 06/22/2023 0353  WBC 14.2*  --  10.9* 14.2*  CREATININE 0.74  --  0.48* 0.61  LATICACIDVEN 1.7 1.1  --   --     Estimated Creatinine Clearance: 93 mL/min (by C-G formula based on SCr of 0.61 mg/dL).    Allergies  Allergen Reactions   Penicillins Rash    Stomach hurt Has patient had a PCN reaction causing immediate rash, facial/tongue/throat swelling, SOB or lightheadedness with hypotension: yes Has patient had a PCN reaction causing severe rash involving mucus membranes or skin necrosis: no Has patient had a PCN reaction that required hospitalization: no Has patient had a PCN reaction occurring within the last 10 years: yes If all of the above answers are "NO", then may proceed with Cephalosporin use.     Antimicrobials this admission:   >>    >>   Dose adjustments this admission:   Microbiology results:  BCx:   UCx:    Sputum:    MRSA PCR:   Thank you for allowing pharmacy to be a part of this patient's care.  Mark Mccarty 06/07/2023 5:37 AM

## 2023-07-03 NOTE — Death Summary Note (Signed)
DEATH SUMMARY   Patient Details  Name: Mark Mccarty MRN: [id number] DOB: [birth year] [name]:GEXBMW, Mark German, NP Admission/Discharge Information   Admit Date:  2023-06-22  Date of Death: Date of Death: Jun 24, 2023  Time of Death: Time of Death: 06-26-1217  Length of Stay: 2   Principle Cause of death: Postobstructive pneumonia associated with stage 4 lung cancer  Hospital Diagnoses: Principal Problem:   Sepsis (HCC) Active Problems:   Postobstructive pneumonia   Acute hypoxic respiratory failure (HCC)   COPD with acute exacerbation (HCC)   Primary lung adenocarcinoma, right (HCC)   Essential hypertension   Flutter-fibrillation (HCC)   Goals of care, counseling/discussion   Type 2 diabetes mellitus with other specified complication (HCC)   Bacterial pneumonia   Palliative care encounter   Hospital Course: 73yo with h/o stage 4 lung cancer on palliative chemo with malignant effusion s/p Pleurx catheter, afib on Eliquis, HFpEF, DM, COPD, HTN, and HLD who presented on 2023-06-22 with worsening weakness. He was diagnosed with sepsis due to postobstructive PNA and treated with Vanc/Cefepime -> Ceftriaxone/Azithromycin. He is having frequent 3-4 beat runs of wide complex V tach.  He also developed worsening respiratory failure overnight on 06-24-2023 and was placed on BIPAP with broadening of antibiotics.  Despite ongoing BIPAP, he has persistent hypoxic respiratory failure with poor prognosis due to advanced lung cancer and family opted for comfort measures.  He died comfortably within a few hours with family at his side.  Assessment and Plan: Sepsis due to postobstructive PNA Patient presenting with tachycardia, tachypnea, elevated WBC with CT imaging concerning for obstructive pneumonia S/p Vancomycin, Cefepime/Ceftriaxone, and Azithromycin  Blood cultures NTD CT imaging with increasing airspace consolidation of the right upper lung with new discontinuity of the distal right mainstem bronchus Per  chart review, patient's known stage IV cancer involved in the right upper lobe previously, however opacity in is enlarging, concerning for post obstructive pneumonia. Oncology consulted Patient with respiratory distress overnight and again this AM Having GOC conversations with family at bedside; attempted to discuss further with patient but he is too ill to effectively participate at this time Oncology and palliative care notified of worsening clinical status Transferred to SDU  Transitioned to comfort care Died peacefully surrounded by family   Primary lung adenocarcinoma, right  Patient had an original history of stage IIIa lung cancer followed by stage IV adenocarcinoma, currently on palliative chemotherapy.   Gven changes on CT imaging, patient's cancer appears to be advancing. Oncology consulted with palliative care Pleurx with overnight drainage with 50 cc   Atrial Flutter-fibrillation with runs of Vtach History of atrial fibrillation/flutter with recent cardioversion on 05/25/2023 Having repeated runs of vtach    Goals of care, counseling/discussion Patient understood on admission that his cancer is noncurable and potentially advancing Given concern for developing arrhythmia, further discussed code status and concern for disease progression Patient made DNR/DNI at his request Further discussed GOC with children -> transitioned to comfort measures only     Procedures: None  Consultations: Oncology, palliative care  The results of significant diagnostics from this hospitalization (including imaging, microbiology, ancillary and laboratory) are listed below for reference.   Significant Diagnostic Studies: DG Chest 1 View  Result Date: June 24, 2023 CLINICAL DATA:  73 year old male with shortness of breath and weakness. Lung cancer. EXAM: CHEST  1 VIEW COMPARISON:  Portable chest 0211 hours today.  Chest CTA 2023-06-22. FINDINGS: Portable AP semi upright view at 0838 hours. Stable  right Port-A-Cath, accessed. Abnormal lungs right  greater than left, stable ventilation. Stable cardiac size and mediastinal contours. Lung base right pleural drain remains visible. No pneumothorax or new pulmonary opacity identified. IMPRESSION: Stable, right lung base pleural drain remains in place. No new cardiopulmonary abnormality. Electronically Signed   By: Odessa Fleming M.D.   On: 07/02/2023 08:46   DG Chest 1 View  Result Date: 06/03/2023 CLINICAL DATA:  Shortness of breath EXAM: CHEST  1 VIEW COMPARISON:  06/04/2023 FINDINGS: Unchanged position of right chest wall Port-A-Cath with tip in the upper SVC. Unchanged size of loculated right pleural effusion with tunneled pleural drain at the right lung base. Left lung is clear. IMPRESSION: Unchanged size of loculated right pleural effusion with tunneled pleural drain at the right lung base. Electronically Signed   By: Deatra Robinson M.D.   On: 06/19/2023 04:08   DG Ankle Complete Left  Result Date: 06/04/2023 CLINICAL DATA:  Status post fall 2 days ago with left ankle pain and swelling EXAM: LEFT ANKLE COMPLETE - 3 VIEW COMPARISON:  None Available. FINDINGS: There are no findings of fracture or dislocation. No joint effusion. Subtle sclerosis in the distal fibular diaphysis. Ankle mortise is intact. Soft tissues are unremarkable. IMPRESSION: 1. No acute fracture or dislocation. 2. Subtle sclerosis in the distal fibular diaphysis may reflect sequela of remote injury. Electronically Signed   By: Agustin Cree M.D.   On: 06/04/2023 20:35   CT Angio Chest PE W and/or Wo Contrast  Result Date: 06/04/2023 CLINICAL DATA:  Lung carcinoma, shortness of breath EXAM: CT ANGIOGRAPHY CHEST WITH CONTRAST TECHNIQUE: Multidetector CT imaging of the chest was performed using the standard protocol during bolus administration of intravenous contrast. Multiplanar CT image reconstructions and MIPs were obtained to evaluate the vascular anatomy. RADIATION DOSE REDUCTION: This exam  was performed according to the departmental dose-optimization program which includes automated exposure control, adjustment of the mA and/or kV according to patient size and/or use of iterative reconstruction technique. CONTRAST:  75mL OMNIPAQUE IOHEXOL 350 MG/ML SOLN COMPARISON:  05/08/2023 FINDINGS: Cardiovascular: Right IJ port catheter loops in the IJ tip in the right brachiocephalic vein as before. SVC patent. Heart size normal. Small pericardial effusion. Satisfactory opacification of pulmonary arteries noted, and there is no evidence of pulmonary emboli. Scattered left coronary calcifications. Adequate contrast opacification of the thoracic aorta with no evidence of dissection, aneurysm, or stenosis. There is scattered plaque in the arch and descending thoracic aorta. Classic 3-vessel brachiocephalic arch anatomy without proximal stenosis. Mediastinum/Nodes: Right perihilar consolidation extending around the distal right mainstem bronchus with mild narrowing. Anterior pericardial lymph nodes measure up to 1.1 cm short axis diameter, stable. Stable 8 mm AP window node. New 1.1 cm left hilar adenopathy. New discontinuity of the mainstem bronchus along its distal right aspect just above the carina. Contained extraluminal gas medial to the consolidated right upper lung. Lungs/Pleura: Tunneled right pleural drain catheter at the lung base with relatively stable loculated basilar effusion. Increasing airspace consolidation in apical and posterior segments right upper lung. Pulmonary emphysema. Stable 2.7 cm pleural based consolidation in the superior segment left lower lobe. Upper Abdomen: No adrenal mass.  No acute findings. Musculoskeletal: Vertebral endplate spurring at multiple levels in the lower thoracic spine. Stable sclerosis in the T10 vertebral body. Review of the MIP images confirms the above findings. IMPRESSION: 1. Negative for acute PE or thoracic aortic dissection. 2. Increasing airspace  consolidation in the right upper lung with new discontinuity of the distal right mainstem bronchus and contained extraluminal gas  medial to the consolidated right upper lung. 3. Stable loculated right pleural effusion with tunneled pleural drain catheter in place. 4. New 1.1 cm left hilar adenopathy. 5. Aortic Atherosclerosis (ICD10-I70.0) and Emphysema (ICD10-J43.9). Electronically Signed   By: Corlis Leak M.D.   On: 06/04/2023 18:44   DG Chest 2 View  Result Date: 06/04/2023 CLINICAL DATA:  Weakness. Chest pain. 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 EXAM: CHEST - 2 VIEW COMPARISON:  Chest radiograph dated May 26, 2023. FINDINGS: Right internal jugular Port-A-Cath with tip projecting over the proximal SVC. Similar position of a tunneled pleural drainage catheter at the right lung base. The loculated right pleural effusion is not significantly changed in volume compared to the prior exam. Similar right apical opacification. Slightly increased airspace opacities in the right upper lung. No focal consolidation in the left lung. Visualized osseous structures are unchanged. IMPRESSION: 1. Similar loculated right pleural effusion with tunneled pleural drainage catheter at the right lung base. 2. Mildly increased airspace opacities in the right upper lung. 3. Similar right apical opacification. Electronically Signed   By: Hart Robinsons M.D.   On: 06/04/2023 15:48   DG Chest 2 View  Result Date: 05/26/2023 CLINICAL DATA:  CHF. 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 EXAM: CHEST - 2 VIEW COMPARISON:  05/25/23 FINDINGS: There is a right IJ porta catheter with tip in the projection of the proximal SVC. The tunneled pleural drainage catheter is again noted overlying the right lung base. The loculated right pleural effusion appears mildly increased in volume compared with the prior examination. Unchanged right apical opacification.  Airspace opacities within the right upper lung is again noted in appears increased from the previous exam. Left lung is clear. Osseous structures appear intact. IMPRESSION: 1. Mild increase in volume of loculated right pleural effusion with tunneled pleural drainage catheter in place. 2. Increased airspace opacities within the right upper lung. 3. Unchanged right apical opacification. Electronically Signed   By: Signa Kell M.D.   On: 05/26/2023 07:59   DG Chest Portable 1 View  Result Date: 05/25/2023 CLINICAL DATA:  Shortness of breath.  Lung cancer. EXAM: PORTABLE CHEST 1 VIEW COMPARISON:  Chest CT 05/08/2023.  Chest x-ray 03/08/2023. FINDINGS: Similar volume loss right hemithorax. Airspace disease noted right upper lung compatible with the consolidative disease seen on chest CT. Asymmetric elevation right hemidiaphragm. Small right pleural effusion with pleural drain identified at the right lung base. Opacity in the right apex compatible with loculated fluid and lung collapse as seen on the previous CT scan. Right Port-A-Cath again noted with tip position probably in the low right innominate vein. Left lung clear. Cardiopericardial silhouette is at upper limits of normal for size. Telemetry leads overlie the chest. IMPRESSION: 1. Similar volume loss right hemithorax with airspace disease right upper lung compatible with the consolidative disease seen on chest CT. 2. Small right pleural effusion with pleural drain at the right lung base. 3. Opacity in the right apex compatible with loculated fluid and lung collapse as seen on the previous CT scan. Electronically Signed   By: Kennith Center M.D.   On: 05/25/2023 14:22   CT HEAD WO CONTRAST ( )  Result Date: 05/21/2023 CLINICAL DATA:  Status post fall. Dizziness. On blood thinners. History of lung cancer. EXAM: CT HEAD WITHOUT CONTRAST TECHNIQUE: Contiguous axial images were obtained from the base of the skull through the vertex without intravenous  contrast. RADIATION DOSE REDUCTION: This exam was performed  according to the departmental dose-optimization program which includes automated exposure control, adjustment of the mA and/or kV according to patient size and/or use of iterative reconstruction technique. COMPARISON:  Head MRI 12/05/2022 FINDINGS: Brain: There is no evidence of an acute infarct, intracranial hemorrhage, mass, midline shift, or extra-axial fluid collection. Slight cerebral atrophy is within normal limits for age. 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 Cerebral white matter hypodensities are nonspecific but compatible with minimal chronic small vessel ischemic disease. Vascular: No hyperdense vessel. Skull: No acute fracture or suspicious osseous lesion. Sinuses/Orbits: Minimal fluid in the left sphenoid sinus. Clear mastoid air cells. Left cataract extraction. Other: None. IMPRESSION: No evidence of acute intracranial abnormality. Electronically Signed   By: Sebastian Ache M.D.   On: 05/21/2023 13:18   CT CHEST ABDOMEN PELVIS W CONTRAST  Result Date: 05/10/2023 CLINICAL DATA:  Right lung cancer restaging * Tracking Code: BO * EXAM: CT CHEST, ABDOMEN, AND PELVIS WITH CONTRAST TECHNIQUE: Multidetector CT imaging of the chest, abdomen and pelvis was performed following the standard protocol during bolus administration of intravenous contrast. RADIATION DOSE REDUCTION: This exam was performed according to the departmental dose-optimization program which includes automated exposure control, adjustment of the mA and/or kV according to patient size and/or use of iterative reconstruction technique. CONTRAST:  80mL OMNIPAQUE IOHEXOL 300 MG/ML  SOLN COMPARISON:  02/18/2023 (chest) and 11/16/2022 (abdomen/pelvis). FINDINGS: CT CHEST FINDINGS Cardiovascular: The Port-A-Cath catheter appears to loop up into the right internal jugular vein and extends caudad to terminate in the right brachiocephalic vein, similar to prior exams  including 02/01/2023. Atheromatous vascular calcification of the thoracic aorta. Stable small pericardial effusion. Mediastinum/Nodes: Persistent hazy density around the right mainstem bronchus, without a discretely measurable lymph node or lesion in this vicinity. Lymph nodes just outside of the pericardium in the mediastinal adipose tissue measure up to 1.4 cm in short axis on image 47 series 2, previously 0.9 cm. There is some high density nodularity along the right anterior pericardium for example on image 46 series 2, possibly from pericardial lesion or adjacent lymph node. Previously hypermetabolic AP window lymph node 0.6 cm in short axis on image 28 series 2, formerly 0.8 cm on 02/18/2023. Lungs/Pleura: Collapsed and partially consolidated right upper lobe, hypodense medial portion measures about 1.7 cm in thickness on image 25 series 2, formerly about 2.2 cm in thickness. This hypodense portion the may represent original tumor but is indistinctly marginated against the atelectatic lung. The right upper lobe bronchus is truncated. Reduced size right pleural effusion with a right pleural drainage catheter in place. Currently the effusion is small to moderate. Notable enhancement along the parietal pleura. Trace gas along the pleural space at the lung base. Emphysema. Previously hypermetabolic posterior consolidation in the superior segment right lower lobe and posteriorly in the right middle lobe, this along was previously atelectatic. Current appearance could be from pneumonia or aspiration pneumonitis, versus prior radiation therapy. Scattered pleural nodularity anteriorly along the right middle lobe, small pleural deposits of tumor not excluded. Subpleural nodularity in the right lower lobe observed, index lesion 5 mm in diameter on image 117 series 4. Stable left apical pleuroparenchymal scarring. A 3.0 by 2.0 cm masslike consolidation in the superior segment left lower lobe previously measured 2.7 by 1.9  cm by my measurements. Accordingly this is mildly enlarged. Musculoskeletal: Lower thoracic spondylosis. Probable sebaceous cyst or similar benign lesion along the junction of the right posterior neck and chest, 1.2 cm in short axis on  image 1 series 2, previously not hypermetabolic. 0.9 cm sclerotic left eleventh rib lesion medially on image 57 series 2, not previously hypermetabolic. Probably a bone island. CT ABDOMEN PELVIS FINDINGS Hepatobiliary: Contracted gallbladder.  Otherwise unremarkable. Pancreas: Unremarkable Spleen: Unremarkable Adrenals/Urinary Tract: Fullness of both adrenal glands without a discrete mass. The kidneys appear unremarkable. Stomach/Bowel: Scattered colonic diverticula. Vascular/Lymphatic: Small right gastric node 0.7 cm in short axis on image 65 series 2 Atherosclerosis is present, including aortoiliac atherosclerotic disease. Reproductive: Prominent median lobe of the prostate gland indents the bladder base. Other: Unremarkable Musculoskeletal: Left-sided lipoma interposed between the gluteus maximus and the greater trochanter. Hemangioma eccentric to the left in the L2 vertebral body, image 78 series 2. Probable bone island in the right iliac bone. Chronic avascular necrosis of both femoral heads. Degenerative endplate sclerosis inferiorly along the L3 endplate with a sclerotic lesion eccentric to the left in the L3 vertebral body which is nonspecific but not previously hypermetabolic. Probable superficial sebaceous cyst along the right upper thigh/groin region, 1.2 cm in short axis on image 132 series 2, present and not previously hypermetabolic PET-CT 12/03/2022. IMPRESSION: 1. Reduced size of the large right upper lobe central tumor, currently 1.7 cm in transverse thickness and previously 2.2 cm. 2. Reduced size right pleural effusion with a right pleural drainage catheter in place. Currently the effusion is small to moderate. Enhancing right pleural metastatic foci. Trace gas  along the pleural space at the lung base. 3. Airspace opacity in the superior segment right lower lobe and posteriorly in the right middle lobe, this along was previously atelectatic. Pneumonia not excluded. 4. Subpleural nodularity in the right lower lobe and right middle lobe observed, index lesion 5 mm in diameter. 5. Mild enlargement of a masslike consolidation in the superior segment left lower lobe, currently 3.0 by 2.0 cm, previously 2.7 by 1.9 cm. 6. Persistent hazy density around the right mainstem bronchus, without a discretely measurable lymph node or lesion in this vicinity. 7. Lymph nodes just outside of the pericardium in the mediastinal adipose tissue measure up to 1.4 cm in short axis, previously 0.9 cm. There is some high density nodularity along the right anterior pericardium for example, possibly from pericardial lesion or adjacent lymph node. 8. Stable small pericardial effusion. 9. Emphysema. 10. Chronic avascular necrosis of both femoral heads. 11. Prominent median lobe of the prostate gland indents the bladder base. 12. Scattered colonic diverticula. 13. Aortic atherosclerosis. Aortic Atherosclerosis (ICD10-I70.0) and Emphysema (ICD10-J43.9). Electronically Signed   By: Gaylyn Rong M.D.   On: 05/10/2023 08:47    Microbiology: Recent Results (from the past 240 hour(s))  Culture, blood (Routine x 2)     Status: None (Preliminary result)   Collection Time: 06/04/23  2:47 PM   Specimen: BLOOD  Result Value Ref Range Status   Specimen Description BLOOD BLOOD RIGHT WRIST  Final   Special Requests   Final    BOTTLES DRAWN AEROBIC AND ANAEROBIC Blood Culture adequate volume   Culture   Final    NO GROWTH < 24 HOURS Performed at [facility], [address] [place], [place] [address]    Report Status PENDING  Incomplete  Culture, blood (Routine x 2)     Status: None (Preliminary result)   Collection Time: 06/04/23  4:10 PM   Specimen: BLOOD  Result Value Ref Range  Status   Specimen Description BLOOD LEFT ANTECUBITAL  Final   Special Requests   Final    BOTTLES DRAWN AEROBIC AND  ANAEROBIC Blood Culture adequate volume   Culture   Final    NO GROWTH < 12 HOURS Performed at [facility], [address] [place], [place] [address]    Report Status PENDING  Incomplete  Resp panel by RT-PCR (RSV, Flu A&B, Covid) Anterior Nasal Swab     Status: None   Collection Time: 06/04/23  4:10 PM   Specimen: Anterior Nasal Swab  Result Value Ref Range Status   SARS Coronavirus 2 by RT PCR NEGATIVE NEGATIVE Final    Comment: (NOTE) SARS-CoV-2 target nucleic acids are NOT DETECTED.  The SARS-CoV-2 RNA is generally detectable in upper respiratory specimens during the acute phase of infection. The lowest concentration of SARS-CoV-2 viral copies this assay can detect is 138 copies/mL. A negative result does not preclude SARS-Cov-2 infection and should not be used as the sole basis for treatment or other patient management decisions. A negative result may occur with  improper specimen collection/handling, submission of specimen other than nasopharyngeal swab, presence of viral mutation(s) within the areas targeted by this assay, and inadequate number of viral copies(<138 copies/mL). A negative result must be combined with clinical observations, patient history, and epidemiological information. The expected result is Negative.  Fact Sheet for Patients:  BloggerCourse.com  Fact Sheet for Healthcare Providers:  SeriousBroker.it  This test is no t yet approved or cleared by the [place] FDA and  has been authorized for detection and/or diagnosis of SARS-CoV-2 by FDA under an Emergency Use Authorization (EUA). This EUA will remain  in effect (meaning this test can be used) for the duration of the COVID-19 declaration under Section 564(b)(1) of the Act, 21 U.S.C.section 360bbb-3(b)(1), unless the  authorization is terminated  or revoked sooner.       Influenza A by PCR NEGATIVE NEGATIVE Final   Influenza B by PCR NEGATIVE NEGATIVE Final    Comment: (NOTE) The Xpert Xpress SARS-CoV-2/FLU/RSV plus assay is intended as an aid in the diagnosis of influenza from Nasopharyngeal swab specimens and should not be used as a sole basis for treatment. Nasal washings and aspirates are unacceptable for Xpert Xpress SARS-CoV-2/FLU/RSV testing.  Fact Sheet for Patients: BloggerCourse.com  Fact Sheet for Healthcare Providers: SeriousBroker.it  This test is not yet approved or cleared by the [place] FDA and has been authorized for detection and/or diagnosis of SARS-CoV-2 by FDA under an Emergency Use Authorization (EUA). This EUA will remain in effect (meaning this test can be used) for the duration of the COVID-19 declaration under Section 564(b)(1) of the Act, 21 U.S.C. section 360bbb-3(b)(1), unless the authorization is terminated or revoked.     Resp Syncytial Virus by PCR NEGATIVE NEGATIVE Final    Comment: (NOTE) Fact Sheet for Patients: BloggerCourse.com  Fact Sheet for Healthcare Providers: SeriousBroker.it  This test is not yet approved or cleared by the [place] FDA and has been authorized for detection and/or diagnosis of SARS-CoV-2 by FDA under an Emergency Use Authorization (EUA). This EUA will remain in effect (meaning this test can be used) for the duration of the COVID-19 declaration under Section 564(b)(1) of the Act, 21 U.S.C. section 360bbb-3(b)(1), unless the authorization is terminated or revoked.  Performed at [facility], [address] [place], [place] [address]   MRSA Next Gen by PCR, Nasal     Status: None   Collection Time: 06/04/23  8:03 PM   Specimen: Nasal Mucosa; Nasal Swab  Result Value Ref Range Status   MRSA by PCR Next Gen  NOT DETECTED NOT DETECTED Final    Comment: (NOTE) The GeneXpert MRSA Assay (FDA approved for NASAL specimens only), is one component of a comprehensive MRSA colonization surveillance program. It is not intended to diagnose MRSA infection nor to guide or monitor treatment for MRSA infections. Test performance is not FDA approved in patients less than 19 years old. Performed at [facility], [address] [place], [place] [address]     Time spent: 50 minutes  Signed: Jonah Blue, MD 06/29/2023

## 2023-07-03 NOTE — Significant Event (Signed)
Rapid Response Event Note   Reason for Call :  SOB, Tachycardia and Tachypnea  Initial Focused Assessment:  Patient with increase work of breathing on NRB O2 86% BP 128/75 HR128. Patient able to talk in short sentences currently using urinal.  Patient with wheezing in upper lungs biliaterelly. Breathing treatment given by RT at bedside .Manuela Schwartz NP at bedside. Patient placed on Bipap, Plurx Catheter drained and morphine given.  Patient Spo2 90-92% on bipap.  Interventions:  -Bipap -Breathing Treatment -CXR -Drain Plurx -Labs  -Morphine  Plan of Care:  -Diuresis patient -Bipap -Goals of care conversation with Palliative in the AM     MD Notified:  Manuela Schwartz NP Call [phone] Arrival [phone] End Time:0400  Judyann Munson, RN

## 2023-07-03 NOTE — Significant Event (Signed)
Rapid Response Event Note   Reason for Call :  Shortness of breath, tachypneia, and decrease oxygen saturation.  Initial Focused Assessment:   Dr. Ophelia Charter at beside with patient and his 2 daughters.  Patient on 100% bipap- oxygen saturation in mid 80's. HR 140's and blood pressure stable.  Patient alert and oriented.      Interventions:  Dr. Ophelia Charter spoke with patient and family about end of life.  Patient is DNR/DNI- patient was to transfer to PCU for continuous bipap and continue to talk about goals of care with Palliative team.   Plan of Care:  No available beds open on PCU- so patient transferred to [facility].     Event Summary:   MD Notified: Dr. Ophelia Charter Call Time: Arrival Time: End Time:  Vincente Poli, RN

## 2023-07-03 NOTE — Progress Notes (Signed)
Pt expired at 1218 comfortably with family at bedside.

## 2023-07-03 NOTE — Progress Notes (Signed)
   06/24/2023 0020  Assess: MEWS Score  Temp 97.9 F (36.6 C)  BP 137/70  MAP (mmHg) 90  Pulse Rate 91  Resp (!) 36  SpO2 (!) 89 %  O2 Device Non-rebreather Mask  O2 Flow Rate (L/min) 10 L/min  Assess: MEWS Score  MEWS Temp 0  MEWS Systolic 0  MEWS Pulse 0  MEWS RR 3  MEWS LOC 0  MEWS Score 3  MEWS Score Color Yellow  Assess: if the MEWS score is Yellow or Red  Were vital signs accurate and taken at a resting state? Yes  Does the patient meet 2 or more of the SIRS criteria? Yes  Does the patient have a confirmed or suspected source of infection? Yes  MEWS guidelines implemented  Yes, yellow  Treat  MEWS Interventions Considered administering scheduled or prn medications/treatments as ordered  Take Vital Signs  Increase Vital Sign Frequency  Yellow: Q2hr x1, continue Q4hrs until patient remains green for 12hrs  Escalate  MEWS: Escalate Yellow: Discuss with charge nurse and consider notifying provider and/or RRT  Notify: Charge Nurse/RN  Name of Charge Nurse/RN Notified Baron Hamper, RN  Provider Notification  Provider Name/Title Manuela Schwartz, NP  Date Provider Notified 07/01/2023  Time Provider Notified 0020  Method of Notification Page (secure chat)  Notification Reason Change in status;Other (Comment) (Yellow MEWS)  Provider response See new orders  Date of Provider Response 06/19/2023  Time of Provider Response 0024  Assess: SIRS CRITERIA  SIRS Temperature  0  SIRS Pulse 1  SIRS Respirations  1  SIRS WBC 0  SIRS Score Sum  2

## 2023-07-03 NOTE — Progress Notes (Signed)
At 0815 rapid was called. His O2 sats are in low 80s to 87% on 100% O2 on BiPAP, HR 140 158/78 resp 30s. MD , RT and ICU nurse, Charge nurse at bedside. IV lasix and morphine administered. Pt unable to recover on 100% o2on BiPAP. Pt transferred to [facility]

## 2023-07-03 NOTE — Progress Notes (Signed)
Responded to Rapid paged- no family present and medical team in room-no needs at this time. Staff aware to page if needs arise,

## 2023-07-03 NOTE — Hospital Course (Addendum)
72yo with h/o stage 4 lung cancer on palliative chemo with malignant effusion s/p Pleurx catheter, afib on Eliquis, HFpEF, DM, COPD, HTN, and HLD who presented on 12/3 with worsening weakness. He was diagnosed with sepsis due to postobstructive PNA and treated with Vanc/Cefepime -> Ceftriaxone/Azithromycin. He is having frequent 3-4 beat runs of wide complex V tach.  He also developed worsening respiratory failure overnight on 12/5 and was placed on BIPAP with broadening of antibiotics.  Despite ongoing BIPAP, he has persistent hypoxic respiratory failure with poor prognosis due to advanced lung cancer and family opted for comfort measures.  He died comfortably within a few hours with family at his side.

## 2023-07-03 NOTE — Progress Notes (Addendum)
       CROSS COVER NOTE  NAME: Mark Mccarty MRN: [id number] DOB : 12/02/1950    Concern as stated by nurse / staff   Message received from [facility], this patient is here w/ PNA. He has a hx of COPD and lung cancer w/ a PleurX cath. He was on 3.5L O2 earlier but is now requiring a non-rebreather 15L to get above 88%. I've notified RT and they're gonna put him on a high flow. RR is 38 and right lung sounds congested.    Pertinent findings on chart review: Mr Ugolini has stage 4 lung cancer on palliative chemo with malignant effusions s/p Pleur x catheter. Also has afib on eliquis, heart failure, diabetes, COPD, HTN and HLD. He presented with shortness of breath on 12/3. Initial eval showed postobstructive pneumonia and was having frequent runs of wide complex v tach. Iniitially he showed imrovement with wide spectrum antibiotics and supportive therapies. It appears he did receive initial dose of IV steroids then oral to start this am. CT chest abd pelvis on 12/3 IMPRESSION: 1. Negative for acute PE or thoracic aortic dissection. 2. Increasing airspace consolidation in the right upper lung with new discontinuity of the distal right mainstem bronchus and contained extraluminal gas medial to the consolidated right upper lung. 3. Stable loculated right pleural effusion with tunneled pleural drain catheter in place. 4. New 1.1 cm left hilar adenopathy. 5. Aortic Atherosclerosis (ICD10-I70.0) and Emphysema (ICD10-J43.9).   Assessment and  Interventions   Assessment:    06/11/2023    2:58 AM 06/22/2023   12:20 AM 06/05/2023    8:35 PM  Vitals with BMI  Systolic 153 137 119  Diastolic 81 70 72  Pulse 106 91 107   Initially patient treated with dose of lasix and had good response. Around 230 and he again had drop in oxygen saturations accompanied by tachycardia and significant increase in work of breathing. 50 ml drained from pleur x. Sinus tach on bedside monitor Chest  xray appeared to me to have increased patchy opacities - add to radiology read, right side has imm Meeting SIRS criteria with tachycardia and tachypnic - worsening resp failure Plan: BIPAP - patient and family informed he is at max support  without invasive ventilation - ABG on 7.46 - PCO2 38 PO2, 57. HCO3 27 Broaden antibiotics - white count up to ; D/C rocephin, restarted cefepime Solumedrol 125 IV x 1 f/b 40 mg  Supplement electrolytes K goal above 4, mag above 2 Added BNP up to 448 -- ;last echo in 12/2022 - consider repeat echo especially considering CT findings and overt resp failure  Family informed of all and picture of grim prognosis. However as they seem to understand the process here they are hopeful.    Donnie Mesa NP [facility] Cross Cover 7pm-7am - check amion for availability Pager [phone]

## 2023-07-03 NOTE — Progress Notes (Addendum)
Hematology/Oncology Progress note [phone] [phone]     Patient Care Team: [name], NP as PCP - General (Family Medicine) [name], RN as Registered Nurse [name], MD as Consulting Physician (Oncology) [name], MD as Referring Physician (Radiation Oncology) Annice Needy, MD as Referring Physician (Vascular Surgery)   Name of the patient: Mark Mccarty  [id number]  May 12, 1951  Date of visit: 06/21/2023   INTERVAL HISTORY-   Overnight, respiratory status worsened, hypoxia with increased oxygen need on non rebreather and there is increase in work of breathing.  BNP increased to 448, antibiotics were broadened, Steroid.  He was put on BIPAP    Allergies  Allergen Reactions   Penicillins Rash    Stomach hurt Has patient had a PCN reaction causing immediate rash, facial/tongue/throat swelling, SOB or lightheadedness with hypotension: yes Has patient had a PCN reaction causing severe rash involving mucus membranes or skin necrosis: no Has patient had a PCN reaction that required hospitalization: no Has patient had a PCN reaction occurring within the last 10 years: yes If all of the above answers are "NO", then may proceed with Cephalosporin use.     Patient Active Problem List   Diagnosis Date Noted   Primary lung adenocarcinoma, right (HCC) 12/13/2017    Priority: High   Hypotension 05/28/2023    Priority: Medium    Thrush 05/28/2023    Priority: Medium    Encounter for antineoplastic chemotherapy 01/01/2023    Priority: Medium    Weight loss 01/01/2023    Priority: Medium    Pleural effusion 12/17/2022    Priority: Medium    Anxiety associated with cancer diagnosis (HCC) 11/21/2022    Priority: Low   Port-A-Cath in place 03/15/2020    Priority: Low   Hypokalemia 01/07/2018    Priority: Low   Bacterial pneumonia 06/05/2023   Palliative care encounter 06/05/2023   Sepsis (HCC) 06/04/2023   Postobstructive  pneumonia 06/04/2023   Acute hypoxic respiratory failure (HCC) 06/04/2023   COPD with acute exacerbation (HCC) 06/04/2023   Hypomagnesemia 05/26/2023   Abdominal pain 05/26/2023   Near syncope 05/25/2023   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   Drug-induced neutropenia (HCC) 03/18/2023   Atrial flutter with rapid ventricular response (HCC) 01/10/2023   Vitamin B12 deficiency 09/17/2022   OAB (overactive bladder) 09/22/2021   Liver lesion 03/15/2020   DDD (degenerative disc disease), lumbar 08/04/2019   Obesity (BMI 30.0-34.9) 08/04/2019   Umbilical hernia without obstruction and without gangrene 04/07/2019   Centrilobular emphysema (HCC) 03/18/2019   Peripheral polyneuropathy 03/18/2019   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   H/O adenomatous polyp of colon 01/20/2019   Avascular necrosis of femoral head (HCC) 12/10/2018   [name] 09/18/2018   Elevated PSA, less than 10 ng/ml 03/22/2018   BPH (benign prostatic hyperplasia) 03/19/2018   Benign prostatic hyperplasia with nocturia 03/19/2018   Goals of care, counseling/discussion 12/13/2017   SOB (shortness of breath) on exertion 10/02/2017   Incomplete immunization status 09/14/2017   Pedal edema 12/04/2016   Hyperuricemia 05/02/2016   Type 2 diabetes mellitus with other specified complication (HCC) 05/02/2016   High risk medication use 09/12/2015   S/P cardiac catheterization 12/23/2014   Chest pain with high risk for cardiac etiology 12/16/2014   Abnormal nuclear stress test 12/16/2014   Adenomatous polyp of colon 04/02/2014   Dysphagia 04/02/2014   Family history of colon cancer 03/06/2014   Gastroesophageal reflux 03/06/2014   Hyperlipidemia, unspecified 03/06/2014   Essential hypertension 03/06/2014  Aortic insufficiency 01/20/2014   Erectile dysfunction 01/20/2014   Former smoker 01/20/2014   Irregular heart beat 01/20/2014   Left ventricular diastolic dysfunction 01/20/2014   Peptic ulcer  disease 01/20/2014     Past Medical History:  Diagnosis Date   Abnormal nuclear stress test    Anxiety    a.) on BZO (lorazepam) PRN   Asthma    Avascular necrosis of femoral head (HCC)    BPH (benign prostatic hyperplasia)    Cataract    a.) s/p extraction on LEFT   Colon adenomas    COPD (chronic obstructive pulmonary disease) (HCC)    Coronary artery disease 11/11/2014   a.) MPI 11/11/2014: EF 56%, mild inf wall ischemia; b.) LHC 12/16/2014: 20% mRCA - med mgmt   DDD (degenerative disc disease), lumbar    Diverticulosis    DM type 2 (diabetes mellitus, type 2) (HCC)    Duodenitis    Dysphagia    Dyspnea    ED (erectile dysfunction)    a.) on PDE5i (sildenafil) PRN   Elevated PSA    Former smoker    Gastritis    GERD (gastroesophageal reflux disease)    [name]    Hilar mass    History of hiatal hernia    Hyperlipidemia    Hypertension    Hyperuricemia    Hypokalemia    Left ventricular diastolic dysfunction 10/04/2016   a.) TTE 10/04/2016: EF 50%, mild MR/TR/PR, mod AR, G1DD; b.) TTE 10/10/2017: EF 45%, triv PR, mild AR/MR/TR, G1DD; c.) TTE 03/30/2020: EF 50%, mild-mod AR, mild MR/TR/PR; d.) TTE 04/23/2022: EF >55%, mild LVH, mild LAE, triv TR, mild PR, mod AR/MR   Liver lesion    Obesity    Pedal edema    Peripheral polyneuropathy    Peyronie's disease 05/2022   Pleural effusion    Primary lung adenocarcinoma, right (HCC)    a.) stage IIIA (cT3, cN1, cM0) --> s/p concurrent chemoradiation 2019 --> carboplatin + pacilitaxel (12/31/2017 - 02/19/2018) followed by 1 year (03/24/2018 - 03/17/2019) adjuvant anti-PDL-1 mAB immunotherapy (durvalumab)   [name] (peptic ulcer disease)    Renal insufficiency    Umbilical hernia    [name]      Past Surgical History:  Procedure Laterality Date   CARDIAC CATHETERIZATION N/A 12/16/2014   Procedure: Left Heart Cath;  Surgeon: Marcina Millard, MD;  Location: [facility];   Service: Cardiovascular;  Laterality: N/A;   CARDIOVERSION N/A 01/17/2023   Procedure: CARDIOVERSION;  Surgeon: Clotilde Dieter, DO;  Location: [name];  Service: Cardiovascular;  Laterality: N/A;   CATARACT EXTRACTION Left    COLONOSCOPY     COLONOSCOPY WITH PROPOFOL N/A 01/14/2020   Procedure: COLONOSCOPY WITH PROPOFOL;  Surgeon: Toledo, Boykin Nearing, MD;  Location: [facility];  Service: Gastroenterology;  Laterality: N/A;   CYST REMOVAL TRUNK     chest and back over time and it was removed   CYSTECTOMY     ENDOBRONCHIAL ULTRASOUND N/A 12/09/2017   Procedure: ENDOBRONCHIAL ULTRASOUND;  Surgeon: Shane Crutch, MD;  Location: [name];  Service: Pulmonary;  Laterality: N/A;   IR GUIDED DRAIN W CATHETER PLACEMENT  03/21/2023   PORTA CATH INSERTION N/A 12/23/2017   Procedure: PORTA CATH INSERTION;  Surgeon: Annice Needy, MD;  Location: [facility];  Service: Cardiovascular;  Laterality: N/A;   PORTA CATH INSERTION N/A 01/08/2023   Procedure: PORTA CATH INSERTION;  Surgeon: Annice Needy, MD;  Location: [facility];  Service: Cardiovascular;  Laterality: N/A;   PORTA CATH REMOVAL N/A 06/04/2022   Procedure: PORTA CATH REMOVAL;  Surgeon: Annice Needy, MD;  Location: [facility];  Service: Cardiovascular;  Laterality: N/A;   TEE WITHOUT CARDIOVERSION N/A 01/17/2023   Procedure: TRANSESOPHAGEAL ECHOCARDIOGRAM (TEE);  Surgeon: Clotilde Dieter, DO;  Location: [name];  Service: Cardiovascular;  Laterality: N/A;   UPPER GI ENDOSCOPY     VIDEO BRONCHOSCOPY WITH ENDOBRONCHIAL ULTRASOUND N/A 12/17/2022   Procedure: VIDEO BRONCHOSCOPY WITH ENDOBRONCHIAL ULTRASOUND;  Surgeon: Vida Rigger, MD;  Location: [name];  Service: Thoracic;  Laterality: N/A;    Social History   Socioeconomic History   Marital status: Divorced    Spouse name: Not on file   Number of children: Not on file   Years of education: Not on file   Highest education level: Not on file   Occupational History   Not on file  Tobacco Use   Smoking status: Former    Current packs/day: 0.00    Average packs/day: 1.5 packs/day for 35.0 years (52.5 ttl pk-yrs)    Types: Cigarettes    Start date: 06/21/1980    Quit date: 06/22/2015    Years since quitting: 7.9    Passive exposure: Past   Smokeless tobacco: Never  Vaping Use   Vaping status: Never Used  Substance and Sexual Activity   Alcohol use: Not Currently    Comment: weekends   Drug use: No   Sexual activity: Not Currently  Other Topics Concern   Not on file  Social History Narrative   Lives with son [name].   Social Determinants of Health   Financial Resource Strain: Not on file  Food Insecurity: No Food Insecurity (06/05/2023)   Hunger Vital Sign    Worried About Running Out of Food in the Last Year: Never true    Ran Out of Food in the Last Year: Never true  Transportation Needs: No Transportation Needs (06/05/2023)   PRAPARE - Administrator, Civil Service (Medical): No    Lack of Transportation (Non-Medical): No  Physical Activity: Not on file  Stress: Not on file  Social Connections: Unknown (11/14/2021)   Received [facility]   Social Network    Social Network: Not on file  Intimate Partner Violence: Not At Risk (06/05/2023)   Humiliation, Afraid, Rape, and Kick questionnaire    Fear of Current or Ex-Partner: No    Emotionally Abused: No    Physically Abused: No    Sexually Abused: No     Family History  Problem Relation Age of Onset   Hypertension Mother    Breast cancer Mother    Heart attack Mother    Lung cancer Father    Heart disease Sister    Diabetes Sister    Colon cancer Sister      Current Facility-Administered Medications:    acetaminophen (TYLENOL) tablet 650 mg, 650 mg, Oral, Q6H PRN **OR** acetaminophen (TYLENOL) suppository 650 mg, 650 mg, Rectal, Q6H PRN, Verdene Lennert, MD   antiseptic oral rinse (BIOTENE) solution 15 mL, 15 mL,  Topical, PRN, Jonah Blue, MD   Chlorhexidine Gluconate Cloth 2 % PADS 6 each, 6 each, Topical, Daily, Jonah Blue, MD, 6 each at 06/05/23 1940   chlorpheniramine-HYDROcodone (TUSSIONEX) 10-8 MG/5ML suspension 5 mL, 5 mL, Oral, Q12H PRN, Verdene Lennert, MD   glycopyrrolate (ROBINUL) tablet 1 mg, 1 mg, Oral, Q4H PRN **OR** glycopyrrolate (ROBINUL) injection 0.2 mg, 0.2 mg, Subcutaneous, Q4H  PRN **OR** glycopyrrolate (ROBINUL) injection 0.2 mg, 0.2 mg, Intravenous, Q4H PRN, Jonah Blue, MD   haloperidol (HALDOL) tablet 0.5 mg, 0.5 mg, Oral, Q4H PRN **OR** haloperidol (HALDOL) 2 MG/ML solution 0.5 mg, 0.5 mg, Sublingual, Q4H PRN **OR** haloperidol lactate (HALDOL) injection 0.5 mg, 0.5 mg, Intravenous, Q4H PRN, Jonah Blue, MD   [DISCONTINUED] LORazepam (ATIVAN) tablet 1 mg, 1 mg, Oral, Q4H PRN **OR** LORazepam (ATIVAN) 2 MG/ML concentrated solution 1 mg, 1 mg, Sublingual, Q4H PRN **OR** [DISCONTINUED] LORazepam (ATIVAN) injection 1 mg, 1 mg, Intravenous, Q4H PRN, Jonah Blue, MD, 1 mg at 06/17/2023 1035   LORazepam (ATIVAN) injection 0.5-1 mg, 0.5-1 mg, Intravenous, Q30 min PRN, Jonah Blue, MD   morphine (PF) 2 MG/ML injection 2 mg, 2 mg, Intravenous, Q4H PRN, Manuela Schwartz, NP, 2 mg at 06/26/2023 1610   morphine 100mg  in NS (1mg /mL) infusion - premix, 5 mg/hr, Intravenous, Continuous, Jonah Blue, MD, Last Rate: 5 mL/hr at 06/02/2023 1019, 5 mg/hr at 06/18/2023 1019   morphine bolus via infusion 2 mg, 2 mg, Intravenous, Q5 min PRN, Jonah Blue, MD   ondansetron [facility]) tablet 4 mg, 4 mg, Oral, Q6H PRN **OR** ondansetron (ZOFRAN) injection 4 mg, 4 mg, Intravenous, Q6H PRN, Verdene Lennert, MD   Oral care mouth rinse, 15 mL, Mouth Rinse, 4 times per day, Jonah Blue, MD   Oral care mouth rinse, 15 mL, Mouth Rinse, PRN, Jonah Blue, MD   sodium chloride flush (NS) 0.9 % injection 10 mL, 10 mL, Intravenous, Q12H, Janith Lima, MD, 10 mL at 06/05/23 2133   sodium  chloride flush (NS) 0.9 % injection 3 mL, 3 mL, Intravenous, Q12H, Verdene Lennert, MD, 3 mL at 06/05/23 2133  Facility-Administered Medications Ordered in Other Encounters:    sodium chloride flush (NS) 0.9 % injection 10 mL, 10 mL, Intravenous, PRN, [name], MD, 10 mL at 03/18/18 0858   Physical exam:  Vitals:   06/18/2023 0810 06/25/2023 0911 06/15/2023 1000 06/18/2023 1100  BP:  (!) 158/78 124/61   Pulse:  (!) 133 (!) 104 (!) 129  Resp:  (!) 29 (!) 33 (!) 23  Temp:  98.4 F (36.9 C)    TempSrc:  Oral    SpO2: (!) 89% (!) 82% (!) 81% (!) 74%  Weight:      Height:       Physical Exam [name]:     Head: Normocephalic.  Cardiovascular:     Rate and Rhythm: Tachycardia present.  Pulmonary:     Comments: Tachypnea. On BIPAP Abdominal:     General: There is no distension.  Skin:    Findings: No rash.       Labs    Latest Ref Rng & Units 06/07/2023    3:53 AM 06/05/2023    5:10 AM 06/04/2023    2:47 PM  CBC  WBC 4.0 - 10.5 K/uL 14.2  10.9  14.2   Hemoglobin 13.0 - 17.0 g/dL 9.9  9.5  96.0   Hematocrit 39.0 - 52.0 % 30.5  29.1  32.4   Platelets 150 - 400 K/uL 255  214  241       Latest Ref Rng & Units 06/21/2023    3:53 AM 06/05/2023    5:10 AM 06/04/2023    2:47 PM  CMP  Glucose 70 - 99 mg/dL 454  098  119   BUN 8 - 23 mg/dL 15  11  15    Creatinine 0.61 - 1.24 mg/dL 1.47  8.29  5.62  Sodium 135 - 145 mmol/L 136  135  133   Potassium 3.5 - 5.1 mmol/L 3.4  3.4  4.0   Chloride 98 - 111 mmol/L 97  100  97   CO2 22 - 32 mmol/L 26  24  25    Calcium 8.9 - 10.3 mg/dL 8.5  8.4  9.1   Total Protein 6.5 - 8.1 g/dL 6.7  6.5  6.9   Total Bilirubin <1.2 mg/dL 0.6  0.6  0.5   Alkaline Phos 38 - 126 U/L 63  62  68   AST 15 - 41 U/L 21  18  22    ALT 0 - 44 U/L 19  19  19       RADIOGRAPHIC STUDIES: I have personally reviewed the radiological images as listed and agreed with the findings in the report. DG Chest 1 View  Result Date: 06/23/2023 CLINICAL DATA:  73 year old male with  shortness of breath and weakness. Lung cancer. EXAM: CHEST  1 VIEW COMPARISON:  Portable chest 0211 hours today.  Chest CTA 06/04/2023. FINDINGS: Portable AP semi upright view at 0838 hours. Stable right Port-A-Cath, accessed. Abnormal lungs right greater than left, stable ventilation. Stable cardiac size and mediastinal contours. Lung base right pleural drain remains visible. No pneumothorax or new pulmonary opacity identified. IMPRESSION: Stable, right lung base pleural drain remains in place. No new cardiopulmonary abnormality. Electronically Signed   By: Odessa Fleming M.D.   On: 06/23/2023 08:46   DG Chest 1 View  Result Date: 06/24/2023 CLINICAL DATA:  Shortness of breath EXAM: CHEST  1 VIEW COMPARISON:  06/04/2023 FINDINGS: Unchanged position of right chest wall Port-A-Cath with tip in the upper SVC. Unchanged size of loculated right pleural effusion with tunneled pleural drain at the right lung base. Left lung is clear. IMPRESSION: Unchanged size of loculated right pleural effusion with tunneled pleural drain at the right lung base. Electronically Signed   By: Deatra Robinson M.D.   On: 06/05/2023 04:08   DG Ankle Complete Left  Result Date: 06/04/2023 CLINICAL DATA:  Status post fall 2 days ago with left ankle pain and swelling EXAM: LEFT ANKLE COMPLETE - 3 VIEW COMPARISON:  None Available. FINDINGS: There are no findings of fracture or dislocation. No joint effusion. Subtle sclerosis in the distal fibular diaphysis. Ankle mortise is intact. Soft tissues are unremarkable. IMPRESSION: 1. No acute fracture or dislocation. 2. Subtle sclerosis in the distal fibular diaphysis may reflect sequela of remote injury. Electronically Signed   By: Agustin Cree M.D.   On: 06/04/2023 20:35   CT Angio Chest PE W and/or Wo Contrast  Result Date: 06/04/2023 CLINICAL DATA:  Lung carcinoma, shortness of breath EXAM: CT ANGIOGRAPHY CHEST WITH CONTRAST TECHNIQUE: Multidetector CT imaging of the chest was performed using the  standard protocol during bolus administration of intravenous contrast. Multiplanar CT image reconstructions and MIPs were obtained to evaluate the vascular anatomy. RADIATION DOSE REDUCTION: This exam was performed according to the departmental dose-optimization program which includes automated exposure control, adjustment of the mA and/or kV according to patient size and/or use of iterative reconstruction technique. CONTRAST:  75mL OMNIPAQUE IOHEXOL 350 MG/ML SOLN COMPARISON:  05/08/2023 FINDINGS: Cardiovascular: Right IJ port catheter loops in the IJ tip in the right brachiocephalic vein as before. SVC patent. Heart size normal. Small pericardial effusion. Satisfactory opacification of pulmonary arteries noted, and there is no evidence of pulmonary emboli. Scattered left coronary calcifications. Adequate contrast opacification of the thoracic aorta with no evidence of dissection, aneurysm,  or stenosis. There is scattered plaque in the arch and descending thoracic aorta. Classic 3-vessel brachiocephalic arch anatomy without proximal stenosis. Mediastinum/Nodes: Right perihilar consolidation extending around the distal right mainstem bronchus with mild narrowing. Anterior pericardial lymph nodes measure up to 1.1 cm short axis diameter, stable. Stable 8 mm AP window node. New 1.1 cm left hilar adenopathy. New discontinuity of the mainstem bronchus along its distal right aspect just above the carina. Contained extraluminal gas medial to the consolidated right upper lung. Lungs/Pleura: Tunneled right pleural drain catheter at the lung base with relatively stable loculated basilar effusion. Increasing airspace consolidation in apical and posterior segments right upper lung. Pulmonary emphysema. Stable 2.7 cm pleural based consolidation in the superior segment left lower lobe. Upper Abdomen: No adrenal mass.  No acute findings. Musculoskeletal: Vertebral endplate spurring at multiple levels in the lower thoracic spine.  Stable sclerosis in the T10 vertebral body. Review of the MIP images confirms the above findings. IMPRESSION: 1. Negative for acute PE or thoracic aortic dissection. 2. Increasing airspace consolidation in the right upper lung with new discontinuity of the distal right mainstem bronchus and contained extraluminal gas medial to the consolidated right upper lung. 3. Stable loculated right pleural effusion with tunneled pleural drain catheter in place. 4. New 1.1 cm left hilar adenopathy. 5. Aortic Atherosclerosis (ICD10-I70.0) and Emphysema (ICD10-J43.9). Electronically Signed   By: Corlis Leak M.D.   On: 06/04/2023 18:44   DG Chest 2 View  Result Date: 06/04/2023 CLINICAL DATA:  Weakness. Chest pain. 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 EXAM: CHEST - 2 VIEW COMPARISON:  Chest radiograph dated May 26, 2023. FINDINGS: Right internal jugular Port-A-Cath with tip projecting over the proximal SVC. Similar position of a tunneled pleural drainage catheter at the right lung base. The loculated right pleural effusion is not significantly changed in volume compared to the prior exam. Similar right apical opacification. Slightly increased airspace opacities in the right upper lung. No focal consolidation in the left lung. Visualized osseous structures are unchanged. IMPRESSION: 1. Similar loculated right pleural effusion with tunneled pleural drainage catheter at the right lung base. 2. Mildly increased airspace opacities in the right upper lung. 3. Similar right apical opacification. Electronically Signed   By: Hart Robinsons M.D.   On: 06/04/2023 15:48   DG Chest 2 View  Result Date: 05/26/2023 CLINICAL DATA:  CHF. 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 EXAM: CHEST - 2 VIEW COMPARISON:  05/25/23 FINDINGS: There is a right IJ porta catheter with tip in the projection of the proximal SVC. The tunneled pleural drainage catheter is again  noted overlying the right lung base. The loculated right pleural effusion appears mildly increased in volume compared with the prior examination. Unchanged right apical opacification. Airspace opacities within the right upper lung is again noted in appears increased from the previous exam. Left lung is clear. Osseous structures appear intact. IMPRESSION: 1. Mild increase in volume of loculated right pleural effusion with tunneled pleural drainage catheter in place. 2. Increased airspace opacities within the right upper lung. 3. Unchanged right apical opacification. Electronically Signed   By: Signa Kell M.D.   On: 05/26/2023 07:59   DG Chest Portable 1 View  Result Date: 05/25/2023 CLINICAL DATA:  Shortness of breath.  Lung cancer. EXAM: PORTABLE CHEST 1 VIEW COMPARISON:  Chest CT 05/08/2023.  Chest x-ray 03/08/2023. FINDINGS: Similar volume loss right hemithorax. Airspace disease noted right upper lung compatible with the consolidative disease  seen on chest CT. Asymmetric elevation right hemidiaphragm. Small right pleural effusion with pleural drain identified at the right lung base. Opacity in the right apex compatible with loculated fluid and lung collapse as seen on the previous CT scan. Right Port-A-Cath again noted with tip position probably in the low right innominate vein. Left lung clear. Cardiopericardial silhouette is at upper limits of normal for size. Telemetry leads overlie the chest. IMPRESSION: 1. Similar volume loss right hemithorax with airspace disease right upper lung compatible with the consolidative disease seen on chest CT. 2. Small right pleural effusion with pleural drain at the right lung base. 3. Opacity in the right apex compatible with loculated fluid and lung collapse as seen on the previous CT scan. Electronically Signed   By: Kennith Center M.D.   On: 05/25/2023 14:22   CT HEAD WO CONTRAST ( )  Result Date: 05/21/2023 CLINICAL DATA:  Status post fall. Dizziness. On  blood thinners. History of lung cancer. EXAM: CT HEAD WITHOUT CONTRAST TECHNIQUE: Contiguous axial images were obtained from the base of the skull through the vertex without intravenous contrast. RADIATION DOSE REDUCTION: This exam was performed according to the departmental dose-optimization program which includes automated exposure control, adjustment of the mA and/or kV according to patient size and/or use of iterative reconstruction technique. COMPARISON:  Head MRI 12/05/2022 FINDINGS: Brain: There is no evidence of an acute infarct, intracranial hemorrhage, mass, midline shift, or extra-axial fluid collection. Slight cerebral atrophy is within normal limits for age. 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 Cerebral white matter hypodensities are nonspecific but compatible with minimal chronic small vessel ischemic disease. Vascular: No hyperdense vessel. Skull: No acute fracture or suspicious osseous lesion. Sinuses/Orbits: Minimal fluid in the left sphenoid sinus. Clear mastoid air cells. Left cataract extraction. Other: None. IMPRESSION: No evidence of acute intracranial abnormality. Electronically Signed   By: Sebastian Ache M.D.   On: 05/21/2023 13:18   CT CHEST ABDOMEN PELVIS W CONTRAST  Result Date: 05/10/2023 CLINICAL DATA:  Right lung cancer restaging * Tracking Code: BO * EXAM: CT CHEST, ABDOMEN, AND PELVIS WITH CONTRAST TECHNIQUE: Multidetector CT imaging of the chest, abdomen and pelvis was performed following the standard protocol during bolus administration of intravenous contrast. RADIATION DOSE REDUCTION: This exam was performed according to the departmental dose-optimization program which includes automated exposure control, adjustment of the mA and/or kV according to patient size and/or use of iterative reconstruction technique. CONTRAST:  80mL OMNIPAQUE IOHEXOL 300 MG/ML  SOLN COMPARISON:  02/18/2023 (chest) and 11/16/2022 (abdomen/pelvis). FINDINGS: CT CHEST FINDINGS  Cardiovascular: The Port-A-Cath catheter appears to loop up into the right internal jugular vein and extends caudad to terminate in the right brachiocephalic vein, similar to prior exams including 02/01/2023. Atheromatous vascular calcification of the thoracic aorta. Stable small pericardial effusion. Mediastinum/Nodes: Persistent hazy density around the right mainstem bronchus, without a discretely measurable lymph node or lesion in this vicinity. Lymph nodes just outside of the pericardium in the mediastinal adipose tissue measure up to 1.4 cm in short axis on image 47 series 2, previously 0.9 cm. There is some high density nodularity along the right anterior pericardium for example on image 46 series 2, possibly from pericardial lesion or adjacent lymph node. Previously hypermetabolic AP window lymph node 0.6 cm in short axis on image 28 series 2, formerly 0.8 cm on 02/18/2023. Lungs/Pleura: Collapsed and partially consolidated right upper lobe, hypodense medial portion measures about 1.7 cm in thickness on image 25 series 2, formerly  about 2.2 cm in thickness. This hypodense portion the may represent original tumor but is indistinctly marginated against the atelectatic lung. The right upper lobe bronchus is truncated. Reduced size right pleural effusion with a right pleural drainage catheter in place. Currently the effusion is small to moderate. Notable enhancement along the parietal pleura. Trace gas along the pleural space at the lung base. Emphysema. Previously hypermetabolic posterior consolidation in the superior segment right lower lobe and posteriorly in the right middle lobe, this along was previously atelectatic. Current appearance could be from pneumonia or aspiration pneumonitis, versus prior radiation therapy. Scattered pleural nodularity anteriorly along the right middle lobe, small pleural deposits of tumor not excluded. Subpleural nodularity in the right lower lobe observed, index lesion 5 mm in  diameter on image 117 series 4. Stable left apical pleuroparenchymal scarring. A 3.0 by 2.0 cm masslike consolidation in the superior segment left lower lobe previously measured 2.7 by 1.9 cm by my measurements. Accordingly this is mildly enlarged. Musculoskeletal: Lower thoracic spondylosis. Probable sebaceous cyst or similar benign lesion along the junction of the right posterior neck and chest, 1.2 cm in short axis on image 1 series 2, previously not hypermetabolic. 0.9 cm sclerotic left eleventh rib lesion medially on image 57 series 2, not previously hypermetabolic. Probably a bone island. CT ABDOMEN PELVIS FINDINGS Hepatobiliary: Contracted gallbladder.  Otherwise unremarkable. Pancreas: Unremarkable Spleen: Unremarkable Adrenals/Urinary Tract: Fullness of both adrenal glands without a discrete mass. The kidneys appear unremarkable. Stomach/Bowel: Scattered colonic diverticula. Vascular/Lymphatic: Small right gastric node 0.7 cm in short axis on image 65 series 2 Atherosclerosis is present, including aortoiliac atherosclerotic disease. Reproductive: Prominent median lobe of the prostate gland indents the bladder base. Other: Unremarkable Musculoskeletal: Left-sided lipoma interposed between the gluteus maximus and the greater trochanter. Hemangioma eccentric to the left in the L2 vertebral body, image 78 series 2. Probable bone island in the right iliac bone. Chronic avascular necrosis of both femoral heads. Degenerative endplate sclerosis inferiorly along the L3 endplate with a sclerotic lesion eccentric to the left in the L3 vertebral body which is nonspecific but not previously hypermetabolic. Probable superficial sebaceous cyst along the right upper thigh/groin region, 1.2 cm in short axis on image 132 series 2, present and not previously hypermetabolic PET-CT 12/03/2022. IMPRESSION: 1. Reduced size of the large right upper lobe central tumor, currently 1.7 cm in transverse thickness and previously 2.2 cm.  2. Reduced size right pleural effusion with a right pleural drainage catheter in place. Currently the effusion is small to moderate. Enhancing right pleural metastatic foci. Trace gas along the pleural space at the lung base. 3. Airspace opacity in the superior segment right lower lobe and posteriorly in the right middle lobe, this along was previously atelectatic. Pneumonia not excluded. 4. Subpleural nodularity in the right lower lobe and right middle lobe observed, index lesion 5 mm in diameter. 5. Mild enlargement of a masslike consolidation in the superior segment left lower lobe, currently 3.0 by 2.0 cm, previously 2.7 by 1.9 cm. 6. Persistent hazy density around the right mainstem bronchus, without a discretely measurable lymph node or lesion in this vicinity. 7. Lymph nodes just outside of the pericardium in the mediastinal adipose tissue measure up to 1.4 cm in short axis, previously 0.9 cm. There is some high density nodularity along the right anterior pericardium for example, possibly from pericardial lesion or adjacent lymph node. 8. Stable small pericardial effusion. 9. Emphysema. 10. Chronic avascular necrosis of both femoral heads. 11. Prominent median lobe  of the prostate gland indents the bladder base. 12. Scattered colonic diverticula. 13. Aortic atherosclerosis. Aortic Atherosclerosis (ICD10-I70.0) and Emphysema (ICD10-J43.9). Electronically Signed   By: Gaylyn Rong M.D.   On: 05/10/2023 08:47    Assessment and plan-   Acute respiratory failure - multifactorial, from obstruction pneumonia, COPD exacerbation, Stage IV lung cancer and possible cardiac issue -CHF/Afib-flutter.  Currently on BIPAP. Patient is DNR DNI I reviewed today's CXR and labs. Discussed with his family members [2 daughters and 1 son] at bedside. They are aware that patient is critically ill. Given the overall poor prognosis, patient and family have agreed with comfort care/hospice.  All questions were answered.  Emotional support provided.  Transition to hospice.  Thank you for allowing me to participate in the care of this patient.   [name], MD, PhD Hematology Oncology 06/21/2023

## 2023-07-03 DEATH — deceased

## 2023-07-04 ENCOUNTER — Ambulatory Visit: Payer: Medicare Other

## 2023-07-04 ENCOUNTER — Ambulatory Visit: Payer: Medicare Other | Admitting: Oncology

## 2023-07-04 ENCOUNTER — Other Ambulatory Visit: Payer: Medicare Other

## 2023-07-09 ENCOUNTER — Other Ambulatory Visit: Payer: Medicare Other

## 2023-07-09 ENCOUNTER — Ambulatory Visit: Payer: Medicare Other

## 2023-07-09 ENCOUNTER — Ambulatory Visit: Payer: Medicare Other | Admitting: Oncology

## 2023-07-25 ENCOUNTER — Ambulatory Visit: Payer: Medicare Other

## 2023-07-25 ENCOUNTER — Other Ambulatory Visit: Payer: Medicare Other

## 2023-07-25 ENCOUNTER — Ambulatory Visit: Payer: Medicare Other | Admitting: Oncology

## 2023-08-08 ENCOUNTER — Ambulatory Visit: Payer: Medicare Other | Admitting: Radiation Oncology
